# Patient Record
Sex: Female | Born: 1950 | Race: Black or African American | Hispanic: No | Smoking: Former smoker
Health system: Southern US, Community
[De-identification: ages and names within clinical notes are randomized; demographics above are authoritative.]

## PROBLEM LIST (undated history)

## (undated) DIAGNOSIS — E119 Type 2 diabetes mellitus without complications: Secondary | ICD-10-CM

## (undated) DIAGNOSIS — Z5189 Encounter for other specified aftercare: Secondary | ICD-10-CM

## (undated) DIAGNOSIS — Z8041 Family history of malignant neoplasm of ovary: Secondary | ICD-10-CM

## (undated) DIAGNOSIS — Z789 Other specified health status: Secondary | ICD-10-CM

## (undated) DIAGNOSIS — C50919 Malignant neoplasm of unspecified site of unspecified female breast: Secondary | ICD-10-CM

## (undated) DIAGNOSIS — Z800 Family history of malignant neoplasm of digestive organs: Secondary | ICD-10-CM

## (undated) DIAGNOSIS — I10 Essential (primary) hypertension: Secondary | ICD-10-CM

## (undated) HISTORY — PX: TUBAL LIGATION: SHX77

## (undated) HISTORY — DX: Encounter for other specified aftercare: Z51.89

## (undated) HISTORY — DX: Other specified health status: Z78.9

## (undated) HISTORY — DX: Essential (primary) hypertension: I10

## (undated) HISTORY — DX: Family history of malignant neoplasm of ovary: Z80.41

## (undated) HISTORY — DX: Family history of malignant neoplasm of digestive organs: Z80.0

## (undated) HISTORY — DX: Malignant neoplasm of unspecified site of unspecified female breast: C50.919

## (undated) HISTORY — PX: COLONOSCOPY: SHX174

## (undated) HISTORY — DX: Type 2 diabetes mellitus without complications: E11.9

---

## 1998-11-30 HISTORY — PX: FOOT SURGERY: SHX648

## 1999-01-28 ENCOUNTER — Other Ambulatory Visit: Admission: RE | Admit: 1999-01-28 | Discharge: 1999-01-28 | Payer: Self-pay | Admitting: Family Medicine

## 1999-04-14 ENCOUNTER — Ambulatory Visit (HOSPITAL_COMMUNITY): Admission: RE | Admit: 1999-04-14 | Discharge: 1999-04-14 | Payer: Self-pay | Admitting: Family Medicine

## 1999-04-14 ENCOUNTER — Encounter: Payer: Self-pay | Admitting: Family Medicine

## 2000-04-20 ENCOUNTER — Encounter: Payer: Self-pay | Admitting: Family Medicine

## 2000-04-20 ENCOUNTER — Ambulatory Visit (HOSPITAL_COMMUNITY): Admission: RE | Admit: 2000-04-20 | Discharge: 2000-04-20 | Payer: Self-pay | Admitting: Family Medicine

## 2000-07-08 ENCOUNTER — Other Ambulatory Visit: Admission: RE | Admit: 2000-07-08 | Discharge: 2000-07-08 | Payer: Self-pay | Admitting: Family Medicine

## 2001-06-30 DIAGNOSIS — C50919 Malignant neoplasm of unspecified site of unspecified female breast: Secondary | ICD-10-CM

## 2001-06-30 HISTORY — DX: Malignant neoplasm of unspecified site of unspecified female breast: C50.919

## 2001-09-16 ENCOUNTER — Encounter: Payer: Self-pay | Admitting: Family Medicine

## 2001-09-16 ENCOUNTER — Ambulatory Visit (HOSPITAL_COMMUNITY): Admission: RE | Admit: 2001-09-16 | Discharge: 2001-09-16 | Payer: Self-pay | Admitting: Family Medicine

## 2001-11-30 HISTORY — PX: BREAST SURGERY: SHX581

## 2002-08-30 ENCOUNTER — Other Ambulatory Visit: Admission: RE | Admit: 2002-08-30 | Discharge: 2002-08-30 | Payer: Self-pay | Admitting: Radiology

## 2002-08-30 ENCOUNTER — Encounter: Admission: RE | Admit: 2002-08-30 | Discharge: 2002-08-30 | Payer: Self-pay | Admitting: Internal Medicine

## 2002-08-30 ENCOUNTER — Encounter: Payer: Self-pay | Admitting: Internal Medicine

## 2002-08-30 ENCOUNTER — Encounter (INDEPENDENT_AMBULATORY_CARE_PROVIDER_SITE_OTHER): Payer: Self-pay | Admitting: Specialist

## 2002-09-08 ENCOUNTER — Ambulatory Visit (HOSPITAL_BASED_OUTPATIENT_CLINIC_OR_DEPARTMENT_OTHER): Admission: RE | Admit: 2002-09-08 | Discharge: 2002-09-08 | Payer: Self-pay | Admitting: *Deleted

## 2002-09-11 ENCOUNTER — Encounter: Payer: Self-pay | Admitting: *Deleted

## 2002-09-11 ENCOUNTER — Ambulatory Visit (HOSPITAL_COMMUNITY): Admission: RE | Admit: 2002-09-11 | Discharge: 2002-09-11 | Payer: Self-pay | Admitting: *Deleted

## 2002-09-12 ENCOUNTER — Ambulatory Visit (HOSPITAL_COMMUNITY): Admission: RE | Admit: 2002-09-12 | Discharge: 2002-09-12 | Payer: Self-pay | Admitting: *Deleted

## 2002-09-12 ENCOUNTER — Encounter: Payer: Self-pay | Admitting: *Deleted

## 2002-09-14 ENCOUNTER — Encounter: Payer: Self-pay | Admitting: *Deleted

## 2002-09-14 ENCOUNTER — Ambulatory Visit (HOSPITAL_COMMUNITY): Admission: RE | Admit: 2002-09-14 | Discharge: 2002-09-14 | Payer: Self-pay | Admitting: *Deleted

## 2002-10-19 ENCOUNTER — Encounter: Payer: Self-pay | Admitting: *Deleted

## 2002-10-19 ENCOUNTER — Encounter: Admission: RE | Admit: 2002-10-19 | Discharge: 2002-10-19 | Payer: Self-pay | Admitting: *Deleted

## 2003-01-10 ENCOUNTER — Encounter: Admission: RE | Admit: 2003-01-10 | Discharge: 2003-01-10 | Payer: Self-pay | Admitting: *Deleted

## 2003-01-10 ENCOUNTER — Ambulatory Visit (HOSPITAL_BASED_OUTPATIENT_CLINIC_OR_DEPARTMENT_OTHER): Admission: RE | Admit: 2003-01-10 | Discharge: 2003-01-11 | Payer: Self-pay | Admitting: *Deleted

## 2003-01-10 ENCOUNTER — Encounter (INDEPENDENT_AMBULATORY_CARE_PROVIDER_SITE_OTHER): Payer: Self-pay | Admitting: Specialist

## 2003-01-26 ENCOUNTER — Ambulatory Visit: Admission: RE | Admit: 2003-01-26 | Discharge: 2003-02-12 | Payer: Self-pay | Admitting: Radiation Oncology

## 2003-02-23 ENCOUNTER — Ambulatory Visit: Admission: RE | Admit: 2003-02-23 | Discharge: 2003-04-25 | Payer: Self-pay | Admitting: Family Medicine

## 2003-05-10 ENCOUNTER — Encounter: Admission: RE | Admit: 2003-05-10 | Discharge: 2003-05-10 | Payer: Self-pay | Admitting: *Deleted

## 2003-05-31 ENCOUNTER — Encounter: Admission: RE | Admit: 2003-05-31 | Discharge: 2003-08-29 | Payer: Self-pay | Admitting: Radiation Oncology

## 2003-05-31 ENCOUNTER — Ambulatory Visit: Admission: RE | Admit: 2003-05-31 | Discharge: 2003-05-31 | Payer: Self-pay | Admitting: Radiation Oncology

## 2003-09-04 ENCOUNTER — Encounter: Payer: Self-pay | Admitting: Oncology

## 2003-09-04 ENCOUNTER — Encounter: Admission: RE | Admit: 2003-09-04 | Discharge: 2003-09-04 | Payer: Self-pay | Admitting: Oncology

## 2003-12-13 ENCOUNTER — Ambulatory Visit (HOSPITAL_COMMUNITY): Admission: RE | Admit: 2003-12-13 | Discharge: 2003-12-13 | Payer: Self-pay | Admitting: Oncology

## 2003-12-14 ENCOUNTER — Ambulatory Visit (HOSPITAL_COMMUNITY): Admission: RE | Admit: 2003-12-14 | Discharge: 2003-12-14 | Payer: Self-pay | Admitting: Oncology

## 2004-01-03 ENCOUNTER — Ambulatory Visit: Admission: RE | Admit: 2004-01-03 | Discharge: 2004-01-03 | Payer: Self-pay | Admitting: Radiation Oncology

## 2004-04-08 ENCOUNTER — Encounter: Admission: RE | Admit: 2004-04-08 | Discharge: 2004-04-08 | Payer: Self-pay | Admitting: Oncology

## 2004-04-09 ENCOUNTER — Ambulatory Visit (HOSPITAL_COMMUNITY): Admission: RE | Admit: 2004-04-09 | Discharge: 2004-04-09 | Payer: Self-pay | Admitting: Oncology

## 2004-04-24 ENCOUNTER — Emergency Department (HOSPITAL_COMMUNITY): Admission: EM | Admit: 2004-04-24 | Discharge: 2004-04-24 | Payer: Self-pay | Admitting: Emergency Medicine

## 2004-08-22 ENCOUNTER — Ambulatory Visit (HOSPITAL_BASED_OUTPATIENT_CLINIC_OR_DEPARTMENT_OTHER): Admission: RE | Admit: 2004-08-22 | Discharge: 2004-08-22 | Payer: Self-pay | Admitting: *Deleted

## 2004-09-18 ENCOUNTER — Encounter: Admission: RE | Admit: 2004-09-18 | Discharge: 2004-09-18 | Payer: Self-pay | Admitting: Oncology

## 2004-10-04 ENCOUNTER — Ambulatory Visit: Payer: Self-pay | Admitting: Oncology

## 2004-12-29 ENCOUNTER — Ambulatory Visit (HOSPITAL_COMMUNITY): Admission: RE | Admit: 2004-12-29 | Discharge: 2004-12-29 | Payer: Self-pay | Admitting: Oncology

## 2004-12-29 ENCOUNTER — Ambulatory Visit: Payer: Self-pay | Admitting: Oncology

## 2005-06-10 ENCOUNTER — Ambulatory Visit: Payer: Self-pay | Admitting: Hematology and Oncology

## 2005-09-02 ENCOUNTER — Ambulatory Visit: Payer: Self-pay | Admitting: Hematology and Oncology

## 2006-02-03 ENCOUNTER — Encounter: Admission: RE | Admit: 2006-02-03 | Discharge: 2006-02-03 | Payer: Self-pay | Admitting: Internal Medicine

## 2006-03-10 ENCOUNTER — Ambulatory Visit: Payer: Self-pay | Admitting: Hematology and Oncology

## 2006-03-17 LAB — CBC WITH DIFFERENTIAL/PLATELET
BASO%: 0.5 % (ref 0.0–2.0)
Basophils Absolute: 0 10*3/uL (ref 0.0–0.1)
EOS%: 3.3 % (ref 0.0–7.0)
Eosinophils Absolute: 0.3 10*3/uL (ref 0.0–0.5)
HCT: 40.6 % (ref 34.8–46.6)
HGB: 13.7 g/dL (ref 11.6–15.9)
LYMPH%: 33.2 % (ref 14.0–48.0)
MCH: 33.3 pg (ref 26.0–34.0)
MCHC: 33.8 g/dL (ref 32.0–36.0)
MCV: 98.5 fL (ref 81.0–101.0)
MONO#: 0.5 10*3/uL (ref 0.1–0.9)
MONO%: 6.3 % (ref 0.0–13.0)
NEUT#: 5 10*3/uL (ref 1.5–6.5)
NEUT%: 56.7 % (ref 39.6–76.8)
Platelets: 396 10*3/uL (ref 145–400)
RBC: 4.12 10*6/uL (ref 3.70–5.32)
RDW: 12.7 % (ref 11.3–14.5)
WBC: 8.8 10*3/uL (ref 3.9–10.0)
lymph#: 2.9 10*3/uL (ref 0.9–3.3)

## 2006-03-19 LAB — COMPREHENSIVE METABOLIC PANEL
ALT: 64 U/L — ABNORMAL HIGH (ref 0–40)
AST: 64 U/L — ABNORMAL HIGH (ref 0–37)
Albumin: 4.2 g/dL (ref 3.5–5.2)
Alkaline Phosphatase: 80 U/L (ref 39–117)
BUN: 9 mg/dL (ref 6–23)
CO2: 26 mEq/L (ref 19–32)
Calcium: 9.4 mg/dL (ref 8.4–10.5)
Chloride: 103 mEq/L (ref 96–112)
Creatinine, Ser: 0.7 mg/dL (ref 0.4–1.2)
Glucose, Bld: 182 mg/dL — ABNORMAL HIGH (ref 70–99)
Potassium: 3.9 mEq/L (ref 3.5–5.3)
Sodium: 139 mEq/L (ref 135–145)
Total Bilirubin: 0.6 mg/dL (ref 0.3–1.2)
Total Protein: 8 g/dL (ref 6.0–8.3)

## 2006-03-19 LAB — CANCER ANTIGEN 27.29: CA 27.29: 10 U/mL (ref 0–39)

## 2006-03-19 LAB — LACTATE DEHYDROGENASE: LDH: 219 U/L (ref 94–250)

## 2006-09-13 ENCOUNTER — Ambulatory Visit: Payer: Self-pay | Admitting: Hematology and Oncology

## 2006-09-15 ENCOUNTER — Ambulatory Visit (HOSPITAL_COMMUNITY): Admission: RE | Admit: 2006-09-15 | Discharge: 2006-09-15 | Payer: Self-pay | Admitting: Hematology and Oncology

## 2006-12-09 ENCOUNTER — Encounter: Admission: RE | Admit: 2006-12-09 | Discharge: 2006-12-09 | Payer: Self-pay | Admitting: Hematology and Oncology

## 2006-12-17 ENCOUNTER — Encounter: Admission: RE | Admit: 2006-12-17 | Discharge: 2006-12-17 | Payer: Self-pay | Admitting: Hematology and Oncology

## 2007-03-07 ENCOUNTER — Ambulatory Visit: Payer: Self-pay | Admitting: Hematology and Oncology

## 2007-03-18 LAB — CBC WITH DIFFERENTIAL/PLATELET
BASO%: 0.5 % (ref 0.0–2.0)
Basophils Absolute: 0 10*3/uL (ref 0.0–0.1)
EOS%: 2.5 % (ref 0.0–7.0)
Eosinophils Absolute: 0.3 10*3/uL (ref 0.0–0.5)
HCT: 38.2 % (ref 34.8–46.6)
HGB: 13.4 g/dL (ref 11.6–15.9)
LYMPH%: 31.4 % (ref 14.0–48.0)
MCH: 33.9 pg (ref 26.0–34.0)
MCHC: 35.1 g/dL (ref 32.0–36.0)
MCV: 96.4 fL (ref 81.0–101.0)
MONO#: 0.6 10*3/uL (ref 0.1–0.9)
MONO%: 5.6 % (ref 0.0–13.0)
NEUT#: 6 10*3/uL (ref 1.5–6.5)
NEUT%: 60 % (ref 39.6–76.8)
Platelets: 407 10*3/uL — ABNORMAL HIGH (ref 145–400)
RBC: 3.96 10*6/uL (ref 3.70–5.32)
RDW: 12.7 % (ref 11.3–14.5)
WBC: 10 10*3/uL (ref 3.9–10.0)
lymph#: 3.1 10*3/uL (ref 0.9–3.3)

## 2007-03-18 LAB — COMPREHENSIVE METABOLIC PANEL
ALT: 22 U/L (ref 0–35)
AST: 19 U/L (ref 0–37)
Albumin: 4 g/dL (ref 3.5–5.2)
Alkaline Phosphatase: 68 U/L (ref 39–117)
BUN: 13 mg/dL (ref 6–23)
CO2: 27 mEq/L (ref 19–32)
Calcium: 9.1 mg/dL (ref 8.4–10.5)
Chloride: 103 mEq/L (ref 96–112)
Creatinine, Ser: 0.74 mg/dL (ref 0.40–1.20)
Glucose, Bld: 84 mg/dL (ref 70–99)
Potassium: 3.8 mEq/L (ref 3.5–5.3)
Sodium: 142 mEq/L (ref 135–145)
Total Bilirubin: 0.4 mg/dL (ref 0.3–1.2)
Total Protein: 7.3 g/dL (ref 6.0–8.3)

## 2007-03-18 LAB — LACTATE DEHYDROGENASE: LDH: 162 U/L (ref 94–250)

## 2007-03-18 LAB — CANCER ANTIGEN 27.29: CA 27.29: 12 U/mL (ref 0–39)

## 2007-09-13 ENCOUNTER — Ambulatory Visit: Payer: Self-pay | Admitting: Hematology and Oncology

## 2007-09-23 LAB — COMPREHENSIVE METABOLIC PANEL
ALT: 24 U/L (ref 0–35)
AST: 23 U/L (ref 0–37)
Albumin: 4.4 g/dL (ref 3.5–5.2)
Alkaline Phosphatase: 67 U/L (ref 39–117)
BUN: 12 mg/dL (ref 6–23)
CO2: 25 mEq/L (ref 19–32)
Calcium: 9.1 mg/dL (ref 8.4–10.5)
Chloride: 104 mEq/L (ref 96–112)
Creatinine, Ser: 0.71 mg/dL (ref 0.40–1.20)
Glucose, Bld: 115 mg/dL — ABNORMAL HIGH (ref 70–99)
Potassium: 3.6 mEq/L (ref 3.5–5.3)
Sodium: 140 mEq/L (ref 135–145)
Total Bilirubin: 0.5 mg/dL (ref 0.3–1.2)
Total Protein: 7.8 g/dL (ref 6.0–8.3)

## 2007-09-23 LAB — CBC WITH DIFFERENTIAL/PLATELET
BASO%: 0.5 % (ref 0.0–2.0)
Basophils Absolute: 0 10*3/uL (ref 0.0–0.1)
EOS%: 2 % (ref 0.0–7.0)
Eosinophils Absolute: 0.2 10*3/uL (ref 0.0–0.5)
HCT: 38.3 % (ref 34.8–46.6)
HGB: 13.1 g/dL (ref 11.6–15.9)
LYMPH%: 31.6 % (ref 14.0–48.0)
MCH: 33 pg (ref 26.0–34.0)
MCHC: 34.3 g/dL (ref 32.0–36.0)
MCV: 96.3 fL (ref 81.0–101.0)
MONO#: 0.5 10*3/uL (ref 0.1–0.9)
MONO%: 5.5 % (ref 0.0–13.0)
NEUT#: 5.7 10*3/uL (ref 1.5–6.5)
NEUT%: 60.4 % (ref 39.6–76.8)
Platelets: 435 10*3/uL — ABNORMAL HIGH (ref 145–400)
RBC: 3.98 10*6/uL (ref 3.70–5.32)
RDW: 13.3 % (ref 11.3–14.5)
WBC: 9.4 10*3/uL (ref 3.9–10.0)
lymph#: 3 10*3/uL (ref 0.9–3.3)

## 2007-09-23 LAB — LACTATE DEHYDROGENASE: LDH: 184 U/L (ref 94–250)

## 2007-09-23 LAB — CANCER ANTIGEN 27.29: CA 27.29: 10 U/mL (ref 0–39)

## 2007-12-13 ENCOUNTER — Encounter: Admission: RE | Admit: 2007-12-13 | Discharge: 2007-12-13 | Payer: Self-pay | Admitting: Internal Medicine

## 2007-12-16 ENCOUNTER — Encounter (INDEPENDENT_AMBULATORY_CARE_PROVIDER_SITE_OTHER): Payer: Self-pay | Admitting: Diagnostic Radiology

## 2007-12-16 ENCOUNTER — Encounter: Admission: RE | Admit: 2007-12-16 | Discharge: 2007-12-16 | Payer: Self-pay | Admitting: Internal Medicine

## 2008-03-14 ENCOUNTER — Ambulatory Visit: Payer: Self-pay | Admitting: Hematology and Oncology

## 2008-03-16 LAB — COMPREHENSIVE METABOLIC PANEL
ALT: 25 U/L (ref 0–35)
AST: 23 U/L (ref 0–37)
Albumin: 4.1 g/dL (ref 3.5–5.2)
Alkaline Phosphatase: 60 U/L (ref 39–117)
BUN: 14 mg/dL (ref 6–23)
CO2: 26 mEq/L (ref 19–32)
Calcium: 9.1 mg/dL (ref 8.4–10.5)
Chloride: 106 mEq/L (ref 96–112)
Creatinine, Ser: 0.75 mg/dL (ref 0.40–1.20)
Glucose, Bld: 96 mg/dL (ref 70–99)
Potassium: 4 mEq/L (ref 3.5–5.3)
Sodium: 142 mEq/L (ref 135–145)
Total Bilirubin: 0.6 mg/dL (ref 0.3–1.2)
Total Protein: 7.6 g/dL (ref 6.0–8.3)

## 2008-03-16 LAB — CBC WITH DIFFERENTIAL/PLATELET
BASO%: 0 % (ref 0.0–2.0)
Basophils Absolute: 0 10*3/uL (ref 0.0–0.1)
EOS%: 2.9 % (ref 0.0–7.0)
Eosinophils Absolute: 0.2 10*3/uL (ref 0.0–0.5)
HCT: 38.5 % (ref 34.8–46.6)
HGB: 13.3 g/dL (ref 11.6–15.9)
LYMPH%: 33.7 % (ref 14.0–48.0)
MCH: 33 pg (ref 26.0–34.0)
MCHC: 34.5 g/dL (ref 32.0–36.0)
MCV: 95.7 fL (ref 81.0–101.0)
MONO#: 0.4 10*3/uL (ref 0.1–0.9)
MONO%: 4.7 % (ref 0.0–13.0)
NEUT#: 4.7 10*3/uL (ref 1.5–6.5)
NEUT%: 58.7 % (ref 39.6–76.8)
Platelets: 394 10*3/uL (ref 145–400)
RBC: 4.03 10*6/uL (ref 3.70–5.32)
RDW: 12.8 % (ref 11.3–14.5)
WBC: 7.9 10*3/uL (ref 3.9–10.0)
lymph#: 2.7 10*3/uL (ref 0.9–3.3)

## 2008-03-16 LAB — LACTATE DEHYDROGENASE: LDH: 169 U/L (ref 94–250)

## 2008-03-16 LAB — CANCER ANTIGEN 27.29: CA 27.29: 15 U/mL (ref 0–39)

## 2008-09-14 ENCOUNTER — Ambulatory Visit: Payer: Self-pay | Admitting: Hematology and Oncology

## 2008-09-18 ENCOUNTER — Ambulatory Visit (HOSPITAL_COMMUNITY): Admission: RE | Admit: 2008-09-18 | Discharge: 2008-09-18 | Payer: Self-pay | Admitting: Hematology and Oncology

## 2008-09-28 LAB — CBC WITH DIFFERENTIAL/PLATELET
BASO%: 0.4 % (ref 0.0–2.0)
Basophils Absolute: 0 10*3/uL (ref 0.0–0.1)
EOS%: 3.6 % (ref 0.0–7.0)
Eosinophils Absolute: 0.3 10*3/uL (ref 0.0–0.5)
HCT: 37.8 % (ref 34.8–46.6)
HGB: 12.8 g/dL (ref 11.6–15.9)
LYMPH%: 34.8 % (ref 14.0–48.0)
MCH: 32.7 pg (ref 26.0–34.0)
MCHC: 33.8 g/dL (ref 32.0–36.0)
MCV: 96.7 fL (ref 81.0–101.0)
MONO#: 0.5 10*3/uL (ref 0.1–0.9)
MONO%: 6.6 % (ref 0.0–13.0)
NEUT#: 4.5 10*3/uL (ref 1.5–6.5)
NEUT%: 54.6 % (ref 39.6–76.8)
Platelets: 360 10*3/uL (ref 145–400)
RBC: 3.91 10*6/uL (ref 3.70–5.32)
RDW: 13.1 % (ref 11.3–14.5)
WBC: 8.2 10*3/uL (ref 3.9–10.0)
lymph#: 2.8 10*3/uL (ref 0.9–3.3)

## 2008-09-28 LAB — CANCER ANTIGEN 27.29: CA 27.29: 12 U/mL (ref 0–39)

## 2008-09-28 LAB — COMPREHENSIVE METABOLIC PANEL
ALT: 25 U/L (ref 0–35)
AST: 23 U/L (ref 0–37)
Albumin: 4.2 g/dL (ref 3.5–5.2)
Alkaline Phosphatase: 62 U/L (ref 39–117)
BUN: 12 mg/dL (ref 6–23)
CO2: 28 mEq/L (ref 19–32)
Calcium: 9.5 mg/dL (ref 8.4–10.5)
Chloride: 103 mEq/L (ref 96–112)
Creatinine, Ser: 0.72 mg/dL (ref 0.40–1.20)
Glucose, Bld: 70 mg/dL (ref 70–99)
Potassium: 3.8 mEq/L (ref 3.5–5.3)
Sodium: 139 mEq/L (ref 135–145)
Total Bilirubin: 0.5 mg/dL (ref 0.3–1.2)
Total Protein: 7.7 g/dL (ref 6.0–8.3)

## 2008-09-28 LAB — LACTATE DEHYDROGENASE: LDH: 166 U/L (ref 94–250)

## 2008-12-27 ENCOUNTER — Encounter: Admission: RE | Admit: 2008-12-27 | Discharge: 2008-12-27 | Payer: Self-pay | Admitting: Internal Medicine

## 2009-03-21 ENCOUNTER — Ambulatory Visit: Payer: Self-pay | Admitting: Hematology and Oncology

## 2010-01-06 ENCOUNTER — Encounter: Admission: RE | Admit: 2010-01-06 | Discharge: 2010-01-06 | Payer: Self-pay | Admitting: Internal Medicine

## 2010-01-20 ENCOUNTER — Ambulatory Visit: Payer: Self-pay | Admitting: Hematology and Oncology

## 2010-03-25 ENCOUNTER — Ambulatory Visit: Payer: Self-pay | Admitting: Hematology and Oncology

## 2010-03-26 LAB — CBC WITH DIFFERENTIAL/PLATELET
BASO%: 0.1 % (ref 0.0–2.0)
Basophils Absolute: 0 10*3/uL (ref 0.0–0.1)
EOS%: 2.2 % (ref 0.0–7.0)
Eosinophils Absolute: 0.2 10*3/uL (ref 0.0–0.5)
HCT: 37.1 % (ref 34.8–46.6)
HGB: 12.5 g/dL (ref 11.6–15.9)
LYMPH%: 27.7 % (ref 14.0–49.7)
MCH: 31.8 pg (ref 25.1–34.0)
MCHC: 33.7 g/dL (ref 31.5–36.0)
MCV: 94.4 fL (ref 79.5–101.0)
MONO#: 0.5 10*3/uL (ref 0.1–0.9)
MONO%: 5.5 % (ref 0.0–14.0)
NEUT#: 5.4 10*3/uL (ref 1.5–6.5)
NEUT%: 64.5 % (ref 38.4–76.8)
Platelets: 320 10*3/uL (ref 145–400)
RBC: 3.93 10*6/uL (ref 3.70–5.45)
RDW: 12.9 % (ref 11.2–14.5)
WBC: 8.3 10*3/uL (ref 3.9–10.3)
lymph#: 2.3 10*3/uL (ref 0.9–3.3)
nRBC: 0 % (ref 0–0)

## 2010-03-26 LAB — COMPREHENSIVE METABOLIC PANEL
ALT: 29 U/L (ref 0–35)
AST: 23 U/L (ref 0–37)
Albumin: 4.2 g/dL (ref 3.5–5.2)
Alkaline Phosphatase: 60 U/L (ref 39–117)
BUN: 20 mg/dL (ref 6–23)
CO2: 26 mEq/L (ref 19–32)
Calcium: 9.5 mg/dL (ref 8.4–10.5)
Chloride: 103 mEq/L (ref 96–112)
Creatinine, Ser: 0.75 mg/dL (ref 0.40–1.20)
Glucose, Bld: 123 mg/dL — ABNORMAL HIGH (ref 70–99)
Potassium: 4 mEq/L (ref 3.5–5.3)
Sodium: 138 mEq/L (ref 135–145)
Total Bilirubin: 0.5 mg/dL (ref 0.3–1.2)
Total Protein: 7.3 g/dL (ref 6.0–8.3)

## 2010-03-26 LAB — CANCER ANTIGEN 27.29: CA 27.29: 6 U/mL (ref 0–39)

## 2010-03-26 LAB — LACTATE DEHYDROGENASE: LDH: 150 U/L (ref 94–250)

## 2010-10-24 ENCOUNTER — Ambulatory Visit: Payer: Self-pay | Admitting: Hematology and Oncology

## 2010-10-28 ENCOUNTER — Ambulatory Visit (HOSPITAL_COMMUNITY)
Admission: RE | Admit: 2010-10-28 | Discharge: 2010-10-28 | Payer: Self-pay | Source: Home / Self Care | Admitting: Hematology and Oncology

## 2010-10-28 LAB — CBC WITH DIFFERENTIAL/PLATELET
BASO%: 0.6 % (ref 0.0–2.0)
Basophils Absolute: 0 10*3/uL (ref 0.0–0.1)
EOS%: 2.6 % (ref 0.0–7.0)
Eosinophils Absolute: 0.2 10*3/uL (ref 0.0–0.5)
HCT: 36.7 % (ref 34.8–46.6)
HGB: 12.6 g/dL (ref 11.6–15.9)
LYMPH%: 25.4 % (ref 14.0–49.7)
MCH: 33.4 pg (ref 25.1–34.0)
MCHC: 34.3 g/dL (ref 31.5–36.0)
MCV: 97.2 fL (ref 79.5–101.0)
MONO#: 0.3 10*3/uL (ref 0.1–0.9)
MONO%: 3.3 % (ref 0.0–14.0)
NEUT#: 5.7 10*3/uL (ref 1.5–6.5)
NEUT%: 68.1 % (ref 38.4–76.8)
Platelets: 380 10*3/uL (ref 145–400)
RBC: 3.77 10*6/uL (ref 3.70–5.45)
RDW: 13.2 % (ref 11.2–14.5)
WBC: 8.4 10*3/uL (ref 3.9–10.3)
lymph#: 2.1 10*3/uL (ref 0.9–3.3)

## 2010-10-28 LAB — COMPREHENSIVE METABOLIC PANEL
ALT: 31 U/L (ref 0–35)
AST: 27 U/L (ref 0–37)
Albumin: 4.2 g/dL (ref 3.5–5.2)
Alkaline Phosphatase: 63 U/L (ref 39–117)
BUN: 16 mg/dL (ref 6–23)
CO2: 27 mEq/L (ref 19–32)
Calcium: 9.5 mg/dL (ref 8.4–10.5)
Chloride: 103 mEq/L (ref 96–112)
Creatinine, Ser: 0.77 mg/dL (ref 0.40–1.20)
Glucose, Bld: 166 mg/dL — ABNORMAL HIGH (ref 70–99)
Potassium: 4.2 mEq/L (ref 3.5–5.3)
Sodium: 140 mEq/L (ref 135–145)
Total Bilirubin: 0.4 mg/dL (ref 0.3–1.2)
Total Protein: 7.3 g/dL (ref 6.0–8.3)

## 2010-10-28 LAB — LACTATE DEHYDROGENASE: LDH: 168 U/L (ref 94–250)

## 2010-10-28 LAB — CANCER ANTIGEN 27.29: CA 27.29: <4 U/mL (ref 0–39)

## 2010-12-20 ENCOUNTER — Other Ambulatory Visit: Payer: Self-pay | Admitting: Hematology and Oncology

## 2010-12-20 ENCOUNTER — Encounter: Payer: Self-pay | Admitting: Oncology

## 2010-12-20 DIAGNOSIS — Z1231 Encounter for screening mammogram for malignant neoplasm of breast: Secondary | ICD-10-CM

## 2011-01-09 ENCOUNTER — Ambulatory Visit
Admission: RE | Admit: 2011-01-09 | Discharge: 2011-01-09 | Disposition: A | Discharge status: Home / Self Care | Payer: BLUE CROSS/BLUE SHIELD | Source: Ambulatory Visit | Attending: Hematology and Oncology | Admitting: Hematology and Oncology

## 2011-01-09 DIAGNOSIS — Z1231 Encounter for screening mammogram for malignant neoplasm of breast: Secondary | ICD-10-CM

## 2011-04-17 NOTE — Op Note (Signed)
Lindsey Marquez, HOUSEMAN NO.:  [id number]   MEDICAL RECORD NO.:  [id number]          PATIENT TYPE:  AMB   LOCATION:  DSC                          FACILITY:  [facility]   PHYSICIAN:  Vikki Ports, M.D.DATE OF BIRTH:  [birth year]   DATE OF PROCEDURE:  08/22/2004  DATE OF DISCHARGE:                                 OPERATIVE REPORT   PREOPERATIVE DIAGNOSIS:  History of invasive breast cancer.   POSTOPERATIVE DIAGNOSIS:  History of invasive breast cancer.   PROCEDURE:  Removal of Port-A-Cath.   SURGEON:  Vikki Ports, M.D.   ANESTHESIA:  Local.   DESCRIPTION OF PROCEDURE:  The patient was taken to the operating room and  placed in a supine position.  The right chest was prepped and draped in the  normal sterile fashion.  Using 1% lidocaine local anesthesia, the skin  overlying the Port-A-Cath was anesthetized.  A transverse incision was made,  dissected down onto the capsule.  I opened the capsule, grasped the Port-A-  Cath, cut the small Prolene sutures, and removed the Port-A-Cath.  Adequate  hemostasis was ensured, and the skin was closed with subcuticular 4-0  Monocryl.  Steri-Strips and sterile dressings were applied.  The patient  tolerated the procedure well and went to PACU in good condition.       KRH/MEDQ  D:  08/23/2004  T:  08/23/2004  Job:  [id number]

## 2011-04-29 ENCOUNTER — Other Ambulatory Visit: Payer: Self-pay | Admitting: Hematology and Oncology

## 2011-04-29 ENCOUNTER — Encounter (HOSPITAL_BASED_OUTPATIENT_CLINIC_OR_DEPARTMENT_OTHER): Payer: Medicare Other | Admitting: Hematology and Oncology

## 2011-04-29 DIAGNOSIS — Z171 Estrogen receptor negative status [ER-]: Secondary | ICD-10-CM

## 2011-04-29 DIAGNOSIS — C50419 Malignant neoplasm of upper-outer quadrant of unspecified female breast: Secondary | ICD-10-CM

## 2011-04-29 DIAGNOSIS — Z23 Encounter for immunization: Secondary | ICD-10-CM

## 2011-04-29 LAB — CBC WITH DIFFERENTIAL/PLATELET
BASO%: 0.5 % (ref 0.0–2.0)
Basophils Absolute: 0 10*3/uL (ref 0.0–0.1)
EOS%: 2.8 % (ref 0.0–7.0)
Eosinophils Absolute: 0.2 10*3/uL (ref 0.0–0.5)
HCT: 37.5 % (ref 34.8–46.6)
HGB: 12.6 g/dL (ref 11.6–15.9)
LYMPH%: 29.9 % (ref 14.0–49.7)
MCH: 33 pg (ref 25.1–34.0)
MCHC: 33.8 g/dL (ref 31.5–36.0)
MCV: 97.8 fL (ref 79.5–101.0)
MONO#: 0.4 10*3/uL (ref 0.1–0.9)
MONO%: 5.5 % (ref 0.0–14.0)
NEUT#: 4.9 10*3/uL (ref 1.5–6.5)
NEUT%: 61.3 % (ref 38.4–76.8)
Platelets: 359 10*3/uL (ref 145–400)
RBC: 3.83 10*6/uL (ref 3.70–5.45)
RDW: 13.1 % (ref 11.2–14.5)
WBC: 8.1 10*3/uL (ref 3.9–10.3)
lymph#: 2.4 10*3/uL (ref 0.9–3.3)

## 2011-04-29 LAB — COMPREHENSIVE METABOLIC PANEL
ALT: 37 U/L — ABNORMAL HIGH (ref 0–35)
AST: 32 U/L (ref 0–37)
Albumin: 4 g/dL (ref 3.5–5.2)
Alkaline Phosphatase: 56 U/L (ref 39–117)
BUN: 17 mg/dL (ref 6–23)
CO2: 24 mEq/L (ref 19–32)
Calcium: 9.7 mg/dL (ref 8.4–10.5)
Chloride: 104 mEq/L (ref 96–112)
Creatinine, Ser: 0.77 mg/dL (ref 0.40–1.20)
Glucose, Bld: 154 mg/dL — ABNORMAL HIGH (ref 70–99)
Potassium: 4.5 mEq/L (ref 3.5–5.3)
Sodium: 139 mEq/L (ref 135–145)
Total Bilirubin: 0.4 mg/dL (ref 0.3–1.2)
Total Protein: 7.1 g/dL (ref 6.0–8.3)

## 2011-04-29 LAB — CANCER ANTIGEN 27.29: CA 27.29: 9 U/mL (ref 0–39)

## 2011-04-29 LAB — LACTATE DEHYDROGENASE: LDH: 149 U/L (ref 94–250)

## 2011-05-01 ENCOUNTER — Encounter (HOSPITAL_BASED_OUTPATIENT_CLINIC_OR_DEPARTMENT_OTHER): Payer: Medicare Other | Admitting: Hematology and Oncology

## 2011-05-01 DIAGNOSIS — Z853 Personal history of malignant neoplasm of breast: Secondary | ICD-10-CM

## 2011-05-08 LAB — HM COLONOSCOPY

## 2011-10-31 ENCOUNTER — Telehealth: Payer: Self-pay | Admitting: Hematology and Oncology

## 2011-10-31 NOTE — Telephone Encounter (Signed)
S/w the pt and she is aware of the jan 2013 appts

## 2011-11-27 ENCOUNTER — Encounter: Payer: Self-pay | Admitting: *Deleted

## 2011-12-02 ENCOUNTER — Other Ambulatory Visit: Payer: Self-pay | Admitting: Internal Medicine

## 2011-12-02 DIAGNOSIS — Z1231 Encounter for screening mammogram for malignant neoplasm of breast: Secondary | ICD-10-CM

## 2011-12-03 ENCOUNTER — Other Ambulatory Visit: Payer: Self-pay | Admitting: Hematology and Oncology

## 2011-12-03 ENCOUNTER — Other Ambulatory Visit (HOSPITAL_BASED_OUTPATIENT_CLINIC_OR_DEPARTMENT_OTHER): Payer: Medicare Other

## 2011-12-03 DIAGNOSIS — Z171 Estrogen receptor negative status [ER-]: Secondary | ICD-10-CM

## 2011-12-03 DIAGNOSIS — C50419 Malignant neoplasm of upper-outer quadrant of unspecified female breast: Secondary | ICD-10-CM

## 2011-12-03 DIAGNOSIS — Z23 Encounter for immunization: Secondary | ICD-10-CM

## 2011-12-03 LAB — CBC WITH DIFFERENTIAL/PLATELET
BASO%: 0.3 % (ref 0.0–2.0)
Basophils Absolute: 0 10*3/uL (ref 0.0–0.1)
EOS%: 5.3 % (ref 0.0–7.0)
Eosinophils Absolute: 0.4 10*3/uL (ref 0.0–0.5)
HCT: 35.6 % (ref 34.8–46.6)
HGB: 12.1 g/dL (ref 11.6–15.9)
LYMPH%: 26.7 % (ref 14.0–49.7)
MCH: 33.1 pg (ref 25.1–34.0)
MCHC: 34 g/dL (ref 31.5–36.0)
MCV: 97.4 fL (ref 79.5–101.0)
MONO#: 0.4 10*3/uL (ref 0.1–0.9)
MONO%: 5.2 % (ref 0.0–14.0)
NEUT#: 5.1 10*3/uL (ref 1.5–6.5)
NEUT%: 62.5 % (ref 38.4–76.8)
Platelets: 382 10*3/uL (ref 145–400)
RBC: 3.65 10*6/uL — ABNORMAL LOW (ref 3.70–5.45)
RDW: 13.2 % (ref 11.2–14.5)
WBC: 8.2 10*3/uL (ref 3.9–10.3)
lymph#: 2.2 10*3/uL (ref 0.9–3.3)

## 2011-12-03 LAB — COMPREHENSIVE METABOLIC PANEL
ALT: 50 U/L — ABNORMAL HIGH (ref 0–35)
AST: 40 U/L — ABNORMAL HIGH (ref 0–37)
Albumin: 4.4 g/dL (ref 3.5–5.2)
Alkaline Phosphatase: 58 U/L (ref 39–117)
BUN: 14 mg/dL (ref 6–23)
CO2: 27 mEq/L (ref 19–32)
Calcium: 9.5 mg/dL (ref 8.4–10.5)
Chloride: 102 mEq/L (ref 96–112)
Creatinine, Ser: 0.76 mg/dL (ref 0.50–1.10)
Glucose, Bld: 136 mg/dL — ABNORMAL HIGH (ref 70–99)
Potassium: 4.3 mEq/L (ref 3.5–5.3)
Sodium: 139 mEq/L (ref 135–145)
Total Bilirubin: 0.4 mg/dL (ref 0.3–1.2)
Total Protein: 7.4 g/dL (ref 6.0–8.3)

## 2011-12-03 LAB — CANCER ANTIGEN 27.29: CA 27.29: 11 U/mL (ref 0–39)

## 2011-12-03 LAB — LACTATE DEHYDROGENASE: LDH: 181 U/L (ref 94–250)

## 2011-12-08 ENCOUNTER — Ambulatory Visit: Payer: Medicare Other | Admitting: Hematology and Oncology

## 2011-12-08 ENCOUNTER — Telehealth: Payer: Self-pay | Admitting: Oncology

## 2011-12-08 ENCOUNTER — Ambulatory Visit (HOSPITAL_BASED_OUTPATIENT_CLINIC_OR_DEPARTMENT_OTHER): Payer: Medicare Other | Admitting: Physician Assistant

## 2011-12-08 VITALS — BP 169/88 | HR 100 | Temp 98.7°F | Ht 68.5 in | Wt 240.3 lb

## 2011-12-08 DIAGNOSIS — C50A0 Malignant inflammatory neoplasm of unspecified breast: Secondary | ICD-10-CM

## 2011-12-08 DIAGNOSIS — R7401 Elevation of levels of liver transaminase levels: Secondary | ICD-10-CM

## 2011-12-08 DIAGNOSIS — I10 Essential (primary) hypertension: Secondary | ICD-10-CM

## 2011-12-08 DIAGNOSIS — C50919 Malignant neoplasm of unspecified site of unspecified female breast: Secondary | ICD-10-CM

## 2011-12-08 DIAGNOSIS — Z853 Personal history of malignant neoplasm of breast: Secondary | ICD-10-CM | POA: Insufficient documentation

## 2011-12-08 NOTE — Telephone Encounter (Signed)
gv pt appt schedule jan 2014.

## 2011-12-08 NOTE — Progress Notes (Signed)
This office note has been dictated.

## 2011-12-08 NOTE — Progress Notes (Signed)
CC:   Merlene Laughter. Renae Gloss, M.D. Anselmo Rod, MD, Clementeen Graham  IDENTIFYING STATEMENT:  [name] is a 61 year old black female who presents for followup.  PROBLEM LIST: 1. 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     a.     Status post neoadjuvant chemotherapy with TAC x4 cycles.         I. Status post left lumpectomy with axillary lymph node             dissection.  Residual tumor 1 cm ER PR negative and HER-             2/neu negative.  Status post radiation therapy to the left             breast.         II.Adjuvant Cytoxan with 5-FU x4 cycles (methotrexate added at             cycle 4). 2. History of bilateral carpal tunnel syndrome.  INTERIM HISTORY:  [name] reports since her last clinic visit in June of 2012 that she did have a colonoscopy later in June which revealed some hemorrhoids, but no evidence of malignancy.  Currently she reports normal energy levels.  She has had no fevers, chills or night sweats. No dyspnea or cough.  She has normal appetite.  She has had no issues with nausea, vomiting, constipation or diarrhea.  No dysuria, no frequency or hematuria.  No alteration in sensation, or balance or swelling of extremities.  The patient does state that she will occasionally drink wine at night to help her relax.  She has had no changes in medications since her last clinic visit.  PHYSICAL EXAMINATION:  Temperature today is 98.7, heart rate 100, respirations 20, blood pressure 169/88, weight 240.3 pounds.  General: This is a well-developed, well-nourished black female in no acute distress.  HEENT:  Sclerae are nonicteric.  There is no oral thrush or mucositis.  Skin:  Without rashes or lesions.  Lymph:  No cervical, supraclavicular, axillary or inguinal lymphadenopathy.  Cardiac: Regular rate and rhythm without murmurs or gallops.  Peripheral pulses are 2+.  Chest:  Lungs clear to auscultation.  Breasts:  Bilateral breast exam without masses  or nipple discharge.  She does have hyperpigmentation noted left breast consistent with radiation changes. Abdomen:  Positive bowel sounds, soft, nontender, nondistended.  No organomegaly.  Extremities:  No edema, cyanosis or calf tenderness. Neurologic:  Alert and oriented x3.  Strength, sensation and coordination all grossly intact.  LABORATORY DATA:  Laboratory data from 12/03/2011 CBC with differential reveals white blood count of 8.2, hemoglobin 12.1, hematocrit 35.6, platelets 382, ANC 5.1, MCV 97.4.  Chemistries reveal a sodium of 139, potassium 4.3, chloride 102, BUN 14, creatinine 0.76, glucose 136, bilirubin 0.4, alkaline phosphatase 58, AST 40, ALT 50, total protein 7.4, albumin 4.4, calcium 9.5, LDH 181 and CA 27.29 of 11.  IMPRESSION/PLAN: 1. Lindsey Marquez is a 61 year old black female with a history of     inflammatory breast cancer treated with neoadjuvant chemotherapy     followed by lumpectomy for ER PR negative, HER-2/neu negative     tumor.  She also received external radiation therapy and 4 cycles     of adjuvant chemotherapy with no evidence of disease recurrence     since that time.  Her last mammogram was January 07, 2011 which     revealed no evidence of malignancy.  She will be due for repeat  mammograms in February 2013. 2. Patient with elevated blood pressure today.  She is on     antihypertensives and is advised to follow up with her primary     physician for further management. 3. The patient also has slightly elevated AST and ALT.  She does state     that she has been drinking wine at night to help her relax and     sleep.  She will have repeat labs when she follows up with her     primary physician for physical. 4. The patient will be scheduled per Dr. Dalene Carrow for followup in 1     years' time.  A few days before this, will reassess CBC with diff,     CMET, LDH and CA 27.29.  The patient is advised to call in the     interim if any questions or  problems.    ______________________________ Sherilyn Banker, MSN, ANP, BC RJ/MEDQ  D:  12/08/2011  T:  12/08/2011  Job:  [id number]

## 2012-01-15 ENCOUNTER — Ambulatory Visit
Admission: RE | Admit: 2012-01-15 | Discharge: 2012-01-15 | Disposition: A | Discharge status: Home / Self Care | Payer: Medicare Other | Source: Ambulatory Visit | Attending: Internal Medicine | Admitting: Internal Medicine

## 2012-01-15 DIAGNOSIS — Z1231 Encounter for screening mammogram for malignant neoplasm of breast: Secondary | ICD-10-CM

## 2012-11-19 ENCOUNTER — Telehealth: Payer: Self-pay | Admitting: Oncology

## 2012-11-19 NOTE — Telephone Encounter (Signed)
Called pt no answer machine could not leave message.

## 2012-11-19 NOTE — Telephone Encounter (Signed)
R/S appt to Dr. Gaylyn Rong 1/09 @ 10:30.

## 2012-12-01 ENCOUNTER — Telehealth: Payer: Self-pay | Admitting: Oncology

## 2012-12-01 NOTE — Telephone Encounter (Signed)
Former pt of LO reassigned to [facility]. Per pt appts for this week r/s to 1/13 for lb and 1/15 for [facility].

## 2012-12-03 ENCOUNTER — Encounter: Payer: Self-pay | Admitting: Oncology

## 2012-12-07 ENCOUNTER — Other Ambulatory Visit: Payer: Medicare Other | Admitting: Lab

## 2012-12-08 ENCOUNTER — Ambulatory Visit: Payer: Medicare Other | Admitting: Oncology

## 2012-12-09 ENCOUNTER — Ambulatory Visit: Payer: Medicare Other | Admitting: Hematology and Oncology

## 2012-12-12 ENCOUNTER — Other Ambulatory Visit (HOSPITAL_BASED_OUTPATIENT_CLINIC_OR_DEPARTMENT_OTHER): Payer: Medicare PPO | Admitting: Lab

## 2012-12-12 DIAGNOSIS — C50919 Malignant neoplasm of unspecified site of unspecified female breast: Secondary | ICD-10-CM

## 2012-12-12 DIAGNOSIS — C50419 Malignant neoplasm of upper-outer quadrant of unspecified female breast: Secondary | ICD-10-CM

## 2012-12-12 LAB — CBC WITH DIFFERENTIAL/PLATELET
BASO%: 0.6 % (ref 0.0–2.0)
Basophils Absolute: 0 10*3/uL (ref 0.0–0.1)
EOS%: 2.1 % (ref 0.0–7.0)
Eosinophils Absolute: 0.2 10*3/uL (ref 0.0–0.5)
HCT: 37.9 % (ref 34.8–46.6)
HGB: 12.9 g/dL (ref 11.6–15.9)
LYMPH%: 30.2 % (ref 14.0–49.7)
MCH: 32.8 pg (ref 25.1–34.0)
MCHC: 33.9 g/dL (ref 31.5–36.0)
MCV: 96.6 fL (ref 79.5–101.0)
MONO#: 0.5 10*3/uL (ref 0.1–0.9)
MONO%: 6.3 % (ref 0.0–14.0)
NEUT#: 4.5 10*3/uL (ref 1.5–6.5)
NEUT%: 60.8 % (ref 38.4–76.8)
Platelets: 368 10*3/uL (ref 145–400)
RBC: 3.92 10*6/uL (ref 3.70–5.45)
RDW: 13.3 % (ref 11.2–14.5)
WBC: 7.5 10*3/uL (ref 3.9–10.3)
lymph#: 2.3 10*3/uL (ref 0.9–3.3)

## 2012-12-12 LAB — COMPREHENSIVE METABOLIC PANEL (CC13)
ALT: 66 U/L — ABNORMAL HIGH (ref 0–55)
AST: 53 U/L — ABNORMAL HIGH (ref 5–34)
Albumin: 3.8 g/dL (ref 3.5–5.0)
Alkaline Phosphatase: 65 U/L (ref 40–150)
BUN: 14 mg/dL (ref 7.0–26.0)
CO2: 26 mEq/L (ref 22–29)
Calcium: 9.6 mg/dL (ref 8.4–10.4)
Chloride: 104 mEq/L (ref 98–107)
Creatinine: 0.8 mg/dL (ref 0.6–1.1)
Glucose: 130 mg/dl — ABNORMAL HIGH (ref 70–99)
Potassium: 4.1 mEq/L (ref 3.5–5.1)
Sodium: 138 mEq/L (ref 136–145)
Total Bilirubin: 0.74 mg/dL (ref 0.20–1.20)
Total Protein: 8.2 g/dL (ref 6.4–8.3)

## 2012-12-12 LAB — CANCER ANTIGEN 27.29: CA 27.29: 12 U/mL (ref 0–39)

## 2012-12-12 LAB — LACTATE DEHYDROGENASE (CC13): LDH: 214 U/L (ref 125–245)

## 2012-12-14 ENCOUNTER — Telehealth: Payer: Self-pay | Admitting: Oncology

## 2012-12-14 ENCOUNTER — Ambulatory Visit (HOSPITAL_BASED_OUTPATIENT_CLINIC_OR_DEPARTMENT_OTHER): Payer: Medicare Other | Admitting: Oncology

## 2012-12-14 VITALS — BP 156/78 | HR 99 | Temp 98.2°F | Resp 20 | Ht 68.5 in | Wt 246.9 lb

## 2012-12-14 DIAGNOSIS — Z853 Personal history of malignant neoplasm of breast: Secondary | ICD-10-CM

## 2012-12-14 DIAGNOSIS — C50919 Malignant neoplasm of unspecified site of unspecified female breast: Secondary | ICD-10-CM

## 2012-12-14 DIAGNOSIS — R7401 Elevation of levels of liver transaminase levels: Secondary | ICD-10-CM

## 2012-12-14 NOTE — Patient Instructions (Addendum)
1.  History of breast cancer. 2.  Status:  In remission. 3.  Follow up:  Yearly mammogram; and yearly visit.

## 2012-12-14 NOTE — Telephone Encounter (Signed)
gv and printed appt schedule for pt for Feb and Jan 2015.Marland Kitchen..scheduled mamm with alyia for Feb 17 @ 8:20am

## 2012-12-14 NOTE — Progress Notes (Signed)
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   PAST THERAPY:  1.  Status post neoadjuvant chemotherapy with TAC x4 cycles.  2.  Status post left lumpectomy with axillary lymph node dissection. Residual tumor 1 cm ER PR negative and HER- 2/neu negative. 3.  Status post adjuvant radiation therapy to the left breast.  4.  Adjuvant Cytoxan with 5-FU x4 cycles (methotrexate added at cycle 4).  CURRENT THERAPY:  Watchful observation.   INTERVAL HISTORY: Lindsey Marquez 62 y.o. female returns for regular follow up by herself. She reports routine breast exam without any recent finding. She denies any major problem.  She has mild fatigue; however, she takes care of her grand kids at home.  Patient denies fever, anorexia, weight loss, headache, visual changes, confusion, drenching night sweats, palpable lymph node swelling, mucositis, odynophagia, dysphagia, nausea vomiting, jaundice, chest pain, palpitation, shortness of breath, dyspnea on exertion, productive cough, gum bleeding, epistaxis, hematemesis, hemoptysis, abdominal pain, abdominal swelling, early satiety, melena, hematochezia, hematuria, skin rash, spontaneous bleeding, joint swelling, joint pain, heat or cold intolerance, bowel bladder incontinence, back pain, focal motor weakness, paresthesia, depression, suicidal or homicidal ideation, feeling hopelessness.   Past Medical History  Diagnosis Date  . Breast cancer August 2002    Invasive ductal carcinoma Left breast.    Past Surgical History  Procedure Date  . Foot surgery 2000  . Tubal ligation 22 yrs since  2004.    Bilateral.    Current Outpatient Prescriptions  Medication Sig Dispense Refill  . aspirin 81 MG tablet Take 160 mg by mouth daily.        Marland Kitchen glyBURIDE-metformin (GLUCOVANCE) 2.5-500 MG per tablet Take 1  tablet by mouth 2 (two) times daily with a meal.        . ibuprofen (ADVIL,MOTRIN) 200 MG tablet Take 200 mg by mouth every 8 (eight) hours as needed.        Marland Kitchen lisinopril-hydrochlorothiazide (PRINZIDE,ZESTORETIC) 20-25 MG per tablet Take 1 tablet by mouth daily.          ALLERGIES:  is allergic to codeine.  REVIEW OF SYSTEMS:  The rest of the 14-point review of system was negative.   Filed Vitals:   12/14/12 1002  BP: 156/78  Pulse: 99  Temp: 98.2 F (36.8 C)  Resp: 20   Wt Readings from Last 3 Encounters:  12/14/12 246 lb 14.4 oz (111.993 kg)  12/08/11 240 lb 4.8 oz (108.999 kg)  05/01/11 240 lb 9.6 oz (109.135 kg)   ECOG Performance status: 0  PHYSICAL EXAMINATION:   General:  well-nourished woman, in no acute distress.  Eyes:  no scleral icterus.  ENT:  There were no oropharyngeal lesions.  Neck was without thyromegaly.  Lymphatics:  Negative cervical, supraclavicular or axillary adenopathy.  Respiratory: lungs were clear bilaterally without wheezing or crackles.  Cardiovascular:  Regular rate and rhythm, S1/S2, without murmur, rub or gallop.  There was no pedal edema.  GI:  abdomen was soft, flat, nontender, nondistended, without organomegaly.  Muscoloskeletal:  no spinal tenderness of palpation of vertebral spine.  Skin exam was without echymosis, petichae.  Neuro exam was nonfocal.  Patient was able to get on and off exam table without assistance.  Gait was normal.  Patient was alerted and oriented.  Attention was good.   Language was appropriate.  Mood was normal without  depression.  Speech was not pressured.  Thought content was not tangential.  Bilateral breast exam in the presence of managing staff Ms. Cameo Hale Bogus showed lumpectomy scar that was well-healed intact in the left breast. There was no bilateral breast palpable nodule, erythema, skin thickening, nipple discharge, nipple inversion.   LABORATORY/RADIOLOGY DATA:  Lab Results  Component Value Date   WBC 7.5  12/12/2012   HGB 12.9 12/12/2012   HCT 37.9 12/12/2012   PLT 368 12/12/2012   GLUCOSE 130* 12/12/2012   ALKPHOS 65 12/12/2012   ALT 66* 12/12/2012   AST 53* 12/12/2012   NA 138 12/12/2012   K 4.1 12/12/2012   CL 104 12/12/2012   CREATININE 0.8 12/12/2012   BUN 14.0 12/12/2012   CO2 26 12/12/2012      ASSESSMENT AND PLAN:   1.  history of inflammatory breast cancer: I discussed with Lindsey Marquez that there is no evidence of recurrent or metastatic disease on today's clinical history, physical exam, lab test. There was no residual side effects from chemotherapy treatment in the past. She is due for a routine screening bilateral mammogram due in February 2014 which I ordered today.   2.  slight transaminitis: Most likely due to slight weight gain over holiday. She does not have any symptoms including jaundice, abdominal pain.  I recommended losing weight with exercise routinely. In the future, if her LFT continues to increase, I may consider abdominal ultrasound.  3.  Follow up:  In about 1 year.      The length of time of the face-to-face encounter was 15 minutes. More than 50% of time was spent counseling and coordination of care.

## 2013-01-16 ENCOUNTER — Ambulatory Visit
Admission: RE | Admit: 2013-01-16 | Discharge: 2013-01-16 | Disposition: A | Discharge status: Home / Self Care | Payer: Medicare PPO | Source: Ambulatory Visit | Attending: Oncology | Admitting: Oncology

## 2013-01-16 DIAGNOSIS — C50919 Malignant neoplasm of unspecified site of unspecified female breast: Secondary | ICD-10-CM

## 2013-12-13 ENCOUNTER — Other Ambulatory Visit: Payer: Self-pay | Admitting: Hematology and Oncology

## 2013-12-13 DIAGNOSIS — C50919 Malignant neoplasm of unspecified site of unspecified female breast: Secondary | ICD-10-CM

## 2013-12-14 ENCOUNTER — Other Ambulatory Visit (HOSPITAL_BASED_OUTPATIENT_CLINIC_OR_DEPARTMENT_OTHER): Payer: Medicare PPO

## 2013-12-14 ENCOUNTER — Ambulatory Visit (HOSPITAL_BASED_OUTPATIENT_CLINIC_OR_DEPARTMENT_OTHER): Payer: BC Managed Care – PPO | Admitting: Hematology and Oncology

## 2013-12-14 ENCOUNTER — Encounter: Payer: Self-pay | Admitting: Hematology and Oncology

## 2013-12-14 ENCOUNTER — Telehealth: Payer: Self-pay | Admitting: Hematology and Oncology

## 2013-12-14 ENCOUNTER — Encounter (INDEPENDENT_AMBULATORY_CARE_PROVIDER_SITE_OTHER): Payer: Self-pay

## 2013-12-14 VITALS — BP 157/69 | HR 89 | Temp 99.0°F | Resp 18 | Ht 68.0 in | Wt 242.0 lb

## 2013-12-14 DIAGNOSIS — Z8041 Family history of malignant neoplasm of ovary: Secondary | ICD-10-CM

## 2013-12-14 DIAGNOSIS — E119 Type 2 diabetes mellitus without complications: Secondary | ICD-10-CM

## 2013-12-14 DIAGNOSIS — C50919 Malignant neoplasm of unspecified site of unspecified female breast: Secondary | ICD-10-CM

## 2013-12-14 DIAGNOSIS — Z853 Personal history of malignant neoplasm of breast: Secondary | ICD-10-CM

## 2013-12-14 DIAGNOSIS — K7689 Other specified diseases of liver: Secondary | ICD-10-CM

## 2013-12-14 LAB — COMPREHENSIVE METABOLIC PANEL (CC13)
ALT: 79 U/L — ABNORMAL HIGH (ref 0–55)
AST: 62 U/L — ABNORMAL HIGH (ref 5–34)
Albumin: 3.8 g/dL (ref 3.5–5.0)
Alkaline Phosphatase: 59 U/L (ref 40–150)
Anion Gap: 10 mEq/L (ref 3–11)
BUN: 14.1 mg/dL (ref 7.0–26.0)
CO2: 26 mEq/L (ref 22–29)
Calcium: 9.4 mg/dL (ref 8.4–10.4)
Chloride: 103 mEq/L (ref 98–109)
Creatinine: 0.8 mg/dL (ref 0.6–1.1)
Glucose: 153 mg/dl — ABNORMAL HIGH (ref 70–140)
Potassium: 3.8 mEq/L (ref 3.5–5.1)
Sodium: 139 mEq/L (ref 136–145)
Total Bilirubin: 0.7 mg/dL (ref 0.20–1.20)
Total Protein: 7.7 g/dL (ref 6.4–8.3)

## 2013-12-14 LAB — CBC WITH DIFFERENTIAL/PLATELET
BASO%: 0.7 % (ref 0.0–2.0)
Basophils Absolute: 0.1 10*3/uL (ref 0.0–0.1)
EOS%: 3.4 % (ref 0.0–7.0)
Eosinophils Absolute: 0.3 10*3/uL (ref 0.0–0.5)
HCT: 37.7 % (ref 34.8–46.6)
HGB: 12.7 g/dL (ref 11.6–15.9)
LYMPH%: 31.5 % (ref 14.0–49.7)
MCH: 33 pg (ref 25.1–34.0)
MCHC: 33.6 g/dL (ref 31.5–36.0)
MCV: 98.3 fL (ref 79.5–101.0)
MONO#: 0.6 10*3/uL (ref 0.1–0.9)
MONO%: 7.3 % (ref 0.0–14.0)
NEUT#: 4.4 10*3/uL (ref 1.5–6.5)
NEUT%: 57.1 % (ref 38.4–76.8)
Platelets: 362 10*3/uL (ref 145–400)
RBC: 3.84 10*6/uL (ref 3.70–5.45)
RDW: 13.1 % (ref 11.2–14.5)
WBC: 7.8 10*3/uL (ref 3.9–10.3)
lymph#: 2.4 10*3/uL (ref 0.9–3.3)

## 2013-12-14 NOTE — Progress Notes (Signed)
[place] OFFICE PROGRESS NOTE  Patient Care Team: Royetta Crochet. 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 The patient noted abnormalities on the left breast at the time of diagnosis. She had biopsy done at the area of abnormalities and was found to have inflammatory breast cancer, ER/PR HER-2/neu negative. The patient received 4 cycles of neoadjuvant chemotherapy with TAC followed by lumpectomy and axillary lymph node dissection. After surgery she have postoperative adjuvant radiation therapy followed by further chemotherapy with Cytoxan, methotrexate and 5-FU  SUMMARY OF ONCOLOGIC HISTORY: Oncology History   Inflammatory breast cancer, triple negative   Primary site: Breast (Left)   Staging method: AJCC 7th Edition   Clinical: Stage IIIB (T4d, N1, cM0) signed by [name], MD on 12/14/2013  9:43 AM   Pathologic: Stage IIIB (T4d, N1a, cM0) signed by [name], MD on 12/14/2013  9:43 AM   Summary: Stage IIIB (T4d, N1a, cM0)       Inflammatory breast cancer   09/12/2002 Imaging CT scan show no evidence of disease apart from the left axilla   01/10/2003 Surgery The patient had left lumpectomy and axillary lymph node dissection which show residual breast cancer and a sentinel lymph node was positive    INTERVAL HISTORY: [name] 63 y.o. female returns for further followup. She's doing well. She denies any recent abnormal breast examination, palpable mass, abnormal breast appearance or nipple changes  I have reviewed the past medical history, past surgical history, social history and family history with the patient and they are unchanged from previous note.  ALLERGIES:  is allergic to codeine.  MEDICATIONS:  Current Outpatient Prescriptions  Medication Sig Dispense Refill  .  aspirin 81 MG tablet Take 160 mg by mouth daily.        Marland Kitchen glyBURIDE-metformin (GLUCOVANCE) 2.5-500 MG per tablet Take 1 tablet by mouth 2 (two) times daily with a meal.        . ibuprofen (ADVIL,MOTRIN) 200 MG tablet Take 200 mg by mouth every 8 (eight) hours as needed.        Marland Kitchen lisinopril-hydrochlorothiazide (PRINZIDE,ZESTORETIC) 20-25 MG per tablet Take 1 tablet by mouth daily.         No current facility-administered medications for this visit.    REVIEW OF SYSTEMS:   Constitutional: Denies fevers, chills or abnormal weight loss Eyes: Denies blurriness of vision Ears, nose, mouth, throat, and face: Denies mucositis or sore throat Respiratory: Denies cough, dyspnea or wheezes Cardiovascular: Denies palpitation, chest discomfort or lower extremity swelling Gastrointestinal:  Denies nausea, heartburn or change in bowel habits Skin: Denies abnormal skin rashes Lymphatics: Denies new lymphadenopathy or easy bruising Neurological:Denies numbness, tingling or new weaknesses Behavioral/Psych: Mood is stable, no new changes  All other systems were reviewed with the patient and are negative.  PHYSICAL EXAMINATION: ECOG PERFORMANCE STATUS: 0 - Asymptomatic  Filed Vitals:   12/14/13 0900  BP: 157/69  Pulse: 89  Temp: 99 F (37.2 C)  Resp: 18   Filed Weights   12/14/13 0900  Weight: 242 lb (109.77 kg)    GENERAL:alert, no distress and comfortable SKIN: skin color, texture, turgor are normal, no rashes or significant lesions EYES: normal, Conjunctiva are pink and non-injected, sclera clear OROPHARYNX:no exudate, no erythema and lips, buccal mucosa, and tongue normal  NECK: supple, thyroid normal size, non-tender, without  nodularity LYMPH:  no palpable lymphadenopathy in the cervical, axillary or inguinal LUNGS: clear to auscultation and percussion with normal breathing effort HEART: regular rate & rhythm and no murmurs and no lower extremity edema ABDOMEN:abdomen soft, non-tender  and normal bowel sounds Musculoskeletal:no cyanosis of digits and no clubbing  NEURO: alert & oriented x 3 with fluent speech, no focal motor/sensory deficits Bilateral breast examination was performed. That is well-healed surgical scar on the left breast with no palpable abnormalities LABORATORY DATA:  I have reviewed the data as listed    Component Value Date/Time   NA 139 12/14/2013 0848   NA 139 12/03/2011 0929   K 3.8 12/14/2013 0848   K 4.3 12/03/2011 0929   CL 104 12/12/2012 1031   CL 102 12/03/2011 0929   CO2 26 12/14/2013 0848   CO2 27 12/03/2011 0929   GLUCOSE 153* 12/14/2013 0848   GLUCOSE 130* 12/12/2012 1031   GLUCOSE 136* 12/03/2011 0929   BUN 14.1 12/14/2013 0848   BUN 14 12/03/2011 0929   CREATININE 0.8 12/14/2013 0848   CREATININE 0.76 12/03/2011 0929   CALCIUM 9.4 12/14/2013 0848   CALCIUM 9.5 12/03/2011 0929   PROT 7.7 12/14/2013 0848   PROT 7.4 12/03/2011 0929   ALBUMIN 3.8 12/14/2013 0848   ALBUMIN 4.4 12/03/2011 0929   AST 62* 12/14/2013 0848   AST 40* 12/03/2011 0929   ALT 79* 12/14/2013 0848   ALT 50* 12/03/2011 0929   ALKPHOS 59 12/14/2013 0848   ALKPHOS 58 12/03/2011 0929   BILITOT 0.70 12/14/2013 0848   BILITOT 0.4 12/03/2011 0929    No results found for this basename: SPEP, UPEP,  kappa and lambda light chains    Lab Results  Component Value Date   WBC 7.8 12/14/2013   NEUTROABS 4.4 12/14/2013   HGB 12.7 12/14/2013   HCT 37.7 12/14/2013   MCV 98.3 12/14/2013   PLT 362 12/14/2013      Chemistry      Component Value Date/Time   NA 139 12/14/2013 0848   NA 139 12/03/2011 0929   K 3.8 12/14/2013 0848   K 4.3 12/03/2011 0929   CL 104 12/12/2012 1031   CL 102 12/03/2011 0929   CO2 26 12/14/2013 0848   CO2 27 12/03/2011 0929   BUN 14.1 12/14/2013 0848   BUN 14 12/03/2011 0929   CREATININE 0.8 12/14/2013 0848   CREATININE 0.76 12/03/2011 0929      Component Value Date/Time   CALCIUM 9.4 12/14/2013 0848   CALCIUM 9.5 12/03/2011 0929   ALKPHOS 59 12/14/2013 0848   ALKPHOS 58 12/03/2011 0929   AST  62* 12/14/2013 0848   AST 40* 12/03/2011 0929   ALT 79* 12/14/2013 0848   ALT 50* 12/03/2011 0929   BILITOT 0.70 12/14/2013 0848   BILITOT 0.4 12/03/2011 0929     ASSESSMENT & PLAN:  #1 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 She also has strong family history of ovarian cancer. The patient fulfill the criteria for genetic screening. I discussed with her the importance of genetic counseling and she agreed to proceed. Today, there is no evidence of disease recurrence. Her most recent mammogram was normal. I will see her on a yearly basis with history, physical examination and yearly mammogram #3 elevated liver enzymes I revealed multiple imaging studies which showed evidence of fatty liver disease. The patient is a  diabetic. I recommend weight loss and dietary modification.  Orders Placed This Encounter  Procedures  . Ambulatory referral to Genetics    Referral Priority:  Routine    Referral Type:  Consultation    Referral Reason:  Specialty Services Required    Requested Specialty:  Genetics    Number of Visits Requested:  1   All questions were answered. The patient knows to call the clinic with any problems, questions or concerns. No barriers to learning was detected. I spent 25 minutes counseling the patient face to face. The total time spent in the appointment was 40 minutes and more than 50% was on counseling and review of test results     [facility], [place], MD 12/14/2013 9:48 AM

## 2013-12-14 NOTE — Telephone Encounter (Signed)
Gave pt appt for Md and Genetic for January 2016 and MArch 2015

## 2014-01-03 ENCOUNTER — Other Ambulatory Visit: Payer: Self-pay

## 2014-01-03 DIAGNOSIS — Z1231 Encounter for screening mammogram for malignant neoplasm of breast: Secondary | ICD-10-CM

## 2014-01-14 ENCOUNTER — Ambulatory Visit (INDEPENDENT_AMBULATORY_CARE_PROVIDER_SITE_OTHER): Payer: Medicare PPO | Admitting: Emergency Medicine

## 2014-01-14 VITALS — BP 138/62 | HR 86 | Temp 98.6°F | Resp 18 | Ht 68.0 in | Wt 240.8 lb

## 2014-01-14 DIAGNOSIS — H1030 Unspecified acute conjunctivitis, unspecified eye: Secondary | ICD-10-CM

## 2014-01-14 MED ORDER — TOBRAMYCIN 0.3 % OP SOLN: 1.0000 [drp] | OPHTHALMIC | Status: DC

## 2014-01-14 NOTE — Progress Notes (Signed)
[facility] [address], [place] [address] [phone]  Date:  01/14/2014   Name:  Lindsey Marquez   DOB:  [birth year]   MRN:  [id number]  PCP:  Salena Saner., MD    Chief Complaint: Eye Problem   History of Present Illness:  Lindsey Marquez is a 63 y.o. very pleasant female patient who presents with the following:  Seen by FMD and put on bacitracin in both eyes for "styes".  Did not improve.  Went back and was treated with patanol. Now has increased injection, reddness and watery discharge.  Some gluing in both eyes in AM.  Says both eyes irritated but not painful.  No fever or chills, cough coryza, nasal congestion or drainage. No improvement with over the counter medications or other home remedies. Denies other complaint or health concern today.   Patient Active Problem List   Diagnosis Date Noted  . Inflammatory breast cancer 12/08/2011    Past Medical History  Diagnosis Date  . Breast cancer August 2002    Invasive ductal carcinoma Left breast.    Past Surgical History  Procedure Laterality Date  . Foot surgery  2000  . Tubal ligation  22 yrs since  2004.    Bilateral.    History  Substance Use Topics  . Smoking status: Never Smoker   . Smokeless tobacco: Never Used  . Alcohol Use: No    Family History  Problem Relation Age of Onset  . Prostate cancer Father   . Ovarian cancer Maternal Aunt     2 maternal aunts had ovarian cancer.  . Bone cancer Maternal Uncle   . Prostate cancer Paternal Uncle   . Ovarian cancer Mother 63    ovarian ca    Allergies  Allergen Reactions  . Codeine     Medication list has been reviewed and updated.  Current Outpatient Prescriptions on File Prior to Visit  Medication Sig Dispense Refill  . aspirin 81 MG tablet Take 160 mg by mouth daily.        Marland Kitchen glyBURIDE-metformin (GLUCOVANCE) 2.5-500 MG per tablet Take 1 tablet by mouth 2 (two) times daily with a meal.        . ibuprofen  (ADVIL,MOTRIN) 200 MG tablet Take 200 mg by mouth every 8 (eight) hours as needed.        Marland Kitchen lisinopril-hydrochlorothiazide (PRINZIDE,ZESTORETIC) 20-25 MG per tablet Take 1 tablet by mouth daily.         No current facility-administered medications on file prior to visit.    Review of Systems:  As per HPI, otherwise negative.    Physical Examination: Filed Vitals:   01/14/14 1138  BP: 138/62  Pulse: 86  Temp: 98.6 F (37 C)  Resp: 18   Filed Vitals:   01/14/14 1138  Height: 5\' 8"  (1.727 m)  Weight: 240 lb 12.8 oz (109.226 kg)   Body mass index is 36.62 kg/(m^2). Ideal Body Weight: Weight in (lb) to have BMI = 25: 164.1   GEN: WDWN, NAD, Non-toxic, Alert & Oriented x 3 HEENT: Atraumatic, Normocephalic.   Bilaterally conjunctival injection and scleral edema.  No chemosis.  No FB.   Ears and Nose: No external deformity. EXTR: No clubbing/cyanosis/edema NEURO: Normal gait.  PSYCH: Normally interactive. Conversant. Not depressed or anxious appearing.  Calm demeanor.    Assessment and Plan: Bilateral conjunctivitis tobrex Eye doctor Monday   Signed,  Ellison Carwin, MD

## 2014-01-14 NOTE — Patient Instructions (Signed)

## 2014-01-19 ENCOUNTER — Other Ambulatory Visit: Payer: Self-pay | Admitting: Emergency Medicine

## 2014-01-23 ENCOUNTER — Ambulatory Visit: Payer: Medicare PPO

## 2014-01-23 ENCOUNTER — Other Ambulatory Visit: Payer: Self-pay | Admitting: Emergency Medicine

## 2014-02-05 ENCOUNTER — Ambulatory Visit
Admission: RE | Admit: 2014-02-05 | Discharge: 2014-02-05 | Disposition: A | Discharge status: Home / Self Care | Payer: Medicare PPO | Source: Ambulatory Visit

## 2014-02-05 DIAGNOSIS — Z1231 Encounter for screening mammogram for malignant neoplasm of breast: Secondary | ICD-10-CM

## 2014-02-12 ENCOUNTER — Encounter: Payer: Medicare Other | Admitting: Genetic Counselor

## 2014-02-13 ENCOUNTER — Telehealth: Payer: Self-pay | Admitting: Hematology and Oncology

## 2014-02-13 NOTE — Telephone Encounter (Signed)
Pt called and r/s appt to may 2015

## 2014-02-15 ENCOUNTER — Encounter: Payer: Medicare Other | Admitting: Genetic Counselor

## 2014-02-15 ENCOUNTER — Other Ambulatory Visit: Payer: Medicare Other

## 2014-03-27 ENCOUNTER — Telehealth: Payer: Self-pay | Admitting: *Deleted

## 2014-03-27 NOTE — Telephone Encounter (Signed)
Called pt to inform her about Santiago Glad and reschedule her genetic appt.  She is driving and cannot due at the moment.  She requested for me to call her back tomorrow after 10am to reschedule.

## 2014-03-30 ENCOUNTER — Telehealth: Payer: Self-pay | Admitting: *Deleted

## 2014-03-30 NOTE — Telephone Encounter (Signed)
Pt returned my call and I informed her that Santiago Glad is no longer with [place] and I needed to reschedule her genetic appt.  She was fine with that and I confirmed 05/09/14 genetic appt w/ pt.

## 2014-03-30 NOTE — Telephone Encounter (Signed)
Left message for pt to return my call so I can reschedule her genetic appt. 

## 2014-04-19 ENCOUNTER — Encounter: Payer: Medicare PPO | Admitting: Genetic Counselor

## 2014-04-19 ENCOUNTER — Other Ambulatory Visit: Payer: Medicare PPO

## 2014-05-09 ENCOUNTER — Other Ambulatory Visit: Payer: BC Managed Care – PPO

## 2014-05-09 ENCOUNTER — Ambulatory Visit (HOSPITAL_BASED_OUTPATIENT_CLINIC_OR_DEPARTMENT_OTHER): Payer: Medicare PPO | Admitting: Genetic Counselor

## 2014-05-09 ENCOUNTER — Encounter: Payer: Self-pay | Admitting: Genetic Counselor

## 2014-05-09 DIAGNOSIS — Z809 Family history of malignant neoplasm, unspecified: Secondary | ICD-10-CM

## 2014-05-09 DIAGNOSIS — Z8041 Family history of malignant neoplasm of ovary: Secondary | ICD-10-CM

## 2014-05-09 DIAGNOSIS — Z800 Family history of malignant neoplasm of digestive organs: Secondary | ICD-10-CM

## 2014-05-09 DIAGNOSIS — Z808 Family history of malignant neoplasm of other organs or systems: Secondary | ICD-10-CM

## 2014-05-09 DIAGNOSIS — C50919 Malignant neoplasm of unspecified site of unspecified female breast: Secondary | ICD-10-CM

## 2014-05-09 DIAGNOSIS — Z853 Personal history of malignant neoplasm of breast: Secondary | ICD-10-CM

## 2014-05-09 NOTE — Progress Notes (Signed)
Patient Name: JENE HUQ Patient Age: 63 y.o. Encounter Date: 05/09/2014  Referring Physician: [name], MD  Primary Care Provider: Salena Saner., MD   Ms. ALEXCIS BICKING, a 63 y.o. female, is being seen at the [facility] due to a personal history of breast cancer and family history of various cancers.  She presents to clinic today with one of her sons to discuss the possibility of a hereditary predisposition to cancer and discuss whether genetic testing is warranted.  HISTORY OF PRESENT ILLNESS: Ms. Vilchis was diagnosed with left breast cancer at the age of 74. She had neoadjuvant chemotherapy, lumpectomy, radiation and additional chemotherapy. The tumor was ER negative, PR negative and HER2 negative.  She states that she currently undergoes a yearly mammogram, clinical breast exam and gynecologic exam. She states that she had a colonoscopy in ~2008 and ~2013, both of which were negative for polyps.   Oncology History   Inflammatory breast cancer, triple negative   Primary site: Breast (Left)   Staging method: AJCC 7th Edition   Clinical: Stage IIIB (T4d, N1, cM0) signed by [name], MD on 12/14/2013  9:43 AM   Pathologic: Stage IIIB (T4d, N1a, cM0) signed by [name], MD on 12/14/2013  9:43 AM   Summary: Stage IIIB (T4d, N1a, cM0)       Inflammatory breast cancer   09/12/2002 Imaging CT scan show no evidence of disease apart from the left axilla   01/10/2003 Surgery The patient had left lumpectomy and axillary lymph node dissection which show residual breast cancer and a sentinel lymph node was positive    Past Medical History  Diagnosis Date  . Breast cancer August 2002    Invasive ductal carcinoma Left breast.  . Family history of malignant neoplasm of ovary   . Family history of pancreatic cancer     History   Social History  . Marital Status: Divorced    Spouse Name: N/A    Number of Children: N/A  . Years of Education: N/A   Social  History Main Topics  . Smoking status: Never Smoker   . Smokeless tobacco: Never Used  . Alcohol Use: No  . Drug Use: No  . Sexual Activity: Not on file   Other Topics Concern  . Not on file   Social History Narrative  . No narrative on file     FAMILY HISTORY:   During the visit, a 4-generation pedigree was obtained. Significant diagnoses include the following:  Family History  Problem Relation Age of Onset  . Cancer Father     unk. primary; deceased 58; father was an only child  . Ovarian cancer Maternal Aunt 61    deceased  . Cancer Maternal Uncle     "bone ca"; unk. primary; deceased 12s  . Pancreatic cancer Mother 52    deceased  . Cancer Maternal Aunt     2 other mat aunts; unk. primary in 50s; deceased  . Prostate cancer Maternal Uncle     deceased 61    Additionally, Ms. Dicke has two living sons (age 60 and 56). One son died at age 73 due to heart issues causes by obesity. The family history provided today differs significantly from the history previously provided to her physician. She now states that her mother had pancreatic cancer; not ovarian. Also, only one maternal aunt had ovarian cancer, rather than two.  Ms. Urick ancestry is African American. There is no known Jewish ancestry and no consanguinity.  ASSESSMENT AND PLAN: Ms. Lemberger is a 63 y.o. female with a personal history of triple negative breast cancer at age 42 and family history of various cancers. This history is somewhat suggestive of a hereditary predisposition to cancer, specifically BRCA1 or BRCA2 or a mutation in a less common gene such as PALB2. Her family history, however, is difficult to confirm and she knows very little about specific diagnoses of cancer. We reviewed the characteristics, features and inheritance patterns of hereditary cancer syndromes. We also discussed genetic testing, including the process of testing, insurance coverage and implications of results. A negative result will be  generally reassuring for Ms. Dileo, but she is aware that her sisters would still have an elevated risk of breast cancer due to the family history.  Ms. Slider wished to pursue genetic testing and a blood sample will be sent to [facility] for analysis of the 24 genes on the OvaNext panel (ATM, BARD1, BRCA1, BRCA2, BRIP1, CDH1, CHEK2, EPCAM, MLH1, MRE11A, MSH2, MSH6, MUTYH, NBN, NF1, PALB2, PMS2, PTEN, RAD50, RAD51C, RAD51D, SMARCA4, STK11, and TP53) We discussed the implications of a positive, negative and/ or Variant of Uncertain Significance (VUS) result. Results should be available in approximately 5-6 weeks, at which point we will contact her and address implications for her as well as address genetic testing for at-risk family members, if needed.    We encouraged Ms. Cancel to remain in contact with Cancer Genetics annually so that we can update the family history and inform her of any changes in cancer genetics and testing that may be of benefit for this family. Ms.  Manzo questions were answered to her satisfaction today.   Thank you for the referral and allowing [place] to share in the care of your patient.   The patient was seen for a total of 35 minutes, greater than 50% of which was spent face-to-face counseling. This patient was discussed with the overseeing provider who agrees with the above.

## 2014-06-11 ENCOUNTER — Encounter: Payer: Self-pay | Admitting: Genetic Counselor

## 2014-06-11 NOTE — Progress Notes (Signed)
Referring Physician: Ni Gorsuch, MD    Lindsey Marquez was called today to discuss genetic test results. Please see the Genetics note from her visit on 05/09/14 for a detailed discussion of her personal and family history.  GENETIC TESTING: At the time of Lindsey Marquez visit, we recommended she pursue genetic testing of multiple genes on the OvaNext gene panel. This test, which included sequencing and deletion/duplication analysis of 24 genes, was performed at [facility]. Testing was normal and did not reveal any clearly harmful mutation in these genes. The genes tested were (ATM, BARD1, BRCA1, BRCA2, BRIP1, CDH1, CHEK2, EPCAM, MLH1, MRE11A, MSH2, MSH6, MUTYH, NBN, NF1, PALB2, PMS2, PTEN, RAD50, RAD51C, RAD51D, SMARCA4, STK11, and TP53).  We discussed with Lindsey Marquez that since the current test is not perfect, it is possible there may be a gene mutation that current testing cannot detect, but that chance is small. We also discussed that it is possible that a different genetic factor, which was not part of this testing or has not yet been discovered, is responsible for the cancer diagnoses in the family.   Genetic testing did detect a Variant of Unknown Significance in the PMS2 gene called p.Y191C (c.572A>G). At this time, it is unknown if this variant is associated with increased cancer risk or if this is a normal finding, but most variants such as this get reclassified to being inconsequential. It should not be used to make medical management decisions. With time, we suspect the lab will determine the significance of this variant, if any. If we do learn more about it, we will try to contact Lindsey Marquez to discuss it further. However, it is important to stay in touch with us periodically and keep the address and phone number up to date.  CANCER SCREENING: This result suggests that Lindsey Marquez cancer was most likely not due to an inherited predisposition. Most cancers happen by chance and this negative test, along  with details of her family history, suggests that her Cancer may fall into this category. We, therefore, recommended she continue to follow the cancer screening guidelines provided by her physician.   FAMILY MEMBERS: Women in the family, such as Lindsey Marquez sisters, are at some increased risk of developing breast cancer, over the general population risk, simply due to the family history. We recommended they have a yearly mammogram, a yearly clinical breast exam, and perform monthly breast self-exams. A gynecologic exam is recommended yearly. Colon cancer screening is recommended to begin by age 50. Her unaffected relatives should not have genetic testing for the PMS2 VUS as this would have no clinical relevance for them. If any relatives develop cancer, Lindsey Marquez should let us know so that we can determine what testing, if any, is recommended.  Lastly, we discussed with Lindsey Marquez that cancer genetics is a rapidly advancing field and it is possible that new genetic tests will be appropriate for her in the future. We encouraged her to remain in contact with us on an annual basis so we can update her personal and family histories, and let her know of advances in cancer genetics that may benefit the family. Our contact number was provided. Lindsey Marquez's questions were answered to her satisfaction today, and she knows she is welcome to call anytime with additional questions.    Ofri Leitner, MS, CGC Certified Genetic Counseor phone: [phone] [email or web address] 

## 2014-12-14 ENCOUNTER — Ambulatory Visit: Payer: Medicare Other | Admitting: Hematology and Oncology

## 2014-12-14 ENCOUNTER — Other Ambulatory Visit: Payer: Medicare Other

## 2015-01-07 ENCOUNTER — Other Ambulatory Visit: Payer: Self-pay

## 2015-01-07 DIAGNOSIS — Z1231 Encounter for screening mammogram for malignant neoplasm of breast: Secondary | ICD-10-CM

## 2015-02-19 ENCOUNTER — Other Ambulatory Visit: Payer: Self-pay

## 2015-02-19 ENCOUNTER — Ambulatory Visit
Admission: RE | Admit: 2015-02-19 | Discharge: 2015-02-19 | Disposition: A | Discharge status: Home / Self Care | Payer: Medicare PPO | Source: Ambulatory Visit

## 2015-02-19 DIAGNOSIS — Z1231 Encounter for screening mammogram for malignant neoplasm of breast: Secondary | ICD-10-CM

## 2015-10-08 ENCOUNTER — Other Ambulatory Visit: Payer: Self-pay

## 2015-10-08 DIAGNOSIS — Z1231 Encounter for screening mammogram for malignant neoplasm of breast: Secondary | ICD-10-CM

## 2016-01-27 LAB — LIPID PANEL
LDL Cholesterol: 100 mg/dL
Triglycerides: 70 mg/dL (ref 40–160)

## 2016-01-27 LAB — HEMOGLOBIN A1C: Hemoglobin A1C: 9.1

## 2016-01-27 LAB — HM PAP SMEAR: HM Pap smear: NEGATIVE

## 2016-02-12 ENCOUNTER — Ambulatory Visit (INDEPENDENT_AMBULATORY_CARE_PROVIDER_SITE_OTHER): Payer: Medicare PPO | Admitting: Family Medicine

## 2016-02-12 VITALS — BP 146/75 | HR 105 | Temp 98.6°F | Resp 17 | Ht 68.5 in | Wt 238.0 lb

## 2016-02-12 DIAGNOSIS — J209 Acute bronchitis, unspecified: Secondary | ICD-10-CM | POA: Diagnosis not present

## 2016-02-12 MED ORDER — ALBUTEROL SULFATE 108 (90 BASE) MCG/ACT IN AEPB: 2.0000 | INHALATION_SPRAY | Freq: Four times a day (QID) | RESPIRATORY_TRACT | Status: DC | PRN

## 2016-02-12 MED ORDER — ALBUTEROL SULFATE (2.5 MG/3ML) 0.083% IN NEBU
2.5000 mg | INHALATION_SOLUTION | Freq: Once | RESPIRATORY_TRACT | Status: AC
  Administered 2016-02-12: 2.5 mg via RESPIRATORY_TRACT

## 2016-02-12 MED ORDER — BENZONATATE 200 MG PO CAPS: 200.0000 mg | ORAL_CAPSULE | Freq: Two times a day (BID) | ORAL | Status: DC | PRN

## 2016-02-12 MED ORDER — IPRATROPIUM BROMIDE 0.02 % IN SOLN
0.5000 mg | Freq: Once | RESPIRATORY_TRACT | Status: AC
  Administered 2016-02-12: 0.5 mg via RESPIRATORY_TRACT

## 2016-02-12 MED ORDER — AZITHROMYCIN 250 MG PO TABS: ORAL_TABLET | ORAL | Status: DC

## 2016-02-12 NOTE — Progress Notes (Signed)
Patient ID: Lindsey Marquez, female   DOB: Jan 27, 1951, 65 y.o.   MRN: [id number]  By signing my name below, I, Essence Howell, attest that this documentation has been prepared under the direction and in the presence of Robyn Haber, MD Electronically Signed: Ladene Artist, ED Scribe 02/12/2016 at 10:34 AM.  Patient ID: Lindsey Marquez MRN: [id number], DOB: 03-28-1951, 65 y.o. Date of Encounter: 02/12/2016, 10:49 AM  Primary Physician: No primary care provider on file.  Chief Complaint:  Chief Complaint  Patient presents with  . Shortness of Breath  . Cough  . Back Pain   HPI: 65 y.o. year old female with history below presents with gradually worsening cough onset 1 week ago. Pt states that cough keeps her up at night. She reports associated mild fever last night that has resolved, wheezing, mild shortness of breath. She has tried Copy[employer], [employer] Plus without significant relief. Pt reports h/o childhood asthma but no h/o asthma use.  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 She has a total of 6 children from ages 79-15 y.o.  Past Medical History  Diagnosis Date  . Breast cancer [facility]) August 2002    Invasive ductal carcinoma Left breast.  . Family history of malignant neoplasm of ovary   . Family history of pancreatic cancer     Home Meds: Prior to Admission medications   Medication Sig Start Date End Date Taking? Authorizing Provider  aspirin 81 MG tablet Take 160 mg by mouth daily.     Yes Historical Provider, MD  glyBURIDE-metformin (GLUCOVANCE) 2.5-500 MG per tablet Take 1 tablet by mouth 2 (two) times daily with a meal.     Yes Historical Provider, MD  ibuprofen (ADVIL,MOTRIN) 200 MG tablet Take 200 mg by mouth every 8 (eight) hours as needed.     Yes Historical Provider, MD  lisinopril-hydrochlorothiazide (PRINZIDE,ZESTORETIC) 20-25 MG per tablet Take 1 tablet by mouth daily.     Yes Historical Provider, MD  tobramycin (TOBREX) 0.3 % ophthalmic solution Place 1 drop into both eyes  every 4 (four) hours. Patient not taking: Reported on 02/12/2016 01/14/14   Roselee Culver, MD   Allergies:  Allergies  Allergen Reactions  . Codeine    Social History   Social History  . Marital Status: Divorced    Spouse Name: N/A  . Number of Children: N/A  . Years of Education: N/A   Occupational History  . Not on file.   Social History Main Topics  . Smoking status: Never Smoker   . Smokeless tobacco: Never Used  . Alcohol Use: No  . Drug Use: No  . Sexual Activity: Not on file   Other Topics Concern  . Not on file   Social History Narrative    Review of Systems: Constitutional: negative for chills, night sweats, weight changes, or fatigue, +fever (mild) HEENT: negative for vision changes, hearing loss, congestion, rhinorrhea, ST, epistaxis, or sinus pressure Cardiovascular: negative for chest pain or palpitations Respiratory: negative for hemoptysis, +wheezing, +cough, +shortness of breath Abdominal: negative for abdominal pain, nausea, vomiting, diarrhea, or constipation Dermatological: negative for rash Neurologic: negative for headache, dizziness, or syncope All other systems reviewed and are otherwise negative with the exception to those above and in the HPI.  Physical Exam: Blood pressure 162/68, pulse 105, temperature 98.6 F (37 C), temperature source Oral, resp. rate 17, height 5' 8.5" (1.74 m), weight 238 lb (107.956 kg), SpO2 94 %., Body mass index is 35.66 kg/(m^2). General: Well developed, well  nourished, in no acute distress. Head: Normocephalic, atraumatic, eyes without discharge, sclera non-icteric, nares are without discharge. Bilateral auditory canals clear, TM's are without perforation, pearly grey and translucent with reflective cone of light bilaterally. Oral cavity moist, posterior pharynx without exudate, erythema, peritonsillar abscess, or post nasal drip. Missing multiple teeth.  Neck: Supple. No thyromegaly. Full ROM. No  lymphadenopathy. Lungs: very congested cough. Inspiratory and expiratory wheezes bilaterally. No respiratory distress at rest. Heart: RRR with S1 S2. No murmurs, rubs, or gallops appreciated. Abdomen: Soft, non-tender, non-distended with normoactive bowel sounds. No hepatomegaly. No rebound/guarding. No obvious abdominal masses. Msk:  Strength and tone normal for age. Extremities/Skin: Warm and dry. No clubbing or cyanosis. No edema. No rashes or suspicious lesions. Neuro: Alert and oriented X 3. Moves all extremities spontaneously. Gait is normal. CNII-XII grossly in tact. Psych:  Responds to questions appropriately with a normal affect.   ASSESSMENT AND PLAN:  65 y.o. year old female with  1. Acute bronchitis, unspecified organism    This chart was scribed in my presence and reviewed by me personally.    ICD-9-CM ICD-10-CM   1. Acute bronchitis, unspecified organism 466.0 J20.9 albuterol (PROVENTIL) (2.5 MG/3ML) 0.083% nebulizer solution 2.5 mg     ipratropium (ATROVENT) nebulizer solution 0.5 mg     Albuterol Sulfate (PROAIR RESPICLICK) 123XX123 (90 Base) MCG/ACT AEPB     benzonatate (TESSALON) 200 MG capsule     azithromycin (ZITHROMAX) 250 MG tablet    Signed, Robyn Haber, MD 02/12/2016 10:49 AM

## 2016-02-12 NOTE — Patient Instructions (Addendum)
Use Lacri-lube for dry eyes.  Please return if the cough persists more than 2-3 days more.

## 2016-02-21 ENCOUNTER — Ambulatory Visit: Payer: Medicare PPO

## 2016-02-27 ENCOUNTER — Ambulatory Visit (INDEPENDENT_AMBULATORY_CARE_PROVIDER_SITE_OTHER): Payer: Medicare PPO | Admitting: Family Medicine

## 2016-02-27 VITALS — BP 142/70 | HR 90 | Temp 98.5°F | Resp 16 | Ht 68.5 in | Wt 233.0 lb

## 2016-02-27 DIAGNOSIS — J209 Acute bronchitis, unspecified: Secondary | ICD-10-CM

## 2016-02-27 DIAGNOSIS — R05 Cough: Secondary | ICD-10-CM | POA: Diagnosis not present

## 2016-02-27 DIAGNOSIS — R059 Cough, unspecified: Secondary | ICD-10-CM

## 2016-02-27 MED ORDER — LEVOFLOXACIN 500 MG PO TABS: 500.0000 mg | ORAL_TABLET | Freq: Every day | ORAL | Status: DC

## 2016-02-27 MED ORDER — PREDNISONE 20 MG PO TABS: ORAL_TABLET | ORAL | Status: DC

## 2016-02-27 MED ORDER — HYDROCOD POLST-CPM POLST ER 10-8 MG/5ML PO SUER: 5.0000 mL | Freq: Two times a day (BID) | ORAL | Status: DC | PRN

## 2016-02-27 NOTE — Patient Instructions (Addendum)
Please call on Saturday to report your symptoms. You can expect some itching from the cough medicine. Also your blood sugar may go up a little bit with the prednisone.

## 2016-02-27 NOTE — Progress Notes (Signed)
   Subjective:    Patient ID: Lindsey Marquez, female    DOB: 1951-06-05, 65 y.o.   MRN: [id number] By signing my name below, I, Zola Button, attest that this documentation has been prepared under the direction and in the presence of Robyn Haber, MD.  Electronically Signed: Zola Button, Medical Scribe. 02/27/2016. 1:08 PM.  HPI HPI Comments: Lindsey Marquez is a 65 y.o. female with a history of DM who presents to the [facility] for a follow-up for acute bronchitis. Patient was last seen by me 15 days ago with cough, wheezing, and SOB. She was treated with inhalers, Tessalon, and azithromycin. Her symptoms have still not improved. She reports having a violent cough which had been productive of yellow sputum which left a sour taste in her mouth for 1 day. She reports having abdominal pain radiating around to her back due to the cough. Patient states her DM has been well-controlled.  Review of Systems  Respiratory: Positive for cough.   Gastrointestinal: Positive for abdominal pain.  Musculoskeletal: Positive for back pain.       Objective:   Physical Exam CONSTITUTIONAL: Well developed/well nourished HEAD: Normocephalic/atraumatic EYES: EOM/PERRL ENMT: Mucous membranes moist NECK: supple no meningeal signs SPINE: entire spine nontender CV: S1/S2 noted, no murmurs/rubs/gallops noted LUNGS: Rales in the right base ABDOMEN: soft, nontender, no rebound or guarding GU: no cva tenderness NEURO: Pt is awake/alert, moves all extremitiesx4 EXTREMITIES: pulses normal, full ROM SKIN: warm, color normal PSYCH: no abnormalities of mood noted        Assessment & Plan:    This chart was scribed in my presence and reviewed by me personally.    ICD-9-CM ICD-10-CM   1. Acute bronchitis, unspecified organism 466.0 J20.9 levofloxacin (LEVAQUIN) 500 MG tablet     chlorpheniramine-HYDROcodone (TUSSIONEX PENNKINETIC ER) 10-8 MG/5ML SUER     predniSONE (DELTASONE) 20 MG  tablet  2. Cough 786.2 R05 levofloxacin (LEVAQUIN) 500 MG tablet     chlorpheniramine-HYDROcodone (TUSSIONEX PENNKINETIC ER) 10-8 MG/5ML SUER     predniSONE (DELTASONE) 20 MG tablet     Signed, Robyn Haber, MD

## 2016-03-11 ENCOUNTER — Ambulatory Visit: Payer: Medicare PPO | Admitting: Internal Medicine

## 2016-03-24 ENCOUNTER — Ambulatory Visit (INDEPENDENT_AMBULATORY_CARE_PROVIDER_SITE_OTHER): Payer: Medicare PPO | Admitting: Internal Medicine

## 2016-03-24 ENCOUNTER — Encounter: Payer: Self-pay | Admitting: Internal Medicine

## 2016-03-24 VITALS — BP 144/84 | HR 89 | Temp 98.3°F | Ht 68.5 in | Wt 236.0 lb

## 2016-03-24 DIAGNOSIS — Z794 Long term (current) use of insulin: Secondary | ICD-10-CM

## 2016-03-24 DIAGNOSIS — E119 Type 2 diabetes mellitus without complications: Secondary | ICD-10-CM

## 2016-03-24 DIAGNOSIS — C50912 Malignant neoplasm of unspecified site of left female breast: Secondary | ICD-10-CM | POA: Diagnosis not present

## 2016-03-24 DIAGNOSIS — I10 Essential (primary) hypertension: Secondary | ICD-10-CM

## 2016-03-24 DIAGNOSIS — E1165 Type 2 diabetes mellitus with hyperglycemia: Secondary | ICD-10-CM | POA: Insufficient documentation

## 2016-03-24 NOTE — Progress Notes (Signed)
Pre visit review using our clinic review tool, if applicable. No additional management support is needed unless otherwise documented below in the visit note. 

## 2016-03-24 NOTE — Progress Notes (Signed)
HPI  Pt presents to the clinic today to establish care and for management of the conditions listed below. She is transferring care from Dr. Baird [facility] at [facility].  DM 2: Fasting sugars are always less then 100. She is taking Glyburide-Metformin as directed. She denies hypoglycemia. Her last eye exam was last year, scheduled for 03/2016. Flu 08/2015. She is not sure if she ever had her pneumonia vaccine.  HTN: Her BP is 144/84, but she reports she has not taken her Lisinopril-HCT today. She denies chest pain, chest tightness or shortness of breath.  Breast Cancer, in remission: Last Mammogram was 12/2014, she has one scheduled for next month.  Flu: 10/2015 Tetanus: unsure Prevnar: 01/2016 Pap Smear: 01/2016, Dr. Baird [facility]. She does not know the results Mammogram: 01/2015, scheduled 03/2016 [facility] Colon Screening: 2012 Vision Screening: yearly Dentist: yearly  Past Medical History  Diagnosis Date  . Breast cancer [facility]) August 2002    Invasive ductal carcinoma Left breast.  . Family history of malignant neoplasm of ovary   . Family history of pancreatic cancer   . Diabetes mellitus without complication [facility])     Current Outpatient Prescriptions  Medication Sig Dispense Refill  . Albuterol Sulfate (PROAIR RESPICLICK) 123XX123 (90 Base) MCG/ACT AEPB Inhale 2 puffs into the lungs every 6 (six) hours as needed. 1 each 3  . aspirin 81 MG tablet Take 81 mg by mouth daily.     Marland Kitchen glyBURIDE-metformin (GLUCOVANCE) 2.5-500 MG per tablet Take 1 tablet by mouth 2 (two) times daily with a meal.      . ibuprofen (ADVIL,MOTRIN) 200 MG tablet Take 200 mg by mouth every 8 (eight) hours as needed.      Marland Kitchen lisinopril-hydrochlorothiazide (PRINZIDE,ZESTORETIC) 20-25 MG per tablet Take 1 tablet by mouth daily.      . predniSONE (DELTASONE) 20 MG tablet Two daily with food 10 tablet 0  . tobramycin (TOBREX) 0.3 % ophthalmic solution Place 1 drop into both eyes every 4 (four) hours. 5 mL 0    No current facility-administered medications for this visit.    Allergies  Allergen Reactions  . Codeine     Family History  Problem Relation Age of Onset  . Prostate cancer Father   . Ovarian cancer Maternal Aunt 77    deceased  . Cancer Maternal Uncle     "bone ca"; unk. primary; deceased 52s  . Pancreatic cancer Mother 57    deceased  . Cancer Maternal Aunt     2 other mat aunts; unk. primary in 32s; deceased  . Prostate cancer Maternal Uncle     deceased 71    Social History   Social History  . Marital Status: Divorced    Spouse Name: N/A  . Number of Children: N/A  . Years of Education: N/A   Occupational History  . Not on file.   Social History Main Topics  . Smoking status: Never Smoker   . Smokeless tobacco: Never Used  . Alcohol Use: No  . Drug Use: No  . Sexual Activity: Not on file   Other Topics Concern  . Not on file   Social History Narrative    ROS:  Constitutional: Denies fever, malaise, fatigue, headache or abrupt weight changes.  HEENT: Denies eye pain, eye redness, ear pain, ringing in the ears, wax buildup, runny nose, nasal congestion, bloody nose, or sore throat. Respiratory: Denies difficulty breathing, shortness of breath, cough or sputum production.   Cardiovascular: Denies chest  pain, chest tightness, palpitations or swelling in the hands or feet.  Gastrointestinal: Denies abdominal pain, bloating, constipation, diarrhea or blood in the stool.  Skin: Denies redness, rashes, lesions or ulcercations.  Neurological: Denies dizziness, difficulty with memory, difficulty with speech or problems with balance and coordination.  Psych: Denies anxiety, depression, SI/HI.  No other specific complaints in a complete review of systems (except as listed in HPI above).  PE:  BP 144/84 mmHg  Pulse 89  Temp(Src) 98.3 F (36.8 C) (Oral)  Ht 5' 8.5" (1.74 m)  Wt 236 lb (107.049 kg)  BMI 35.36 kg/m2  SpO2 98% Wt Readings from Last 3  Encounters:  03/24/16 236 lb (107.049 kg)  02/27/16 233 lb (105.688 kg)  02/12/16 238 lb (107.956 kg)    General: Appears her stated age, obese in NAD. Skin: Dry and intact. Cardiovascular: Normal rate and rhythm. S1,S2 noted.  No murmur, rubs or gallops noted. No carotid bruits noted. Pulmonary/Chest: Normal effort and positive vesicular breath sounds. No respiratory distress. No wheezes, rales or ronchi noted.  Neurological: Alert and oriented. Sensation intact to BLE. Psychiatric: Mood and affect normal. Behavior is normal. Judgment and thought content normal.     BMET    Component Value Date/Time   NA 139 12/14/2013 0848   NA 139 12/03/2011 0929   K 3.8 12/14/2013 0848   K 4.3 12/03/2011 0929   CL 104 12/12/2012 1031   CL 102 12/03/2011 0929   CO2 26 12/14/2013 0848   CO2 27 12/03/2011 0929   GLUCOSE 153* 12/14/2013 0848   GLUCOSE 130* 12/12/2012 1031   GLUCOSE 136* 12/03/2011 0929   BUN 14.1 12/14/2013 0848   BUN 14 12/03/2011 0929   CREATININE 0.8 12/14/2013 0848   CREATININE 0.76 12/03/2011 0929   CALCIUM 9.4 12/14/2013 0848   CALCIUM 9.5 12/03/2011 0929    Lipid Panel  No results found for: CHOL, TRIG, HDL, CHOLHDL, VLDL, LDLCALC  CBC    Component Value Date/Time   WBC 7.8 12/14/2013 0849   RBC 3.84 12/14/2013 0849   HGB 12.7 12/14/2013 0849   HCT 37.7 12/14/2013 0849   PLT 362 12/14/2013 0849   MCV 98.3 12/14/2013 0849   MCH 33.0 12/14/2013 0849   MCHC 33.6 12/14/2013 0849   RDW 13.1 12/14/2013 0849   LYMPHSABS 2.4 12/14/2013 0849   MONOABS 0.6 12/14/2013 0849   EOSABS 0.3 12/14/2013 0849   BASOSABS 0.1 12/14/2013 0849    Hgb A1C No results found for: HGBA1C   Assessment and Plan:

## 2016-03-24 NOTE — Assessment & Plan Note (Signed)
In remission Following yearly mammograms 

## 2016-03-24 NOTE — Assessment & Plan Note (Signed)
Will get recent labs from prior PCP Continue Glyburide and Metformin Eye exam scheduled Foot exam today Flu shot UTD Will request immunizations to see if she has received pneumonia vaccine

## 2016-03-24 NOTE — Assessment & Plan Note (Signed)
Controlled on Lisinopril-HCT Will request recent labs from previous PCP

## 2016-03-24 NOTE — Patient Instructions (Signed)

## 2016-04-01 ENCOUNTER — Ambulatory Visit
Admission: RE | Admit: 2016-04-01 | Discharge: 2016-04-01 | Disposition: A | Discharge status: Home / Self Care | Payer: Medicare PPO | Source: Ambulatory Visit

## 2016-04-01 DIAGNOSIS — Z1231 Encounter for screening mammogram for malignant neoplasm of breast: Secondary | ICD-10-CM

## 2016-04-03 ENCOUNTER — Other Ambulatory Visit: Payer: Self-pay | Admitting: Internal Medicine

## 2016-04-03 DIAGNOSIS — R928 Other abnormal and inconclusive findings on diagnostic imaging of breast: Secondary | ICD-10-CM

## 2016-04-08 LAB — HM DIABETES EYE EXAM

## 2016-04-13 ENCOUNTER — Ambulatory Visit
Admission: RE | Admit: 2016-04-13 | Discharge: 2016-04-13 | Disposition: A | Discharge status: Home / Self Care | Payer: Medicare PPO | Source: Ambulatory Visit | Attending: Internal Medicine | Admitting: Internal Medicine

## 2016-04-13 ENCOUNTER — Other Ambulatory Visit: Payer: Self-pay | Admitting: Internal Medicine

## 2016-04-13 DIAGNOSIS — R921 Mammographic calcification found on diagnostic imaging of breast: Secondary | ICD-10-CM

## 2016-04-13 DIAGNOSIS — R928 Other abnormal and inconclusive findings on diagnostic imaging of breast: Secondary | ICD-10-CM

## 2016-04-17 ENCOUNTER — Ambulatory Visit
Admission: RE | Admit: 2016-04-17 | Discharge: 2016-04-17 | Disposition: A | Discharge status: Home / Self Care | Payer: Medicare PPO | Source: Ambulatory Visit | Attending: Internal Medicine | Admitting: Internal Medicine

## 2016-04-17 ENCOUNTER — Other Ambulatory Visit: Payer: Self-pay | Admitting: Internal Medicine

## 2016-04-17 DIAGNOSIS — R921 Mammographic calcification found on diagnostic imaging of breast: Secondary | ICD-10-CM

## 2016-04-20 ENCOUNTER — Other Ambulatory Visit: Payer: Self-pay | Admitting: Surgery

## 2016-04-20 DIAGNOSIS — C50912 Malignant neoplasm of unspecified site of left female breast: Secondary | ICD-10-CM

## 2016-04-21 ENCOUNTER — Encounter: Payer: Self-pay | Admitting: Internal Medicine

## 2016-04-22 ENCOUNTER — Ambulatory Visit
Admission: RE | Admit: 2016-04-22 | Discharge: 2016-04-22 | Disposition: A | Discharge status: Home / Self Care | Payer: Medicare PPO | Source: Ambulatory Visit | Attending: Surgery | Admitting: Surgery

## 2016-04-22 ENCOUNTER — Other Ambulatory Visit: Payer: Self-pay | Admitting: Internal Medicine

## 2016-04-22 ENCOUNTER — Telehealth: Payer: Self-pay | Admitting: Internal Medicine

## 2016-04-22 ENCOUNTER — Other Ambulatory Visit: Payer: Self-pay | Admitting: Surgery

## 2016-04-22 DIAGNOSIS — C50912 Malignant neoplasm of unspecified site of left female breast: Secondary | ICD-10-CM

## 2016-04-22 DIAGNOSIS — Z1211 Encounter for screening for malignant neoplasm of colon: Secondary | ICD-10-CM

## 2016-04-22 NOTE — Telephone Encounter (Signed)
I did get the records from the breast center. I have not received the records from Dr. Tye Savoy but will place referral to [facility] for colonoscopy

## 2016-04-22 NOTE — Telephone Encounter (Signed)
There was an error on the number to Dr Collene Mares [facility]---refaxed records release today for Dr Collene Mares

## 2016-04-22 NOTE — Telephone Encounter (Signed)
Patient called to find out if [place] received old records from [employer].  Patient wanted to know if you received the records for the Breast Center.  Patient is due for a Colonoscopy.  Her last Colonoscopy was done in [place] can't remember the physician's name.  She'd like to get an appointment with [facility].

## 2016-04-24 ENCOUNTER — Other Ambulatory Visit: Payer: Self-pay | Admitting: Surgery

## 2016-04-24 DIAGNOSIS — C50912 Malignant neoplasm of unspecified site of left female breast: Secondary | ICD-10-CM

## 2016-04-28 ENCOUNTER — Encounter: Payer: Self-pay | Admitting: Internal Medicine

## 2016-04-28 ENCOUNTER — Telehealth: Payer: Self-pay | Admitting: Gastroenterology

## 2016-04-28 NOTE — Telephone Encounter (Signed)
5/30 records received from PCP - routed to Dr. Silverio Decamp for review for consideration for colonoscopy.

## 2016-04-30 ENCOUNTER — Telehealth: Payer: Self-pay | Admitting: Hematology

## 2016-04-30 NOTE — Telephone Encounter (Signed)
Per staff message from navigator schedule patient with GM/VG/YF at the request of NG - patient being discussed at tumor board. Spoke with patient re new patient appointment with Dr. Burr Medico 05/08/2016 @ 11 am to arrive at 10:30 am. Patient demographics and insurance information confirmed - not a [facility] patient. Date/time to patient date/time restrictions - navigator aware.

## 2016-05-01 ENCOUNTER — Telehealth: Payer: Self-pay | Admitting: *Deleted

## 2016-05-01 NOTE — Telephone Encounter (Signed)
Received appt date/time from Audie Clear.  Mailed new pt packet to pt.

## 2016-05-04 ENCOUNTER — Telehealth: Payer: Self-pay | Admitting: Internal Medicine

## 2016-05-04 NOTE — Telephone Encounter (Signed)
Patient called.  Patient said she's due for a colonoscopy.  Her last colonoscopy was done by Dr.Mann.  Patient would like to have Colonoscopy done at [facility].  Patient can go anytime after 05/11/16.

## 2016-05-04 NOTE — Telephone Encounter (Signed)
This is something we discuss at annual exams. Have her make an appt for physical.

## 2016-05-05 ENCOUNTER — Ambulatory Visit
Admission: RE | Admit: 2016-05-05 | Discharge: 2016-05-05 | Disposition: A | Discharge status: Home / Self Care | Payer: Medicare PPO | Source: Ambulatory Visit | Attending: Surgery | Admitting: Surgery

## 2016-05-05 DIAGNOSIS — C50912 Malignant neoplasm of unspecified site of left female breast: Secondary | ICD-10-CM

## 2016-05-05 MED ORDER — GADOBENATE DIMEGLUMINE 529 MG/ML IV SOLN
20.0000 mL | Freq: Once | INTRAVENOUS | Status: AC | PRN
  Administered 2016-05-05: 20 mL via INTRAVENOUS

## 2016-05-06 ENCOUNTER — Other Ambulatory Visit: Payer: Self-pay | Admitting: Surgery

## 2016-05-06 DIAGNOSIS — C50912 Malignant neoplasm of unspecified site of left female breast: Secondary | ICD-10-CM

## 2016-05-06 DIAGNOSIS — R590 Localized enlarged lymph nodes: Secondary | ICD-10-CM

## 2016-05-06 NOTE — Telephone Encounter (Signed)
appt scheduled for [facility] 05/25/16

## 2016-05-07 ENCOUNTER — Encounter: Payer: Self-pay | Admitting: Gastroenterology

## 2016-05-07 DIAGNOSIS — C50112 Malignant neoplasm of central portion of left female breast: Secondary | ICD-10-CM | POA: Insufficient documentation

## 2016-05-07 NOTE — Telephone Encounter (Signed)
Dr. Silverio Decamp reviewed records and has accepted patient. Ok to schedule Direct Colon. Patient states that she will have to callback to schedule.

## 2016-05-07 NOTE — Progress Notes (Signed)
[place]  [phone] [phone]  Clinic Follow Up Note   Patient Care Team: [name], NP as PCP - General (Internal Medicine) 05/08/2016  Referring physician: Dr. Georgette Dover   CHIEF COMPLAINTS/PURPOSE OF CONSULTATION:  Recurrent left breast cancer   Oncology History   Inflammatory breast cancer, triple negative   Primary site: Breast (Left)   Staging method: AJCC 7th Edition   Clinical: Stage IIIB (T4d, N1, cM0) signed by [name], MD on 12/14/2013  9:43 AM   Pathologic: Stage IIIB (T4d, N1a, cM0) signed by [name], MD on 12/14/2013  9:43 AM   Summary: Stage IIIB (T4d, N1a, cM0)       History of left breast cancer   09/12/2002 Imaging CT scan show no evidence of disease apart from the left axilla   09/2002 -  Neo-Adjuvant Chemotherapy TAC X4 cycles    01/10/2003 Surgery The patient had left lumpectomy and axillary lymph node dissection which show residual breast cancer and a sentinel lymph node was positive   2004 -  Adjuvant Chemotherapy Cytoxan with 5-FU x4 cycles (methotrexate added at cycle 4).   2004 -  Radiation Therapy Adjuvant left breast radiation   12/2002 Receptors her2 ER, PR and HER-2 were all negative.    Cancer of central portion of left breast ([place])   04/13/2016 Mammogram Scheduler coarse heterogeneous calcifications spanning an area of 3.7 cm in the lumpectomy site, indeterminate. Ultrasound of the left axilla was negative.   04/17/2016 Initial Diagnosis Cancer of central portion of left breast ([place])   04/17/2016 Initial Biopsy Left breast core needle biopsy showed invasive and in situ ductal carcinoma with calcification, grade 2.   04/17/2016 Receptors her2 Your 100% positive, PR 15% positive, strong staining, Ki-67 10%, HER-2 positive by IHC (3)   05/05/2016 Imaging Bilateral breast MRI showed a 1.3 x 0.8 x 0.7 cm biopsy-proven ductal carcinoma in the region of the previous lumpectomy. Enlarged lobulated right inferior  axillary lymph node with diffuse cortical thickening, biopsy is recommended.   Status post neoadjuvant chemotherapy with TAC x4 cycles.  2. Status post left lumpectomy with axillary lymph node dissection. Residual tumor 1 cm ER PR negative and HER- 2/neu negative. 3. Status post adjuvant radiation therapy to the left breast.  4. Adjuvant Cytoxan with 5-FU x4 cycles (methotrexate added at cycle 4).    HISTORY OF PRESENTING ILLNESS:  Lindsey Marquez 65 y.o. female is here because of her recurrent left breast cancer.  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 She was seen by multiple medical oncologist in our practice in the past, last was seen by Dr. Alvy Bimler in 11/2013.  She has been doing well, and is compliant with annual screening mammogram. The screening mammogram on 04/17/2069 showed calcification in the left breast, and core needle biopsy showed invasive and in situ ductal carcinoma, grade 2, ER/PR positive, HER-2 amplified. She was referred to seeing breast surgeon Dr. Georgette Dover and referred back to [place].   She feels well, she did not feel any lump in her breast or axillas latley. She denies any pain, dyspnea, or GI symptoms. She had right nipple rash and right axillary swelling in the past one week, after she drinks some diet tea with citric. Breast MRI showed an enlarged lobulated right inferior axillary lymph node with diffuse cortical thickening, in addition to the known left breast mass which measured  1.3 cm on MRI.  GYN HISTORY  Menarchal: 12 LMP: 2003 Contraceptive: 20 HRT: n/a  G2P2: 65 yo on and 91 yo son who died    MEDICAL HISTORY:  Past Medical History  Diagnosis Date  . Breast cancer [facility]) August 2002    Invasive ductal carcinoma Left breast.  . Family history of malignant neoplasm of ovary   . Family history of pancreatic cancer   .  Diabetes mellitus without complication ([place])     SURGICAL HISTORY: Past Surgical History  Procedure Laterality Date  . Foot surgery  2000  . Tubal ligation  22 yrs since  2004.    Bilateral.  . Breast surgery Left 2003    SOCIAL HISTORY: Social History   Social History  . Marital Status: Divorced    Spouse Name: N/A  . Number of Children: N/A  . Years of Education: N/A   Occupational History  . Not on file.   Social History Main Topics  . Smoking status: Never Smoker   . Smokeless tobacco: Never Used  . Alcohol Use: No  . Drug Use: No  . Sexual Activity: Yes   Other Topics Concern  . Not on file   Social History Narrative   She is retired from school   FAMILY HISTORY: Family History  Problem Relation Age of Onset  . Prostate cancer Father   . Ovarian cancer Maternal Aunt 35    deceased  . Cancer Maternal Uncle     "bone ca"; unk. primary; deceased 56s  . Pancreatic cancer Mother 13    deceased  . Cancer Maternal Aunt     2 other mat aunts; unk. primary in 87s; deceased  . Prostate cancer Maternal Uncle     deceased 40    ALLERGIES:  is allergic to codeine.  MEDICATIONS:  Current Outpatient Prescriptions  Medication Sig Dispense Refill  . aspirin 81 MG tablet Take 81 mg by mouth daily.     Marland Kitchen glyBURIDE-metformin (GLUCOVANCE) 2.5-500 MG per tablet Take 1 tablet by mouth 2 (two) times daily with a meal.      . ibuprofen (ADVIL,MOTRIN) 200 MG tablet Take 200 mg by mouth every 8 (eight) hours as needed.      Marland Kitchen lisinopril-hydrochlorothiazide (PRINZIDE,ZESTORETIC) 20-25 MG per tablet Take 1 tablet by mouth daily.      . Omega-3 Fatty Acids (FISH OIL) 1200 MG CAPS Take 1 capsule by mouth daily.    . Potassium 99 MG TABS Take 1 tablet by mouth daily.     No current facility-administered medications for this visit.    REVIEW OF SYSTEMS:   Constitutional: Denies fevers, chills or abnormal night sweats Eyes: Denies blurriness of vision, double vision or  watery eyes Ears, nose, mouth, throat, and face: Denies mucositis or sore throat Respiratory: Denies cough, dyspnea or wheezes Cardiovascular: Denies palpitation, chest discomfort or lower extremity swelling Gastrointestinal:  Denies nausea, heartburn or change in bowel habits Skin: Denies abnormal skin rashes Lymphatics: Denies new lymphadenopathy or easy bruising Neurological:Denies numbness, tingling or new weaknesses Behavioral/Psych: Mood is stable, no new changes  All other systems were reviewed with the patient and are negative.  PHYSICAL EXAMINATION: ECOG PERFORMANCE STATUS: 0 - Asymptomatic  Filed Vitals:   05/08/16 1048  BP: 161/66  Pulse: 99  Temp: 98 F (36.7 C)  Resp: 20   Filed Weights   05/08/16 1048  Weight: 232 lb 6.4 oz (105.416 kg)    GENERAL:alert, no distress and comfortable SKIN: skin color,  texture, turgor are normal, no rashes or significant lesions EYES: normal, conjunctiva are pink and non-injected, sclera clear OROPHARYNX:no exudate, no erythema and lips, buccal mucosa, and tongue normal  NECK: supple, thyroid normal size, non-tender, without nodularity LYMPH:  no palpable lymphadenopathy in the cervical, axillary or inguinal LUNGS: clear to auscultation and percussion with normal breathing effort HEART: regular rate & rhythm and no murmurs and no lower extremity edema ABDOMEN:abdomen soft, non-tender and normal bowel sounds Musculoskeletal:no cyanosis of digits and no clubbing  PSYCH: alert & oriented x 3 with fluent speech NEURO: no focal motor/sensory deficits Breasts: Breast inspection showed them to be symmetrical with no nipple discharge. There is old surgical scar in the left upper outer quadrant close to areola, there are a few nodules in the scar tissue under the  scar, the largest one measuring about 2 x 1 cm. Palpation of the right breast and both axilla revealed no obvious mass that I could appreciate.   LABORATORY DATA:  I have  reviewed the data as listed CBC Latest Ref Rng 05/08/2016 12/14/2013 12/12/2012  WBC 3.9 - 10.3 10e3/uL 8.3 7.8 7.5  Hemoglobin 11.6 - 15.9 g/dL 12.8 12.7 12.9  Hematocrit 34.8 - 46.6 % 38.5 37.7 37.9  Platelets 145 - 400 10e3/uL 362 362 368   CMP Latest Ref Rng 05/08/2016 12/14/2013 12/12/2012  Glucose 70 - 140 mg/dl 92 153(H) 130(H)  BUN 7.0 - 26.0 mg/dL 10.6 14.1 14.0  Creatinine 0.6 - 1.1 mg/dL 0.8 0.8 0.8  Sodium 136 - 145 mEq/L 139 139 138  Potassium 3.5 - 5.1 mEq/L 3.9 3.8 4.1  Chloride 98 - 107 mEq/L - - 104  CO2 22 - 29 mEq/L _0 Calcium 8.4 - 10.4 mg/dL 9.7 9.4 9.6  Total Protein 6.4 - 8.3 g/dL 8.3 7.7 8.2  Total Bilirubin 0.20 - 1.20 mg/dL 0.51 0.70 0.74  Alkaline Phos 40 - 150 U/L 63 59 65  AST 5 - 34 U/L 61(H) 62(H) 53(H)  ALT 0 - 55 U/L 68(H) 79(H) 66(H)    PATHOLOGY REPORT  Diagnosis 04/17/2016 Breast, left, needle core biopsy, inner mid breast - INVASIVE DUCTAL CARCINOMA WITH CALCIFICATIONS, SEE COMMENT. - DUCTAL CARCINOMA IN SITU WITH COMEDONECROSIS. Microscopic Comment While grading is best performed on the resection specimen the invasive carcinoma appears grade 2. Prognostic markers will be ordered and reported in an addendum. Dr. Lyndon Code has reviewed the case. The case was called to [facility] on 04/20/2016.  Results: HER2 - *EQUIVOCAL* (Her2 by IHC will be ordered.) RATIO OF HER2/CEP17 SIGNALS 1.73 AVERAGE HER2 COPY NUMBER PER CELL 4.15 By immunohistochemistry, the tumor cells are positive for Her2 (3).  Results: IMMUNOHISTOCHEMICAL AND MORPHOMETRIC ANALYSIS PERFORMED MANUALLY Estrogen Receptor: 100%, POSITIVE, STRONG STAINING INTENSITY Progesterone Receptor: 15%, POSITIVE, STRONG STAINING INTENSITY Proliferation Marker Ki67: 10%    RADIOGRAPHIC STUDIES: I have personally reviewed the radiological images as listed and agreed with the findings in the report. 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 LABS:  Creatinine was obtained on site at [facility] at [address] Results: Creatinine 0.7 mg/dL. EXAM: BILATERAL BREAST MRI WITH AND WITHOUT CONTRAST TECHNIQUE: Multiplanar, multisequence MR images of both breasts were obtained prior to and following the intravenous administration of 20  ml of MultiHance. THREE-DIMENSIONAL MR IMAGE RENDERING ON INDEPENDENT WORKSTATION: Three-dimensional MR images were rendered by post-processing of the original MR data on an independent workstation. The three-dimensional MR images were interpreted, and findings are reported in the following complete MRI report for this study. Three dimensional images were evaluated at the independent DynaCad workstation COMPARISON:  Recent mammogram and biopsy examinations. Previous breast MR dated 12/17/2006. FINDINGS: Breast composition: b. Scattered fibroglandular tissue. Background parenchymal enhancement: Minimal. Right breast: No mass or abnormal enhancement. Left breast: 1.3 x 0.8 x 0.7 cm enhancing mass in the central portion of the breast. This is not mildly irregular and contains a biopsy marker clip artifact posteriorly and superiorly. No additional masses or areas of abnormal enhancement. Stable post lumpectomy changes in the central portion of the breast. Lymph nodes: Enlarged, lobulated right axillary lymph node with diffuse cortical thickening, measuring 3.2 x 2.0 x 1.8 cm in maximum dimensions. There was a much smaller normal appearing lymph node at that location in 2008. No adenopathy elsewhere. Ancillary findings:  None. IMPRESSION: 1. 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 2.  Enlarged, lobulated right inferior axillary lymph node with diffuse cortical thickening. This is a new finding since the previous MR dated 12/17/2006. This could represent a reactive lymph node or a lymph node involved with malignancy such as metastatic disease or lymphoproliferative disease. RECOMMENDATION: Right axillary ultrasound and possible ultrasound-guided core needle biopsy of the interval enlarged, lobulated right inferior axillary lymph node with diffuse cortical thickening. BI-RADS CATEGORY  0: Incomplete. Need additional imaging evaluation and/or prior mammograms for comparison. Electronically Signed   By: Claudie Revering M.D.   On: 05/05/2016 13:36   [place] Extrem Up Left Ltd  04/22/2016  CLINICAL DATA:  History of left lumpectomy for grade 3 invasive ductal carcinoma after neoadjuvant treatment in 2004. Patient had 1/10 axillary lymph nodes positive for metastases. 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 Evaluate left axilla for adenopathy. EXAM: ULTRASOUND LEFT UPPER EXTREMITY LIMITED TECHNIQUE: Ultrasound examination of the upper extremity soft tissues was performed in the area of clinical concern. COMPARISON:  04/17/2016 and earlier FINDINGS: I palpate no abnormality in the left axilla. There is a well-healed axillary incision. Ultrasound of the left axilla is negative, demonstrating no enlarged lymph nodes or mass. IMPRESSION: No axillary adenopathy by ultrasound. RECOMMENDATION: Treatment plan for known left breast cancer. BI-RADS: 1: Negative. Electronically Signed   By: Nolon Nations M.D.   On: 04/22/2016 09:25   Mm Digital Diagnostic Unilat L  04/17/2016  CLINICAL DATA:  Patient is post stereotactic core needle biopsy of indeterminate microcalcifications adjacent the lumpectomy site in the inner midportion of the left breast. EXAM: DIAGNOSTIC LEFT MAMMOGRAM POST STEREOTACTIC BIOPSY  COMPARISON:  Previous exam(s). FINDINGS: Mammographic images were obtained following stereotactic guided biopsy of the targeted microcalcifications adjacent the lumpectomy site in the inner midportion of the left breast. Images demonstrates satisfactory placement of a coil shaped metallic clip over the biopsied microcalcifications along the inferior lateral aspect of the lumpectomy site. IMPRESSION: Satisfactory clip placement post left breast stereotactic core biopsy. Final Assessment: Post Procedure Mammograms for Marker Placement Electronically Signed   By: Marin Olp M.D.   On: 04/17/2016 09:25   Mm Digital Diagnostic Unilat L  04/13/2016  CLINICAL DATA:  Patient was called back  from screening mammogram for left breast calcifications. The patient has a history of left breast cancer status post lumpectomy and radiation therapy in 2004. EXAM: DIGITAL DIAGNOSTIC LEFT MAMMOGRAM COMPARISON:  Previous exam(s). ACR Breast Density Category b: There are scattered areas of fibroglandular density. FINDINGS: Magnification views of the 12 o'clock region of the left breast was performed. There are developing scattered coarse heterogeneous calcifications spanning an area of 3.7 cm in the lumpectomy site. They are indeterminate. There is no suspicious mass. IMPRESSION: Indeterminate calcifications in the left breast. RECOMMENDATION: Stereotactic biopsy of the left breast calcifications is recommended. The procedure has been scheduled 04/17/2016. I have discussed the findings and recommendations with the patient. Results were also provided in writing at the conclusion of the visit. If applicable, a reminder letter will be sent to the patient regarding the next appointment. BI-RADS CATEGORY  4: Suspicious. Electronically Signed   By: Lillia Mountain M.D.   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 This was found to be concordant by Dr. Marin Olp. Pathology results were discussed with the patient by telephone. The patient reported doing well after the biopsy. Post biopsy instructions and care were reviewed and questions were answered. The patient was encouraged to call [facility] for any additional concerns. Surgical consultation has been arranged with Dr. [name] at [facility] on Apr 24, 2016. The patient needs a left axillary ultrasound, and I have contacted Dr. Georgette Dover for an order. This will be scheduled. Pathology results reported by Susa Raring RN, BSN on 04/20/2016. Electronically Signed   By: Marin Olp M.D.   On: 04/20/2016 11:45  04/20/2016  CLINICAL DATA:  Patient presents for stereotactic core needle biopsy of a group of indeterminate microcalcifications adjacent the lumpectomy site of the inner mid portion of the left breast. Previous malignant left lumpectomy 2004 with radiation. Benign stereotactic left breast biopsy 2009 at the lumpectomy site. EXAM: LEFT BREAST STEREOTACTIC CORE NEEDLE BIOPSY COMPARISON:  Previous exams. FINDINGS: The patient and I discussed the procedure of stereotactic-guided biopsy including benefits and alternatives. We discussed the high likelihood of a successful procedure. We discussed the risks of the procedure including infection, bleeding, tissue injury, clip migration, and inadequate sampling. Informed written consent was given. The usual time out protocol was performed immediately prior to the procedure. Using sterile technique and 1% Lidocaine as local anesthetic, under stereotactic guidance, a 9 gauge vacuum assisted biopsy device was used to perform core needle biopsy of the targeted microcalcifications along the inferior lateral aspect of the left lumpectomy site using a superior to inferior approach.  Five adequate tissue specimens were obtained. Specimen radiograph was performed showing several of the targeted microcalcifications. Specimens with calcifications are identified for pathology. At the conclusion of the procedure, a coil shaped tissue marker clip was deployed into the biopsy cavity. Follow-up 2-view mammogram was performed and dictated separately. IMPRESSION: Stereotactic-guided biopsy of indeterminate left breast microcalcifications. No apparent complications. Electronically Signed: By: Marin Olp M.D. On: 04/17/2016 09:22    ASSESSMENT & PLAN:  65 year old African-American female, postmenopausal, history of left breast triple negative cancer in 2003, presented with mammogram discovered left breast cancer, triple positive.  1. Cancer of central portion of left breast, cT1cN4m, G2, triple positive -I  reviewed her mammograms, ultrasound, and MRI scan findings and left breast biopsy pathology findings with her in details -She is scheduled to have right axilla ultrasound and needle biopsy was enlarged lymph node which was found on MRI. The MRI did not see any mass in the right breast. The enlarged lymph nodes could be related to her recent right breast rash, however metastatic disease needs to be ruled out.  -The molecular features of her breast cancer is different from previous left breast cancer, this is not a local recurrence. -Depend on the right axillary nodal biopsy, we'll decide if we need staging scan. -If her lymph node biopsy is negative, she will likely procedure with left mastectomy, due to her prior history of left lumpectomy and radiation.  -We discussed her that HER-2 positive breast cancer more aggressive, and I recommend adjuvant chemotherapy with HER-2 antibody after her left breast surgery. The chemotherapy regimen will be determined [place] on her final surgical stage. -I recommend her to have a port placement during the left breast surgery. -Given her strong ER/PR  positivity, I would also recommend adjuvant aromatase inhibitor for 5-10 years.  2. DM and HTN  -She'll continue follow-up with her primary care physician  Plan -She is scheduled for right axillary ultrasound and lymph node biopsy next week -If nodal biopsy negative, she will proceed with left mastectomy with port placement -I'll see her in 2-3 weeks after her surgery, to finalize her adjuvant chemotherapy -If nodal biopsy positive, I'll see her back, and obtain staging scans.  All questions were answered. The patient knows to call the clinic with any problems, questions or concerns. I spent 55 minutes counseling the patient face to face. The total time spent in the appointment was 60 minutes and more than 50% was on counseling.     [name], MD 05/08/2016 11:13 AM

## 2016-05-08 ENCOUNTER — Ambulatory Visit (HOSPITAL_BASED_OUTPATIENT_CLINIC_OR_DEPARTMENT_OTHER): Payer: Medicare PPO | Admitting: Hematology

## 2016-05-08 ENCOUNTER — Encounter: Payer: Self-pay | Admitting: *Deleted

## 2016-05-08 ENCOUNTER — Encounter: Payer: Self-pay | Admitting: Hematology

## 2016-05-08 ENCOUNTER — Other Ambulatory Visit (HOSPITAL_BASED_OUTPATIENT_CLINIC_OR_DEPARTMENT_OTHER): Payer: Medicare PPO

## 2016-05-08 VITALS — BP 161/66 | HR 99 | Temp 98.0°F | Resp 20 | Ht 68.5 in | Wt 232.4 lb

## 2016-05-08 DIAGNOSIS — Z170 Estrogen receptor positive status [ER+]: Secondary | ICD-10-CM

## 2016-05-08 DIAGNOSIS — Z853 Personal history of malignant neoplasm of breast: Secondary | ICD-10-CM

## 2016-05-08 DIAGNOSIS — C50112 Malignant neoplasm of central portion of left female breast: Secondary | ICD-10-CM

## 2016-05-08 DIAGNOSIS — E119 Type 2 diabetes mellitus without complications: Secondary | ICD-10-CM

## 2016-05-08 DIAGNOSIS — I10 Essential (primary) hypertension: Secondary | ICD-10-CM

## 2016-05-08 LAB — COMPREHENSIVE METABOLIC PANEL
ALT: 68 U/L — ABNORMAL HIGH (ref 0–55)
AST: 61 U/L — ABNORMAL HIGH (ref 5–34)
Albumin: 3.9 g/dL (ref 3.5–5.0)
Alkaline Phosphatase: 63 U/L (ref 40–150)
Anion Gap: 8 mEq/L (ref 3–11)
BUN: 10.6 mg/dL (ref 7.0–26.0)
CO2: 27 mEq/L (ref 22–29)
Calcium: 9.7 mg/dL (ref 8.4–10.4)
Chloride: 104 mEq/L (ref 98–109)
Creatinine: 0.8 mg/dL (ref 0.6–1.1)
EGFR: >90 mL/min/{1.73_m2} (ref >90)
Glucose: 92 mg/dl (ref 70–140)
Potassium: 3.9 mEq/L (ref 3.5–5.1)
Sodium: 139 mEq/L (ref 136–145)
Total Bilirubin: 0.51 mg/dL (ref 0.20–1.20)
Total Protein: 8.3 g/dL (ref 6.4–8.3)

## 2016-05-08 LAB — CBC WITH DIFFERENTIAL/PLATELET
BASO%: 0.8 % (ref 0.0–2.0)
Basophils Absolute: 0.1 10*3/uL (ref 0.0–0.1)
EOS%: 2.6 % (ref 0.0–7.0)
Eosinophils Absolute: 0.2 10*3/uL (ref 0.0–0.5)
HCT: 38.5 % (ref 34.8–46.6)
HGB: 12.8 g/dL (ref 11.6–15.9)
LYMPH%: 29.3 % (ref 14.0–49.7)
MCH: 32.4 pg (ref 25.1–34.0)
MCHC: 33.2 g/dL (ref 31.5–36.0)
MCV: 97.6 fL (ref 79.5–101.0)
MONO#: 0.6 10*3/uL (ref 0.1–0.9)
MONO%: 7.2 % (ref 0.0–14.0)
NEUT#: 5 10*3/uL (ref 1.5–6.5)
NEUT%: 60.1 % (ref 38.4–76.8)
Platelets: 362 10*3/uL (ref 145–400)
RBC: 3.95 10*6/uL (ref 3.70–5.45)
RDW: 13 % (ref 11.2–14.5)
WBC: 8.3 10*3/uL (ref 3.9–10.3)
lymph#: 2.4 10*3/uL (ref 0.9–3.3)

## 2016-05-09 ENCOUNTER — Encounter: Payer: Self-pay | Admitting: Hematology

## 2016-05-15 ENCOUNTER — Ambulatory Visit
Admission: RE | Admit: 2016-05-15 | Discharge: 2016-05-15 | Disposition: A | Discharge status: Home / Self Care | Payer: BC Managed Care – PPO | Source: Ambulatory Visit | Attending: Surgery | Admitting: Surgery

## 2016-05-15 ENCOUNTER — Ambulatory Visit
Admission: RE | Admit: 2016-05-15 | Discharge: 2016-05-15 | Disposition: A | Discharge status: Home / Self Care | Payer: Medicare PPO | Source: Ambulatory Visit | Attending: Surgery | Admitting: Surgery

## 2016-05-15 ENCOUNTER — Other Ambulatory Visit: Payer: Self-pay | Admitting: Surgery

## 2016-05-15 DIAGNOSIS — C50112 Malignant neoplasm of central portion of left female breast: Secondary | ICD-10-CM | POA: Insufficient documentation

## 2016-05-15 DIAGNOSIS — R590 Localized enlarged lymph nodes: Secondary | ICD-10-CM

## 2016-05-15 DIAGNOSIS — Z923 Personal history of irradiation: Secondary | ICD-10-CM | POA: Insufficient documentation

## 2016-05-18 DIAGNOSIS — E66811 Obesity, class 1: Secondary | ICD-10-CM | POA: Insufficient documentation

## 2016-05-18 DIAGNOSIS — E669 Obesity, unspecified: Secondary | ICD-10-CM | POA: Insufficient documentation

## 2016-05-19 ENCOUNTER — Ambulatory Visit: Payer: Self-pay | Admitting: Surgery

## 2016-05-19 NOTE — H&P (Signed)
The patient is a 65 year old female who presents with breast cancer. Referred by Dr. Daniel Boyle for recurrent left breast cancer PCP - Baity, NP  This is a 65-year-old female who is status post diagnosis of left inflammatory breast cancer. This measured 4 cm and was diagnosed in October 2003. She underwent neoadjuvant chemotherapy with TAC 4 cycles. Subsequently, she underwent left lumpectomy with axillary lymph node dissection by Dr. Kristen Earle. There was 1 cm of residual tumor triple negative. 1/10 axillary LN positive. She subsequently underwent radiation therapy to the left breast followed by 4 cycles of Cytoxan, 5-FU, and methotrexate.  She has undergone genetic testing which was negative.  Recently she underwent routine mammogram on 04/01/16. This showed suspicious microcalcifications in the left breast. Diagnostic mammogram showed a 3.7 cm area of scattered calcifications at 12:00 in the previous lumpectomy site. This was performed on 04/13/16. 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 Left axillary ultrasound was unremarkable.  She underwent MRI to determine the extent of disease and to examine the right side.  This raised the question of an enlarged right axillary lymph node.  Biopsy was negative.  She has met with oncology who recommends a left mastectomy with port placement for adjuvant chemotherapy.  She met with plastic surgery but has decided against immediate reconstruction.  CLINICAL DATA: Screening.  EXAM: 2D DIGITAL SCREENING BILATERAL MAMMOGRAM WITH CAD AND ADJUNCT TOMO  COMPARISON: Previous exam(s).  ACR Breast Density Category b: There are scattered areas of fibroglandular density.  FINDINGS: In the left breast, calcifications warrant further evaluation. In the right breast, no findings suspicious for malignancy. Images were processed with CAD.  IMPRESSION: Further  evaluation is suggested for calcifications in the left breast.  RECOMMENDATION: Diagnostic mammogram of the left breast. (Code:[id number])  The patient will be contacted regarding the findings, and additional imaging will be scheduled.  BI-RADS CATEGORY 0: Incomplete. Need additional imaging evaluation and/or prior mammograms for comparison.   Electronically Signed By: Michelle Collins M.D. On: 04/02/2016 09:20  CLINICAL DATA: Patient was called back from screening mammogram for left breast calcifications. The patient has a history of left breast cancer status post lumpectomy and radiation therapy in 2004.  EXAM: DIGITAL DIAGNOSTIC LEFT MAMMOGRAM  COMPARISON: Previous exam(s).  ACR Breast Density Category b: There are scattered areas of fibroglandular density.  FINDINGS: Magnification views of the 12 o'clock region of the left breast was performed. There are developing scattered coarse heterogeneous calcifications spanning an area of 3.7 cm in the lumpectomy site. They are indeterminate. There is no suspicious mass.  IMPRESSION: Indeterminate calcifications in the left breast.  RECOMMENDATION: Stereotactic biopsy of the left breast calcifications is recommended. The procedure has been scheduled 04/17/2016.  I have discussed the findings and recommendations with the patient. Results were also provided in writing at the conclusion of the visit. If applicable, a reminder letter will be sent to the patient regarding the next appointment.  BI-RADS CATEGORY 4: Suspicious.   Electronically Signed By: Dina Arceo M.D. On: 04/13/2016 09:10  CLINICAL DATA: Patient presents for stereotactic core needle biopsy of a group of indeterminate microcalcifications adjacent the lumpectomy site of the inner mid portion of the left breast. Previous malignant left lumpectomy 2004 with radiation. Benign stereotactic left breast biopsy 2009 at the lumpectomy  site.  EXAM: LEFT BREAST STEREOTACTIC CORE NEEDLE BIOPSY  COMPARISON: Previous exams.  FINDINGS: The patient and I discussed the procedure of   stereotactic-guided biopsy including benefits and alternatives. We discussed the high likelihood of a successful procedure. We discussed the risks of the procedure including infection, bleeding, tissue injury, clip migration, and inadequate sampling. Informed written consent was given. The usual time out protocol was performed immediately prior to the procedure.  Using sterile technique and 1% Lidocaine as local anesthetic, under stereotactic guidance, a 9 gauge vacuum assisted biopsy device was used to perform core needle biopsy of the targeted microcalcifications along the inferior lateral aspect of the left lumpectomy site using a superior to inferior approach. Five adequate tissue specimens were obtained. Specimen radiograph was performed showing several of the targeted microcalcifications. Specimens with calcifications are identified for pathology.  At the conclusion of the procedure, a coil shaped tissue marker clip was deployed into the biopsy cavity. Follow-up 2-view mammogram was performed and dictated separately.  IMPRESSION: Stereotactic-guided biopsy of indeterminate left breast microcalcifications. No apparent complications.  Electronically Signed: By: Daniel Boyle M.D. On: 04/17/2016 09:22  CLINICAL DATA: History of left lumpectomy for grade 3 invasive ductal carcinoma after neoadjuvant treatment in 2004. Patient had 1/10 axillary lymph nodes positive for metastases. 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 Evaluate left axilla for adenopathy.  EXAM: ULTRASOUND LEFT UPPER EXTREMITY LIMITED  TECHNIQUE: Ultrasound examination of the upper extremity soft tissues was performed in the area of clinical  concern.  COMPARISON: 04/17/2016 and earlier  FINDINGS: I palpate no abnormality in the left axilla. There is a well-healed axillary incision. Ultrasound of the left axilla is negative, demonstrating no enlarged lymph nodes or mass.  IMPRESSION: No axillary adenopathy by ultrasound.  RECOMMENDATION: Treatment plan for known left breast cancer.  BI-RADS: 1: Negative.   Electronically Signed By: Elizabeth Brown M.D. 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  LABS: Creatinine was obtained on site at [facility] at [address]  Results: Creatinine 0.7 mg/dL.  EXAM: BILATERAL BREAST MRI WITH AND WITHOUT CONTRAST  TECHNIQUE: Multiplanar, multisequence MR images of both breasts were obtained prior to and following the intravenous administration of 20 ml of MultiHance.  THREE-DIMENSIONAL MR IMAGE RENDERING ON INDEPENDENT WORKSTATION:  Three-dimensional MR images were rendered by post-processing of the original MR data on an independent workstation. The three-dimensional MR images were interpreted, and findings are reported in the following complete MRI report for this study. Three dimensional images were evaluated at the independent DynaCad workstation  COMPARISON: Recent mammogram and biopsy examinations. Previous breast MR dated 12/17/2006.  FINDINGS: Breast composition: b. Scattered fibroglandular tissue.  Background parenchymal enhancement: Minimal.  Right breast: No mass or abnormal enhancement.  Left breast: 1.3 x 0.8 x 0.7 cm enhancing mass in the central portion of the breast. This is not mildly irregular and contains a biopsy marker clip artifact posteriorly and superiorly. No additional masses or areas of  abnormal enhancement. Stable post lumpectomy changes in the central portion of the breast.  Lymph nodes: Enlarged, lobulated right axillary lymph node with diffuse cortical thickening, measuring 3.2 x 2.0 x 1.8 cm in maximum dimensions. There was a much smaller normal appearing lymph node at that location in 2008. No adenopathy elsewhere.  Ancillary findings: None.  IMPRESSION: 1. 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 2. Enlarged, lobulated right inferior axillary lymph node with diffuse cortical thickening. This is a new finding since the previous MR dated 12/17/2006. This could represent a reactive lymph node or a lymph node involved with malignancy such as metastatic disease or lymphoproliferative disease.  RECOMMENDATION: Right axillary ultrasound and possible ultrasound-guided core needle biopsy of the interval enlarged, lobulated right inferior axillary lymph node with diffuse cortical thickening.  BI-RADS CATEGORY 0: Incomplete. Need additional imaging evaluation and/or prior mammograms for comparison.   Electronically Signed  By: Steven Reid M.D.  On: 05/05/2016 13:36   Diagnosis Breast, left, needle core biopsy, inner mid breast - INVASIVE DUCTAL CARCINOMA WITH CALCIFICATIONS, SEE COMMENT. - DUCTAL CARCINOMA IN SITU WITH COMEDONECROSIS. Microscopic Comment While grading is best performed on the resection specimen the invasive carcinoma appears grade 2. Prognostic markers will be ordered and reported in an addendum. Dr. Kish has reviewed the case. The case was called to [facility]  on 04/20/2016. JULIA MANNY MD Pathologist, Electronic Signature (Case signed 04/20/2016)  PROGNOSTIC INDICATORS Results: IMMUNOHISTOCHEMICAL AND MORPHOMETRIC ANALYSIS PERFORMED MANUALLY Estrogen Receptor: 100%, POSITIVE,  STRONG STAINING INTENSITY Progesterone Receptor: 15%, POSITIVE, STRONG STAINING INTENSITY Proliferation Marker Ki67: 10% REFERENCE RANGE ESTROGEN RECEPTOR NEGATIVE 0% POSITIVE =>1% REFERENCE RANGE PROGESTERONE RECEPTOR NEGATIVE 0% POSITIVE =>1%  Results: HER2 - *EQUIVOCAL* (Her2 by IHC will be ordered.) RATIO OF HER2/CEP17 SIGNALS 1.73 AVERAGE HER2 COPY NUMBER PER CELL 4.15 Reference Range: NEGATIVE HER2/CEP17 Ratio <2.0 and average HER2 copy number <4.0 EQUIVOCAL HER2/CEP17 Ratio <2.0 and average HER2 copy number >=4.0 and <6.0 POSITIVE HER2/CEP17 Ratio >=2.0 or <2.0 and average HER2 copy number >=6.0   Other Problems (Kimberly F Turpin, LPN; 04/24/2016 11:13 AM) Breast Cancer High blood pressure  Past Surgical History (Kimberly F Turpin, LPN; 04/24/2016 11:13 AM) Foot Surgery Bilateral.  Diagnostic Studies History (Kimberly F Turpin, LPN; 04/24/2016 11:13 AM) Colonoscopy 1-5 years ago Mammogram within last year  Allergies (Kimberly F Turpin, LPN; 04/24/2016 11:14 AM) Codeine/Codeine Derivatives  Medication History (Kimberly F Turpin, LPN; 04/24/2016 11:16 AM) GlyBURIDE-MetFORMIN (2.5-500MG Tablet, Oral) Active. MetFORMIN HCl (500MG Tablet, Oral) Active. Lisinopril-Hydrochlorothiazide (20-25MG Tablet, Oral) Active. Aspirin (81MG Tablet DR, Oral) Active. Ibuprofen (200MG Capsule, Oral) Active. Fish Oil (300MG Capsule, Oral) Active. Vitamin D3 (1000UNIT Capsule, Oral) Active. Medications Reconciled  Social History (Kimberly F Turpin, LPN; 04/24/2016 11:13 AM) No alcohol use Tobacco use Never smoker.  Family History (Kimberly F Turpin, LPN; 04/24/2016 11:13 AM) Hypertension Father. Malignant Neoplasm Of Pancreas Mother.  Pregnancy / Birth History (Kimberly F Turpin, LPN; 04/24/2016 11:13 AM) Age of menopause [age] Irregular periods Maternal age 21-25 Para 2    Review of Systems (Kimberly F. Turpin LPN; 04/24/2016 11:13 AM) General Present-  Weight Loss. Not Present- Appetite Loss, Chills, Fatigue, Fever, Night Sweats and Weight Gain. Skin Not Present- Change in Wart/Mole, Dryness, Hives, Jaundice, New Lesions, Non-Healing Wounds, Rash and Ulcer. Respiratory Not Present- Bloody sputum, Chronic Cough, Difficulty Breathing, Snoring and Wheezing. Breast Present- Breast Mass. Not Present- Breast Pain, Nipple Discharge and Skin Changes. Cardiovascular Present- Leg Cramps. Not Present- Chest Pain, Difficulty Breathing Lying Down, Palpitations, Rapid Heart Rate, Shortness of Breath and Swelling of Extremities. Gastrointestinal Not Present- Abdominal Pain, Bloating, Bloody Stool, Change in Bowel Habits, Chronic diarrhea, Constipation, Difficulty Swallowing, Excessive gas, Gets full quickly at meals, Hemorrhoids, Indigestion, Nausea, Rectal Pain and Vomiting. Musculoskeletal Present- Joint Pain. Not Present- Back Pain, Joint Stiffness,   Muscle Pain, Muscle Weakness and Swelling of Extremities. Neurological Not Present- Decreased Memory, Fainting, Headaches, Numbness, Seizures, Tingling, Tremor, Trouble walking and Weakness. Psychiatric Not Present- Anxiety, Bipolar, Change in Sleep Pattern, Depression, Fearful and Frequent crying. Hematology Not Present- Easy Bruising, Excessive bleeding, Gland problems, HIV and Persistent Infections.   Physical Exam (Geordie Nooney K. Delos Klich MD; 04/24/2016 12:19 PM) The physical exam findings are as follows: Note:WDWN in NAD HEENT: EOMI, sclera anicteric Neck: No masses, no thyromegaly Breasts: Right breast shows no palpable masses, no nipple retraction or discharge, no axillary lymphadenopathy.  Left breast shows a healed axillary dissection scar with no palpable lymphadenopathy. The upper central breast shows scarring from previous lumpectomy and radiation. There is slight thickening around the scar but no palpable dominant masses. No nipple discharge. Lungs: CTA bilaterally; normal respiratory effort CV: Regular  rate and rhythm; no murmurs Abd: +bowel sounds, soft, non-tender, no masses Ext: Well-perfused; no edema Skin: Warm, dry; no sign of jaundice    Assessment & Plan (Aleyah Balik K. Ieasha Boerema MD; 04/24/2016 12:22 PM) RECURRENT BREAST CANCER, LEFT (C50.912) Current Plans   Left mastectomy and right port placement.  She has already had a lymph node dissection.  The surgical procedure has been discussed with the patient.  Potential risks, benefits, alternative treatments, and expected outcomes have been explained.  All of the patient's questions at this time have been answered.  The likelihood of reaching the patient's treatment goal is good.  The patient understand the proposed surgical procedure and wishes to proceed.  Corderius Saraceni K. Jaskarn Schweer, MD, FACS [facility]  General/ Trauma Surgery  05/19/2016 11:43 AM     

## 2016-05-22 ENCOUNTER — Encounter: Payer: Self-pay | Admitting: *Deleted

## 2016-05-22 ENCOUNTER — Telehealth: Payer: Self-pay | Admitting: Hematology

## 2016-05-22 DIAGNOSIS — C50912 Malignant neoplasm of unspecified site of left female breast: Secondary | ICD-10-CM

## 2016-05-22 NOTE — Telephone Encounter (Signed)
sch appt per 6/23 pof. Spoke with patient to confirm appt date/times

## 2016-05-25 ENCOUNTER — Ambulatory Visit: Payer: Medicare PPO | Admitting: Internal Medicine

## 2016-05-26 ENCOUNTER — Ambulatory Visit: Payer: Medicare PPO | Admitting: Hematology

## 2016-05-26 ENCOUNTER — Encounter (HOSPITAL_COMMUNITY)
Admission: RE | Admit: 2016-05-26 | Discharge: 2016-05-26 | Disposition: A | Discharge status: Home / Self Care | Payer: Medicare PPO | Source: Ambulatory Visit | Attending: Surgery | Admitting: Surgery

## 2016-05-26 ENCOUNTER — Encounter (HOSPITAL_COMMUNITY): Payer: Self-pay

## 2016-05-26 ENCOUNTER — Other Ambulatory Visit (HOSPITAL_COMMUNITY): Payer: BC Managed Care – PPO

## 2016-05-26 DIAGNOSIS — R9431 Abnormal electrocardiogram [ECG] [EKG]: Secondary | ICD-10-CM | POA: Diagnosis not present

## 2016-05-26 DIAGNOSIS — E119 Type 2 diabetes mellitus without complications: Secondary | ICD-10-CM | POA: Diagnosis not present

## 2016-05-26 DIAGNOSIS — E669 Obesity, unspecified: Secondary | ICD-10-CM | POA: Diagnosis not present

## 2016-05-26 DIAGNOSIS — I059 Rheumatic mitral valve disease, unspecified: Secondary | ICD-10-CM | POA: Diagnosis not present

## 2016-05-26 DIAGNOSIS — C50912 Malignant neoplasm of unspecified site of left female breast: Secondary | ICD-10-CM | POA: Diagnosis not present

## 2016-05-26 DIAGNOSIS — I119 Hypertensive heart disease without heart failure: Secondary | ICD-10-CM | POA: Diagnosis not present

## 2016-05-26 DIAGNOSIS — Z6834 Body mass index (BMI) 34.0-34.9, adult: Secondary | ICD-10-CM | POA: Diagnosis not present

## 2016-05-26 DIAGNOSIS — Z01810 Encounter for preprocedural cardiovascular examination: Secondary | ICD-10-CM | POA: Diagnosis not present

## 2016-05-26 LAB — BASIC METABOLIC PANEL
Anion gap: 3 — ABNORMAL LOW (ref 5–15)
BUN: 9 mg/dL (ref 6–20)
CO2: 24 mmol/L (ref 22–32)
Calcium: 8.9 mg/dL (ref 8.9–10.3)
Chloride: 110 mmol/L (ref 101–111)
Creatinine, Ser: 0.77 mg/dL (ref 0.44–1.00)
GFR calc Af Amer: >60 mL/min (ref >60)
GFR calc non Af Amer: >60 mL/min (ref >60)
Glucose, Bld: 194 mg/dL — ABNORMAL HIGH (ref 65–99)
Potassium: 3.9 mmol/L (ref 3.5–5.1)
Sodium: 137 mmol/L (ref 135–145)

## 2016-05-26 LAB — CBC
HCT: 35.9 % — ABNORMAL LOW (ref 36.0–46.0)
Hemoglobin: 11.9 g/dL — ABNORMAL LOW (ref 12.0–15.0)
MCH: 31.7 pg (ref 26.0–34.0)
MCHC: 33.1 g/dL (ref 30.0–36.0)
MCV: 95.7 fL (ref 78.0–100.0)
Platelets: 328 10*3/uL (ref 150–400)
RBC: 3.75 MIL/uL — ABNORMAL LOW (ref 3.87–5.11)
RDW: 13 % (ref 11.5–15.5)
WBC: 8 10*3/uL (ref 4.0–10.5)

## 2016-05-26 LAB — GLUCOSE, CAPILLARY: Glucose-Capillary: 177 mg/dL — ABNORMAL HIGH (ref 65–99)

## 2016-05-26 MED ORDER — CHLORHEXIDINE GLUCONATE CLOTH 2 % EX PADS: 6.0000 | MEDICATED_PAD | Freq: Once | CUTANEOUS | Status: DC

## 2016-05-26 NOTE — Pre-Procedure Instructions (Signed)
    Lindsey Marquez  05/26/2016      [id number] - [place], [place] [address] Phone: [phone] Fax: [phone]  [id number] - [place], [place] [address] [place] [address] Phone: [phone] Fax: [phone]  [id number] - [place], [place] [place] [address] Phone: [phone] Fax: [phone]    Your procedure is scheduled on 05/28/16.  Report to [facility] Admitting at 10 A.M.  Call this number if you have problems the morning of surgery:  [phone]   Remember:  Do not eat food or drink liquids after midnight.  Take these medicines the morning of surgery with A SIP OF WATER --none Do not take any aspirin,anti-inflammatories,vitamins,or herbal supplements 5-7 days prior to surgery.  Do not wear jewelry, make-up or nail polish.  Do not wear lotions, powders, or perfumes.  You may wear deoderant.  Do not shave 48 hours prior to surgery.  Men may shave face and neck.  Do not bring valuables to the hospital.  [facility] is not responsible for any belongings or valuables.  Contacts, dentures or bridgework may not be worn into surgery.  Leave your suitcase in the car.  After surgery it may be brought to your room.  For patients admitted to the hospital, discharge time will be determined by your treatment team.  Patients discharged the day of surgery will not be allowed to drive home.   Name and phone number of your driver:   Special instructions:    Please read over the following fact sheets that you were given.

## 2016-05-27 ENCOUNTER — Encounter (HOSPITAL_COMMUNITY): Payer: Self-pay

## 2016-05-27 ENCOUNTER — Ambulatory Visit (HOSPITAL_COMMUNITY)
Admission: RE | Admit: 2016-05-27 | Discharge: 2016-05-27 | Disposition: A | Discharge status: Home / Self Care | Payer: Medicare PPO | Source: Ambulatory Visit | Attending: Hematology | Admitting: Hematology

## 2016-05-27 DIAGNOSIS — E669 Obesity, unspecified: Secondary | ICD-10-CM | POA: Insufficient documentation

## 2016-05-27 DIAGNOSIS — C50912 Malignant neoplasm of unspecified site of left female breast: Secondary | ICD-10-CM | POA: Insufficient documentation

## 2016-05-27 DIAGNOSIS — Z01810 Encounter for preprocedural cardiovascular examination: Secondary | ICD-10-CM | POA: Diagnosis not present

## 2016-05-27 DIAGNOSIS — Z6834 Body mass index (BMI) 34.0-34.9, adult: Secondary | ICD-10-CM | POA: Insufficient documentation

## 2016-05-27 DIAGNOSIS — I059 Rheumatic mitral valve disease, unspecified: Secondary | ICD-10-CM | POA: Insufficient documentation

## 2016-05-27 DIAGNOSIS — R9431 Abnormal electrocardiogram [ECG] [EKG]: Secondary | ICD-10-CM | POA: Insufficient documentation

## 2016-05-27 DIAGNOSIS — E119 Type 2 diabetes mellitus without complications: Secondary | ICD-10-CM | POA: Insufficient documentation

## 2016-05-27 DIAGNOSIS — I119 Hypertensive heart disease without heart failure: Secondary | ICD-10-CM | POA: Insufficient documentation

## 2016-05-27 LAB — ECHOCARDIOGRAM COMPLETE
E decel time: 137 msec
E/e' ratio: 9.02
FS: 25 % — AB (ref 28–44)
IVS/LV PW RATIO, ED: 1.22
LA ID, A-P, ES: 43 mm
LA diam end sys: 43 mm
LA diam index: 1.95 cm/m2
LA vol A4C: 54.5 ml
LA vol index: 24.4 mL/m2
LA vol: 54 mL
LV E/e' medial: 9.02
LV E/e'average: 9.02
LV PW d: 11.1 mm — AB (ref 0.6–1.1)
LV dias vol index: 34 mL/m2
LV dias vol: 76 mL (ref 46–106)
LV e' LATERAL: 9.36 cm/s
LV sys vol index: 16 mL/m2
LV sys vol: 35 mL (ref 14–42)
LVOT area: 3.46 cm2
LVOT diameter: 21 mm
MV Dec: 137
MV Peak grad: 3 mmHg
MV pk A vel: 92.2 m/s
MV pk E vel: 84.4 m/s
Simpson's disk: 54
Stroke v: 41 ml
TDI e' lateral: 9.36
TDI e' medial: 7.94

## 2016-05-27 LAB — HEMOGLOBIN A1C
Hgb A1c MFr Bld: 7.8 % — ABNORMAL HIGH (ref 4.8–5.6)
Mean Plasma Glucose: 177 mg/dL

## 2016-05-27 MED ORDER — PERFLUTREN LIPID MICROSPHERE
INTRAVENOUS | Status: AC
  Filled 2016-05-27: qty 10

## 2016-05-27 MED ORDER — PERFLUTREN LIPID MICROSPHERE
1.0000 mL | INTRAVENOUS | Status: AC | PRN
  Administered 2016-05-27: 1 mL via INTRAVENOUS
  Administered 2016-05-27: 3 mL via INTRAVENOUS
  Filled 2016-05-27: qty 10

## 2016-05-27 MED ORDER — CEFAZOLIN SODIUM-DEXTROSE 2-4 GM/100ML-% IV SOLN
2.0000 g | INTRAVENOUS | Status: AC
  Administered 2016-05-28: 2 g via INTRAVENOUS
  Filled 2016-05-27: qty 100

## 2016-05-27 NOTE — Progress Notes (Signed)
  Echocardiogram 2D Echocardiogram with Definity has been performed.  Tresa Res 05/27/2016, 12:23 PM

## 2016-05-28 ENCOUNTER — Ambulatory Visit (HOSPITAL_COMMUNITY): Payer: Medicare PPO

## 2016-05-28 ENCOUNTER — Ambulatory Visit (HOSPITAL_COMMUNITY): Payer: Medicare PPO | Admitting: Certified Registered Nurse Anesthetist

## 2016-05-28 ENCOUNTER — Encounter (HOSPITAL_COMMUNITY)
Admission: RE | Disposition: A | Discharge status: Home / Self Care | Payer: Self-pay | Source: Ambulatory Visit | Attending: Surgery

## 2016-05-28 ENCOUNTER — Ambulatory Visit (HOSPITAL_COMMUNITY)
Admission: RE | Admit: 2016-05-28 | Discharge: 2016-05-30 | Disposition: A | Discharge status: Home / Self Care | Payer: Medicare PPO | Source: Ambulatory Visit | Attending: Surgery | Admitting: Surgery

## 2016-05-28 ENCOUNTER — Encounter (HOSPITAL_COMMUNITY): Payer: Self-pay | Admitting: *Deleted

## 2016-05-28 DIAGNOSIS — Z923 Personal history of irradiation: Secondary | ICD-10-CM | POA: Insufficient documentation

## 2016-05-28 DIAGNOSIS — Z87891 Personal history of nicotine dependence: Secondary | ICD-10-CM | POA: Insufficient documentation

## 2016-05-28 DIAGNOSIS — Z452 Encounter for adjustment and management of vascular access device: Secondary | ICD-10-CM | POA: Insufficient documentation

## 2016-05-28 DIAGNOSIS — I10 Essential (primary) hypertension: Secondary | ICD-10-CM | POA: Insufficient documentation

## 2016-05-28 DIAGNOSIS — E119 Type 2 diabetes mellitus without complications: Secondary | ICD-10-CM | POA: Insufficient documentation

## 2016-05-28 DIAGNOSIS — C50912 Malignant neoplasm of unspecified site of left female breast: Secondary | ICD-10-CM | POA: Diagnosis present

## 2016-05-28 DIAGNOSIS — Z853 Personal history of malignant neoplasm of breast: Secondary | ICD-10-CM | POA: Diagnosis not present

## 2016-05-28 DIAGNOSIS — Z95828 Presence of other vascular implants and grafts: Secondary | ICD-10-CM

## 2016-05-28 DIAGNOSIS — Z419 Encounter for procedure for purposes other than remedying health state, unspecified: Secondary | ICD-10-CM

## 2016-05-28 HISTORY — PX: MASTECTOMY: SHX3

## 2016-05-28 HISTORY — PX: OTHER SURGICAL HISTORY: SHX169

## 2016-05-28 HISTORY — PX: PORTACATH PLACEMENT: SHX2246

## 2016-05-28 HISTORY — PX: TOTAL MASTECTOMY: SHX6129

## 2016-05-28 LAB — CBC
HCT: 36.9 % (ref 36.0–46.0)
Hemoglobin: 12.1 g/dL (ref 12.0–15.0)
MCH: 32.2 pg (ref 26.0–34.0)
MCHC: 32.8 g/dL (ref 30.0–36.0)
MCV: 98.1 fL (ref 78.0–100.0)
Platelets: 326 10*3/uL (ref 150–400)
RBC: 3.76 MIL/uL — ABNORMAL LOW (ref 3.87–5.11)
RDW: 12.9 % (ref 11.5–15.5)
WBC: 8.8 10*3/uL (ref 4.0–10.5)

## 2016-05-28 LAB — CREATININE, SERUM
Creatinine, Ser: 0.67 mg/dL (ref 0.44–1.00)
GFR calc Af Amer: >60 mL/min (ref >60)
GFR calc non Af Amer: >60 mL/min (ref >60)

## 2016-05-28 LAB — GLUCOSE, CAPILLARY
Glucose-Capillary: 154 mg/dL — ABNORMAL HIGH (ref 65–99)
Glucose-Capillary: 183 mg/dL — ABNORMAL HIGH (ref 65–99)

## 2016-05-28 SURGERY — MASTECTOMY, SIMPLE
Anesthesia: Regional | Site: Chest | Laterality: Right

## 2016-05-28 MED ORDER — BUPIVACAINE-EPINEPHRINE (PF) 0.25% -1:200000 IJ SOLN
INTRAMUSCULAR | Status: AC
  Filled 2016-05-28: qty 30

## 2016-05-28 MED ORDER — HYDROMORPHONE HCL 1 MG/ML IJ SOLN
0.2500 mg | INTRAMUSCULAR | Status: DC | PRN
  Administered 2016-05-28: 0.5 mg via INTRAVENOUS

## 2016-05-28 MED ORDER — MEPERIDINE HCL 25 MG/ML IJ SOLN: 6.2500 mg | INTRAMUSCULAR | Status: DC | PRN

## 2016-05-28 MED ORDER — POTASSIUM CHLORIDE IN NACL 20-0.9 MEQ/L-% IV SOLN
INTRAVENOUS | Status: DC
  Administered 2016-05-28 – 2016-05-29 (×2): via INTRAVENOUS
  Filled 2016-05-28 (×2): qty 1000

## 2016-05-28 MED ORDER — LISINOPRIL-HYDROCHLOROTHIAZIDE 20-25 MG PO TABS: 1.0000 | ORAL_TABLET | Freq: Every day | ORAL | Status: DC

## 2016-05-28 MED ORDER — DIPHENHYDRAMINE HCL 12.5 MG/5ML PO ELIX: 12.5000 mg | ORAL_SOLUTION | Freq: Four times a day (QID) | ORAL | Status: DC | PRN

## 2016-05-28 MED ORDER — HYDROCHLOROTHIAZIDE 25 MG PO TABS
25.0000 mg | ORAL_TABLET | Freq: Every day | ORAL | Status: DC
  Administered 2016-05-29 – 2016-05-30 (×2): 25 mg via ORAL
  Filled 2016-05-28 (×2): qty 1

## 2016-05-28 MED ORDER — HEPARIN SOD (PORK) LOCK FLUSH 100 UNIT/ML IV SOLN
INTRAVENOUS | Status: AC
  Filled 2016-05-28: qty 5

## 2016-05-28 MED ORDER — DEXAMETHASONE SODIUM PHOSPHATE 10 MG/ML IJ SOLN
INTRAMUSCULAR | Status: DC | PRN
  Administered 2016-05-28: 5 mg via INTRAVENOUS

## 2016-05-28 MED ORDER — ACETAMINOPHEN 500 MG PO TABS
1000.0000 mg | ORAL_TABLET | Freq: Four times a day (QID) | ORAL | Status: DC
  Administered 2016-05-28 – 2016-05-30 (×8): 1000 mg via ORAL
  Filled 2016-05-28 (×9): qty 2

## 2016-05-28 MED ORDER — ZOLPIDEM TARTRATE 5 MG PO TABS: 5.0000 mg | ORAL_TABLET | Freq: Every evening | ORAL | Status: DC | PRN

## 2016-05-28 MED ORDER — HYDRALAZINE HCL 20 MG/ML IJ SOLN: 10.0000 mg | INTRAMUSCULAR | Status: DC | PRN

## 2016-05-28 MED ORDER — METHOCARBAMOL 500 MG PO TABS
500.0000 mg | ORAL_TABLET | Freq: Four times a day (QID) | ORAL | Status: DC | PRN
  Administered 2016-05-28 – 2016-05-29 (×2): 500 mg via ORAL
  Filled 2016-05-28 (×2): qty 1

## 2016-05-28 MED ORDER — FENTANYL CITRATE (PF) 100 MCG/2ML IJ SOLN
INTRAMUSCULAR | Status: AC
  Filled 2016-05-28: qty 2

## 2016-05-28 MED ORDER — PROPOFOL 10 MG/ML IV BOLUS
INTRAVENOUS | Status: AC
  Filled 2016-05-28: qty 20

## 2016-05-28 MED ORDER — DIPHENHYDRAMINE HCL 50 MG/ML IJ SOLN: 12.5000 mg | Freq: Four times a day (QID) | INTRAMUSCULAR | Status: DC | PRN

## 2016-05-28 MED ORDER — HEPARIN SOD (PORK) LOCK FLUSH 100 UNIT/ML IV SOLN
INTRAVENOUS | Status: DC | PRN
  Administered 2016-05-28: 500 [IU] via INTRAVENOUS

## 2016-05-28 MED ORDER — LIDOCAINE HCL (CARDIAC) 20 MG/ML IV SOLN
INTRAVENOUS | Status: DC | PRN
  Administered 2016-05-28: 100 mg via INTRAVENOUS

## 2016-05-28 MED ORDER — LACTATED RINGERS IV SOLN
INTRAVENOUS | Status: DC
  Administered 2016-05-28 (×2): via INTRAVENOUS

## 2016-05-28 MED ORDER — BUPIVACAINE-EPINEPHRINE 0.25% -1:200000 IJ SOLN
INTRAMUSCULAR | Status: DC | PRN
  Administered 2016-05-28: 20 mL

## 2016-05-28 MED ORDER — LISINOPRIL 20 MG PO TABS
20.0000 mg | ORAL_TABLET | Freq: Every day | ORAL | Status: DC
  Administered 2016-05-29 – 2016-05-30 (×2): 20 mg via ORAL
  Filled 2016-05-28 (×2): qty 1

## 2016-05-28 MED ORDER — METFORMIN HCL 500 MG PO TABS
500.0000 mg | ORAL_TABLET | Freq: Two times a day (BID) | ORAL | Status: DC
  Administered 2016-05-29 – 2016-05-30 (×3): 500 mg via ORAL
  Filled 2016-05-28 (×3): qty 1

## 2016-05-28 MED ORDER — MIDAZOLAM HCL 2 MG/2ML IJ SOLN
INTRAMUSCULAR | Status: AC
  Filled 2016-05-28: qty 2

## 2016-05-28 MED ORDER — IBUPROFEN 600 MG PO TABS
600.0000 mg | ORAL_TABLET | Freq: Four times a day (QID) | ORAL | Status: DC | PRN
  Administered 2016-05-29: 600 mg via ORAL
  Filled 2016-05-28: qty 1

## 2016-05-28 MED ORDER — ENOXAPARIN SODIUM 40 MG/0.4ML ~~LOC~~ SOLN
40.0000 mg | SUBCUTANEOUS | Status: DC
  Administered 2016-05-29 – 2016-05-30 (×2): 40 mg via SUBCUTANEOUS
  Filled 2016-05-28 (×2): qty 0.4

## 2016-05-28 MED ORDER — HYDROMORPHONE HCL 1 MG/ML IJ SOLN
INTRAMUSCULAR | Status: AC
  Filled 2016-05-28: qty 1

## 2016-05-28 MED ORDER — DOCUSATE SODIUM 100 MG PO CAPS
100.0000 mg | ORAL_CAPSULE | Freq: Two times a day (BID) | ORAL | Status: DC
  Administered 2016-05-28 – 2016-05-30 (×4): 100 mg via ORAL
  Filled 2016-05-28 (×4): qty 1

## 2016-05-28 MED ORDER — PROPOFOL 10 MG/ML IV BOLUS
INTRAVENOUS | Status: DC | PRN
  Administered 2016-05-28: 20 mg via INTRAVENOUS
  Administered 2016-05-28: 200 mg via INTRAVENOUS
  Administered 2016-05-28: 20 mg via INTRAVENOUS

## 2016-05-28 MED ORDER — GLYBURIDE 2.5 MG PO TABS
2.5000 mg | ORAL_TABLET | Freq: Two times a day (BID) | ORAL | Status: DC
  Administered 2016-05-29 – 2016-05-30 (×3): 2.5 mg via ORAL
  Filled 2016-05-28 (×4): qty 1

## 2016-05-28 MED ORDER — BUPIVACAINE-EPINEPHRINE (PF) 0.5% -1:200000 IJ SOLN
INTRAMUSCULAR | Status: DC | PRN
  Administered 2016-05-28: 30 mL via PERINEURAL

## 2016-05-28 MED ORDER — PROMETHAZINE HCL 25 MG/ML IJ SOLN: 6.2500 mg | INTRAMUSCULAR | Status: DC | PRN

## 2016-05-28 MED ORDER — MIDAZOLAM HCL 2 MG/2ML IJ SOLN
INTRAMUSCULAR | Status: DC | PRN
  Administered 2016-05-28 (×2): 1 mg via INTRAVENOUS

## 2016-05-28 MED ORDER — FENTANYL CITRATE (PF) 250 MCG/5ML IJ SOLN
INTRAMUSCULAR | Status: AC
  Filled 2016-05-28: qty 5

## 2016-05-28 MED ORDER — CEFAZOLIN SODIUM-DEXTROSE 2-4 GM/100ML-% IV SOLN
2.0000 g | Freq: Three times a day (TID) | INTRAVENOUS | Status: AC
  Administered 2016-05-28: 2 g via INTRAVENOUS
  Filled 2016-05-28: qty 100

## 2016-05-28 MED ORDER — HEPARIN SODIUM (PORCINE) 5000 UNIT/ML IJ SOLN
INTRAMUSCULAR | Status: DC | PRN
  Administered 2016-05-28: 14:00:00

## 2016-05-28 MED ORDER — ONDANSETRON HCL 4 MG/2ML IJ SOLN: 4.0000 mg | Freq: Four times a day (QID) | INTRAMUSCULAR | Status: DC | PRN

## 2016-05-28 MED ORDER — TRAMADOL HCL 50 MG PO TABS
100.0000 mg | ORAL_TABLET | Freq: Four times a day (QID) | ORAL | Status: DC | PRN
  Administered 2016-05-28 – 2016-05-30 (×7): 100 mg via ORAL
  Filled 2016-05-28 (×7): qty 2

## 2016-05-28 MED ORDER — FENTANYL CITRATE (PF) 250 MCG/5ML IJ SOLN
INTRAMUSCULAR | Status: DC | PRN
  Administered 2016-05-28 (×8): 50 ug via INTRAVENOUS

## 2016-05-28 MED ORDER — HYDROMORPHONE HCL 1 MG/ML IJ SOLN
1.0000 mg | INTRAMUSCULAR | Status: DC | PRN
  Administered 2016-05-28: 1 mg via INTRAVENOUS
  Filled 2016-05-28: qty 1

## 2016-05-28 MED ORDER — ONDANSETRON HCL 4 MG/2ML IJ SOLN
INTRAMUSCULAR | Status: DC | PRN
  Administered 2016-05-28: 4 mg via INTRAVENOUS

## 2016-05-28 MED ORDER — ONDANSETRON 4 MG PO TBDP: 4.0000 mg | ORAL_TABLET | Freq: Four times a day (QID) | ORAL | Status: DC | PRN

## 2016-05-28 MED ORDER — 0.9 % SODIUM CHLORIDE (POUR BTL) OPTIME
TOPICAL | Status: DC | PRN
  Administered 2016-05-28: 1000 mL

## 2016-05-28 MED ORDER — GLYBURIDE-METFORMIN 2.5-500 MG PO TABS: 1.0000 | ORAL_TABLET | Freq: Two times a day (BID) | ORAL | Status: DC

## 2016-05-28 SURGICAL SUPPLY — 69 items
APPLIER CLIP 9.375 MED OPEN (MISCELLANEOUS)
BAG DECANTER FOR FLEXI CONT (MISCELLANEOUS) ×3 IMPLANT
BENZOIN TINCTURE PRP APPL 2/3 (GAUZE/BANDAGES/DRESSINGS) ×3 IMPLANT
BINDER BREAST LRG (GAUZE/BANDAGES/DRESSINGS) IMPLANT
BINDER BREAST XLRG (GAUZE/BANDAGES/DRESSINGS) IMPLANT
BIOPATCH RED 1 DISK 7.0 (GAUZE/BANDAGES/DRESSINGS) ×3 IMPLANT
BLADE SURG 11 STRL SS (BLADE) ×3 IMPLANT
BLADE SURG 15 STRL LF DISP TIS (BLADE) ×2 IMPLANT
BLADE SURG 15 STRL SS (BLADE) ×1
CANISTER SUCTION 2500CC (MISCELLANEOUS) ×3 IMPLANT
CHLORAPREP W/TINT 10.5 ML (MISCELLANEOUS) ×3 IMPLANT
CHLORAPREP W/TINT 26ML (MISCELLANEOUS) ×3 IMPLANT
CLIP APPLIE 9.375 MED OPEN (MISCELLANEOUS) IMPLANT
COVER SURGICAL LIGHT HANDLE (MISCELLANEOUS) ×3 IMPLANT
COVER TRANSDUCER ULTRASND GEL (DRAPE) IMPLANT
CRADLE DONUT ADULT HEAD (MISCELLANEOUS) ×3 IMPLANT
DRAIN CHANNEL 19F RND (DRAIN) ×3 IMPLANT
DRAPE C-ARM 42X72 X-RAY (DRAPES) ×3 IMPLANT
DRAPE LAPAROSCOPIC ABDOMINAL (DRAPES) ×3 IMPLANT
DRAPE UTILITY XL STRL (DRAPES) ×6 IMPLANT
DRSG PAD ABDOMINAL 8X10 ST (GAUZE/BANDAGES/DRESSINGS) ×6 IMPLANT
DRSG TEGADERM 4X4.75 (GAUZE/BANDAGES/DRESSINGS) ×3 IMPLANT
ELECT BLADE 4.0 EZ CLEAN MEGAD (MISCELLANEOUS) ×3
ELECT CAUTERY BLADE 6.4 (BLADE) ×3 IMPLANT
ELECT REM PT RETURN 9FT ADLT (ELECTROSURGICAL) ×3
ELECTRODE BLDE 4.0 EZ CLN MEGD (MISCELLANEOUS) ×2 IMPLANT
ELECTRODE REM PT RTRN 9FT ADLT (ELECTROSURGICAL) ×2 IMPLANT
EVACUATOR SILICONE 100CC (DRAIN) ×3 IMPLANT
GAUZE SPONGE 2X2 8PLY STRL LF (GAUZE/BANDAGES/DRESSINGS) ×2 IMPLANT
GAUZE SPONGE 4X4 16PLY XRAY LF (GAUZE/BANDAGES/DRESSINGS) ×3 IMPLANT
GAUZE XEROFORM 1X8 LF (GAUZE/BANDAGES/DRESSINGS) ×3 IMPLANT
GEL ULTRASOUND 20GR AQUASONIC (MISCELLANEOUS) IMPLANT
GLOVE BIO SURGEON STRL SZ7 (GLOVE) ×3 IMPLANT
GLOVE BIO SURGEON STRL SZ7.5 (GLOVE) ×3 IMPLANT
GLOVE BIOGEL PI IND STRL 7.5 (GLOVE) ×4 IMPLANT
GLOVE BIOGEL PI INDICATOR 7.5 (GLOVE) ×2
GLOVE SURG SS PI 7.0 STRL IVOR (GLOVE) ×6 IMPLANT
GOWN STRL REUS W/ TWL LRG LVL3 (GOWN DISPOSABLE) ×4 IMPLANT
GOWN STRL REUS W/TWL LRG LVL3 (GOWN DISPOSABLE) ×2
INTRODUCER COOK 11FR (CATHETERS) IMPLANT
KIT BASIN OR (CUSTOM PROCEDURE TRAY) ×3 IMPLANT
KIT POWER CATH 8FR (Port) ×3 IMPLANT
KIT ROOM TURNOVER OR (KITS) ×3 IMPLANT
NEEDLE HYPO 25GX1X1/2 BEV (NEEDLE) ×3 IMPLANT
NS IRRIG 1000ML POUR BTL (IV SOLUTION) ×3 IMPLANT
PACK GENERAL/GYN (CUSTOM PROCEDURE TRAY) ×3 IMPLANT
PACK SURGICAL SETUP 50X90 (CUSTOM PROCEDURE TRAY) ×3 IMPLANT
PAD ARMBOARD 7.5X6 YLW CONV (MISCELLANEOUS) ×3 IMPLANT
PENCIL BUTTON HOLSTER BLD 10FT (ELECTRODE) ×3 IMPLANT
SET INTRODUCER 12FR PACEMAKER (SHEATH) IMPLANT
SET SHEATH INTRODUCER 10FR (MISCELLANEOUS) IMPLANT
SHEATH COOK PEEL AWAY SET 9F (SHEATH) IMPLANT
SPECIMEN JAR X LARGE (MISCELLANEOUS) ×3 IMPLANT
SPONGE GAUZE 2X2 STER 10/PKG (GAUZE/BANDAGES/DRESSINGS) ×1
SPONGE GAUZE 4X4 12PLY STER LF (GAUZE/BANDAGES/DRESSINGS) ×6 IMPLANT
STAPLER VISISTAT 35W (STAPLE) ×3 IMPLANT
STRIP CLOSURE SKIN 1/2X4 (GAUZE/BANDAGES/DRESSINGS) ×3 IMPLANT
SUT ETHILON 3 0 FSL (SUTURE) ×3 IMPLANT
SUT MNCRL AB 4-0 PS2 18 (SUTURE) ×3 IMPLANT
SUT PROLENE 2 0 SH DA (SUTURE) ×3 IMPLANT
SUT VIC AB 3-0 SH 18 (SUTURE) ×3 IMPLANT
SUT VIC AB 3-0 SH 27 (SUTURE) ×1
SUT VIC AB 3-0 SH 27X BRD (SUTURE) ×2 IMPLANT
SUT VIC AB 3-0 SH 8-18 (SUTURE) ×3 IMPLANT
SYR 20ML ECCENTRIC (SYRINGE) ×6 IMPLANT
SYR 5ML LUER SLIP (SYRINGE) ×6 IMPLANT
SYR CONTROL 10ML LL (SYRINGE) ×3 IMPLANT
TOWEL OR 17X24 6PK STRL BLUE (TOWEL DISPOSABLE) ×3 IMPLANT
TOWEL OR 17X26 10 PK STRL BLUE (TOWEL DISPOSABLE) ×3 IMPLANT

## 2016-05-28 NOTE — Anesthesia Procedure Notes (Addendum)
Anesthesia Regional Block:  Pectoralis block  Pre-Anesthetic Checklist: ,, timeout performed, Correct Patient, Correct Site, Correct Laterality, Correct Procedure, Correct Position, site marked, Risks and benefits discussed, Surgical consent,  Pre-op evaluation,  Post-op pain management  Laterality: Left  Prep: chloraprep       Needles:   Needle Type: Stimiplex     Needle Length: 9cm 9 cm     Additional Needles:  Procedures: ultrasound guided (picture in chart) Pectoralis block Narrative:  Injection made incrementally with aspirations every 5 mL.  Performed by: Personally  Anesthesiologist: Nolon Nations  Additional Notes: Patient tolerated well. Good fascial spread noted.   Procedure Name: LMA Insertion Date/Time: 05/28/2016 12:14 PM Performed by: Merdis Delay Patient Re-evaluated:Patient Re-evaluated prior to inductionOxygen Delivery Method: Circle system utilized Preoxygenation: Pre-oxygenation with 100% oxygen Intubation Type: IV induction LMA: LMA inserted LMA Size: 4.0 Number of attempts: 1 Placement Confirmation: positive ETCO2,  CO2 detector and breath sounds checked- equal and bilateral Tube secured with: Tape Dental Injury: Teeth and Oropharynx as per pre-operative assessment

## 2016-05-28 NOTE — Anesthesia Preprocedure Evaluation (Addendum)
Anesthesia Evaluation  Patient identified by MRN, date of birth, ID band Patient awake    Reviewed: Allergy & Precautions, NPO status , Patient's Chart, lab work & pertinent test results  Airway Mallampati: II  TM Distance: >3 FB Neck ROM: Full    Dental no notable dental hx. (+) Teeth Intact, Dental Advisory Given   Pulmonary former smoker,    Pulmonary exam normal breath sounds clear to auscultation       Cardiovascular hypertension, Pt. on medications Normal cardiovascular exam Rhythm:Regular Rate:Normal     Neuro/Psych PSYCHIATRIC DISORDERS Anxiety negative neurological ROS     GI/Hepatic negative GI ROS, Neg liver ROS,   Endo/Other  diabetes, Type 2  Renal/GU negative Renal ROS     Musculoskeletal negative musculoskeletal ROS (+)   Abdominal   Peds  Hematology negative hematology ROS (+)   Anesthesia Other Findings   Reproductive/Obstetrics                            Anesthesia Physical Anesthesia Plan  ASA: II  Anesthesia Plan: General and Regional   Post-op Pain Management: GA combined w/ Regional for post-op pain   Induction: Intravenous  Airway Management Planned: LMA  Additional Equipment:   Intra-op Plan:   Post-operative Plan: Extubation in OR  Informed Consent: I have reviewed the patients History and Physical, chart, labs and discussed the procedure including the risks, benefits and alternatives for the proposed anesthesia with the patient or authorized representative who has indicated his/her understanding and acceptance.   Dental advisory given  Plan Discussed with: CRNA  Anesthesia Plan Comments:         Anesthesia Quick Evaluation

## 2016-05-28 NOTE — Progress Notes (Signed)
Pt admitted to 6N32 via stretcher from PACU.  Pt AAO X4.  Pt on RA.  Pt has 20G to rt hand with fluids infusing.  PAC inserted to rt chest, not accessed with gauze and transparent dressing.  Pt has incision to lt breast with staples, ABD gauze, and breast binder.  JP to left chest with serosanguineous drainage.  SCDs in place.  Report rcvd from [facility], [place].  Family in waiting area.  Will continue to monitor.

## 2016-05-28 NOTE — Op Note (Signed)
Preop diagnosis: Recurrent left breast cancer Postop diagnosis: Same Procedure performed: #1 left mastectomy #2 right subclavian vein port placement Surgeon:Lashawn Bromwell K. Anesthesia: Gen. Via LMA with pec block Indications: This is a 65 year old female who is 14 years status post left lumpectomy  With axillary lymph node dissection for inflammatory breast cancer. She received chemotherapy and radiation. Genetic testing was negative. Recently she was found to have suspicious microcalcifications at the previous lumpectomy site. Biopsy showed invasive ductal carcinoma and DCIS. She underwent biopsy of a mildly enlarged right axillary lymph node which was negative. She presents now for mastectomy and port placement.  Description of procedure: The patient brought to the operating room and placed in a supine position on the operating table with a roll behind her shoulders. Her right arm was tucked at her side.After an adequate level of general anesthesia was obtained her entire chest was prepped with ChloraPrep and draped sterile fashion. A timeout was taken to ensure the proper patient and proper procedure.  The patient was placed in Trendelenburg.  The right subclavian vein was cannulated with an 18-gauge needle good blood return. The guidewire was passed through the needle under fluoroscopic guidance and went down the right side of mediastinum in the spirit vena cava. The needle was removed. We created a subcutaneous pocket inferior and medial to the insertion site. We created a subcutaneous tunnel between insertion site and the subcutaneous pocket. An 8 [place] power port was assembled and flushed. It was tunneled from the pocket to the insertion site. The port was placed in the pocket and secured with 2-0 Prolene suture. Fluoroscopy was used to estimate the length of the catheter. We cut the catheter at 19 cm.The dilator and breakaway sheath were then Passed over the guidewire through the clavipectoral  fascia to this. Vena cava. The wire and dilator were removed. The catheter was then advanced through the breakaway sheath as it was removed. We were able aspirate blood easily and the catheter flushed easily. Fluoroscopy showed that there were no kinks along the course of the  Catheter. We instilled concentrated heparin solution into the port. We closed our incision with 3-0 Vicryl. 4 Monocryl was used to close the skin a subcutaneous fashion. Steri-Strips and clean dressings were applied.  We then turned our attention to the left side. We outlined an elliptical incision to include her previous lumpectomy site and her entire nipple areolar complex. We made an incision with a #10 blade. We raised subcutaneous flaps up to the infraclavicular space superiorly the inframammary crease inferiorly edge of the sternum medially and anterior edge of the latissimus dorsi laterally. Hemostasis was obtained with cautery. We then dissected the breast tissue off of the pectoralis muscle  Including the anterior fascia with the specimen. We removed the breast specimen and oriented with a long suture lateral and a short suture superior. This was sent for pathologic examination. We irrigated the wound thoroughly and inspected for hemostasis. A 19 [place] Blake drain was then inserted through a stab incision and secured with 3-0 nylon. 3-0 Vicryl is used to close the subcutaneous tissue, pulling some of the skin flap down to the underlying muscle. Staples were used to close the skin. Sterile dressing was applied as well as a breast binder. The patient was then next made about the recovery room in stable condition. All sponge, instrument, and needle counts are correct.  Imogene Burn. Georgette Dover, MD, [facility]  General/ Trauma Surgery  05/28/2016 1:57 PM

## 2016-05-28 NOTE — Interval H&P Note (Signed)
History and Physical Interval Note:  05/28/2016 11:21 AM  Lindsey Marquez  has presented today for surgery, with the diagnosis of RECURRENT LEFT BREAST CANCER  The various methods of treatment have been discussed with the patient and family. After consideration of risks, benefits and other options for treatment, the patient has consented to  Procedure(s): LEFT  MASTECTOMY (Left) INSERTION PORT-A-CATH (N/A) as a surgical intervention .  The patient's history has been reviewed, patient examined, no change in status, stable for surgery.  I have reviewed the patient's chart and labs.  Questions were answered to the patient's satisfaction.     Abdulai Blaylock K.

## 2016-05-28 NOTE — H&P (View-Only) (Signed)
The patient is a 65 year old female who presents with breast cancer. Referred by Dr. Marin Olp for recurrent left breast cancer PCP - Garnette Gunner, NP  This is a 65 year old female who is status post diagnosis of left inflammatory breast cancer. This measured 4 cm and was diagnosed in October 2003. She underwent neoadjuvant chemotherapy with TAC 4 cycles. Subsequently, she underwent left lumpectomy with axillary lymph node dissection by Dr. Glean Hess. There was 1 cm of residual tumor triple negative. 1/10 axillary LN positive. She subsequently underwent radiation therapy to the left breast followed by 4 cycles of Cytoxan, 5-FU, and methotrexate.  She has undergone genetic testing which was negative.  Recently she underwent routine mammogram on 04/01/16. This showed suspicious microcalcifications in the left breast. Diagnostic mammogram showed a 3.7 cm area of scattered calcifications at 12:00 in the previous lumpectomy site. This was performed on 04/13/16. 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 Left axillary ultrasound was unremarkable.  She underwent MRI to determine the extent of disease and to examine the right side.  This raised the question of an enlarged right axillary lymph node.  Biopsy was negative.  She has met with oncology who recommends a left mastectomy with port placement for adjuvant chemotherapy.  She met with plastic surgery but has decided against immediate reconstruction.  CLINICAL DATA: Screening.  EXAM: 2D DIGITAL SCREENING BILATERAL MAMMOGRAM WITH CAD AND ADJUNCT TOMO  COMPARISON: Previous exam(s).  ACR Breast Density Category b: There are scattered areas of fibroglandular density.  FINDINGS: In the left breast, calcifications warrant further evaluation. In the right breast, no findings suspicious for malignancy. Images were processed with CAD.  IMPRESSION: Further  evaluation is suggested for calcifications in the left breast.  RECOMMENDATION: Diagnostic mammogram of the left breast. (Code:[id number])  The patient will be contacted regarding the findings, and additional imaging will be scheduled.  BI-RADS CATEGORY 0: Incomplete. Need additional imaging evaluation and/or prior mammograms for comparison.   Electronically Signed By: Ammie Ferrier M.D. On: 04/02/2016 09:20  CLINICAL DATA: Patient was called back from screening mammogram for left breast calcifications. The patient has a history of left breast cancer status post lumpectomy and radiation therapy in 2004.  EXAM: DIGITAL DIAGNOSTIC LEFT MAMMOGRAM  COMPARISON: Previous exam(s).  ACR Breast Density Category b: There are scattered areas of fibroglandular density.  FINDINGS: Magnification views of the 12 o'clock region of the left breast was performed. There are developing scattered coarse heterogeneous calcifications spanning an area of 3.7 cm in the lumpectomy site. They are indeterminate. There is no suspicious mass.  IMPRESSION: Indeterminate calcifications in the left breast.  RECOMMENDATION: Stereotactic biopsy of the left breast calcifications is recommended. The procedure has been scheduled 04/17/2016.  I have discussed the findings and recommendations with the patient. Results were also provided in writing at the conclusion of the visit. If applicable, a reminder letter will be sent to the patient regarding the next appointment.  BI-RADS CATEGORY 4: Suspicious.   Electronically Signed By: Lillia Mountain M.D. On: 04/13/2016 09:10  CLINICAL DATA: Patient presents for stereotactic core needle biopsy of a group of indeterminate microcalcifications adjacent the lumpectomy site of the inner mid portion of the left breast. Previous malignant left lumpectomy 2004 with radiation. Benign stereotactic left breast biopsy 2009 at the lumpectomy  site.  EXAM: LEFT BREAST STEREOTACTIC CORE NEEDLE BIOPSY  COMPARISON: Previous exams.  FINDINGS: The patient and I discussed the procedure of  stereotactic-guided biopsy including benefits and alternatives. We discussed the high likelihood of a successful procedure. We discussed the risks of the procedure including infection, bleeding, tissue injury, clip migration, and inadequate sampling. Informed written consent was given. The usual time out protocol was performed immediately prior to the procedure.  Using sterile technique and 1% Lidocaine as local anesthetic, under stereotactic guidance, a 9 gauge vacuum assisted biopsy device was used to perform core needle biopsy of the targeted microcalcifications along the inferior lateral aspect of the left lumpectomy site using a superior to inferior approach. Five adequate tissue specimens were obtained. Specimen radiograph was performed showing several of the targeted microcalcifications. Specimens with calcifications are identified for pathology.  At the conclusion of the procedure, a coil shaped tissue marker clip was deployed into the biopsy cavity. Follow-up 2-view mammogram was performed and dictated separately.  IMPRESSION: Stereotactic-guided biopsy of indeterminate left breast microcalcifications. No apparent complications.  Electronically Signed: By: Marin Olp M.D. On: 04/17/2016 09:22  CLINICAL DATA: History of left lumpectomy for grade 3 invasive ductal carcinoma after neoadjuvant treatment in 2004. Patient had 1/10 axillary lymph nodes positive for metastases. 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 Evaluate left axilla for adenopathy.  EXAM: ULTRASOUND LEFT UPPER EXTREMITY LIMITED  TECHNIQUE: Ultrasound examination of the upper extremity soft tissues was performed in the area of clinical  concern.  COMPARISON: 04/17/2016 and earlier  FINDINGS: I palpate no abnormality in the left axilla. There is a well-healed axillary incision. Ultrasound of the left axilla is negative, demonstrating no enlarged lymph nodes or mass.  IMPRESSION: No axillary adenopathy by ultrasound.  RECOMMENDATION: Treatment plan for known left breast cancer.  BI-RADS: 1: Negative.   Electronically Signed By: Nolon Nations M.D. 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  LABS: Creatinine was obtained on site at [facility] at [address]  Results: Creatinine 0.7 mg/dL.  EXAM: BILATERAL BREAST MRI WITH AND WITHOUT CONTRAST  TECHNIQUE: Multiplanar, multisequence MR images of both breasts were obtained prior to and following the intravenous administration of 20 ml of MultiHance.  THREE-DIMENSIONAL MR IMAGE RENDERING ON INDEPENDENT WORKSTATION:  Three-dimensional MR images were rendered by post-processing of the original MR data on an independent workstation. The three-dimensional MR images were interpreted, and findings are reported in the following complete MRI report for this study. Three dimensional images were evaluated at the independent DynaCad workstation  COMPARISON: Recent mammogram and biopsy examinations. Previous breast MR dated 12/17/2006.  FINDINGS: Breast composition: b. Scattered fibroglandular tissue.  Background parenchymal enhancement: Minimal.  Right breast: No mass or abnormal enhancement.  Left breast: 1.3 x 0.8 x 0.7 cm enhancing mass in the central portion of the breast. This is not mildly irregular and contains a biopsy marker clip artifact posteriorly and superiorly. No additional masses or areas of  abnormal enhancement. Stable post lumpectomy changes in the central portion of the breast.  Lymph nodes: Enlarged, lobulated right axillary lymph node with diffuse cortical thickening, measuring 3.2 x 2.0 x 1.8 cm in maximum dimensions. There was a much smaller normal appearing lymph node at that location in 2008. No adenopathy elsewhere.  Ancillary findings: None.  IMPRESSION: 1. 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 2. Enlarged, lobulated right inferior axillary lymph node with diffuse cortical thickening. This is a new finding since the previous MR dated 12/17/2006. This could represent a reactive lymph node or a lymph node involved with malignancy such as metastatic disease or lymphoproliferative disease.  RECOMMENDATION: Right axillary ultrasound and possible ultrasound-guided core needle biopsy of the interval enlarged, lobulated right inferior axillary lymph node with diffuse cortical thickening.  BI-RADS CATEGORY 0: Incomplete. Need additional imaging evaluation and/or prior mammograms for comparison.   Electronically Signed  By: Claudie Revering M.D.  On: 05/05/2016 13:36   Diagnosis Breast, left, needle core biopsy, inner mid breast - INVASIVE DUCTAL CARCINOMA WITH CALCIFICATIONS, SEE COMMENT. - DUCTAL CARCINOMA IN SITU WITH COMEDONECROSIS. Microscopic Comment While grading is best performed on the resection specimen the invasive carcinoma appears grade 2. Prognostic markers will be ordered and reported in an addendum. Dr. Lyndon Code has reviewed the case. The case was called to [facility] on 04/20/2016. Vicente Males MD Pathologist, Electronic Signature (Case signed 04/20/2016)  PROGNOSTIC INDICATORS Results: IMMUNOHISTOCHEMICAL AND MORPHOMETRIC ANALYSIS PERFORMED MANUALLY Estrogen Receptor: 100%, POSITIVE,  STRONG STAINING INTENSITY Progesterone Receptor: 15%, POSITIVE, STRONG STAINING INTENSITY Proliferation Marker Ki67: 10% REFERENCE RANGE ESTROGEN RECEPTOR NEGATIVE 0% POSITIVE =>1% REFERENCE RANGE PROGESTERONE RECEPTOR NEGATIVE 0% POSITIVE =>1%  Results: HER2 - *EQUIVOCAL* (Her2 by IHC will be ordered.) RATIO OF HER2/CEP17 SIGNALS 1.73 AVERAGE HER2 COPY NUMBER PER CELL 4.15 Reference Range: NEGATIVE HER2/CEP17 Ratio <2.0 and average HER2 copy number <4.0 EQUIVOCAL HER2/CEP17 Ratio <2.0 and average HER2 copy number >=4.0 and <6.0 POSITIVE HER2/CEP17 Ratio >=2.0 or <2.0 and average HER2 copy number >=6.0   Other Problems Shyrl Numbers, LPN; 2/42/6834 19:62 AM) Breast Cancer High blood pressure  Past Surgical History Shyrl Numbers, LPN; 2/29/7989 21:19 AM) Foot Surgery Bilateral.  Diagnostic Studies History Shyrl Numbers, LPN; 03/16/4080 44:81 AM) Colonoscopy 1-5 years ago Mammogram within last year  Allergies Shyrl Numbers, LPN; 8/56/3149 70:26 AM) Codeine/Codeine Derivatives  Medication History Shyrl Numbers, LPN; 3/78/5885 02:77 AM) GlyBURIDE-MetFORMIN (2.5-500MG Tablet, Oral) Active. MetFORMIN HCl (500MG Tablet, Oral) Active. Lisinopril-Hydrochlorothiazide (20-25MG Tablet, Oral) Active. Aspirin (81MG Tablet DR, Oral) Active. Ibuprofen (200MG Capsule, Oral) Active. Fish Oil (300MG Capsule, Oral) Active. Vitamin D3 (1000UNIT Capsule, Oral) Active. Medications Reconciled  Social History Shyrl Numbers, LPN; 03/11/8785 76:72 AM) No alcohol use Tobacco use Never smoker.  Family History Shyrl Numbers, LPN; 0/94/7096 28:36 AM) Hypertension Father. Malignant Neoplasm Of Pancreas Mother.  Pregnancy / Birth History Shyrl Numbers, LPN; 05/28/4764 46:50 AM) Age of menopause [age] Irregular periods Maternal age 23-25 Para 2    Review of Systems Joelene Millin F. Turpin LPN; 3/54/6568 12:75 AM) General Present-  Weight Loss. Not Present- Appetite Loss, Chills, Fatigue, Fever, Night Sweats and Weight Gain. Skin Not Present- Change in Wart/Mole, Dryness, Hives, Jaundice, New Lesions, Non-Healing Wounds, Rash and Ulcer. Respiratory Not Present- Bloody sputum, Chronic Cough, Difficulty Breathing, Snoring and Wheezing. Breast Present- Breast Mass. Not Present- Breast Pain, Nipple Discharge and Skin Changes. Cardiovascular Present- Leg Cramps. Not Present- Chest Pain, Difficulty Breathing Lying Down, Palpitations, Rapid Heart Rate, Shortness of Breath and Swelling of Extremities. Gastrointestinal Not Present- Abdominal Pain, Bloating, Bloody Stool, Change in Bowel Habits, Chronic diarrhea, Constipation, Difficulty Swallowing, Excessive gas, Gets full quickly at meals, Hemorrhoids, Indigestion, Nausea, Rectal Pain and Vomiting. Musculoskeletal Present- Joint Pain. Not Present- Back Pain, Joint Stiffness,  Muscle Pain, Muscle Weakness and Swelling of Extremities. Neurological Not Present- Decreased Memory, Fainting, Headaches, Numbness, Seizures, Tingling, Tremor, Trouble walking and Weakness. Psychiatric Not Present- Anxiety, Bipolar, Change in Sleep Pattern, Depression, Fearful and Frequent crying. Hematology Not Present- Easy Bruising, Excessive bleeding, Gland problems, HIV and Persistent Infections.   Physical Exam Rodman Key K. Cia Garretson MD; 04/24/2016 12:19 PM) The physical exam findings are as follows: Note:WDWN in NAD HEENT: EOMI, sclera anicteric Neck: No masses, no thyromegaly Breasts: Right breast shows no palpable masses, no nipple retraction or discharge, no axillary lymphadenopathy.  Left breast shows a healed axillary dissection scar with no palpable lymphadenopathy. The upper central breast shows scarring from previous lumpectomy and radiation. There is slight thickening around the scar but no palpable dominant masses. No nipple discharge. Lungs: CTA bilaterally; normal respiratory effort CV: Regular  rate and rhythm; no murmurs Abd: +bowel sounds, soft, non-tender, no masses Ext: Well-perfused; no edema Skin: Warm, dry; no sign of jaundice    Assessment & Plan Rodman Key K. Bernise Sylvain MD; 04/24/2016 12:22 PM) RECURRENT BREAST CANCER, LEFT (C50.912) Current Plans   Left mastectomy and right port placement.  She has already had a lymph node dissection.  The surgical procedure has been discussed with the patient.  Potential risks, benefits, alternative treatments, and expected outcomes have been explained.  All of the patient's questions at this time have been answered.  The likelihood of reaching the patient's treatment goal is good.  The patient understand the proposed surgical procedure and wishes to proceed.  Imogene Burn. Georgette Dover, MD, [facility]  General/ Trauma Surgery  05/19/2016 11:43 AM

## 2016-05-28 NOTE — Transfer of Care (Signed)
Immediate Anesthesia Transfer of Care Note  Patient: Lindsey Marquez  Procedure(s) Performed: Procedure(s): LEFT  MASTECTOMY (Left) INSERTION PORT-A-CATH (Right)  Patient Location: PACU  Anesthesia Type:General  Level of Consciousness: oriented, drowsy, cooperative   Airway & Oxygen Therapy: Patient Spontanous Breathing  Post-op Assessment: Report given to RN and Post -op Vital signs reviewed and stable  Post vital signs: Reviewed and stable  Last Vitals:  Filed Vitals:   05/28/16 1145 05/28/16 1150  BP:  165/66  Pulse: 92 90  Temp:    Resp: 20 19    Last Pain:  Filed Vitals:   05/28/16 1151  PainSc: 5          Complications: No apparent anesthesia complications

## 2016-05-28 NOTE — Anesthesia Postprocedure Evaluation (Signed)
Anesthesia Post Note  Patient: Lindsey Marquez  Procedure(s) Performed: Procedure(s) (LRB): LEFT  MASTECTOMY (Left) INSERTION PORT-A-CATH (Right)  Patient location during evaluation: PACU Anesthesia Type: General and Regional Level of consciousness: sedated and patient cooperative Pain management: pain level controlled Vital Signs Assessment: post-procedure vital signs reviewed and stable Respiratory status: spontaneous breathing Cardiovascular status: stable Anesthetic complications: no    Last Vitals:  Filed Vitals:   05/28/16 1559 05/28/16 2155  BP: 165/81 139/75  Pulse: 87 92  Temp: 36.8 C 36.9 C  Resp: 18 18    Last Pain:  Filed Vitals:   05/28/16 2157  PainSc: [place]

## 2016-05-29 ENCOUNTER — Encounter (HOSPITAL_COMMUNITY): Payer: Self-pay | Admitting: Surgery

## 2016-05-29 DIAGNOSIS — C50912 Malignant neoplasm of unspecified site of left female breast: Secondary | ICD-10-CM | POA: Diagnosis not present

## 2016-05-29 LAB — BASIC METABOLIC PANEL
Anion gap: 6 (ref 5–15)
BUN: 9 mg/dL (ref 6–20)
CO2: 25 mmol/L (ref 22–32)
Calcium: 8.8 mg/dL — ABNORMAL LOW (ref 8.9–10.3)
Chloride: 104 mmol/L (ref 101–111)
Creatinine, Ser: 0.61 mg/dL (ref 0.44–1.00)
GFR calc Af Amer: >60 mL/min (ref >60)
GFR calc non Af Amer: >60 mL/min (ref >60)
Glucose, Bld: 140 mg/dL — ABNORMAL HIGH (ref 65–99)
Potassium: 4 mmol/L (ref 3.5–5.1)
Sodium: 135 mmol/L (ref 135–145)

## 2016-05-29 LAB — CBC
HCT: 35.8 % — ABNORMAL LOW (ref 36.0–46.0)
Hemoglobin: 11.7 g/dL — ABNORMAL LOW (ref 12.0–15.0)
MCH: 32 pg (ref 26.0–34.0)
MCHC: 32.7 g/dL (ref 30.0–36.0)
MCV: 97.8 fL (ref 78.0–100.0)
Platelets: 331 10*3/uL (ref 150–400)
RBC: 3.66 MIL/uL — ABNORMAL LOW (ref 3.87–5.11)
RDW: 12.8 % (ref 11.5–15.5)
WBC: 10.8 10*3/uL — ABNORMAL HIGH (ref 4.0–10.5)

## 2016-05-29 NOTE — Progress Notes (Signed)
1 Day Post-Op  Subjective: Doing well no complaints  Up in chair  Objective: Vital signs in last 24 hours: Temp:  [97.7 F (36.5 C)-98.4 F (36.9 C)] 97.8 F (36.6 C) (06/30 0645) Pulse Rate:  [77-93] 77 (06/30 0645) Resp:  [14-20] 18 (06/30 0645) BP: (129-188)/(66-81) 129/69 mmHg (06/30 0645) SpO2:  [93 %-100 %] 97 % (06/30 0645) Weight:  [107.049 kg (236 lb)] 107.049 kg (236 lb) (06/29 1036) Last BM Date: 05/27/16  Intake/Output from previous day: 06/29 0701 - 06/30 0700 In: 2232.5 [P.O.:240; I.V.:1992.5] Out: 500 [Urine:250; Drains:125; Blood:125] Intake/Output this shift:    Incision/Wound:mastectomy wound CDI staples in place flaps viable no hematoma  Port site clean    Lab Results:   Recent Labs  05/28/16 1437 05/29/16 0532  WBC 8.8 10.8*  HGB 12.1 11.7*  HCT 36.9 35.8*  PLT 326 331   BMET  Recent Labs  05/26/16 1325 05/28/16 1619 05/29/16 0532  NA 137  --  135  K 3.9  --  4.0  CL 110  --  104  CO2 24  --  25  GLUCOSE 194*  --  140*  BUN 9  --  9  CREATININE 0.77 0.67 0.61  CALCIUM 8.9  --  8.8*   PT/INR No results for input(s): LABPROT, INR in the last 72 hours. ABG No results for input(s): PHART, HCO3 in the last 72 hours.  Invalid input(s): PCO2, PO2  Studies/Results: Dg Chest Port 1 View  05/28/2016  CLINICAL DATA:  Port-A-Cath placement EXAM: PORTABLE CHEST 1 VIEW COMPARISON:  10/28/2010 FINDINGS: There is a right-sided Port-A-Cath with the tip projecting over the right atrium. There is a left basilar chest tube. There is no pneumothorax. There is right basilar atelectasis. There is no pleural effusion. The heart mediastinum are stable. There is evidence of prior left axillary dissection. The osseous structures are unremarkable. IMPRESSION: 1. Right-sided Port-A-Cath with the tip projecting over the right atrium. Electronically Signed   By: Kathreen Devoid   On: 05/28/2016 14:28   Dg Fluoro Guide Cv Line-no Report  05/28/2016  CLINICAL DATA:   FLOURO GUIDE CV LINE Fluoroscopy was utilized by the requesting physician.  No radiographic interpretation.    Anti-infectives: Anti-infectives    Start     Dose/Rate Route Frequency Ordered Stop   05/28/16 2000  ceFAZolin (ANCEF) IVPB 2g/100 mL premix     2 g 200 mL/hr over 30 Minutes Intravenous Every 8 hours 05/28/16 1608 05/28/16 2106   05/28/16 1100  ceFAZolin (ANCEF) IVPB 2g/100 mL premix     2 g 200 mL/hr over 30 Minutes Intravenous To [facility] 05/27/16 0924 05/28/16 1219      Assessment/Plan: s/p Procedure(s): LEFT  MASTECTOMY (Left) INSERTION PORT-A-CATH (Right) Plan for discharge tomorrow     Cohen Boettner A. 05/29/2016

## 2016-05-30 DIAGNOSIS — C50912 Malignant neoplasm of unspecified site of left female breast: Secondary | ICD-10-CM | POA: Diagnosis not present

## 2016-05-30 MED ORDER — TRAMADOL HCL 50 MG PO TABS: 100.0000 mg | ORAL_TABLET | Freq: Four times a day (QID) | ORAL | Status: DC | PRN

## 2016-05-30 NOTE — Progress Notes (Addendum)
Discharge instructions gone over with patient and family. Home medications gone over with patient.  Prescription given. Follow up appointment is made. Diet, activity, and incisional care gone over. Signs and symptoms of infection or worsening condition gone over. Patient demonstrated emptying and measuring drain fluid. Patient verbalized understanding of instructions. Port a cath booklet and information was given to patient also.

## 2016-05-30 NOTE — Progress Notes (Signed)
Patient ID: Lindsey Marquez, female   DOB: Sep 17, 1951, 65 y.o.   MRN: [id number]  [facility]  Subjective: POD#2 - patient up in chair, family at side, ready to go home.  Pain controlled, tolerating diet.  Instructed in drain care.  Objective: Vital signs in last 24 hours: Temp:  [97.9 F (36.6 C)-98.5 F (36.9 C)] 97.9 F (36.6 C) (07/01 0616) Pulse Rate:  [66-87] 66 (07/01 0616) Resp:  [16-18] 16 (07/01 0616) BP: (99-124)/(54-63) 99/57 mmHg (07/01 0616) SpO2:  [96 %-98 %] 97 % (07/01 0616) Last BM Date: 05/27/16  Intake/Output from previous day: 06/30 0701 - 07/01 0700 In: 993.8 [P.O.:600; I.V.:393.8] Out: 1530 [Urine:1450; Drains:80] Intake/Output this shift: Total I/O In: 240 [P.O.:240] Out: -   Physical Exam: HEENT - sclerae clear, mucous membranes moist Neck - soft Chest - breast binder in place, dry; port site dressing intact Ext - no edema, non-tender Neuro - alert & oriented, no focal deficits  Lab Results:   Recent Labs  05/28/16 1437 05/29/16 0532  WBC 8.8 10.8*  HGB 12.1 11.7*  HCT 36.9 35.8*  PLT 326 331   BMET  Recent Labs  05/28/16 1619 05/29/16 0532  NA  --  135  K  --  4.0  CL  --  104  CO2  --  25  GLUCOSE  --  140*  BUN  --  9  CREATININE 0.67 0.61  CALCIUM  --  8.8*   PT/INR No results for input(s): LABPROT, INR in the last 72 hours. Comprehensive Metabolic Panel:    Component Value Date/Time   NA 135 05/29/2016 0532   NA 137 05/26/2016 1325   NA 139 05/08/2016 1235   NA 139 12/14/2013 0848   K 4.0 05/29/2016 0532   K 3.9 05/26/2016 1325   K 3.9 05/08/2016 1235   K 3.8 12/14/2013 0848   CL 104 05/29/2016 0532   CL 110 05/26/2016 1325   CL 104 12/12/2012 1031   CO2 25 05/29/2016 0532   CO2 24 05/26/2016 1325   CO2 27 05/08/2016 1235   CO2 26 12/14/2013 0848   BUN 9 05/29/2016 0532   BUN 9 05/26/2016 1325   BUN 10.6 05/08/2016 1235   BUN 14.1 12/14/2013 0848   CREATININE 0.61  05/29/2016 0532   CREATININE 0.67 05/28/2016 1619   CREATININE 0.8 05/08/2016 1235   CREATININE 0.8 12/14/2013 0848   GLUCOSE 140* 05/29/2016 0532   GLUCOSE 194* 05/26/2016 1325   GLUCOSE 92 05/08/2016 1235   GLUCOSE 153* 12/14/2013 0848   GLUCOSE 130* 12/12/2012 1031   CALCIUM 8.8* 05/29/2016 0532   CALCIUM 8.9 05/26/2016 1325   CALCIUM 9.7 05/08/2016 1235   CALCIUM 9.4 12/14/2013 0848   AST 61* 05/08/2016 1235   AST 62* 12/14/2013 0848   AST 40* 12/03/2011 0929   AST 32 04/29/2011 0901   ALT 68* 05/08/2016 1235   ALT 79* 12/14/2013 0848   ALT 50* 12/03/2011 0929   ALT 37* 04/29/2011 0901   ALKPHOS 63 05/08/2016 1235   ALKPHOS 59 12/14/2013 0848   ALKPHOS 58 12/03/2011 0929   ALKPHOS 56 04/29/2011 0901   BILITOT 0.51 05/08/2016 1235   BILITOT 0.70 12/14/2013 0848   BILITOT 0.4 12/03/2011 0929   BILITOT 0.4 04/29/2011 0901   PROT 8.3 05/08/2016 1235   PROT 7.7 12/14/2013 0848   PROT 7.4 12/03/2011 0929   PROT 7.1 04/29/2011 0901   ALBUMIN 3.9 05/08/2016 1235   ALBUMIN  3.8 12/14/2013 0848   ALBUMIN 4.4 12/03/2011 0929   ALBUMIN 4.0 04/29/2011 0901    Studies/Results: Dg Chest Port 1 View  05/28/2016  CLINICAL DATA:  Port-A-Cath placement EXAM: PORTABLE CHEST 1 VIEW COMPARISON:  10/28/2010 FINDINGS: There is a right-sided Port-A-Cath with the tip projecting over the right atrium. There is a left basilar chest tube. There is no pneumothorax. There is right basilar atelectasis. There is no pleural effusion. The heart mediastinum are stable. There is evidence of prior left axillary dissection. The osseous structures are unremarkable. IMPRESSION: 1. Right-sided Port-A-Cath with the tip projecting over the right atrium. Electronically Signed   By: Kathreen Devoid   On: 05/28/2016 14:28   Dg Fluoro Guide Cv Line-no Report  05/28/2016  CLINICAL DATA:  FLOURO GUIDE CV LINE Fluoroscopy was utilized by the requesting physician.  No radiographic interpretation.    Assessment &  Plans: Status post left mastectomy and port placement  Discharge home today  Drain care  Follow up 06/04/2016 at [place] office for drain removal  Earnstine Regal, MD, [facility] Office: [place] 05/30/2016

## 2016-06-05 ENCOUNTER — Telehealth (HOSPITAL_COMMUNITY): Payer: Self-pay | Admitting: Vascular Surgery

## 2016-06-05 NOTE — Telephone Encounter (Signed)
Pt does not have VM will ry back later

## 2016-06-08 ENCOUNTER — Telehealth (HOSPITAL_COMMUNITY): Payer: Self-pay | Admitting: Vascular Surgery

## 2016-06-08 NOTE — Telephone Encounter (Signed)
Will sent pt Letter

## 2016-06-08 NOTE — Telephone Encounter (Signed)
Left opt message to make NP breast appt

## 2016-06-11 ENCOUNTER — Encounter: Payer: Self-pay | Admitting: Hematology

## 2016-06-11 ENCOUNTER — Telehealth: Payer: Self-pay | Admitting: Hematology

## 2016-06-11 ENCOUNTER — Ambulatory Visit (HOSPITAL_BASED_OUTPATIENT_CLINIC_OR_DEPARTMENT_OTHER): Payer: Medicare PPO | Admitting: Hematology

## 2016-06-11 VITALS — BP 134/106 | HR 107 | Temp 100.0°F | Resp 20 | Ht 69.0 in | Wt 235.0 lb

## 2016-06-11 DIAGNOSIS — E119 Type 2 diabetes mellitus without complications: Secondary | ICD-10-CM

## 2016-06-11 DIAGNOSIS — Z170 Estrogen receptor positive status [ER+]: Secondary | ICD-10-CM

## 2016-06-11 DIAGNOSIS — C50112 Malignant neoplasm of central portion of left female breast: Secondary | ICD-10-CM | POA: Diagnosis not present

## 2016-06-11 DIAGNOSIS — Z853 Personal history of malignant neoplasm of breast: Secondary | ICD-10-CM

## 2016-06-11 DIAGNOSIS — I10 Essential (primary) hypertension: Secondary | ICD-10-CM | POA: Diagnosis not present

## 2016-06-11 NOTE — Telephone Encounter (Signed)
Gave  Patient avs report and appointments for July. No orders for imaging studies per 7/13 pof. Message to YF to enter orders.

## 2016-06-11 NOTE — Progress Notes (Signed)
[facility] [place] [facility]  [phone] [phone]  Clinic Follow Up Note   Patient Care Team: Lindsey Fenton, NP as PCP - General (Internal Medicine) Lindsey Mesa, MD as Consulting Physician (General Surgery) 06/11/2016   CHIEF COMPLAINTS:  Follow up recurrent left breast cancer   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 Lindsey Marquez)   Staging form: Breast, AJCC 7th Edition     Clinical stage from 04/17/2016: Stage IA (T1c, N0, M0) - Signed by Lindsey Merle, MD on 05/07/2016     Pathologic stage from 05/28/2016: Stage IIA (T2, N0, cM0) - Signed by Lindsey Merle, MD on 06/11/2016 History of left breast cancer   Staging form: Breast, AJCC 7th Edition     Clinical: Stage IIIB (T4d, N1, cM0) - Signed by Lindsey Lark, MD on 12/14/2013     Pathologic: Stage IIIB (T4d, N1a, cM0) - Signed by Lindsey Lark, MD on 12/14/2013       History of left breast cancer   09/12/2002 Imaging CT scan show no evidence of disease apart from the left axilla   09/2002 -  Neo-Adjuvant Chemotherapy TAC X4 cycles    01/10/2003 Surgery The patient had left lumpectomy and axillary lymph node dissection which show residual breast cancer and a sentinel lymph node was positive   2004 -  Adjuvant Chemotherapy Cytoxan with 5-FU x4 cycles (methotrexate added at cycle 4).   2004 -  Radiation Therapy Adjuvant left breast radiation   12/2002 Receptors her2 ER, PR and HER-2 were all negative.    Cancer of central portion of left breast (Lindsey Marquez)   04/13/2016 Mammogram Scheduler coarse heterogeneous calcifications spanning an area of 3.7 cm in the lumpectomy site, indeterminate. Ultrasound of the left axilla was negative.   04/17/2016 Initial Diagnosis Cancer of central portion of left breast (Lindsey Marquez)   04/17/2016 Initial Biopsy Left breast core needle biopsy showed invasive and in situ ductal carcinoma with calcification, grade 2.   04/17/2016 Receptors her2 Your 100% positive, PR 15% positive, strong staining, Ki-67  10%, HER-2 positive by IHC (3)   05/05/2016 Imaging Bilateral breast MRI showed a 1.3 x 0.8 x 0.7 cm biopsy-proven ductal carcinoma in the region of the previous lumpectomy. Enlarged lobulated right inferior axillary lymph node with diffuse cortical thickening, biopsy is recommended.   05/15/2016 Pathology Results right axilary node biopsy was negative for malignant cell    05/28/2016 Surgery Simple left mastectomy   05/28/2016 Pathology Results Left mastectomy showed invasive ductal carcinoma, grade 3, 3.6 cm, high grade DCIS, (+) LVI, surgical margins were negative. Tumor 3.6 cm, no lymph nodes identified.      HISTORY OF PRESENTING ILLNESS:  Lindsey Marquez 65 y.o. female is here because of her recurrent left breast cancer.  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 She was seen by multiple medical oncologist in our practice in the past, last was seen by Dr. Alvy Marquez in 11/2013.  She has been doing well, and is compliant with annual screening mammogram. The screening mammogram on 04/17/2069 showed calcification in the left breast, and core needle biopsy showed invasive and in situ ductal carcinoma, grade 2, ER/PR positive, HER-2 amplified. She was referred to seeing breast surgeon Dr. Georgette Marquez and referred back to [place].   She feels well, she did not feel any lump in her breast or axillas latley. She denies any pain, dyspnea, or GI  symptoms. She had right nipple rash and right axillary swelling in the past one week, after she drinks some diet tea with citric. Breast MRI showed an enlarged lobulated right inferior axillary lymph node with diffuse cortical thickening, in addition to the known left breast mass which measured 1.3 cm on MRI.  GYN HISTORY  Menarchal: 12 LMP: 2003 Contraceptive: 20 HRT: n/a  G2P2: 34 yo Son and 17 yo son who died   CURRENT THERAPY: pending  adjuvant chemotherapy  INTERIM HISTORY: Lindsey Marquez returns for follow-up. She underwent left mastectomy on June 29. She tolerated surgery well, still has the drainage tube in place, pain is improved. She otherwise feels well, has good appetite and energy level. No left arm swelling. She is accompanied by her daughter to the clinic today, and is here to discuss adjuvant therapy.  MEDICAL HISTORY:  Past Medical History  Diagnosis Date  . Breast cancer [facility]) August 2002    Invasive ductal carcinoma Left breast.  . Family history of malignant neoplasm of ovary   . Family history of pancreatic cancer   . Diabetes mellitus without complication ([place])   . Hypertension   . Anxiety     SURGICAL HISTORY: Past Surgical History  Procedure Laterality Date  . Foot surgery  2000  . Tubal ligation  22 yrs since  2004.    Bilateral.  . Breast surgery Left 2003  . Mastectomy Left 05/28/2016  . Port a cath insertion  05/28/2016  . Total mastectomy Left 05/28/2016    Procedure: LEFT  MASTECTOMY;  Surgeon: Lindsey Mesa, MD;  Location: [place];  Service: General;  Laterality: Left;  . Portacath placement Right 05/28/2016    Procedure: INSERTION PORT-A-CATH;  Surgeon: Lindsey Mesa, MD;  Location: [place];  Service: General;  Laterality: Right;    SOCIAL HISTORY: Social History   Social History  . Marital Status: Divorced    Spouse Name: N/A  . Number of Children: N/A  . Years of Education: N/A   Occupational History  . Not on file.   Social History Main Topics  . Smoking status: Former Smoker -- 0.10 packs/day for 7 years    Quit date: 12/01/1975  . Smokeless tobacco: Never Used  . Alcohol Use: No  . Drug Use: No  . Sexual Activity: Yes   Other Topics Concern  . Not on file   Social History Narrative   She is retired from school   FAMILY HISTORY: Family History  Problem Relation Age of Onset  . Prostate cancer Father   . Ovarian cancer Maternal Aunt 43    deceased  . Cancer Maternal  Uncle     "bone ca"; unk. primary; deceased 48s  . Pancreatic cancer Mother 27    deceased  . Cancer Maternal Aunt     2 other mat aunts; unk. primary in 46s; deceased  . Prostate cancer Maternal Uncle     deceased 76    ALLERGIES:  is allergic to codeine.  MEDICATIONS:  Current Outpatient Prescriptions  Medication Sig Dispense Refill  . aspirin 81 MG tablet Take 81 mg by mouth daily.     Marland Kitchen glyBURIDE-metformin (GLUCOVANCE) 2.5-500 MG per tablet Take 1 tablet by mouth 2 (two) times daily with a meal.      . ibuprofen (ADVIL,MOTRIN) 200 MG tablet Take 200 mg by mouth every 8 (eight) hours as needed.      Marland Kitchen lisinopril-hydrochlorothiazide (PRINZIDE,ZESTORETIC) 20-25 MG per tablet Take 1 tablet by mouth daily.      Marland Kitchen  Omega-3 Fatty Acids (FISH OIL) 1200 MG CAPS Take 1 capsule by mouth daily.    . Potassium 99 MG TABS Take 1 tablet by mouth daily.    . traMADol (ULTRAM) 50 MG tablet Take 2 tablets (100 mg total) by mouth every 6 (six) hours as needed for moderate pain. 20 tablet 0   No current facility-administered medications for this visit.    REVIEW OF SYSTEMS:   Constitutional: Denies fevers, chills or abnormal night sweats Eyes: Denies blurriness of vision, double vision or watery eyes Ears, nose, mouth, throat, and face: Denies mucositis or sore throat Respiratory: Denies cough, dyspnea or wheezes Cardiovascular: Denies palpitation, chest discomfort or lower extremity swelling Gastrointestinal:  Denies nausea, heartburn or change in bowel habits Skin: Denies abnormal skin rashes Lymphatics: Denies new lymphadenopathy or easy bruising Neurological:Denies numbness, tingling or new weaknesses Behavioral/Psych: Mood is stable, no new changes  All other systems were reviewed with the patient and are negative.  PHYSICAL EXAMINATION: ECOG PERFORMANCE STATUS: 1  Filed Vitals:   06/11/16 0902  BP: 134/106  Pulse: 107  Temp: 100 F (37.8 C)  Resp: 20   Filed Weights   06/11/16  0902  Weight: 235 lb (106.595 kg)    GENERAL:alert, no distress and comfortable SKIN: skin color, texture, turgor are normal, no rashes or significant lesions EYES: normal, conjunctiva are pink and non-injected, sclera clear OROPHARYNX:no exudate, no erythema and lips, buccal mucosa, and tongue normal  NECK: supple, thyroid normal size, non-tender, without nodularity LYMPH:  no palpable lymphadenopathy in the cervical, axillary or inguinal LUNGS: clear to auscultation and percussion with normal breathing effort HEART: regular rate & rhythm and no murmurs and no lower extremity edema ABDOMEN:abdomen soft, non-tender and normal bowel sounds Musculoskeletal:no cyanosis of digits and no clubbing  PSYCH: alert & oriented x 3 with fluent speech NEURO: no focal motor/sensory deficits Breasts: Breast inspection showed left breast is surgically absent, sutures in place, since a drainage tube in place. Incision site appears clean and dry, no discharge. Exam of the right breast and bilateral axillas reviewed no palpable mass or adenopathy.   LABORATORY DATA:  I have reviewed the data as listed CBC Latest Ref Rng 05/29/2016 05/28/2016 05/26/2016  WBC 4.0 - 10.5 K/uL 10.8(H) 8.8 8.0  Hemoglobin 12.0 - 15.0 g/dL 11.7(L) 12.1 11.9(L)  Hematocrit 36.0 - 46.0 % 35.8(L) 36.9 35.9(L)  Platelets 150 - 400 K/uL 331 326 328   CMP Latest Ref Rng 05/29/2016 05/28/2016 05/26/2016  Glucose 65 - 99 mg/dL 140(H) - 194(H)  BUN 6 - 20 mg/dL 9 - 9  Creatinine 0.44 - 1.00 mg/dL 0.61 0.67 0.77  Sodium 135 - 145 mmol/L 135 - 137  Potassium 3.5 - 5.1 mmol/L 4.0 - 3.9  Chloride 101 - 111 mmol/L 104 - 110  CO2 22 - 32 mmol/L 25 - 24  Calcium 8.9 - 10.3 mg/dL 8.8(L) - 8.9  Total Protein 6.4 - 8.3 g/dL - - -  Total Bilirubin 0.20 - 1.20 mg/dL - - -  Alkaline Phos 40 - 150 U/L - - -  AST 5 - 34 U/L - - -  ALT 0 - 55 U/L - - -    PATHOLOGY REPORT  Diagnosis 04/17/2016 Breast, left, needle core biopsy, inner mid  breast - INVASIVE DUCTAL CARCINOMA WITH CALCIFICATIONS, SEE COMMENT. - DUCTAL CARCINOMA IN SITU WITH COMEDONECROSIS. Microscopic Comment While grading is best performed on the resection specimen the invasive carcinoma appears grade 2. Prognostic markers will be ordered and  reported in an addendum. Dr. Lyndon Code has reviewed the case. The case was called to [facility] on 04/20/2016.  Results: HER2 - *EQUIVOCAL* (Her2 by IHC will be ordered.) RATIO OF HER2/CEP17 SIGNALS 1.73 AVERAGE HER2 COPY NUMBER PER CELL 4.15 By immunohistochemistry, the tumor cells are positive for Her2 (3).  Results: IMMUNOHISTOCHEMICAL AND MORPHOMETRIC ANALYSIS PERFORMED MANUALLY Estrogen Receptor: 100%, POSITIVE, STRONG STAINING INTENSITY Progesterone Receptor: 15%, POSITIVE, STRONG STAINING INTENSITY Proliferation Marker Ki67: 10%  Diagnosis 05/15/2016 Lymph node, needle/core biopsy, right axilla - BENIGN REACTIVE LYMPH NODE. - NO GRANULOMAS OR MALIGNANCY IDENTIFIED. - SEE COMMENT. Microscopic Comment Internal departmental review obtained (Dr. Lyndon Code) with agreement. Results are phoned to [place] (05/18/16). (MEG:gt, 05/18/16)   Breast, simple mastectomy, Left 05/28/2016 - INVASIVE DUCTAL CARCINOMA, GRADE III/III, SPANNING 3.6 CM. - DUCTAL CARCINOMA IN SITU, HIGH GRADE. - LYMPHOVASCULAR INVASION IS IDENTIFIED. - THE SURGICAL RESECTION MARGINS ARE NEGATIVE FOR CARCINOMA. - SEE ONCOLOGY TABLE BELOW. Microscopic Comment BREAST, INVASIVE TUMOR, WITHOUT LYMPH NODES PRESENT Specimen, including laterality : Left breast Procedure: Simple mastectomy Histologic type: Ductal with micropapillary features Grade: III Tubule formation: 3 Nuclear pleomorphism: 2 Mitotic: 3 Tumor size (gross measurement): 3.6 cm Margins: Greater than 0.2 cm to all margins Lymphovascular invasion: Present Ductal carcinoma in situ: Present Grade: High grade Extensive intraductal component:  Yes Lobular neoplasia: Not identified Tumor focality: Unifocal Treatment effect: N/A Extent of tumor: Confined to breast parenchyma. Breast prognostic profile: [phone] Estrogen receptor: 100%, strong staining intensity Progesterone receptor: 15%, strong staining intensity Her 2 neu: Amplification was detected by immunohistochemistry. Ki-67: 10% 1 of 2 FINAL for ANADELIA, KINTZ ([id number]) Microscopic Comment(continued) Non-neoplastic breast: No significant findings. TNM: pT2, pNX (clinically recurrent). Comments: In addition to the main tumor, there is ductal carcinoma in situ present in random tissue submitted from the inferior lateral quadrant. (JBK:kh 06-01-16)   RADIOGRAPHIC STUDIES: I have personally reviewed the radiological images as listed and agreed with the findings in the report. [place] Extrem Up Right Ltd  05/15/2016  CLINICAL DATA:  Recently diagnosed left breast invasive ductal carcinoma and DCIS at previous lumpectomy site for [facility] in 2004 which was also treated with radiation therapy. Patient is being scheduled for left mastectomy. Breast MRI showing enlarged lymph node in the RIGHT axilla for which second look targeted ultrasound was recommended with possible ultrasound-guided biopsy. EXAM: ULTRASOUND RIGHT UPPER EXTREMITY LIMITED TECHNIQUE: Ultrasound examination of the upper extremity soft tissues was performed in the area of clinical concern. COMPARISON:  Breast MRI dated 05/05/2016. FINDINGS: There is a morphologically abnormal lymph node identified within the lower right axilla, with cortical thickness measuring up to 5 mm, corresponding to the enlarged lymph node seen on breast MRI. IMPRESSION: Morphologically abnormal lymph node within the lower right axilla, with cortical thickness measuring up to 5 mm, corresponding to the MRI finding. Ultrasound-guided biopsy is recommended to exclude malignancy. Electronically Signed   By: Franki Cabot M.D.   On: 05/15/2016  10:04   Dg Chest Port 1 View  05/28/2016  CLINICAL DATA:  Port-A-Cath placement EXAM: PORTABLE CHEST 1 VIEW COMPARISON:  10/28/2010 FINDINGS: There is a right-sided Port-A-Cath with the tip projecting over the right atrium. There is a left basilar chest tube. There is no pneumothorax. There is right basilar atelectasis. There is no pleural effusion. The heart mediastinum are stable. There is evidence of prior left axillary dissection. The osseous structures are unremarkable. IMPRESSION: 1. Right-sided Port-A-Cath with the tip projecting over the right  atrium. Electronically Signed   By: Kathreen Devoid   On: 05/28/2016 14:28   Dg Fluoro Guide Cv Line-no Report  05/28/2016  CLINICAL DATA:  FLOURO GUIDE CV LINE Fluoroscopy was utilized by the requesting physician.  No radiographic interpretation.   [place] Rt Breast Bx W Loc Dev 1st Lesion Img Bx Spec US Guide  05/18/2016  ADDENDUM REPORT: 05/18/2016 12:33 ADDENDUM: Pathology revealed a benign reactive right axillary lymph node. This was found to be concordant by Dr. Franki Cabot. Pathology results were discussed with the patient by telephone. The patient reported doing well after the biopsy. Post biopsy instructions and care were reviewed and questions were answered. The patient was encouraged to call [facility] for any additional concerns. She has recently been diagnosed with left breast cancer and is instructed to follow her outlined treatment plan. Pathology results reported by Susa Raring RN, BSN on 05/18/2016. Electronically Signed   By: Franki Cabot M.D.   On: 05/18/2016 12:33  05/18/2016  CLINICAL DATA:  Recently diagnosed invasive ductal carcinoma and DCIS of the central left breast in the region of a previous left lumpectomy for [facility] in 2004 which was also treated with radiation therapy. Patient is being scheduled for mastectomy. Breast MRI showed an enlarged lymph node within the right axilla for which second look targeted  ultrasound was recommended. Second look targeted ultrasound showed a corresponding morphologically abnormal lymph node in the lower right axilla for which ultrasound-guided biopsy was recommended. EXAM: ULTRASOUND GUIDED CORE NEEDLE BIOPSY OF A RIGHT AXILLARY NODE COMPARISON:  Previous exam(s). FINDINGS: I met with the patient and we discussed the procedure of ultrasound-guided biopsy, including benefits and alternatives. We discussed the high likelihood of a successful procedure. We discussed the risks of the procedure, including infection, bleeding, tissue injury, clip migration, and inadequate sampling. Informed written consent was given. The usual time-out protocol was performed immediately prior to the procedure. Using sterile technique and 1% Lidocaine as local anesthetic, under direct ultrasound visualization, a 14 gauge spring-loaded device was used to perform biopsy of the morphologically abnormal lymph node in the right axilla using a lateral approach. At the conclusion of the procedure a spiral shaped tissue marker clip was deployed into the biopsy cavity. Follow up 2 view mammogram was performed and dictated separately. IMPRESSION: Ultrasound guided biopsy of a morphologically abnormal right axillary lymph node. No apparent complications. Electronically Signed: By: Franki Cabot M.D. On: 05/15/2016 10:11    ASSESSMENT & PLAN:  65 year old African-American female, postmenopausal, history of left breast triple negative cancer in 2003, presented with mammogram discovered left breast cancer, triple positive.  1. Cancer of central portion of left breast, pT2NxMx, stage IIA, G3, triple positive -I reviewed her surgical pathology findings in great details with patient and her family member. It showed a 3.6 cm mass, grade 3, surgical margins were negative. She had no sentinel lymph node biopsy due to her prior history of axillary lymph node dissection. -We discussed the risk of cancer recurrence of this  complete surgical resection. We reviewed that HER-2 positive with cancers are more aggressive, with increased risk of metastasis.  -Given the stage II disease and HER-2 positivity, I recommend her to have a CT chest, abdomen and pelvis with contrast and a bone scan to ruled out distant metastasis -Given the moderate to high risk of cancer recurrence, I recommend adjuvant chemotherapy to reduce her risk of cancer recurrence. I recommend chemotherapy and due and HER-2 agents TCHP (docetaxel, carbotaxol,  Herceptin, pejeta), every 3 weeks for total of 6 cycles, followed by maintenance Herceptin every 3 weeks to complete 1 year treatment -Chemotherapy consent: Side effects including but does not not limited to, fatigue, nausea, vomiting, diarrhea, hair loss, neuropathy, fluid retention, renal and kidney dysfunction, neutropenic fever, needed for blood transfusion, bleeding, cardiomyopathy, were discussed with patient in great detail. She agrees to proceed. -Her baseline echo was normal. I'll refer her to see a cardiologist to monitor her heart during her anti-her2 therapy. --Given her strong ER/PR positivity, I would also recommend adjuvant aromatase inhibitor for 5-10 years, starting after her chemo.   2. DM and HTN  -She'll continue follow-up with her primary care physician  Plan -CT chest, abdomen and pelvis with contrast and a bone scan in the next few weeks -Chemotherapy class -Cardiology referral -I will see her back on 7/24 to reviewed the scan results and schedule her chemo TCHP   All questions were answered. The patient knows to call the clinic with any problems, questions or concerns. I spent 30 minutes counseling the patient face to face. The total time spent in the appointment was 35 minutes and more than 50% was on counseling.     Lindsey Merle, MD 06/11/2016 7:12 PM

## 2016-06-17 ENCOUNTER — Telehealth: Payer: Self-pay | Admitting: *Deleted

## 2016-06-17 ENCOUNTER — Telehealth: Payer: Self-pay

## 2016-06-17 NOTE — Telephone Encounter (Signed)
Call pt:  She does not need to go to UC. She can be scheduled here tomorrow.

## 2016-06-17 NOTE — Telephone Encounter (Signed)
Pt reports she has noticed an improvement in swelling but advised pt to schedule appt to be seen for evaluation---pt has appt tomorrow with Rollene Fare at 10:30am

## 2016-06-17 NOTE — Telephone Encounter (Signed)
  Oncology Nurse Navigator Documentation  Navigator Location: [facility] (06/17/16 1500) Navigator Encounter Type: Telephone (06/17/16 1500) Telephone: Lahoma Crocker Call;Appt Confirmation/Clarification (06/17/16 1500)     Surgery Date: 05/28/16 (06/17/16 1500)                                  Time Spent with Patient: 15 (06/17/16 1500)

## 2016-06-17 NOTE — Telephone Encounter (Signed)
PLEASE NOTE: All timestamps contained within this report are represented as [place] Standard Time. CONFIDENTIALTY NOTICE: This fax transmission is intended only for the addressee. It contains information that is legally privileged, confidential or otherwise protected from use or disclosure. If you are not the intended recipient, you are strictly prohibited from reviewing, disclosing, copying using or disseminating any of this information or taking any action in reliance on or regarding this information. If you have received this fax in error, please notify us immediately by telephone so that we can arrange for its return to [place]. Phone: [phone], Toll-Free: [phone], Fax: [phone] Page: 1 of 2 Call Id: [id number] [place] Patient Name: Lindsey Marquez Gender: Female DOB: March 23, 1951 Age: 65 Y 20 D Return Phone Number: [phone] (Primary) Address: City/State/Zip: [place] Client [place] Night - Client Client Site [place] Physician Webb Silversmith - NP Contact Type Call Who Is Calling Patient / Member / Family / Caregiver Call Type Triage / Clinical Caller Name Yavonne Relationship To Patient Self Return Phone Number [phone] (Primary) Chief Complaint Feet swelling Reason for Call Symptomatic / Request for [place] states had breast surgery about two weeks ago and now feet are swollen PreDisposition Home Care Translation No Nurse Assessment Nurse: Venetia Maxon, RN, Manuela Schwartz Date/Time (Eastern Time): 06/16/2016 4:56:31 PM Confirm and document reason for call. If symptomatic, describe symptoms. You must click the next button to save text entered. ---Caller states had breast surgery about two weeks ago and now feet are swollen she had both breasts removed for cancer . No calf pain. Feet swollen since  yesterday She was out more yesterday . and noticed . Has the patient traveled out of the country within the last 30 days? ---No Does the patient have any new or worsening symptoms? ---Yes Will a triage be completed? ---Yes Related visit to physician within the last 2 weeks? ---No Does the PT have any chronic conditions? (i.e. diabetes, asthma, etc.) ---Yes List chronic conditions. ---NIDDM CBS 89-100 HTN Is this a behavioral health or substance abuse call? ---No Guidelines Guideline Title Affirmed Question Affirmed Notes Nurse Date/Time Eilene Ghazi Time) Leg Swelling and Edema [1] Thigh, calf, or ankle swelling AND [2] bilateral AND [3] 1 side is more swollen Venetia Maxon, RN, Manuela Schwartz 06/16/2016 4:59:17 PM Disp. Time Eilene Ghazi Time) Disposition Final User PLEASE NOTE: All timestamps contained within this report are represented as [place] Standard Time. CONFIDENTIALTY NOTICE: This fax transmission is intended only for the addressee. It contains information that is legally privileged, confidential or otherwise protected from use or disclosure. If you are not the intended recipient, you are strictly prohibited from reviewing, disclosing, copying using or disseminating any of this information or taking any action in reliance on or regarding this information. If you have received this fax in error, please notify us immediately by telephone so that we can arrange for its return to [place]. Phone: [phone], Toll-Free: [phone], Fax: [phone] Page: 2 of 2 Call Id: [id number] 06/16/2016 5:02:05 PM See Physician within 4 Hours (or PCP triage) Yes Venetia Maxon, RN, Edwena Bunde Understands: Yes Disagree/Comply: Comply Care Advice Given Per Guideline SEE PHYSICIAN WITHIN 4 HOURS (or PCP triage): CALL EMS IF: Chest pain or shortness of breath occurs. CARE ADVICE given per Leg Swelling and Edema (Adult) guideline. Referrals [facility] [place]

## 2016-06-18 ENCOUNTER — Encounter: Payer: Self-pay | Admitting: Internal Medicine

## 2016-06-18 ENCOUNTER — Ambulatory Visit (INDEPENDENT_AMBULATORY_CARE_PROVIDER_SITE_OTHER): Payer: Medicare PPO | Admitting: Internal Medicine

## 2016-06-18 ENCOUNTER — Telehealth: Payer: Self-pay | Admitting: *Deleted

## 2016-06-18 ENCOUNTER — Telehealth (HOSPITAL_COMMUNITY): Payer: Self-pay | Admitting: Vascular Surgery

## 2016-06-18 VITALS — BP 130/76 | HR 78 | Temp 98.8°F | Wt 235.0 lb

## 2016-06-18 DIAGNOSIS — R600 Localized edema: Secondary | ICD-10-CM

## 2016-06-18 DIAGNOSIS — R609 Edema, unspecified: Secondary | ICD-10-CM

## 2016-06-18 DIAGNOSIS — I519 Heart disease, unspecified: Secondary | ICD-10-CM | POA: Diagnosis not present

## 2016-06-18 DIAGNOSIS — I5189 Other ill-defined heart diseases: Secondary | ICD-10-CM

## 2016-06-18 MED ORDER — FUROSEMIDE 20 MG PO TABS: 10.0000 mg | ORAL_TABLET | Freq: Every day | ORAL | Status: DC | PRN

## 2016-06-18 NOTE — Patient Instructions (Signed)
Edema °Edema is an abnormal buildup of fluids in your body tissues. Edema is somewhat dependent on gravity to pull the fluid to the lowest place in your body. That makes the condition more common in the legs and thighs (lower extremities). Painless swelling of the feet and ankles is common and becomes more likely as you get older. It is also common in looser tissues, like around your eyes.  °When the affected area is squeezed, the fluid may move out of that spot and leave a dent for a few moments. This dent is called pitting.  °CAUSES  °There are many possible causes of edema. Eating too much salt and being on your feet or sitting for a long time can cause edema in your legs and ankles. Hot weather may make edema worse. Common medical causes of edema include: °· Heart failure. °· Liver disease. °· Kidney disease. °· Weak blood vessels in your legs. °· Cancer. °· An injury. °· Pregnancy. °· Some medications. °· Obesity.  °SYMPTOMS  °Edema is usually painless. Your skin may look swollen or shiny.  °DIAGNOSIS  °Your health care provider may be able to diagnose edema by asking about your medical history and doing a physical exam. You may need to have tests such as X-rays, an electrocardiogram, or blood tests to check for medical conditions that may cause edema.  °TREATMENT  °Edema treatment depends on the cause. If you have heart, liver, or kidney disease, you need the treatment appropriate for these conditions. General treatment may include: °· Elevation of the affected body part above the level of your heart. °· Compression of the affected body part. Pressure from elastic bandages or support stockings squeezes the tissues and forces fluid back into the blood vessels. This keeps fluid from entering the tissues. °· Restriction of fluid and salt intake. °· Use of a water pill (diuretic). These medications are appropriate only for some types of edema. They pull fluid out of your body and make you urinate more often. This  gets rid of fluid and reduces swelling, but diuretics can have side effects. Only use diuretics as directed by your health care provider. °HOME CARE INSTRUCTIONS  °· Keep the affected body part above the level of your heart when you are lying down.   °· Do not sit still or stand for prolonged periods.   °· Do not put anything directly under your knees when lying down. °· Do not wear constricting clothing or garters on your upper legs.   °· Exercise your legs to work the fluid back into your blood vessels. This may help the swelling go down.   °· Wear elastic bandages or support stockings to reduce ankle swelling as directed by your health care provider.   °· Eat a low-salt diet to reduce fluid if your health care provider recommends it.   °· Only take medicines as directed by your health care provider.  °SEEK MEDICAL CARE IF:  °· Your edema is not responding to treatment. °· You have heart, liver, or kidney disease and notice symptoms of edema. °· You have edema in your legs that does not improve after elevating them.   °· You have sudden and unexplained weight gain. °SEEK IMMEDIATE MEDICAL CARE IF:  °· You develop shortness of breath or chest pain.   °· You cannot breathe when you lie down. °· You develop pain, redness, or warmth in the swollen areas.   °· You have heart, liver, or kidney disease and suddenly get edema. °· You have a fever and your symptoms suddenly get worse. °MAKE SURE YOU:  °·   Understand these instructions. °· Will watch your condition. °· Will get help right away if you are not doing well or get worse. °  °This information is not intended to replace advice given to you by your health care provider. Make sure you discuss any questions you have with your health care provider. °  °Document Released: 11/16/2005 Document Revised: 12/07/2014 Document Reviewed: 09/08/2013 °Elsevier Interactive Patient Education ©2016 [employer] ° °

## 2016-06-18 NOTE — Telephone Encounter (Signed)
Received call back from patient. Confirmed new appointment for 06/26/16 at 11am for labs and 1130am for Dr. Burr Medico.  She will keep her appointment on 7/24 for chemo education.

## 2016-06-18 NOTE — Telephone Encounter (Signed)
Left message to reschedule patient's appt.

## 2016-06-18 NOTE — Progress Notes (Addendum)
Subjective:    Patient ID: Lindsey Marquez, female    DOB: 27-Jul-1951, 65 y.o.   MRN: [id number]  HPI  Pt presents to the clinic today with a complaint of foot swelling x 4 days.  She had a bilateral mastectomy 2 weeks ago, and reports soreness across the top of her foot the day after surgery that resolved.  She wore compression stockings while hospitalized following the surgery.  She was seen by her surgeon 7 days ago for follow-up and the drains from surgery were removed at that time.  She reports the swelling began 4 days ago after a trip to the grocery store.  She has been keeping her legs elevated, and the swelling has improved slightly but perists.  She denies pain in the lower extremities, numbness or tingling, decreased range of motion, or shortness of breath.     Review of Systems   Past Medical History  Diagnosis Date  . Breast cancer [facility]) August 2002    Invasive ductal carcinoma Left breast.  . Family history of malignant neoplasm of ovary   . Family history of pancreatic cancer   . Diabetes mellitus without complication ([place])   . Hypertension   . Anxiety     Current Outpatient Prescriptions  Medication Sig Dispense Refill  . aspirin 81 MG tablet Take 81 mg by mouth daily.     Marland Kitchen glyBURIDE-metformin (GLUCOVANCE) 2.5-500 MG per tablet Take 1 tablet by mouth 2 (two) times daily with a meal.      . ibuprofen (ADVIL,MOTRIN) 200 MG tablet Take 200 mg by mouth every 8 (eight) hours as needed.      Marland Kitchen lisinopril-hydrochlorothiazide (PRINZIDE,ZESTORETIC) 20-25 MG per tablet Take 1 tablet by mouth daily.      . Omega-3 Fatty Acids (FISH OIL) 1200 MG CAPS Take 1 capsule by mouth daily.    . Potassium 99 MG TABS Take 1 tablet by mouth daily.    . traMADol (ULTRAM) 50 MG tablet Take 2 tablets (100 mg total) by mouth every 6 (six) hours as needed for moderate pain. 20 tablet 0  . furosemide (LASIX) 20 MG tablet Take 0.5 tablets (10 mg total) by mouth daily as needed. 30 tablet 1    No current facility-administered medications for this visit.    Allergies  Allergen Reactions  . Codeine     Family History  Problem Relation Age of Onset  . Prostate cancer Father   . Ovarian cancer Maternal Aunt 75    deceased  . Cancer Maternal Uncle     "bone ca"; unk. primary; deceased 85s  . Pancreatic cancer Mother 3    deceased  . Cancer Maternal Aunt     2 other mat aunts; unk. primary in 66s; deceased  . Prostate cancer Maternal Uncle     deceased 64    Social History   Social History  . Marital Status: Divorced    Spouse Name: N/A  . Number of Children: N/A  . Years of Education: N/A   Occupational History  . Not on file.   Social History Main Topics  . Smoking status: Former Smoker -- 0.10 packs/day for 7 years    Quit date: 12/01/1975  . Smokeless tobacco: Never Used  . Alcohol Use: No  . Drug Use: No  . Sexual Activity: Yes   Other Topics Concern  . Not on file   Social History Narrative     Pulm: Denies shortness of breath. MSK: Admits to swelling in  both feet.  Denies pain or decreased range of motion. Neuro: Denies numbness or tingling.  No other specific complaints in a complete review of systems (except as listed in HPI above).      Objective:   Physical Exam BP 130/76 mmHg  Pulse 78  Temp(Src) 98.8 F (37.1 C) (Oral)  Wt 235 lb (106.595 kg)  SpO2 98%  Gen: Appears stated age, in no acute distress. Pulm: Clear to auscultation bilaterally.  No wheezes, rales, or rhonchi. CV: Regular rate and rhythm.  No murmurs, rubs, or gallops. Ext: Bilateral peripheral edema of anterior tibia and feet with left worse than right.  Nontender to palpation.  Peripheral pulses 2+.  Full AROM and 5/5 strength in ankles bilaterally. Neuro: Sensation to light touch intact.       Assessment & Plan:   Bilateral peripheral edema:  Echo was reviewed, grade 1 diastolic dysfunction with EF > 55% Lasix 10 mg daily prn for swelling Continue  Lisinopril HCT Encouraged low salt diet and elevation  Call if symptoms persist or worsen  BAITY, REGINA, NP

## 2016-06-18 NOTE — Telephone Encounter (Signed)
Left message to make pt np app/ w. echo

## 2016-06-18 NOTE — Progress Notes (Signed)
Pre visit review using our clinic review tool, if applicable. No additional management support is needed unless otherwise documented below in the visit note. 

## 2016-06-22 ENCOUNTER — Ambulatory Visit: Payer: Medicare PPO | Admitting: Hematology

## 2016-06-22 ENCOUNTER — Other Ambulatory Visit: Payer: Medicare PPO

## 2016-06-25 ENCOUNTER — Ambulatory Visit (HOSPITAL_COMMUNITY)
Admission: RE | Admit: 2016-06-25 | Discharge: 2016-06-25 | Disposition: A | Discharge status: Home / Self Care | Payer: Medicare PPO | Source: Ambulatory Visit | Attending: Hematology | Admitting: Hematology

## 2016-06-25 ENCOUNTER — Encounter (HOSPITAL_COMMUNITY)
Admission: RE | Admit: 2016-06-25 | Discharge: 2016-06-25 | Disposition: A | Discharge status: Home / Self Care | Payer: Medicare PPO | Source: Ambulatory Visit | Attending: Hematology | Admitting: Hematology

## 2016-06-25 ENCOUNTER — Encounter (HOSPITAL_COMMUNITY): Payer: Self-pay

## 2016-06-25 DIAGNOSIS — R160 Hepatomegaly, not elsewhere classified: Secondary | ICD-10-CM | POA: Diagnosis not present

## 2016-06-25 DIAGNOSIS — R935 Abnormal findings on diagnostic imaging of other abdominal regions, including retroperitoneum: Secondary | ICD-10-CM | POA: Diagnosis not present

## 2016-06-25 DIAGNOSIS — K760 Fatty (change of) liver, not elsewhere classified: Secondary | ICD-10-CM | POA: Diagnosis not present

## 2016-06-25 DIAGNOSIS — C50112 Malignant neoplasm of central portion of left female breast: Secondary | ICD-10-CM

## 2016-06-25 DIAGNOSIS — K4020 Bilateral inguinal hernia, without obstruction or gangrene, not specified as recurrent: Secondary | ICD-10-CM | POA: Diagnosis not present

## 2016-06-25 DIAGNOSIS — I700 Atherosclerosis of aorta: Secondary | ICD-10-CM | POA: Diagnosis not present

## 2016-06-25 DIAGNOSIS — M47896 Other spondylosis, lumbar region: Secondary | ICD-10-CM | POA: Insufficient documentation

## 2016-06-25 DIAGNOSIS — M1990 Unspecified osteoarthritis, unspecified site: Secondary | ICD-10-CM | POA: Diagnosis not present

## 2016-06-25 DIAGNOSIS — Z9012 Acquired absence of left breast and nipple: Secondary | ICD-10-CM | POA: Diagnosis not present

## 2016-06-25 MED ORDER — IOPAMIDOL (ISOVUE-300) INJECTION 61%
100.0000 mL | Freq: Once | INTRAVENOUS | Status: AC | PRN
  Administered 2016-06-25: 100 mL via INTRAVENOUS

## 2016-06-25 MED ORDER — TECHNETIUM TC 99M MEDRONATE IV KIT
25.0000 | PACK | Freq: Once | INTRAVENOUS | Status: AC | PRN
  Administered 2016-06-25: 24.9 via INTRAVENOUS

## 2016-06-26 ENCOUNTER — Other Ambulatory Visit: Payer: Self-pay | Admitting: *Deleted

## 2016-06-26 ENCOUNTER — Other Ambulatory Visit (HOSPITAL_BASED_OUTPATIENT_CLINIC_OR_DEPARTMENT_OTHER): Payer: Medicare PPO

## 2016-06-26 ENCOUNTER — Ambulatory Visit (HOSPITAL_BASED_OUTPATIENT_CLINIC_OR_DEPARTMENT_OTHER): Payer: Medicare PPO | Admitting: Hematology

## 2016-06-26 ENCOUNTER — Telehealth: Payer: Self-pay | Admitting: Hematology

## 2016-06-26 ENCOUNTER — Telehealth: Payer: Self-pay | Admitting: *Deleted

## 2016-06-26 VITALS — BP 158/61 | HR 108 | Temp 98.5°F | Resp 17 | Ht 69.0 in | Wt 230.8 lb

## 2016-06-26 DIAGNOSIS — Z853 Personal history of malignant neoplasm of breast: Secondary | ICD-10-CM | POA: Diagnosis not present

## 2016-06-26 DIAGNOSIS — I10 Essential (primary) hypertension: Secondary | ICD-10-CM

## 2016-06-26 DIAGNOSIS — E119 Type 2 diabetes mellitus without complications: Secondary | ICD-10-CM | POA: Diagnosis not present

## 2016-06-26 DIAGNOSIS — C50112 Malignant neoplasm of central portion of left female breast: Secondary | ICD-10-CM | POA: Diagnosis not present

## 2016-06-26 DIAGNOSIS — Z170 Estrogen receptor positive status [ER+]: Secondary | ICD-10-CM

## 2016-06-26 LAB — CBC WITH DIFFERENTIAL/PLATELET
BASO%: 0.3 % (ref 0.0–2.0)
Basophils Absolute: 0 10*3/uL (ref 0.0–0.1)
EOS%: 3.1 % (ref 0.0–7.0)
Eosinophils Absolute: 0.2 10*3/uL (ref 0.0–0.5)
HCT: 37.6 % (ref 34.8–46.6)
HGB: 12.9 g/dL (ref 11.6–15.9)
LYMPH%: 33.8 % (ref 14.0–49.7)
MCH: 32.8 pg (ref 25.1–34.0)
MCHC: 34.3 g/dL (ref 31.5–36.0)
MCV: 95.7 fL (ref 79.5–101.0)
MONO#: 0.5 10*3/uL (ref 0.1–0.9)
MONO%: 5.8 % (ref 0.0–14.0)
NEUT#: 4.5 10*3/uL (ref 1.5–6.5)
NEUT%: 57 % (ref 38.4–76.8)
Platelets: 307 10*3/uL (ref 145–400)
RBC: 3.93 10*6/uL (ref 3.70–5.45)
RDW: 12.6 % (ref 11.2–14.5)
WBC: 7.8 10*3/uL (ref 3.9–10.3)
lymph#: 2.6 10*3/uL (ref 0.9–3.3)

## 2016-06-26 LAB — COMPREHENSIVE METABOLIC PANEL
ALT: 54 U/L (ref 0–55)
AST: 50 U/L — ABNORMAL HIGH (ref 5–34)
Albumin: 3.8 g/dL (ref 3.5–5.0)
Alkaline Phosphatase: 67 U/L (ref 40–150)
Anion Gap: 9 mEq/L (ref 3–11)
BUN: 13 mg/dL (ref 7.0–26.0)
CO2: 28 mEq/L (ref 22–29)
Calcium: 9.8 mg/dL (ref 8.4–10.4)
Chloride: 102 mEq/L (ref 98–109)
Creatinine: 0.9 mg/dL (ref 0.6–1.1)
EGFR: 82 mL/min/{1.73_m2} — ABNORMAL LOW (ref >90)
Glucose: 175 mg/dl — ABNORMAL HIGH (ref 70–140)
Potassium: 4.5 mEq/L (ref 3.5–5.1)
Sodium: 139 mEq/L (ref 136–145)
Total Bilirubin: 0.73 mg/dL (ref 0.20–1.20)
Total Protein: 8.3 g/dL (ref 6.4–8.3)

## 2016-06-26 MED ORDER — LIDOCAINE-PRILOCAINE 2.5-2.5 % EX CREA: TOPICAL_CREAM | CUTANEOUS | 3 refills | Status: DC

## 2016-06-26 MED ORDER — PROCHLORPERAZINE MALEATE 10 MG PO TABS: 10.0000 mg | ORAL_TABLET | Freq: Four times a day (QID) | ORAL | 1 refills | Status: DC | PRN

## 2016-06-26 MED ORDER — ONDANSETRON HCL 8 MG PO TABS: 8.0000 mg | ORAL_TABLET | Freq: Two times a day (BID) | ORAL | 1 refills | Status: DC | PRN

## 2016-06-26 MED ORDER — DEXAMETHASONE 4 MG PO TABS: 4.0000 mg | ORAL_TABLET | Freq: Two times a day (BID) | ORAL | 1 refills | Status: DC

## 2016-06-26 NOTE — Progress Notes (Signed)
[place]  Telephone:([phone]) [phone] Fax:([phone]) [phone]  Clinic Follow Up Note   Patient Care Team: [name], NP as PCP - General (Internal Medicine) [name], MD as Consulting Physician (General Surgery) 06/26/2016   CHIEF COMPLAINTS:  Follow up recurrent left breast cancer   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 [facility])   Staging form: Breast, AJCC 7th Edition     Clinical stage from 04/17/2016: Stage IA (T1c, N0, M0) - Signed by [name], MD on 05/07/2016     Pathologic stage from 05/28/2016: Stage IIA (T2, N0, cM0) - Signed by [name], MD on 06/11/2016 History of left breast cancer   Staging form: Breast, AJCC 7th Edition     Clinical: Stage IIIB (T4d, N1, cM0) - Signed by [name], MD on 12/14/2013     Pathologic: Stage IIIB (T4d, N1a, cM0) - Signed by [name], MD on 12/14/2013       History of left breast cancer   09/12/2002 Imaging    CT scan show no evidence of disease apart from the left axilla     09/2002 -  Neo-Adjuvant Chemotherapy    TAC X4 cycles      01/10/2003 Surgery    The patient had left lumpectomy and axillary lymph node dissection which show residual breast cancer and a sentinel lymph node was positive     2004 -  Adjuvant Chemotherapy    Cytoxan with 5-FU x4 cycles (methotrexate added at cycle 4).     2004 -  Radiation Therapy    Adjuvant left breast radiation     12/2002 Receptors her2    ER, PR and HER-2 were all negative.      Cancer of central portion of left breast ([place])   04/13/2016 Mammogram    Scheduler coarse heterogeneous calcifications spanning an area of 3.7 cm in the lumpectomy site, indeterminate. Ultrasound of the left axilla was negative.     04/17/2016 Initial Diagnosis    Cancer of central portion of left breast ([place])     04/17/2016 Initial Biopsy    Left breast core needle biopsy showed invasive and in situ ductal carcinoma with calcification, grade 2.     04/17/2016 Receptors  her2    Your 100% positive, PR 15% positive, strong staining, Ki-67 10%, HER-2 positive by IHC (3)     05/05/2016 Imaging    Bilateral breast MRI showed a 1.3 x 0.8 x 0.7 cm biopsy-proven ductal carcinoma in the region of the previous lumpectomy. Enlarged lobulated right inferior axillary lymph node with diffuse cortical thickening, biopsy is recommended.     05/15/2016 Pathology Results    right axilary node biopsy was negative for malignant cell      05/28/2016 Surgery    Simple left mastectomy     05/28/2016 Pathology Results    Left mastectomy showed invasive ductal carcinoma, grade 3, 3.6 cm, high grade DCIS, (+) LVI, surgical margins were negative. Tumor 3.6 cm, no lymph nodes identified.        HISTORY OF PRESENTING ILLNESS:  Lindsey Marquez 65 y.o. female is here because of her recurrent left breast cancer.  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 She was seen by multiple medical oncologist in our practice in the past, last was seen by Dr. Alvy Bimler in 11/2013.  She has been doing well, and is compliant  with annual screening mammogram. The screening mammogram on 04/17/2016 showed calcification in the left breast, and core needle biopsy showed invasive and in situ ductal carcinoma, grade 2, ER/PR positive, HER-2 amplified. She was referred to seeing breast surgeon Dr. Georgette Dover and referred back to [place].   She feels well, she did not feel any lump in her breast or axillas latley. She denies any pain, dyspnea, or GI symptoms. She had right nipple rash and right axillary swelling in the past one week, after she drinks some diet tea with citric. Breast MRI showed an enlarged lobulated right inferior axillary lymph node with diffuse cortical thickening, in addition to the known left breast mass which measured 1.3 cm on MRI.  GYN HISTORY  Menarchal: 12 LMP:  2003 Contraceptive: 20 HRT: n/a  G2P2: 36 yo Son and 3 yo son who died   CURRENT THERAPY: pending adjuvant chemotherapy  INTERIM HISTORY: Lindsey Marquez returns for follow-up, discussed her staging scan findings and finalize her adjuvant chemo. She is recovered well from her surgery. No significant pain at his incision site, she has good appetite and energy level, no other new complaints.  MEDICAL HISTORY:  Past Medical History:  Diagnosis Date  . Anxiety   . Breast cancer [facility]) August 2002   Invasive ductal carcinoma Left breast.  . Diabetes mellitus without complication ([place])   . Family history of malignant neoplasm of ovary   . Family history of pancreatic cancer   . Hypertension     SURGICAL HISTORY: Past Surgical History:  Procedure Laterality Date  . BREAST SURGERY Left 2003  . FOOT SURGERY  2000  . MASTECTOMY Left 05/28/2016  . port a cath insertion  05/28/2016  . PORTACATH PLACEMENT Right 05/28/2016   Procedure: INSERTION PORT-A-CATH;  Surgeon: [name], MD;  Location: [place];  Service: General;  Laterality: Right;  . TOTAL MASTECTOMY Left 05/28/2016   Procedure: LEFT  MASTECTOMY;  Surgeon: [name], MD;  Location: [place];  Service: General;  Laterality: Left;  . TUBAL LIGATION  22 yrs since  2004.   Bilateral.    SOCIAL HISTORY: Social History   Social History  . Marital status: Divorced    Spouse name: N/A  . Number of children: N/A  . Years of education: N/A   Occupational History  . Not on file.   Social History Main Topics  . Smoking status: Former Smoker    Packs/day: 0.10    Years: 7.00    Quit date: 12/01/1975  . Smokeless tobacco: Never Used  . Alcohol use No  . Drug use: No  . Sexual activity: Yes   Other Topics Concern  . Not on file   Social History Narrative  . No narrative on file   She is retired from school   FAMILY HISTORY: Family History  Problem Relation Age of Onset  . Prostate cancer Father   . Ovarian cancer Maternal  Aunt 12    deceased  . Cancer Maternal Uncle     "bone ca"; unk. primary; deceased 34s  . Pancreatic cancer Mother 57    deceased  . Cancer Maternal Aunt     2 other mat aunts; unk. primary in 80s; deceased  . Prostate cancer Maternal Uncle     deceased 7    ALLERGIES:  is allergic to codeine.  MEDICATIONS:  Current Outpatient Prescriptions  Medication Sig Dispense Refill  . aspirin 81 MG tablet Take 81 mg by mouth daily.     . furosemide (  LASIX) 20 MG tablet Take 0.5 tablets (10 mg total) by mouth daily as needed. 30 tablet 1  . glyBURIDE-metformin (GLUCOVANCE) 2.5-500 MG per tablet Take 1 tablet by mouth 2 (two) times daily with a meal.      . ibuprofen (ADVIL,MOTRIN) 200 MG tablet Take 200 mg by mouth every 8 (eight) hours as needed.      Marland Kitchen lisinopril-hydrochlorothiazide (PRINZIDE,ZESTORETIC) 20-25 MG per tablet Take 1 tablet by mouth daily.      . Omega-3 Fatty Acids (FISH OIL) 1200 MG CAPS Take 1 capsule by mouth daily.    . Potassium 99 MG TABS Take 1 tablet by mouth daily.    . traMADol (ULTRAM) 50 MG tablet Take 2 tablets (100 mg total) by mouth every 6 (six) hours as needed for moderate pain. 20 tablet 0   No current facility-administered medications for this visit.     REVIEW OF SYSTEMS:   Constitutional: Denies fevers, chills or abnormal night sweats Eyes: Denies blurriness of vision, double vision or watery eyes Ears, nose, mouth, throat, and face: Denies mucositis or sore throat Respiratory: Denies cough, dyspnea or wheezes Cardiovascular: Denies palpitation, chest discomfort or lower extremity swelling Gastrointestinal:  Denies nausea, heartburn or change in bowel habits Skin: Denies abnormal skin rashes Lymphatics: Denies new lymphadenopathy or easy bruising Neurological:Denies numbness, tingling or new weaknesses Behavioral/Psych: Mood is stable, no new changes  All other systems were reviewed with the patient and are negative.  PHYSICAL EXAMINATION: ECOG  PERFORMANCE STATUS: 0  Vitals:   06/26/16 1203  BP: (!) 158/61  Pulse: (!) 108  Resp: 17  Temp: 98.5 F (36.9 C)   Filed Weights   06/26/16 1203  Weight: 230 lb 12.8 oz (104.7 kg)    GENERAL:alert, no distress and comfortable SKIN: skin color, texture, turgor are normal, no rashes or significant lesions EYES: normal, conjunctiva are pink and non-injected, sclera clear OROPHARYNX:no exudate, no erythema and lips, buccal mucosa, and tongue normal  NECK: supple, thyroid normal size, non-tender, without nodularity LYMPH:  no palpable lymphadenopathy in the cervical, axillary or inguinal LUNGS: clear to auscultation and percussion with normal breathing effort HEART: regular rate & rhythm and no murmurs and no lower extremity edema ABDOMEN:abdomen soft, non-tender and normal bowel sounds Musculoskeletal:no cyanosis of digits and no clubbing  PSYCH: alert & oriented x 3 with fluent speech NEURO: no focal motor/sensory deficits Breasts: Breast inspection showed left breast is surgically absent, surgical incision has healed well. Exam of the right breast and bilateral axillas reviewed no palpable mass or adenopathy.   LABORATORY DATA:  I have reviewed the data as listed CBC Latest Ref Rng & Units  06/26/2016 05/29/2016  WBC 3.9 - 10.3 10e3/uL  7.8 10.8(H)  Hemoglobin 11.6 - 15.9 g/dL  12.9 11.7(L)  Hematocrit 34.8 - 46.6 %  37.6 35.8(L)  Platelets 145 - 400 10e3/uL  307 331   CMP Latest Ref Rng & Units  06/26/2016 05/29/2016  Glucose 70 - 140 mg/dl  175(H) 140(H)  BUN 7.0 - 26.0 mg/dL  13.0 9  Creatinine 0.6 - 1.1 mg/dL  0.9 0.61  Sodium 136 - 145 mEq/L  139 135  Potassium 3.5 - 5.1 mEq/L  4.5 4.0  Chloride 101 - 111 mmol/L  - 104  CO2 22 - 29 mEq/L  28 25  Calcium 8.4 - 10.4 mg/dL  9.8 8.8(L)  Total Protein 6.4 - 8.3 g/dL  8.3 -  Total Bilirubin 0.20 - 1.20 mg/dL  0.73 -  Alkaline Phos  40 - 150 U/L  67 -  AST 5 - 34 U/L  50(H) -  ALT 0 - 55 U/L  54 -    PATHOLOGY REPORT    Diagnosis 04/17/2016 Breast, left, needle core biopsy, inner mid breast - INVASIVE DUCTAL CARCINOMA WITH CALCIFICATIONS, SEE COMMENT. - DUCTAL CARCINOMA IN SITU WITH COMEDONECROSIS. Microscopic Comment While grading is best performed on the resection specimen the invasive carcinoma appears grade 2. Prognostic markers will be ordered and reported in an addendum. Dr. Lyndon Code has reviewed the case. The case was called to [facility] on 04/20/2016.  Results: HER2 - *EQUIVOCAL* (Her2 by IHC will be ordered.) RATIO OF HER2/CEP17 SIGNALS 1.73 AVERAGE HER2 COPY NUMBER PER CELL 4.15 By immunohistochemistry, the tumor cells are positive for Her2 (3).  Results: IMMUNOHISTOCHEMICAL AND MORPHOMETRIC ANALYSIS PERFORMED MANUALLY Estrogen Receptor: 100%, POSITIVE, STRONG STAINING INTENSITY Progesterone Receptor: 15%, POSITIVE, STRONG STAINING INTENSITY Proliferation Marker Ki67: 10%  Diagnosis 05/15/2016 Lymph node, needle/core biopsy, right axilla - BENIGN REACTIVE LYMPH NODE. - NO GRANULOMAS OR MALIGNANCY IDENTIFIED. - SEE COMMENT. Microscopic Comment Internal departmental review obtained (Dr. Lyndon Code) with agreement. Results are phoned to [place] (05/18/16). (MEG:gt, 05/18/16)   Breast, simple mastectomy, Left 05/28/2016 - INVASIVE DUCTAL CARCINOMA, GRADE III/III, SPANNING 3.6 CM. - DUCTAL CARCINOMA IN SITU, HIGH GRADE. - LYMPHOVASCULAR INVASION IS IDENTIFIED. - THE SURGICAL RESECTION MARGINS ARE NEGATIVE FOR CARCINOMA. - SEE ONCOLOGY TABLE BELOW. Microscopic Comment BREAST, INVASIVE TUMOR, WITHOUT LYMPH NODES PRESENT Specimen, including laterality : Left breast Procedure: Simple mastectomy Histologic type: Ductal with micropapillary features Grade: III Tubule formation: 3 Nuclear pleomorphism: 2 Mitotic: 3 Tumor size (gross measurement): 3.6 cm Margins: Greater than 0.2 cm to all margins Lymphovascular invasion: Present Ductal carcinoma in  situ: Present Grade: High grade Extensive intraductal component: Yes Lobular neoplasia: Not identified Tumor focality: Unifocal Treatment effect: N/A Extent of tumor: Confined to breast parenchyma. Breast prognostic profile: [phone] Estrogen receptor: 100%, strong staining intensity Progesterone receptor: 15%, strong staining intensity Her 2 neu: Amplification was detected by immunohistochemistry. Ki-67: 10% 1 of 2 FINAL for ALEXSUS, PAPADOPOULOS ([id number]) Microscopic Comment(continued) Non-neoplastic breast: No significant findings. TNM: pT2, pNX (clinically recurrent). Comments: In addition to the main tumor, there is ductal carcinoma in situ present in random tissue submitted from the inferior lateral quadrant. (JBK:kh 06-01-16)   RADIOGRAPHIC STUDIES: I have personally reviewed the radiological images as listed and agreed with the findings in the report. Ct Chest W Contrast  Result Date: 06/25/2016 CLINICAL DATA:  Restaging of left breast cancer diagnosed in 2003 in 2017. Mastectomy earlier this year. Chemotherapy to start. EXAM: CT CHEST, ABDOMEN, AND PELVIS WITH CONTRAST TECHNIQUE: Multidetector CT imaging of the chest, abdomen and pelvis was performed following the standard protocol during bolus administration of intravenous contrast. CONTRAST:  19m ISOVUE-300 IOPAMIDOL (ISOVUE-300) INJECTION 61% COMPARISON:  Today's bone scan, dictated separately. Breast MR 05/05/2016. Chest radiograph 05/28/2016. Report of chest abdomen and pelvic CTs of 09/12/2002. FINDINGS: CT CHEST FINDINGS Mediastinum/Lymph Nodes: No supraclavicular adenopathy. A right Port-A-Cath terminates at the high right atrium. Right axillary nodes measure up to 1.0 short axis on image 20/series 7. Left axillary node dissection. No left axillary adenopathy. No subpectoral adenopathy. Left mastectomy a with presumed postoperative fluid collection in the chest wall. 14.7 x 3.5 cm. Aortic atherosclerosis. Normal heart  size, without pericardial effusion. No central pulmonary embolism, on this non-dedicated study. No mediastinal or hilar adenopathy. No internal mammary adenopathy. Lungs/Pleura: No pleural fluid.  Clear lungs. Musculoskeletal: No acute osseous abnormality. CT ABDOMEN PELVIS FINDINGS Hepatobiliary: Moderate hepatic steatosis with hepatomegaly at 21.0 cm craniocaudal. No focal liver lesion. Normal gallbladder, without biliary ductal dilatation. Pancreas: Normal, without mass or ductal dilatation. Spleen: Normal in size, without focal abnormality. Adrenals/Urinary Tract: Normal adrenal glands. Normal kidneys, without hydronephrosis. Normal urinary bladder. Stomach/Bowel: Normal stomach, without wall thickening. Normal colon, appendix, and terminal ileum. Normal small bowel. Vascular/Lymphatic: Aortic and branch vessel atherosclerosis. No abdominopelvic adenopathy. Reproductive: Central uterine hypoattenuation at 11 mm on image 103 sagittal. No adnexal mass. Other: No significant free fluid. Fat containing bilateral inguinal hernias are tiny. There is also a fat containing ventral abdominal wall hernia on image 85/series 2. Musculoskeletal: No suspicious osseous lesion. Degenerative disc disease is advanced,/at L4-5 and L5-S1. Convex right lumbar spine curvature. IMPRESSION: 1. Left mastectomy with postoperative fluid collection in the left chest wall. This most likely a postoperative seroma or hematoma. 2. Prominent right axillary nodes, as detailed on prior MRI and sampled on 05/15/2016. 3. Otherwise, no evidence of metastatic disease in the chest, abdomen, or pelvis. 4. Hepatic steatosis and hepatomegaly. 5.  Aortic atherosclerosis. 6. Fat containing ventral and bilateral inguinal hernias. 7. Central uterine hypoattenuation which is abnormal for patient age. Correlate with postmenopausal bleeding. Especially if there is any such symptoms, consider pelvic ultrasound. Electronically Signed   By: Abigail Miyamoto M.D.   On:  06/25/2016 16:16  Nm Bone Scan Whole Body  Result Date: 06/25/2016 CLINICAL DATA:  History of breast carcinoma and left mastectomy, evaluate for metastatic disease EXAM: NUCLEAR MEDICINE WHOLE BODY BONE SCAN TECHNIQUE: Whole body anterior and posterior images were obtained approximately 3 hours after intravenous injection of radiopharmaceutical. RADIOPHARMACEUTICALS:  24.9 mCi Technetium-21mMDP IV COMPARISON:  Total body bone scan of 12/14/2003 FINDINGS: Compared to the study from 2005, very little interval change is seen. Again there is probable degenerative change in the lower lumbar spine, knees, and mid feet. However no evidence of bone metastasis is is seen. IMPRESSION: No evidence of bone metastasis. Degenerative changes in the lower lumbar spine, knees, and mid feet as noted previously. Electronically Signed   By: PIvar DrapeM.D.   On: 06/25/2016 14:20  Ct Abdomen Pelvis W Contrast  Result Date: 06/25/2016 CLINICAL DATA:  Restaging of left breast cancer diagnosed in 2003 in 2017. Mastectomy earlier this year. Chemotherapy to start. EXAM: CT CHEST, ABDOMEN, AND PELVIS WITH CONTRAST TECHNIQUE: Multidetector CT imaging of the chest, abdomen and pelvis was performed following the standard protocol during bolus administration of intravenous contrast. CONTRAST:  1076mISOVUE-300 IOPAMIDOL (ISOVUE-300) INJECTION 61% COMPARISON:  Today's bone scan, dictated separately. Breast MR 05/05/2016. Chest radiograph 05/28/2016. Report of chest abdomen and pelvic CTs of 09/12/2002. FINDINGS: CT CHEST FINDINGS Mediastinum/Lymph Nodes: No supraclavicular adenopathy. A right Port-A-Cath terminates at the high right atrium. Right axillary nodes measure up to 1.0 short axis on image 20/series 7. Left axillary node dissection. No left axillary adenopathy. No subpectoral adenopathy. Left mastectomy a with presumed postoperative fluid collection in the chest wall. 14.7 x 3.5 cm. Aortic atherosclerosis. Normal heart size,  without pericardial effusion. No central pulmonary embolism, on this non-dedicated study. No mediastinal or hilar adenopathy. No internal mammary adenopathy. Lungs/Pleura: No pleural fluid.  Clear lungs. Musculoskeletal: No acute osseous abnormality. CT ABDOMEN PELVIS FINDINGS Hepatobiliary: Moderate hepatic steatosis with hepatomegaly at 21.0 cm craniocaudal. No focal liver lesion. Normal gallbladder, without biliary ductal dilatation. Pancreas: Normal, without mass or ductal dilatation. Spleen: Normal in size, without focal  abnormality. Adrenals/Urinary Tract: Normal adrenal glands. Normal kidneys, without hydronephrosis. Normal urinary bladder. Stomach/Bowel: Normal stomach, without wall thickening. Normal colon, appendix, and terminal ileum. Normal small bowel. Vascular/Lymphatic: Aortic and branch vessel atherosclerosis. No abdominopelvic adenopathy. Reproductive: Central uterine hypoattenuation at 11 mm on image 103 sagittal. No adnexal mass. Other: No significant free fluid. Fat containing bilateral inguinal hernias are tiny. There is also a fat containing ventral abdominal wall hernia on image 85/series 2. Musculoskeletal: No suspicious osseous lesion. Degenerative disc disease is advanced,/at L4-5 and L5-S1. Convex right lumbar spine curvature. IMPRESSION: 1. Left mastectomy with postoperative fluid collection in the left chest wall. This most likely a postoperative seroma or hematoma. 2. Prominent right axillary nodes, as detailed on prior MRI and sampled on 05/15/2016. 3. Otherwise, no evidence of metastatic disease in the chest, abdomen, or pelvis. 4. Hepatic steatosis and hepatomegaly. 5.  Aortic atherosclerosis. 6. Fat containing ventral and bilateral inguinal hernias. 7. Central uterine hypoattenuation which is abnormal for patient age. Correlate with postmenopausal bleeding. Especially if there is any such symptoms, consider pelvic ultrasound. Electronically Signed   By: Abigail Miyamoto M.D.   On:  06/25/2016 16:16  Dg Chest Port 1 View  Result Date: 05/28/2016 CLINICAL DATA:  Port-A-Cath placement EXAM: PORTABLE CHEST 1 VIEW COMPARISON:  10/28/2010 FINDINGS: There is a right-sided Port-A-Cath with the tip projecting over the right atrium. There is a left basilar chest tube. There is no pneumothorax. There is right basilar atelectasis. There is no pleural effusion. The heart mediastinum are stable. There is evidence of prior left axillary dissection. The osseous structures are unremarkable. IMPRESSION: 1. Right-sided Port-A-Cath with the tip projecting over the right atrium. Electronically Signed   By: Kathreen Devoid   On: 05/28/2016 14:28   Dg Fluoro Guide Cv Line-no Report  Result Date: 05/28/2016 CLINICAL DATA:  FLOURO GUIDE CV LINE Fluoroscopy was utilized by the requesting physician.  No radiographic interpretation.    ASSESSMENT & PLAN:  65 year old African-American female, postmenopausal, history of left breast triple negative cancer in 2003, presented with mammogram discovered left breast cancer, triple positive.  1. Cancer of central portion of left breast, pT2NxM0, stage IIA, G3, triple positive -I reviewed her surgical pathology findings in great details with patient and her family member. It showed a 3.6 cm mass, grade 3, surgical margins were negative. She had no sentinel lymph node biopsy due to her prior history of axillary lymph node dissection. -Her restaging CT scan of bone scan showed no evidence of distant metastasis, I reviewed with patient. -We discussed the risk of cancer recurrence of this complete surgical resection. We reviewed that HER-2 positive with cancers are more aggressive, with increased risk of metastasis.  -Given the moderate to high risk of cancer recurrence, I recommend adjuvant chemotherapy to reduce her risk of cancer recurrence. I recommend chemotherapy and duo anti-HER-2 agents TCHP (docetaxel, carbotaxol, Herceptin, pejeta), every 3 weeks for total of  6 cycles, followed by maintenance Herceptin every 3 weeks to complete 1 year treatment. -She has recovered well from surgery, lab reviewed, adequate for treatment. I'll schedule her to start chemotherapy in a few weeks  -Her baseline echo was normal. I'll refer her to see a cardiologist to monitor her heart during her anti-her2 therapy. --Given her strong ER/PR positivity, I would also recommend adjuvant aromatase inhibitor for 5-10 years, starting after her chemo.   2. DM and HTN  -She'll continue follow-up with her primary care physician  Plan -start adjuvant chemo TCHP first cycle  on 8/7 -I will see her back in 1 week after first cycle chemotherapy for toxicity check up  All questions were answered. The patient knows to call the clinic with any problems, questions or concerns.  I spent 20 minutes counseling the patient face to face. The total time spent in the appointment was 25 minutes and more than 50% was on counseling.     [name], MD 06/26/2016

## 2016-06-26 NOTE — Telephone Encounter (Signed)
Unable to leave message to inform pt of 8/7 appt date/times. Letter sent by mail

## 2016-06-26 NOTE — Telephone Encounter (Signed)
cld pt neice and left message for ECHO appt 8/4@9 @[facility]  and 8/7appt@9 :15

## 2016-06-26 NOTE — Telephone Encounter (Signed)
Per staff phone call and POF I have schedueld appts. Scheduler advised of appts.  JMW  

## 2016-06-29 ENCOUNTER — Other Ambulatory Visit: Payer: Self-pay | Admitting: *Deleted

## 2016-07-01 ENCOUNTER — Encounter: Payer: Medicare PPO | Admitting: Gastroenterology

## 2016-07-03 ENCOUNTER — Encounter (HOSPITAL_COMMUNITY): Payer: Self-pay

## 2016-07-03 ENCOUNTER — Ambulatory Visit (HOSPITAL_COMMUNITY): Admission: RE | Admit: 2016-07-03 | Payer: Medicare PPO | Source: Ambulatory Visit

## 2016-07-06 ENCOUNTER — Other Ambulatory Visit (HOSPITAL_BASED_OUTPATIENT_CLINIC_OR_DEPARTMENT_OTHER): Payer: Medicare PPO

## 2016-07-06 ENCOUNTER — Other Ambulatory Visit: Payer: Self-pay | Admitting: *Deleted

## 2016-07-06 ENCOUNTER — Encounter: Payer: Self-pay | Admitting: *Deleted

## 2016-07-06 ENCOUNTER — Ambulatory Visit (HOSPITAL_BASED_OUTPATIENT_CLINIC_OR_DEPARTMENT_OTHER): Payer: Medicare PPO

## 2016-07-06 ENCOUNTER — Ambulatory Visit: Payer: Medicare PPO

## 2016-07-06 ENCOUNTER — Other Ambulatory Visit: Payer: Self-pay | Admitting: Hematology

## 2016-07-06 VITALS — BP 140/67 | HR 81 | Temp 98.6°F | Resp 18

## 2016-07-06 DIAGNOSIS — Z5111 Encounter for antineoplastic chemotherapy: Secondary | ICD-10-CM | POA: Diagnosis not present

## 2016-07-06 DIAGNOSIS — C50112 Malignant neoplasm of central portion of left female breast: Secondary | ICD-10-CM | POA: Diagnosis not present

## 2016-07-06 DIAGNOSIS — Z5189 Encounter for other specified aftercare: Secondary | ICD-10-CM

## 2016-07-06 DIAGNOSIS — Z5112 Encounter for antineoplastic immunotherapy: Secondary | ICD-10-CM | POA: Diagnosis not present

## 2016-07-06 LAB — CBC WITH DIFFERENTIAL/PLATELET
BASO%: 0.1 % (ref 0.0–2.0)
Basophils Absolute: 0 10*3/uL (ref 0.0–0.1)
EOS%: 0.1 % (ref 0.0–7.0)
Eosinophils Absolute: 0 10*3/uL (ref 0.0–0.5)
HCT: 37.2 % (ref 34.8–46.6)
HGB: 12.8 g/dL (ref 11.6–15.9)
LYMPH%: 14.4 % (ref 14.0–49.7)
MCH: 32.7 pg (ref 25.1–34.0)
MCHC: 34.4 g/dL (ref 31.5–36.0)
MCV: 94.9 fL (ref 79.5–101.0)
MONO#: 0.2 10*3/uL (ref 0.1–0.9)
MONO%: 2.6 % (ref 0.0–14.0)
NEUT#: 7.7 10*3/uL — ABNORMAL HIGH (ref 1.5–6.5)
NEUT%: 82.8 % — ABNORMAL HIGH (ref 38.4–76.8)
Platelets: 311 10*3/uL (ref 145–400)
RBC: 3.92 10*6/uL (ref 3.70–5.45)
RDW: 12.6 % (ref 11.2–14.5)
WBC: 9.3 10*3/uL (ref 3.9–10.3)
lymph#: 1.3 10*3/uL (ref 0.9–3.3)

## 2016-07-06 LAB — COMPREHENSIVE METABOLIC PANEL
ALT: 39 U/L (ref 0–55)
AST: 29 U/L (ref 5–34)
Albumin: 3.7 g/dL (ref 3.5–5.0)
Alkaline Phosphatase: 72 U/L (ref 40–150)
Anion Gap: 12 mEq/L — ABNORMAL HIGH (ref 3–11)
BUN: 15.6 mg/dL (ref 7.0–26.0)
CO2: 22 mEq/L (ref 22–29)
Calcium: 9.9 mg/dL (ref 8.4–10.4)
Chloride: 105 mEq/L (ref 98–109)
Creatinine: 0.8 mg/dL (ref 0.6–1.1)
EGFR: 87 mL/min/{1.73_m2} — ABNORMAL LOW (ref >90)
Glucose: 275 mg/dl — ABNORMAL HIGH (ref 70–140)
Potassium: 4.1 mEq/L (ref 3.5–5.1)
Sodium: 138 mEq/L (ref 136–145)
Total Bilirubin: 0.41 mg/dL (ref 0.20–1.20)
Total Protein: 8.3 g/dL (ref 6.4–8.3)

## 2016-07-06 MED ORDER — SODIUM CHLORIDE 0.9 % IV SOLN
Freq: Once | INTRAVENOUS | Status: AC
  Administered 2016-07-06: 10:00:00 via INTRAVENOUS

## 2016-07-06 MED ORDER — PALONOSETRON HCL INJECTION 0.25 MG/5ML
0.2500 mg | Freq: Once | INTRAVENOUS | Status: AC
  Administered 2016-07-06: 0.25 mg via INTRAVENOUS

## 2016-07-06 MED ORDER — DIPHENHYDRAMINE HCL 25 MG PO CAPS
ORAL_CAPSULE | ORAL | Status: AC
  Filled 2016-07-06: qty 2

## 2016-07-06 MED ORDER — SODIUM CHLORIDE 0.9 % IV SOLN
706.2000 mg | Freq: Once | INTRAVENOUS | Status: AC
  Administered 2016-07-06: 710 mg via INTRAVENOUS
  Filled 2016-07-06: qty 71

## 2016-07-06 MED ORDER — PEGFILGRASTIM 6 MG/0.6ML ~~LOC~~ PSKT
6.0000 mg | PREFILLED_SYRINGE | Freq: Once | SUBCUTANEOUS | Status: AC
  Administered 2016-07-06: 6 mg via SUBCUTANEOUS
  Filled 2016-07-06: qty 0.6

## 2016-07-06 MED ORDER — HEPARIN SOD (PORK) LOCK FLUSH 100 UNIT/ML IV SOLN
500.0000 [IU] | Freq: Once | INTRAVENOUS | Status: AC | PRN
  Administered 2016-07-06: 500 [IU]
  Filled 2016-07-06: qty 5

## 2016-07-06 MED ORDER — ACETAMINOPHEN 325 MG PO TABS
650.0000 mg | ORAL_TABLET | Freq: Once | ORAL | Status: AC
  Administered 2016-07-06: 650 mg via ORAL

## 2016-07-06 MED ORDER — SODIUM CHLORIDE 0.9 % IV SOLN
840.0000 mg | Freq: Once | INTRAVENOUS | Status: AC
  Administered 2016-07-06: 840 mg via INTRAVENOUS
  Filled 2016-07-06: qty 28

## 2016-07-06 MED ORDER — HEPARIN SOD (PORK) LOCK FLUSH 100 UNIT/ML IV SOLN
500.0000 [IU] | Freq: Once | INTRAVENOUS | Status: DC | PRN
  Filled 2016-07-06: qty 5

## 2016-07-06 MED ORDER — DEXAMETHASONE SODIUM PHOSPHATE 100 MG/10ML IJ SOLN
10.0000 mg | Freq: Once | INTRAMUSCULAR | Status: AC
  Administered 2016-07-06: 10 mg via INTRAVENOUS
  Filled 2016-07-06: qty 1

## 2016-07-06 MED ORDER — SODIUM CHLORIDE 0.9 % IV SOLN
75.0000 mg/m2 | Freq: Once | INTRAVENOUS | Status: AC
  Administered 2016-07-06: 170 mg via INTRAVENOUS
  Filled 2016-07-06: qty 17

## 2016-07-06 MED ORDER — DIPHENHYDRAMINE HCL 25 MG PO CAPS
50.0000 mg | ORAL_CAPSULE | Freq: Once | ORAL | Status: AC
  Administered 2016-07-06: 50 mg via ORAL

## 2016-07-06 MED ORDER — TRASTUZUMAB CHEMO 150 MG IV SOLR
8.0000 mg/kg | Freq: Once | INTRAVENOUS | Status: AC
  Administered 2016-07-06: 840 mg via INTRAVENOUS
  Filled 2016-07-06: qty 40

## 2016-07-06 MED ORDER — SODIUM CHLORIDE 0.9% FLUSH
10.0000 mL | INTRAVENOUS | Status: DC | PRN
  Administered 2016-07-06: 10 mL
  Filled 2016-07-06: qty 10

## 2016-07-06 MED ORDER — SODIUM CHLORIDE 0.9 % IV SOLN: Freq: Once | INTRAVENOUS | Status: DC

## 2016-07-06 MED ORDER — SODIUM CHLORIDE 0.9% FLUSH
10.0000 mL | INTRAVENOUS | Status: DC | PRN
  Filled 2016-07-06: qty 10

## 2016-07-06 MED ORDER — ACETAMINOPHEN 325 MG PO TABS
ORAL_TABLET | ORAL | Status: AC
  Filled 2016-07-06: qty 2

## 2016-07-06 MED ORDER — PALONOSETRON HCL INJECTION 0.25 MG/5ML
INTRAVENOUS | Status: AC
  Filled 2016-07-06: qty 5

## 2016-07-06 NOTE — Patient Instructions (Addendum)
[facility] Discharge Instructions for Patients Receiving Chemotherapy  Today you received the following chemotherapy agents Herceptin, Perjeta, Taxotere, Carboplatin, Neulasta  To help prevent nausea and vomiting after your treatment, we encourage you to take your nausea medication as directed.  For 3 days after treatment take Compazine for nausea and thereafter take either Compazine or Zofran.  Take Claritin for 5 - 7 days.   If you develop nausea and vomiting that is not controlled by your nausea medication, call the clinic.   BELOW ARE SYMPTOMS THAT SHOULD BE REPORTED IMMEDIATELY:  *FEVER GREATER THAN 100.5 F  *CHILLS WITH OR WITHOUT FEVER  NAUSEA AND VOMITING THAT IS NOT CONTROLLED WITH YOUR NAUSEA MEDICATION  *UNUSUAL SHORTNESS OF BREATH  *UNUSUAL BRUISING OR BLEEDING  TENDERNESS IN MOUTH AND THROAT WITH OR WITHOUT PRESENCE OF ULCERS  *URINARY PROBLEMS  *BOWEL PROBLEMS  UNUSUAL RASH Items with * indicate a potential emergency and should be followed up as soon as possible.  Feel free to call the clinic you have any questions or concerns. The clinic phone number is [phone].  Please show the [facility] at check-in to the Emergency Department and triage nurse.  Trastuzumab injection for infusion What is this medicine? TRASTUZUMAB (tras TOO zoo mab) is a monoclonal antibody. It is used to treat breast cancer and stomach cancer. This medicine may be used for other purposes; ask your health care provider or pharmacist if you have questions. What should I tell my health care provider before I take this medicine? They need to know if you have any of these conditions: -heart disease -heart failure -infection (especially a virus infection such as chickenpox, cold sores, or herpes) -lung or breathing disease, like asthma -recent or ongoing radiation therapy -an unusual or allergic reaction to trastuzumab, benzyl alcohol, or other medications, foods,  dyes, or preservatives -pregnant or trying to get pregnant -breast-feeding How should I use this medicine? This drug is given as an infusion into a vein. It is administered in a hospital or clinic by a specially trained health care professional. Talk to your pediatrician regarding the use of this medicine in children. This medicine is not approved for use in children. Overdosage: If you think you have taken too much of this medicine contact a poison control center or emergency room at once. NOTE: This medicine is only for you. Do not share this medicine with others. What if I miss a dose? It is important not to miss a dose. Call your doctor or health care professional if you are unable to keep an appointment. What may interact with this medicine? -doxorubicin -warfarin This list may not describe all possible interactions. Give your health care provider a list of all the medicines, herbs, non-prescription drugs, or dietary supplements you use. Also tell them if you smoke, drink alcohol, or use illegal drugs. Some items may interact with your medicine. What should I watch for while using this medicine? Visit your doctor for checks on your progress. Report any side effects. Continue your course of treatment even though you feel ill unless your doctor tells you to stop. Call your doctor or health care professional for advice if you get a fever, chills or sore throat, or other symptoms of a cold or flu. Do not treat yourself. Try to avoid being around people who are sick. You may experience fever, chills and shaking during your first infusion. These effects are usually mild and can be treated with other medicines. Report any side  effects during the infusion to your health care professional. Fever and chills usually do not happen with later infusions. Do not become pregnant while taking this medicine or for 7 months after stopping it. Women should inform their doctor if they wish to become pregnant or  think they might be pregnant. Women of child-bearing potential will need to have a negative pregnancy test before starting this medicine. There is a potential for serious side effects to an unborn child. Talk to your health care professional or pharmacist for more information. Do not breast-feed an infant while taking this medicine or for 7 months after stopping it. Women must use effective birth control with this medicine. What side effects may I notice from receiving this medicine? Side effects that you should report to your doctor or other health care professional as soon as possible: -breathing difficulties -chest pain or palpitations -cough -dizziness or fainting -fever or chills, sore throat -skin rash, itching or hives -swelling of the legs or ankles -unusually weak or tired Side effects that usually do not require medical attention (report to your doctor or other health care professional if they continue or are bothersome): -loss of appetite -headache -muscle aches -nausea This list may not describe all possible side effects. Call your doctor for medical advice about side effects. You may report side effects to FDA at 1-800-FDA-1088. Where should I keep my medicine? This drug is given in a hospital or clinic and will not be stored at home. NOTE: This sheet is a summary. It may not cover all possible information. If you have questions about this medicine, talk to your doctor, pharmacist, or health care provider.    2016, [employer] Standard. (2015-02-22 11:49:32)  Pertuzumab injection What is this medicine? PERTUZUMAB (per TOOZ ue mab) is a monoclonal antibody. It is used to treat breast cancer. This medicine may be used for other purposes; ask your health care provider or pharmacist if you have questions. What should I tell my health care provider before I take this medicine? They need to know if you have any of these conditions: -heart disease -heart failure -high blood  pressure -history of irregular heart beat -recent or ongoing radiation therapy -an unusual or allergic reaction to pertuzumab, other medicines, foods, dyes, or preservatives -pregnant or trying to get pregnant -breast-feeding How should I use this medicine? This medicine is for infusion into a vein. It is given by a health care professional in a hospital or clinic setting. Talk to your pediatrician regarding the use of this medicine in children. Special care may be needed. Overdosage: If you think you have taken too much of this medicine contact a poison control center or emergency room at once. NOTE: This medicine is only for you. Do not share this medicine with others. What if I miss a dose? It is important not to miss your dose. Call your doctor or health care professional if you are unable to keep an appointment. What may interact with this medicine? Interactions are not expected. Give your health care provider a list of all the medicines, herbs, non-prescription drugs, or dietary supplements you use. Also tell them if you smoke, drink alcohol, or use illegal drugs. Some items may interact with your medicine. This list may not describe all possible interactions. Give your health care provider a list of all the medicines, herbs, non-prescription drugs, or dietary supplements you use. Also tell them if you smoke, drink alcohol, or use illegal drugs. Some items may interact with your medicine. What  should I watch for while using this medicine? Your condition will be monitored carefully while you are receiving this medicine. Report any side effects. Continue your course of treatment even though you feel ill unless your doctor tells you to stop. Do not become pregnant while taking this medicine or for 7 months after stopping it. Women should inform their doctor if they wish to become pregnant or think they might be pregnant. Women of child-bearing potential will need to have a negative pregnancy  test before starting this medicine. There is a potential for serious side effects to an unborn child. Talk to your health care professional or pharmacist for more information. Do not breast-feed an infant while taking this medicine or for 7 months after stopping it. Women must use effective birth control with this medicine. Call your doctor or health care professional for advice if you get a fever, chills or sore throat, or other symptoms of a cold or flu. Do not treat yourself. Try to avoid being around people who are sick. You may experience fever, chills, and headache during the infusion. Report any side effects during the infusion to your health care professional. What side effects may I notice from receiving this medicine? Side effects that you should report to your doctor or health care professional as soon as possible: -breathing problems -chest pain or palpitations -dizziness -feeling faint or lightheaded -fever or chills -skin rash, itching or hives -sore throat -swelling of the face, lips, or tongue -swelling of the legs or ankles -unusually weak or tired Side effects that usually do not require medical attention (Report these to your doctor or health care professional if they continue or are bothersome.): -diarrhea -hair loss -nausea, vomiting -tiredness This list may not describe all possible side effects. Call your doctor for medical advice about side effects. You may report side effects to FDA at 1-800-FDA-1088. Where should I keep my medicine? This drug is given in a hospital or clinic and will not be stored at home. NOTE: This sheet is a summary. It may not cover all possible information. If you have questions about this medicine, talk to your doctor, pharmacist, or health care provider.    2016, [employer] Standard. (2015-02-22 16:07:57)  Docetaxel injection What is this medicine? DOCETAXEL (doe se TAX el) is a chemotherapy drug. It targets fast dividing cells, like  cancer cells, and causes these cells to die. This medicine is used to treat many types of cancers like breast cancer, certain stomach cancers, head and neck cancer, lung cancer, and prostate cancer. This medicine may be used for other purposes; ask your health care provider or pharmacist if you have questions. What should I tell my health care provider before I take this medicine? They need to know if you have any of these conditions: -infection (especially a virus infection such as chickenpox, cold sores, or herpes) -liver disease -low blood counts, like low white cell, platelet, or red cell counts -an unusual or allergic reaction to docetaxel, polysorbate 80, other chemotherapy agents, other medicines, foods, dyes, or preservatives -pregnant or trying to get pregnant -breast-feeding How should I use this medicine? This drug is given as an infusion into a vein. It is administered in a hospital or clinic by a specially trained health care professional. Talk to your pediatrician regarding the use of this medicine in children. Special care may be needed. Overdosage: If you think you have taken too much of this medicine contact a poison control center or emergency room at once.  NOTE: This medicine is only for you. Do not share this medicine with others. What if I miss a dose? It is important not to miss your dose. Call your doctor or health care professional if you are unable to keep an appointment. What may interact with this medicine? -cyclosporine -erythromycin -ketoconazole -medicines to increase blood counts like filgrastim, pegfilgrastim, sargramostim -vaccines Talk to your doctor or health care professional before taking any of these medicines: -acetaminophen -aspirin -ibuprofen -ketoprofen -naproxen This list may not describe all possible interactions. Give your health care provider a list of all the medicines, herbs, non-prescription drugs, or dietary supplements you use. Also tell  them if you smoke, drink alcohol, or use illegal drugs. Some items may interact with your medicine. What should I watch for while using this medicine? Your condition will be monitored carefully while you are receiving this medicine. You will need important blood work done while you are taking this medicine. This drug may make you feel generally unwell. This is not uncommon, as chemotherapy can affect healthy cells as well as cancer cells. Report any side effects. Continue your course of treatment even though you feel ill unless your doctor tells you to stop. In some cases, you may be given additional medicines to help with side effects. Follow all directions for their use. Call your doctor or health care professional for advice if you get a fever, chills or sore throat, or other symptoms of a cold or flu. Do not treat yourself. This drug decreases your body's ability to fight infections. Try to avoid being around people who are sick. This medicine may increase your risk to bruise or bleed. Call your doctor or health care professional if you notice any unusual bleeding. This medicine may contain alcohol in the product. You may get drowsy or dizzy. Do not drive, use machinery, or do anything that needs mental alertness until you know how this medicine affects you. Do not stand or sit up quickly, especially if you are an older patient. This reduces the risk of dizzy or fainting spells. Avoid alcoholic drinks. Do not become pregnant while taking this medicine. Women should inform their doctor if they wish to become pregnant or think they might be pregnant. There is a potential for serious side effects to an unborn child. Talk to your health care professional or pharmacist for more information. Do not breast-feed an infant while taking this medicine. What side effects may I notice from receiving this medicine? Side effects that you should report to your doctor or health care professional as soon as  possible: -allergic reactions like skin rash, itching or hives, swelling of the face, lips, or tongue -low blood counts - This drug may decrease the number of white blood cells, red blood cells and platelets. You may be at increased risk for infections and bleeding. -signs of infection - fever or chills, cough, sore throat, pain or difficulty passing urine -signs of decreased platelets or bleeding - bruising, pinpoint red spots on the skin, black, tarry stools, nosebleeds -signs of decreased red blood cells - unusually weak or tired, fainting spells, lightheadedness -breathing problems -fast or irregular heartbeat -low blood pressure -mouth sores -nausea and vomiting -pain, swelling, redness or irritation at the injection site -pain, tingling, numbness in the hands or feet -swelling of the ankle, feet, hands -weight gain Side effects that usually do not require medical attention (report to your prescriber or health care professional if they continue or are bothersome): -bone pain -complete hair loss  including hair on your head, underarms, pubic hair, eyebrows, and eyelashes -diarrhea -excessive tearing -changes in the color of fingernails -loosening of the fingernails -nausea -muscle pain -red flush to skin -sweating -weak or tired This list may not describe all possible side effects. Call your doctor for medical advice about side effects. You may report side effects to FDA at 1-800-FDA-1088. Where should I keep my medicine? This drug is given in a hospital or clinic and will not be stored at home. NOTE: This sheet is a summary. It may not cover all possible information. If you have questions about this medicine, talk to your doctor, pharmacist, or health care provider.    2016, [employer] Standard. (2014-12-03 16:04:57)  Carboplatin injection What is this medicine? CARBOPLATIN (KAR boe pla tin) is a chemotherapy drug. It targets fast dividing cells, like cancer cells, and  causes these cells to die. This medicine is used to treat ovarian cancer and many other cancers. This medicine may be used for other purposes; ask your health care provider or pharmacist if you have questions. What should I tell my health care provider before I take this medicine? They need to know if you have any of these conditions: -blood disorders -hearing problems -kidney disease -recent or ongoing radiation therapy -an unusual or allergic reaction to carboplatin, cisplatin, other chemotherapy, other medicines, foods, dyes, or preservatives -pregnant or trying to get pregnant -breast-feeding How should I use this medicine? This drug is usually given as an infusion into a vein. It is administered in a hospital or clinic by a specially trained health care professional. Talk to your pediatrician regarding the use of this medicine in children. Special care may be needed. Overdosage: If you think you have taken too much of this medicine contact a poison control center or emergency room at once. NOTE: This medicine is only for you. Do not share this medicine with others. What if I miss a dose? It is important not to miss a dose. Call your doctor or health care professional if you are unable to keep an appointment. What may interact with this medicine? -medicines for seizures -medicines to increase blood counts like filgrastim, pegfilgrastim, sargramostim -some antibiotics like amikacin, gentamicin, neomycin, streptomycin, tobramycin -vaccines Talk to your doctor or health care professional before taking any of these medicines: -acetaminophen -aspirin -ibuprofen -ketoprofen -naproxen This list may not describe all possible interactions. Give your health care provider a list of all the medicines, herbs, non-prescription drugs, or dietary supplements you use. Also tell them if you smoke, drink alcohol, or use illegal drugs. Some items may interact with your medicine. What should I watch for  while using this medicine? Your condition will be monitored carefully while you are receiving this medicine. You will need important blood work done while you are taking this medicine. This drug may make you feel generally unwell. This is not uncommon, as chemotherapy can affect healthy cells as well as cancer cells. Report any side effects. Continue your course of treatment even though you feel ill unless your doctor tells you to stop. In some cases, you may be given additional medicines to help with side effects. Follow all directions for their use. Call your doctor or health care professional for advice if you get a fever, chills or sore throat, or other symptoms of a cold or flu. Do not treat yourself. This drug decreases your body's ability to fight infections. Try to avoid being around people who are sick. This medicine may increase your risk  to bruise or bleed. Call your doctor or health care professional if you notice any unusual bleeding. Be careful brushing and flossing your teeth or using a toothpick because you may get an infection or bleed more easily. If you have any dental work done, tell your dentist you are receiving this medicine. Avoid taking products that contain aspirin, acetaminophen, ibuprofen, naproxen, or ketoprofen unless instructed by your doctor. These medicines may hide a fever. Do not become pregnant while taking this medicine. Women should inform their doctor if they wish to become pregnant or think they might be pregnant. There is a potential for serious side effects to an unborn child. Talk to your health care professional or pharmacist for more information. Do not breast-feed an infant while taking this medicine. What side effects may I notice from receiving this medicine? Side effects that you should report to your doctor or health care professional as soon as possible: -allergic reactions like skin rash, itching or hives, swelling of the face, lips, or tongue -signs of  infection - fever or chills, cough, sore throat, pain or difficulty passing urine -signs of decreased platelets or bleeding - bruising, pinpoint red spots on the skin, black, tarry stools, nosebleeds -signs of decreased red blood cells - unusually weak or tired, fainting spells, lightheadedness -breathing problems -changes in hearing -changes in vision -chest pain -high blood pressure -low blood counts - This drug may decrease the number of white blood cells, red blood cells and platelets. You may be at increased risk for infections and bleeding. -nausea and vomiting -pain, swelling, redness or irritation at the injection site -pain, tingling, numbness in the hands or feet -problems with balance, talking, walking -trouble passing urine or change in the amount of urine Side effects that usually do not require medical attention (report to your doctor or health care professional if they continue or are bothersome): -hair loss -loss of appetite -metallic taste in the mouth or changes in taste This list may not describe all possible side effects. Call your doctor for medical advice about side effects. You may report side effects to FDA at 1-800-FDA-1088. Where should I keep my medicine? This drug is given in a hospital or clinic and will not be stored at home. NOTE: This sheet is a summary. It may not cover all possible information. If you have questions about this medicine, talk to your doctor, pharmacist, or health care provider.    2016, [employer] Standard. (2008-02-21 14:38:05)  Pegfilgrastim injection What is this medicine? PEGFILGRASTIM (PEG fil gra stim) is a long-acting granulocyte colony-stimulating factor that stimulates the growth of neutrophils, a type of white blood cell important in the body's fight against infection. It is used to reduce the incidence of fever and infection in patients with certain types of cancer who are receiving chemotherapy that affects the bone marrow,  and to increase survival after being exposed to high doses of radiation. This medicine may be used for other purposes; ask your health care provider or pharmacist if you have questions. What should I tell my health care provider before I take this medicine? They need to know if you have any of these conditions: -kidney disease -latex allergy -ongoing radiation therapy -sickle cell disease -skin reactions to acrylic adhesives (On-Body Injector only) -an unusual or allergic reaction to pegfilgrastim, filgrastim, other medicines, foods, dyes, or preservatives -pregnant or trying to get pregnant -breast-feeding How should I use this medicine? This medicine is for injection under the skin. If you get this medicine  at home, you will be taught how to prepare and give the pre-filled syringe or how to use the On-body Injector. Refer to the patient Instructions for Use for detailed instructions. Use exactly as directed. Take your medicine at regular intervals. Do not take your medicine more often than directed. It is important that you put your used needles and syringes in a special sharps container. Do not put them in a trash can. If you do not have a sharps container, call your pharmacist or healthcare provider to get one. Talk to your pediatrician regarding the use of this medicine in children. While this drug may be prescribed for selected conditions, precautions do apply. Overdosage: If you think you have taken too much of this medicine contact a poison control center or emergency room at once. NOTE: This medicine is only for you. Do not share this medicine with others. What if I miss a dose? It is important not to miss your dose. Call your doctor or health care professional if you miss your dose. If you miss a dose due to an On-body Injector failure or leakage, a new dose should be administered as soon as possible using a single prefilled syringe for manual use. What may interact with this  medicine? Interactions have not been studied. Give your health care provider a list of all the medicines, herbs, non-prescription drugs, or dietary supplements you use. Also tell them if you smoke, drink alcohol, or use illegal drugs. Some items may interact with your medicine. This list may not describe all possible interactions. Give your health care provider a list of all the medicines, herbs, non-prescription drugs, or dietary supplements you use. Also tell them if you smoke, drink alcohol, or use illegal drugs. Some items may interact with your medicine. What should I watch for while using this medicine? You may need blood work done while you are taking this medicine. If you are going to need a MRI, CT scan, or other procedure, tell your doctor that you are using this medicine (On-Body Injector only). What side effects may I notice from receiving this medicine? Side effects that you should report to your doctor or health care professional as soon as possible: -allergic reactions like skin rash, itching or hives, swelling of the face, lips, or tongue -dizziness -fever -pain, redness, or irritation at site where injected -pinpoint red spots on the skin -red or dark-brown urine -shortness of breath or breathing problems -stomach or side pain, or pain at the shoulder -swelling -tiredness -trouble passing urine or change in the amount of urine Side effects that usually do not require medical attention (report to your doctor or health care professional if they continue or are bothersome): -bone pain -muscle pain This list may not describe all possible side effects. Call your doctor for medical advice about side effects. You may report side effects to FDA at 1-800-FDA-1088. Where should I keep my medicine? Keep out of the reach of children. Store pre-filled syringes in a refrigerator between 2 and 8 degrees C (36 and 46 degrees F). Do not freeze. Keep in carton to protect from light. Throw  away this medicine if it is left out of the refrigerator for more than 48 hours. Throw away any unused medicine after the expiration date. NOTE: This sheet is a summary. It may not cover all possible information. If you have questions about this medicine, talk to your doctor, pharmacist, or health care provider.    2016, [employer]. (2014-12-06 14:30:14)

## 2016-07-06 NOTE — Patient Instructions (Signed)

## 2016-07-07 ENCOUNTER — Telehealth: Payer: Self-pay | Admitting: *Deleted

## 2016-07-07 NOTE — Telephone Encounter (Signed)
Left message for chemo follow up. To call us if having any problems or questions.

## 2016-07-07 NOTE — Telephone Encounter (Signed)
-----   Message from Egbert Garibaldi, RN sent at 07/06/2016  4:50 PM EDT ----- Regarding: Dr. Sunday Corn f/u call  Pt of dr. Burr Medico. First time Herceptin, Perjeta, taxotere and Carboplatin. Pt tolerated treatment well.

## 2016-07-07 NOTE — Telephone Encounter (Signed)
No new Note.

## 2016-07-09 ENCOUNTER — Ambulatory Visit: Payer: Medicare PPO | Admitting: Hematology

## 2016-07-09 ENCOUNTER — Encounter: Payer: Self-pay | Admitting: Hematology

## 2016-07-09 ENCOUNTER — Other Ambulatory Visit: Payer: Medicare PPO

## 2016-07-14 ENCOUNTER — Ambulatory Visit (HOSPITAL_BASED_OUTPATIENT_CLINIC_OR_DEPARTMENT_OTHER): Payer: Medicare PPO | Admitting: Hematology

## 2016-07-14 ENCOUNTER — Other Ambulatory Visit (HOSPITAL_BASED_OUTPATIENT_CLINIC_OR_DEPARTMENT_OTHER): Payer: Medicare PPO

## 2016-07-14 ENCOUNTER — Telehealth: Payer: Self-pay | Admitting: Hematology

## 2016-07-14 ENCOUNTER — Encounter: Payer: Self-pay | Admitting: Hematology

## 2016-07-14 VITALS — BP 142/57 | HR 112 | Temp 98.6°F | Resp 18 | Ht 69.0 in | Wt 224.9 lb

## 2016-07-14 DIAGNOSIS — Z853 Personal history of malignant neoplasm of breast: Secondary | ICD-10-CM

## 2016-07-14 DIAGNOSIS — Z170 Estrogen receptor positive status [ER+]: Secondary | ICD-10-CM | POA: Diagnosis not present

## 2016-07-14 DIAGNOSIS — C50112 Malignant neoplasm of central portion of left female breast: Secondary | ICD-10-CM | POA: Diagnosis not present

## 2016-07-14 DIAGNOSIS — I10 Essential (primary) hypertension: Secondary | ICD-10-CM | POA: Diagnosis not present

## 2016-07-14 DIAGNOSIS — E119 Type 2 diabetes mellitus without complications: Secondary | ICD-10-CM

## 2016-07-14 LAB — CBC WITH DIFFERENTIAL/PLATELET
BASO%: 0.2 % (ref 0.0–2.0)
Basophils Absolute: 0 10*3/uL (ref 0.0–0.1)
EOS%: 0.2 % (ref 0.0–7.0)
Eosinophils Absolute: 0 10*3/uL (ref 0.0–0.5)
HCT: 38.5 % (ref 34.8–46.6)
HGB: 12.6 g/dL (ref 11.6–15.9)
LYMPH%: 15.3 % (ref 14.0–49.7)
MCH: 31.7 pg (ref 25.1–34.0)
MCHC: 32.6 g/dL (ref 31.5–36.0)
MCV: 97.1 fL (ref 79.5–101.0)
MONO#: 1.4 10*3/uL — ABNORMAL HIGH (ref 0.1–0.9)
MONO%: 6.5 % (ref 0.0–14.0)
NEUT#: 17.3 10*3/uL — ABNORMAL HIGH (ref 1.5–6.5)
NEUT%: 77.8 % — ABNORMAL HIGH (ref 38.4–76.8)
Platelets: 267 10*3/uL (ref 145–400)
RBC: 3.97 10*6/uL (ref 3.70–5.45)
RDW: 13.1 % (ref 11.2–14.5)
WBC: 22.2 10*3/uL — ABNORMAL HIGH (ref 3.9–10.3)
lymph#: 3.4 10*3/uL — ABNORMAL HIGH (ref 0.9–3.3)

## 2016-07-14 LAB — COMPREHENSIVE METABOLIC PANEL
ALT: 78 U/L — ABNORMAL HIGH (ref 0–55)
AST: 56 U/L — ABNORMAL HIGH (ref 5–34)
Albumin: 3.5 g/dL (ref 3.5–5.0)
Alkaline Phosphatase: 80 U/L (ref 40–150)
Anion Gap: 11 mEq/L (ref 3–11)
BUN: 19 mg/dL (ref 7.0–26.0)
CO2: 21 mEq/L — ABNORMAL LOW (ref 22–29)
Calcium: 9.4 mg/dL (ref 8.4–10.4)
Chloride: 104 mEq/L (ref 98–109)
Creatinine: 1 mg/dL (ref 0.6–1.1)
EGFR: 72 mL/min/{1.73_m2} — ABNORMAL LOW (ref >90)
Glucose: 213 mg/dl — ABNORMAL HIGH (ref 70–140)
Potassium: 4.1 mEq/L (ref 3.5–5.1)
Sodium: 135 mEq/L — ABNORMAL LOW (ref 136–145)
Total Bilirubin: <0.3 mg/dL (ref 0.20–1.20)
Total Protein: 7.9 g/dL (ref 6.4–8.3)

## 2016-07-14 NOTE — Telephone Encounter (Signed)
GAVE PATIENT AVS REPORT AND APPOINTMENTS FOR AUGUST THRU October.  °

## 2016-07-14 NOTE — Progress Notes (Signed)
[place]  [phone] [phone]  Clinic Follow Up Note   Patient Care Team: Lindsey Fenton, NP as PCP - General (Internal Medicine) Lindsey Mesa, MD as Consulting Physician (General Surgery) 07/14/2016   CHIEF COMPLAINTS:  Follow up recurrent left breast cancer   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 [facility])   Staging form: Breast, AJCC 7th Edition     Clinical stage from 04/17/2016: Stage IA (T1c, N0, M0) - Signed by Lindsey Merle, MD on 05/07/2016     Pathologic stage from 05/28/2016: Stage IIA (T2, N0, cM0) - Signed by Lindsey Merle, MD on 06/11/2016 History of left breast cancer   Staging form: Breast, AJCC 7th Edition     Clinical: Stage IIIB (T4d, N1, cM0) - Signed by Lindsey Lark, MD on 12/14/2013     Pathologic: Stage IIIB (T4d, N1a, cM0) - Signed by Lindsey Lark, MD on 12/14/2013       History of left breast cancer   09/12/2002 Imaging    CT scan show no evidence of disease apart from the left axilla     09/2002 -  Neo-Adjuvant Chemotherapy    TAC X4 cycles      01/10/2003 Surgery    The patient had left lumpectomy and axillary lymph node dissection which show residual breast cancer and a sentinel lymph node was positive     2004 -  Adjuvant Chemotherapy    Cytoxan with 5-FU x4 cycles (methotrexate added at cycle 4).     2004 -  Radiation Therapy    Adjuvant left breast radiation     12/2002 Receptors her2    ER, PR and HER-2 were all negative.      Cancer of central portion of left breast ([place])   04/13/2016 Mammogram    Scheduler coarse heterogeneous calcifications spanning an area of 3.7 cm in the lumpectomy site, indeterminate. Ultrasound of the left axilla was negative.     04/17/2016 Initial Diagnosis    Cancer of central portion of left breast ([place])     04/17/2016 Initial Biopsy    Left breast core needle biopsy showed invasive and in situ ductal carcinoma with calcification, grade 2.     04/17/2016 Receptors  her2    Your 100% positive, PR 15% positive, strong staining, Ki-67 10%, HER-2 positive by IHC (3)     05/05/2016 Imaging    Bilateral breast MRI showed a 1.3 x 0.8 x 0.7 cm biopsy-proven ductal carcinoma in the region of the previous lumpectomy. Enlarged lobulated right inferior axillary lymph node with diffuse cortical thickening, biopsy is recommended.     05/15/2016 Pathology Results    right axilary node biopsy was negative for malignant cell      05/28/2016 Surgery    Simple left mastectomy     05/28/2016 Pathology Results    Left mastectomy showed invasive ductal carcinoma, grade 3, 3.6 cm, high grade DCIS, (+) LVI, surgical margins were negative. Tumor 3.6 cm, no lymph nodes identified.     06/25/2016 Imaging    Staging CT scan of the chest, abdomen and pelvis with contrast and a bone scan showed no evidence of metastasis. Postoperative fluid collection in the left breast, likely a seroma or hematoma. Right axillary adenopathy, which was biopsied previously.      06/26/2016 -  Chemotherapy    Adjuvant chemotherapy with docetaxel, carboplatin, Herceptin and pejeta every 3 weeks, for 6 cycles, followed by Herceptin maintenance therapy for total of  one year treatment        HISTORY OF PRESENTING ILLNESS:  Lindsey Marquez 65 y.o. female is here because of her recurrent left breast cancer.  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 She was seen by multiple medical oncologist in our practice in the past, last was seen by Dr. Alvy Marquez in 11/2013.  She has been doing well, and is compliant with annual screening mammogram. The screening mammogram on 04/17/2016 showed calcification in the left breast, and core needle biopsy showed invasive and in situ ductal carcinoma, grade 2, ER/PR positive, HER-2 amplified. She was referred to seeing breast surgeon Dr. Georgette Marquez  and referred back to [place].   She feels well, she did not feel any lump in her breast or axillas latley. She denies any pain, dyspnea, or GI symptoms. She had right nipple rash and right axillary swelling in the past one week, after she drinks some diet tea with citric. Breast MRI showed an enlarged lobulated right inferior axillary lymph node with diffuse cortical thickening, in addition to the known left breast mass which measured 1.3 cm on MRI.  GYN HISTORY  Menarchal: 12 LMP: 2003 Contraceptive: 20 HRT: n/a  G2P2: 93 yo Son and 67 yo son who died   CURRENT THERAPY: adjuvant chemotherapy TCHP every 3 weeks, started on 07/07/2016  INTERIM HISTORY: Lindsey Marquez returns for a toxicity check up after her first cycle chemotherapy. She tolerated the treatment well overall. She did have mild-to-moderate fatigue afterwards, and mild diarrhea with loose bowel movement twice a day, she did not take any Imodium. Appetite has dropped some, she is eating well overall. She did lost 6 pounds in the past 2 weeks, and she was losing weight even before the chemotherapy. She states she has been eating well. No fever or chills, mildbleeding, no other signs of bleeding. She otherwise feels well overall. She drinks fluids adequately.  MEDICAL HISTORY:  Past Medical History:  Diagnosis Date  . Anxiety   . Breast cancer [facility]) August 2002   Invasive ductal carcinoma Left breast.  . Diabetes mellitus without complication ([place])   . Family history of malignant neoplasm of ovary   . Family history of pancreatic cancer   . Hypertension     SURGICAL HISTORY: Past Surgical History:  Procedure Laterality Date  . BREAST SURGERY Left 2003  . FOOT SURGERY  2000  . MASTECTOMY Left 05/28/2016  . port a cath insertion  05/28/2016  . PORTACATH PLACEMENT Right 05/28/2016   Procedure: INSERTION PORT-A-CATH;  Surgeon: Lindsey Mesa, MD;  Location: [place];  Service: General;  Laterality: Right;  . TOTAL MASTECTOMY Left 05/28/2016    Procedure: LEFT  MASTECTOMY;  Surgeon: Lindsey Mesa, MD;  Location: [place];  Service: General;  Laterality: Left;  . TUBAL LIGATION  22 yrs since  2004.   Bilateral.    SOCIAL HISTORY: Social History   Social History  . Marital status: Divorced    Spouse name: N/A  . Number of children: N/A  . Years of education: N/A   Occupational History  . Not on file.   Social History Main Topics  . Smoking status: Former Smoker    Packs/day: 0.10    Years: 7.00    Quit date: 12/01/1975  . Smokeless tobacco: Never Used  . Alcohol use No  . Drug use: No  . Sexual activity: Yes   Other Topics  Concern  . Not on file   Social History Narrative  . No narrative on file   She is retired from school   FAMILY HISTORY: Family History  Problem Relation Age of Onset  . Prostate cancer Father   . Ovarian cancer Maternal Aunt 57    deceased  . Cancer Maternal Uncle     "bone ca"; unk. primary; deceased 70s  . Pancreatic cancer Mother 74    deceased  . Cancer Maternal Aunt     2 other mat aunts; unk. primary in 43s; deceased  . Prostate cancer Maternal Uncle     deceased 48    ALLERGIES:  is allergic to codeine.  MEDICATIONS:  Current Outpatient Prescriptions  Medication Sig Dispense Refill  . aspirin 81 MG tablet Take 81 mg by mouth daily.     . furosemide (LASIX) 20 MG tablet Take 0.5 tablets (10 mg total) by mouth daily as needed. 30 tablet 1  . glyBURIDE-metformin (GLUCOVANCE) 2.5-500 MG per tablet Take 1 tablet by mouth 2 (two) times daily with a meal.      . ibuprofen (ADVIL,MOTRIN) 200 MG tablet Take 200 mg by mouth every 8 (eight) hours as needed.      . lidocaine-prilocaine (EMLA) cream Apply to affected area once 30 g 3  . lisinopril-hydrochlorothiazide (PRINZIDE,ZESTORETIC) 20-25 MG per tablet Take 1 tablet by mouth daily.      . Omega-3 Fatty Acids (FISH OIL) 1200 MG CAPS Take 1 capsule by mouth daily.    . ondansetron (ZOFRAN) 8 MG tablet Take 1 tablet (8 mg total) by  mouth 2 (two) times daily as needed for refractory nausea / vomiting. Start on day 3 after chemo. 30 tablet 1  . prochlorperazine (COMPAZINE) 10 MG tablet Take 1 tablet (10 mg total) by mouth every 6 (six) hours as needed (Nausea or vomiting). 30 tablet 1  . traMADol (ULTRAM) 50 MG tablet Take 2 tablets (100 mg total) by mouth every 6 (six) hours as needed for moderate pain. 20 tablet 0  . dexamethasone (DECADRON) 4 MG tablet Take 1 tablet (4 mg total) by mouth 2 (two) times daily. Start the day before Taxotere. Then again the day after chemo for 3 days. (Patient not taking: Reported on 07/14/2016) 30 tablet 1  . Potassium 99 MG TABS Take 1 tablet by mouth as needed.      No current facility-administered medications for this visit.     REVIEW OF SYSTEMS:   Constitutional: Denies fevers, chills or abnormal night sweats Eyes: Denies blurriness of vision, double vision or watery eyes Ears, nose, mouth, throat, and face: Denies mucositis or sore throat Respiratory: Denies cough, dyspnea or wheezes Cardiovascular: Denies palpitation, chest discomfort or lower extremity swelling Gastrointestinal:  Denies nausea, heartburn or change in bowel habits Skin: Denies abnormal skin rashes Lymphatics: Denies new lymphadenopathy or easy bruising Neurological:Denies numbness, tingling or new weaknesses Behavioral/Psych: Mood is stable, no new changes  All other systems were reviewed with the patient and are negative.  PHYSICAL EXAMINATION: ECOG PERFORMANCE STATUS: 0  Vitals:   07/14/16 0848  BP: (!) 142/57  Pulse: (!) 112  Resp: 18  Temp: 98.6 F (37 C)   Filed Weights   07/14/16 0848  Weight: 224 lb 14.4 oz (102 kg)    GENERAL:alert, no distress and comfortable SKIN: skin color, texture, turgor are normal, no rashes or significant lesions EYES: normal, conjunctiva are pink and non-injected, sclera clear OROPHARYNX:no exudate, no erythema and lips, buccal mucosa, and  tongue normal  NECK:  supple, thyroid normal size, non-tender, without nodularity LYMPH:  no palpable lymphadenopathy in the cervical, axillary or inguinal LUNGS: clear to auscultation and percussion with normal breathing effort HEART: regular rate & rhythm and no murmurs and no lower extremity edema ABDOMEN:abdomen soft, non-tender and normal bowel sounds Musculoskeletal:no cyanosis of digits and no clubbing  PSYCH: alert & oriented x 3 with fluent speech NEURO: no focal motor/sensory deficits Breasts: Breast inspection showed left breast is surgically absent, surgical incision has healed well. Exam of the right breast and bilateral axillas reviewed no palpable mass or adenopathy.   LABORATORY DATA:  I have reviewed the data as listed CBC Latest Ref Rng & Units 07/14/2016 07/06/2016 06/26/2016  WBC 3.9 - 10.3 10e3/uL 22.2(H) 9.3 7.8  Hemoglobin 11.6 - 15.9 g/dL 12.6 12.8 12.9  Hematocrit 34.8 - 46.6 % 38.5 37.2 37.6  Platelets 145 - 400 10e3/uL 267 311 307   CMP Latest Ref Rng & Units 07/14/2016 07/06/2016 06/26/2016  Glucose 70 - 140 mg/dl 213(H) 275(H) 175(H)  BUN 7.0 - 26.0 mg/dL 19.0 15.6 13.0  Creatinine 0.6 - 1.1 mg/dL 1.0 0.8 0.9  Sodium 136 - 145 mEq/L 135(L) 138 139  Potassium 3.5 - 5.1 mEq/L 4.1 4.1 4.5  Chloride 101 - 111 mmol/L - - -  CO2 22 - 29 mEq/L 21(L) 22 28  Calcium 8.4 - 10.4 mg/dL 9.4 9.9 9.8  Total Protein 6.4 - 8.3 g/dL 7.9 8.3 8.3  Total Bilirubin 0.20 - 1.20 mg/dL <0.30 0.41 0.73  Alkaline Phos 40 - 150 U/L 80 72 67  AST 5 - 34 U/L 56(H) 29 50(H)  ALT 0 - 55 U/L 78(H) 39 54     PATHOLOGY REPORT  Diagnosis 04/17/2016 Breast, left, needle core biopsy, inner mid breast - INVASIVE DUCTAL CARCINOMA WITH CALCIFICATIONS, SEE COMMENT. - DUCTAL CARCINOMA IN SITU WITH COMEDONECROSIS. Microscopic Comment While grading is best performed on the resection specimen the invasive carcinoma appears grade 2. Prognostic markers will be ordered and reported in an addendum. Dr. Lyndon Code has reviewed the  case. The case was called to [facility] on 04/20/2016.  Results: HER2 - *EQUIVOCAL* (Her2 by IHC will be ordered.) RATIO OF HER2/CEP17 SIGNALS 1.73 AVERAGE HER2 COPY NUMBER PER CELL 4.15 By immunohistochemistry, the tumor cells are positive for Her2 (3).  Results: IMMUNOHISTOCHEMICAL AND MORPHOMETRIC ANALYSIS PERFORMED MANUALLY Estrogen Receptor: 100%, POSITIVE, STRONG STAINING INTENSITY Progesterone Receptor: 15%, POSITIVE, STRONG STAINING INTENSITY Proliferation Marker Ki67: 10%  Diagnosis 05/15/2016 Lymph node, needle/core biopsy, right axilla - BENIGN REACTIVE LYMPH NODE. - NO GRANULOMAS OR MALIGNANCY IDENTIFIED. - SEE COMMENT. Microscopic Comment Internal departmental review obtained (Dr. Lyndon Code) with agreement. Results are phoned to [facility] (05/18/16). (MEG:gt, 05/18/16)   Breast, simple mastectomy, Left 05/28/2016 - INVASIVE DUCTAL CARCINOMA, GRADE III/III, SPANNING 3.6 CM. - DUCTAL CARCINOMA IN SITU, HIGH GRADE. - LYMPHOVASCULAR INVASION IS IDENTIFIED. - THE SURGICAL RESECTION MARGINS ARE NEGATIVE FOR CARCINOMA. - SEE ONCOLOGY TABLE BELOW. Microscopic Comment BREAST, INVASIVE TUMOR, WITHOUT LYMPH NODES PRESENT Specimen, including laterality : Left breast Procedure: Simple mastectomy Histologic type: Ductal with micropapillary features Grade: III Tubule formation: 3 Nuclear pleomorphism: 2 Mitotic: 3 Tumor size (gross measurement): 3.6 cm Margins: Greater than 0.2 cm to all margins Lymphovascular invasion: Present Ductal carcinoma in situ: Present Grade: High grade Extensive intraductal component: Yes Lobular neoplasia: Not identified Tumor focality: Unifocal Treatment effect: N/A Extent of tumor: Confined to breast parenchyma. Breast prognostic profile: [id number]  Estrogen receptor: 100%, strong staining intensity Progesterone receptor: 15%, strong staining intensity Her 2 neu: Amplification was detected by  immunohistochemistry. Ki-67: 10% 1 of 2 FINAL for Lindsey Marquez, Lindsey Marquez ([id number]) Microscopic Comment(continued) Non-neoplastic breast: No significant findings. TNM: pT2, pNX (clinically recurrent). Comments: In addition to the main tumor, there is ductal carcinoma in situ present in random tissue submitted from the inferior lateral quadrant. (JBK:kh 06-01-16)   RADIOGRAPHIC STUDIES: I have personally reviewed the radiological images as listed and agreed with the findings in the report. Ct Chest W Contrast  Result Date: 06/25/2016 CLINICAL DATA:  Restaging of left breast cancer diagnosed in 2003 in 2017. Mastectomy earlier this year. Chemotherapy to start. EXAM: CT CHEST, ABDOMEN, AND PELVIS WITH CONTRAST TECHNIQUE: Multidetector CT imaging of the chest, abdomen and pelvis was performed following the standard protocol during bolus administration of intravenous contrast. CONTRAST:  157m ISOVUE-300 IOPAMIDOL (ISOVUE-300) INJECTION 61% COMPARISON:  Today's bone scan, dictated separately. Breast MR 05/05/2016. Chest radiograph 05/28/2016. Report of chest abdomen and pelvic CTs of 09/12/2002. FINDINGS: CT CHEST FINDINGS Mediastinum/Lymph Nodes: No supraclavicular adenopathy. A right Port-A-Cath terminates at the high right atrium. Right axillary nodes measure up to 1.0 short axis on image 20/series 7. Left axillary node dissection. No left axillary adenopathy. No subpectoral adenopathy. Left mastectomy a with presumed postoperative fluid collection in the chest wall. 14.7 x 3.5 cm. Aortic atherosclerosis. Normal heart size, without pericardial effusion. No central pulmonary embolism, on this non-dedicated study. No mediastinal or hilar adenopathy. No internal mammary adenopathy. Lungs/Pleura: No pleural fluid.  Clear lungs. Musculoskeletal: No acute osseous abnormality. CT ABDOMEN PELVIS FINDINGS Hepatobiliary: Moderate hepatic steatosis with hepatomegaly at 21.0 cm craniocaudal. No focal liver lesion.  Normal gallbladder, without biliary ductal dilatation. Pancreas: Normal, without mass or ductal dilatation. Spleen: Normal in size, without focal abnormality. Adrenals/Urinary Tract: Normal adrenal glands. Normal kidneys, without hydronephrosis. Normal urinary bladder. Stomach/Bowel: Normal stomach, without wall thickening. Normal colon, appendix, and terminal ileum. Normal small bowel. Vascular/Lymphatic: Aortic and branch vessel atherosclerosis. No abdominopelvic adenopathy. Reproductive: Central uterine hypoattenuation at 11 mm on image 103 sagittal. No adnexal mass. Other: No significant free fluid. Fat containing bilateral inguinal hernias are tiny. There is also a fat containing ventral abdominal wall hernia on image 85/series 2. Musculoskeletal: No suspicious osseous lesion. Degenerative disc disease is advanced,/at L4-5 and L5-S1. Convex right lumbar spine curvature. IMPRESSION: 1. Left mastectomy with postoperative fluid collection in the left chest wall. This most likely a postoperative seroma or hematoma. 2. Prominent right axillary nodes, as detailed on prior MRI and sampled on 05/15/2016. 3. Otherwise, no evidence of metastatic disease in the chest, abdomen, or pelvis. 4. Hepatic steatosis and hepatomegaly. 5.  Aortic atherosclerosis. 6. Fat containing ventral and bilateral inguinal hernias. 7. Central uterine hypoattenuation which is abnormal for patient age. Correlate with postmenopausal bleeding. Especially if there is any such symptoms, consider pelvic ultrasound. Electronically Signed   By: KAbigail MiyamotoM.D.   On: 06/25/2016 16:16  Nm Bone Scan Whole Body  Result Date: 06/25/2016 CLINICAL DATA:  History of breast carcinoma and left mastectomy, evaluate for metastatic disease EXAM: NUCLEAR MEDICINE WHOLE BODY BONE SCAN TECHNIQUE: Whole body anterior and posterior images were obtained approximately 3 hours after intravenous injection of radiopharmaceutical. RADIOPHARMACEUTICALS:  24.9 mCi  Technetium-998mDP IV COMPARISON:  Total body bone scan of 12/14/2003 FINDINGS: Compared to the study from 2005, very little interval change is seen. Again there is probable degenerative change in the lower lumbar spine, knees, and mid  feet. However no evidence of bone metastasis is is seen. IMPRESSION: No evidence of bone metastasis. Degenerative changes in the lower lumbar spine, knees, and mid feet as noted previously. Electronically Signed   By: Ivar Drape M.D.   On: 06/25/2016 14:20  Ct Abdomen Pelvis W Contrast  Result Date: 06/25/2016 CLINICAL DATA:  Restaging of left breast cancer diagnosed in 2003 in 2017. Mastectomy earlier this year. Chemotherapy to start. EXAM: CT CHEST, ABDOMEN, AND PELVIS WITH CONTRAST TECHNIQUE: Multidetector CT imaging of the chest, abdomen and pelvis was performed following the standard protocol during bolus administration of intravenous contrast. CONTRAST:  146m ISOVUE-300 IOPAMIDOL (ISOVUE-300) INJECTION 61% COMPARISON:  Today's bone scan, dictated separately. Breast MR 05/05/2016. Chest radiograph 05/28/2016. Report of chest abdomen and pelvic CTs of 09/12/2002. FINDINGS: CT CHEST FINDINGS Mediastinum/Lymph Nodes: No supraclavicular adenopathy. A right Port-A-Cath terminates at the high right atrium. Right axillary nodes measure up to 1.0 short axis on image 20/series 7. Left axillary node dissection. No left axillary adenopathy. No subpectoral adenopathy. Left mastectomy a with presumed postoperative fluid collection in the chest wall. 14.7 x 3.5 cm. Aortic atherosclerosis. Normal heart size, without pericardial effusion. No central pulmonary embolism, on this non-dedicated study. No mediastinal or hilar adenopathy. No internal mammary adenopathy. Lungs/Pleura: No pleural fluid.  Clear lungs. Musculoskeletal: No acute osseous abnormality. CT ABDOMEN PELVIS FINDINGS Hepatobiliary: Moderate hepatic steatosis with hepatomegaly at 21.0 cm craniocaudal. No focal liver lesion.  Normal gallbladder, without biliary ductal dilatation. Pancreas: Normal, without mass or ductal dilatation. Spleen: Normal in size, without focal abnormality. Adrenals/Urinary Tract: Normal adrenal glands. Normal kidneys, without hydronephrosis. Normal urinary bladder. Stomach/Bowel: Normal stomach, without wall thickening. Normal colon, appendix, and terminal ileum. Normal small bowel. Vascular/Lymphatic: Aortic and branch vessel atherosclerosis. No abdominopelvic adenopathy. Reproductive: Central uterine hypoattenuation at 11 mm on image 103 sagittal. No adnexal mass. Other: No significant free fluid. Fat containing bilateral inguinal hernias are tiny. There is also a fat containing ventral abdominal wall hernia on image 85/series 2. Musculoskeletal: No suspicious osseous lesion. Degenerative disc disease is advanced,/at L4-5 and L5-S1. Convex right lumbar spine curvature. IMPRESSION: 1. Left mastectomy with postoperative fluid collection in the left chest wall. This most likely a postoperative seroma or hematoma. 2. Prominent right axillary nodes, as detailed on prior MRI and sampled on 05/15/2016. 3. Otherwise, no evidence of metastatic disease in the chest, abdomen, or pelvis. 4. Hepatic steatosis and hepatomegaly. 5.  Aortic atherosclerosis. 6. Fat containing ventral and bilateral inguinal hernias. 7. Central uterine hypoattenuation which is abnormal for patient age. Correlate with postmenopausal bleeding. Especially if there is any such symptoms, consider pelvic ultrasound. Electronically Signed   By: KAbigail MiyamotoM.D.   On: 06/25/2016 16:16   ASSESSMENT & PLAN:  65year old African-American female, postmenopausal, history of left breast triple negative cancer in 2003, presented with mammogram discovered left breast cancer, triple positive.  1. Cancer of central portion of left breast, pT2NxM0, stage IIA, G3, triple positive -I reviewed her surgical pathology findings in great details with patient and  her family member. It showed a 3.6 cm mass, grade 3, surgical margins were negative. She had no sentinel lymph node biopsy due to her prior history of axillary lymph node dissection. -Her restaging CT scan of bone scan showed no evidence of distant metastasis, I reviewed with patient. -We discussed the risk of cancer recurrence of this complete surgical resection. We reviewed that HER-2 positive with cancers are more aggressive, with increased risk of metastasis.  -Given the  moderate to high risk of cancer recurrence, I recommend adjuvant chemotherapy to reduce her risk of cancer recurrence. I recommend chemotherapy and duo anti-HER-2 agents TCHP (docetaxel, carbotaxol, Herceptin, pejeta), every 3 weeks for total of 6 cycles, followed by maintenance Herceptin every 3 weeks to complete 1 year treatment. -She tolerated the first cycle chemotherapy well overall. -I encouraged her to use Imodium as needed for diarrhea and drink fluids adequately. -She is slightly tachycardic today, doesn't appear to be dehydrated. I encouraged her to drink more water at home. -Her blood counts are good today, leukocytosis is secondary to Neulasta. I reviewed neutropenia precautions and again with her.  2. DM and HTN  -She'll continue follow-up with her primary care physician -We discussed her blood pressure and glucose level may be impacted by chemotherapy, and dexamethasone she takes before and after chemotherapy. -I strongly encouraged her to monitor her blood pressure and glucose at home, and consider increasing the dose of metformin and glyburide if needed  Plan -Return to clinic in 2 weeks for second cycle chemotherapy and I will see her then   All questions were answered. The patient knows to call the clinic with any problems, questions or concerns.  I spent 20 minutes counseling the patient face to face. The total time spent in the appointment was 25 minutes and more than 50% was on counseling.     Lindsey Merle, MD 07/14/2016

## 2016-07-23 NOTE — Telephone Encounter (Signed)
Called patient to see if she wanted to reschedule colon.  Pt stated she is still having chemo treatments and will CB once the treatments are over.  Records placed in filing cabinet with the "records reviewed folder"

## 2016-07-27 ENCOUNTER — Ambulatory Visit: Payer: Medicare PPO

## 2016-07-27 ENCOUNTER — Ambulatory Visit: Payer: Medicare PPO | Admitting: Nutrition

## 2016-07-27 ENCOUNTER — Other Ambulatory Visit (HOSPITAL_BASED_OUTPATIENT_CLINIC_OR_DEPARTMENT_OTHER): Payer: Medicare PPO

## 2016-07-27 ENCOUNTER — Ambulatory Visit (HOSPITAL_BASED_OUTPATIENT_CLINIC_OR_DEPARTMENT_OTHER): Payer: Medicare PPO

## 2016-07-27 ENCOUNTER — Encounter: Payer: Self-pay | Admitting: Hematology

## 2016-07-27 ENCOUNTER — Encounter: Payer: Self-pay | Admitting: *Deleted

## 2016-07-27 ENCOUNTER — Ambulatory Visit (HOSPITAL_BASED_OUTPATIENT_CLINIC_OR_DEPARTMENT_OTHER): Payer: Medicare PPO | Admitting: Hematology

## 2016-07-27 VITALS — BP 148/56 | HR 90 | Temp 98.4°F | Resp 18 | Ht 69.0 in | Wt 229.1 lb

## 2016-07-27 DIAGNOSIS — Z170 Estrogen receptor positive status [ER+]: Secondary | ICD-10-CM | POA: Diagnosis not present

## 2016-07-27 DIAGNOSIS — C50912 Malignant neoplasm of unspecified site of left female breast: Secondary | ICD-10-CM

## 2016-07-27 DIAGNOSIS — I10 Essential (primary) hypertension: Secondary | ICD-10-CM

## 2016-07-27 DIAGNOSIS — Z5111 Encounter for antineoplastic chemotherapy: Secondary | ICD-10-CM | POA: Diagnosis not present

## 2016-07-27 DIAGNOSIS — Z5112 Encounter for antineoplastic immunotherapy: Secondary | ICD-10-CM

## 2016-07-27 DIAGNOSIS — C50112 Malignant neoplasm of central portion of left female breast: Secondary | ICD-10-CM | POA: Diagnosis not present

## 2016-07-27 DIAGNOSIS — E119 Type 2 diabetes mellitus without complications: Secondary | ICD-10-CM

## 2016-07-27 DIAGNOSIS — Z853 Personal history of malignant neoplasm of breast: Secondary | ICD-10-CM

## 2016-07-27 LAB — COMPREHENSIVE METABOLIC PANEL
ALT: 46 U/L (ref 0–55)
AST: 22 U/L (ref 5–34)
Albumin: 3.3 g/dL — ABNORMAL LOW (ref 3.5–5.0)
Alkaline Phosphatase: 59 U/L (ref 40–150)
Anion Gap: 13 mEq/L — ABNORMAL HIGH (ref 3–11)
BUN: 10.4 mg/dL (ref 7.0–26.0)
CO2: 28 mEq/L (ref 22–29)
Calcium: 9.4 mg/dL (ref 8.4–10.4)
Chloride: 100 mEq/L (ref 98–109)
Creatinine: 0.8 mg/dL (ref 0.6–1.1)
EGFR: 90 mL/min/{1.73_m2} — ABNORMAL LOW (ref >90)
Glucose: 166 mg/dl — ABNORMAL HIGH (ref 70–140)
Potassium: 3.1 mEq/L — ABNORMAL LOW (ref 3.5–5.1)
Sodium: 141 mEq/L (ref 136–145)
Total Bilirubin: 0.38 mg/dL (ref 0.20–1.20)
Total Protein: 7.3 g/dL (ref 6.4–8.3)

## 2016-07-27 LAB — CBC WITH DIFFERENTIAL/PLATELET
BASO%: 0.1 % (ref 0.0–2.0)
Basophils Absolute: 0 10*3/uL (ref 0.0–0.1)
EOS%: 0.1 % (ref 0.0–7.0)
Eosinophils Absolute: 0 10*3/uL (ref 0.0–0.5)
HCT: 31.7 % — ABNORMAL LOW (ref 34.8–46.6)
HGB: 10.9 g/dL — ABNORMAL LOW (ref 11.6–15.9)
LYMPH%: 12.5 % — ABNORMAL LOW (ref 14.0–49.7)
MCH: 32.1 pg (ref 25.1–34.0)
MCHC: 34.4 g/dL (ref 31.5–36.0)
MCV: 93.2 fL (ref 79.5–101.0)
MONO#: 0.9 10*3/uL (ref 0.1–0.9)
MONO%: 7.3 % (ref 0.0–14.0)
NEUT#: 10 10*3/uL — ABNORMAL HIGH (ref 1.5–6.5)
NEUT%: 80 % — ABNORMAL HIGH (ref 38.4–76.8)
Platelets: 272 10*3/uL (ref 145–400)
RBC: 3.4 10*6/uL — ABNORMAL LOW (ref 3.70–5.45)
RDW: 13.3 % (ref 11.2–14.5)
WBC: 12.5 10*3/uL — ABNORMAL HIGH (ref 3.9–10.3)
lymph#: 1.6 10*3/uL (ref 0.9–3.3)

## 2016-07-27 MED ORDER — SODIUM CHLORIDE 0.9% FLUSH
10.0000 mL | Freq: Once | INTRAVENOUS | Status: AC
  Administered 2016-07-27: 10 mL via INTRAVENOUS
  Filled 2016-07-27: qty 10

## 2016-07-27 MED ORDER — PALONOSETRON HCL INJECTION 0.25 MG/5ML
INTRAVENOUS | Status: AC
Start: 2016-07-27 — End: 2016-07-27
  Filled 2016-07-27: qty 5

## 2016-07-27 MED ORDER — TRASTUZUMAB CHEMO 150 MG IV SOLR
6.0000 mg/kg | Freq: Once | INTRAVENOUS | Status: AC
  Administered 2016-07-27: 630 mg via INTRAVENOUS
  Filled 2016-07-27: qty 30

## 2016-07-27 MED ORDER — PERTUZUMAB CHEMO INJECTION 420 MG/14ML
420.0000 mg | Freq: Once | INTRAVENOUS | Status: AC
  Administered 2016-07-27: 420 mg via INTRAVENOUS
  Filled 2016-07-27: qty 14

## 2016-07-27 MED ORDER — ACETAMINOPHEN 325 MG PO TABS
ORAL_TABLET | ORAL | Status: AC
  Filled 2016-07-27: qty 2

## 2016-07-27 MED ORDER — ACETAMINOPHEN 325 MG PO TABS
650.0000 mg | ORAL_TABLET | Freq: Once | ORAL | Status: AC
  Administered 2016-07-27: 650 mg via ORAL

## 2016-07-27 MED ORDER — SODIUM CHLORIDE 0.9 % IV SOLN
Freq: Once | INTRAVENOUS | Status: AC
  Administered 2016-07-27: 11:00:00 via INTRAVENOUS

## 2016-07-27 MED ORDER — SODIUM CHLORIDE 0.9% FLUSH
10.0000 mL | INTRAVENOUS | Status: DC | PRN
  Administered 2016-07-27: 10 mL
  Filled 2016-07-27: qty 10

## 2016-07-27 MED ORDER — SODIUM CHLORIDE 0.9 % IV SOLN
75.0000 mg/m2 | Freq: Once | INTRAVENOUS | Status: AC
  Administered 2016-07-27: 170 mg via INTRAVENOUS
  Filled 2016-07-27: qty 17

## 2016-07-27 MED ORDER — HEPARIN SOD (PORK) LOCK FLUSH 100 UNIT/ML IV SOLN
500.0000 [IU] | Freq: Once | INTRAVENOUS | Status: AC | PRN
  Administered 2016-07-27: 500 [IU]
  Filled 2016-07-27: qty 5

## 2016-07-27 MED ORDER — DIPHENHYDRAMINE HCL 25 MG PO CAPS
ORAL_CAPSULE | ORAL | Status: AC
  Filled 2016-07-27: qty 2

## 2016-07-27 MED ORDER — SODIUM CHLORIDE 0.9 % IV SOLN
10.0000 mg | Freq: Once | INTRAVENOUS | Status: AC
  Administered 2016-07-27: 10 mg via INTRAVENOUS
  Filled 2016-07-27: qty 1

## 2016-07-27 MED ORDER — SODIUM CHLORIDE 0.9 % IV SOLN
706.2000 mg | Freq: Once | INTRAVENOUS | Status: AC
  Administered 2016-07-27: 710 mg via INTRAVENOUS
  Filled 2016-07-27: qty 71

## 2016-07-27 MED ORDER — PALONOSETRON HCL INJECTION 0.25 MG/5ML
0.2500 mg | Freq: Once | INTRAVENOUS | Status: AC
  Administered 2016-07-27: 0.25 mg via INTRAVENOUS

## 2016-07-27 MED ORDER — DIPHENHYDRAMINE HCL 25 MG PO CAPS
50.0000 mg | ORAL_CAPSULE | Freq: Once | ORAL | Status: AC
  Administered 2016-07-27: 50 mg via ORAL

## 2016-07-27 NOTE — Progress Notes (Signed)
[place]  [phone] [phone]  Clinic Follow Up Note   Patient Care Team: [name], NP as PCP - General (Internal Medicine) [name], MD as Consulting Physician (General Surgery) 07/27/2016   CHIEF COMPLAINTS:  Follow up recurrent left breast cancer   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 [facility])   Staging form: Breast, AJCC 7th Edition     Clinical stage from 04/17/2016: Stage IA (T1c, N0, M0) - Signed by [name], MD on 05/07/2016     Pathologic stage from 05/28/2016: Stage IIA (T2, N0, cM0) - Signed by [name], MD on 06/11/2016 History of left breast cancer   Staging form: Breast, AJCC 7th Edition     Clinical: Stage IIIB (T4d, N1, cM0) - Signed by [name], MD on 12/14/2013     Pathologic: Stage IIIB (T4d, N1a, cM0) - Signed by [name], MD on 12/14/2013       History of left breast cancer   09/12/2002 Imaging    CT scan show no evidence of disease apart from the left axilla      09/2002 -  Neo-Adjuvant Chemotherapy    TAC X4 cycles       01/10/2003 Surgery    The patient had left lumpectomy and axillary lymph node dissection which show residual breast cancer and a sentinel lymph node was positive      2004 -  Adjuvant Chemotherapy    Cytoxan with 5-FU x4 cycles (methotrexate added at cycle 4).      2004 -  Radiation Therapy    Adjuvant left breast radiation      12/2002 Receptors her2    ER, PR and HER-2 were all negative.       Cancer of central portion of left breast ([place])   04/13/2016 Mammogram    Scheduler coarse heterogeneous calcifications spanning an area of 3.7 cm in the lumpectomy site, indeterminate. Ultrasound of the left axilla was negative.      04/17/2016 Initial Diagnosis    Cancer of central portion of left breast ([place])      04/17/2016 Initial Biopsy    Left breast core needle biopsy showed invasive and in situ ductal carcinoma with calcification, grade 2.      04/17/2016 Receptors her2    Your 100% positive, PR 15% positive, strong staining, Ki-67 10%, HER-2 positive by IHC (3)      05/05/2016 Imaging    Bilateral breast MRI showed a 1.3 x 0.8 x 0.7 cm biopsy-proven ductal carcinoma in the region of the previous lumpectomy. Enlarged lobulated right inferior axillary lymph node with diffuse cortical thickening, biopsy is recommended.      05/15/2016 Pathology Results    right axilary node biopsy was negative for malignant cell       05/28/2016 Surgery    Simple left mastectomy      05/28/2016 Pathology Results    Left mastectomy showed invasive ductal carcinoma, grade 3, 3.6 cm, high grade DCIS, (+) LVI, surgical margins were negative. Tumor 3.6 cm, no lymph nodes identified.      06/25/2016 Imaging    Staging CT scan of the chest, abdomen and pelvis with contrast and a bone scan showed no evidence of metastasis. Postoperative fluid collection in the left breast, likely a seroma or hematoma. Right axillary adenopathy, which was biopsied previously.       06/26/2016 -  Chemotherapy    Adjuvant chemotherapy with docetaxel, carboplatin, Herceptin and pejeta  every 3 weeks, for 6 cycles, followed by Herceptin maintenance therapy for total of one year treatment         HISTORY OF PRESENTING ILLNESS:  Lindsey Marquez 65 y.o. female is here because of her recurrent left breast cancer.  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 She was seen by multiple medical oncologist in our practice in the past, last was seen by Dr. Alvy Bimler in 11/2013.  She has been doing well, and is compliant with annual screening mammogram. The screening mammogram on 04/17/2016 showed calcification in the left breast, and core needle biopsy showed invasive and in situ ductal carcinoma, grade 2, ER/PR positive, HER-2 amplified. She was referred to  seeing breast surgeon Dr. Georgette Dover and referred back to [place].   She feels well, she did not feel any lump in her breast or axillas latley. She denies any pain, dyspnea, or GI symptoms. She had right nipple rash and right axillary swelling in the past one week, after she drinks some diet tea with citric. Breast MRI showed an enlarged lobulated right inferior axillary lymph node with diffuse cortical thickening, in addition to the known left breast mass which measured 1.3 cm on MRI.  GYN HISTORY  Menarchal: 12 LMP: 2003 Contraceptive: 20 HRT: n/a  G2P2: 63 yo Son and 21 yo son who died   CURRENT THERAPY: adjuvant chemotherapy TCHP every 3 weeks, started on 07/07/2016  INTERIM HISTORY: [name] returns for follow up and second cycle chemo. She tolerated the first cycle chemotherapy well, mild fatigue, nausea and moderate diarrhea after chemotherapy, she has been taking imodium, and diarrhea has resolved last week. She has been eating and drinking well. No other new complaints.  MEDICAL HISTORY:  Past Medical History:  Diagnosis Date  . Anxiety   . Breast cancer [facility]) August 2002   Invasive ductal carcinoma Left breast.  . Diabetes mellitus without complication ([place])   . Family history of malignant neoplasm of ovary   . Family history of pancreatic cancer   . Hypertension     SURGICAL HISTORY: Past Surgical History:  Procedure Laterality Date  . BREAST SURGERY Left 2003  . FOOT SURGERY  2000  . MASTECTOMY Left 05/28/2016  . port a cath insertion  05/28/2016  . PORTACATH PLACEMENT Right 05/28/2016   Procedure: INSERTION PORT-A-CATH;  Surgeon: [name], MD;  Location: [facility];  Service: General;  Laterality: Right;  . TOTAL MASTECTOMY Left 05/28/2016   Procedure: LEFT  MASTECTOMY;  Surgeon: [name], MD;  Location: [place];  Service: General;  Laterality: Left;  . TUBAL LIGATION  22 yrs since  2004.   Bilateral.    SOCIAL HISTORY: Social History   Social History  . Marital  status: Divorced    Spouse name: N/A  . Number of children: N/A  . Years of education: N/A   Occupational History  . Not on file.   Social History Main Topics  . Smoking status: Former Smoker    Packs/day: 0.10    Years: 7.00    Quit date: 12/01/1975  . Smokeless tobacco: Never Used  . Alcohol use No  . Drug use: No  . Sexual activity: Yes   Other Topics Concern  . Not on file   Social History Narrative  . No narrative on file   She is retired from school   FAMILY HISTORY: Family History  Problem Relation  Age of Onset  . Prostate cancer Father   . Ovarian cancer Maternal Aunt 46    deceased  . Cancer Maternal Uncle     "bone ca"; unk. primary; deceased 66s  . Pancreatic cancer Mother 57    deceased  . Cancer Maternal Aunt     2 other mat aunts; unk. primary in 78s; deceased  . Prostate cancer Maternal Uncle     deceased 9    ALLERGIES:  is allergic to codeine.  MEDICATIONS:  Current Outpatient Prescriptions  Medication Sig Dispense Refill  . aspirin 81 MG tablet Take 81 mg by mouth daily.     Marland Kitchen dexamethasone (DECADRON) 4 MG tablet Take 1 tablet (4 mg total) by mouth 2 (two) times daily. Start the day before Taxotere. Then again the day after chemo for 3 days. 30 tablet 1  . furosemide (LASIX) 20 MG tablet Take 0.5 tablets (10 mg total) by mouth daily as needed. 30 tablet 1  . glyBURIDE-metformin (GLUCOVANCE) 2.5-500 MG per tablet Take 1 tablet by mouth 2 (two) times daily with a meal.      . ibuprofen (ADVIL,MOTRIN) 200 MG tablet Take 200 mg by mouth every 8 (eight) hours as needed.      . lidocaine-prilocaine (EMLA) cream Apply to affected area once 30 g 3  . lisinopril-hydrochlorothiazide (PRINZIDE,ZESTORETIC) 20-25 MG per tablet Take 1 tablet by mouth daily.      . Omega-3 Fatty Acids (FISH OIL) 1200 MG CAPS Take 1 capsule by mouth daily.    . ondansetron (ZOFRAN) 8 MG tablet Take 1 tablet (8 mg total) by mouth 2 (two) times daily as needed for refractory  nausea / vomiting. Start on day 3 after chemo. 30 tablet 1  . Potassium 99 MG TABS Take 1 tablet by mouth as needed.     . prochlorperazine (COMPAZINE) 10 MG tablet Take 1 tablet (10 mg total) by mouth every 6 (six) hours as needed (Nausea or vomiting). 30 tablet 1  . traMADol (ULTRAM) 50 MG tablet Take 2 tablets (100 mg total) by mouth every 6 (six) hours as needed for moderate pain. 20 tablet 0   No current facility-administered medications for this visit.     REVIEW OF SYSTEMS:   Constitutional: Denies fevers, chills or abnormal night sweats Eyes: Denies blurriness of vision, double vision or watery eyes Ears, nose, mouth, throat, and face: Denies mucositis or sore throat Respiratory: Denies cough, dyspnea or wheezes Cardiovascular: Denies palpitation, chest discomfort or lower extremity swelling Gastrointestinal:  Denies nausea, heartburn or change in bowel habits Skin: Denies abnormal skin rashes Lymphatics: Denies new lymphadenopathy or easy bruising Neurological:Denies numbness, tingling or new weaknesses Behavioral/Psych: Mood is stable, no new changes  All other systems were reviewed with the patient and are negative.  PHYSICAL EXAMINATION: ECOG PERFORMANCE STATUS: 0  Vitals:   07/27/16 0934  BP: (!) 148/56  Pulse: 90  Resp: 18  Temp: 98.4 F (36.9 C)   Filed Weights   07/27/16 0934  Weight: 229 lb 1.6 oz (103.9 kg)    GENERAL:alert, no distress and comfortable SKIN: skin color, texture, turgor are normal, no rashes or significant lesions EYES: normal, conjunctiva are pink and non-injected, sclera clear OROPHARYNX:no exudate, no erythema and lips, buccal mucosa, and tongue normal  NECK: supple, thyroid normal size, non-tender, without nodularity LYMPH:  no palpable lymphadenopathy in the cervical, axillary or inguinal LUNGS: clear to auscultation and percussion with normal breathing effort HEART: regular rate & rhythm and no murmurs  and no lower extremity  edema ABDOMEN:abdomen soft, non-tender and normal bowel sounds Musculoskeletal:no cyanosis of digits and no clubbing  PSYCH: alert & oriented x 3 with fluent speech NEURO: no focal motor/sensory deficits Breasts: Breast inspection showed left breast is surgically absent, surgical incision has healed well. Exam of the right breast and bilateral axillas reviewed no palpable mass or adenopathy.   LABORATORY DATA:  I have reviewed the data as listed CBC Latest Ref Rng & Units 07/27/2016 07/14/2016 07/06/2016  WBC 3.9 - 10.3 10e3/uL 12.5(H) 22.2(H) 9.3  Hemoglobin 11.6 - 15.9 g/dL 10.9(L) 12.6 12.8  Hematocrit 34.8 - 46.6 % 31.7(L) 38.5 37.2  Platelets 145 - 400 10e3/uL 272 267 311   CMP Latest Ref Rng & Units 07/27/2016 07/14/2016 07/06/2016  Glucose 70 - 140 mg/dl 166(H) 213(H) 275(H)  BUN 7.0 - 26.0 mg/dL 10.4 19.0 15.6  Creatinine 0.6 - 1.1 mg/dL 0.8 1.0 0.8  Sodium 136 - 145 mEq/L 141 135(L) 138  Potassium 3.5 - 5.1 mEq/L 3.1(L) 4.1 4.1  Chloride 101 - 111 mmol/L - - -  CO2 22 - 29 mEq/L 28 21(L) 22  Calcium 8.4 - 10.4 mg/dL 9.4 9.4 9.9  Total Protein 6.4 - 8.3 g/dL 7.3 7.9 8.3  Total Bilirubin 0.20 - 1.20 mg/dL 0.38 <0.30 0.41  Alkaline Phos 40 - 150 U/L 59 80 72  AST 5 - 34 U/L 22 56(H) 29  ALT 0 - 55 U/L 46 78(H) 39     PATHOLOGY REPORT  Diagnosis 04/17/2016 Breast, left, needle core biopsy, inner mid breast - INVASIVE DUCTAL CARCINOMA WITH CALCIFICATIONS, SEE COMMENT. - DUCTAL CARCINOMA IN SITU WITH COMEDONECROSIS. Microscopic Comment While grading is best performed on the resection specimen the invasive carcinoma appears grade 2. Prognostic markers will be ordered and reported in an addendum. Dr. Lyndon Code has reviewed the case. The case was called to [facility] on 04/20/2016.  Results: HER2 - *EQUIVOCAL* (Her2 by IHC will be ordered.) RATIO OF HER2/CEP17 SIGNALS 1.73 AVERAGE HER2 COPY NUMBER PER CELL 4.15 By immunohistochemistry, the tumor cells are positive  for Her2 (3).  Results: IMMUNOHISTOCHEMICAL AND MORPHOMETRIC ANALYSIS PERFORMED MANUALLY Estrogen Receptor: 100%, POSITIVE, STRONG STAINING INTENSITY Progesterone Receptor: 15%, POSITIVE, STRONG STAINING INTENSITY Proliferation Marker Ki67: 10%  Diagnosis 05/15/2016 Lymph node, needle/core biopsy, right axilla - BENIGN REACTIVE LYMPH NODE. - NO GRANULOMAS OR MALIGNANCY IDENTIFIED. - SEE COMMENT. Microscopic Comment Internal departmental review obtained (Dr. Lyndon Code) with agreement. Results are phoned to [place] (05/18/16). (MEG:gt, 05/18/16)   Breast, simple mastectomy, Left 05/28/2016 - INVASIVE DUCTAL CARCINOMA, GRADE III/III, SPANNING 3.6 CM. - DUCTAL CARCINOMA IN SITU, HIGH GRADE. - LYMPHOVASCULAR INVASION IS IDENTIFIED. - THE SURGICAL RESECTION MARGINS ARE NEGATIVE FOR CARCINOMA. - SEE ONCOLOGY TABLE BELOW. Microscopic Comment BREAST, INVASIVE TUMOR, WITHOUT LYMPH NODES PRESENT Specimen, including laterality : Left breast Procedure: Simple mastectomy Histologic type: Ductal with micropapillary features Grade: III Tubule formation: 3 Nuclear pleomorphism: 2 Mitotic: 3 Tumor size (gross measurement): 3.6 cm Margins: Greater than 0.2 cm to all margins Lymphovascular invasion: Present Ductal carcinoma in situ: Present Grade: High grade Extensive intraductal component: Yes Lobular neoplasia: Not identified Tumor focality: Unifocal Treatment effect: N/A Extent of tumor: Confined to breast parenchyma. Breast prognostic profile: [phone] Estrogen receptor: 100%, strong staining intensity Progesterone receptor: 15%, strong staining intensity Her 2 neu: Amplification was detected by immunohistochemistry. Ki-67: 10% 1 of 2 FINAL for Lindsey Marquez, Lindsey Marquez ([id number]) Microscopic Comment(continued) Non-neoplastic breast: No significant findings. TNM: pT2,  pNX (clinically recurrent). Comments: In addition to the main tumor, there is ductal  carcinoma in situ present in random tissue submitted from the inferior lateral quadrant. (JBK:kh 06-01-16)   RADIOGRAPHIC STUDIES: I have personally reviewed the radiological images as listed and agreed with the findings in the report. No results found.  ASSESSMENT & PLAN:  65 year old African-American female, postmenopausal, history of left breast triple negative cancer in 2003, presented with mammogram discovered left breast cancer, triple positive.  1. Cancer of central portion of left breast, pT2NxM0, stage IIA, G3, triple positive -I reviewed her surgical pathology findings in great details with patient and her family member. It showed a 3.6 cm mass, grade 3, surgical margins were negative. She had no sentinel lymph node biopsy due to her prior history of axillary lymph node dissection. -Her restaging CT scan of bone scan showed no evidence of distant metastasis, I reviewed with patient. -We discussed the risk of cancer recurrence of this complete surgical resection. We reviewed that HER-2 positive with cancers are more aggressive, with increased risk of metastasis.  -Given the moderate to high risk of cancer recurrence, I recommend adjuvant chemotherapy to reduce her risk of cancer recurrence. I recommend chemotherapy and duo anti-HER-2 agents TCHP (docetaxel, carbotaxol, Herceptin, pejeta), every 3 weeks for total of 6 cycles, followed by maintenance Herceptin every 3 weeks to complete 1 year treatment. -She tolerated the first cycle chemotherapy well overall. Diarrhea has resolved. -Lab results reviewed with her, mild anemia, leukocytosis secondary to Neulasta, we'll proceed with second cycle chemotherapy today, no Neulasta.  2. DM and HTN  -She'll continue follow-up with her primary care physician -We discussed her blood pressure and glucose level may be impacted by chemotherapy, and dexamethasone she takes before and after chemotherapy. -I strongly encouraged her to monitor her blood  pressure and glucose at home, and consider increasing the dose of metformin and glyburide if needed  Plan -Second cycle chemotherapy TCHP today, no neulasta -RTC in 3 weeks for cycle 3    All questions were answered. The patient knows to call the clinic with any problems, questions or concerns.  I spent 15 minutes counseling the patient face to face. The total time spent in the appointment was 20 minutes and more than 50% was on counseling.     [name], MD 07/27/2016

## 2016-07-27 NOTE — Patient Instructions (Signed)
[facility] Discharge Instructions for Patients Receiving Chemotherapy  Today you received the following chemotherapy agents :  Herceptin, Perjeta, Taxotere, Carboplatin.  To help prevent nausea and vomiting after your treatment, we encourage you to take your nausea medication as prescribed.   If you develop nausea and vomiting that is not controlled by your nausea medication, call the clinic.   BELOW ARE SYMPTOMS THAT SHOULD BE REPORTED IMMEDIATELY:  *FEVER GREATER THAN 100.5 F  *CHILLS WITH OR WITHOUT FEVER  NAUSEA AND VOMITING THAT IS NOT CONTROLLED WITH YOUR NAUSEA MEDICATION  *UNUSUAL SHORTNESS OF BREATH  *UNUSUAL BRUISING OR BLEEDING  TENDERNESS IN MOUTH AND THROAT WITH OR WITHOUT PRESENCE OF ULCERS  *URINARY PROBLEMS  *BOWEL PROBLEMS  UNUSUAL RASH Items with * indicate a potential emergency and should be followed up as soon as possible.  Feel free to call the clinic you have any questions or concerns. The clinic phone number is [phone].  Please show the CHEMO ALERT CARD at check-in to the Emergency Department and triage nurse.   

## 2016-07-27 NOTE — Progress Notes (Signed)
65 year old female diagnosed with recurrent breast cancer. She is a patient of Dr. Burr Medico.  PMH includes DM, HTN, and Anxiety.  Medications include decadron, lasix, omega 3 fatty acids, Zofran, and compazine.  Labs include Glucose 166, Albumin 3.3, and K 3.1 on 07/27/16.  Height: 69 inches Weight: 229.1 pounds UBW: 235 pounds. BMI: 33.83  Patient reports 7 pound weight loss after last treatment. She reports some nausea which was controlled with medication. She had diarrhea. She tries to eat small, frequent meals.  Nutrition Diagnosis: Inadequate oral intake related to N and D as evidenced by 6 pound weight loss from UBW.  Intervention:  Educated patient to continue small frequent meals and snacks with high protein, high calorie foods to promote weight maintenance. Educated patient on strategies for eating with nausea and diarrhea. Provided fact sheets. Questions answered and teachback method used.  Monitoring, Evaluation, Goals: Patient will tolerate adequate calories and protein to promote maintenance of lean body mass.  Next Visit: Monday, September 18, during infusion.

## 2016-08-05 ENCOUNTER — Telehealth: Payer: Self-pay | Admitting: *Deleted

## 2016-08-05 NOTE — Telephone Encounter (Signed)
FYI  "This is Forensic scientist office.  Need fax number to send medical clearance for this mutual patient to have dental work done.  Not a cleaning."  Provided fax number [phone].

## 2016-08-06 ENCOUNTER — Ambulatory Visit (HOSPITAL_COMMUNITY)
Admission: RE | Admit: 2016-08-06 | Discharge: 2016-08-06 | Disposition: A | Discharge status: Home / Self Care | Payer: Medicare PPO | Source: Ambulatory Visit | Attending: Internal Medicine | Admitting: Internal Medicine

## 2016-08-06 ENCOUNTER — Other Ambulatory Visit (HOSPITAL_COMMUNITY): Payer: Medicare PPO

## 2016-08-06 VITALS — BP 120/58 | HR 122 | Wt 224.0 lb

## 2016-08-06 DIAGNOSIS — Z923 Personal history of irradiation: Secondary | ICD-10-CM | POA: Diagnosis not present

## 2016-08-06 DIAGNOSIS — C50112 Malignant neoplasm of central portion of left female breast: Secondary | ICD-10-CM | POA: Insufficient documentation

## 2016-08-06 DIAGNOSIS — F419 Anxiety disorder, unspecified: Secondary | ICD-10-CM | POA: Insufficient documentation

## 2016-08-06 DIAGNOSIS — R921 Mammographic calcification found on diagnostic imaging of breast: Secondary | ICD-10-CM | POA: Diagnosis not present

## 2016-08-06 DIAGNOSIS — I10 Essential (primary) hypertension: Secondary | ICD-10-CM | POA: Diagnosis not present

## 2016-08-06 DIAGNOSIS — Z9012 Acquired absence of left breast and nipple: Secondary | ICD-10-CM | POA: Insufficient documentation

## 2016-08-06 DIAGNOSIS — Z9221 Personal history of antineoplastic chemotherapy: Secondary | ICD-10-CM | POA: Insufficient documentation

## 2016-08-06 DIAGNOSIS — C50912 Malignant neoplasm of unspecified site of left female breast: Secondary | ICD-10-CM

## 2016-08-06 DIAGNOSIS — Z9851 Tubal ligation status: Secondary | ICD-10-CM | POA: Diagnosis not present

## 2016-08-06 DIAGNOSIS — Z8042 Family history of malignant neoplasm of prostate: Secondary | ICD-10-CM | POA: Insufficient documentation

## 2016-08-06 DIAGNOSIS — Z808 Family history of malignant neoplasm of other organs or systems: Secondary | ICD-10-CM | POA: Diagnosis not present

## 2016-08-06 DIAGNOSIS — Z170 Estrogen receptor positive status [ER+]: Secondary | ICD-10-CM | POA: Diagnosis not present

## 2016-08-06 DIAGNOSIS — Z87891 Personal history of nicotine dependence: Secondary | ICD-10-CM | POA: Diagnosis not present

## 2016-08-06 DIAGNOSIS — Z800 Family history of malignant neoplasm of digestive organs: Secondary | ICD-10-CM | POA: Insufficient documentation

## 2016-08-06 DIAGNOSIS — Z888 Allergy status to other drugs, medicaments and biological substances status: Secondary | ICD-10-CM | POA: Diagnosis not present

## 2016-08-06 DIAGNOSIS — Z7982 Long term (current) use of aspirin: Secondary | ICD-10-CM | POA: Diagnosis not present

## 2016-08-06 DIAGNOSIS — N959 Unspecified menopausal and perimenopausal disorder: Secondary | ICD-10-CM | POA: Insufficient documentation

## 2016-08-06 DIAGNOSIS — E119 Type 2 diabetes mellitus without complications: Secondary | ICD-10-CM | POA: Insufficient documentation

## 2016-08-06 DIAGNOSIS — Z8041 Family history of malignant neoplasm of ovary: Secondary | ICD-10-CM | POA: Insufficient documentation

## 2016-08-06 NOTE — Progress Notes (Signed)
CARDIO-ONCOLOGY CONSULT NOTE  Patient Care Team: [name], NP as PCP - General (Internal Medicine) [name], MD as Consulting Physician (General Surgery) 08/06/2016 Referring MD: Burr Medico  HISTORY OF PRESENTING ILLNESS:   Lindsey Marquez is a 65 y/o woman with h/o HTN, DM2 and breast CA referred by Dr. Burr Medico for enrollment into the [facility].   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 Screening mammogram on 04/17/2016 showed calcification in the left breast, and core needle biopsy showed invasive and in situ ductal carcinoma, grade 2, ER/PR positive, HER-2 amplified.   She feels well. Denies any history of known heart disease. Active with no CP or SOB. No palpitations. No edema.   She is s/p left mastectomy in 6/17. Has begun adjuvant chemotherapy on 07/07/16 with docetaxel, carboplatin, Herceptin and pejeta every 3 weeks, for 6 cycles, followed by Herceptin maintenance therapy for total of one year   Echo 05/27/16: EF 55-60% GLS -17.2%  Lateral s' 12.1 cm/sec (reviewed personally)  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 [facility])   Staging form: Breast, AJCC 7th Edition     Clinical stage from 04/17/2016: Stage IA (T1c, N0, M0) - Signed by [name], MD on 05/07/2016     Pathologic stage from 05/28/2016: Stage IIA (T2, N0, cM0) - Signed by [name], MD on 06/11/2016 History of left breast cancer   Staging form: Breast, AJCC 7th Edition     Clinical: Stage IIIB (T4d, N1, cM0) - Signed by [name], MD on 12/14/2013     Pathologic: Stage IIIB (T4d, N1a, cM0) - Signed by [name], MD on 12/14/2013       History of left breast cancer   09/12/2002 Imaging    CT scan show no evidence of disease apart from the left axilla      09/2002 -  Neo-Adjuvant Chemotherapy    TAC X4 cycles       01/10/2003 Surgery    The  patient had left lumpectomy and axillary lymph node dissection which show residual breast cancer and a sentinel lymph node was positive      2004 -  Adjuvant Chemotherapy    Cytoxan with 5-FU x4 cycles (methotrexate added at cycle 4).      2004 -  Radiation Therapy    Adjuvant left breast radiation      12/2002 Receptors her2    ER, PR and HER-2 were all negative.       Cancer of central portion of left breast ([place])   04/13/2016 Mammogram    Scheduler coarse heterogeneous calcifications spanning an area of 3.7 cm in the lumpectomy site, indeterminate. Ultrasound of the left axilla was negative.      04/17/2016 Initial Diagnosis    Cancer of central portion of left breast ([place])      04/17/2016 Initial Biopsy    Left breast core needle biopsy showed invasive and in situ ductal carcinoma with calcification, grade 2.      04/17/2016 Receptors her2    Your 100% positive, PR 15% positive, strong staining, Ki-67 10%, HER-2 positive by IHC (3)      05/05/2016 Imaging    Bilateral breast MRI showed a 1.3 x 0.8 x 0.7 cm biopsy-proven ductal carcinoma in the region of the previous lumpectomy. Enlarged lobulated right inferior axillary lymph node with diffuse cortical thickening, biopsy is recommended.  05/15/2016 Pathology Results    right axilary node biopsy was negative for malignant cell       05/28/2016 Surgery    Simple left mastectomy      05/28/2016 Pathology Results    Left mastectomy showed invasive ductal carcinoma, grade 3, 3.6 cm, high grade DCIS, (+) LVI, surgical margins were negative. Tumor 3.6 cm, no lymph nodes identified.      06/25/2016 Imaging    Staging CT scan of the chest, abdomen and pelvis with contrast and a bone scan showed no evidence of metastasis. Postoperative fluid collection in the left breast, likely a seroma or hematoma. Right axillary adenopathy, which was biopsied previously.       07/07/2016 -  Chemotherapy    Adjuvant chemotherapy with  docetaxel, carboplatin, Herceptin and pejeta every 3 weeks, for 6 cycles, followed by Herceptin maintenance therapy for total of one year treatment         MEDICAL HISTORY:  Past Medical History:  Diagnosis Date  . Anxiety   . Breast cancer [facility]) August 2002   Invasive ductal carcinoma Left breast.  . Diabetes mellitus without complication ([place])   . Family history of malignant neoplasm of ovary   . Family history of pancreatic cancer   . Hypertension     SURGICAL HISTORY: Past Surgical History:  Procedure Laterality Date  . BREAST SURGERY Left 2003  . FOOT SURGERY  2000  . MASTECTOMY Left 05/28/2016  . port a cath insertion  05/28/2016  . PORTACATH PLACEMENT Right 05/28/2016   Procedure: INSERTION PORT-A-CATH;  Surgeon: [name], MD;  Location: [place];  Service: General;  Laterality: Right;  . TOTAL MASTECTOMY Left 05/28/2016   Procedure: LEFT  MASTECTOMY;  Surgeon: [name], MD;  Location: [place];  Service: General;  Laterality: Left;  . TUBAL LIGATION  22 yrs since  2004.   Bilateral.    SOCIAL HISTORY: Social History   Social History  . Marital status: Divorced    Spouse name: N/A  . Number of children: N/A  . Years of education: N/A   Occupational History  . Not on file.   Social History Main Topics  . Smoking status: Former Smoker    Packs/day: 0.10    Years: 7.00    Quit date: 12/01/1975  . Smokeless tobacco: Never Used  . Alcohol use No  . Drug use: No  . Sexual activity: Yes   Other Topics Concern  . Not on file   Social History Narrative  . No narrative on file    FAMILY HISTORY: Family History  Problem Relation Age of Onset  . Prostate cancer Father   . Ovarian cancer Maternal Aunt 9    deceased  . Cancer Maternal Uncle     "bone ca"; unk. primary; deceased 71s  . Pancreatic cancer Mother 72    deceased  . Cancer Maternal Aunt     2 other mat aunts; unk. primary in 3s; deceased  . Prostate cancer Maternal Uncle     deceased 69      ALLERGIES:  is allergic to codeine.  MEDICATIONS:  Current Outpatient Prescriptions  Medication Sig Dispense Refill  . aspirin 81 MG tablet Take 81 mg by mouth daily.     Marland Kitchen dexamethasone (DECADRON) 4 MG tablet Take 1 tablet (4 mg total) by mouth 2 (two) times daily. Start the day before Taxotere. Then again the day after chemo for 3 days. 30 tablet 1  . furosemide (LASIX) 20 MG tablet  Take 0.5 tablets (10 mg total) by mouth daily as needed. 30 tablet 1  . glyBURIDE-metformin (GLUCOVANCE) 2.5-500 MG per tablet Take 1 tablet by mouth 2 (two) times daily with a meal.      . ibuprofen (ADVIL,MOTRIN) 200 MG tablet Take 200 mg by mouth every 8 (eight) hours as needed.      . lidocaine-prilocaine (EMLA) cream Apply to affected area once 30 g 3  . lisinopril-hydrochlorothiazide (PRINZIDE,ZESTORETIC) 20-25 MG per tablet Take 1 tablet by mouth daily.      . Omega-3 Fatty Acids (FISH OIL) 1200 MG CAPS Take 1 capsule by mouth daily.    . ondansetron (ZOFRAN) 8 MG tablet Take 1 tablet (8 mg total) by mouth 2 (two) times daily as needed for refractory nausea / vomiting. Start on day 3 after chemo. 30 tablet 1  . Potassium 99 MG TABS Take 1 tablet by mouth as needed.     . prochlorperazine (COMPAZINE) 10 MG tablet Take 1 tablet (10 mg total) by mouth every 6 (six) hours as needed (Nausea or vomiting). 30 tablet 1  . traMADol (ULTRAM) 50 MG tablet Take 2 tablets (100 mg total) by mouth every 6 (six) hours as needed for moderate pain. 20 tablet 0   No current facility-administered medications for this encounter.     REVIEW OF SYSTEMS:   Constitutional: Denies fevers, chills or abnormal night sweats Eyes: Denies blurriness of vision, double vision or watery eyes Ears, nose, mouth, throat, and face: Denies mucositis or sore throat Respiratory: Denies cough, dyspnea or wheezes Cardiovascular: Denies palpitation, chest discomfort or lower extremity swelling Gastrointestinal:  Denies nausea, heartburn or  change in bowel habits Skin: Denies abnormal skin rashes Lymphatics: Denies new lymphadenopathy or easy bruising Neurological:Denies numbness, tingling or new weaknesses Behavioral/Psych: Mood is stable, no new changes  All other systems were reviewed with the patient and are negative.  PHYSICAL EXAMINATION: Vitals:   08/06/16 1216  BP: (!) 120/58  Pulse: (!) 122  SpO2: 100%  Weight: 224 lb (101.6 kg)   General:  Well appearing. No resp difficulty HEENT: normal Neck: supple. no JVD. Carotids 2+ bilat; no bruits. No lymphadenopathy or thryomegaly appreciated. Cor: PMI nondisplaced. Regular rate & rhythm. No rubs, gallops or murmurs. Lungs: clear Abdomen: soft, nontender, nondistended. No hepatosplenomegaly. No bruits or masses. Good bowel sounds. Extremities: no cyanosis, clubbing, rash, edema Neuro: alert & orientedx3, cranial nerves grossly intact. moves all 4 extremities w/o difficulty. Affect pleasant    LABORATORY DATA:  I have reviewed the data as listed CBC Latest Ref Rng & Units 07/27/2016 07/14/2016 07/06/2016  WBC 3.9 - 10.3 10e3/uL 12.5(H) 22.2(H) 9.3  Hemoglobin 11.6 - 15.9 g/dL 10.9(L) 12.6 12.8  Hematocrit 34.8 - 46.6 % 31.7(L) 38.5 37.2  Platelets 145 - 400 10e3/uL 272 267 311   CMP Latest Ref Rng & Units 07/27/2016 07/14/2016 07/06/2016  Glucose 70 - 140 mg/dl 166(H) 213(H) 275(H)  BUN 7.0 - 26.0 mg/dL 10.4 19.0 15.6  Creatinine 0.6 - 1.1 mg/dL 0.8 1.0 0.8  Sodium 136 - 145 mEq/L 141 135(L) 138  Potassium 3.5 - 5.1 mEq/L 3.1(L) 4.1 4.1  Chloride 101 - 111 mmol/L - - -  CO2 22 - 29 mEq/L 28 21(L) 22  Calcium 8.4 - 10.4 mg/dL 9.4 9.4 9.9  Total Protein 6.4 - 8.3 g/dL 7.3 7.9 8.3  Total Bilirubin 0.20 - 1.20 mg/dL 0.38 <0.30 0.41  Alkaline Phos 40 - 150 U/L 59 80 72  AST 5 - 34 U/L  22 56(H) 29  ALT 0 - 55 U/L 46 78(H) 39    ASSESSMENT & PLAN:  65 year old African-American female, postmenopausal, history of left breast triple negative cancer in 2003, presented  with mammogram discovered left breast cancer, triple positive.  1. Cancer of central portion of left breast, pT2NxM0, stage IIA, G3, triple positive -- s/p previous treatment for breast CA (with TAC) in 2003 -- She has started therapy with adjuvant chemotherapy to include: anti-HER-2 agents TCHP (docetaxel, carbotaxol, Herceptin, pejeta), every 3 weeks for total of 6 cycles, followed by maintenance Herceptin every 3 weeks to complete 1 year treatment. --Explained incidence of Herceptin cardiotoxicity (in setting of previous TAC is 30%) and role of Cardio-oncology clinic at length. Echo images reviewed personally. All parameters stable. Reviewed signs and symptoms of HF to look for. Continue Herceptin. Follow-up with echo in 3 months.   Lindsey Bickers, MD 08/06/2016

## 2016-08-06 NOTE — Patient Instructions (Signed)
Return in 2 months for follow up with Dr. Haroldine Laws with ECHO

## 2016-08-10 ENCOUNTER — Other Ambulatory Visit: Payer: Self-pay

## 2016-08-10 MED ORDER — LISINOPRIL-HYDROCHLOROTHIAZIDE 20-25 MG PO TABS: 1.0000 | ORAL_TABLET | Freq: Every day | ORAL | 1 refills | Status: DC

## 2016-08-10 NOTE — Telephone Encounter (Signed)
Pt has appt on 09/24/16 for f/u; pt established care on 03/24/16; refill done per protocol until pt seen.[facility].

## 2016-08-17 ENCOUNTER — Ambulatory Visit: Payer: Medicare PPO | Admitting: Nutrition

## 2016-08-17 ENCOUNTER — Ambulatory Visit (HOSPITAL_BASED_OUTPATIENT_CLINIC_OR_DEPARTMENT_OTHER): Payer: Medicare PPO

## 2016-08-17 ENCOUNTER — Ambulatory Visit (HOSPITAL_BASED_OUTPATIENT_CLINIC_OR_DEPARTMENT_OTHER): Payer: Medicare PPO | Admitting: Hematology

## 2016-08-17 ENCOUNTER — Other Ambulatory Visit (HOSPITAL_BASED_OUTPATIENT_CLINIC_OR_DEPARTMENT_OTHER): Payer: Medicare PPO

## 2016-08-17 ENCOUNTER — Encounter: Payer: Self-pay | Admitting: Hematology

## 2016-08-17 ENCOUNTER — Ambulatory Visit: Payer: Medicare PPO

## 2016-08-17 VITALS — BP 144/68 | HR 85 | Temp 98.5°F | Resp 18 | Ht 69.0 in | Wt 231.4 lb

## 2016-08-17 DIAGNOSIS — I10 Essential (primary) hypertension: Secondary | ICD-10-CM

## 2016-08-17 DIAGNOSIS — Z5112 Encounter for antineoplastic immunotherapy: Secondary | ICD-10-CM | POA: Diagnosis not present

## 2016-08-17 DIAGNOSIS — Z853 Personal history of malignant neoplasm of breast: Secondary | ICD-10-CM

## 2016-08-17 DIAGNOSIS — Z5111 Encounter for antineoplastic chemotherapy: Secondary | ICD-10-CM

## 2016-08-17 DIAGNOSIS — Z170 Estrogen receptor positive status [ER+]: Secondary | ICD-10-CM

## 2016-08-17 DIAGNOSIS — C50112 Malignant neoplasm of central portion of left female breast: Secondary | ICD-10-CM

## 2016-08-17 DIAGNOSIS — E119 Type 2 diabetes mellitus without complications: Secondary | ICD-10-CM | POA: Diagnosis not present

## 2016-08-17 DIAGNOSIS — Z95828 Presence of other vascular implants and grafts: Secondary | ICD-10-CM

## 2016-08-17 LAB — CBC WITH DIFFERENTIAL/PLATELET
BASO%: 0.1 % (ref 0.0–2.0)
Basophils Absolute: 0 10*3/uL (ref 0.0–0.1)
EOS%: 0 % (ref 0.0–7.0)
Eosinophils Absolute: 0 10*3/uL (ref 0.0–0.5)
HCT: 31.2 % — ABNORMAL LOW (ref 34.8–46.6)
HGB: 10.5 g/dL — ABNORMAL LOW (ref 11.6–15.9)
LYMPH%: 13.8 % — ABNORMAL LOW (ref 14.0–49.7)
MCH: 33.2 pg (ref 25.1–34.0)
MCHC: 33.8 g/dL (ref 31.5–36.0)
MCV: 98.4 fL (ref 79.5–101.0)
MONO#: 0.4 10*3/uL (ref 0.1–0.9)
MONO%: 4.7 % (ref 0.0–14.0)
NEUT#: 6.4 10*3/uL (ref 1.5–6.5)
NEUT%: 81.4 % — ABNORMAL HIGH (ref 38.4–76.8)
Platelets: 206 10*3/uL (ref 145–400)
RBC: 3.17 10*6/uL — ABNORMAL LOW (ref 3.70–5.45)
RDW: 14.1 % (ref 11.2–14.5)
WBC: 7.8 10*3/uL (ref 3.9–10.3)
lymph#: 1.1 10*3/uL (ref 0.9–3.3)

## 2016-08-17 LAB — COMPREHENSIVE METABOLIC PANEL
ALT: 52 U/L (ref 0–55)
AST: 38 U/L — ABNORMAL HIGH (ref 5–34)
Albumin: 3.3 g/dL — ABNORMAL LOW (ref 3.5–5.0)
Alkaline Phosphatase: 56 U/L (ref 40–150)
Anion Gap: 12 mEq/L — ABNORMAL HIGH (ref 3–11)
BUN: 15.9 mg/dL (ref 7.0–26.0)
CO2: 26 mEq/L (ref 22–29)
Calcium: 9.5 mg/dL (ref 8.4–10.4)
Chloride: 99 mEq/L (ref 98–109)
Creatinine: 0.8 mg/dL (ref 0.6–1.1)
EGFR: >90 mL/min/{1.73_m2} (ref >90)
Glucose: 165 mg/dl — ABNORMAL HIGH (ref 70–140)
Potassium: 4.1 mEq/L (ref 3.5–5.1)
Sodium: 137 mEq/L (ref 136–145)
Total Bilirubin: 0.37 mg/dL (ref 0.20–1.20)
Total Protein: 7.5 g/dL (ref 6.4–8.3)

## 2016-08-17 MED ORDER — SODIUM CHLORIDE 0.9 % IV SOLN
706.2000 mg | Freq: Once | INTRAVENOUS | Status: AC
  Administered 2016-08-17: 710 mg via INTRAVENOUS
  Filled 2016-08-17: qty 71

## 2016-08-17 MED ORDER — PEGFILGRASTIM 6 MG/0.6ML ~~LOC~~ PSKT
6.0000 mg | PREFILLED_SYRINGE | Freq: Once | SUBCUTANEOUS | Status: DC
  Filled 2016-08-17: qty 0.6

## 2016-08-17 MED ORDER — DIPHENHYDRAMINE HCL 25 MG PO CAPS
50.0000 mg | ORAL_CAPSULE | Freq: Once | ORAL | Status: AC
  Administered 2016-08-17: 50 mg via ORAL

## 2016-08-17 MED ORDER — SODIUM CHLORIDE 0.9 % IV SOLN
6.0000 mg/kg | Freq: Once | INTRAVENOUS | Status: AC
  Administered 2016-08-17: 630 mg via INTRAVENOUS
  Filled 2016-08-17: qty 30

## 2016-08-17 MED ORDER — SODIUM CHLORIDE 0.9 % IV SOLN
75.0000 mg/m2 | Freq: Once | INTRAVENOUS | Status: AC
  Administered 2016-08-17: 170 mg via INTRAVENOUS
  Filled 2016-08-17: qty 16

## 2016-08-17 MED ORDER — PALONOSETRON HCL INJECTION 0.25 MG/5ML
INTRAVENOUS | Status: AC
  Filled 2016-08-17: qty 5

## 2016-08-17 MED ORDER — ACETAMINOPHEN 325 MG PO TABS
ORAL_TABLET | ORAL | Status: AC
  Filled 2016-08-17: qty 2

## 2016-08-17 MED ORDER — SODIUM CHLORIDE 0.9% FLUSH
10.0000 mL | INTRAVENOUS | Status: DC | PRN
  Administered 2016-08-17: 10 mL
  Filled 2016-08-17: qty 10

## 2016-08-17 MED ORDER — ACETAMINOPHEN 325 MG PO TABS
650.0000 mg | ORAL_TABLET | Freq: Once | ORAL | Status: AC
  Administered 2016-08-17: 650 mg via ORAL

## 2016-08-17 MED ORDER — SODIUM CHLORIDE 0.9 % IV SOLN
Freq: Once | INTRAVENOUS | Status: AC
  Administered 2016-08-17: 11:00:00 via INTRAVENOUS

## 2016-08-17 MED ORDER — PALONOSETRON HCL INJECTION 0.25 MG/5ML
0.2500 mg | Freq: Once | INTRAVENOUS | Status: AC
  Administered 2016-08-17: 0.25 mg via INTRAVENOUS

## 2016-08-17 MED ORDER — SODIUM CHLORIDE 0.9 % IV SOLN
10.0000 mg | Freq: Once | INTRAVENOUS | Status: AC
  Administered 2016-08-17: 10 mg via INTRAVENOUS
  Filled 2016-08-17: qty 1

## 2016-08-17 MED ORDER — SODIUM CHLORIDE 0.9 % IJ SOLN
10.0000 mL | INTRAMUSCULAR | Status: DC | PRN
  Administered 2016-08-17: 10 mL via INTRAVENOUS
  Filled 2016-08-17: qty 10

## 2016-08-17 MED ORDER — HEPARIN SOD (PORK) LOCK FLUSH 100 UNIT/ML IV SOLN
500.0000 [IU] | Freq: Once | INTRAVENOUS | Status: AC | PRN
  Administered 2016-08-17: 500 [IU]
  Filled 2016-08-17: qty 5

## 2016-08-17 MED ORDER — PERTUZUMAB CHEMO INJECTION 420 MG/14ML
420.0000 mg | Freq: Once | INTRAVENOUS | Status: AC
  Administered 2016-08-17: 420 mg via INTRAVENOUS
  Filled 2016-08-17: qty 14

## 2016-08-17 MED ORDER — DIPHENHYDRAMINE HCL 25 MG PO CAPS
ORAL_CAPSULE | ORAL | Status: AC
  Filled 2016-08-17: qty 2

## 2016-08-17 NOTE — Patient Instructions (Signed)

## 2016-08-17 NOTE — Patient Instructions (Signed)
[facility] Discharge Instructions for Patients Receiving Chemotherapy  Today you received the following chemotherapy agents :  Herceptin, Perjeta, Taxotere, Carboplatin.  To help prevent nausea and vomiting after your treatment, we encourage you to take your nausea medication as prescribed.   If you develop nausea and vomiting that is not controlled by your nausea medication, call the clinic.   BELOW ARE SYMPTOMS THAT SHOULD BE REPORTED IMMEDIATELY:  *FEVER GREATER THAN 100.5 F  *CHILLS WITH OR WITHOUT FEVER  NAUSEA AND VOMITING THAT IS NOT CONTROLLED WITH YOUR NAUSEA MEDICATION  *UNUSUAL SHORTNESS OF BREATH  *UNUSUAL BRUISING OR BLEEDING  TENDERNESS IN MOUTH AND THROAT WITH OR WITHOUT PRESENCE OF ULCERS  *URINARY PROBLEMS  *BOWEL PROBLEMS  UNUSUAL RASH Items with * indicate a potential emergency and should be followed up as soon as possible.  Feel free to call the clinic you have any questions or concerns. The clinic phone number is [phone].  Please show the CHEMO ALERT CARD at check-in to the Emergency Department and triage nurse.   

## 2016-08-17 NOTE — Progress Notes (Signed)
[place]  [phone] Fax:([phone]) [phone]  Clinic Follow Up Note   Patient Care Team: [name], NP as PCP - General (Internal Medicine) [name], MD as Consulting Physician (General Surgery) 08/17/2016   CHIEF COMPLAINTS:  Follow up recurrent left breast cancer   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 [facility])   Staging form: Breast, AJCC 7th Edition     Clinical stage from 04/17/2016: Stage IA (T1c, N0, M0) - Signed by [name], MD on 05/07/2016     Pathologic stage from 05/28/2016: Stage IIA (T2, N0, cM0) - Signed by [name], MD on 06/11/2016 History of left breast cancer   Staging form: Breast, AJCC 7th Edition     Clinical: Stage IIIB (T4d, N1, cM0) - Signed by [name], MD on 12/14/2013     Pathologic: Stage IIIB (T4d, N1a, cM0) - Signed by [name], MD on 12/14/2013       History of left breast cancer   09/12/2002 Imaging    CT scan show no evidence of disease apart from the left axilla      09/2002 -  Neo-Adjuvant Chemotherapy    TAC X4 cycles       01/10/2003 Surgery    The patient had left lumpectomy and axillary lymph node dissection which show residual breast cancer and a sentinel lymph node was positive      2004 -  Adjuvant Chemotherapy    Cytoxan with 5-FU x4 cycles (methotrexate added at cycle 4).      2004 -  Radiation Therapy    Adjuvant left breast radiation      12/2002 Receptors her2    ER, PR and HER-2 were all negative.       Cancer of central portion of left breast ([place])   04/13/2016 Mammogram    Scheduler coarse heterogeneous calcifications spanning an area of 3.7 cm in the lumpectomy site, indeterminate. Ultrasound of the left axilla was negative.      04/17/2016 Initial Diagnosis    Cancer of central portion of left breast ([facility])      04/17/2016 Initial Biopsy    Left breast core needle biopsy showed invasive and in situ ductal carcinoma with calcification, grade 2.      04/17/2016 Receptors her2    Your 100% positive, PR 15% positive, strong staining, Ki-67 10%, HER-2 positive by IHC (3)      05/05/2016 Imaging    Bilateral breast MRI showed a 1.3 x 0.8 x 0.7 cm biopsy-proven ductal carcinoma in the region of the previous lumpectomy. Enlarged lobulated right inferior axillary lymph node with diffuse cortical thickening, biopsy is recommended.      05/15/2016 Pathology Results    right axilary node biopsy was negative for malignant cell       05/28/2016 Surgery    Simple left mastectomy      05/28/2016 Pathology Results    Left mastectomy showed invasive ductal carcinoma, grade 3, 3.6 cm, high grade DCIS, (+) LVI, surgical margins were negative. Tumor 3.6 cm, no lymph nodes identified.      06/25/2016 Imaging    Staging CT scan of the chest, abdomen and pelvis with contrast and a bone scan showed no evidence of metastasis. Postoperative fluid collection in the left breast, likely a seroma or hematoma. Right axillary adenopathy, which was biopsied previously.       07/07/2016 -  Chemotherapy    Adjuvant chemotherapy with docetaxel, carboplatin, Herceptin and pejeta  every 3 weeks, for 6 cycles, followed by Herceptin maintenance therapy for total of one year treatment         HISTORY OF PRESENTING ILLNESS:  Lindsey Marquez 65 y.o. female is here because of her recurrent left breast cancer.  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 She was seen by multiple medical oncologist in our practice in the past, last was seen by Dr. Alvy Bimler in 11/2013.  She has been doing well, and is compliant with annual screening mammogram. The screening mammogram on 04/17/2016 showed calcification in the left breast, and core needle biopsy showed invasive and in situ ductal carcinoma, grade 2, ER/PR positive, HER-2 amplified. She was referred to  seeing breast surgeon Dr. Georgette Dover and referred back to [place].   She feels well, she did not feel any lump in her breast or axillas latley. She denies any pain, dyspnea, or GI symptoms. She had right nipple rash and right axillary swelling in the past one week, after she drinks some diet tea with citric. Breast MRI showed an enlarged lobulated right inferior axillary lymph node with diffuse cortical thickening, in addition to the known left breast mass which measured 1.3 cm on MRI.  GYN HISTORY  Menarchal: 12 LMP: 2003 Contraceptive: 20 HRT: n/a  G2P2: 104 yo Son and 58 yo son who died   CURRENT THERAPY: adjuvant chemotherapy TCHP every 3 weeks, started on 07/07/2016  INTERIM HISTORY: Ms Mckell returns for follow up and third cycle chemo.  She is doing well overall, tolerating chemotherapy very well. She has mild fatigue, takes a nap some time, mild low appetite and loose bowel movement after chemotherapy, recovers well. No significant diarrhea, no fever or chills, her weight has been stable. She has mild left lower extremity edema, improved with leg elevation, no other complaints.  MEDICAL HISTORY:  Past Medical History:  Diagnosis Date  . Anxiety   . Breast cancer [facility]) August 2002   Invasive ductal carcinoma Left breast.  . Diabetes mellitus without complication ([place])   . Family history of malignant neoplasm of ovary   . Family history of pancreatic cancer   . Hypertension     SURGICAL HISTORY: Past Surgical History:  Procedure Laterality Date  . BREAST SURGERY Left 2003  . FOOT SURGERY  2000  . MASTECTOMY Left 05/28/2016  . port a cath insertion  05/28/2016  . PORTACATH PLACEMENT Right 05/28/2016   Procedure: INSERTION PORT-A-CATH;  Surgeon: [name], MD;  Location: [place];  Service: General;  Laterality: Right;  . TOTAL MASTECTOMY Left 05/28/2016   Procedure: LEFT  MASTECTOMY;  Surgeon: [name], MD;  Location: [place];  Service: General;  Laterality: Left;  . TUBAL LIGATION  22  yrs since  2004.   Bilateral.    SOCIAL HISTORY: Social History   Social History  . Marital status: Divorced    Spouse name: N/A  . Number of children: N/A  . Years of education: N/A   Occupational History  . Not on file.   Social History Main Topics  . Smoking status: Former Smoker    Packs/day: 0.10    Years: 7.00    Quit date: 12/01/1975  . Smokeless tobacco: Never Used  . Alcohol use No  . Drug use: No  . Sexual activity: Yes   Other Topics Concern  . Not on file   Social History Narrative  . No  narrative on file   She is retired from school   FAMILY HISTORY: Family History  Problem Relation Age of Onset  . Prostate cancer Father   . Ovarian cancer Maternal Aunt 33    deceased  . Cancer Maternal Uncle     "bone ca"; unk. primary; deceased 69s  . Pancreatic cancer Mother 75    deceased  . Cancer Maternal Aunt     2 other mat aunts; unk. primary in 62s; deceased  . Prostate cancer Maternal Uncle     deceased 6    ALLERGIES:  is allergic to codeine.  MEDICATIONS:  Current Outpatient Prescriptions  Medication Sig Dispense Refill  . aspirin 81 MG tablet Take 81 mg by mouth daily.     Marland Kitchen dexamethasone (DECADRON) 4 MG tablet Take 1 tablet (4 mg total) by mouth 2 (two) times daily. Start the day before Taxotere. Then again the day after chemo for 3 days. 30 tablet 1  . furosemide (LASIX) 20 MG tablet Take 0.5 tablets (10 mg total) by mouth daily as needed. 30 tablet 1  . glyBURIDE-metformin (GLUCOVANCE) 2.5-500 MG per tablet Take 1 tablet by mouth 2 (two) times daily with a meal.      . ibuprofen (ADVIL,MOTRIN) 200 MG tablet Take 200 mg by mouth every 8 (eight) hours as needed.      . lidocaine-prilocaine (EMLA) cream Apply to affected area once 30 g 3  . lisinopril-hydrochlorothiazide (PRINZIDE,ZESTORETIC) 20-25 MG tablet Take 1 tablet by mouth daily. 30 tablet 1  . Omega-3 Fatty Acids (FISH OIL) 1200 MG CAPS Take 1 capsule by mouth daily.    . ondansetron  (ZOFRAN) 8 MG tablet Take 1 tablet (8 mg total) by mouth 2 (two) times daily as needed for refractory nausea / vomiting. Start on day 3 after chemo. 30 tablet 1  . Potassium 99 MG TABS Take 1 tablet by mouth as needed.     . prochlorperazine (COMPAZINE) 10 MG tablet Take 1 tablet (10 mg total) by mouth every 6 (six) hours as needed (Nausea or vomiting). 30 tablet 1  . traMADol (ULTRAM) 50 MG tablet Take 2 tablets (100 mg total) by mouth every 6 (six) hours as needed for moderate pain. 20 tablet 0   No current facility-administered medications for this visit.     REVIEW OF SYSTEMS:   Constitutional: Denies fevers, chills or abnormal night sweats Eyes: Denies blurriness of vision, double vision or watery eyes Ears, nose, mouth, throat, and face: Denies mucositis or sore throat Respiratory: Denies cough, dyspnea or wheezes Cardiovascular: Denies palpitation, chest discomfort or lower extremity swelling Gastrointestinal:  Denies nausea, heartburn or change in bowel habits Skin: Denies abnormal skin rashes Lymphatics: Denies new lymphadenopathy or easy bruising Neurological:Denies numbness, tingling or new weaknesses Behavioral/Psych: Mood is stable, no new changes  All other systems were reviewed with the patient and are negative.  PHYSICAL EXAMINATION: ECOG PERFORMANCE STATUS: 0  Vitals:   08/17/16 0937  BP: (!) 144/68  Pulse: 85  Resp: 18  Temp: 98.5 F (36.9 C)   Filed Weights   08/17/16 0937  Weight: 231 lb 6.4 oz (105 kg)    GENERAL:alert, no distress and comfortable SKIN: skin color, texture, turgor are normal, no rashes or significant lesions EYES: normal, conjunctiva are pink and non-injected, sclera clear OROPHARYNX:no exudate, no erythema and lips, buccal mucosa, and tongue normal  NECK: supple, thyroid normal size, non-tender, without nodularity LYMPH:  no palpable lymphadenopathy in the cervical, axillary or inguinal LUNGS:  clear to auscultation and percussion with  normal breathing effort HEART: regular rate & rhythm and no murmurs and no lower extremity edema ABDOMEN:abdomen soft, non-tender and normal bowel sounds Musculoskeletal:no cyanosis of digits and no clubbing  PSYCH: alert & oriented x 3 with fluent speech NEURO: no focal motor/sensory deficits Breasts: Breast inspection showed left breast is surgically absent, surgical incision has healed well. Exam of the right breast and bilateral axillas reviewed no palpable mass or adenopathy. EXT:    LABORATORY DATA:  I have reviewed the data as listed CBC Latest Ref Rng & Units 08/17/2016 07/27/2016 07/14/2016  WBC 3.9 - 10.3 10e3/uL 7.8 12.5(H) 22.2(H)  Hemoglobin 11.6 - 15.9 g/dL 10.5(L) 10.9(L) 12.6  Hematocrit 34.8 - 46.6 % 31.2(L) 31.7(L) 38.5  Platelets 145 - 400 10e3/uL 206 272 267   CMP Latest Ref Rng & Units 08/17/2016 07/27/2016 07/14/2016  Glucose 70 - 140 mg/dl 165(H) 166(H) 213(H)  BUN 7.0 - 26.0 mg/dL 15.9 10.4 19.0  Creatinine 0.6 - 1.1 mg/dL 0.8 0.8 1.0  Sodium 136 - 145 mEq/L 137 141 135(L)  Potassium 3.5 - 5.1 mEq/L 4.1 3.1(L) 4.1  Chloride 101 - 111 mmol/L - - -  CO2 22 - 29 mEq/L 26 28 21(L)  Calcium 8.4 - 10.4 mg/dL 9.5 9.4 9.4  Total Protein 6.4 - 8.3 g/dL 7.5 7.3 7.9  Total Bilirubin 0.20 - 1.20 mg/dL 0.37 0.38 <0.30  Alkaline Phos 40 - 150 U/L 56 59 80  AST 5 - 34 U/L 38(H) 22 56(H)  ALT 0 - 55 U/L 52 46 78(H)     PATHOLOGY REPORT  Diagnosis 04/17/2016 Breast, left, needle core biopsy, inner mid breast - INVASIVE DUCTAL CARCINOMA WITH CALCIFICATIONS, SEE COMMENT. - DUCTAL CARCINOMA IN SITU WITH COMEDONECROSIS. Microscopic Comment While grading is best performed on the resection specimen the invasive carcinoma appears grade 2. Prognostic markers will be ordered and reported in an addendum. Dr. Lyndon Code has reviewed the case. The case was called to [facility] on 04/20/2016.  Results: HER2 - *EQUIVOCAL* (Her2 by IHC will be ordered.) RATIO OF  HER2/CEP17 SIGNALS 1.73 AVERAGE HER2 COPY NUMBER PER CELL 4.15 By immunohistochemistry, the tumor cells are positive for Her2 (3).  Results: IMMUNOHISTOCHEMICAL AND MORPHOMETRIC ANALYSIS PERFORMED MANUALLY Estrogen Receptor: 100%, POSITIVE, STRONG STAINING INTENSITY Progesterone Receptor: 15%, POSITIVE, STRONG STAINING INTENSITY Proliferation Marker Ki67: 10%  Diagnosis 05/15/2016 Lymph node, needle/core biopsy, right axilla - BENIGN REACTIVE LYMPH NODE. - NO GRANULOMAS OR MALIGNANCY IDENTIFIED. - SEE COMMENT. Microscopic Comment Internal departmental review obtained (Dr. Lyndon Code) with agreement. Results are phoned to [facility] (05/18/16). (MEG:gt, 05/18/16)   Breast, simple mastectomy, Left 05/28/2016 - INVASIVE DUCTAL CARCINOMA, GRADE III/III, SPANNING 3.6 CM. - DUCTAL CARCINOMA IN SITU, HIGH GRADE. - LYMPHOVASCULAR INVASION IS IDENTIFIED. - THE SURGICAL RESECTION MARGINS ARE NEGATIVE FOR CARCINOMA. - SEE ONCOLOGY TABLE BELOW. Microscopic Comment BREAST, INVASIVE TUMOR, WITHOUT LYMPH NODES PRESENT Specimen, including laterality : Left breast Procedure: Simple mastectomy Histologic type: Ductal with micropapillary features Grade: III Tubule formation: 3 Nuclear pleomorphism: 2 Mitotic: 3 Tumor size (gross measurement): 3.6 cm Margins: Greater than 0.2 cm to all margins Lymphovascular invasion: Present Ductal carcinoma in situ: Present Grade: High grade Extensive intraductal component: Yes Lobular neoplasia: Not identified Tumor focality: Unifocal Treatment effect: N/A Extent of tumor: Confined to breast parenchyma. Breast prognostic profile: [phone] Estrogen receptor: 100%, strong staining intensity Progesterone receptor: 15%, strong staining intensity Her 2 neu: Amplification was detected by immunohistochemistry. Ki-67:  10% 1 of 2 FINAL for ERMINIA, MCNEW ([id number]) Microscopic Comment(continued) Non-neoplastic breast: No  significant findings. TNM: pT2, pNX (clinically recurrent). Comments: In addition to the main tumor, there is ductal carcinoma in situ present in random tissue submitted from the inferior lateral quadrant. (JBK:kh 06-01-16)   RADIOGRAPHIC STUDIES: I have personally reviewed the radiological images as listed and agreed with the findings in the report. No results found.  ASSESSMENT & PLAN:  65 year old African-American female, postmenopausal, history of left breast triple negative cancer in 2003, presented with mammogram discovered left breast cancer, triple positive.  1. Cancer of central portion of left breast, pT2NxM0, stage IIA, G3, triple positive -I reviewed her surgical pathology findings in great details with patient and her family member. It showed a 3.6 cm mass, grade 3, surgical margins were negative. She had no sentinel lymph node biopsy due to her prior history of axillary lymph node dissection. -Her restaging CT scan of bone scan showed no evidence of distant metastasis, I reviewed with patient. -We discussed the risk of cancer recurrence of this complete surgical resection. We reviewed that HER-2 positive with cancers are more aggressive, with increased risk of metastasis.  -Given the moderate to high risk of cancer recurrence, I recommend adjuvant chemotherapy to reduce her risk of cancer recurrence. I recommend chemotherapy and duo anti-HER-2 agents TCHP (docetaxel, carbotaxol, Herceptin, pejeta), every 3 weeks for total of 6 cycles, followed by maintenance Herceptin every 3 weeks to complete 1 year treatment. -She has been tolerating chemo well overall so far  -Lab results reviewed with her, mild anemia is stable, WBC and platelet counts are normal, adequate for treatment, we'll proceed with third cycle chemotherapy today, no Neulasta.  2. DM and HTN  -She'll continue follow-up with her primary care physician -We discussed her blood pressure and glucose level may be impacted by  chemotherapy, and dexamethasone she takes before and after chemotherapy. -I strongly encouraged her to monitor her blood pressure and glucose at home, and consider increasing the dose of metformin and glyburide if needed -her BP and blood glucose has been normal/well controlled lately   3. Obesity  -I encouraged her to eat healthy and exercise regularly, she is willing to lose some weight after she completes chemotherapy.  Plan -Third cycle chemotherapy TCHP today, no neulasta -RTC in 3 weeks for cycle 4   All questions were answered. The patient knows to call the clinic with any problems, questions or concerns.  I spent 20 minutes counseling the patient face to face. The total time spent in the appointment was 25 minutes and more than 50% was on counseling.     [name], MD 08/17/2016

## 2016-08-17 NOTE — Progress Notes (Signed)
Nutrition follow-up completed with patient during infusion for recurrent breast cancer. Weight improved documented as 231 pounds up from 229.1 pounds. Patient denies nausea, vomiting, and mouth sores. Reports difficulty eating secondary to food not having any taste. Patient reports she can taste [place] salad dressing and pickles;  Nutrition diagnosis: Inadequate oral intake has improved.  Intervention:  Educated patient on strategies for improving taste. Provided fact sheet. Reviewed strategies for eating with diarrhea. Questions were answered.  Teach back method used.  Monitoring, evaluation, goals:  Patient will tolerate adequate calories and protein to promote maintenance of lean body mass.  Next visit: To be scheduled as needed.  **Disclaimer: This note was dictated with voice recognition software. Similar sounding words can inadvertently be transcribed and this note may contain transcription errors which may not have been corrected upon publication of note.**

## 2016-09-07 ENCOUNTER — Telehealth: Payer: Self-pay | Admitting: Hematology

## 2016-09-07 ENCOUNTER — Encounter: Payer: Self-pay | Admitting: Hematology

## 2016-09-07 ENCOUNTER — Ambulatory Visit: Payer: Medicare PPO

## 2016-09-07 ENCOUNTER — Other Ambulatory Visit (HOSPITAL_BASED_OUTPATIENT_CLINIC_OR_DEPARTMENT_OTHER): Payer: Medicare PPO

## 2016-09-07 ENCOUNTER — Ambulatory Visit (HOSPITAL_BASED_OUTPATIENT_CLINIC_OR_DEPARTMENT_OTHER): Payer: Medicare PPO

## 2016-09-07 ENCOUNTER — Ambulatory Visit (HOSPITAL_BASED_OUTPATIENT_CLINIC_OR_DEPARTMENT_OTHER): Payer: Medicare PPO | Admitting: Hematology

## 2016-09-07 VITALS — BP 151/68 | HR 87 | Temp 98.4°F | Resp 18 | Ht 69.0 in | Wt 231.5 lb

## 2016-09-07 DIAGNOSIS — C50112 Malignant neoplasm of central portion of left female breast: Secondary | ICD-10-CM | POA: Diagnosis not present

## 2016-09-07 DIAGNOSIS — Z170 Estrogen receptor positive status [ER+]: Secondary | ICD-10-CM | POA: Diagnosis not present

## 2016-09-07 DIAGNOSIS — Z23 Encounter for immunization: Secondary | ICD-10-CM

## 2016-09-07 DIAGNOSIS — E119 Type 2 diabetes mellitus without complications: Secondary | ICD-10-CM

## 2016-09-07 DIAGNOSIS — Z5111 Encounter for antineoplastic chemotherapy: Secondary | ICD-10-CM

## 2016-09-07 DIAGNOSIS — Z5112 Encounter for antineoplastic immunotherapy: Secondary | ICD-10-CM

## 2016-09-07 DIAGNOSIS — I10 Essential (primary) hypertension: Secondary | ICD-10-CM | POA: Diagnosis not present

## 2016-09-07 DIAGNOSIS — Z95828 Presence of other vascular implants and grafts: Secondary | ICD-10-CM

## 2016-09-07 DIAGNOSIS — Z853 Personal history of malignant neoplasm of breast: Secondary | ICD-10-CM

## 2016-09-07 DIAGNOSIS — E669 Obesity, unspecified: Secondary | ICD-10-CM

## 2016-09-07 LAB — CBC WITH DIFFERENTIAL/PLATELET
BASO%: 0.2 % (ref 0.0–2.0)
Basophils Absolute: 0 10*3/uL (ref 0.0–0.1)
EOS%: 0.2 % (ref 0.0–7.0)
Eosinophils Absolute: 0 10*3/uL (ref 0.0–0.5)
HCT: 27.4 % — ABNORMAL LOW (ref 34.8–46.6)
HGB: 9.3 g/dL — ABNORMAL LOW (ref 11.6–15.9)
LYMPH%: 36.9 % (ref 14.0–49.7)
MCH: 33.5 pg (ref 25.1–34.0)
MCHC: 34 g/dL (ref 31.5–36.0)
MCV: 98.7 fL (ref 79.5–101.0)
MONO#: 0.6 10*3/uL (ref 0.1–0.9)
MONO%: 7.1 % (ref 0.0–14.0)
NEUT#: 5 10*3/uL (ref 1.5–6.5)
NEUT%: 55.6 % (ref 38.4–76.8)
Platelets: 155 10*3/uL (ref 145–400)
RBC: 2.77 10*6/uL — ABNORMAL LOW (ref 3.70–5.45)
RDW: 15.8 % — ABNORMAL HIGH (ref 11.2–14.5)
WBC: 9 10*3/uL (ref 3.9–10.3)
lymph#: 3.3 10*3/uL (ref 0.9–3.3)

## 2016-09-07 LAB — COMPREHENSIVE METABOLIC PANEL
ALT: 40 U/L (ref 0–55)
AST: 24 U/L (ref 5–34)
Albumin: 3.4 g/dL — ABNORMAL LOW (ref 3.5–5.0)
Alkaline Phosphatase: 43 U/L (ref 40–150)
Anion Gap: 12 mEq/L — ABNORMAL HIGH (ref 3–11)
BUN: 15.7 mg/dL (ref 7.0–26.0)
CO2: 30 mEq/L — ABNORMAL HIGH (ref 22–29)
Calcium: 9.4 mg/dL (ref 8.4–10.4)
Chloride: 98 mEq/L (ref 98–109)
Creatinine: 0.7 mg/dL (ref 0.6–1.1)
EGFR: >90 mL/min/{1.73_m2} (ref >90)
Glucose: 94 mg/dl (ref 70–140)
Potassium: 3.3 mEq/L — ABNORMAL LOW (ref 3.5–5.1)
Sodium: 140 mEq/L (ref 136–145)
Total Bilirubin: 0.42 mg/dL (ref 0.20–1.20)
Total Protein: 7.2 g/dL (ref 6.4–8.3)

## 2016-09-07 MED ORDER — DIPHENHYDRAMINE HCL 25 MG PO CAPS
50.0000 mg | ORAL_CAPSULE | Freq: Once | ORAL | Status: AC
  Administered 2016-09-07: 50 mg via ORAL

## 2016-09-07 MED ORDER — SODIUM CHLORIDE 0.9 % IJ SOLN
10.0000 mL | INTRAMUSCULAR | Status: DC | PRN
  Administered 2016-09-07: 10 mL via INTRAVENOUS
  Filled 2016-09-07: qty 10

## 2016-09-07 MED ORDER — ACETAMINOPHEN 325 MG PO TABS
ORAL_TABLET | ORAL | Status: AC
  Filled 2016-09-07: qty 2

## 2016-09-07 MED ORDER — HEPARIN SOD (PORK) LOCK FLUSH 100 UNIT/ML IV SOLN
500.0000 [IU] | Freq: Once | INTRAVENOUS | Status: AC | PRN
  Administered 2016-09-07: 500 [IU]
  Filled 2016-09-07: qty 5

## 2016-09-07 MED ORDER — SODIUM CHLORIDE 0.9 % IV SOLN
75.0000 mg/m2 | Freq: Once | INTRAVENOUS | Status: AC
  Administered 2016-09-07: 170 mg via INTRAVENOUS
  Filled 2016-09-07: qty 17

## 2016-09-07 MED ORDER — PALONOSETRON HCL INJECTION 0.25 MG/5ML
INTRAVENOUS | Status: AC
  Filled 2016-09-07: qty 5

## 2016-09-07 MED ORDER — TRASTUZUMAB CHEMO 150 MG IV SOLR
6.0000 mg/kg | Freq: Once | INTRAVENOUS | Status: AC
  Administered 2016-09-07: 630 mg via INTRAVENOUS
  Filled 2016-09-07: qty 30

## 2016-09-07 MED ORDER — INFLUENZA VAC SPLIT QUAD 0.5 ML IM SUSY
0.5000 mL | PREFILLED_SYRINGE | Freq: Once | INTRAMUSCULAR | Status: AC
  Administered 2016-09-07: 0.5 mL via INTRAMUSCULAR
  Filled 2016-09-07: qty 0.5

## 2016-09-07 MED ORDER — SODIUM CHLORIDE 0.9% FLUSH
10.0000 mL | INTRAVENOUS | Status: DC | PRN
  Administered 2016-09-07: 10 mL
  Filled 2016-09-07: qty 10

## 2016-09-07 MED ORDER — SODIUM CHLORIDE 0.9 % IV SOLN
Freq: Once | INTRAVENOUS | Status: AC
  Administered 2016-09-07: 10:00:00 via INTRAVENOUS

## 2016-09-07 MED ORDER — ACETAMINOPHEN 325 MG PO TABS
650.0000 mg | ORAL_TABLET | Freq: Once | ORAL | Status: AC
  Administered 2016-09-07: 650 mg via ORAL

## 2016-09-07 MED ORDER — SODIUM CHLORIDE 0.9 % IV SOLN
10.0000 mg | Freq: Once | INTRAVENOUS | Status: AC
  Administered 2016-09-07: 10 mg via INTRAVENOUS
  Filled 2016-09-07: qty 1

## 2016-09-07 MED ORDER — SODIUM CHLORIDE 0.9 % IV SOLN
420.0000 mg | Freq: Once | INTRAVENOUS | Status: AC
  Administered 2016-09-07: 420 mg via INTRAVENOUS
  Filled 2016-09-07: qty 14

## 2016-09-07 MED ORDER — PALONOSETRON HCL INJECTION 0.25 MG/5ML
0.2500 mg | Freq: Once | INTRAVENOUS | Status: AC
  Administered 2016-09-07: 0.25 mg via INTRAVENOUS

## 2016-09-07 MED ORDER — SODIUM CHLORIDE 0.9 % IV SOLN
706.2000 mg | Freq: Once | INTRAVENOUS | Status: AC
  Administered 2016-09-07: 710 mg via INTRAVENOUS
  Filled 2016-09-07: qty 71

## 2016-09-07 MED ORDER — PEGFILGRASTIM 6 MG/0.6ML ~~LOC~~ PSKT: 6.0000 mg | PREFILLED_SYRINGE | Freq: Once | SUBCUTANEOUS | Status: DC

## 2016-09-07 MED ORDER — POTASSIUM CHLORIDE ER 20 MEQ PO TBCR: 20.0000 meq | EXTENDED_RELEASE_TABLET | Freq: Every day | ORAL | 1 refills | Status: DC

## 2016-09-07 MED ORDER — DIPHENHYDRAMINE HCL 25 MG PO CAPS
ORAL_CAPSULE | ORAL | Status: AC
  Filled 2016-09-07: qty 2

## 2016-09-07 NOTE — Progress Notes (Signed)
[name]  [phone] [phone]  Clinic Follow Up Note   Patient Care Team: [name], NP as PCP - General (Internal Medicine) [name], MD as Consulting Physician (General Surgery) 09/07/2016   CHIEF COMPLAINTS:  Follow up recurrent left breast cancer   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 [facility])   Staging form: Breast, AJCC 7th Edition     Clinical stage from 04/17/2016: Stage IA (T1c, N0, M0) - Signed by [name], MD on 05/07/2016     Pathologic stage from 05/28/2016: Stage IIA (T2, N0, cM0) - Signed by [name], MD on 06/11/2016 History of left breast cancer   Staging form: Breast, AJCC 7th Edition     Clinical: Stage IIIB (T4d, N1, cM0) - Signed by [name], MD on 12/14/2013     Pathologic: Stage IIIB (T4d, N1a, cM0) - Signed by [name], MD on 12/14/2013       History of left breast cancer   09/12/2002 Imaging    CT scan show no evidence of disease apart from the left axilla      09/2002 -  Neo-Adjuvant Chemotherapy    TAC X4 cycles       01/10/2003 Surgery    The patient had left lumpectomy and axillary lymph node dissection which show residual breast cancer and a sentinel lymph node was positive      2004 -  Adjuvant Chemotherapy    Cytoxan with 5-FU x4 cycles (methotrexate added at cycle 4).      2004 -  Radiation Therapy    Adjuvant left breast radiation      12/2002 Receptors her2    ER, PR and HER-2 were all negative.       Cancer of central portion of left breast ([place])   04/13/2016 Mammogram    Scheduler coarse heterogeneous calcifications spanning an area of 3.7 cm in the lumpectomy site, indeterminate. Ultrasound of the left axilla was negative.      04/17/2016 Initial Diagnosis    Cancer of central portion of left breast ([place])      04/17/2016 Initial Biopsy    Left breast core needle biopsy showed invasive and in situ ductal carcinoma with calcification, grade 2.      04/17/2016 Receptors her2    Your 100% positive, PR 15% positive, strong staining, Ki-67 10%, HER-2 positive by IHC (3)      05/05/2016 Imaging    Bilateral breast MRI showed a 1.3 x 0.8 x 0.7 cm biopsy-proven ductal carcinoma in the region of the previous lumpectomy. Enlarged lobulated right inferior axillary lymph node with diffuse cortical thickening, biopsy is recommended.      05/15/2016 Pathology Results    right axilary node biopsy was negative for malignant cell       05/28/2016 Surgery    Simple left mastectomy      05/28/2016 Pathology Results    Left mastectomy showed invasive ductal carcinoma, grade 3, 3.6 cm, high grade DCIS, (+) LVI, surgical margins were negative. Tumor 3.6 cm, no lymph nodes identified.      06/25/2016 Imaging    Staging CT scan of the chest, abdomen and pelvis with contrast and a bone scan showed no evidence of metastasis. Postoperative fluid collection in the left breast, likely a seroma or hematoma. Right axillary adenopathy, which was biopsied previously.       07/07/2016 -  Chemotherapy    Adjuvant chemotherapy with docetaxel, carboplatin, Herceptin and pejeta  every 3 weeks, for 6 cycles, followed by Herceptin maintenance therapy for total of one year treatment         HISTORY OF PRESENTING ILLNESS:  Lindsey Marquez 65 y.o. female is here because of her recurrent left breast cancer.  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 She was seen by multiple medical oncologist in our practice in the past, last was seen by Dr. Alvy Bimler in 11/2013.  She has been doing well, and is compliant with annual screening mammogram. The screening mammogram on 04/17/2016 showed calcification in the left breast, and core needle biopsy showed invasive and in situ ductal carcinoma, grade 2, ER/PR positive, HER-2 amplified. She was referred to  seeing breast surgeon Dr. Georgette Dover and referred back to [place].   She feels well, she did not feel any lump in her breast or axillas latley. She denies any pain, dyspnea, or GI symptoms. She had right nipple rash and right axillary swelling in the past one week, after she drinks some diet tea with citric. Breast MRI showed an enlarged lobulated right inferior axillary lymph node with diffuse cortical thickening, in addition to the known left breast mass which measured 1.3 cm on MRI.  GYN HISTORY  Menarchal: 12 LMP: 2003 Contraceptive: 20 HRT: n/a  G2P2: 53 yo Son and 26 yo son who died   CURRENT THERAPY: adjuvant chemotherapy TCHP every 3 weeks, started on 07/07/2016  INTERIM HISTORY: [name] returns for follow up and 4th cycle chemo.  She is doing well overall, tolerating chemotherapy very well. She has noticed some numbness on her tip of fingers daily, and sometime difficult becomes more things. Her hand functions otherwise normal. No significant tingling or pain. No numbness on her toes. She otherwise denies any significant nausea, diarrhea, fatigue, or other side effects from chemotherapy. She does notice her leg swelling has been slightly worse after chemotherapy, she does take Lasix on a daily basis.  MEDICAL HISTORY:  Past Medical History:  Diagnosis Date  . Anxiety   . Breast cancer [facility]) August 2002   Invasive ductal carcinoma Left breast.  . Diabetes mellitus without complication ([facility])   . Family history of malignant neoplasm of ovary   . Family history of pancreatic cancer   . Hypertension     SURGICAL HISTORY: Past Surgical History:  Procedure Laterality Date  . BREAST SURGERY Left 2003  . FOOT SURGERY  2000  . MASTECTOMY Left 05/28/2016  . port a cath insertion  05/28/2016  . PORTACATH PLACEMENT Right 05/28/2016   Procedure: INSERTION PORT-A-CATH;  Surgeon: [name], MD;  Location: [facility];  Service: General;  Laterality: Right;  . TOTAL MASTECTOMY Left 05/28/2016    Procedure: LEFT  MASTECTOMY;  Surgeon: [name], MD;  Location: [place] [facility];  Service: General;  Laterality: Left;  . TUBAL LIGATION  22 yrs since  2004.   Bilateral.    SOCIAL HISTORY: Social History   Social History  . Marital status: Divorced    Spouse name: N/A  . Number of children: N/A  . Years of education: N/A   Occupational History  . Not on file.   Social History Main Topics  . Smoking status: Former Smoker    Packs/day: 0.10    Years: 7.00    Quit date: 12/01/1975  . Smokeless tobacco: Never Used  . Alcohol use No  . Drug use: No  . Sexual  activity: Yes   Other Topics Concern  . Not on file   Social History Narrative  . No narrative on file   She is retired from school   FAMILY HISTORY: Family History  Problem Relation Age of Onset  . Prostate cancer Father   . Ovarian cancer Maternal Aunt 35    deceased  . Cancer Maternal Uncle     "bone ca"; unk. primary; deceased 18s  . Pancreatic cancer Mother 13    deceased  . Cancer Maternal Aunt     2 other mat aunts; unk. primary in 40s; deceased  . Prostate cancer Maternal Uncle     deceased 62    ALLERGIES:  is allergic to codeine.  MEDICATIONS:  Current Outpatient Prescriptions  Medication Sig Dispense Refill  . aspirin 81 MG tablet Take 81 mg by mouth daily.     Marland Kitchen dexamethasone (DECADRON) 4 MG tablet Take 1 tablet (4 mg total) by mouth 2 (two) times daily. Start the day before Taxotere. Then again the day after chemo for 3 days. 30 tablet 1  . furosemide (LASIX) 20 MG tablet Take 0.5 tablets (10 mg total) by mouth daily as needed. 30 tablet 1  . glyBURIDE-metformin (GLUCOVANCE) 2.5-500 MG per tablet Take 1 tablet by mouth 2 (two) times daily with a meal.      . ibuprofen (ADVIL,MOTRIN) 200 MG tablet Take 200 mg by mouth every 8 (eight) hours as needed.      . lidocaine-prilocaine (EMLA) cream Apply to affected area once 30 g 3  . lisinopril-hydrochlorothiazide (PRINZIDE,ZESTORETIC) 20-25 MG tablet Take  1 tablet by mouth daily. 30 tablet 1  . Omega-3 Fatty Acids (FISH OIL) 1200 MG CAPS Take 1 capsule by mouth daily.    . ondansetron (ZOFRAN) 8 MG tablet Take 1 tablet (8 mg total) by mouth 2 (two) times daily as needed for refractory nausea / vomiting. Start on day 3 after chemo. 30 tablet 1  . Potassium 99 MG TABS Take 1 tablet by mouth as needed.     . prochlorperazine (COMPAZINE) 10 MG tablet Take 1 tablet (10 mg total) by mouth every 6 (six) hours as needed (Nausea or vomiting). 30 tablet 1  . traMADol (ULTRAM) 50 MG tablet Take 2 tablets (100 mg total) by mouth every 6 (six) hours as needed for moderate pain. 20 tablet 0  . Potassium Chloride ER 20 MEQ TBCR Take 20 mEq by mouth daily. 30 tablet 1   No current facility-administered medications for this visit.    Facility-Administered Medications Ordered in Other Visits  Medication Dose Route Frequency Provider Last Rate Last Dose  . CARBOplatin (PARAPLATIN) 710 mg in sodium chloride 0.9 % 250 mL chemo infusion  710 mg Intravenous Once [name], MD      . DOCEtaxel (TAXOTERE) 170 mg in sodium chloride 0.9 % 250 mL chemo infusion  75 mg/m2 (Treatment Plan Recorded) Intravenous Once [name], MD      . heparin lock flush 100 unit/mL  500 Units Intracatheter Once PRN [name], MD      . Influenza vac split quadrivalent PF (FLUARIX) injection 0.5 mL  0.5 mL Intramuscular Once [name], MD      . pertuzumab (PERJETA) 420 mg in sodium chloride 0.9 % 250 mL chemo infusion  420 mg Intravenous Once [name], MD      . sodium chloride flush (NS) 0.9 % injection 10 mL  10 mL Intracatheter PRN [name], MD      .  trastuzumab (HERCEPTIN) 630 mg in sodium chloride 0.9 % 250 mL chemo infusion  6 mg/kg (Treatment Plan Recorded) Intravenous Once [name], MD 560 mL/hr at 09/07/16 1106 630 mg at 09/07/16 1106    REVIEW OF SYSTEMS:   Constitutional: Denies fevers, chills or abnormal night sweats Eyes: Denies blurriness of vision, double vision or watery  eyes Ears, nose, mouth, throat, and face: Denies mucositis or sore throat Respiratory: Denies cough, dyspnea or wheezes Cardiovascular: Denies palpitation, chest discomfort or lower extremity swelling Gastrointestinal:  Denies nausea, heartburn or change in bowel habits Skin: Denies abnormal skin rashes Lymphatics: Denies new lymphadenopathy or easy bruising Neurological:Denies numbness, tingling or new weaknesses Behavioral/Psych: Mood is stable, no new changes  All other systems were reviewed with the patient and are negative.  PHYSICAL EXAMINATION: ECOG PERFORMANCE STATUS: 1  Vitals:   09/07/16 0921  BP: (!) 151/68  Pulse: 87  Resp: 18  Temp: 98.4 F (36.9 C)   Filed Weights   09/07/16 0921  Weight: 231 lb 8 oz (105 kg)    GENERAL:alert, no distress and comfortable SKIN: skin color, texture, turgor are normal, no rashes or significant lesions EYES: normal, conjunctiva are pink and non-injected, sclera clear OROPHARYNX:no exudate, no erythema and lips, buccal mucosa, and tongue normal  NECK: supple, thyroid normal size, non-tender, without nodularity LYMPH:  no palpable lymphadenopathy in the cervical, axillary or inguinal LUNGS: clear to auscultation and percussion with normal breathing effort HEART: regular rate & rhythm and no murmurs and no lower extremity edema ABDOMEN:abdomen soft, non-tender and normal bowel sounds Musculoskeletal:no cyanosis of digits and no clubbing  PSYCH: alert & oriented x 3 with fluent speech NEURO: no focal motor/sensory deficits, (+) decreased vibration sensation on both hands, no weakness. Breasts: Breast inspection showed left breast is surgically absent, surgical incision has healed well. Exam of the right breast and bilateral axillas reviewed no palpable mass or adenopathy. EXT:    LABORATORY DATA:  I have reviewed the data as listed CBC Latest Ref Rng & Units 09/07/2016 08/17/2016 07/27/2016  WBC 3.9 - 10.3 10e3/uL 9.0 7.8 12.5(H)   Hemoglobin 11.6 - 15.9 g/dL 9.3(L) 10.5(L) 10.9(L)  Hematocrit 34.8 - 46.6 % 27.4(L) 31.2(L) 31.7(L)  Platelets 145 - 400 10e3/uL 155 206 272   CMP Latest Ref Rng & Units 09/07/2016 08/17/2016 07/27/2016  Glucose 70 - 140 mg/dl 94 165(H) 166(H)  BUN 7.0 - 26.0 mg/dL 15.7 15.9 10.4  Creatinine 0.6 - 1.1 mg/dL 0.7 0.8 0.8  Sodium 136 - 145 mEq/L 140 137 141  Potassium 3.5 - 5.1 mEq/L 3.3(L) 4.1 3.1(L)  Chloride 101 - 111 mmol/L - - -  CO2 22 - 29 mEq/L 30(H) 26 28  Calcium 8.4 - 10.4 mg/dL 9.4 9.5 9.4  Total Protein 6.4 - 8.3 g/dL 7.2 7.5 7.3  Total Bilirubin 0.20 - 1.20 mg/dL 0.42 0.37 0.38  Alkaline Phos 40 - 150 U/L 43 56 59  AST 5 - 34 U/L 24 38(H) 22  ALT 0 - 55 U/L 40 52 46     PATHOLOGY REPORT  Diagnosis 04/17/2016 Breast, left, needle core biopsy, inner mid breast - INVASIVE DUCTAL CARCINOMA WITH CALCIFICATIONS, SEE COMMENT. - DUCTAL CARCINOMA IN SITU WITH COMEDONECROSIS. Microscopic Comment While grading is best performed on the resection specimen the invasive carcinoma appears grade 2. Prognostic markers will be ordered and reported in an addendum. Dr. Lyndon Code has reviewed the case. The case was called to [facility] on 04/20/2016.  Results: HER2 - *  EQUIVOCAL* (Her2 by IHC will be ordered.) RATIO OF HER2/CEP17 SIGNALS 1.73 AVERAGE HER2 COPY NUMBER PER CELL 4.15 By immunohistochemistry, the tumor cells are positive for Her2 (3).  Results: IMMUNOHISTOCHEMICAL AND MORPHOMETRIC ANALYSIS PERFORMED MANUALLY Estrogen Receptor: 100%, POSITIVE, STRONG STAINING INTENSITY Progesterone Receptor: 15%, POSITIVE, STRONG STAINING INTENSITY Proliferation Marker Ki67: 10%  Diagnosis 05/15/2016 Lymph node, needle/core biopsy, right axilla - BENIGN REACTIVE LYMPH NODE. - NO GRANULOMAS OR MALIGNANCY IDENTIFIED. - SEE COMMENT. Microscopic Comment Internal departmental review obtained (Dr. Lyndon Code) with agreement. Results are phoned to [place] (05/18/16). (MEG:gt, 05/18/16)   Breast, simple mastectomy, Left 05/28/2016 - INVASIVE DUCTAL CARCINOMA, GRADE III/III, SPANNING 3.6 CM. - DUCTAL CARCINOMA IN SITU, HIGH GRADE. - LYMPHOVASCULAR INVASION IS IDENTIFIED. - THE SURGICAL RESECTION MARGINS ARE NEGATIVE FOR CARCINOMA. - SEE ONCOLOGY TABLE BELOW. Microscopic Comment BREAST, INVASIVE TUMOR, WITHOUT LYMPH NODES PRESENT Specimen, including laterality : Left breast Procedure: Simple mastectomy Histologic type: Ductal with micropapillary features Grade: III Tubule formation: 3 Nuclear pleomorphism: 2 Mitotic: 3 Tumor size (gross measurement): 3.6 cm Margins: Greater than 0.2 cm to all margins Lymphovascular invasion: Present Ductal carcinoma in situ: Present Grade: High grade Extensive intraductal component: Yes Lobular neoplasia: Not identified Tumor focality: Unifocal Treatment effect: N/A Extent of tumor: Confined to breast parenchyma. Breast prognostic profile: [phone] Estrogen receptor: 100%, strong staining intensity Progesterone receptor: 15%, strong staining intensity Her 2 neu: Amplification was detected by immunohistochemistry. Ki-67: 10% 1 of 2 FINAL for TERASA, ORSINI ([id number]) Microscopic Comment(continued) Non-neoplastic breast: No significant findings. TNM: pT2, pNX (clinically recurrent). Comments: In addition to the main tumor, there is ductal carcinoma in situ present in random tissue submitted from the inferior lateral quadrant. (JBK:kh 06-01-16)   RADIOGRAPHIC STUDIES: I have personally reviewed the radiological images as listed and agreed with the findings in the report. No results found.  ASSESSMENT & PLAN:  65 year old African-American female, postmenopausal, history of left breast triple negative cancer in 2003, presented with mammogram discovered left breast cancer, triple positive.  1. Cancer of central portion of left breast, pT2NxM0, stage IIA, G3, triple positive -I  reviewed her surgical pathology findings in great details with patient and her family member. It showed a 3.6 cm mass, grade 3, surgical margins were negative. She had no sentinel lymph node biopsy due to her prior history of axillary lymph node dissection. -Her restaging CT scan of bone scan showed no evidence of distant metastasis, I reviewed with patient. -We discussed the risk of cancer recurrence of this complete surgical resection. We reviewed that HER-2 positive with cancers are more aggressive, with increased risk of metastasis.  -Given the moderate to high risk of cancer recurrence, I recommend adjuvant chemotherapy to reduce her risk of cancer recurrence. I recommend chemotherapy and duo anti-HER-2 agents TCHP (docetaxel, carbotaxol, Herceptin, pejeta), every 3 weeks for total of 6 cycles, followed by maintenance Herceptin every 3 weeks to complete 1 year treatment. -She has been tolerating chemo well overall so far  -She has developed peripheral neuropathy, secondary to chemotherapy, we'll monitor closely. -Lab results reviewed with her, mild anemia is stable, WBC and platelet counts are normal, adequate for treatment, we'll proceed with 4th cycle chemotherapy today, no Neulasta.  2. DM and HTN  -She'll continue follow-up with her primary care physician -We discussed her blood pressure and glucose level may be impacted by chemotherapy, and dexamethasone she takes before and after chemotherapy. -I strongly encouraged her to monitor her blood pressure and glucose at home,  and consider increasing the dose of metformin and glyburide if needed -her BP and blood glucose has been normal/well controlled lately   3. Obesity  -I encouraged her to eat healthy and exercise regularly, she is willing to lose some weight after she completes chemotherapy.  4. 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 She does not feel she  needs medication for now. -We'll continue monitoring closely, if it gets worse, we'll consider dose reduction of docetaxel  Plan -4th cycle chemotherapy TCHP today, no neulasta -RTC in 3 weeks for cycle 5  All questions were answered. The patient knows to call the clinic with any problems, questions or concerns.  I spent 20 minutes counseling the patient face to face. The total time spent in the appointment was 25 minutes and more than 50% was on counseling.     [name], MD 09/07/2016

## 2016-09-07 NOTE — Patient Instructions (Addendum)
[facility] Discharge Instructions for Patients Receiving Chemotherapy  Today you received the following chemotherapy agents Herceptin/Perjeta/Taxotere/Carboplatin   To help prevent nausea and vomiting after your treatment, we encourage you to take your nausea medication as prescribed.   If you develop nausea and vomiting that is not controlled by your nausea medication, call the clinic.   BELOW ARE SYMPTOMS THAT SHOULD BE REPORTED IMMEDIATELY:  *FEVER GREATER THAN 100.5 F  *CHILLS WITH OR WITHOUT FEVER  NAUSEA AND VOMITING THAT IS NOT CONTROLLED WITH YOUR NAUSEA MEDICATION  *UNUSUAL SHORTNESS OF BREATH  *UNUSUAL BRUISING OR BLEEDING  TENDERNESS IN MOUTH AND THROAT WITH OR WITHOUT PRESENCE OF ULCERS  *URINARY PROBLEMS  *BOWEL PROBLEMS  UNUSUAL RASH Items with * indicate a potential emergency and should be followed up as soon as possible.  Feel free to call the clinic you have any questions or concerns. The clinic phone number is [phone].  Please show the [facility] at check-in to the Emergency Department and triage nurse.   Influenza Virus Vaccine injection What is this medicine? INFLUENZA VIRUS VACCINE (in floo EN zuh VAHY ruhs vak SEEN) helps to reduce the risk of getting influenza also known as the flu. The vaccine only helps protect you against some strains of the flu. This medicine may be used for other purposes; ask your health care provider or pharmacist if you have questions. What should I tell my health care provider before I take this medicine? They need to know if you have any of these conditions: -bleeding disorder like hemophilia -fever or infection -Guillain-Barre syndrome or other neurological problems -immune system problems -infection with the human immunodeficiency virus (HIV) or AIDS -low blood platelet counts -multiple sclerosis -an unusual or allergic reaction to influenza virus vaccine, latex, other medicines, foods,  dyes, or preservatives. Different brands of vaccines contain different allergens. Some may contain latex or eggs. Talk to your doctor about your allergies to make sure that you get the right vaccine. -pregnant or trying to get pregnant -breast-feeding How should I use this medicine? This vaccine is for injection into a muscle or under the skin. It is given by a health care professional. A copy of Vaccine Information Statements will be given before each vaccination. Read this sheet carefully each time. The sheet may change frequently. Talk to your healthcare provider to see which vaccines are right for you. Some vaccines should not be used in all age groups. Overdosage: If you think you have taken too much of this medicine contact a poison control center or emergency room at once. NOTE: This medicine is only for you. Do not share this medicine with others. What if I miss a dose? This does not apply. What may interact with this medicine? -chemotherapy or radiation therapy -medicines that lower your immune system like etanercept, anakinra, infliximab, and adalimumab -medicines that treat or prevent blood clots like warfarin -phenytoin -steroid medicines like prednisone or cortisone -theophylline -vaccines This list may not describe all possible interactions. Give your health care provider a list of all the medicines, herbs, non-prescription drugs, or dietary supplements you use. Also tell them if you smoke, drink alcohol, or use illegal drugs. Some items may interact with your medicine. What should I watch for while using this medicine? Report any side effects that do not go away within 3 days to your doctor or health care professional. Call your health care provider if any unusual symptoms occur within 6 weeks of receiving this vaccine. You may  still catch the flu, but the illness is not usually as bad. You cannot get the flu from the vaccine. The vaccine will not protect against colds or other  illnesses that may cause fever. The vaccine is needed every year. What side effects may I notice from receiving this medicine? Side effects that you should report to your doctor or health care professional as soon as possible: -allergic reactions like skin rash, itching or hives, swelling of the face, lips, or tongue Side effects that usually do not require medical attention (report to your doctor or health care professional if they continue or are bothersome): -fever -headache -muscle aches and pains -pain, tenderness, redness, or swelling at the injection site -tiredness This list may not describe all possible side effects. Call your doctor for medical advice about side effects. You may report side effects to FDA at 1-800-FDA-1088. Where should I keep my medicine? The vaccine will be given by a health care professional in a clinic, pharmacy, doctor's office, or other health care setting. You will not be given vaccine doses to store at home. NOTE: This sheet is a summary. It may not cover all possible information. If you have questions about this medicine, talk to your doctor, pharmacist, or health care provider.    2016, Elsevier/Gold Standard. (2015-06-07 10:07:28)

## 2016-09-07 NOTE — Telephone Encounter (Signed)
No los/orders/referrals

## 2016-09-16 ENCOUNTER — Telehealth (HOSPITAL_COMMUNITY): Payer: Self-pay | Admitting: Vascular Surgery

## 2016-09-16 NOTE — Telephone Encounter (Signed)
Left pt message to make f/u appt w/ echo 

## 2016-09-24 ENCOUNTER — Other Ambulatory Visit: Payer: Self-pay | Admitting: Hematology

## 2016-09-24 ENCOUNTER — Ambulatory Visit (INDEPENDENT_AMBULATORY_CARE_PROVIDER_SITE_OTHER): Payer: Medicare PPO | Admitting: Internal Medicine

## 2016-09-24 ENCOUNTER — Encounter: Payer: Self-pay | Admitting: Internal Medicine

## 2016-09-24 VITALS — BP 138/66 | HR 78 | Temp 98.4°F | Ht 69.0 in | Wt 233.5 lb

## 2016-09-24 DIAGNOSIS — C50912 Malignant neoplasm of unspecified site of left female breast: Secondary | ICD-10-CM

## 2016-09-24 DIAGNOSIS — C50112 Malignant neoplasm of central portion of left female breast: Secondary | ICD-10-CM

## 2016-09-24 DIAGNOSIS — E119 Type 2 diabetes mellitus without complications: Secondary | ICD-10-CM | POA: Diagnosis not present

## 2016-09-24 DIAGNOSIS — I10 Essential (primary) hypertension: Secondary | ICD-10-CM

## 2016-09-24 DIAGNOSIS — Z0000 Encounter for general adult medical examination without abnormal findings: Secondary | ICD-10-CM

## 2016-09-24 LAB — LIPID PANEL
Cholesterol: 194 mg/dL (ref 0–200)
HDL: 79.1 mg/dL (ref >39.00)
LDL Cholesterol: 91 mg/dL (ref 0–99)
NonHDL: 114.77
Total CHOL/HDL Ratio: 2
Triglycerides: 120 mg/dL (ref 0.0–149.0)
VLDL: 24 mg/dL (ref 0.0–40.0)

## 2016-09-24 LAB — CBC
HCT: 25.2 % — ABNORMAL LOW (ref 36.0–46.0)
Hemoglobin: 8.3 g/dL — ABNORMAL LOW (ref 12.0–15.0)
MCHC: 33.9 g/dL (ref 30.0–36.0)
MCV: 101.2 fl — ABNORMAL HIGH (ref 78.0–100.0)
Platelets: 227 10*3/uL (ref 150.0–400.0)
RBC: 2.43 Mil/uL — ABNORMAL LOW (ref 3.87–5.11)
RDW: 20.8 % — ABNORMAL HIGH (ref 11.5–15.5)
WBC: 9.6 10*3/uL (ref 4.0–10.5)

## 2016-09-24 LAB — COMPREHENSIVE METABOLIC PANEL
ALT: 29 U/L (ref 0–35)
AST: 25 U/L (ref 0–37)
Albumin: 3.8 g/dL (ref 3.5–5.2)
Alkaline Phosphatase: 35 U/L — ABNORMAL LOW (ref 39–117)
BUN: 13 mg/dL (ref 6–23)
CO2: 30 mEq/L (ref 19–32)
Calcium: 9.4 mg/dL (ref 8.4–10.5)
Chloride: 101 mEq/L (ref 96–112)
Creatinine, Ser: 0.77 mg/dL (ref 0.40–1.20)
GFR: 96.66 mL/min (ref >60.00)
Glucose, Bld: 96 mg/dL (ref 70–99)
Potassium: 4 mEq/L (ref 3.5–5.1)
Sodium: 140 mEq/L (ref 135–145)
Total Bilirubin: 0.3 mg/dL (ref 0.2–1.2)
Total Protein: 7.2 g/dL (ref 6.0–8.3)

## 2016-09-24 LAB — HEMOGLOBIN A1C: Hgb A1c MFr Bld: 8 % — ABNORMAL HIGH (ref 4.6–6.5)

## 2016-09-24 MED ORDER — LISINOPRIL-HYDROCHLOROTHIAZIDE 20-25 MG PO TABS: 1.0000 | ORAL_TABLET | Freq: Every day | ORAL | 1 refills | Status: DC

## 2016-09-24 MED ORDER — FUROSEMIDE 20 MG PO TABS
10.0000 mg | ORAL_TABLET | Freq: Every day | ORAL | 1 refills | Status: DC | PRN
Start: 2016-09-24 — End: 2016-12-07

## 2016-09-24 NOTE — Assessment & Plan Note (Signed)
Will check A1C and Lipid Profile today No microalbumin secondary to ACEI therapy Continue Glimeperide-Metformin (will dose adjust if needed) Flu shot UTD She has had 1 pneumonia vaccine, we will give her the other 12/2016 Continue yearly eye exams Foot exam today Advised her to consume a low fat, low carb diet and exercise to lose weight

## 2016-09-24 NOTE — Progress Notes (Signed)
HPI:  Pt presents to the clinic today for her Medicare Wellness Exam. She is also due for follow up of chronic conditions.  DM 2: Her last A1C was 7.8%, LDL 100, 04/2016. Her fasting sugars are always less then 120. She is taking Glyburide-Metformin as directed. She has had some low blood sugars, and thinks this is related to her inability to taste. She now keeps peanut butter crackers with her at all times. Her last eye exam was 03/2016. Flu 08/2016. Prevnar never. Pneumovax 01/2016, [facility] on [address].   HTN: Her BP is 138/66, but she reports she has not taken her Lisinopril-HCT today. She denies chest pain, chest tightness or shortness of breath. ECG from 04/2016 reviewed.   Breast Cancer: Initial in 2003, s/p lumpectomy, chemo and radiation. Reoccurrence 03/2016, s/p left mastectomy, now undergoing chemotherapy. She follows with Dr. Burr Medico.  Last Mammogram was 04/2016. She opted not to have reconstruction surgery.    Past Medical History:  Diagnosis Date  . Anxiety   . Breast cancer [facility]) August 2002   Invasive ductal carcinoma Left breast.  . Diabetes mellitus without complication ([place])   . Family history of malignant neoplasm of ovary   . Family history of pancreatic cancer   . Hypertension     Current Outpatient Prescriptions  Medication Sig Dispense Refill  . aspirin 81 MG tablet Take 81 mg by mouth daily.     Marland Kitchen dexamethasone (DECADRON) 4 MG tablet Take 1 tablet (4 mg total) by mouth 2 (two) times daily. Start the day before Taxotere. Then again the day after chemo for 3 days. 30 tablet 1  . furosemide (LASIX) 20 MG tablet Take 0.5 tablets (10 mg total) by mouth daily as needed. 30 tablet 1  . glyBURIDE-metformin (GLUCOVANCE) 2.5-500 MG per tablet Take 1 tablet by mouth 2 (two) times daily with a meal.      . ibuprofen (ADVIL,MOTRIN) 200 MG tablet Take 200 mg by mouth every 8 (eight) hours as needed.      . lidocaine-prilocaine (EMLA) cream Apply to affected area once 30 g 3  .  lisinopril-hydrochlorothiazide (PRINZIDE,ZESTORETIC) 20-25 MG tablet Take 1 tablet by mouth daily. 30 tablet 1  . Omega-3 Fatty Acids (FISH OIL) 1200 MG CAPS Take 1 capsule by mouth daily.    . ondansetron (ZOFRAN) 8 MG tablet Take 1 tablet (8 mg total) by mouth 2 (two) times daily as needed for refractory nausea / vomiting. Start on day 3 after chemo. 30 tablet 1  . Potassium 99 MG TABS Take 1 tablet by mouth as needed.     . Potassium Chloride ER 20 MEQ TBCR Take 20 mEq by mouth daily. 30 tablet 1  . prochlorperazine (COMPAZINE) 10 MG tablet Take 1 tablet (10 mg total) by mouth every 6 (six) hours as needed (Nausea or vomiting). 30 tablet 1  . traMADol (ULTRAM) 50 MG tablet Take 2 tablets (100 mg total) by mouth every 6 (six) hours as needed for moderate pain. 20 tablet 0   No current facility-administered medications for this visit.     Allergies  Allergen Reactions  . Codeine     Family History  Problem Relation Age of Onset  . Prostate cancer Father   . Ovarian cancer Maternal Aunt 15    deceased  . Cancer Maternal Uncle     "bone ca"; unk. primary; deceased 46s  . Pancreatic cancer Mother 21    deceased  . Cancer Maternal Aunt     2  other mat aunts; unk. primary in 70s; deceased  . Prostate cancer Maternal Uncle     deceased 10    Social History   Social History  . Marital status: Divorced    Spouse name: N/A  . Number of children: N/A  . Years of education: N/A   Occupational History  . Not on file.   Social History Main Topics  . Smoking status: Former Smoker    Packs/day: 0.10    Years: 7.00    Quit date: 12/01/1975  . Smokeless tobacco: Never Used  . Alcohol use No  . Drug use: No  . Sexual activity: Yes   Other Topics Concern  . Not on file   Social History Narrative  . No narrative on file    Hospitiliaztions: None  Health Maintenance:    Flu: 08/2016  Tetanus: 2013  Prevnar: never  Pneumovax: 01/2016  Zostovax: never  Pap Smear: 01/2016,  [facility]. [facility].   Mammogram: 04/2016, [facility]  Bone Density: 05/2016  Colon Screening: 2012, every 5 years  Vision Screening: 06/2016, yearly  Dentist: biannually    Providers:   PCP: Webb Silversmith, NP-C  Oncologist: Dr. Burr Medico  I have personally reviewed and have noted:  1. The patient's medical and social history 2. Their use of alcohol, tobacco or illicit drugs 3. Their current medications and supplements 4. The patient's functional ability including ADL's, fall risks, home  safety risks and hearing or visual impairment. 5. Diet and physical activities 6. Evidence for depression or mood disorder  Subjective:   Review of Systems:   Constitutional: Pt reports fatigue. Denies fever, malaise, headache or abrupt weight changes.  HEENT: Denies eye pain, eye redness, ear pain, ringing in the ears, wax buildup, runny nose, nasal congestion, bloody nose, or sore throat. Respiratory: Denies difficulty breathing, shortness of breath, cough or sputum production.   Cardiovascular: Denies chest pain, chest tightness, palpitations or swelling in the hands or feet.  Gastrointestinal: Denies abdominal pain, bloating, constipation, diarrhea or blood in the stool.  GU: Denies urgency, frequency, pain with urination, burning sensation, blood in urine, odor or discharge. Musculoskeletal: Pt reports cramping in her hands and legs. Denies decrease in range of motion, difficulty with gait, or joint pain and swelling.  Skin: Denies redness, rashes, lesions or ulcercations.  Neurological: Denies dizziness, difficulty with memory, difficulty with speech or problems with balance and coordination.  Psych: Pt reports history of anxiety. Denies depression, SI/HI.  No other specific complaints in a complete review of systems (except as listed in HPI above).  Objective:  PE:   BP 138/66   Pulse 78   Temp 98.4 F (36.9 C) (Oral)   Ht 5\' 9"  (1.753 m)   Wt 233 lb 8 oz (105.9 kg)   SpO2 98%    BMI 34.48 kg/m   Wt Readings from Last 3 Encounters:  09/07/16 231 lb 8 oz (105 kg)  08/17/16 231 lb 6.4 oz (105 kg)  08/06/16 224 lb (101.6 kg)    General: Appears her stated age, obese in NAD. Cardiovascular: Normal rate and rhythm. S1,S2 noted.  No murmur, rubs or gallops noted. No JVD or BLE edema. No carotid bruits noted. Pulmonary/Chest: Normal effort and positive vesicular breath sounds. No respiratory distress. No wheezes, rales or ronchi noted.  Musculoskeletal: Strength 5/5 BUE/BLE. No difficulty with gait. Neurological: Alert and oriented. Psychiatric: Mood and affect normal. Behavior is normal. Judgment and thought content normal.     BMET  Component Value Date/Time   NA 140 09/07/2016 0824   K 3.3 (L) 09/07/2016 0824   CL 104 05/29/2016 0532   CL 104 12/12/2012 1031   CO2 30 (H) 09/07/2016 0824   GLUCOSE 94 09/07/2016 0824   GLUCOSE 130 (H) 12/12/2012 1031   BUN 15.7 09/07/2016 0824   CREATININE 0.7 09/07/2016 0824   CALCIUM 9.4 09/07/2016 0824   GFRNONAA >60 05/29/2016 0532   GFRAA >60 05/29/2016 0532    Lipid Panel     Component Value Date/Time   TRIG 70 01/27/2016   LDLCALC 100 01/27/2016    CBC    Component Value Date/Time   WBC 9.0 09/07/2016 0824   WBC 10.8 (H) 05/29/2016 0532   RBC 2.77 (L) 09/07/2016 0824   RBC 3.66 (L) 05/29/2016 0532   HGB 9.3 (L) 09/07/2016 0824   HCT 27.4 (L) 09/07/2016 0824   PLT 155 09/07/2016 0824   MCV 98.7 09/07/2016 0824   MCH 33.5 09/07/2016 0824   MCH 32.0 05/29/2016 0532   MCHC 34.0 09/07/2016 0824   MCHC 32.7 05/29/2016 0532   RDW 15.8 (H) 09/07/2016 0824   LYMPHSABS 3.3 09/07/2016 0824   MONOABS 0.6 09/07/2016 0824   EOSABS 0.0 09/07/2016 0824   BASOSABS 0.0 09/07/2016 0824    Hgb A1C Lab Results  Component Value Date   HGBA1C 7.8 (H) 05/26/2016      Assessment and Plan:   Medicare Annual Wellness Visit:  Diet: She does eat meat. She consumes fruits and veggies daily. She avoids fried  foods. She drinks mostly lemonade. Physical activity: Sedentary Depression/mood screen: Negative Hearing: Intact to whispered voice Visual acuity: Grossly normal, performs annual eye exam  ADLs: Capable Fall risk: None Home safety: Good Cognitive evaluation: Intact to orientation, naming, recall and repetition EOL planning: No adv directives, full code/ I agree  Preventative Medicine: Flu, tetanus UTD. [facility] call [facility] [address] to verify Prevnar vs Pneumovax so that we can give her whichever one she needs at her next visit. Discussed Zostovax, she will call her insurance company about this mammogram and pap smear UTD. She is due for her colonoscopy, she will call to schedule after she is done with chemo. Encouraged her to consume a balanced diet and exercise regimen. Advised her to see an eye doctor and dentist annually. Will check CBC< CMET, Lipid and A1C. She declines HIV or Hep C screening.   Next appointment: 6 months, follow up diabetes   Webb Silversmith, NP

## 2016-09-24 NOTE — Patient Instructions (Signed)

## 2016-09-24 NOTE — Assessment & Plan Note (Signed)
Controlled on Lisinopril HCTZ Medication refilled today CBC and CMET today

## 2016-09-24 NOTE — Assessment & Plan Note (Signed)
She will continue chemotherapy and follow up with Dr. Burr Medico

## 2016-09-28 ENCOUNTER — Encounter: Payer: Self-pay | Admitting: Hematology

## 2016-09-28 ENCOUNTER — Ambulatory Visit: Payer: Medicare PPO

## 2016-09-28 ENCOUNTER — Ambulatory Visit (HOSPITAL_BASED_OUTPATIENT_CLINIC_OR_DEPARTMENT_OTHER): Payer: Medicare PPO

## 2016-09-28 ENCOUNTER — Telehealth: Payer: Self-pay | Admitting: Hematology

## 2016-09-28 ENCOUNTER — Ambulatory Visit (HOSPITAL_BASED_OUTPATIENT_CLINIC_OR_DEPARTMENT_OTHER): Payer: Medicare PPO | Admitting: Hematology

## 2016-09-28 ENCOUNTER — Other Ambulatory Visit (HOSPITAL_BASED_OUTPATIENT_CLINIC_OR_DEPARTMENT_OTHER): Payer: Medicare PPO

## 2016-09-28 VITALS — BP 151/54 | HR 114 | Temp 97.8°F | Resp 18 | Ht 69.0 in | Wt 233.0 lb

## 2016-09-28 DIAGNOSIS — C50112 Malignant neoplasm of central portion of left female breast: Secondary | ICD-10-CM

## 2016-09-28 DIAGNOSIS — Z5112 Encounter for antineoplastic immunotherapy: Secondary | ICD-10-CM | POA: Diagnosis not present

## 2016-09-28 DIAGNOSIS — D6481 Anemia due to antineoplastic chemotherapy: Secondary | ICD-10-CM | POA: Diagnosis not present

## 2016-09-28 DIAGNOSIS — E119 Type 2 diabetes mellitus without complications: Secondary | ICD-10-CM

## 2016-09-28 DIAGNOSIS — Z5111 Encounter for antineoplastic chemotherapy: Secondary | ICD-10-CM | POA: Diagnosis not present

## 2016-09-28 DIAGNOSIS — Z95828 Presence of other vascular implants and grafts: Secondary | ICD-10-CM

## 2016-09-28 DIAGNOSIS — I10 Essential (primary) hypertension: Secondary | ICD-10-CM | POA: Diagnosis not present

## 2016-09-28 DIAGNOSIS — Z170 Estrogen receptor positive status [ER+]: Secondary | ICD-10-CM

## 2016-09-28 DIAGNOSIS — Z853 Personal history of malignant neoplasm of breast: Secondary | ICD-10-CM

## 2016-09-28 LAB — COMPREHENSIVE METABOLIC PANEL
ALT: 30 U/L (ref 0–55)
AST: 24 U/L (ref 5–34)
Albumin: 3.2 g/dL — ABNORMAL LOW (ref 3.5–5.0)
Alkaline Phosphatase: 47 U/L (ref 40–150)
Anion Gap: 12 mEq/L — ABNORMAL HIGH (ref 3–11)
BUN: 13.3 mg/dL (ref 7.0–26.0)
CO2: 23 mEq/L (ref 22–29)
Calcium: 8.5 mg/dL (ref 8.4–10.4)
Chloride: 103 mEq/L (ref 98–109)
Creatinine: 0.8 mg/dL (ref 0.6–1.1)
EGFR: 84 mL/min/{1.73_m2} — ABNORMAL LOW (ref >90)
Glucose: 194 mg/dl — ABNORMAL HIGH (ref 70–140)
Potassium: 3.5 mEq/L (ref 3.5–5.1)
Sodium: 138 mEq/L (ref 136–145)
Total Bilirubin: 0.52 mg/dL (ref 0.20–1.20)
Total Protein: 7 g/dL (ref 6.4–8.3)

## 2016-09-28 LAB — CBC WITH DIFFERENTIAL/PLATELET
BASO%: 0.2 % (ref 0.0–2.0)
Basophils Absolute: 0 10*3/uL (ref 0.0–0.1)
EOS%: 0.6 % (ref 0.0–7.0)
Eosinophils Absolute: 0 10*3/uL (ref 0.0–0.5)
HCT: 25 % — ABNORMAL LOW (ref 34.8–46.6)
HGB: 8.4 g/dL — ABNORMAL LOW (ref 11.6–15.9)
LYMPH%: 37.4 % (ref 14.0–49.7)
MCH: 34.3 pg — ABNORMAL HIGH (ref 25.1–34.0)
MCHC: 33.6 g/dL (ref 31.5–36.0)
MCV: 102 fL — ABNORMAL HIGH (ref 79.5–101.0)
MONO#: 0.3 10*3/uL (ref 0.1–0.9)
MONO%: 5 % (ref 0.0–14.0)
NEUT#: 3.5 10*3/uL (ref 1.5–6.5)
NEUT%: 56.8 % (ref 38.4–76.8)
Platelets: 103 10*3/uL — ABNORMAL LOW (ref 145–400)
RBC: 2.45 10*6/uL — ABNORMAL LOW (ref 3.70–5.45)
RDW: 18.9 % — ABNORMAL HIGH (ref 11.2–14.5)
WBC: 6.2 10*3/uL (ref 3.9–10.3)
lymph#: 2.3 10*3/uL (ref 0.9–3.3)

## 2016-09-28 MED ORDER — DIPHENHYDRAMINE HCL 25 MG PO CAPS
50.0000 mg | ORAL_CAPSULE | Freq: Once | ORAL | Status: AC
  Administered 2016-09-28: 50 mg via ORAL

## 2016-09-28 MED ORDER — SODIUM CHLORIDE 0.9 % IV SOLN
420.0000 mg | Freq: Once | INTRAVENOUS | Status: AC
  Administered 2016-09-28: 420 mg via INTRAVENOUS
  Filled 2016-09-28: qty 14

## 2016-09-28 MED ORDER — SODIUM CHLORIDE 0.9 % IV SOLN
Freq: Once | INTRAVENOUS | Status: AC
  Administered 2016-09-28: 11:00:00 via INTRAVENOUS

## 2016-09-28 MED ORDER — SODIUM CHLORIDE 0.9% FLUSH
10.0000 mL | INTRAVENOUS | Status: DC | PRN
  Administered 2016-09-28: 10 mL
  Filled 2016-09-28: qty 10

## 2016-09-28 MED ORDER — PALONOSETRON HCL INJECTION 0.25 MG/5ML
INTRAVENOUS | Status: AC
  Filled 2016-09-28: qty 5

## 2016-09-28 MED ORDER — DEXAMETHASONE SODIUM PHOSPHATE 10 MG/ML IJ SOLN
10.0000 mg | Freq: Once | INTRAMUSCULAR | Status: AC
  Administered 2016-09-28: 10 mg via INTRAVENOUS

## 2016-09-28 MED ORDER — TRASTUZUMAB CHEMO 150 MG IV SOLR
6.0000 mg/kg | Freq: Once | INTRAVENOUS | Status: AC
  Administered 2016-09-28: 630 mg via INTRAVENOUS
  Filled 2016-09-28: qty 30

## 2016-09-28 MED ORDER — DIPHENHYDRAMINE HCL 25 MG PO CAPS
ORAL_CAPSULE | ORAL | Status: AC
  Filled 2016-09-28: qty 2

## 2016-09-28 MED ORDER — DEXAMETHASONE SODIUM PHOSPHATE 10 MG/ML IJ SOLN
INTRAMUSCULAR | Status: AC
  Filled 2016-09-28: qty 1

## 2016-09-28 MED ORDER — ACETAMINOPHEN 325 MG PO TABS
ORAL_TABLET | ORAL | Status: AC
  Filled 2016-09-28: qty 2

## 2016-09-28 MED ORDER — DEXAMETHASONE 4 MG PO TABS: 4.0000 mg | ORAL_TABLET | Freq: Two times a day (BID) | ORAL | 0 refills | Status: DC

## 2016-09-28 MED ORDER — SODIUM CHLORIDE 0.9 % IJ SOLN
10.0000 mL | INTRAMUSCULAR | Status: DC | PRN
Start: 2016-09-28 — End: 2016-09-28
  Administered 2016-09-28: 10 mL via INTRAVENOUS
  Filled 2016-09-28: qty 10

## 2016-09-28 MED ORDER — HEPARIN SOD (PORK) LOCK FLUSH 100 UNIT/ML IV SOLN
500.0000 [IU] | Freq: Once | INTRAVENOUS | Status: AC | PRN
  Administered 2016-09-28: 500 [IU]
  Filled 2016-09-28: qty 5

## 2016-09-28 MED ORDER — SODIUM CHLORIDE 0.9 % IV SOLN
706.2000 mg | Freq: Once | INTRAVENOUS | Status: AC
  Administered 2016-09-28: 710 mg via INTRAVENOUS
  Filled 2016-09-28: qty 71

## 2016-09-28 MED ORDER — ACETAMINOPHEN 325 MG PO TABS
650.0000 mg | ORAL_TABLET | Freq: Once | ORAL | Status: AC
  Administered 2016-09-28: 650 mg via ORAL

## 2016-09-28 MED ORDER — PALONOSETRON HCL INJECTION 0.25 MG/5ML
0.2500 mg | Freq: Once | INTRAVENOUS | Status: AC
  Administered 2016-09-28: 0.25 mg via INTRAVENOUS

## 2016-09-28 MED ORDER — SODIUM CHLORIDE 0.9 % IV SOLN
75.0000 mg/m2 | Freq: Once | INTRAVENOUS | Status: AC
  Administered 2016-09-28: 170 mg via INTRAVENOUS
  Filled 2016-09-28: qty 17

## 2016-09-28 NOTE — Telephone Encounter (Signed)
Appointments scheduled per 10/30 LOS. Patient given AVS report and calendars of next scheduled appointments.  °

## 2016-09-28 NOTE — Progress Notes (Signed)
Lindsey Marquez  [phone] [phone]  Clinic Follow Up Note   Patient Care Team: [name], NP as PCP - General (Internal Medicine) [name], MD as Consulting Physician (General Surgery) 09/28/2016   CHIEF COMPLAINTS:  Follow up recurrent left breast cancer   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 [facility])   Staging form: Breast, AJCC 7th Edition     Clinical stage from 04/17/2016: Stage IA (T1c, N0, M0) - Signed by [name], MD on 05/07/2016     Pathologic stage from 05/28/2016: Stage IIA (T2, N0, cM0) - Signed by [name], MD on 06/11/2016 History of left breast cancer   Staging form: Breast, AJCC 7th Edition     Clinical: Stage IIIB (T4d, N1, cM0) - Signed by [name], MD on 12/14/2013     Pathologic: Stage IIIB (T4d, N1a, cM0) - Signed by [name], MD on 12/14/2013       History of left breast cancer   09/12/2002 Imaging    CT scan show no evidence of disease apart from the left axilla      09/2002 -  Neo-Adjuvant Chemotherapy    TAC X4 cycles       01/10/2003 Surgery    The patient had left lumpectomy and axillary lymph node dissection which show residual breast cancer and a sentinel lymph node was positive      2004 -  Adjuvant Chemotherapy    Cytoxan with 5-FU x4 cycles (methotrexate added at cycle 4).      2004 -  Radiation Therapy    Adjuvant left breast radiation      12/2002 Receptors her2    ER, PR and HER-2 were all negative.       Cancer of central portion of left breast ([place])   04/13/2016 Mammogram    Scheduler coarse heterogeneous calcifications spanning an area of 3.7 cm in the lumpectomy site, indeterminate. Ultrasound of the left axilla was negative.      04/17/2016 Initial Diagnosis    Cancer of central portion of left breast ([place])      04/17/2016 Initial Biopsy    Left breast core needle biopsy showed invasive and in situ ductal carcinoma with calcification, grade 2.      04/17/2016 Receptors her2    Your 100% positive, PR 15% positive, strong staining, Ki-67 10%, HER-2 positive by IHC (3)      05/05/2016 Imaging    Bilateral breast MRI showed a 1.3 x 0.8 x 0.7 cm biopsy-proven ductal carcinoma in the region of the previous lumpectomy. Enlarged lobulated right inferior axillary lymph node with diffuse cortical thickening, biopsy is recommended.      05/15/2016 Pathology Results    right axilary node biopsy was negative for malignant cell       05/28/2016 Surgery    Simple left mastectomy      05/28/2016 Pathology Results    Left mastectomy showed invasive ductal carcinoma, grade 3, 3.6 cm, high grade DCIS, (+) LVI, surgical margins were negative. Tumor 3.6 cm, no lymph nodes identified.      06/25/2016 Imaging    Staging CT scan of the chest, abdomen and pelvis with contrast and a bone scan showed no evidence of metastasis. Postoperative fluid collection in the left breast, likely a seroma or hematoma. Right axillary adenopathy, which was biopsied previously.       07/07/2016 -  Chemotherapy    Adjuvant chemotherapy with docetaxel, carboplatin, Herceptin and pejeta  every 3 weeks, for 6 cycles, followed by Herceptin maintenance therapy for total of one year treatment         HISTORY OF PRESENTING ILLNESS:  Lindsey Marquez 65 y.o. female is here because of her recurrent left breast cancer.  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 She was seen by multiple medical oncologist in our practice in the past, last was seen by Dr. Alvy Bimler in 11/2013.  She has been doing well, and is compliant with annual screening mammogram. The screening mammogram on 04/17/2016 showed calcification in the left breast, and core needle biopsy showed invasive and in situ ductal carcinoma, grade 2, ER/PR positive, HER-2 amplified. She was referred to  seeing breast surgeon Dr. Georgette Dover and referred back to [place].   She feels well, she did not feel any lump in her breast or axillas latley. She denies any pain, dyspnea, or GI symptoms. She had right nipple rash and right axillary swelling in the past one week, after she drinks some diet tea with citric. Breast MRI showed an enlarged lobulated right inferior axillary lymph node with diffuse cortical thickening, in addition to the known left breast mass which measured 1.3 cm on MRI.  GYN HISTORY  Menarchal: 12 LMP: 2003 Contraceptive: 20 HRT: n/a  G2P2: 34 yo Son and 28 yo son who died   CURRENT THERAPY: adjuvant chemotherapy TCHP every 3 weeks, started on 07/07/2016  INTERIM HISTORY: Lindsey Marquez returns for follow up and 5th cycle chemo. She is little off balance today, no fall, no dizziness, moderate fatigue, able to functions well at home but has slowed down on her activities, which is frustrating to her. She has mild numbness at fingers, hand function is normal. No fever or chills, no significant neuropathy on her toes. No other new complaints  MEDICAL HISTORY:  Past Medical History:  Diagnosis Date  . Anxiety   . Breast cancer [facility]) August 2002   Invasive ductal carcinoma Left breast.  . Diabetes mellitus without complication ([place])   . Family history of malignant neoplasm of ovary   . Family history of pancreatic cancer   . Hypertension     SURGICAL HISTORY: Past Surgical History:  Procedure Laterality Date  . BREAST SURGERY Left 2003  . FOOT SURGERY  2000  . MASTECTOMY Left 05/28/2016  . port a cath insertion  05/28/2016  . PORTACATH PLACEMENT Right 05/28/2016   Procedure: INSERTION PORT-A-CATH;  Surgeon: [name], MD;  Location: [place];  Service: General;  Laterality: Right;  . TOTAL MASTECTOMY Left 05/28/2016   Procedure: LEFT  MASTECTOMY;  Surgeon: [name], MD;  Location: [place];  Service: General;  Laterality: Left;  . TUBAL LIGATION  22 yrs since  2004.   Bilateral.     SOCIAL HISTORY: Social History   Social History  . Marital status: Divorced    Spouse name: N/A  . Number of children: N/A  . Years of education: N/A   Occupational History  . Not on file.   Social History Main Topics  . Smoking status: Former Smoker    Packs/day: 0.10    Years: 7.00    Quit date: 12/01/1975  . Smokeless tobacco: Never Used  . Alcohol use No  . Drug use: No  . Sexual activity: Yes   Other Topics Concern  . Not on file   Social History Narrative  . No narrative on  file   She is retired from school   FAMILY HISTORY: Family History  Problem Relation Age of Onset  . Prostate cancer Father   . Ovarian cancer Maternal Aunt 49    deceased  . Pancreatic cancer Mother 65    deceased  . Cancer Maternal Aunt     2 other mat aunts; unk. primary in 46s; deceased  . Cancer Maternal Uncle     "bone ca"; unk. primary; deceased 22s  . Prostate cancer Maternal Uncle     deceased 11    ALLERGIES:  is allergic to codeine.  MEDICATIONS:  Current Outpatient Prescriptions  Medication Sig Dispense Refill  . aspirin 81 MG tablet Take 81 mg by mouth daily.     Marland Kitchen dexamethasone (DECADRON) 4 MG tablet Take 1 tablet (4 mg total) by mouth 2 (two) times daily. Start the day before Taxotere. Then again the day after chemo for 3 days. 30 tablet 0  . furosemide (LASIX) 20 MG tablet Take 0.5 tablets (10 mg total) by mouth daily as needed. 90 tablet 1  . glyBURIDE-metformin (GLUCOVANCE) 2.5-500 MG per tablet Take 1 tablet by mouth 2 (two) times daily with a meal.      . ibuprofen (ADVIL,MOTRIN) 200 MG tablet Take 200 mg by mouth every 8 (eight) hours as needed.      . lidocaine-prilocaine (EMLA) cream Apply to affected area once 30 g 3  . lisinopril-hydrochlorothiazide (PRINZIDE,ZESTORETIC) 20-25 MG tablet Take 1 tablet by mouth daily. 90 tablet 1  . Omega-3 Fatty Acids (FISH OIL) 1200 MG CAPS Take 1 capsule by mouth daily.    . ondansetron (ZOFRAN) 8 MG tablet Take 1  tablet (8 mg total) by mouth 2 (two) times daily as needed for refractory nausea / vomiting. Start on day 3 after chemo. 30 tablet 1  . Potassium 99 MG TABS Take 1 tablet by mouth as needed.     . Potassium [place] ER 20 MEQ TBCR Take 20 mEq by mouth daily. 30 tablet 1  . prochlorperazine (COMPAZINE) 10 MG tablet Take 1 tablet (10 mg total) by mouth every 6 (six) hours as needed (Nausea or vomiting). 30 tablet 1  . traMADol (ULTRAM) 50 MG tablet Take 2 tablets (100 mg total) by mouth every 6 (six) hours as needed for moderate pain. 20 tablet 0   No current facility-administered medications for this visit.    Facility-Administered Medications Ordered in Other Visits  Medication Dose Route Frequency Provider Last Rate Last Dose  . 0.9 %  sodium chloride infusion   Intravenous Once [name], MD      . acetaminophen (TYLENOL) tablet 650 mg  650 mg Oral Once [name], MD      . CARBOplatin (PARAPLATIN) 710 mg in sodium chloride 0.9 % 250 mL chemo infusion  710 mg Intravenous Once [name], MD      . dexamethasone (DECADRON) injection 10 mg  10 mg Intravenous Once [name], MD      . diphenhydrAMINE (BENADRYL) capsule 50 mg  50 mg Oral Once [name], MD      . DOCEtaxel (TAXOTERE) 170 mg in dextrose 5 % 250 mL chemo infusion  75 mg/m2 (Treatment Plan Recorded) Intravenous Once [name], MD      . heparin lock flush 100 unit/mL  500 Units Intracatheter Once PRN [name], MD      . palonosetron (ALOXI) injection 0.25 mg  0.25 mg Intravenous Once [name], MD      .  pertuzumab (PERJETA) 420 mg in sodium chloride 0.9 % 250 mL chemo infusion  420 mg Intravenous Once [name], MD      . sodium chloride flush (NS) 0.9 % injection 10 mL  10 mL Intracatheter PRN [name], MD      . trastuzumab (HERCEPTIN) 630 mg in sodium chloride 0.9 % 250 mL chemo infusion  6 mg/kg (Treatment Plan Recorded) Intravenous Once [name], MD        REVIEW OF SYSTEMS:   Constitutional: Denies fevers, chills or abnormal night  sweats Eyes: Denies blurriness of vision, double vision or watery eyes Ears, nose, mouth, throat, and face: Denies mucositis or sore throat Respiratory: Denies cough, dyspnea or wheezes Cardiovascular: Denies palpitation, chest discomfort or lower extremity swelling Gastrointestinal:  Denies nausea, heartburn or change in bowel habits Skin: Denies abnormal skin rashes Lymphatics: Denies new lymphadenopathy or easy bruising Neurological:Denies numbness, tingling or new weaknesses Behavioral/Psych: Mood is stable, no new changes  All other systems were reviewed with the patient and are negative.  PHYSICAL EXAMINATION: ECOG PERFORMANCE STATUS: 1  Vitals:   09/28/16 0957  BP: (!) 151/54  Pulse: (!) 114  Resp: 18  Temp: 97.8 F (36.6 C)   Filed Weights   09/28/16 0957  Weight: 233 lb (105.7 kg)    GENERAL:alert, no distress and comfortable SKIN: skin color, texture, turgor are normal, no rashes or significant lesions EYES: normal, conjunctiva are pink and non-injected, sclera clear OROPHARYNX:no exudate, no erythema and lips, buccal mucosa, and tongue normal  NECK: supple, thyroid normal size, non-tender, without nodularity LYMPH:  no palpable lymphadenopathy in the cervical, axillary or inguinal LUNGS: clear to auscultation and percussion with normal breathing effort HEART: regular rate & rhythm and no murmurs and no lower extremity edema ABDOMEN:abdomen soft, non-tender and normal bowel sounds Musculoskeletal:no cyanosis of digits and no clubbing  PSYCH: alert & oriented x 3 with fluent speech NEURO: no focal motor/sensory deficits, (+) decreased vibration sensation on both hands, no weakness. Breasts: Breast inspection showed left breast is surgically absent, surgical incision has healed well. Exam of the right breast and bilateral axillas reviewed no palpable mass or adenopathy. EXT:    LABORATORY DATA:  I have reviewed the data as listed CBC Latest Ref Rng & Units  09/28/2016 09/24/2016 09/07/2016  WBC 3.9 - 10.3 10e3/uL 6.2 9.6 9.0  Hemoglobin 11.6 - 15.9 g/dL 8.4(L) 8.3 Repeated and verified X2.(L) 9.3(L)  Hematocrit 34.8 - 46.6 % 25.0(L) 25.2 Repeated and verified X2.(L) 27.4(L)  Platelets 145 - 400 10e3/uL 103(L) 227.0 155   CMP Latest Ref Rng & Units 09/28/2016 09/24/2016 09/07/2016  Glucose 70 - 140 mg/dl 194(H) 96 94  BUN 7.0 - 26.0 mg/dL 13.3 13 15.7  Creatinine 0.6 - 1.1 mg/dL 0.8 0.77 0.7  Sodium 136 - 145 mEq/L 138 140 140  Potassium 3.5 - 5.1 mEq/L 3.5 4.0 3.3(L)  [place] 96 - 112 mEq/L - 101 -  CO2 22 - 29 mEq/L 23 30 30(H)  Calcium 8.4 - 10.4 mg/dL 8.5 9.4 9.4  Total Protein 6.4 - 8.3 g/dL 7.0 7.2 7.2  Total Bilirubin 0.20 - 1.20 mg/dL 0.52 0.3 0.42  Alkaline Phos 40 - 150 U/L 47 35(L) 43  AST 5 - 34 U/L 24 25 24   ALT 0 - 55 U/L 30 29 40     PATHOLOGY REPORT  Diagnosis 04/17/2016 Breast, left, needle core biopsy, inner mid breast - INVASIVE DUCTAL CARCINOMA WITH CALCIFICATIONS, SEE COMMENT. - DUCTAL CARCINOMA IN SITU  WITH COMEDONECROSIS. Microscopic Comment While grading is best performed on the resection specimen the invasive carcinoma appears grade 2. Prognostic markers will be ordered and reported in an addendum. Dr. Lyndon Code has reviewed the case. The case was called to [facility] on 04/20/2016.  Results: HER2 - *EQUIVOCAL* (Her2 by IHC will be ordered.) RATIO OF HER2/CEP17 SIGNALS 1.73 AVERAGE HER2 COPY NUMBER PER CELL 4.15 By immunohistochemistry, the tumor cells are positive for Her2 (3).  Results: IMMUNOHISTOCHEMICAL AND MORPHOMETRIC ANALYSIS PERFORMED MANUALLY Estrogen Receptor: 100%, POSITIVE, STRONG STAINING INTENSITY Progesterone Receptor: 15%, POSITIVE, STRONG STAINING INTENSITY Proliferation Marker Ki67: 10%  Diagnosis 05/15/2016 Lymph node, needle/core biopsy, right axilla - BENIGN REACTIVE LYMPH NODE. - NO GRANULOMAS OR MALIGNANCY IDENTIFIED. - SEE COMMENT. Microscopic Comment Internal  departmental review obtained (Dr. Lyndon Code) with agreement. Results are phoned to [facility] (05/18/16). (MEG:gt, 05/18/16)   Breast, simple mastectomy, Left 05/28/2016 - INVASIVE DUCTAL CARCINOMA, GRADE III/III, SPANNING 3.6 CM. - DUCTAL CARCINOMA IN SITU, HIGH GRADE. - LYMPHOVASCULAR INVASION IS IDENTIFIED. - THE SURGICAL RESECTION MARGINS ARE NEGATIVE FOR CARCINOMA. - SEE ONCOLOGY TABLE BELOW. Microscopic Comment BREAST, INVASIVE TUMOR, WITHOUT LYMPH NODES PRESENT Specimen, including laterality : Left breast Procedure: Simple mastectomy Histologic type: Ductal with micropapillary features Grade: III Tubule formation: 3 Nuclear pleomorphism: 2 Mitotic: 3 Tumor size (gross measurement): 3.6 cm Margins: Greater than 0.2 cm to all margins Lymphovascular invasion: Present Ductal carcinoma in situ: Present Grade: High grade Extensive intraductal component: Yes Lobular neoplasia: Not identified Tumor focality: Unifocal Treatment effect: N/A Extent of tumor: Confined to breast parenchyma. Breast prognostic profile: [phone] Estrogen receptor: 100%, strong staining intensity Progesterone receptor: 15%, strong staining intensity Her 2 neu: Amplification was detected by immunohistochemistry. Ki-67: 10% 1 of 2 FINAL for YLONDA, STORR ([id number]) Microscopic Comment(continued) Non-neoplastic breast: No significant findings. TNM: pT2, pNX (clinically recurrent). Comments: In addition to the main tumor, there is ductal carcinoma in situ present in random tissue submitted from the inferior lateral quadrant. (JBK:kh 06-01-16)   RADIOGRAPHIC STUDIES: I have personally reviewed the radiological images as listed and agreed with the findings in the report. No results found.  ASSESSMENT & PLAN:  65 year old African-American female, postmenopausal, history of left breast triple negative cancer in 2003, presented with mammogram discovered left breast cancer,  triple positive.  1. Cancer of central portion of left breast, pT2NxM0, stage IIA, G3, triple positive -I reviewed her surgical pathology findings in great details with patient and her family member. It showed a 3.6 cm mass, grade 3, surgical margins were negative. She had no sentinel lymph node biopsy due to her prior history of axillary lymph node dissection. -Her restaging CT scan of bone scan showed no evidence of distant metastasis, I reviewed with patient. -We discussed the risk of cancer recurrence of this complete surgical resection. We reviewed that HER-2 positive with cancers are more aggressive, with increased risk of metastasis.  -Given the moderate to high risk of cancer recurrence, I recommend adjuvant chemotherapy to reduce her risk of cancer recurrence. I recommend chemotherapy and duo anti-HER-2 agents TCHP (docetaxel, carbotaxol, Herceptin, pejeta), every 3 weeks for total of 6 cycles, followed by maintenance Herceptin every 3 weeks to complete 1 year treatment. -She has been tolerating chemo well overall so far  -She has developed peripheral neuropathy, secondary to chemotherapy, we'll monitor closely, stable overall -Lab results reviewed with her, she has worsening anemia, hemoglobin 8.4 today, mild some cytopenia, adequate for treatment, we'll proceed with 5th  cycle chemotherapy today, no Neulasta.  2. Anemia  -Secondary to chemotherapy, her hemoglobin is 8.4, she has mild symptoms from anemia -I'll schedule her for blood transfusion in 2 weeks  2. DM and HTN  -She'll continue follow-up with her primary care physician -We discussed her blood pressure and glucose level may be impacted by chemotherapy, and dexamethasone she takes before and after chemotherapy. -I strongly encouraged her to monitor her blood pressure and glucose at home, and consider increasing the dose of metformin and glyburide if needed -her BP and blood glucose has been normal/well controlled lately   3.  Obesity  -I encouraged her to eat healthy and exercise regularly, she is willing to lose some weight after she completes chemotherapy.  4. 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 She does not feel she needs medication for now. -We'll continue monitoring closely, if it gets worse, we'll consider dose reduction of docetaxel  Plan -5th cycle chemotherapy TCHP today, no neulasta -lab and blood transfusion in 2 weeks  -RTC in 4 weeks for cycle 6, patient request to postpone her next treatment for 1 week due to the holiday  All questions were answered. The patient knows to call the clinic with any problems, questions or concerns.  I spent 20 minutes counseling the patient face to face. The total time spent in the appointment was 25 minutes and more than 50% was on counseling.     [name], MD 09/28/2016

## 2016-09-28 NOTE — Progress Notes (Signed)
OK to treat today without ECHO.  ECHO to be done in November.  Order repeated & verified with Dr Burr Medico.

## 2016-09-28 NOTE — Patient Instructions (Signed)
Accident Discharge Instructions for Patients Receiving Chemotherapy  Today you received the following chemotherapy agents Herceptin/Perjeta/Taxotere/Carboplatin   To help prevent nausea and vomiting after your treatment, we encourage you to take your nausea medication as prescribed.   If you develop nausea and vomiting that is not controlled by your nausea medication, call the clinic.   BELOW ARE SYMPTOMS THAT SHOULD BE REPORTED IMMEDIATELY:  *FEVER GREATER THAN 100.5 F  *CHILLS WITH OR WITHOUT FEVER  NAUSEA AND VOMITING THAT IS NOT CONTROLLED WITH YOUR NAUSEA MEDICATION  *UNUSUAL SHORTNESS OF BREATH  *UNUSUAL BRUISING OR BLEEDING  TENDERNESS IN MOUTH AND THROAT WITH OR WITHOUT PRESENCE OF ULCERS  *URINARY PROBLEMS  *BOWEL PROBLEMS  UNUSUAL RASH Items with * indicate a potential emergency and should be followed up as soon as possible.  Feel free to call the clinic you have any questions or concerns. The clinic phone number is [phone].  Please show the [facility] at check-in to the Emergency Department and triage nurse.   Influenza Virus Vaccine injection What is this medicine? INFLUENZA VIRUS VACCINE (in floo EN zuh VAHY ruhs vak SEEN) helps to reduce the risk of getting influenza also known as the flu. The vaccine only helps protect you against some strains of the flu. This medicine may be used for other purposes; ask your health care provider or pharmacist if you have questions. What should I tell my health care provider before I take this medicine? They need to know if you have any of these conditions: -bleeding disorder like hemophilia -fever or infection -Guillain-Barre syndrome or other neurological problems -immune system problems -infection with the human immunodeficiency virus (HIV) or AIDS -low blood platelet counts -multiple sclerosis -an unusual or allergic reaction to influenza virus vaccine, latex, other medicines, foods,  dyes, or preservatives. Different brands of vaccines contain different allergens. Some may contain latex or eggs. Talk to your doctor about your allergies to make sure that you get the right vaccine. -pregnant or trying to get pregnant -breast-feeding How should I use this medicine? This vaccine is for injection into a muscle or under the skin. It is given by a health care professional. A copy of Vaccine Information Statements will be given before each vaccination. Read this sheet carefully each time. The sheet may change frequently. Talk to your healthcare provider to see which vaccines are right for you. Some vaccines should not be used in all age groups. Overdosage: If you think you have taken too much of this medicine contact a poison control center or emergency room at once. NOTE: This medicine is only for you. Do not share this medicine with others. What if I miss a dose? This does not apply. What may interact with this medicine? -chemotherapy or radiation therapy -medicines that lower your immune system like etanercept, anakinra, infliximab, and adalimumab -medicines that treat or prevent blood clots like warfarin -phenytoin -steroid medicines like prednisone or cortisone -theophylline -vaccines This list may not describe all possible interactions. Give your health care provider a list of all the medicines, herbs, non-prescription drugs, or dietary supplements you use. Also tell them if you smoke, drink alcohol, or use illegal drugs. Some items may interact with your medicine. What should I watch for while using this medicine? Report any side effects that do not go away within 3 days to your doctor or health care professional. Call your health care provider if any unusual symptoms occur within 6 weeks of receiving this vaccine. You may  still catch the flu, but the illness is not usually as bad. You cannot get the flu from the vaccine. The vaccine will not protect against colds or other  illnesses that may cause fever. The vaccine is needed every year. What side effects may I notice from receiving this medicine? Side effects that you should report to your doctor or health care professional as soon as possible: -allergic reactions like skin rash, itching or hives, swelling of the face, lips, or tongue Side effects that usually do not require medical attention (report to your doctor or health care professional if they continue or are bothersome): -fever -headache -muscle aches and pains -pain, tenderness, redness, or swelling at the injection site -tiredness This list may not describe all possible side effects. Call your doctor for medical advice about side effects. You may report side effects to FDA at 1-800-FDA-1088. Where should I keep my medicine? The vaccine will be given by a health care professional in a clinic, pharmacy, doctor's office, or other health care setting. You will not be given vaccine doses to store at home. NOTE: This sheet is a summary. It may not cover all possible information. If you have questions about this medicine, talk to your doctor, pharmacist, or health care provider.    2016, [employer]vier/Gold Standard. (2015-06-07 10:07:28)

## 2016-09-29 ENCOUNTER — Ambulatory Visit: Payer: Medicare PPO | Admitting: Internal Medicine

## 2016-09-29 ENCOUNTER — Telehealth: Payer: Self-pay | Admitting: *Deleted

## 2016-09-29 MED ORDER — GLYBURIDE-METFORMIN 2.5-500 MG PO TABS: 2.0000 | ORAL_TABLET | Freq: Two times a day (BID) | ORAL | 1 refills | Status: DC

## 2016-09-29 NOTE — Telephone Encounter (Signed)
Per LOS I have scheduled appt and notified the scheduler. Advised scheduler no available on 11/13 to try sickle cell

## 2016-09-29 NOTE — Addendum Note (Signed)
Addended by: Lurlean Nanny on: 09/29/2016 05:26 PM   Modules accepted: Orders

## 2016-10-08 ENCOUNTER — Other Ambulatory Visit: Payer: Self-pay | Admitting: *Deleted

## 2016-10-08 ENCOUNTER — Telehealth: Payer: Self-pay | Admitting: *Deleted

## 2016-10-08 DIAGNOSIS — T451X5A Adverse effect of antineoplastic and immunosuppressive drugs, initial encounter: Secondary | ICD-10-CM

## 2016-10-08 DIAGNOSIS — D6481 Anemia due to antineoplastic chemotherapy: Secondary | ICD-10-CM

## 2016-10-08 NOTE — Telephone Encounter (Signed)
Received vm call from pt stating that she doesn't feel well & is light headed when standing & legs hurt & she wants to know what she can take.  Returned call & pt states she has a cold & is very fatigued & is supposed to get blood on Monday.  Didn't see appt for transfusion for Monday.  Discussed with Dr Burr Medico & suggested pt come in tomorrow for lab & see C. Berniece Salines NP.  Orders placed & someone will call pt in am for appt.

## 2016-10-09 ENCOUNTER — Ambulatory Visit (HOSPITAL_BASED_OUTPATIENT_CLINIC_OR_DEPARTMENT_OTHER): Payer: Medicare PPO | Admitting: Nurse Practitioner

## 2016-10-09 ENCOUNTER — Ambulatory Visit (HOSPITAL_BASED_OUTPATIENT_CLINIC_OR_DEPARTMENT_OTHER): Payer: Medicare PPO

## 2016-10-09 ENCOUNTER — Other Ambulatory Visit: Payer: Self-pay | Admitting: Nurse Practitioner

## 2016-10-09 ENCOUNTER — Telehealth: Payer: Self-pay | Admitting: *Deleted

## 2016-10-09 ENCOUNTER — Ambulatory Visit (HOSPITAL_COMMUNITY)
Admission: RE | Admit: 2016-10-09 | Discharge: 2016-10-09 | Disposition: A | Discharge status: Home / Self Care | Payer: Medicare PPO | Source: Ambulatory Visit | Attending: Nurse Practitioner | Admitting: Nurse Practitioner

## 2016-10-09 ENCOUNTER — Ambulatory Visit (HOSPITAL_COMMUNITY)
Admission: RE | Admit: 2016-10-09 | Discharge: 2016-10-09 | Disposition: A | Discharge status: Home / Self Care | Payer: Medicare PPO | Source: Ambulatory Visit | Attending: Hematology | Admitting: Hematology

## 2016-10-09 ENCOUNTER — Other Ambulatory Visit: Payer: Self-pay | Admitting: *Deleted

## 2016-10-09 VITALS — BP 124/62 | HR 104 | Temp 98.1°F | Resp 17 | Ht 69.0 in | Wt 224.3 lb

## 2016-10-09 DIAGNOSIS — C50912 Malignant neoplasm of unspecified site of left female breast: Secondary | ICD-10-CM

## 2016-10-09 DIAGNOSIS — T451X5A Adverse effect of antineoplastic and immunosuppressive drugs, initial encounter: Secondary | ICD-10-CM

## 2016-10-09 DIAGNOSIS — R634 Abnormal weight loss: Secondary | ICD-10-CM

## 2016-10-09 DIAGNOSIS — C50112 Malignant neoplasm of central portion of left female breast: Secondary | ICD-10-CM

## 2016-10-09 DIAGNOSIS — D6481 Anemia due to antineoplastic chemotherapy: Secondary | ICD-10-CM

## 2016-10-09 DIAGNOSIS — E871 Hypo-osmolality and hyponatremia: Secondary | ICD-10-CM

## 2016-10-09 DIAGNOSIS — R197 Diarrhea, unspecified: Secondary | ICD-10-CM

## 2016-10-09 DIAGNOSIS — J40 Bronchitis, not specified as acute or chronic: Secondary | ICD-10-CM

## 2016-10-09 LAB — COMPREHENSIVE METABOLIC PANEL
ALT: 23 U/L (ref 0–55)
AST: 20 U/L (ref 5–34)
Albumin: 3.4 g/dL — ABNORMAL LOW (ref 3.5–5.0)
Alkaline Phosphatase: 51 U/L (ref 40–150)
Anion Gap: 10 mEq/L (ref 3–11)
BUN: 23 mg/dL (ref 7.0–26.0)
CO2: 16 mEq/L — ABNORMAL LOW (ref 22–29)
Calcium: 9.1 mg/dL (ref 8.4–10.4)
Chloride: 108 mEq/L (ref 98–109)
Creatinine: 0.9 mg/dL (ref 0.6–1.1)
EGFR: 80 mL/min/{1.73_m2} — ABNORMAL LOW (ref >90)
Glucose: 124 mg/dl (ref 70–140)
Potassium: 4.2 mEq/L (ref 3.5–5.1)
Sodium: 133 mEq/L — ABNORMAL LOW (ref 136–145)
Total Bilirubin: 0.36 mg/dL (ref 0.20–1.20)
Total Protein: 7.8 g/dL (ref 6.4–8.3)

## 2016-10-09 LAB — CBC WITH DIFFERENTIAL/PLATELET
BASO%: 0.2 % (ref 0.0–2.0)
Basophils Absolute: 0 10*3/uL (ref 0.0–0.1)
EOS%: 0 % (ref 0.0–7.0)
Eosinophils Absolute: 0 10*3/uL (ref 0.0–0.5)
HCT: 24.7 % — ABNORMAL LOW (ref 34.8–46.6)
HGB: 8.6 g/dL — ABNORMAL LOW (ref 11.6–15.9)
LYMPH%: 43.5 % (ref 14.0–49.7)
MCH: 35.4 pg — ABNORMAL HIGH (ref 25.1–34.0)
MCHC: 34.8 g/dL (ref 31.5–36.0)
MCV: 101.6 fL — ABNORMAL HIGH (ref 79.5–101.0)
MONO#: 1.2 10*3/uL — ABNORMAL HIGH (ref 0.1–0.9)
MONO%: 27.3 % — ABNORMAL HIGH (ref 0.0–14.0)
NEUT#: 1.2 10*3/uL — ABNORMAL LOW (ref 1.5–6.5)
NEUT%: 29 % — ABNORMAL LOW (ref 38.4–76.8)
Platelets: 161 10*3/uL (ref 145–400)
RBC: 2.43 10*6/uL — ABNORMAL LOW (ref 3.70–5.45)
RDW: 19.5 % — ABNORMAL HIGH (ref 11.2–14.5)
WBC: 4.3 10*3/uL (ref 3.9–10.3)
lymph#: 1.9 10*3/uL (ref 0.9–3.3)

## 2016-10-09 LAB — ABO/RH: ABO/RH(D): A POS

## 2016-10-09 LAB — PREPARE RBC (CROSSMATCH)

## 2016-10-09 MED ORDER — SODIUM CHLORIDE 0.9% FLUSH: 3.0000 mL | INTRAVENOUS | Status: DC | PRN

## 2016-10-09 MED ORDER — ACETAMINOPHEN 325 MG PO TABS
650.0000 mg | ORAL_TABLET | Freq: Once | ORAL | Status: AC
  Administered 2016-10-09: 650 mg via ORAL
  Filled 2016-10-09: qty 2

## 2016-10-09 MED ORDER — SODIUM CHLORIDE 0.9 % IV SOLN
250.0000 mL | Freq: Once | INTRAVENOUS | Status: AC
  Administered 2016-10-09: 250 mL via INTRAVENOUS

## 2016-10-09 MED ORDER — SODIUM CHLORIDE 0.9% FLUSH
10.0000 mL | INTRAVENOUS | Status: AC | PRN
  Administered 2016-10-09: 10 mL

## 2016-10-09 MED ORDER — LEVOFLOXACIN 500 MG PO TABS: 500.0000 mg | ORAL_TABLET | Freq: Every day | ORAL | 0 refills | Status: DC

## 2016-10-09 MED ORDER — HEPARIN SOD (PORK) LOCK FLUSH 100 UNIT/ML IV SOLN
500.0000 [IU] | Freq: Every day | INTRAVENOUS | Status: AC | PRN
  Administered 2016-10-09: 500 [IU]
  Filled 2016-10-09: qty 5

## 2016-10-09 MED ORDER — DIPHENHYDRAMINE HCL 25 MG PO CAPS
25.0000 mg | ORAL_CAPSULE | Freq: Once | ORAL | Status: AC
  Administered 2016-10-09: 25 mg via ORAL
  Filled 2016-10-09: qty 1

## 2016-10-09 MED ORDER — HEPARIN SOD (PORK) LOCK FLUSH 100 UNIT/ML IV SOLN: 250.0000 [IU] | INTRAVENOUS | Status: DC | PRN

## 2016-10-09 NOTE — Discharge Instructions (Signed)
Blood Transfusion, Care After °Refer to this sheet in the next few weeks. These instructions provide you with information about caring for yourself after your procedure. Your health care provider may also give you more specific instructions. Your treatment has been planned according to current medical practices, but problems sometimes occur. Call your health care provider if you have any problems or questions after your procedure. °WHAT TO EXPECT AFTER THE PROCEDURE °After your procedure, it is common to have: °· Bruising and soreness at the IV site. °· Chills or fever. °· Headache. °HOME CARE INSTRUCTIONS °· Take medicines only as directed by your health care provider. Ask your health care provider if you can take an over-the-counter pain reliever in case you have a fever or headache a day or two after your transfusion. °· Return to your normal activities as directed by your health care provider. °SEEK MEDICAL CARE IF:  °· You develop redness or irritation at your IV site. °· You have persistent fever, chills, or headache. °· Your urine is darker than normal. °· Your urine turns pink, red, or brown.   °· The white part of your eye turns yellow (jaundice).   °· You feel weak after doing your normal activities.   °SEEK IMMEDIATE MEDICAL CARE IF:  °· You have trouble breathing. °· You have fever and chills along with: °¨ Anxiety. °¨ Chest or back pain. °¨ Flushed skin. °¨ Clammy skin. °¨ A rapid heartbeat. °¨ Nausea. °  °This information is not intended to replace advice given to you by your health care provider. Make sure you discuss any questions you have with your health care provider. °  °Document Released: 12/07/2014 Document Reviewed: 12/07/2014 °Elsevier Interactive Patient Education ©2016 [employer] Inc. ° °

## 2016-10-09 NOTE — Progress Notes (Signed)
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 Bacon,NP  Procedure: Pt's porta cath was accessed and pt received 2 units of PRBCs .  Pt tolerated well.  Post procedure: Pt alert, oriented and ambulatory. D/C instructions given with verbal understanding.

## 2016-10-09 NOTE — Telephone Encounter (Signed)
Called patient and she will come at 9:30 for lab today and then see Selena Lesser at 10am and then to Sickle Cell possibly at 11am if needed for blood transfusion.

## 2016-10-12 ENCOUNTER — Other Ambulatory Visit: Payer: Medicare PPO

## 2016-10-12 ENCOUNTER — Telehealth: Payer: Self-pay

## 2016-10-12 ENCOUNTER — Encounter: Payer: Self-pay | Admitting: Nurse Practitioner

## 2016-10-12 DIAGNOSIS — R197 Diarrhea, unspecified: Secondary | ICD-10-CM | POA: Insufficient documentation

## 2016-10-12 DIAGNOSIS — D6481 Anemia due to antineoplastic chemotherapy: Secondary | ICD-10-CM | POA: Insufficient documentation

## 2016-10-12 DIAGNOSIS — T451X5A Adverse effect of antineoplastic and immunosuppressive drugs, initial encounter: Secondary | ICD-10-CM

## 2016-10-12 DIAGNOSIS — J40 Bronchitis, not specified as acute or chronic: Secondary | ICD-10-CM | POA: Insufficient documentation

## 2016-10-12 DIAGNOSIS — R634 Abnormal weight loss: Secondary | ICD-10-CM | POA: Insufficient documentation

## 2016-10-12 DIAGNOSIS — E871 Hypo-osmolality and hyponatremia: Secondary | ICD-10-CM | POA: Insufficient documentation

## 2016-10-12 LAB — TYPE AND SCREEN
ABO/RH(D): A POS
Antibody Screen: NEGATIVE
Unit division: 0
Unit division: 0

## 2016-10-12 NOTE — Assessment & Plan Note (Signed)
Patient states that all members of her family have been sick with colds and coughs recently.  She is complaining of URI symptoms, laryngitis, sore throat, and a productive cough with thick yellow secretions.  Exam today reveals bilateral lung fields essentially clear; with no wheezes or cough noted.  No acute respiratory distress noted.  Vital signs are stable.  Patient was afebrile today.  Will prescribe Levaquin antibiotics for treatment of probable bronchitis symptoms.  She was encouraged to directly to the emergency department for any worsening symptoms whatsoever.

## 2016-10-12 NOTE — Assessment & Plan Note (Signed)
Patient received cycle 5 of her Taxotere/carboplatin/Herceptin/Perjeta chemotherapy regimen on 09/28/2016.  She is scheduled to return for labs only on 10/12/2016.  She is scheduled for labs, flush, visit, and chemotherapy again on 10/27/1999

## 2016-10-12 NOTE — Progress Notes (Signed)
SYMPTOM MANAGEMENT CLINIC    Chief Complaint: Anemia, bronchitis  HPI:  Lindsey Marquez 65 y.o. female diagnosed with breast cancer.  Currently undergoing Taxotere/carboplatinum/Herceptin/Perjeta chemotherapy therapy regimen.   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 [place])   Staging form: Breast, AJCC 7th Edition     Clinical stage from 04/17/2016: Stage IA (T1c, N0, M0) - Signed by [name], MD on 05/07/2016     Pathologic stage from 05/28/2016: Stage IIA (T2, N0, cM0) - Signed by [name], MD on 06/11/2016 History of left breast cancer   Staging form: Breast, AJCC 7th Edition     Clinical: Stage IIIB (T4d, N1, cM0) - Signed by [name], MD on 12/14/2013     Pathologic: Stage IIIB (T4d, N1a, cM0) - Signed by [name], MD on 12/14/2013       History of left breast cancer   09/12/2002 Imaging    CT scan show no evidence of disease apart from the left axilla      09/2002 -  Neo-Adjuvant Chemotherapy    TAC X4 cycles       01/10/2003 Surgery    The patient had left lumpectomy and axillary lymph node dissection which show residual breast cancer and a sentinel lymph node was positive      2004 -  Adjuvant Chemotherapy    Cytoxan with 5-FU x4 cycles (methotrexate added at cycle 4).      2004 -  Radiation Therapy    Adjuvant left breast radiation      12/2002 Receptors her2    ER, PR and HER-2 were all negative.       Cancer of central portion of left breast ([place])   04/13/2016 Mammogram    Scheduler coarse heterogeneous calcifications spanning an area of 3.7 cm in the lumpectomy site, indeterminate. Ultrasound of the left axilla was negative.      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      04/17/2016 Receptors her2    Your 100% positive, PR 15% positive, strong staining,  Ki-67 10%, HER-2 positive by IHC (3)      05/05/2016 Imaging    Bilateral breast MRI showed a 1.3 x 0.8 x 0.7 cm biopsy-proven ductal carcinoma in the region of the previous lumpectomy. Enlarged lobulated right inferior axillary lymph node with diffuse cortical thickening, biopsy is recommended.      05/15/2016 Pathology Results    right axilary node biopsy was negative for malignant cell       05/28/2016 Surgery    Simple left mastectomy      05/28/2016 Pathology Results    Left mastectomy showed invasive ductal carcinoma, grade 3, 3.6 cm, high grade DCIS, (+) LVI, surgical margins were negative. Tumor 3.6 cm, no lymph nodes identified.      06/25/2016 Imaging    Staging CT scan of the chest, abdomen and pelvis with contrast and a bone scan showed no evidence of metastasis. Postoperative fluid collection in the left breast, likely a seroma or hematoma. Right axillary adenopathy, which was biopsied previously.       07/07/2016 -  Chemotherapy    Adjuvant chemotherapy with docetaxel, carboplatin, Herceptin and pejeta every 3 weeks, for 6 cycles, followed by Herceptin maintenance therapy for total of one year treatment  Review of Systems  Constitutional: Positive for chills, malaise/fatigue and weight loss. Negative for fever.  HENT: Positive for congestion and sore throat.   Respiratory: Positive for cough, sputum production and shortness of breath.   Gastrointestinal: Positive for diarrhea.  All other systems reviewed and are negative.   Past Medical History:  Diagnosis Date  . Anxiety   . Breast cancer [facility]) August 2002   Invasive ductal carcinoma Left breast.  . Diabetes mellitus without complication ([place])   . Family history of malignant neoplasm of ovary   . Family history of pancreatic cancer   . Hypertension     Past Surgical History:  Procedure Laterality Date  . BREAST SURGERY Left 2003  . FOOT SURGERY  2000  . MASTECTOMY Left 05/28/2016  . port a cath  insertion  05/28/2016  . PORTACATH PLACEMENT Right 05/28/2016   Procedure: INSERTION PORT-A-CATH;  Surgeon: [name], MD;  Location: [place];  Service: General;  Laterality: Right;  . TOTAL MASTECTOMY Left 05/28/2016   Procedure: LEFT  MASTECTOMY;  Surgeon: [name], MD;  Location: [place];  Service: General;  Laterality: Left;  . TUBAL LIGATION  22 yrs since  2004.   Bilateral.    has History of left breast cancer; HTN (hypertension); DM type 2 (diabetes mellitus, type 2) ([place]); Recurrent cancer of left breast ([place]); Cancer of central portion of left breast ([place]); Port catheter in place; Antineoplastic chemotherapy induced anemia; Bronchitis; Hyponatremia; Diarrhea; and Unintentional weight loss on her problem list.    is allergic to codeine.    Medication List       Accurate as of 10/09/16 11:59 PM. Always use your most recent med list.          aspirin 81 MG tablet Take 81 mg by mouth daily.   dexamethasone 4 MG tablet Commonly known as:  DECADRON Take 1 tablet (4 mg total) by mouth 2 (two) times daily. Start the day before Taxotere. Then again the day after chemo for 3 days.   Fish Oil 1200 MG Caps Take 1 capsule by mouth daily.   furosemide 20 MG tablet Commonly known as:  LASIX Take 0.5 tablets (10 mg total) by mouth daily as needed.   glyBURIDE-metformin 2.5-500 MG tablet Commonly known as:  GLUCOVANCE Take 2 tablets by mouth 2 (two) times daily with a meal.   ibuprofen 200 MG tablet Commonly known as:  ADVIL,MOTRIN Take 200 mg by mouth every 8 (eight) hours as needed.   levofloxacin 500 MG tablet Commonly known as:  LEVAQUIN Take 1 tablet (500 mg total) by mouth daily.   lidocaine-prilocaine cream Commonly known as:  EMLA Apply to affected area once   lisinopril-hydrochlorothiazide 20-25 MG tablet Commonly known as:  PRINZIDE,ZESTORETIC Take 1 tablet by mouth daily.   ondansetron 8 MG tablet Commonly known as:  ZOFRAN Take 1 tablet (8 mg total) by  mouth 2 (two) times daily as needed for refractory nausea / vomiting. Start on day 3 after chemo.   Potassium Chloride ER 20 MEQ Tbcr Take 20 mEq by mouth daily.   prochlorperazine 10 MG tablet Commonly known as:  COMPAZINE Take 1 tablet (10 mg total) by mouth every 6 (six) hours as needed (Nausea or vomiting).   traMADol 50 MG tablet Commonly known as:  ULTRAM Take 2 tablets (100 mg total) by mouth every 6 (six) hours as needed for moderate pain.        PHYSICAL EXAMINATION  Oncology Vitals 10/09/2016 10/09/2016  Height - -  Weight - -  Weight (lbs) - -  BMI (kg/m2) - -  Temp 98.8 98.8  Pulse 103 100  Resp 20 20  Resp (Historical as of 06/30/12) - -  SpO2 100 100  BSA (m2) - -   BP Readings from Last 2 Encounters:  10/09/16 122/62  10/09/16 124/62    Physical Exam  Constitutional: She is oriented to person, place, and time and well-developed, well-nourished, and in no distress.  HENT:  Head: Normocephalic and atraumatic.  Patient has nasal congestion but no facial tenderness with palpation.  Posterior oropharynx with mild erythema but no exudate.  Patient does have some laryngitis today.  Eyes: Conjunctivae and EOM are normal. Pupils are equal, round, and reactive to light. Right eye exhibits no discharge. Left eye exhibits no discharge. No scleral icterus.  Neck: Normal range of motion. Neck supple. No JVD present. No tracheal deviation present. No thyromegaly present.  Cardiovascular: Normal rate, regular rhythm, normal heart sounds and intact distal pulses.   Pulmonary/Chest: Effort normal and breath sounds normal. No respiratory distress. She has no wheezes. She has no rales. She exhibits no tenderness.  Abdominal: Soft. Bowel sounds are normal. She exhibits no distension and no mass. There is no tenderness. There is no rebound and no guarding.  Musculoskeletal: Normal range of motion. She exhibits no edema or tenderness.  Lymphadenopathy:    She has no cervical  adenopathy.  Neurological: She is alert and oriented to person, place, and time. Gait normal.  Skin: Skin is warm and dry. No rash noted. No erythema. No pallor.  Psychiatric: Affect normal.  Nursing note and vitals reviewed.   LABORATORY DATA:. Appointment on 10/09/2016  Component Date Value Ref Range Status  . WBC 10/09/2016 4.3  3.9 - 10.3 10e3/uL Final  . NEUT# 10/09/2016 1.2* 1.5 - 6.5 10e3/uL Final  . HGB 10/09/2016 8.6* 11.6 - 15.9 g/dL Final  . HCT 10/09/2016 24.7* 34.8 - 46.6 % Final  . Platelets 10/09/2016 161  145 - 400 10e3/uL Final  . MCV 10/09/2016 101.6* 79.5 - 101.0 fL Final  . MCH 10/09/2016 35.4* 25.1 - 34.0 pg Final  . MCHC 10/09/2016 34.8  31.5 - 36.0 g/dL Final  . RBC 10/09/2016 2.43* 3.70 - 5.45 10e6/uL Final  . RDW 10/09/2016 19.5* 11.2 - 14.5 % Final  . lymph# 10/09/2016 1.9  0.9 - 3.3 10e3/uL Final  . MONO# 10/09/2016 1.2* 0.1 - 0.9 10e3/uL Final  . Eosinophils Absolute 10/09/2016 0.0  0.0 - 0.5 10e3/uL Final  . Basophils Absolute 10/09/2016 0.0  0.0 - 0.1 10e3/uL Final  . NEUT% 10/09/2016 29.0* 38.4 - 76.8 % Final  . LYMPH% 10/09/2016 43.5  14.0 - 49.7 % Final  . MONO% 10/09/2016 27.3* 0.0 - 14.0 % Final  . EOS% 10/09/2016 0.0  0.0 - 7.0 % Final  . BASO% 10/09/2016 0.2  0.0 - 2.0 % Final  . Sodium 10/09/2016 133* 136 - 145 mEq/L Final  . Potassium 10/09/2016 4.2  3.5 - 5.1 mEq/L Final  . Chloride 10/09/2016 108  98 - 109 mEq/L Final  . CO2 10/09/2016 16* 22 - 29 mEq/L Final  . Glucose 10/09/2016 124  70 - 140 mg/dl Final  . BUN 10/09/2016 23.0  7.0 - 26.0 mg/dL Final  . Creatinine 10/09/2016 0.9  0.6 - 1.1 mg/dL Final  . Total Bilirubin 10/09/2016 0.36  0.20 - 1.20 mg/dL Final  . Alkaline Phosphatase 10/09/2016 51  40 - 150 U/L Final  . AST 10/09/2016 20  5 - 34 U/L Final  . ALT 10/09/2016 23  0 - 55 U/L Final  . Total Protein 10/09/2016 7.8  6.4 - 8.3 g/dL Final  . Albumin 10/09/2016 3.4* 3.5 - 5.0 g/dL Final  . Calcium 10/09/2016 9.1  8.4 - 10.4  mg/dL Final  . Anion Gap 10/09/2016 10  3 - 11 mEq/L Final  . EGFR 10/09/2016 80* >90 ml/min/1.73 m2 Final  Hospital Outpatient Visit on 10/09/2016  Component Date Value Ref Range Status  . Order Confirmation 10/09/2016 ORDER PROCESSED BY BLOOD BANK   Final  . ABO/RH(D) 10/12/2016 A POS   Final  . Antibody Screen 10/12/2016 NEG   Final  . Sample Expiration 10/12/2016 10/12/2016   Final  . Unit Number 10/12/2016 [id number]   Final  . Blood Component Type 10/12/2016 RED CELLS,LR   Final  . Unit division 10/12/2016 00   Final  . Status of Unit 10/12/2016 ISSUED,FINAL   Final  . Transfusion Status 10/12/2016 OK TO TRANSFUSE   Final  . Crossmatch Result 10/12/2016 Compatible   Final  . Unit Number 10/12/2016 [id number]   Final  . Blood Component Type 10/12/2016 RED CELLS,LR   Final  . Unit division 10/12/2016 00   Final  . Status of Unit 10/12/2016 ISSUED,FINAL   Final  . Transfusion Status 10/12/2016 OK TO TRANSFUSE   Final  . Crossmatch Result 10/12/2016 Compatible   Final  . ABO/RH(D) 10/09/2016 A POS   Final    RADIOGRAPHIC STUDIES: No results found.  ASSESSMENT/PLAN:    Unintentional weight loss Patient has lost approximately 9 pounds since her last weight check.  Patient states that she has minimal appetite.  She feels mildly dehydrated today as well; but will receive 2 units of packed red blood cells today.  She was encouraged to eat multiple small meals throughout the day; and to push protein.  She should also, push fluids as well.  Hyponatremia Patient's sodium was 133 today.  Patient has history of chronic hyponatremia.  Will continue to monitor closely.  Diarrhea Patient states she had 5 episodes of diarrhea last night; but has been taking Imodium and all diarrhea symptoms has resolved at this point.  Bronchitis Patient states that all members of her family have been sick with colds and coughs recently.  She is complaining of URI symptoms, laryngitis, sore throat,  and a productive cough with thick yellow secretions.  Exam today reveals bilateral lung fields essentially clear; with no wheezes or cough noted.  No acute respiratory distress noted.  Vital signs are stable.  Patient was afebrile today.  Will prescribe Levaquin antibiotics for treatment of probable bronchitis symptoms.  She was encouraged to directly to the emergency department for any worsening symptoms whatsoever.  Antineoplastic chemotherapy induced anemia Patient last received chemotherapy on 09/28/2016.  Hemoglobin today has decreased down to 8.6; the patient is complaining of increased generalized fatigue and shortness of breath with any exertion whatsoever.  She will receive 2 units packed red blood cells while in the sickle cell clinic today.  Recurrent cancer of left breast [facility]) Patient received cycle 5 of her Taxotere/carboplatin/Herceptin/Perjeta chemotherapy regimen on 09/28/2016.  She is scheduled to return for labs only on 10/12/2016.  She is scheduled for labs, flush, visit, and chemotherapy again on 10/27/1999   Patient stated understanding of all instructions; and was in agreement with this plan of care. The patient knows to call the clinic with any problems, questions or concerns.   Total time spent with patient  was 25 minutes;  with greater than 75 percent of that time spent in face to face counseling regarding patient's symptoms,  and coordination of care and follow up.  Disclaimer:This dictation was prepared with Dragon/digital dictation along with [employer]. Any transcriptional errors that result from this process are unintentional.  Drue Second, NP 10/12/2016

## 2016-10-12 NOTE — Assessment & Plan Note (Signed)
Patient last received chemotherapy on 09/28/2016.  Hemoglobin today has decreased down to 8.6; the patient is complaining of increased generalized fatigue and shortness of breath with any exertion whatsoever.  She will receive 2 units packed red blood cells while in the sickle cell clinic today.

## 2016-10-12 NOTE — Assessment & Plan Note (Signed)
Patient's sodium was 133 today.  Patient has history of chronic hyponatremia.  Will continue to monitor closely.

## 2016-10-12 NOTE — Telephone Encounter (Signed)
Pt left v/m requesting a new diabetic meter; pts present meter not working; left v/m requesting cb; left v/m requesting pt to cb; pt last seen 09/24/16. Need to know what type meter pt is using, how often testing and what pharmacy pt using.

## 2016-10-12 NOTE — Assessment & Plan Note (Signed)
Patient states she had 5 episodes of diarrhea last night; but has been taking Imodium and all diarrhea symptoms has resolved at this point.

## 2016-10-12 NOTE — Assessment & Plan Note (Signed)
Patient has lost approximately 9 pounds since her last weight check.  Patient states that she has minimal appetite.  She feels mildly dehydrated today as well; but will receive 2 units of packed red blood cells today.  She was encouraged to eat multiple small meals throughout the day; and to push protein.  She should also, push fluids as well.

## 2016-10-13 MED ORDER — GLUCOSE BLOOD VI STRP: 1.0000 | ORAL_STRIP | Freq: Three times a day (TID) | 3 refills | Status: DC | PRN

## 2016-10-13 NOTE — Telephone Encounter (Signed)
Rx sent through [employer]  

## 2016-10-13 NOTE — Telephone Encounter (Signed)
PT called back and stated she test during meal times, uses [facility] in [place], and uses [employer] meter. Please call [phone] with any further questions.

## 2016-10-14 ENCOUNTER — Telehealth: Payer: Self-pay | Admitting: *Deleted

## 2016-10-14 NOTE — Telephone Encounter (Signed)
TCT patient to follow up on [facility] visit 10/09/16. Spoke with patient ans she states she is feeling much better. No specific issues ot concerns at this time.  She is aware of her up coming appts for cardiac echo and Dr. Haroldine Laws tomorrow 10/15/16 and her appts here at the cancer center on 10/26/16

## 2016-10-15 ENCOUNTER — Encounter (HOSPITAL_COMMUNITY): Payer: Self-pay | Admitting: Internal Medicine

## 2016-10-15 ENCOUNTER — Ambulatory Visit (HOSPITAL_COMMUNITY)
Admission: RE | Admit: 2016-10-15 | Discharge: 2016-10-15 | Disposition: A | Discharge status: Home / Self Care | Payer: Medicare PPO | Source: Ambulatory Visit | Attending: Internal Medicine | Admitting: Internal Medicine

## 2016-10-15 ENCOUNTER — Other Ambulatory Visit (HOSPITAL_COMMUNITY): Payer: Self-pay | Admitting: *Deleted

## 2016-10-15 ENCOUNTER — Ambulatory Visit (HOSPITAL_BASED_OUTPATIENT_CLINIC_OR_DEPARTMENT_OTHER)
Admission: RE | Admit: 2016-10-15 | Discharge: 2016-10-15 | Disposition: A | Discharge status: Home / Self Care | Payer: Medicare PPO | Source: Ambulatory Visit | Attending: Internal Medicine | Admitting: Internal Medicine

## 2016-10-15 VITALS — BP 138/76 | HR 105 | Wt 230.4 lb

## 2016-10-15 DIAGNOSIS — C50112 Malignant neoplasm of central portion of left female breast: Secondary | ICD-10-CM | POA: Diagnosis present

## 2016-10-15 DIAGNOSIS — Z9012 Acquired absence of left breast and nipple: Secondary | ICD-10-CM | POA: Diagnosis not present

## 2016-10-15 DIAGNOSIS — Z800 Family history of malignant neoplasm of digestive organs: Secondary | ICD-10-CM | POA: Insufficient documentation

## 2016-10-15 DIAGNOSIS — Z7984 Long term (current) use of oral hypoglycemic drugs: Secondary | ICD-10-CM | POA: Diagnosis not present

## 2016-10-15 DIAGNOSIS — Z8041 Family history of malignant neoplasm of ovary: Secondary | ICD-10-CM | POA: Insufficient documentation

## 2016-10-15 DIAGNOSIS — Z7982 Long term (current) use of aspirin: Secondary | ICD-10-CM | POA: Insufficient documentation

## 2016-10-15 DIAGNOSIS — Z87891 Personal history of nicotine dependence: Secondary | ICD-10-CM | POA: Insufficient documentation

## 2016-10-15 DIAGNOSIS — Z885 Allergy status to narcotic agent status: Secondary | ICD-10-CM | POA: Insufficient documentation

## 2016-10-15 DIAGNOSIS — I10 Essential (primary) hypertension: Secondary | ICD-10-CM | POA: Diagnosis not present

## 2016-10-15 DIAGNOSIS — Z923 Personal history of irradiation: Secondary | ICD-10-CM | POA: Insufficient documentation

## 2016-10-15 DIAGNOSIS — Z8042 Family history of malignant neoplasm of prostate: Secondary | ICD-10-CM | POA: Insufficient documentation

## 2016-10-15 DIAGNOSIS — Z853 Personal history of malignant neoplasm of breast: Secondary | ICD-10-CM | POA: Diagnosis not present

## 2016-10-15 DIAGNOSIS — Z9221 Personal history of antineoplastic chemotherapy: Secondary | ICD-10-CM | POA: Insufficient documentation

## 2016-10-15 DIAGNOSIS — E119 Type 2 diabetes mellitus without complications: Secondary | ICD-10-CM | POA: Insufficient documentation

## 2016-10-15 DIAGNOSIS — C50011 Malignant neoplasm of nipple and areola, right female breast: Secondary | ICD-10-CM

## 2016-10-15 DIAGNOSIS — Z79899 Other long term (current) drug therapy: Secondary | ICD-10-CM | POA: Diagnosis not present

## 2016-10-15 DIAGNOSIS — Z170 Estrogen receptor positive status [ER+]: Secondary | ICD-10-CM | POA: Diagnosis not present

## 2016-10-15 NOTE — Progress Notes (Signed)
*  PRELIMINARY RESULTS* Echocardiogram 2D Echocardiogram has been performed.  Lindsey Marquez 10/15/2016, 9:57 AM

## 2016-10-15 NOTE — Telephone Encounter (Signed)
Patient calls wanting to verify that order for new meter has been sent in.  I returned call and notified that the order has been taken care of.

## 2016-10-15 NOTE — Progress Notes (Signed)
CARDIO-ONCOLOGY CLINIC NOTE  Patient Care Team: [name], NP as PCP - General (Internal Medicine) [name], MD as Consulting Physician (General Surgery) 10/15/2016 Referring MD: Burr Medico  HISTORY OF PRESENTING ILLNESS:   Ms Brownley is a 65 y/o woman with h/o HTN, DM2 and breast CA referred by Dr. Burr Medico for enrollment into the [facility].   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 Screening mammogram on 04/17/2016 showed calcification in the left breast, and core needle biopsy showed invasive and in situ ductal carcinoma, grade 2, ER/PR positive, HER-2 amplified.   She feels well. Denies any history of known heart disease. Active with no CP or SOB. No palpitations. No edema.   She is s/p left mastectomy in 6/17. Began adjuvant chemotherapy on 07/07/16 with docetaxel, carboplatin, Herceptin and pejeta every 3 weeks, for 6 cycles, followed by Herceptin maintenance therapy for total of one year Doing reasonable well with chemo. Finishes up front therapy next week then switches to every 3 week Herceptin. Got 2u RBCs recently. No SOB or edema  Echo 05/27/16: EF 55-60% GLS -17.2%  Lateral s' 12.1 cm/sec  Echo 10/15/16: EF 60-65% GLS -17.7%  Lateral s' 13.1 cm/sec (reviewed personally)  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 [facility])   Staging form: Breast, AJCC 7th Edition     Clinical stage from 04/17/2016: Stage IA (T1c, N0, M0) - Signed by [name], MD on 05/07/2016     Pathologic stage from 05/28/2016: Stage IIA (T2, N0, cM0) - Signed by [name], MD on 06/11/2016 History of left breast cancer   Staging form: Breast, AJCC 7th Edition     Clinical: Stage IIIB (T4d, N1, cM0) - Signed by [name], MD on 12/14/2013     Pathologic: Stage IIIB (T4d, N1a, cM0) - Signed by [name], MD on 12/14/2013       History of  left breast cancer   09/12/2002 Imaging    CT scan show no evidence of disease apart from the left axilla      09/2002 -  Neo-Adjuvant Chemotherapy    TAC X4 cycles       01/10/2003 Surgery    The patient had left lumpectomy and axillary lymph node dissection which show residual breast cancer and a sentinel lymph node was positive      2004 -  Adjuvant Chemotherapy    Cytoxan with 5-FU x4 cycles (methotrexate added at cycle 4).      2004 -  Radiation Therapy    Adjuvant left breast radiation      12/2002 Receptors her2    ER, PR and HER-2 were all negative.       Cancer of central portion of left breast ([place])   04/13/2016 Mammogram    Scheduler coarse heterogeneous calcifications spanning an area of 3.7 cm in the lumpectomy site, indeterminate. Ultrasound of the left axilla was negative.      04/17/2016 Initial Diagnosis    Cancer of central portion of left breast ([place])      04/17/2016 Initial Biopsy    Left breast core needle biopsy showed invasive and in situ ductal carcinoma with calcification, grade 2.      04/17/2016 Receptors her2    Your 100% positive, PR 15% positive, strong staining, Ki-67 10%, HER-2 positive by IHC (3)      05/05/2016 Imaging  Bilateral breast MRI showed a 1.3 x 0.8 x 0.7 cm biopsy-proven ductal carcinoma in the region of the previous lumpectomy. Enlarged lobulated right inferior axillary lymph node with diffuse cortical thickening, biopsy is recommended.      05/15/2016 Pathology Results    right axilary node biopsy was negative for malignant cell       05/28/2016 Surgery    Simple left mastectomy      05/28/2016 Pathology Results    Left mastectomy showed invasive ductal carcinoma, grade 3, 3.6 cm, high grade DCIS, (+) LVI, surgical margins were negative. Tumor 3.6 cm, no lymph nodes identified.      06/25/2016 Imaging    Staging CT scan of the chest, abdomen and pelvis with contrast and a bone scan showed no evidence of metastasis.  Postoperative fluid collection in the left breast, likely a seroma or hematoma. Right axillary adenopathy, which was biopsied previously.       07/07/2016 -  Chemotherapy    Adjuvant chemotherapy with docetaxel, carboplatin, Herceptin and pejeta every 3 weeks, for 6 cycles, followed by Herceptin maintenance therapy for total of one year treatment         MEDICAL HISTORY:  Past Medical History:  Diagnosis Date  . Anxiety   . Breast cancer [facility]) August 2002   Invasive ductal carcinoma Left breast.  . Diabetes mellitus without complication ([place])   . Family history of malignant neoplasm of ovary   . Family history of pancreatic cancer   . Hypertension     SURGICAL HISTORY: Past Surgical History:  Procedure Laterality Date  . BREAST SURGERY Left 2003  . FOOT SURGERY  2000  . MASTECTOMY Left 05/28/2016  . port a cath insertion  05/28/2016  . PORTACATH PLACEMENT Right 05/28/2016   Procedure: INSERTION PORT-A-CATH;  Surgeon: [name], MD;  Location: [place];  Service: General;  Laterality: Right;  . TOTAL MASTECTOMY Left 05/28/2016   Procedure: LEFT  MASTECTOMY;  Surgeon: [name], MD;  Location: [place];  Service: General;  Laterality: Left;  . TUBAL LIGATION  22 yrs since  2004.   Bilateral.    SOCIAL HISTORY: Social History   Social History  . Marital status: Divorced    Spouse name: N/A  . Number of children: N/A  . Years of education: N/A   Occupational History  . Not on file.   Social History Main Topics  . Smoking status: Former Smoker    Packs/day: 0.10    Years: 7.00    Quit date: 12/01/1975  . Smokeless tobacco: Never Used  . Alcohol use No  . Drug use: No  . Sexual activity: Yes   Other Topics Concern  . Not on file   Social History Narrative  . No narrative on file    FAMILY HISTORY: Family History  Problem Relation Age of Onset  . Prostate cancer Father   . Ovarian cancer Maternal Aunt 46    deceased  . Pancreatic cancer Mother 90     deceased  . Cancer Maternal Aunt     2 other mat aunts; unk. primary in 21s; deceased  . Cancer Maternal Uncle     "bone ca"; unk. primary; deceased 45s  . Prostate cancer Maternal Uncle     deceased 39    ALLERGIES:  is allergic to codeine.  MEDICATIONS:  Current Outpatient Prescriptions  Medication Sig Dispense Refill  . aspirin 81 MG tablet Take 81 mg by mouth daily.     Marland Kitchen dexamethasone (DECADRON)  4 MG tablet Take 1 tablet (4 mg total) by mouth 2 (two) times daily. Start the day before Taxotere. Then again the day after chemo for 3 days. 30 tablet 0  . furosemide (LASIX) 20 MG tablet Take 0.5 tablets (10 mg total) by mouth daily as needed. 90 tablet 1  . glucose blood (ADVOCATE TEST) test strip 1 each by Other route 3 (three) times daily as needed for other. Use as instructed 300 each 3  . glyBURIDE-metformin (GLUCOVANCE) 2.5-500 MG tablet Take 2 tablets by mouth 2 (two) times daily with a meal. 360 tablet 1  . ibuprofen (ADVIL,MOTRIN) 200 MG tablet Take 200 mg by mouth every 8 (eight) hours as needed.      Marland Kitchen levofloxacin (LEVAQUIN) 500 MG tablet Take 1 tablet (500 mg total) by mouth daily. 10 tablet 0  . lidocaine-prilocaine (EMLA) cream Apply to affected area once 30 g 3  . lisinopril-hydrochlorothiazide (PRINZIDE,ZESTORETIC) 20-25 MG tablet Take 1 tablet by mouth daily. 90 tablet 1  . Omega-3 Fatty Acids (FISH OIL) 1200 MG CAPS Take 1 capsule by mouth daily.    . ondansetron (ZOFRAN) 8 MG tablet Take 1 tablet (8 mg total) by mouth 2 (two) times daily as needed for refractory nausea / vomiting. Start on day 3 after chemo. 30 tablet 1  . Potassium Chloride ER 20 MEQ TBCR Take 20 mEq by mouth daily. 30 tablet 1  . prochlorperazine (COMPAZINE) 10 MG tablet Take 1 tablet (10 mg total) by mouth every 6 (six) hours as needed (Nausea or vomiting). 30 tablet 1  . traMADol (ULTRAM) 50 MG tablet Take 2 tablets (100 mg total) by mouth every 6 (six) hours as needed for moderate pain. 20 tablet 0     No current facility-administered medications for this encounter.      PHYSICAL EXAMINATION: Vitals:   10/15/16 0952  BP: 138/76  Pulse: (!) 105  SpO2: 97%  Weight: 230 lb 6.4 oz (104.5 kg)   General:  Well appearing. No resp difficulty HEENT: normal Neck: supple. no JVD. Carotids 2+ bilat; no bruits. No lymphadenopathy or thryomegaly appreciated. Cor: PMI nondisplaced. Regular rate & rhythm. No rubs, gallops or murmurs. Lungs: clear Abdomen: soft, nontender, nondistended. No hepatosplenomegaly. No bruits or masses. Good bowel sounds. Extremities: no cyanosis, clubbing, rash, edema Neuro: alert & orientedx3, cranial nerves grossly intact. moves all 4 extremities w/o difficulty. Affect pleasant    LABORATORY DATA:  I have reviewed the data as listed CBC Latest Ref Rng & Units 10/09/2016 09/28/2016 09/24/2016  WBC 3.9 - 10.3 10e3/uL 4.3 6.2 9.6  Hemoglobin 11.6 - 15.9 g/dL 8.6(L) 8.4(L) 8.3 Repeated and verified X2.(L)  Hematocrit 34.8 - 46.6 % 24.7(L) 25.0(L) 25.2 Repeated and verified X2.(L)  Platelets 145 - 400 10e3/uL 161 103(L) 227.0   CMP Latest Ref Rng & Units 10/09/2016 09/28/2016 09/24/2016  Glucose 70 - 140 mg/dl 124 194(H) 96  BUN 7.0 - 26.0 mg/dL 23.0 13.3 13  Creatinine 0.6 - 1.1 mg/dL 0.9 0.8 0.77  Sodium 136 - 145 mEq/L 133(L) 138 140  Potassium 3.5 - 5.1 mEq/L 4.2 3.5 4.0  Chloride 96 - 112 mEq/L - - 101  CO2 22 - 29 mEq/L 16(L) 23 30  Calcium 8.4 - 10.4 mg/dL 9.1 8.5 9.4  Total Protein 6.4 - 8.3 g/dL 7.8 7.0 7.2  Total Bilirubin 0.20 - 1.20 mg/dL 0.36 0.52 0.3  Alkaline Phos 40 - 150 U/L 51 47 35(L)  AST 5 - 34 U/L 20 24 25  ALT 0 - 55 U/L _0 ASSESSMENT & PLAN:  65 year old African-American female, postmenopausal, history of left breast triple negative cancer in 2003, presented with mammogram discovered left breast cancer, triple positive.  1. Cancer of central portion of left breast, pT2NxM0, stage IIA, G3, triple positive -- s/p previous  treatment for breast CA (with TAC) in 2003 -- She has started therapy with adjuvant chemotherapy to include: anti-HER-2 agents TCHP (docetaxel, carbotaxol, Herceptin, pejeta), every 3 weeks for total of 6 cycles, followed by maintenance Herceptin every 3 weeks to complete 1 year treatment. --I reviewed echos personally. EF and Doppler parameters stable. No HF on exam. Continue Herceptin.    Glori Bickers, MD 10/15/2016

## 2016-10-15 NOTE — Patient Instructions (Signed)
Follow up and Echo with Dr.Bensimhon in 3 months  

## 2016-10-15 NOTE — Addendum Note (Signed)
Encounter addended by: Harvie Junior, CMA on: 10/15/2016 10:35 AM<BR>    Actions taken: Order list changed, Diagnosis association updated, Sign clinical note

## 2016-10-19 ENCOUNTER — Other Ambulatory Visit: Payer: Self-pay

## 2016-10-19 MED ORDER — ADVOCATE BLOOD GLUCOSE SYSTEM W/DEVICE KIT: PACK | 0 refills | Status: DC

## 2016-10-19 MED ORDER — GLUCOSE BLOOD VI STRP: 1.0000 | ORAL_STRIP | Freq: Three times a day (TID) | 3 refills | Status: DC | PRN

## 2016-10-19 NOTE — Telephone Encounter (Signed)
Error see previous note 10/12/16.

## 2016-10-19 NOTE — Telephone Encounter (Addendum)
Pt called and wants the advocate meter and test strips sent to [facility] mail order pharmacy; advised pt done as requested.

## 2016-10-19 NOTE — Telephone Encounter (Signed)
Pt left v/m requesting cb about advocate meter being sent to [facility]. I spoke with Randall Hiss at [facility] [employer] and they do not carry the advocate meter or test strips. Pt will need to ck with ins co to see which meter and test strips are covered,. Left v/m requesting pt to cb.

## 2016-10-19 NOTE — Addendum Note (Signed)
Addended by: Helene Shoe on: 10/19/2016 04:14 PM   Modules accepted: Orders

## 2016-10-21 NOTE — Progress Notes (Signed)
[place]  [phone] [phone]  Clinic Follow Up Note   Patient Care Team: [name], NP as PCP - General (Internal Medicine) [name], MD as Consulting Physician (General Surgery) 10/27/2016   CHIEF COMPLAINTS:  Follow up recurrent left breast cancer   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 [facility])   Staging form: Breast, AJCC 7th Edition     Clinical stage from 04/17/2016: Stage IA (T1c, N0, M0) - Signed by [name], MD on 05/07/2016     Pathologic stage from 05/28/2016: Stage IIA (T2, N0, cM0) - Signed by [name], MD on 06/11/2016 History of left breast cancer   Staging form: Breast, AJCC 7th Edition     Clinical: Stage IIIB (T4d, N1, cM0) - Signed by [name], MD on 12/14/2013     Pathologic: Stage IIIB (T4d, N1a, cM0) - Signed by [name], MD on 12/14/2013       History of left breast cancer   09/12/2002 Imaging    CT scan show no evidence of disease apart from the left axilla      09/2002 -  Neo-Adjuvant Chemotherapy    TAC X4 cycles       01/10/2003 Surgery    The patient had left lumpectomy and axillary lymph node dissection which show residual breast cancer and a sentinel lymph node was positive      2004 -  Adjuvant Chemotherapy    Cytoxan with 5-FU x4 cycles (methotrexate added at cycle 4).      2004 -  Radiation Therapy    Adjuvant left breast radiation      12/2002 Receptors her2    ER, PR and HER-2 were all negative.       Cancer of central portion of left breast ([place])   04/13/2016 Mammogram    Scheduler coarse heterogeneous calcifications spanning an area of 3.7 cm in the lumpectomy site, indeterminate. Ultrasound of the left axilla was negative.      04/17/2016 Initial Diagnosis    Cancer of central portion of left breast ([place])      04/17/2016 Initial Biopsy    Left breast core needle biopsy showed invasive and in situ ductal carcinoma with calcification, grade 2.      04/17/2016 Receptors her2    Your 100% positive, PR 15% positive, strong staining, Ki-67 10%, HER-2 positive by IHC (3)      05/05/2016 Imaging    Bilateral breast MRI showed a 1.3 x 0.8 x 0.7 cm biopsy-proven ductal carcinoma in the region of the previous lumpectomy. Enlarged lobulated right inferior axillary lymph node with diffuse cortical thickening, biopsy is recommended.      05/15/2016 Pathology Results    right axilary node biopsy was negative for malignant cell       05/28/2016 Surgery    Simple left mastectomy      05/28/2016 Pathology Results    Left mastectomy showed invasive ductal carcinoma, grade 3, 3.6 cm, high grade DCIS, (+) LVI, surgical margins were negative. Tumor 3.6 cm, no lymph nodes identified.      06/25/2016 Imaging    Staging CT scan of the chest, abdomen and pelvis with contrast and a bone scan showed no evidence of metastasis. Postoperative fluid collection in the left breast, likely a seroma or hematoma. Right axillary adenopathy, which was biopsied previously.       07/07/2016 -  Chemotherapy    Adjuvant chemotherapy with docetaxel, carboplatin, Herceptin and  every 3 weeks, for 6 cycles, followed by Herceptin maintenance therapy for total of one year treatment °  °  ° °   °HISTORY OF PRESENTING ILLNESS:  °Lindsey Marquez 65 y.o. female is here because of her recurrent left breast cancer. ° °She had a triple negative left breast cancer in 2003, underwent neoadjuvant chemotherapy, left lumpectomy and axillary lymph node dissection, adjuvant chemotherapy and radiation. She was seen by multiple medical oncologist in our practice in the past, last was seen by Dr. Gorsuch in 11/2013. ° °She has been doing well, and is compliant with annual screening mammogram. The screening mammogram on 04/17/2016 showed calcification in the left breast, and core needle biopsy showed invasive and in situ ductal carcinoma, grade 2, ER/PR positive, HER-2 amplified. She was referred to  seeing breast surgeon Dr. Tsuei and referred back to us.  ° °She feels well, she did not feel any lump in her breast or axillas latley. She denies any pain, dyspnea, or GI symptoms. She had right nipple rash and right axillary swelling in the past one week, after she drinks some diet tea with citric. Breast MRI showed an enlarged lobulated right inferior axillary lymph node with diffuse cortical thickening, in addition to the known left breast mass which measured 1.3 cm on MRI. ° °GYN HISTORY  °Menarchal: 12 °LMP: 2003 °Contraceptive: 20 °HRT: n/a  °G2P2: 40 yo Son and 36 yo son who died  ° °CURRENT THERAPY: adjuvant chemotherapy TCHP every 3 weeks, started on 07/07/2016 ° °INTERIM HISTORY: °Lindsey Marquez returns for follow up and 6th (last) cycle chemo. She is feeling weak and tired today. She last received a blood transfusion on 10/09/2016. She has had a cold over the last two weeks. She complains of hoarse voice and cough without sputum production. She denies fever or chills. She denies nausea or vomiting. Previously prescribed antibiotic did not help. She has not tried Mucinex. She has taken coricidin but it did not help.  ° °She is eating and drinking well. She denies weight loss. She reports diarrhea though this is not new. She denies blood in stool. She has had intermittent nose bleeding but attributes it to the dry heat at night. She has shortness of breath with exertion though this is unchanged. She denies shortness of breath while siting. She has some swelling of her feet though this is not new. She denies feeling light headed or dizzy.  ° °MEDICAL HISTORY:  °Past Medical History:  °Diagnosis Date  °• Anxiety   °• Breast cancer (HCC) August 2002  ° Invasive ductal carcinoma Left breast.  °• Diabetes mellitus without complication (HCC)   °• Family history of malignant neoplasm of ovary   °• Family history of pancreatic cancer   °• Hypertension   ° ° °SURGICAL HISTORY: °Past Surgical History:  °Procedure Laterality  Date  °• BREAST SURGERY Left 2003  °• FOOT SURGERY  2000  °• MASTECTOMY Left 05/28/2016  °• port a cath insertion  05/28/2016  °• PORTACATH PLACEMENT Right 05/28/2016  ° Procedure: INSERTION PORT-A-CATH;  Surgeon: Matthew Tsuei, MD;  Location: [facility] OR;  Service: General;  Laterality: Right;  °• TOTAL MASTECTOMY Left 05/28/2016  ° Procedure: LEFT  MASTECTOMY;  Surgeon: Matthew Tsuei, MD;  Location: [facility] OR;  Service: General;  Laterality: Left;  °• TUBAL LIGATION  22 yrs since  2004.  ° Bilateral.  ° ° °SOCIAL HISTORY: °Social History  ° °Social History  °• Marital status: Divorced  °  Spouse name: N/A  °•   Number of children: N/A  °• Years of education: N/A  ° °Occupational History  °• Not on file.  ° °Social History Main Topics  °• Smoking status: Former Smoker  °  Packs/day: 0.10  °  Years: 7.00  °  Quit date: 12/01/1975  °• Smokeless tobacco: Never Used  °• Alcohol use No  °• Drug use: No  °• Sexual activity: Yes  ° °Other Topics Concern  °• Not on file  ° °Social History Narrative  °• No narrative on file  ° °She is retired from school  ° °FAMILY HISTORY: °Family History  °Problem Relation Age of Onset  °• Prostate cancer Father   °• Ovarian cancer Maternal Aunt 59  °  deceased  °• Pancreatic cancer Mother 77  °  deceased  °• Cancer Maternal Aunt   °  2 other mat aunts; unk. primary in 70s; deceased  °• Cancer Maternal Uncle   °  "bone ca"; unk. primary; deceased 70s  °• Prostate cancer Maternal Uncle   °  deceased 79  ° ° °ALLERGIES:  is allergic to codeine. ° °MEDICATIONS:  °Current Outpatient Prescriptions  °Medication Sig Dispense Refill  °• aspirin 81 MG tablet Take 81 mg by mouth daily.     °• Blood Glucose Monitoring Suppl (ADVOCATE BLOOD GLUCOSE SYSTEM) w/Device KIT Check blood sugar three times a day and as instructed Dx E11.9 1 kit 0  °• dexamethasone (DECADRON) 4 MG tablet Take 1 tablet (4 mg total) by mouth 2 (two) times daily. Start the day before Taxotere. Then again the day after chemo for 3 days. 30  tablet 0  °• furosemide (LASIX) 20 MG tablet Take 0.5 tablets (10 mg total) by mouth daily as needed. 90 tablet 1  °• glucose blood (ADVOCATE TEST) test strip 1 each by Other route 3 (three) times daily as needed for other. Use as instructed 300 each 3  °• glyBURIDE-metformin (GLUCOVANCE) 2.5-500 MG tablet Take 2 tablets by mouth 2 (two) times daily with a meal. 360 tablet 1  °• ibuprofen (ADVIL,MOTRIN) 200 MG tablet Take 200 mg by mouth every 8 (eight) hours as needed.      °• levofloxacin (LEVAQUIN) 500 MG tablet Take 1 tablet (500 mg total) by mouth daily. 10 tablet 0  °• lidocaine-prilocaine (EMLA) cream Apply to affected area once 30 g 3  °• lisinopril-hydrochlorothiazide (PRINZIDE,ZESTORETIC) 20-25 MG tablet Take 1 tablet by mouth daily. 90 tablet 1  °• Omega-3 Fatty Acids (FISH OIL) 1200 MG CAPS Take 1 capsule by mouth daily.    °• ondansetron (ZOFRAN) 8 MG tablet Take 1 tablet (8 mg total) by mouth 2 (two) times daily as needed for refractory nausea / vomiting. Start on day 3 after chemo. 30 tablet 1  °• Potassium Chloride ER 20 MEQ TBCR Take 20 mEq by mouth daily. 30 tablet 1  °• prochlorperazine (COMPAZINE) 10 MG tablet Take 1 tablet (10 mg total) by mouth every 6 (six) hours as needed (Nausea or vomiting). 30 tablet 1  °• traMADol (ULTRAM) 50 MG tablet Take 2 tablets (100 mg total) by mouth every 6 (six) hours as needed for moderate pain. 20 tablet 0  ° °No current facility-administered medications for this visit.   ° ° °REVIEW OF SYSTEMS:   °Constitutional: Denies fevers, chills or abnormal night sweats. (+) malaise/fatigue, weakness °Eyes: Denies blurriness of vision, double vision or watery eyes °Ears, nose, mouth, throat, and face: Denies mucositis, sputum production, or sore throat. (+) intermittent nosebleeding °Respiratory: Denies   dyspnea or wheezes. (+) cough °Cardiovascular: Denies palpitation, chest discomfort or lower extremity swelling °Gastrointestinal:  Denies nausea, heartburn or change in  bowel habits °Skin: Denies abnormal skin rashes °Lymphatics: Denies new lymphadenopathy or easy bruising °Neurological:Denies numbness, tingling or new weaknesses °Behavioral/Psych: Mood is stable, no new changes  °All other systems were reviewed with the patient and are negative. ° °PHYSICAL EXAMINATION: °ECOG PERFORMANCE STATUS: 1 ° °Vitals:  ° 10/26/16 0942  °BP: (!) 135/54  °Pulse: (!) 125  °Resp: 18  °Temp: 98.4 °F (36.9 °C)  ° °Filed Weights  ° 10/26/16 0942  °Weight: 234 lb 1.6 oz (106.2 kg)  ° ° °GENERAL:alert, no distress and comfortable °SKIN: skin color, texture, turgor are normal, no rashes or significant lesions °EYES: normal, conjunctiva are pink and non-injected, sclera clear °OROPHARYNX:no exudate, no erythema and lips, buccal mucosa, and tongue normal  °NECK: supple, thyroid normal size, non-tender, without nodularity °LYMPH:  no palpable lymphadenopathy in the cervical, axillary or inguinal °LUNGS: clear to auscultation and percussion with normal breathing effort °HEART: regular rate & rhythm and no murmurs and no lower extremity edema °ABDOMEN:abdomen soft, non-tender and normal bowel sounds °Musculoskeletal:no cyanosis of digits and no clubbing  °PSYCH: alert & oriented x 3 with fluent speech °NEURO: no focal motor/sensory deficits ° ° °LABORATORY DATA:  °I have reviewed the data as listed °CBC Latest Ref Rng & Units 10/26/2016 10/09/2016 09/28/2016  °WBC 3.9 - 10.3 10e3/uL 6.8 4.3 6.2  °Hemoglobin 11.6 - 15.9 g/dL 9.7(L) 8.6(L) 8.4(L)  °Hematocrit 34.8 - 46.6 % 29.1(L) 24.7(L) 25.0(L)  °Platelets 145 - 400 10e3/uL 85(L) 161 103(L)  ° °CMP Latest Ref Rng & Units 10/26/2016 10/09/2016 09/28/2016  °Glucose 70 - 140 mg/dl 154(H) 124 194(H)  °BUN 7.0 - 26.0 mg/dL 14.4 23.0 13.3  °Creatinine 0.6 - 1.1 mg/dL 0.9 0.9 0.8  °Sodium 136 - 145 mEq/L 135(L) 133(L) 138  °Potassium 3.5 - 5.1 mEq/L 3.6 4.2 3.5  °Chloride 96 - 112 mEq/L - - -  °CO2 22 - 29 mEq/L 21(L) 16(L) 23  °Calcium 8.4 - 10.4 mg/dL 8.7 9.1  8.5  °Total Protein 6.4 - 8.3 g/dL 6.9 7.8 7.0  °Total Bilirubin 0.20 - 1.20 mg/dL 0.43 0.36 0.52  °Alkaline Phos 40 - 150 U/L 42 51 47  °AST 5 - 34 U/L 20 20 24  °ALT 0 - 55 U/L 21 23 30  ° ° ° °PATHOLOGY REPORT  °Diagnosis 04/17/2016 °Breast, left, needle core biopsy, inner mid breast °- INVASIVE DUCTAL CARCINOMA WITH CALCIFICATIONS, SEE COMMENT. °- DUCTAL CARCINOMA IN SITU WITH COMEDONECROSIS. °Microscopic Comment °While grading is best performed on the resection specimen the invasive carcinoma appears grade 2. Prognostic markers °will be ordered and reported in an addendum. Dr. Kish has reviewed the case. The case was called to [facility] on 04/20/2016. ° °Results: °HER2 - *EQUIVOCAL* (Her2 by IHC will be ordered.) °RATIO OF HER2/CEP17 SIGNALS 1.73 °AVERAGE HER2 COPY NUMBER PER CELL 4.15 °By immunohistochemistry, the tumor cells are positive for Her2 (3). ° °Results: °IMMUNOHISTOCHEMICAL AND MORPHOMETRIC ANALYSIS PERFORMED MANUALLY °Estrogen Receptor: 100%, POSITIVE, STRONG STAINING INTENSITY °Progesterone Receptor: 15%, POSITIVE, STRONG STAINING INTENSITY °Proliferation Marker Ki67: 10% ° °Diagnosis 05/15/2016 °Lymph node, needle/core biopsy, right axilla °- BENIGN REACTIVE LYMPH NODE. °- NO GRANULOMAS OR MALIGNANCY IDENTIFIED. °- SEE COMMENT. °Microscopic Comment °Internal departmental review obtained (Dr. Kish) with agreement. Results are ph[facility] to [facility] (05/18/16). (MEG:gt, 05/18/16) ° ° °Breast, simple mastectomy, Left 05/28/2016 °- INVASIVE   DUCTAL CARCINOMA, GRADE III/III, SPANNING 3.6 CM. °- DUCTAL CARCINOMA IN SITU, HIGH GRADE. °- LYMPHOVASCULAR INVASION IS IDENTIFIED. °- THE SURGICAL RESECTION MARGINS ARE NEGATIVE FOR CARCINOMA. °- SEE ONCOLOGY TABLE BELOW. °Microscopic Comment °BREAST, INVASIVE TUMOR, WITHOUT LYMPH NODES PRESENT °Specimen, including laterality : Left breast °Procedure: Simple mastectomy °Histologic type: Ductal with micropapillary  features °Grade: III °Tubule formation: 3 °Nuclear pleomorphism: 2 °Mitotic: 3 °Tumor size (gross measurement): 3.6 cm °Margins: Greater than 0.2 cm to all margins °Lymphovascular invasion: Present °Ductal carcinoma in situ: Present °Grade: High grade °Extensive intraductal component: Yes °Lobular neoplasia: Not identified °Tumor focality: Unifocal °Treatment effect: N/A °Extent of tumor: Confined to breast parenchyma. °Breast prognostic profile: [id number] °Estrogen receptor: 100%, strong staining intensity °Progesterone receptor: 15%, strong staining intensity °Her 2 neu: Amplification was detected by immunohistochemistry. °Ki-67: 10% °1 of 2 °FINAL for Diegel, Ginger D ([id number]) °Microscopic Comment(continued) °Non-neoplastic breast: No significant findings. °TNM: pT2, pNX (clinically recurrent). °Comments: In addition to the main tumor, there is ductal carcinoma in situ present in random tissue submitted from °the inferior lateral quadrant. °(JBK:kh 06-01-16) ° ° °RADIOGRAPHIC STUDIES: °I have personally reviewed the radiological images as listed and agreed with the findings in the report. °No results found. ° °ASSESSMENT & PLAN:  °65-year-old African-American female, postmenopausal, history of left breast triple negative cancer in 2003, presented with mammogram discovered left breast cancer, triple positive. ° °1. Cancer of central portion of left breast, pT2NxM0, stage IIA, G3, triple positive °-I previously reviewed her surgical pathology findings in great details with patient and her family member. It showed a 3.6 cm mass, grade 3, surgical margins were negative. She had no sentinel lymph node biopsy due to her prior history of axillary lymph node dissection. °-Her restaging CT scan of bone scan showed no evidence of distant metastasis, I reviewed with patient. °-We previously discussed the risk of cancer recurrence of this complete surgical resection. We reviewed that HER-2 positive with cancers are  more aggressive, with increased risk of metastasis.  °-Given the moderate to high risk of cancer recurrence, I recommend adjuvant chemotherapy to reduce her risk of cancer recurrence. I recommend chemotherapy and duo anti-HER-2 agents TCHP (docetaxel, carbotaxol, Herceptin, pejeta), every 3 weeks for total of 6 cycles, followed by maintenance Herceptin every 3 weeks to complete 1 year treatment. °-She has been tolerating chemo well overall so far  °-She has developed peripheral neuropathy, secondary to chemotherapy, we'll monitor closely, stable overall °-Lab results reviewed with her, hemoglobin 9.7 and platelets 85 today, we will hold 6th (last) cycle chemotherapy today to allow her cold symptoms and thrombocytopenia   °-will start her on Herceptin maintenance therapy after she completes chemotherapy next week °- she had mastectomy for stage II breast cacer, and previous radiation to left breast, no need post mastectomy adjuvant irradiation. °- will start her on aromatase inhibitor as adjuvant endocrine therapy when she recovers from chemotherapy.  ° °2. Anemia  °-Secondary to chemotherapy, her hemoglobin is 9.7, she has mild symptoms from anemia °-She last received a blood transfusion on 10/09/2016.  ° °2. DM and HTN  °-She'll continue follow-up with her primary care physician °-We discussed her blood pressure and glucose level may be impacted by chemotherapy, and dexamethasone she takes before and after chemotherapy. °-I strongly encouraged her to monitor her blood pressure and glucose at home, and consider increasing the dose of metformin and glyburide if needed °-her BP and blood glucose have been normal/well controlled lately  ° °3. Obesity  °-I encouraged   her to eat healthy and exercise regularly, she is willing to lose some weight after she completes chemotherapy. ° °4. Peripheral neuropathy, G1 °-Secondary to chemotherapy, especially docetaxel °-I encouraged her to take vitamin B complex. She does not feel  she needs medication for now. °-We'll continue monitoring closely, stable overall  ° °5. Cold symptoms °-Cold symptoms appear to be viral in nature as previous prescribed antibiotics did not resolve it °-Encouraged patient to increase fluid intake and rest to recover ° °Plan °-6th (last) cycle chemotherapy TCHP held today due to cold symptoms and thrombocytopenia, we'll rescheduled to next week °-I'll see her back in 4 weeks to start her on Herceptin maintenance, and aromatase inhibitor. ° °All questions were answered. The patient knows to call the clinic with any problems, questions or concerns. ° °I spent 20 minutes counseling the patient face to face. The total time spent in the appointment was 25 minutes and more than 50% was on counseling. ° °This document serves as a record of services personally performed by Yan Feng, MD. It was created on her behalf by Elizabeth Ashley, a trained medical scribe. The creation of this record is based on the scribe's personal observations and the provider's statements to them. This document has been checked and approved by the attending provider. ° °  ° Feng, Yan, MD °10/27/2016  ° ° °

## 2016-10-26 ENCOUNTER — Ambulatory Visit (HOSPITAL_BASED_OUTPATIENT_CLINIC_OR_DEPARTMENT_OTHER): Payer: Medicare PPO | Admitting: Hematology

## 2016-10-26 ENCOUNTER — Other Ambulatory Visit (HOSPITAL_BASED_OUTPATIENT_CLINIC_OR_DEPARTMENT_OTHER): Payer: Medicare PPO

## 2016-10-26 ENCOUNTER — Ambulatory Visit: Payer: Medicare PPO

## 2016-10-26 ENCOUNTER — Encounter: Payer: Self-pay | Admitting: Hematology

## 2016-10-26 ENCOUNTER — Telehealth: Payer: Self-pay | Admitting: *Deleted

## 2016-10-26 ENCOUNTER — Telehealth: Payer: Self-pay | Admitting: Hematology

## 2016-10-26 VITALS — BP 135/54 | HR 125 | Temp 98.4°F | Resp 18 | Ht 69.0 in | Wt 234.1 lb

## 2016-10-26 DIAGNOSIS — D6481 Anemia due to antineoplastic chemotherapy: Secondary | ICD-10-CM | POA: Diagnosis not present

## 2016-10-26 DIAGNOSIS — Z170 Estrogen receptor positive status [ER+]: Secondary | ICD-10-CM

## 2016-10-26 DIAGNOSIS — Z853 Personal history of malignant neoplasm of breast: Secondary | ICD-10-CM

## 2016-10-26 DIAGNOSIS — Z95828 Presence of other vascular implants and grafts: Secondary | ICD-10-CM

## 2016-10-26 DIAGNOSIS — G620 Drug-induced polyneuropathy: Secondary | ICD-10-CM

## 2016-10-26 DIAGNOSIS — C50112 Malignant neoplasm of central portion of left female breast: Secondary | ICD-10-CM | POA: Diagnosis not present

## 2016-10-26 DIAGNOSIS — Z452 Encounter for adjustment and management of vascular access device: Secondary | ICD-10-CM

## 2016-10-26 DIAGNOSIS — E119 Type 2 diabetes mellitus without complications: Secondary | ICD-10-CM | POA: Diagnosis not present

## 2016-10-26 DIAGNOSIS — I10 Essential (primary) hypertension: Secondary | ICD-10-CM

## 2016-10-26 LAB — COMPREHENSIVE METABOLIC PANEL
ALT: 21 U/L (ref 0–55)
AST: 20 U/L (ref 5–34)
Albumin: 3.2 g/dL — ABNORMAL LOW (ref 3.5–5.0)
Alkaline Phosphatase: 42 U/L (ref 40–150)
Anion Gap: 13 mEq/L — ABNORMAL HIGH (ref 3–11)
BUN: 14.4 mg/dL (ref 7.0–26.0)
CO2: 21 mEq/L — ABNORMAL LOW (ref 22–29)
Calcium: 8.7 mg/dL (ref 8.4–10.4)
Chloride: 101 mEq/L (ref 98–109)
Creatinine: 0.9 mg/dL (ref 0.6–1.1)
EGFR: 83 mL/min/{1.73_m2} — ABNORMAL LOW (ref >90)
Glucose: 154 mg/dl — ABNORMAL HIGH (ref 70–140)
Potassium: 3.6 mEq/L (ref 3.5–5.1)
Sodium: 135 mEq/L — ABNORMAL LOW (ref 136–145)
Total Bilirubin: 0.43 mg/dL (ref 0.20–1.20)
Total Protein: 6.9 g/dL (ref 6.4–8.3)

## 2016-10-26 LAB — CBC WITH DIFFERENTIAL/PLATELET
BASO%: 0.1 % (ref 0.0–2.0)
Basophils Absolute: 0 10*3/uL (ref 0.0–0.1)
EOS%: 1.2 % (ref 0.0–7.0)
Eosinophils Absolute: 0.1 10*3/uL (ref 0.0–0.5)
HCT: 29.1 % — ABNORMAL LOW (ref 34.8–46.6)
HGB: 9.7 g/dL — ABNORMAL LOW (ref 11.6–15.9)
LYMPH%: 45.3 % (ref 14.0–49.7)
MCH: 33.9 pg (ref 25.1–34.0)
MCHC: 33.3 g/dL (ref 31.5–36.0)
MCV: 101.7 fL — ABNORMAL HIGH (ref 79.5–101.0)
MONO#: 0.4 10*3/uL (ref 0.1–0.9)
MONO%: 5.6 % (ref 0.0–14.0)
NEUT#: 3.2 10*3/uL (ref 1.5–6.5)
NEUT%: 47.8 % (ref 38.4–76.8)
Platelets: 85 10*3/uL — ABNORMAL LOW (ref 145–400)
RBC: 2.86 10*6/uL — ABNORMAL LOW (ref 3.70–5.45)
RDW: 20.4 % — ABNORMAL HIGH (ref 11.2–14.5)
WBC: 6.8 10*3/uL (ref 3.9–10.3)
lymph#: 3.1 10*3/uL (ref 0.9–3.3)

## 2016-10-26 MED ORDER — SODIUM CHLORIDE 0.9 % IJ SOLN
10.0000 mL | INTRAMUSCULAR | Status: DC | PRN
  Administered 2016-10-26: 10 mL via INTRAVENOUS
  Filled 2016-10-26: qty 10

## 2016-10-26 MED ORDER — HEPARIN SOD (PORK) LOCK FLUSH 100 UNIT/ML IV SOLN
500.0000 [IU] | Freq: Once | INTRAVENOUS | Status: DC | PRN
  Filled 2016-10-26: qty 5

## 2016-10-26 MED ORDER — HEPARIN SOD (PORK) LOCK FLUSH 100 UNIT/ML IV SOLN
500.0000 [IU] | Freq: Once | INTRAVENOUS | Status: AC | PRN
  Administered 2016-10-26: 500 [IU] via INTRAVENOUS
  Filled 2016-10-26: qty 5

## 2016-10-26 NOTE — Telephone Encounter (Signed)
Appointments scheduled per 10/26/16 los. A copy of the  AVS report and appointment schedule was given to patient,per 10/26/16 los.  Message sent to chemo scheduler to be rescheduled, as per patient unable to start on 11/03/16 . Will adjust Follow up appointment, flush and labs after. Patient advised.

## 2016-10-26 NOTE — Patient Instructions (Signed)

## 2016-10-26 NOTE — Telephone Encounter (Signed)
Per LOS I have scheduled appts. Scheduler aware

## 2016-10-28 ENCOUNTER — Ambulatory Visit (INDEPENDENT_AMBULATORY_CARE_PROVIDER_SITE_OTHER): Payer: Medicare PPO | Admitting: Internal Medicine

## 2016-10-28 ENCOUNTER — Ambulatory Visit (INDEPENDENT_AMBULATORY_CARE_PROVIDER_SITE_OTHER)
Admission: RE | Admit: 2016-10-28 | Discharge: 2016-10-28 | Disposition: A | Discharge status: Home / Self Care | Payer: Medicare PPO | Source: Ambulatory Visit | Attending: Internal Medicine | Admitting: Internal Medicine

## 2016-10-28 ENCOUNTER — Encounter: Payer: Self-pay | Admitting: Internal Medicine

## 2016-10-28 VITALS — BP 130/62 | HR 114 | Temp 98.9°F | Wt 235.0 lb

## 2016-10-28 DIAGNOSIS — R05 Cough: Secondary | ICD-10-CM

## 2016-10-28 DIAGNOSIS — R0602 Shortness of breath: Secondary | ICD-10-CM

## 2016-10-28 DIAGNOSIS — R059 Cough, unspecified: Secondary | ICD-10-CM

## 2016-10-28 NOTE — Patient Instructions (Signed)
Cough, Adult Introduction A cough helps to clear your throat and lungs. A cough may last only 2-3 weeks (acute), or it may last longer than 8 weeks (chronic). Many different things can cause a cough. A cough may be a sign of an illness or another medical condition. Follow these instructions at home:  Pay attention to any changes in your cough.  Take medicines only as told by your doctor.  If you were prescribed an antibiotic medicine, take it as told by your doctor. Do not stop taking it even if you start to feel better.  Talk with your doctor before you try using a cough medicine.  Drink enough fluid to keep your pee (urine) clear or pale yellow.  If the air is dry, use a cold steam vaporizer or humidifier in your home.  Stay away from things that make you cough at work or at home.  If your cough is worse at night, try using extra pillows to raise your head up higher while you sleep.  Do not smoke, and try not to be around smoke. If you need help quitting, ask your doctor.  Do not have caffeine.  Do not drink alcohol.  Rest as needed. Contact a doctor if:  You have new problems (symptoms).  You cough up yellow fluid (pus).  Your cough does not get better after 2-3 weeks, or your cough gets worse.  Medicine does not help your cough and you are not sleeping well.  You have pain that gets worse or pain that is not helped with medicine.  You have a fever.  You are losing weight and you do not know why.  You have night sweats. Get help right away if:  You cough up blood.  You have trouble breathing.  Your heartbeat is very fast. This information is not intended to replace advice given to you by your health care provider. Make sure you discuss any questions you have with your health care provider. Document Released: 07/30/2011 Document Revised: 04/23/2016 Document Reviewed: 01/23/2015  2017 [employer]vier  

## 2016-10-28 NOTE — Progress Notes (Signed)
HPI  Pt presents to the clinic today with c/o runny nose, cough and shortness of breath. This started 2 weeks ago. She is blowing clear mucous out of her nose. The cough is nonproductive. The shortness of breath is intermittent but she denies chest pain. She denies fever, chills or body aches. She was given Levaquin x 7 days ago but the cancer center, but reports minimal improvement in her symptoms. She has tried Nyquil OTC without any relief. She has had sick contacts. She is also currently taking chemo for breast cancer. She has a history of DM 2, last A1C 8%, 08/2016. She is UTD on her flu and pneumonia vaccine.  Review of Systems        Past Medical History:  Diagnosis Date  . Anxiety   . Breast cancer [facility]) August 2002   Invasive ductal carcinoma Left breast.  . Diabetes mellitus without complication ([place])   . Family history of malignant neoplasm of ovary   . Family history of pancreatic cancer   . Hypertension     Family History  Problem Relation Age of Onset  . Prostate cancer Father   . Ovarian cancer Maternal Aunt 48    deceased  . Pancreatic cancer Mother 41    deceased  . Cancer Maternal Aunt     2 other mat aunts; unk. primary in 71s; deceased  . Cancer Maternal Uncle     "bone ca"; unk. primary; deceased 98s  . Prostate cancer Maternal Uncle     deceased 24    Social History   Social History  . Marital status: Divorced    Spouse name: N/A  . Number of children: N/A  . Years of education: N/A   Occupational History  . Not on file.   Social History Main Topics  . Smoking status: Former Smoker    Packs/day: 0.10    Years: 7.00    Quit date: 12/01/1975  . Smokeless tobacco: Never Used  . Alcohol use No  . Drug use: No  . Sexual activity: Yes   Other Topics Concern  . Not on file   Social History Narrative  . No narrative on file    Allergies  Allergen Reactions  . Codeine      Constitutional: Denies headache, fatigue, fever or abrupt weight  changes.  HEENT:  Pt reports runny nose. Denies eye redness, eye pain, pressure behind the eyes, facial pain, nasal congestion, ear pain, ringing in the ears, wax buildup, or bloody nose. Respiratory: Positive cough and shortness of breath. Denies difficulty breathing.  Cardiovascular: Denies chest pain, chest tightness, palpitations or swelling in the hands or feet.   No other specific complaints in a complete review of systems (except as listed in HPI above).  Objective:   BP 130/62   Pulse (!) 114   Temp 98.9 F (37.2 C) (Oral)   Wt 235 lb (106.6 kg)   SpO2 98%   BMI 34.70 kg/m  Wt Readings from Last 3 Encounters:  10/28/16 235 lb (106.6 kg)  10/26/16 234 lb 1.6 oz (106.2 kg)  10/15/16 230 lb 6.4 oz (104.5 kg)     General: Appears her stated age, obese in NAD. HEENT: Head: normal shape and size; Eyes: sclera white, no icterus, conjunctiva pink; Ears: Tm's gray and intact, normal light reflex; Throat/Mouth: Teeth present, mucosa pink and moist, no exudate noted, no lesions or ulcerations noted.  Neck: No cervical lymphadenopathy.  Cardiovascular: Tachycardic with normal rhythm.  Pulmonary/Chest: Normal effort with  bibasilar crackles noted. No respiratory distress. No wheezes or ronchi noted.       Assessment & Plan:   Cough and SOB:  She does not appear acutely ill but given persistent symptoms despite abx, will obtain chest xray today Get some rest and drink plenty of water Delsym as needed for cough Will follow up after chest xray and let you know if additional abx are needed  RTC as needed or if symptoms persist.   Webb Silversmith, NP

## 2016-10-29 ENCOUNTER — Telehealth: Payer: Self-pay | Admitting: Internal Medicine

## 2016-10-29 NOTE — Telephone Encounter (Signed)
Patient called back to get results.  I let patient know Regina's comments and patient voiced understanding.

## 2016-10-29 NOTE — Telephone Encounter (Signed)
Pt called back regarding xray- please call [phone]

## 2016-10-30 NOTE — Telephone Encounter (Signed)
Pt called to ck on meter from [employer]; advised was sent on 10/19/16 to pharmacy. Pt will ck with [place].

## 2016-11-02 ENCOUNTER — Other Ambulatory Visit: Payer: Medicare PPO

## 2016-11-02 ENCOUNTER — Ambulatory Visit: Payer: Medicare PPO

## 2016-11-04 ENCOUNTER — Ambulatory Visit (HOSPITAL_BASED_OUTPATIENT_CLINIC_OR_DEPARTMENT_OTHER): Payer: Medicare PPO

## 2016-11-04 ENCOUNTER — Other Ambulatory Visit (HOSPITAL_BASED_OUTPATIENT_CLINIC_OR_DEPARTMENT_OTHER): Payer: Medicare PPO

## 2016-11-04 VITALS — BP 132/59 | HR 100 | Temp 98.8°F | Resp 18

## 2016-11-04 DIAGNOSIS — C50112 Malignant neoplasm of central portion of left female breast: Secondary | ICD-10-CM

## 2016-11-04 DIAGNOSIS — Z5112 Encounter for antineoplastic immunotherapy: Secondary | ICD-10-CM | POA: Diagnosis not present

## 2016-11-04 DIAGNOSIS — Z5111 Encounter for antineoplastic chemotherapy: Secondary | ICD-10-CM | POA: Diagnosis not present

## 2016-11-04 LAB — COMPREHENSIVE METABOLIC PANEL
ALT: 22 U/L (ref 0–55)
AST: 19 U/L (ref 5–34)
Albumin: 3.3 g/dL — ABNORMAL LOW (ref 3.5–5.0)
Alkaline Phosphatase: 42 U/L (ref 40–150)
Anion Gap: 11 mEq/L (ref 3–11)
BUN: 14.4 mg/dL (ref 7.0–26.0)
CO2: 26 mEq/L (ref 22–29)
Calcium: 8.9 mg/dL (ref 8.4–10.4)
Chloride: 102 mEq/L (ref 98–109)
Creatinine: 0.8 mg/dL (ref 0.6–1.1)
EGFR: 84 mL/min/{1.73_m2} — ABNORMAL LOW (ref >90)
Glucose: 140 mg/dl (ref 70–140)
Potassium: 4.1 mEq/L (ref 3.5–5.1)
Sodium: 139 mEq/L (ref 136–145)
Total Bilirubin: 0.47 mg/dL (ref 0.20–1.20)
Total Protein: 7.3 g/dL (ref 6.4–8.3)

## 2016-11-04 LAB — CBC WITH DIFFERENTIAL/PLATELET
BASO%: 0.3 % (ref 0.0–2.0)
Basophils Absolute: 0 10*3/uL (ref 0.0–0.1)
EOS%: 0.4 % (ref 0.0–7.0)
Eosinophils Absolute: 0 10*3/uL (ref 0.0–0.5)
HCT: 28.4 % — ABNORMAL LOW (ref 34.8–46.6)
HGB: 9.6 g/dL — ABNORMAL LOW (ref 11.6–15.9)
LYMPH%: 36.2 % (ref 14.0–49.7)
MCH: 35.7 pg — ABNORMAL HIGH (ref 25.1–34.0)
MCHC: 33.8 g/dL (ref 31.5–36.0)
MCV: 105.6 fL — ABNORMAL HIGH (ref 79.5–101.0)
MONO#: 0.5 10*3/uL (ref 0.1–0.9)
MONO%: 7.1 % (ref 0.0–14.0)
NEUT#: 3.6 10*3/uL (ref 1.5–6.5)
NEUT%: 56 % (ref 38.4–76.8)
Platelets: 194 10*3/uL (ref 145–400)
RBC: 2.69 10*6/uL — ABNORMAL LOW (ref 3.70–5.45)
RDW: 23.8 % — ABNORMAL HIGH (ref 11.2–14.5)
WBC: 6.5 10*3/uL (ref 3.9–10.3)
lymph#: 2.3 10*3/uL (ref 0.9–3.3)

## 2016-11-04 MED ORDER — ACETAMINOPHEN 325 MG PO TABS
650.0000 mg | ORAL_TABLET | Freq: Once | ORAL | Status: AC
  Administered 2016-11-04: 650 mg via ORAL

## 2016-11-04 MED ORDER — DEXAMETHASONE SODIUM PHOSPHATE 10 MG/ML IJ SOLN
INTRAMUSCULAR | Status: AC
  Filled 2016-11-04: qty 1

## 2016-11-04 MED ORDER — PALONOSETRON HCL INJECTION 0.25 MG/5ML
0.2500 mg | Freq: Once | INTRAVENOUS | Status: AC
  Administered 2016-11-04: 0.25 mg via INTRAVENOUS

## 2016-11-04 MED ORDER — PALONOSETRON HCL INJECTION 0.25 MG/5ML
INTRAVENOUS | Status: AC
  Filled 2016-11-04: qty 5

## 2016-11-04 MED ORDER — SODIUM CHLORIDE 0.9% FLUSH
10.0000 mL | INTRAVENOUS | Status: DC | PRN
  Administered 2016-11-04: 10 mL
  Filled 2016-11-04: qty 10

## 2016-11-04 MED ORDER — HEPARIN SOD (PORK) LOCK FLUSH 100 UNIT/ML IV SOLN
500.0000 [IU] | Freq: Once | INTRAVENOUS | Status: AC | PRN
  Administered 2016-11-04: 500 [IU]
  Filled 2016-11-04: qty 5

## 2016-11-04 MED ORDER — TRASTUZUMAB CHEMO 150 MG IV SOLR
6.0000 mg/kg | Freq: Once | INTRAVENOUS | Status: AC
  Administered 2016-11-04: 630 mg via INTRAVENOUS
  Filled 2016-11-04: qty 30

## 2016-11-04 MED ORDER — SODIUM CHLORIDE 0.9 % IV SOLN
420.0000 mg | Freq: Once | INTRAVENOUS | Status: AC
  Administered 2016-11-04: 420 mg via INTRAVENOUS
  Filled 2016-11-04: qty 14

## 2016-11-04 MED ORDER — SODIUM CHLORIDE 0.9 % IV SOLN
706.2000 mg | Freq: Once | INTRAVENOUS | Status: AC
  Administered 2016-11-04: 710 mg via INTRAVENOUS
  Filled 2016-11-04: qty 71

## 2016-11-04 MED ORDER — DOCETAXEL CHEMO INJECTION 160 MG/16ML: 75.0000 mg/m2 | Freq: Once | INTRAVENOUS | Status: DC

## 2016-11-04 MED ORDER — ACETAMINOPHEN 325 MG PO TABS
ORAL_TABLET | ORAL | Status: AC
  Filled 2016-11-04: qty 2

## 2016-11-04 MED ORDER — DEXAMETHASONE SODIUM PHOSPHATE 10 MG/ML IJ SOLN
10.0000 mg | Freq: Once | INTRAMUSCULAR | Status: AC
  Administered 2016-11-04: 10 mg via INTRAVENOUS

## 2016-11-04 MED ORDER — SODIUM CHLORIDE 0.9 % IV SOLN: Freq: Once | INTRAVENOUS | Status: DC

## 2016-11-04 MED ORDER — DIPHENHYDRAMINE HCL 25 MG PO CAPS
50.0000 mg | ORAL_CAPSULE | Freq: Once | ORAL | Status: AC
  Administered 2016-11-04: 50 mg via ORAL

## 2016-11-04 MED ORDER — DOCETAXEL CHEMO INJECTION 160 MG/16ML
75.0000 mg/m2 | Freq: Once | INTRAVENOUS | Status: AC
  Administered 2016-11-04: 170 mg via INTRAVENOUS
  Filled 2016-11-04: qty 17

## 2016-11-04 MED ORDER — DIPHENHYDRAMINE HCL 25 MG PO CAPS
ORAL_CAPSULE | ORAL | Status: AC
  Filled 2016-11-04: qty 2

## 2016-11-04 MED ORDER — SODIUM CHLORIDE 0.9 % IV SOLN
Freq: Once | INTRAVENOUS | Status: AC
  Administered 2016-11-04: 10:00:00 via INTRAVENOUS

## 2016-11-04 NOTE — Patient Instructions (Signed)
[facility] Discharge Instructions for Patients Receiving Chemotherapy  Today you received the following chemotherapy agents: Taxotere, Carboplatin, Herceptin and   To help prevent nausea and vomiting after your treatment, we encourage you to take your nausea medication as directed.    If you develop nausea and vomiting that is not controlled by your nausea medication, call the clinic.   BELOW ARE SYMPTOMS THAT SHOULD BE REPORTED IMMEDIATELY:  *FEVER GREATER THAN 100.5 F  *CHILLS WITH OR WITHOUT FEVER  NAUSEA AND VOMITING THAT IS NOT CONTROLLED WITH YOUR NAUSEA MEDICATION  *UNUSUAL SHORTNESS OF BREATH  *UNUSUAL BRUISING OR BLEEDING  TENDERNESS IN MOUTH AND THROAT WITH OR WITHOUT PRESENCE OF ULCERS  *URINARY PROBLEMS  *BOWEL PROBLEMS  UNUSUAL RASH Items with * indicate a potential emergency and should be followed up as soon as possible.  Feel free to call the clinic you have any questions or concerns. The clinic phone number is [phone].  Please show the [facility] at check-in to the Emergency Department and triage nurse.

## 2016-11-06 ENCOUNTER — Telehealth: Payer: Self-pay | Admitting: *Deleted

## 2016-11-06 NOTE — Telephone Encounter (Signed)
  Oncology Nurse Navigator Documentation  Navigator Location: [facility] (11/06/16 0900)   )Navigator Encounter Type: Telephone (11/06/16 0900) Telephone: Lahoma Crocker Call (11/06/16 0900)                     Treatment Phase: Final Chemo TX (11/06/16 0900)                            Time Spent with Patient: 15 (11/06/16 0900)

## 2016-11-10 ENCOUNTER — Telehealth: Payer: Self-pay | Admitting: *Deleted

## 2016-11-10 ENCOUNTER — Other Ambulatory Visit: Payer: Self-pay | Admitting: Hematology

## 2016-11-10 DIAGNOSIS — C50112 Malignant neoplasm of central portion of left female breast: Secondary | ICD-10-CM

## 2016-11-10 NOTE — Telephone Encounter (Signed)
Changed 12/26 appt

## 2016-11-19 ENCOUNTER — Other Ambulatory Visit: Payer: Self-pay

## 2016-11-19 MED ORDER — TRUEPLUS LANCETS 33G MISC: 1.0000 | Freq: Three times a day (TID) | 2 refills | Status: DC

## 2016-11-19 MED ORDER — TRUE METRIX AIR GLUCOSE METER W/DEVICE KIT: 1.0000 | PACK | Freq: Once | 0 refills | Status: DC

## 2016-11-19 MED ORDER — GLUCOSE BLOOD VI STRP: 1.0000 | ORAL_STRIP | Freq: Three times a day (TID) | 2 refills | Status: DC

## 2016-11-24 ENCOUNTER — Other Ambulatory Visit (HOSPITAL_BASED_OUTPATIENT_CLINIC_OR_DEPARTMENT_OTHER): Payer: Medicare PPO

## 2016-11-24 ENCOUNTER — Telehealth: Payer: Self-pay | Admitting: *Deleted

## 2016-11-24 ENCOUNTER — Telehealth: Payer: Self-pay | Admitting: Hematology

## 2016-11-24 ENCOUNTER — Other Ambulatory Visit: Payer: Medicare PPO

## 2016-11-24 ENCOUNTER — Ambulatory Visit: Payer: Medicare PPO

## 2016-11-24 ENCOUNTER — Ambulatory Visit (HOSPITAL_BASED_OUTPATIENT_CLINIC_OR_DEPARTMENT_OTHER): Payer: Medicare PPO

## 2016-11-24 ENCOUNTER — Other Ambulatory Visit: Payer: Self-pay | Admitting: *Deleted

## 2016-11-24 ENCOUNTER — Ambulatory Visit (HOSPITAL_BASED_OUTPATIENT_CLINIC_OR_DEPARTMENT_OTHER): Payer: Medicare PPO | Admitting: Hematology

## 2016-11-24 ENCOUNTER — Ambulatory Visit (HOSPITAL_COMMUNITY)
Admission: RE | Admit: 2016-11-24 | Discharge: 2016-11-24 | Disposition: A | Discharge status: Home / Self Care | Payer: Medicare PPO | Source: Ambulatory Visit | Attending: Nurse Practitioner | Admitting: Nurse Practitioner

## 2016-11-24 VITALS — BP 123/65 | HR 97 | Temp 98.6°F | Resp 18

## 2016-11-24 DIAGNOSIS — G620 Drug-induced polyneuropathy: Secondary | ICD-10-CM

## 2016-11-24 DIAGNOSIS — Z170 Estrogen receptor positive status [ER+]: Secondary | ICD-10-CM

## 2016-11-24 DIAGNOSIS — C50112 Malignant neoplasm of central portion of left female breast: Secondary | ICD-10-CM

## 2016-11-24 DIAGNOSIS — I10 Essential (primary) hypertension: Secondary | ICD-10-CM

## 2016-11-24 DIAGNOSIS — C50912 Malignant neoplasm of unspecified site of left female breast: Secondary | ICD-10-CM | POA: Diagnosis not present

## 2016-11-24 DIAGNOSIS — Z853 Personal history of malignant neoplasm of breast: Secondary | ICD-10-CM

## 2016-11-24 DIAGNOSIS — R600 Localized edema: Secondary | ICD-10-CM

## 2016-11-24 DIAGNOSIS — Z5112 Encounter for antineoplastic immunotherapy: Secondary | ICD-10-CM | POA: Diagnosis not present

## 2016-11-24 DIAGNOSIS — Z95828 Presence of other vascular implants and grafts: Secondary | ICD-10-CM

## 2016-11-24 DIAGNOSIS — E119 Type 2 diabetes mellitus without complications: Secondary | ICD-10-CM

## 2016-11-24 DIAGNOSIS — D6481 Anemia due to antineoplastic chemotherapy: Secondary | ICD-10-CM | POA: Diagnosis not present

## 2016-11-24 DIAGNOSIS — E669 Obesity, unspecified: Secondary | ICD-10-CM

## 2016-11-24 LAB — COMPREHENSIVE METABOLIC PANEL
ALT: 17 U/L (ref 0–55)
AST: 19 U/L (ref 5–34)
Albumin: 3 g/dL — ABNORMAL LOW (ref 3.5–5.0)
Alkaline Phosphatase: 46 U/L (ref 40–150)
Anion Gap: 11 mEq/L (ref 3–11)
BUN: 15.8 mg/dL (ref 7.0–26.0)
CO2: 21 mEq/L — ABNORMAL LOW (ref 22–29)
Calcium: 8 mg/dL — ABNORMAL LOW (ref 8.4–10.4)
Chloride: 110 mEq/L — ABNORMAL HIGH (ref 98–109)
Creatinine: 1 mg/dL (ref 0.6–1.1)
EGFR: 65 mL/min/{1.73_m2} — ABNORMAL LOW (ref >90)
Glucose: 121 mg/dl (ref 70–140)
Potassium: 3.2 mEq/L — ABNORMAL LOW (ref 3.5–5.1)
Sodium: 142 mEq/L (ref 136–145)
Total Bilirubin: 0.36 mg/dL (ref 0.20–1.20)
Total Protein: 7.3 g/dL (ref 6.4–8.3)

## 2016-11-24 LAB — CBC WITH DIFFERENTIAL/PLATELET
BASO%: 0.4 % (ref 0.0–2.0)
Basophils Absolute: 0 10*3/uL (ref 0.0–0.1)
EOS%: 1 % (ref 0.0–7.0)
Eosinophils Absolute: 0.1 10*3/uL (ref 0.0–0.5)
HCT: 19.6 % — ABNORMAL LOW (ref 34.8–46.6)
HGB: 6.8 g/dL — CL (ref 11.6–15.9)
LYMPH%: 36.9 % (ref 14.0–49.7)
MCH: 35.1 pg — ABNORMAL HIGH (ref 25.1–34.0)
MCHC: 34.7 g/dL (ref 31.5–36.0)
MCV: 101 fL (ref 79.5–101.0)
MONO#: 0.3 10*3/uL (ref 0.1–0.9)
MONO%: 6.5 % (ref 0.0–14.0)
NEUT#: 2.7 10*3/uL (ref 1.5–6.5)
NEUT%: 55.2 % (ref 38.4–76.8)
Platelets: 77 10*3/uL — ABNORMAL LOW (ref 145–400)
RBC: 1.94 10*6/uL — ABNORMAL LOW (ref 3.70–5.45)
RDW: 20.9 % — ABNORMAL HIGH (ref 11.2–14.5)
WBC: 5 10*3/uL (ref 3.9–10.3)
lymph#: 1.8 10*3/uL (ref 0.9–3.3)
nRBC: 0 % (ref 0–0)

## 2016-11-24 LAB — PREPARE RBC (CROSSMATCH)

## 2016-11-24 MED ORDER — SODIUM CHLORIDE 0.9 % IV SOLN
Freq: Once | INTRAVENOUS | Status: AC
  Administered 2016-11-24: 10:00:00 via INTRAVENOUS

## 2016-11-24 MED ORDER — HEPARIN SOD (PORK) LOCK FLUSH 100 UNIT/ML IV SOLN
500.0000 [IU] | Freq: Once | INTRAVENOUS | Status: AC | PRN
  Administered 2016-11-24: 500 [IU]
  Filled 2016-11-24: qty 5

## 2016-11-24 MED ORDER — SODIUM CHLORIDE 0.9 % IJ SOLN
10.0000 mL | INTRAMUSCULAR | Status: DC | PRN
  Administered 2016-11-24: 10 mL via INTRAVENOUS
  Filled 2016-11-24: qty 10

## 2016-11-24 MED ORDER — DIPHENHYDRAMINE HCL 25 MG PO CAPS
50.0000 mg | ORAL_CAPSULE | Freq: Once | ORAL | Status: AC
  Administered 2016-11-24: 50 mg via ORAL

## 2016-11-24 MED ORDER — ACETAMINOPHEN 325 MG PO TABS
ORAL_TABLET | ORAL | Status: AC
  Filled 2016-11-24: qty 2

## 2016-11-24 MED ORDER — DIPHENHYDRAMINE HCL 25 MG PO CAPS
ORAL_CAPSULE | ORAL | Status: AC
  Filled 2016-11-24: qty 2

## 2016-11-24 MED ORDER — SODIUM CHLORIDE 0.9 % IV SOLN
250.0000 mL | Freq: Once | INTRAVENOUS | Status: AC
  Administered 2016-11-24: 250 mL via INTRAVENOUS

## 2016-11-24 MED ORDER — ACETAMINOPHEN 325 MG PO TABS
650.0000 mg | ORAL_TABLET | Freq: Once | ORAL | Status: AC
  Administered 2016-11-24: 650 mg via ORAL

## 2016-11-24 MED ORDER — TRASTUZUMAB CHEMO 150 MG IV SOLR
6.0000 mg/kg | Freq: Once | INTRAVENOUS | Status: AC
  Administered 2016-11-24: 651 mg via INTRAVENOUS
  Filled 2016-11-24: qty 31

## 2016-11-24 MED ORDER — SODIUM CHLORIDE 0.9% FLUSH
10.0000 mL | INTRAVENOUS | Status: DC | PRN
  Administered 2016-11-24: 10 mL
  Filled 2016-11-24: qty 10

## 2016-11-24 NOTE — Progress Notes (Signed)
Per Meredeth Ide, RN, per Dr. Burr Medico okay for patient to receive Herceptin today with Hgb 6.8. Lab to redraw CBC at bedside.

## 2016-11-24 NOTE — Telephone Encounter (Signed)
[facility] is capped and unable to accommodate patient for 1 unit of Red blood Cell transfusion on 11/25/16 or 11/26/16 , per Sharyn Lull and that I will need to schedule with [facility] [place] [facility] is closed today. Will call tomorrow, when reopens. Patient is aware. Labs will also be scheduled, once appointment made for Transfusion. Lab, flush, follow up and Chemo was scheduled, per 11/24/16 los. Patient was given a copy of the AVS report and appointment schedule, per 11/24/16 los.

## 2016-11-24 NOTE — Progress Notes (Signed)
[place]  [phone] [phone]  Clinic Follow Up Note   Patient Care Team: [name], NP as PCP - General (Internal Medicine) [name], MD as Consulting Physician (General Surgery) 11/24/2016   CHIEF COMPLAINTS:  Follow up recurrent left breast cancer   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 [facility])   Staging form: Breast, AJCC 7th Edition     Clinical stage from 04/17/2016: Stage IA (T1c, N0, M0) - Signed by [name], MD on 05/07/2016     Pathologic stage from 05/28/2016: Stage IIA (T2, N0, cM0) - Signed by [name], MD on 06/11/2016 History of left breast cancer   Staging form: Breast, AJCC 7th Edition     Clinical: Stage IIIB (T4d, N1, cM0) - Signed by [name], MD on 12/14/2013     Pathologic: Stage IIIB (T4d, N1a, cM0) - Signed by [name], MD on 12/14/2013       History of left breast cancer   09/12/2002 Imaging    CT scan show no evidence of disease apart from the left axilla      09/2002 -  Neo-Adjuvant Chemotherapy    TAC X4 cycles       01/10/2003 Surgery    The patient had left lumpectomy and axillary lymph node dissection which show residual breast cancer and a sentinel lymph node was positive      2004 -  Adjuvant Chemotherapy    Cytoxan with 5-FU x4 cycles (methotrexate added at cycle 4).      2004 -  Radiation Therapy    Adjuvant left breast radiation      12/2002 Receptors her2    ER, PR and HER-2 were all negative.       Cancer of central portion of left breast ([place])   04/13/2016 Mammogram    Scheduler coarse heterogeneous calcifications spanning an area of 3.7 cm in the lumpectomy site, indeterminate. Ultrasound of the left axilla was negative.      04/17/2016 Initial Diagnosis    Cancer of central portion of left breast ([place])      04/17/2016 Initial Biopsy    Left breast core needle biopsy showed invasive and in situ ductal carcinoma with calcification, grade 2.      04/17/2016 Receptors her2    Your 100% positive, PR 15% positive, strong staining, Ki-67 10%, HER-2 positive by IHC (3)      05/05/2016 Imaging    Bilateral breast MRI showed a 1.3 x 0.8 x 0.7 cm biopsy-proven ductal carcinoma in the region of the previous lumpectomy. Enlarged lobulated right inferior axillary lymph node with diffuse cortical thickening, biopsy is recommended.      05/15/2016 Pathology Results    right axilary node biopsy was negative for malignant cell       05/28/2016 Surgery    Simple left mastectomy      05/28/2016 Pathology Results    Left mastectomy showed invasive ductal carcinoma, grade 3, 3.6 cm, high grade DCIS, (+) LVI, surgical margins were negative. Tumor 3.6 cm, no lymph nodes identified.      06/25/2016 Imaging    Staging CT scan of the chest, abdomen and pelvis with contrast and a bone scan showed no evidence of metastasis. Postoperative fluid collection in the left breast, likely a seroma or hematoma. Right axillary adenopathy, which was biopsied previously.       07/07/2016 -  Chemotherapy    Adjuvant chemotherapy with docetaxel, carboplatin, Herceptin and pejeta  every 3 weeks, for 6 cycles, followed by Herceptin maintenance therapy for total of one year treatment         HISTORY OF PRESENTING ILLNESS:  Lindsey Marquez 65 y.o. female is here because of her recurrent left breast cancer.  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 She was seen by multiple medical oncologist in our practice in the past, last was seen by Dr. Alvy Bimler in 11/2013.  She has been doing well, and is compliant with annual screening mammogram. The screening mammogram on 04/17/2016 showed calcification in the left breast, and core needle biopsy showed invasive and in situ ductal carcinoma, grade 2, ER/PR positive, HER-2 amplified. She was referred to  seeing breast surgeon Dr. Georgette Dover and referred back to [place].   She feels well, she did not feel any lump in her breast or axillas latley. She denies any pain, dyspnea, or GI symptoms. She had right nipple rash and right axillary swelling in the past one week, after she drinks some diet tea with citric. Breast MRI showed an enlarged lobulated right inferior axillary lymph node with diffuse cortical thickening, in addition to the known left breast mass which measured 1.3 cm on MRI.  GYN HISTORY  Menarchal: 12 LMP: 2003 Contraceptive: 20 HRT: n/a  G2P2: 80 yo Son and 23 yo son who died   CURRENT THERAPY: adjuvant chemotherapy TCHP every 3 weeks, started on 07/07/2016, for 6 cycles, followed by maintenance Herceptin every 3 weeks to complete 1 year therapy  INTERIM HISTORY: Ms Gilbert returns for follow up and first dose Herceptin maintenance therapy. She tolerated last cycle chemotherapy well, but had worsening fatigue, and it took longer time to recover. She had mild diarrhea and nausea, recovered well. No other new complaints.  MEDICAL HISTORY:  Past Medical History:  Diagnosis Date  . Anxiety   . Breast cancer [facility]) August 2002   Invasive ductal carcinoma Left breast.  . Diabetes mellitus without complication ([place])   . Family history of malignant neoplasm of ovary   . Family history of pancreatic cancer   . Hypertension     SURGICAL HISTORY: Past Surgical History:  Procedure Laterality Date  . BREAST SURGERY Left 2003  . FOOT SURGERY  2000  . MASTECTOMY Left 05/28/2016  . port a cath insertion  05/28/2016  . PORTACATH PLACEMENT Right 05/28/2016   Procedure: INSERTION PORT-A-CATH;  Surgeon: [name], MD;  Location: [place];  Service: General;  Laterality: Right;  . TOTAL MASTECTOMY Left 05/28/2016   Procedure: LEFT  MASTECTOMY;  Surgeon: [name], MD;  Location: [place];  Service: General;  Laterality: Left;  . TUBAL LIGATION  22 yrs since  2004.   Bilateral.    SOCIAL  HISTORY: Social History   Social History  . Marital status: Divorced    Spouse name: N/A  . Number of children: N/A  . Years of education: N/A   Occupational History  . Not on file.   Social History Main Topics  . Smoking status: Former Smoker    Packs/day: 0.10    Years: 7.00    Quit date: 12/01/1975  . Smokeless tobacco: Never Used  . Alcohol use No  . Drug use: No  . Sexual activity: Yes   Other Topics Concern  . Not on file   Social History Narrative  . No narrative on file   She is retired from school  FAMILY HISTORY: Family History  Problem Relation Age of Onset  . Prostate cancer Father   . Ovarian cancer Maternal Aunt 21    deceased  . Pancreatic cancer Mother 24    deceased  . Cancer Maternal Aunt     2 other mat aunts; unk. primary in 40s; deceased  . Cancer Maternal Uncle     "bone ca"; unk. primary; deceased 46s  . Prostate cancer Maternal Uncle     deceased 26    ALLERGIES:  is allergic to codeine.  MEDICATIONS:  Current Outpatient Prescriptions  Medication Sig Dispense Refill  . aspirin 81 MG tablet Take 81 mg by mouth daily.     Marland Kitchen dexamethasone (DECADRON) 4 MG tablet Take 1 tablet (4 mg total) by mouth 2 (two) times daily. Start the day before Taxotere. Then again the day after chemo for 3 days. 30 tablet 0  . furosemide (LASIX) 20 MG tablet Take 0.5 tablets (10 mg total) by mouth daily as needed. 90 tablet 1  . glucose blood (TRUE METRIX BLOOD GLUCOSE TEST) test strip 1 each by Other route 3 (three) times daily. Use as instructed 600 each 2  . glyBURIDE-metformin (GLUCOVANCE) 2.5-500 MG tablet Take 2 tablets by mouth 2 (two) times daily with a meal. 360 tablet 1  . ibuprofen (ADVIL,MOTRIN) 200 MG tablet Take 200 mg by mouth every 8 (eight) hours as needed.      . lidocaine-prilocaine (EMLA) cream Apply to affected area once 30 g 3  . lisinopril-hydrochlorothiazide (PRINZIDE,ZESTORETIC) 20-25 MG tablet Take 1 tablet by mouth daily. 90 tablet 1   . Omega-3 Fatty Acids (FISH OIL) 1200 MG CAPS Take 1 capsule by mouth daily.    . ondansetron (ZOFRAN) 8 MG tablet Take 1 tablet (8 mg total) by mouth 2 (two) times daily as needed for refractory nausea / vomiting. Start on day 3 after chemo. 30 tablet 1  . Potassium Chloride ER 20 MEQ TBCR Take 20 mEq by mouth daily. 30 tablet 1  . prochlorperazine (COMPAZINE) 10 MG tablet Take 1 tablet (10 mg total) by mouth every 6 (six) hours as needed (Nausea or vomiting). 30 tablet 1  . traMADol (ULTRAM) 50 MG tablet Take 2 tablets (100 mg total) by mouth every 6 (six) hours as needed for moderate pain. 20 tablet 0  . TRUEPLUS LANCETS 33G MISC 1 each by Does not apply route 3 (three) times daily. 600 each 2   No current facility-administered medications for this visit.     REVIEW OF SYSTEMS:   Constitutional: Denies fevers, chills or abnormal night sweats Eyes: Denies blurriness of vision, double vision or watery eyes Ears, nose, mouth, throat, and face: Denies mucositis or sore throat Respiratory: Denies cough, dyspnea or wheezes Cardiovascular: Denies palpitation, chest discomfort or lower extremity swelling Gastrointestinal:  Denies nausea, heartburn or change in bowel habits Skin: Denies abnormal skin rashes Lymphatics: Denies new lymphadenopathy or easy bruising Neurological:Denies numbness, tingling or new weaknesses Behavioral/Psych: Mood is stable, no new changes  All other systems were reviewed with the patient and are negative.  PHYSICAL EXAMINATION: ECOG PERFORMANCE STATUS: 1 Blood pressure 139/64, pulse 96, respiratory rate 18, temperature 98.2, pulse ox 100% on room air. GENERAL:alert, no distress and comfortable SKIN: skin color, texture, turgor are normal, no rashes or significant lesions EYES: normal, conjunctiva are pink and non-injected, sclera clear OROPHARYNX:no exudate, no erythema and lips, buccal mucosa, and tongue normal  NECK: supple, thyroid normal size, non-tender,  without nodularity LYMPH:  no  palpable lymphadenopathy in the cervical, axillary or inguinal LUNGS: clear to auscultation and percussion with normal breathing effort HEART: regular rate & rhythm and no murmurs and no lower extremity edema ABDOMEN:abdomen soft, non-tender and normal bowel sounds Musculoskeletal:no cyanosis of digits and no clubbing  PSYCH: alert & oriented x 3 with fluent speech NEURO: no focal motor/sensory deficits, (+) decreased vibration sensation on both hands, no weakness. Breasts: Breast inspection showed left breast is surgically absent, surgical incision has healed well. Exam of the right breast and bilateral axillas reviewed no palpable mass or adenopathy. EXT:    LABORATORY DATA:  I have reviewed the data as listed CBC Latest Ref Rng & Units  11/24/2016 11/04/2016  WBC 3.9 - 10.3 10e3/uL  5.0 6.5  Hemoglobin 11.6 - 15.9 g/dL  6.8(LL) 9.6(L)  Hematocrit 34.8 - 46.6 %  19.6(L) 28.4(L)  Platelets 145 - 400 10e3/uL  77(L) 194   CMP Latest Ref Rng & Units  11/24/2016 11/04/2016  Glucose 70 - 140 mg/dl  121 140  BUN 7.0 - 26.0 mg/dL  15.8 14.4  Creatinine 0.6 - 1.1 mg/dL  1.0 0.8  Sodium 136 - 145 mEq/L  142 139  Potassium 3.5 - 5.1 mEq/L  3.2(L) 4.1  Chloride 96 - 112 mEq/L  - -  CO2 22 - 29 mEq/L  21(L) 26  Calcium 8.4 - 10.4 mg/dL  8.0(L) 8.9  Total Protein 6.4 - 8.3 g/dL  7.3 7.3  Total Bilirubin 0.20 - 1.20 mg/dL  0.36 0.47  Alkaline Phos 40 - 150 U/L  46 42  AST 5 - 34 U/L  19 19  ALT 0 - 55 U/L  17 22     PATHOLOGY REPORT  Diagnosis 04/17/2016 Breast, left, needle core biopsy, inner mid breast - INVASIVE DUCTAL CARCINOMA WITH CALCIFICATIONS, SEE COMMENT. - DUCTAL CARCINOMA IN SITU WITH COMEDONECROSIS. Microscopic Comment While grading is best performed on the resection specimen the invasive carcinoma appears grade 2. Prognostic markers will be ordered and reported in an addendum. Dr. Lyndon Code has reviewed the case. The case was called to [facility] on 04/20/2016.  Results: HER2 - *EQUIVOCAL* (Her2 by IHC will be ordered.) RATIO OF HER2/CEP17 SIGNALS 1.73 AVERAGE HER2 COPY NUMBER PER CELL 4.15 By immunohistochemistry, the tumor cells are positive for Her2 (3).  Results: IMMUNOHISTOCHEMICAL AND MORPHOMETRIC ANALYSIS PERFORMED MANUALLY Estrogen Receptor: 100%, POSITIVE, STRONG STAINING INTENSITY Progesterone Receptor: 15%, POSITIVE, STRONG STAINING INTENSITY Proliferation Marker Ki67: 10%  Diagnosis 05/15/2016 Lymph node, needle/core biopsy, right axilla - BENIGN REACTIVE LYMPH NODE. - NO GRANULOMAS OR MALIGNANCY IDENTIFIED. - SEE COMMENT. Microscopic Comment Internal departmental review obtained (Dr. Lyndon Code) with agreement. Results are phoned to [facility] (05/18/16). (MEG:gt, 05/18/16)   Breast, simple mastectomy, Left 05/28/2016 - INVASIVE DUCTAL CARCINOMA, GRADE III/III, SPANNING 3.6 CM. - DUCTAL CARCINOMA IN SITU, HIGH GRADE. - LYMPHOVASCULAR INVASION IS IDENTIFIED. - THE SURGICAL RESECTION MARGINS ARE NEGATIVE FOR CARCINOMA. - SEE ONCOLOGY TABLE BELOW. Microscopic Comment BREAST, INVASIVE TUMOR, WITHOUT LYMPH NODES PRESENT Specimen, including laterality : Left breast Procedure: Simple mastectomy Histologic type: Ductal with micropapillary features Grade: III Tubule formation: 3 Nuclear pleomorphism: 2 Mitotic: 3 Tumor size (gross measurement): 3.6 cm Margins: Greater than 0.2 cm to all margins Lymphovascular invasion: Present Ductal carcinoma in situ: Present Grade: High grade Extensive intraductal component: Yes Lobular neoplasia: Not identified Tumor focality: Unifocal Treatment effect: N/A Extent of tumor: Confined to breast parenchyma. Breast prognostic profile: [phone] Estrogen receptor: 100%, strong  staining intensity Progesterone receptor: 15%, strong staining intensity Her 2 neu: Amplification was detected by immunohistochemistry. Ki-67: 10% 1 of  2 FINAL for ZIGGY, REVELES ([id number]) Microscopic Comment(continued) Non-neoplastic breast: No significant findings. TNM: pT2, pNX (clinically recurrent). Comments: In addition to the main tumor, there is ductal carcinoma in situ present in random tissue submitted from the inferior lateral quadrant. (JBK:kh 06-01-16)   RADIOGRAPHIC STUDIES: I have personally reviewed the radiological images as listed and agreed with the findings in the report. Dg Chest 2 View  Result Date: 10/28/2016 CLINICAL DATA:  65 year old female with cough and shortness of breath for 2 weeks. Undergoing chemotherapy for breast cancer. Afebrile. Initial encounter. EXAM: CHEST  2 VIEW COMPARISON:  Chest radiographs 10/28/2010. FINDINGS: Right chest porta cath in place. Surgical clips about the left axilla. Sequelae of left mastectomy. Stable lung volumes since 2011. Normal cardiac size and mediastinal contours. Visualized tracheal air column is within normal limits. No pneumothorax, pulmonary edema, pleural effusion or confluent pulmonary opacity. No acute osseous abnormality identified. IMPRESSION: No acute cardiopulmonary abnormality. Electronically Signed   By: Genevie Ann M.D.   On: 10/28/2016 14:20    ASSESSMENT & PLAN:  65 year old African-American female, postmenopausal, history of left breast triple negative cancer in 2003, presented with mammogram discovered left breast cancer, triple positive.  1. Cancer of central portion of left breast, pT2NxM0, stage IIA, G3, triple positive -I reviewed her surgical pathology findings in great details with patient and her family member. It showed a 3.6 cm mass, grade 3, surgical margins were negative. She had no sentinel lymph node biopsy due to her prior history of axillary lymph node dissection. -Her restaging CT scan of bone scan showed no evidence of distant metastasis, I reviewed with patient. -We discussed the risk of cancer recurrence of this complete surgical  resection. We reviewed that HER-2 positive with cancers are more aggressive, with increased risk of metastasis.  -Given the moderate to high risk of cancer recurrence, I recommend adjuvant chemotherapy to reduce her risk of cancer recurrence. I recommend chemotherapy and duo anti-HER-2 agents TCHP (docetaxel, carbotaxol, Herceptin, pejeta), every 3 weeks for total of 6 cycles, followed by maintenance Herceptin every 3 weeks to complete 1 year treatment. -She has been tolerating chemo well overall so far  -She has developed peripheral neuropathy, secondary to chemotherapy, we'll monitor closely, stable overall -Lab results reviewed with her, she has worsening anemia, hemoglobin 6.8 today, will arrange blood transfusion in the next few days.  -will start Herceptin maintenance therapy, and continue every 3 weeks, to complete a 1 year treatment. --Given the strong ER and PR positivity, I do recommend adjuvant aromatase inhibitor to reduce her risk of cancer recurrence,  The potential benefit and side effects, which includes but not limited to, hot flash, skin and vaginal dryness, metabolic changes ( increased blood glucose, cholesterol, weight, etc.), slightly in increased risk of cardiovascular disease, cataracts, muscular and joint discomfort, osteopenia and osteoporosis, etc, were discussed with her in great details. She is interested, and we'll start in 3 weeks when she recovers better from chemo.  -Due to her prior left breast radiation and mastectomy, small size of tumor, she will not need postmastectomy and adjuvant radiation  2. Anemia  -Secondary to chemotherapy, her hemoglobin is 6.8, she has mild symptoms from anemia -I'll schedule her for blood transfusion in the next few days   2. DM and HTN  -She'll continue follow-up with her primary care physician -We discussed her blood pressure and  glucose level may be impacted by chemotherapy, and dexamethasone she takes before and after  chemotherapy. -I strongly encouraged her to monitor her blood pressure and glucose at home, and consider increasing the dose of metformin and glyburide if needed -her BP and blood glucose has been normal/well controlled lately   3. Obesity  -I encouraged her to eat healthy and exercise regularly, she is willing to lose some weight after she completes chemotherapy.  4. 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 She does not feel she needs medication for now. -We'll continue monitoring closely, if it gets worse, we'll consider dose reduction of docetaxel  Plan -Lab reviewed, we'll start Herceptin maintenance therapy today -2 units RBC blood transfusion in the next few days -I'll see her back in 3 weeks, and start her on AI   All questions were answered. The patient knows to call the clinic with any problems, questions or concerns.  I spent 20 minutes counseling the patient face to face. The total time spent in the appointment was 25 minutes and more than 50% was on counseling.     [name], MD 11/24/2016

## 2016-11-24 NOTE — Telephone Encounter (Signed)
Spoke with pt and gave pt appt date and time for Doppler study at 10 am at [facility].  Instructed pt to arrive in admitting at 0945 am for registration.  Reinforced that pt needs to keep blue blood bank armband on.  Pt will need second unit of blood on  11/25/16.   Pt understood that collaborative nurse will contact pt in the am of appt for blood transfusion - where and when.

## 2016-11-24 NOTE — Patient Instructions (Addendum)
[place] Discharge Instructions for Patients Receiving Chemotherapy  Today you received the following chemotherapy agents: Herceptin  To help prevent nausea and vomiting after your treatment, we encourage you to take your nausea medication as directed.   If you develop nausea and vomiting that is not controlled by your nausea medication, call the clinic.   BELOW ARE SYMPTOMS THAT SHOULD BE REPORTED IMMEDIATELY:  *FEVER GREATER THAN 100.5 F  *CHILLS WITH OR WITHOUT FEVER  NAUSEA AND VOMITING THAT IS NOT CONTROLLED WITH YOUR NAUSEA MEDICATION  *UNUSUAL SHORTNESS OF BREATH  *UNUSUAL BRUISING OR BLEEDING  TENDERNESS IN MOUTH AND THROAT WITH OR WITHOUT PRESENCE OF ULCERS  *URINARY PROBLEMS  *BOWEL PROBLEMS  UNUSUAL RASH Items with * indicate a potential emergency and should be followed up as soon as possible.  Feel free to call the clinic you have any questions or concerns. The clinic phone number is [phone].  Please show the [facility] at check-in to the Emergency Department and triage nurse.    Blood Transfusion , Adult A blood transfusion is a procedure in which you receive donated blood, including plasma, platelets, and red blood cells, through an IV tube. You may need a blood transfusion because of illness, surgery, or injury. The blood may come from a donor. You may also be able to donate blood for yourself (autologous blood donation) before a surgery if you know that you might require a blood transfusion. The blood given in a transfusion is made up of different types of cells. You may receive:  Red blood cells. These carry oxygen to the cells in the body.  White blood cells. These help you fight infections.  Platelets. These help your blood to clot.  Plasma. This is the liquid part of your blood and it helps with fluid imbalances. If you have hemophilia or another clotting disorder, you may also receive other types of blood  products. Tell a health care provider about:  Any allergies you have.  All medicines you are taking, including vitamins, herbs, eye drops, creams, and over-the-counter medicines.  Any problems you or family members have had with anesthetic medicines.  Any blood disorders you have.  Any surgeries you have had.  Any medical conditions you have, including any recent fever or cold symptoms.  Whether you are pregnant or may be pregnant.  Any previous reactions you have had during a blood transfusion. What are the risks? Generally, this is a safe procedure. However, problems may occur, including:  Having an allergic reaction to something in the donated blood. Hives and itching may be symptoms of this type of reaction.  Fever. This may be a reaction to the white blood cells in the transfused blood. Nausea or chest pain may accompany a fever.  Iron overload. This can happen from having many transfusions.  Transfusion-related acute lung injury (TRALI). This is a rare reaction that causes lung damage. The cause is not known.TRALI can occur within hours of a transfusion or several days later.  Sudden (acute) or delayed hemolytic reactions. This happens if your blood does not match the cells in your transfusion. Your body's defense system (immune system) may try to attack the new cells. This complication is rare. The symptoms include fever, chills, nausea, and low back pain or chest pain.  Infection or disease transmission. This is rare. What happens before the procedure?  You will have a blood test to determine your blood type. This is necessary to know what kind of blood  your body will accept and to match it to the donor blood.  If you are going to have a planned surgery, you may be able to do an autologous blood donation. This may be done in case you need to have a transfusion.  If you have had an allergic reaction to a transfusion in the past, you may be given medicine to help prevent  a reaction. This medicine may be given to you by mouth or through an IV tube.  You will have your temperature, blood pressure, and pulse monitored before the transfusion.  Follow instructions from your health care provider about eating and drinking restrictions.  Ask your health care provider about:  Changing or stopping your regular medicines. This is especially important if you are taking diabetes medicines or blood thinners.  Taking medicines such as aspirin and ibuprofen. These medicines can thin your blood. Do not take these medicines before your procedure if your health care provider instructs you not to. What happens during the procedure?  An IV tube will be inserted into one of your veins.  The bag of donated blood will be attached to your IV tube. The blood will then enter through your vein.  Your temperature, blood pressure, and pulse will be monitored regularly during the transfusion. This monitoring is done to detect early signs of a transfusion reaction.  If you have any signs or symptoms of a reaction, your transfusion will be stopped and you may be given medicine.  When the transfusion is complete, your IV tube will be removed.  Pressure may be applied to the IV site for a few minutes.  A bandage (dressing) will be applied. The procedure may vary among health care providers and hospitals. What happens after the procedure?  Your temperature, blood pressure, heart rate, breathing rate, and blood oxygen level will be monitored often.  Your blood may be tested to see how you are responding to the transfusion.  You may be warmed with fluids or blankets to maintain a normal body temperature. Summary  A blood transfusion is a procedure in which you receive donated blood, including plasma, platelets, and red blood cells, through an IV tube.  Your temperature, blood pressure, and pulse will be monitored before, during, and after the transfusion.  Your blood may be tested  after the transfusion to see how your body has responded. This information is not intended to replace advice given to you by your health care provider. Make sure you discuss any questions you have with your health care provider. Document Released: 11/13/2000 Document Revised: 08/13/2016 Document Reviewed: 08/13/2016 Elsevier Interactive Patient Education  2017 [employer].

## 2016-11-25 ENCOUNTER — Telehealth: Payer: Self-pay | Admitting: Hematology

## 2016-11-25 ENCOUNTER — Ambulatory Visit (HOSPITAL_COMMUNITY)
Admission: RE | Admit: 2016-11-25 | Discharge: 2016-11-25 | Disposition: A | Discharge status: Home / Self Care | Payer: Medicare PPO | Source: Ambulatory Visit | Attending: Internal Medicine | Admitting: Internal Medicine

## 2016-11-25 DIAGNOSIS — C50912 Malignant neoplasm of unspecified site of left female breast: Secondary | ICD-10-CM | POA: Diagnosis not present

## 2016-11-25 DIAGNOSIS — C50112 Malignant neoplasm of central portion of left female breast: Secondary | ICD-10-CM | POA: Insufficient documentation

## 2016-11-25 DIAGNOSIS — M7989 Other specified soft tissue disorders: Secondary | ICD-10-CM | POA: Diagnosis not present

## 2016-11-25 NOTE — Progress Notes (Signed)
**  Preliminary report by tech**  Bilateral lower extremity venous duplex completed. There is no obvious evidence of deep or superficial vein thrombosis involving the right and left lower extremities. All clearly visualized vessels appear patent and compressible. There is no evidence of Baker's cysts bilaterally. Results were left with Dr. Burr Medico.  11/25/16 10:29 AM Lindsey Marquez RVT

## 2016-11-25 NOTE — Telephone Encounter (Signed)
Spoke with Gwen @ [facility] @ ext [phone], patient scheduled for next available, which is 11/26/16 @ 9 a.m. Labs scheduled @ 8 a.m. Appointments confirmed with patient. 11/25/16

## 2016-11-26 ENCOUNTER — Other Ambulatory Visit: Payer: Self-pay | Admitting: *Deleted

## 2016-11-26 ENCOUNTER — Ambulatory Visit (HOSPITAL_COMMUNITY)
Admission: RE | Admit: 2016-11-26 | Discharge: 2016-11-26 | Disposition: A | Discharge status: Home / Self Care | Payer: Medicare PPO | Source: Ambulatory Visit | Attending: Hematology | Admitting: Hematology

## 2016-11-26 ENCOUNTER — Other Ambulatory Visit (HOSPITAL_BASED_OUTPATIENT_CLINIC_OR_DEPARTMENT_OTHER): Payer: Medicare PPO

## 2016-11-26 ENCOUNTER — Telehealth: Payer: Self-pay | Admitting: *Deleted

## 2016-11-26 DIAGNOSIS — C50912 Malignant neoplasm of unspecified site of left female breast: Secondary | ICD-10-CM

## 2016-11-26 DIAGNOSIS — Z170 Estrogen receptor positive status [ER+]: Principal | ICD-10-CM

## 2016-11-26 DIAGNOSIS — T451X5A Adverse effect of antineoplastic and immunosuppressive drugs, initial encounter: Secondary | ICD-10-CM

## 2016-11-26 DIAGNOSIS — C50112 Malignant neoplasm of central portion of left female breast: Secondary | ICD-10-CM

## 2016-11-26 DIAGNOSIS — D6481 Anemia due to antineoplastic chemotherapy: Secondary | ICD-10-CM

## 2016-11-26 LAB — COMPREHENSIVE METABOLIC PANEL
ALT: 14 U/L (ref 0–55)
AST: 20 U/L (ref 5–34)
Albumin: 2.9 g/dL — ABNORMAL LOW (ref 3.5–5.0)
Alkaline Phosphatase: 49 U/L (ref 40–150)
Anion Gap: 12 mEq/L — ABNORMAL HIGH (ref 3–11)
BUN: 14.7 mg/dL (ref 7.0–26.0)
CO2: 22 mEq/L (ref 22–29)
Calcium: 7.8 mg/dL — ABNORMAL LOW (ref 8.4–10.4)
Chloride: 109 mEq/L (ref 98–109)
Creatinine: 1 mg/dL (ref 0.6–1.1)
EGFR: 65 mL/min/{1.73_m2} — ABNORMAL LOW (ref >90)
Glucose: 132 mg/dl (ref 70–140)
Potassium: 3.5 mEq/L (ref 3.5–5.1)
Sodium: 142 mEq/L (ref 136–145)
Total Bilirubin: 0.32 mg/dL (ref 0.20–1.20)
Total Protein: 7.3 g/dL (ref 6.4–8.3)

## 2016-11-26 LAB — CBC WITH DIFFERENTIAL/PLATELET
BASO%: 0.3 % (ref 0.0–2.0)
Basophils Absolute: 0 10*3/uL (ref 0.0–0.1)
EOS%: 2 % (ref 0.0–7.0)
Eosinophils Absolute: 0.1 10*3/uL (ref 0.0–0.5)
HCT: 24.3 % — ABNORMAL LOW (ref 34.8–46.6)
HGB: 8.3 g/dL — ABNORMAL LOW (ref 11.6–15.9)
LYMPH%: 38.5 % (ref 14.0–49.7)
MCH: 34.8 pg — ABNORMAL HIGH (ref 25.1–34.0)
MCHC: 34.3 g/dL (ref 31.5–36.0)
MCV: 101.5 fL — ABNORMAL HIGH (ref 79.5–101.0)
MONO#: 0.3 10*3/uL (ref 0.1–0.9)
MONO%: 8 % (ref 0.0–14.0)
NEUT#: 2.2 10*3/uL (ref 1.5–6.5)
NEUT%: 51.2 % (ref 38.4–76.8)
Platelets: 81 10*3/uL — ABNORMAL LOW (ref 145–400)
RBC: 2.4 10*6/uL — ABNORMAL LOW (ref 3.70–5.45)
RDW: 25 % — ABNORMAL HIGH (ref 11.2–14.5)
WBC: 4.3 10*3/uL (ref 3.9–10.3)
lymph#: 1.7 10*3/uL (ref 0.9–3.3)

## 2016-11-26 LAB — PREPARE RBC (CROSSMATCH)

## 2016-11-26 MED ORDER — SODIUM CHLORIDE 0.9 % IV SOLN
250.0000 mL | Freq: Once | INTRAVENOUS | Status: AC
  Administered 2016-11-26: 250 mL via INTRAVENOUS

## 2016-11-26 MED ORDER — SODIUM CHLORIDE 0.9% FLUSH
10.0000 mL | INTRAVENOUS | Status: AC | PRN
  Administered 2016-11-26: 10 mL

## 2016-11-26 MED ORDER — HEPARIN SOD (PORK) LOCK FLUSH 100 UNIT/ML IV SOLN
500.0000 [IU] | Freq: Every day | INTRAVENOUS | Status: AC | PRN
  Administered 2016-11-26: 500 [IU]
  Filled 2016-11-26: qty 5

## 2016-11-26 NOTE — Telephone Encounter (Signed)
Pt called stating that her legs still swollen and requested something for swelling.  Noted that pt has Lasix 10 mg daily PRN by PCP.   Instructed pt she could increase Lasix to 20 mg, and to call her PCP for further instructions.  Pt voiced understanding.

## 2016-11-26 NOTE — Progress Notes (Signed)
Diagnosis Association: Malignant neoplasm of left breast in female, estrogen receptor positive, unspecified site of breast ([place]) (C50.912 , Z17.0)  Provider: Darreld Mclean. Feng  Procedure: Pt's porta cath was accessed and pt received 1 unit of PRBCs .  Pt tolerated well.  Post procedure: Pt alert, oriented and ambulatory. D/C instructions given with verbal understanding

## 2016-11-27 LAB — TYPE AND SCREEN
Blood Product Expiration Date: 201801022359
Blood Product Expiration Date: 201801022359
ISSUE DATE / TIME: 201712261124
ISSUE DATE / TIME: 201712280911
Unit Type and Rh: 6200
Unit Type and Rh: 6200

## 2016-12-01 ENCOUNTER — Other Ambulatory Visit: Payer: Self-pay | Admitting: *Deleted

## 2016-12-01 MED ORDER — POTASSIUM CHLORIDE ER 20 MEQ PO TBCR: 20.0000 meq | EXTENDED_RELEASE_TABLET | Freq: Every day | ORAL | 1 refills | Status: DC

## 2016-12-01 NOTE — Telephone Encounter (Signed)
Received call from pt stating that both feet & legs are swollen & making it hard for her to walk.  She states that if she elevates her legs & wears shoes she does OK.  She states that she was checked for a clot & this was negative.  She states that she has doubled up on her lasix over last couple of days-10 mg bid.  She is taking her BP meds & drinking plenty of fluids.  Discusses with Dr Burr Medico & pt encouraged to get compression socks & apply, OK to take lasix daily & make sure taking K+ with it ( called in script), & to f/u with PCP for further instructions.

## 2016-12-03 ENCOUNTER — Ambulatory Visit: Payer: Medicare PPO | Admitting: Internal Medicine

## 2016-12-03 NOTE — Progress Notes (Deleted)
Subjective:    Patient ID: Lindsey Marquez, female    DOB: [birth year], 66 y.o.   MRN: [id number]  HPI  Pt presents to the clinic today with c/o swelling in her feet. She is taking Lisinopril-HCT and Lasix as prescribed.  Review of Systems  Past Medical History:  Diagnosis Date  . Anxiety   . Breast cancer [facility]) August 2002   Invasive ductal carcinoma Left breast.  . Diabetes mellitus without complication ([place])   . Family history of malignant neoplasm of ovary   . Family history of pancreatic cancer   . Hypertension     Current Outpatient Prescriptions  Medication Sig Dispense Refill  . aspirin 81 MG tablet Take 81 mg by mouth daily.     Marland Kitchen dexamethasone (DECADRON) 4 MG tablet Take 1 tablet (4 mg total) by mouth 2 (two) times daily. Start the day before Taxotere. Then again the day after chemo for 3 days. 30 tablet 0  . furosemide (LASIX) 20 MG tablet Take 0.5 tablets (10 mg total) by mouth daily as needed. 90 tablet 1  . glucose blood (TRUE METRIX BLOOD GLUCOSE TEST) test strip 1 each by Other route 3 (three) times daily. Use as instructed 600 each 2  . glyBURIDE-metformin (GLUCOVANCE) 2.5-500 MG tablet Take 2 tablets by mouth 2 (two) times daily with a meal. 360 tablet 1  . ibuprofen (ADVIL,MOTRIN) 200 MG tablet Take 200 mg by mouth every 8 (eight) hours as needed.      . lidocaine-prilocaine (EMLA) cream Apply to affected area once 30 g 3  . lisinopril-hydrochlorothiazide (PRINZIDE,ZESTORETIC) 20-25 MG tablet Take 1 tablet by mouth daily. 90 tablet 1  . Omega-3 Fatty Acids (FISH OIL) 1200 MG CAPS Take 1 capsule by mouth daily.    . ondansetron (ZOFRAN) 8 MG tablet Take 1 tablet (8 mg total) by mouth 2 (two) times daily as needed for refractory nausea / vomiting. Start on day 3 after chemo. 30 tablet 1  . Potassium Chloride ER 20 MEQ TBCR Take 20 mEq by mouth daily. 30 tablet 1  . prochlorperazine (COMPAZINE) 10 MG tablet Take 1 tablet (10 mg total) by mouth every 6 (six) hours  as needed (Nausea or vomiting). 30 tablet 1  . traMADol (ULTRAM) 50 MG tablet Take 2 tablets (100 mg total) by mouth every 6 (six) hours as needed for moderate pain. 20 tablet 0  . TRUEPLUS LANCETS 33G MISC 1 each by Does not apply route 3 (three) times daily. 600 each 2   No current facility-administered medications for this visit.     Allergies  Allergen Reactions  . Codeine     Family History  Problem Relation Age of Onset  . Prostate cancer Father   . Ovarian cancer Maternal Aunt 5    deceased  . Pancreatic cancer Mother 69    deceased  . Cancer Maternal Aunt     2 other mat aunts; unk. primary in 35s; deceased  . Cancer Maternal Uncle     "bone ca"; unk. primary; deceased 34s  . Prostate cancer Maternal Uncle     deceased 46    Social History   Social History  . Marital status: Divorced    Spouse name: N/A  . Number of children: N/A  . Years of education: N/A   Occupational History  . Not on file.   Social History Main Topics  . Smoking status: Former Smoker    Packs/day: 0.10    Years: 7.00  Quit date: 12/01/1975  . Smokeless tobacco: Never Used  . Alcohol use No  . Drug use: No  . Sexual activity: Yes   Other Topics Concern  . Not on file   Social History Narrative  . No narrative on file     Constitutional: Denies fever, malaise, fatigue, headache or abrupt weight changes.  HEENT: Denies eye pain, eye redness, ear pain, ringing in the ears, wax buildup, runny nose, nasal congestion, bloody nose, or sore throat. Respiratory: Denies difficulty breathing, shortness of breath, cough or sputum production.   Cardiovascular: Denies chest pain, chest tightness, palpitations or swelling in the hands or feet.  Gastrointestinal: Denies abdominal pain, bloating, constipation, diarrhea or blood in the stool.  GU: Denies urgency, frequency, pain with urination, burning sensation, blood in urine, odor or discharge. Musculoskeletal: Denies decrease in range of  motion, difficulty with gait, muscle pain or joint pain and swelling.  Skin: Denies redness, rashes, lesions or ulcercations.  Neurological: Denies dizziness, difficulty with memory, difficulty with speech or problems with balance and coordination.  Psych: Denies anxiety, depression, SI/HI.  No other specific complaints in a complete review of systems (except as listed in HPI above).     Objective:   Physical Exam        Assessment & Plan:

## 2016-12-04 ENCOUNTER — Ambulatory Visit: Payer: Medicare PPO | Admitting: Internal Medicine

## 2016-12-07 ENCOUNTER — Encounter: Payer: Self-pay | Admitting: Internal Medicine

## 2016-12-07 ENCOUNTER — Ambulatory Visit (INDEPENDENT_AMBULATORY_CARE_PROVIDER_SITE_OTHER): Payer: Medicare PPO | Admitting: Internal Medicine

## 2016-12-07 VITALS — BP 130/70 | HR 99 | Temp 98.1°F | Wt 242.8 lb

## 2016-12-07 DIAGNOSIS — I10 Essential (primary) hypertension: Secondary | ICD-10-CM

## 2016-12-07 DIAGNOSIS — R609 Edema, unspecified: Secondary | ICD-10-CM

## 2016-12-07 DIAGNOSIS — R600 Localized edema: Secondary | ICD-10-CM

## 2016-12-07 LAB — BRAIN NATRIURETIC PEPTIDE: Pro B Natriuretic peptide (BNP): 22 pg/mL (ref 0.0–100.0)

## 2016-12-07 MED ORDER — TORSEMIDE 20 MG PO TABS: ORAL_TABLET | ORAL | 0 refills | Status: DC

## 2016-12-07 NOTE — Patient Instructions (Signed)
Edema  Edema is an abnormal buildup of fluids. It is more common in your legs and thighs. Painless swelling of the feet and ankles is more likely as a person ages. It also is common in looser skin, like around your eyes.  Follow these instructions at home:  ? Keep the affected body part above the level of the heart while lying down.  ? Do not sit still or stand for a long time.  ? Do not put anything right under your knees when you lie down.  ? Do not wear tight clothes on your upper legs.  ? Exercise your legs to help the puffiness (swelling) go down.  ? Wear elastic bandages or support stockings as told by your doctor.  ? A low-salt diet may help lessen the puffiness.  ? Only take medicine as told by your doctor.  Contact a doctor if:  ? Treatment is not working.  ? You have heart, liver, or kidney disease and notice that your skin looks puffy or shiny.  ? You have puffiness in your legs that does not get better when you raise your legs.  ? You have sudden weight gain for no reason.  Get help right away if:  ? You have shortness of breath or chest pain.  ? You cannot breathe when you lie down.  ? You have pain, redness, or warmth in the areas that are puffy.  ? You have heart, liver, or kidney disease and get edema all of a sudden.  ? You have a fever and your symptoms get worse all of a sudden.  This information is not intended to replace advice given to you by your health care provider. Make sure you discuss any questions you have with your health care provider.  Document Released: 05/04/2008 Document Revised: 04/23/2016 Document Reviewed: 09/08/2013  Elsevier Interactive Patient Education ? 2017 Elsevier Inc[employer]

## 2016-12-07 NOTE — Progress Notes (Signed)
Subjective:    Patient ID: Lindsey Marquez, female    DOB: June 29, 1951, 66 y.o.   MRN: [id number]  HPI  Pt presents to the clinic today with c/o swelling in her bilateral feet. This stated 2 weeks ago. It seems to get worse as the day goes on. The swelling is causing some pain due to the tightness. She has not noticed any redness, warmth, numbness or tingling of her lower extremities. She has a history of HTN, is on Lisinopril-HCT and Lasix. Echo from 09/2016 reviewed, grade 1 diastolic dysfunction, EF [id number]. She called her oncologist, who advised her to take 40 mg of Lasix daily, but she reports it has not really helped. She denies chest pain or shortness of breath.  Of note, her BP today is elevated at 130/70. Likely due to weight gain and swelling.  Review of Systems      Past Medical History:  Diagnosis Date  . Anxiety   . Breast cancer [facility]) August 2002   Invasive ductal carcinoma Left breast.  . Diabetes mellitus without complication ([place])   . Family history of malignant neoplasm of ovary   . Family history of pancreatic cancer   . Hypertension     Current Outpatient Prescriptions  Medication Sig Dispense Refill  . aspirin 81 MG tablet Take 81 mg by mouth daily.     Marland Kitchen dexamethasone (DECADRON) 4 MG tablet Take 1 tablet (4 mg total) by mouth 2 (two) times daily. Start the day before Taxotere. Then again the day after chemo for 3 days. 30 tablet 0  . furosemide (LASIX) 20 MG tablet Take 0.5 tablets (10 mg total) by mouth daily as needed. 90 tablet 1  . glucose blood (TRUE METRIX BLOOD GLUCOSE TEST) test strip 1 each by Other route 3 (three) times daily. Use as instructed 600 each 2  . glyBURIDE-metformin (GLUCOVANCE) 2.5-500 MG tablet Take 2 tablets by mouth 2 (two) times daily with a meal. 360 tablet 1  . ibuprofen (ADVIL,MOTRIN) 200 MG tablet Take 200 mg by mouth every 8 (eight) hours as needed.      . lidocaine-prilocaine (EMLA) cream Apply to affected area once 30 g 3  .  lisinopril-hydrochlorothiazide (PRINZIDE,ZESTORETIC) 20-25 MG tablet Take 1 tablet by mouth daily. 90 tablet 1  . Omega-3 Fatty Acids (FISH OIL) 1200 MG CAPS Take 1 capsule by mouth daily.    . ondansetron (ZOFRAN) 8 MG tablet Take 1 tablet (8 mg total) by mouth 2 (two) times daily as needed for refractory nausea / vomiting. Start on day 3 after chemo. 30 tablet 1  . Potassium Chloride ER 20 MEQ TBCR Take 20 mEq by mouth daily. 30 tablet 1  . prochlorperazine (COMPAZINE) 10 MG tablet Take 1 tablet (10 mg total) by mouth every 6 (six) hours as needed (Nausea or vomiting). 30 tablet 1  . traMADol (ULTRAM) 50 MG tablet Take 2 tablets (100 mg total) by mouth every 6 (six) hours as needed for moderate pain. 20 tablet 0  . TRUEPLUS LANCETS 33G MISC 1 each by Does not apply route 3 (three) times daily. 600 each 2   No current facility-administered medications for this visit.     Allergies  Allergen Reactions  . Codeine     Family History  Problem Relation Age of Onset  . Prostate cancer Father   . Ovarian cancer Maternal Aunt 51    deceased  . Pancreatic cancer Mother 19    deceased  . Cancer Maternal Aunt  2 other mat aunts; unk. primary in 53s; deceased  . Cancer Maternal Uncle     "bone ca"; unk. primary; deceased 19s  . Prostate cancer Maternal Uncle     deceased 32    Social History   Social History  . Marital status: Divorced    Spouse name: N/A  . Number of children: N/A  . Years of education: N/A   Occupational History  . Not on file.   Social History Main Topics  . Smoking status: Former Smoker    Packs/day: 0.10    Years: 7.00    Quit date: 12/01/1975  . Smokeless tobacco: Never Used  . Alcohol use No  . Drug use: No  . Sexual activity: Yes   Other Topics Concern  . Not on file   Social History Narrative  . No narrative on file     Constitutional: Pt reports weight gain. Denies fever, malaise, fatigue, headache.  Respiratory: Denies difficulty  breathing, shortness of breath, cough or sputum production.   Cardiovascular: Pt reports bilateral feet swelling. Denies chest pain, chest tightness, palpitations or swelling in the hands.    No other specific complaints in a complete review of systems (except as listed in HPI above).  Objective:   Physical Exam   BP 130/70   Pulse 99   Temp 98.1 F (36.7 C) (Oral)   Wt 242 lb 12 oz (110.1 kg)   SpO2 99%   BMI 35.85 kg/m  Wt Readings from Last 3 Encounters:  12/07/16 242 lb 12 oz (110.1 kg)  10/28/16 235 lb (106.6 kg)  10/26/16 234 lb 1.6 oz (106.2 kg)    General: Appears her stated age, obese in NAD. Cardiovascular: Normal rate and rhythm. 2+ pitting edema BLE. Pulmonary/Chest: Normal effort and positive vesicular breath sounds. No respiratory distress. No wheezes, rales or ronchi noted.   BMET    Component Value Date/Time   NA 142 11/26/2016 0836   K 3.5 11/26/2016 0836   CL 101 09/24/2016 0902   CL 104 12/12/2012 1031   CO2 22 11/26/2016 0836   GLUCOSE 132 11/26/2016 0836   GLUCOSE 130 (H) 12/12/2012 1031   BUN 14.7 11/26/2016 0836   CREATININE 1.0 11/26/2016 0836   CALCIUM 7.8 (L) 11/26/2016 0836   GFRNONAA >60 05/29/2016 0532   GFRAA >60 05/29/2016 0532    Lipid Panel     Component Value Date/Time   CHOL 194 09/24/2016 0902   TRIG 120.0 09/24/2016 0902   HDL 79.10 09/24/2016 0902   CHOLHDL 2 09/24/2016 0902   VLDL 24.0 09/24/2016 0902   LDLCALC 91 09/24/2016 0902    CBC    Component Value Date/Time   WBC 4.3 11/26/2016 0835   WBC 9.6 09/24/2016 0902   RBC 2.40 (L) 11/26/2016 0835   RBC 2.43 (L) 09/24/2016 0902   HGB 8.3 (L) 11/26/2016 0835   HCT 24.3 (L) 11/26/2016 0835   PLT 81 (L) 11/26/2016 0835   MCV 101.5 (H) 11/26/2016 0835   MCH 34.8 (H) 11/26/2016 0835   MCH 32.0 05/29/2016 0532   MCHC 34.3 11/26/2016 0835   MCHC 33.9 09/24/2016 0902   RDW 25.0 (H) 11/26/2016 0835   LYMPHSABS 1.7 11/26/2016 0835   MONOABS 0.3 11/26/2016 0835    EOSABS 0.1 11/26/2016 0835   BASOSABS 0.0 11/26/2016 0835    Hgb A1C Lab Results  Component Value Date   HGBA1C 8.0 (H) 09/24/2016           Assessment & Plan:  Peripheral edema:  Discussed the importance of low salt diet and keeping her feet elevated Will check BNP today RX for TED hose, on during the day, off at night D/C Lasix eRx for Torsedmide 20 mg daily x 3 days, if continues to have swelling, increase to BID Check kidney function in 2 weeks  HTN:  Should improve with getting the fluid off Will try Torsemide If no improvement, will increase Lisinopril HCT.  RTC as needed or if symptoms persist or worsen BAITY, REGINA, NP

## 2016-12-14 NOTE — Progress Notes (Signed)
[place]  [phone] [phone]  Clinic Follow Up Note   Patient Care Team: [name], NP as PCP - General (Internal Medicine) [name], MD as Consulting Physician (General Surgery) 12/15/2016   CHIEF COMPLAINTS:  Follow up recurrent left breast cancer   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 [facility])   Staging form: Breast, AJCC 7th Edition     Clinical stage from 04/17/2016: Stage IA (T1c, N0, M0) - Signed by [name], MD on 05/07/2016     Pathologic stage from 05/28/2016: Stage IIA (T2, N0, cM0) - Signed by [name], MD on 06/11/2016 History of left breast cancer   Staging form: Breast, AJCC 7th Edition     Clinical: Stage IIIB (T4d, N1, cM0) - Signed by [name], MD on 12/14/2013     Pathologic: Stage IIIB (T4d, N1a, cM0) - Signed by [name], MD on 12/14/2013       History of left breast cancer   09/12/2002 Imaging    CT scan show no evidence of disease apart from the left axilla      09/2002 -  Neo-Adjuvant Chemotherapy    TAC X4 cycles       01/10/2003 Surgery    The patient had left lumpectomy and axillary lymph node dissection which show residual breast cancer and a sentinel lymph node was positive      2004 -  Adjuvant Chemotherapy    Cytoxan with 5-FU x4 cycles (methotrexate added at cycle 4).      2004 -  Radiation Therapy    Adjuvant left breast radiation      12/2002 Receptors her2    ER, PR and HER-2 were all negative.       Cancer of central portion of left breast ([place])   04/13/2016 Mammogram    Scheduler coarse heterogeneous calcifications spanning an area of 3.7 cm in the lumpectomy site, indeterminate. Ultrasound of the left axilla was negative.      04/17/2016 Initial Diagnosis    Cancer of central portion of left breast ([place])      04/17/2016 Initial Biopsy    Left breast core needle biopsy showed invasive and in situ ductal carcinoma with calcification, grade 2.      04/17/2016 Receptors her2    Your 100% positive, PR 15% positive, strong staining, Ki-67 10%, HER-2 positive by IHC (3)      05/05/2016 Imaging    Bilateral breast MRI showed a 1.3 x 0.8 x 0.7 cm biopsy-proven ductal carcinoma in the region of the previous lumpectomy. Enlarged lobulated right inferior axillary lymph node with diffuse cortical thickening, biopsy is recommended.      05/15/2016 Pathology Results    right axilary node biopsy was negative for malignant cell       05/28/2016 Surgery    Simple left mastectomy      05/28/2016 Pathology Results    Left mastectomy showed invasive ductal carcinoma, grade 3, 3.6 cm, high grade DCIS, (+) LVI, surgical margins were negative. Tumor 3.6 cm, no lymph nodes identified.      06/25/2016 Imaging    Staging CT scan of the chest, abdomen and pelvis with contrast and a bone scan showed no evidence of metastasis. Postoperative fluid collection in the left breast, likely a seroma or hematoma. Right axillary adenopathy, which was biopsied previously.       07/07/2016 -  Chemotherapy    Adjuvant chemotherapy with docetaxel, carboplatin, Herceptin and pejeta  every 3 weeks, for 6 cycles, followed by Herceptin maintenance therapy for total of one year treatment         HISTORY OF PRESENTING ILLNESS:  Lindsey Marquez 66 y.o. female is here because of her recurrent left breast cancer.  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 She was seen by multiple medical oncologist in our practice in the past, last was seen by Dr. Alvy Bimler in 11/2013.  She has been doing well, and is compliant with annual screening mammogram. The screening mammogram on 04/17/2016 showed calcification in the left breast, and core needle biopsy showed invasive and in situ ductal carcinoma, grade 2, ER/PR positive, HER-2 amplified. She was referred to  seeing breast surgeon Dr. Georgette Dover and referred back to [place].   She feels well, she did not feel any lump in her breast or axillas latley. She denies any pain, dyspnea, or GI symptoms. She had right nipple rash and right axillary swelling in the past one week, after she drinks some diet tea with citric. Breast MRI showed an enlarged lobulated right inferior axillary lymph node with diffuse cortical thickening, in addition to the known left breast mass which measured 1.3 cm on MRI.  GYN HISTORY  Menarchal: 12 LMP: 2003 Contraceptive: 20 HRT: n/a  G2P2: 57 yo Son and 18 yo son who died   CURRENT THERAPY: maintenance Herceptin every 3 weeks, she will start letrozole this week   INTERIM HISTORY: Ms Gilmer returns for follow up and 2nd cycle herceptin. She is doing well. She went to her physician and received medication for her feet swelling. This is Lasix which she is taking once daily. She continues to feel tired. She felt better after her blood transfusion. She is still able to do the daily activities she needs to do. She denies shortness of breath. She reports diarrhea. She has used imodium which helps. She has taken Tamoxifen previously. She had hot flashes with tamoxifen though "they were not that bad". When her extremities become cold, they ache.   MEDICAL HISTORY:  Past Medical History:  Diagnosis Date  . Anxiety   . Breast cancer [facility]) August 2002   Invasive ductal carcinoma Left breast.  . Diabetes mellitus without complication ([place])   . Family history of malignant neoplasm of ovary   . Family history of pancreatic cancer   . Hypertension     SURGICAL HISTORY: Past Surgical History:  Procedure Laterality Date  . BREAST SURGERY Left 2003  . FOOT SURGERY  2000  . MASTECTOMY Left 05/28/2016  . port a cath insertion  05/28/2016  . PORTACATH PLACEMENT Right 05/28/2016   Procedure: INSERTION PORT-A-CATH;  Surgeon: [name], MD;  Location: [place];  Service: General;  Laterality: Right;    . TOTAL MASTECTOMY Left 05/28/2016   Procedure: LEFT  MASTECTOMY;  Surgeon: [name], MD;  Location: [place];  Service: General;  Laterality: Left;  . TUBAL LIGATION  22 yrs since  2004.   Bilateral.    SOCIAL HISTORY: Social History   Social History  . Marital status: Divorced    Spouse name: N/A  . Number of children: N/A  . Years of education: N/A   Occupational History  . Not on file.   Social History Main Topics  . Smoking status: Former Smoker    Packs/day: 0.10    Years: 7.00    Quit date: 12/01/1975  .  Smokeless tobacco: Never Used  . Alcohol use No  . Drug use: No  . Sexual activity: Yes   Other Topics Concern  . Not on file   Social History Narrative  . No narrative on file   She is retired from school   FAMILY HISTORY: Family History  Problem Relation Age of Onset  . Prostate cancer Father   . Ovarian cancer Maternal Aunt 72    deceased  . Pancreatic cancer Mother 72    deceased  . Cancer Maternal Aunt     2 other mat aunts; unk. primary in 54s; deceased  . Cancer Maternal Uncle     "bone ca"; unk. primary; deceased 52s  . Prostate cancer Maternal Uncle     deceased 69    ALLERGIES:  is allergic to codeine.  MEDICATIONS:  Current Outpatient Prescriptions  Medication Sig Dispense Refill  . dexamethasone (DECADRON) 4 MG tablet Take 1 tablet (4 mg total) by mouth 2 (two) times daily. Start the day before Taxotere. Then again the day after chemo for 3 days. 30 tablet 0  . glucose blood (TRUE METRIX BLOOD GLUCOSE TEST) test strip 1 each by Other route 3 (three) times daily. Use as instructed 600 each 2  . glyBURIDE-metformin (GLUCOVANCE) 2.5-500 MG tablet Take 2 tablets by mouth 2 (two) times daily with a meal. 360 tablet 1  . ibuprofen (ADVIL,MOTRIN) 200 MG tablet Take 200 mg by mouth every 8 (eight) hours as needed.      . lidocaine-prilocaine (EMLA) cream Apply to affected area once 30 g 3  . lisinopril-hydrochlorothiazide (PRINZIDE,ZESTORETIC)  20-25 MG tablet Take 1 tablet by mouth daily. 90 tablet 1  . Omega-3 Fatty Acids (FISH OIL) 1200 MG CAPS Take 1 capsule by mouth daily.    . ondansetron (ZOFRAN) 8 MG tablet Take 1 tablet (8 mg total) by mouth 2 (two) times daily as needed for refractory nausea / vomiting. Start on day 3 after chemo. 30 tablet 1  . Potassium Chloride ER 20 MEQ TBCR Take 20 mEq by mouth daily. 30 tablet 1  . prochlorperazine (COMPAZINE) 10 MG tablet Take 1 tablet (10 mg total) by mouth every 6 (six) hours as needed (Nausea or vomiting). 30 tablet 1  . torsemide (DEMADEX) 20 MG tablet Take 1 tablet daily 3 days, if no improvement, increase to 20 mg BID 60 tablet 0  . traMADol (ULTRAM) 50 MG tablet Take 2 tablets (100 mg total) by mouth every 6 (six) hours as needed for moderate pain. 20 tablet 0  . TRUEPLUS LANCETS 33G MISC 1 each by Does not apply route 3 (three) times daily. 600 each 2  . aspirin 81 MG tablet Take 81 mg by mouth daily.     Marland Kitchen letrozole (FEMARA) 2.5 MG tablet Take 1 tablet (2.5 mg total) by mouth daily. 30 tablet 3   No current facility-administered medications for this visit.    Facility-Administered Medications Ordered in Other Visits  Medication Dose Route Frequency Provider Last Rate Last Dose  . heparin lock flush 100 unit/mL  500 Units Intracatheter Once PRN [name], MD      . sodium chloride flush (NS) 0.9 % injection 10 mL  10 mL Intracatheter PRN [name], MD      . trastuzumab (HERCEPTIN) 651 mg in sodium chloride 0.9 % 250 mL chemo infusion  6 mg/kg (Order-Specific) Intravenous Once [name], MD 562 mL/hr at 12/15/16 1019 651 mg at 12/15/16 1019    REVIEW OF SYSTEMS:  Constitutional: Denies fevers, chills or abnormal night sweats (+) fatigue Eyes: Denies blurriness of vision, double vision or watery eyes Ears, nose, mouth, throat, and face: Denies mucositis or sore throat Respiratory: Denies cough, dyspnea or wheezes Cardiovascular: Denies palpitation, chest discomfort (+) lower  extremity swelling managed with Lasix Gastrointestinal:  Denies nausea, heartburn or change in bowel habits Skin: Denies abnormal skin rashes Lymphatics: Denies new lymphadenopathy or easy bruising Neurological:Denies numbness, tingling or new weaknesses Behavioral/Psych: Mood is stable, no new changes  All other systems were reviewed with the patient and are negative.  PHYSICAL EXAMINATION: ECOG PERFORMANCE STATUS: 1  Vitals:   12/15/16 0923  BP: (!) 124/53  Pulse: (!) 108  Resp: 17  Temp: 98.4 F (36.9 C)   Filed Weights   12/15/16 0923  Weight: 234 lb (106.1 kg)    GENERAL:alert, no distress and comfortable SKIN: skin color, texture, turgor are normal, no rashes or significant lesions EYES: normal, conjunctiva are pink and non-injected, sclera clear OROPHARYNX:no exudate, no erythema and lips, buccal mucosa, and tongue normal  NECK: supple, thyroid normal size, non-tender, without nodularity LYMPH:  no palpable lymphadenopathy in the cervical, axillary or inguinal LUNGS: clear to auscultation and percussion with normal breathing effort HEART: regular rate & rhythm and no murmurs and no lower extremity edema ABDOMEN:abdomen soft, non-tender and normal bowel sounds Musculoskeletal:no cyanosis of digits and no clubbing  PSYCH: alert & oriented x 3 with fluent speech NEURO: no focal motor/sensory deficits, (+) decreased vibration sensation on both hands, no weakness. Breasts: Breast inspection showed left breast is surgically absent, surgical incision has healed well. Exam of the right breast and bilateral axillas reviewed no palpable mass or adenopathy.   LABORATORY DATA:  I have reviewed the data as listed CBC Latest Ref Rng & Units 12/15/2016 11/26/2016 11/24/2016  WBC 3.9 - 10.3 10e3/uL 7.0 4.3 5.0  Hemoglobin 11.6 - 15.9 g/dL 8.6(L) 8.3(L) 6.8(LL)  Hematocrit 34.8 - 46.6 % 25.7(L) 24.3(L) 19.6(L)  Platelets 145 - 400 10e3/uL 287 81(L) 77(L)   CMP Latest Ref Rng &  Units 12/15/2016 11/26/2016 11/24/2016  Glucose 70 - 140 mg/dl 84 132 121  BUN 7.0 - 26.0 mg/dL 22.8 14.7 15.8  Creatinine 0.6 - 1.1 mg/dL 1.1 1.0 1.0  Sodium 136 - 145 mEq/L 142 142 142  Potassium 3.5 - 5.1 mEq/L 3.2(L) 3.5 3.2(L)  Chloride 96 - 112 mEq/L - - -  CO2 22 - 29 mEq/L 22 22 21(L)  Calcium 8.4 - 10.4 mg/dL 8.3(L) 7.8(L) 8.0(L)  Total Protein 6.4 - 8.3 g/dL 7.8 7.3 7.3  Total Bilirubin 0.20 - 1.20 mg/dL 0.43 0.32 0.36  Alkaline Phos 40 - 150 U/L 62 49 46  AST 5 - 34 U/L _0 ALT 0 - 55 U/L _1 PATHOLOGY REPORT  Diagnosis 04/17/2016 Breast, left, needle core biopsy, inner mid breast - INVASIVE DUCTAL CARCINOMA WITH CALCIFICATIONS, SEE COMMENT. - DUCTAL CARCINOMA IN SITU WITH COMEDONECROSIS. Microscopic Comment While grading is best performed on the resection specimen the invasive carcinoma appears grade 2. Prognostic markers will be ordered and reported in an addendum. Dr. Lyndon Code has reviewed the case. The case was called to [facility] on 04/20/2016.  Results: HER2 - *EQUIVOCAL* (Her2 by IHC will be ordered.) RATIO OF HER2/CEP17 SIGNALS 1.73 AVERAGE HER2 COPY NUMBER PER CELL 4.15 By immunohistochemistry, the tumor cells are positive for Her2 (3).  Results: IMMUNOHISTOCHEMICAL AND MORPHOMETRIC ANALYSIS PERFORMED MANUALLY Estrogen Receptor: 100%,  POSITIVE, STRONG STAINING INTENSITY Progesterone Receptor: 15%, POSITIVE, STRONG STAINING INTENSITY Proliferation Marker Ki67: 10%  Diagnosis 05/15/2016 Lymph node, needle/core biopsy, right axilla - BENIGN REACTIVE LYMPH NODE. - NO GRANULOMAS OR MALIGNANCY IDENTIFIED. - SEE COMMENT. Microscopic Comment Internal departmental review obtained (Dr. Lyndon Code) with agreement. Results are phoned to [facility] (05/18/16). (MEG:gt, 05/18/16)   Breast, simple mastectomy, Left 05/28/2016 - INVASIVE DUCTAL CARCINOMA, GRADE III/III, SPANNING 3.6 CM. - DUCTAL CARCINOMA IN SITU,  HIGH GRADE. - LYMPHOVASCULAR INVASION IS IDENTIFIED. - THE SURGICAL RESECTION MARGINS ARE NEGATIVE FOR CARCINOMA. - SEE ONCOLOGY TABLE BELOW. Microscopic Comment BREAST, INVASIVE TUMOR, WITHOUT LYMPH NODES PRESENT Specimen, including laterality : Left breast Procedure: Simple mastectomy Histologic type: Ductal with micropapillary features Grade: III Tubule formation: 3 Nuclear pleomorphism: 2 Mitotic: 3 Tumor size (gross measurement): 3.6 cm Margins: Greater than 0.2 cm to all margins Lymphovascular invasion: Present Ductal carcinoma in situ: Present Grade: High grade Extensive intraductal component: Yes Lobular neoplasia: Not identified Tumor focality: Unifocal Treatment effect: N/A Extent of tumor: Confined to breast parenchyma. Breast prognostic profile: [phone] Estrogen receptor: 100%, strong staining intensity Progesterone receptor: 15%, strong staining intensity Her 2 neu: Amplification was detected by immunohistochemistry. Ki-67: 10% 1 of 2 FINAL for MAILY, DEBARGE ([id number]) Microscopic Comment(continued) Non-neoplastic breast: No significant findings. TNM: pT2, pNX (clinically recurrent). Comments: In addition to the main tumor, there is ductal carcinoma in situ present in random tissue submitted from the inferior lateral quadrant. (JBK:kh 06-01-16)  RADIOGRAPHIC STUDIES: I have personally reviewed the radiological images as listed and agreed with the findings in the report. No results found.   DG Chest 2 View 10/28/2016 IMPRESSION: No acute cardiopulmonary abnormality.  ASSESSMENT & PLAN:  66 year old African-American female, postmenopausal, history of left breast triple negative cancer in 2003, presented with mammogram discovered left breast cancer, triple positive.  1. Cancer of central portion of left breast, pT2NxM0, stage IIA, G3, triple positive -I reviewed her surgical pathology findings in great details with patient and her family member.  It showed a 3.6 cm mass, grade 3, surgical margins were negative. She had no sentinel lymph node biopsy due to her prior history of axillary lymph node dissection. -Her restaging CT scan of bone scan showed no evidence of distant metastasis, I reviewed with patient. -We discussed the risk of cancer recurrence of this complete surgical resection. We reviewed that HER-2 positive with cancers are more aggressive, with increased risk of metastasis.  -Given the moderate to high risk of cancer recurrence, I recommend adjuvant chemotherapy to reduce her risk of cancer recurrence. I recommend chemotherapy and duo anti-HER-2 agents TCHP (docetaxel, carbotaxol, Herceptin, pejeta), every 3 weeks for total of 6 cycles, followed by maintenance Herceptin every 3 weeks to complete 1 year treatment. -She has been tolerating chemo well overall so far  -She has developed peripheral neuropathy, secondary to chemotherapy, we'll monitor closely, stable overall -Lab results reviewed with her, she has worsening anemia, hemoglobin 8.6 today, mild some cytopenia, adequate for treatment, we'll proceed with 2nd cycle Herceptin maintenance. -Due to her ER positive disease, I recommend her to start adjuvant endocrine therapy with aromatase inhibitor to decrease her risk of recurrence . We discussed the risks and benefits of these medications. She has previously taken tamoxifen without any major issues. I have prescribed letrozole today, she will start this week. -I have also ordered a bone scan to be performed in the next month. She is due for a repeat mammogram in May 2018.  -  She will be due for repeat echocardiogram in late February / early March 2018  2. Anemia  -Secondary to chemotherapy, her hemoglobin is 8.6, she has mild symptoms from anemia -She felt better after last blood transfusion.   3. DM and HTN  -She'll continue follow-up with her primary care physician -We discussed her blood pressure and glucose level may be  impacted by chemotherapy, and dexamethasone she takes before and after chemotherapy. -I strongly encouraged her to monitor her blood pressure and glucose at home, and consider increasing the dose of metformin and glyburide if needed -her BP and blood glucose has been normal/well controlled lately   4. Obesity  -I encouraged her to eat healthy and exercise regularly, she is willing to lose some weight after she completes chemotherapy.  5. 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 She does not feel she needs medication for now.  6. Mild diarrhea -Possibly residual from her previous chemotherapy -She knows to use Imodium as needed for her diarrhea.  Plan -2nd cycle of maintenance  Herceptin today and continue every 3 weeks -I have prescribed letrozole today, she will start this week. I have also ordered a bone scan in the next month.  -RTC in 6 weeks for follow-up -At her next visit, we will discuss scheduling repeat echocardiogram in late February / early March if it has not already been scheduled.  All questions were answered. The patient knows to call the clinic with any problems, questions or concerns.  I spent 20 minutes counseling the patient face to face. The total time spent in the appointment was 25 minutes and more than 50% was on counseling.  This document serves as a record of services personally performed by [name], MD. It was created on her behalf by Martinique Casey, a trained medical scribe. The creation of this record is based on the scribe's personal observations and the provider's statements to them. This document has been checked and approved by the attending provider.    [name], MD 12/15/2016

## 2016-12-15 ENCOUNTER — Ambulatory Visit (HOSPITAL_BASED_OUTPATIENT_CLINIC_OR_DEPARTMENT_OTHER): Payer: Medicare PPO | Admitting: Hematology

## 2016-12-15 ENCOUNTER — Ambulatory Visit (HOSPITAL_BASED_OUTPATIENT_CLINIC_OR_DEPARTMENT_OTHER): Payer: Medicare PPO

## 2016-12-15 ENCOUNTER — Other Ambulatory Visit (HOSPITAL_BASED_OUTPATIENT_CLINIC_OR_DEPARTMENT_OTHER): Payer: Medicare PPO

## 2016-12-15 ENCOUNTER — Other Ambulatory Visit: Payer: Self-pay | Admitting: *Deleted

## 2016-12-15 ENCOUNTER — Encounter: Payer: Self-pay | Admitting: Hematology

## 2016-12-15 ENCOUNTER — Telehealth: Payer: Self-pay | Admitting: *Deleted

## 2016-12-15 ENCOUNTER — Ambulatory Visit: Payer: Medicare PPO

## 2016-12-15 VITALS — BP 124/53 | HR 108 | Temp 98.4°F | Resp 17 | Ht 69.0 in | Wt 234.0 lb

## 2016-12-15 VITALS — HR 98

## 2016-12-15 DIAGNOSIS — D6481 Anemia due to antineoplastic chemotherapy: Secondary | ICD-10-CM

## 2016-12-15 DIAGNOSIS — Z5112 Encounter for antineoplastic immunotherapy: Secondary | ICD-10-CM | POA: Diagnosis not present

## 2016-12-15 DIAGNOSIS — E119 Type 2 diabetes mellitus without complications: Secondary | ICD-10-CM

## 2016-12-15 DIAGNOSIS — Z853 Personal history of malignant neoplasm of breast: Secondary | ICD-10-CM

## 2016-12-15 DIAGNOSIS — R197 Diarrhea, unspecified: Secondary | ICD-10-CM

## 2016-12-15 DIAGNOSIS — G620 Drug-induced polyneuropathy: Secondary | ICD-10-CM | POA: Diagnosis not present

## 2016-12-15 DIAGNOSIS — E669 Obesity, unspecified: Secondary | ICD-10-CM

## 2016-12-15 DIAGNOSIS — T451X5A Adverse effect of antineoplastic and immunosuppressive drugs, initial encounter: Secondary | ICD-10-CM

## 2016-12-15 DIAGNOSIS — C50112 Malignant neoplasm of central portion of left female breast: Secondary | ICD-10-CM | POA: Diagnosis not present

## 2016-12-15 DIAGNOSIS — I10 Essential (primary) hypertension: Secondary | ICD-10-CM

## 2016-12-15 LAB — COMPREHENSIVE METABOLIC PANEL
ALT: 12 U/L (ref 0–55)
AST: 20 U/L (ref 5–34)
Albumin: 3.4 g/dL — ABNORMAL LOW (ref 3.5–5.0)
Alkaline Phosphatase: 62 U/L (ref 40–150)
Anion Gap: 12 mEq/L — ABNORMAL HIGH (ref 3–11)
BUN: 22.8 mg/dL (ref 7.0–26.0)
CO2: 22 mEq/L (ref 22–29)
Calcium: 8.3 mg/dL — ABNORMAL LOW (ref 8.4–10.4)
Chloride: 109 mEq/L (ref 98–109)
Creatinine: 1.1 mg/dL (ref 0.6–1.1)
EGFR: 59 mL/min/{1.73_m2} — ABNORMAL LOW (ref >90)
Glucose: 84 mg/dl (ref 70–140)
Potassium: 3.2 mEq/L — ABNORMAL LOW (ref 3.5–5.1)
Sodium: 142 mEq/L (ref 136–145)
Total Bilirubin: 0.43 mg/dL (ref 0.20–1.20)
Total Protein: 7.8 g/dL (ref 6.4–8.3)

## 2016-12-15 LAB — CBC WITH DIFFERENTIAL/PLATELET
BASO%: 0.1 % (ref 0.0–2.0)
Basophils Absolute: 0 10*3/uL (ref 0.0–0.1)
EOS%: 0.9 % (ref 0.0–7.0)
Eosinophils Absolute: 0.1 10*3/uL (ref 0.0–0.5)
HCT: 25.7 % — ABNORMAL LOW (ref 34.8–46.6)
HGB: 8.6 g/dL — ABNORMAL LOW (ref 11.6–15.9)
LYMPH%: 28.5 % (ref 14.0–49.7)
MCH: 33.3 pg (ref 25.1–34.0)
MCHC: 33.5 g/dL (ref 31.5–36.0)
MCV: 99.6 fL (ref 79.5–101.0)
MONO#: 0.7 10*3/uL (ref 0.1–0.9)
MONO%: 10.1 % (ref 0.0–14.0)
NEUT#: 4.2 10*3/uL (ref 1.5–6.5)
NEUT%: 60.4 % (ref 38.4–76.8)
Platelets: 287 10*3/uL (ref 145–400)
RBC: 2.58 10*6/uL — ABNORMAL LOW (ref 3.70–5.45)
RDW: 22.6 % — ABNORMAL HIGH (ref 11.2–14.5)
WBC: 7 10*3/uL (ref 3.9–10.3)
lymph#: 2 10*3/uL (ref 0.9–3.3)

## 2016-12-15 MED ORDER — DIPHENHYDRAMINE HCL 25 MG PO CAPS
50.0000 mg | ORAL_CAPSULE | Freq: Once | ORAL | Status: AC
  Administered 2016-12-15: 50 mg via ORAL

## 2016-12-15 MED ORDER — LETROZOLE 2.5 MG PO TABS: 2.5000 mg | ORAL_TABLET | Freq: Every day | ORAL | 3 refills | Status: DC

## 2016-12-15 MED ORDER — TRASTUZUMAB CHEMO 150 MG IV SOLR
6.0000 mg/kg | Freq: Once | INTRAVENOUS | Status: AC
  Administered 2016-12-15: 651 mg via INTRAVENOUS
  Filled 2016-12-15: qty 31

## 2016-12-15 MED ORDER — ACETAMINOPHEN 325 MG PO TABS
650.0000 mg | ORAL_TABLET | Freq: Once | ORAL | Status: AC
  Administered 2016-12-15: 650 mg via ORAL

## 2016-12-15 MED ORDER — HEPARIN SOD (PORK) LOCK FLUSH 100 UNIT/ML IV SOLN
500.0000 [IU] | Freq: Once | INTRAVENOUS | Status: AC | PRN
  Administered 2016-12-15: 500 [IU]
  Filled 2016-12-15: qty 5

## 2016-12-15 MED ORDER — POTASSIUM CHLORIDE ER 20 MEQ PO TBCR: 40.0000 meq | EXTENDED_RELEASE_TABLET | Freq: Every day | ORAL | 1 refills | Status: DC

## 2016-12-15 MED ORDER — SODIUM CHLORIDE 0.9% FLUSH
10.0000 mL | INTRAVENOUS | Status: DC | PRN
  Administered 2016-12-15: 10 mL
  Filled 2016-12-15: qty 10

## 2016-12-15 MED ORDER — SODIUM CHLORIDE 0.9 % IV SOLN
Freq: Once | INTRAVENOUS | Status: AC
  Administered 2016-12-15: 10:00:00 via INTRAVENOUS

## 2016-12-15 MED ORDER — ACETAMINOPHEN 325 MG PO TABS
ORAL_TABLET | ORAL | Status: AC
  Filled 2016-12-15: qty 2

## 2016-12-15 MED ORDER — DIPHENHYDRAMINE HCL 25 MG PO CAPS
ORAL_CAPSULE | ORAL | Status: AC
  Filled 2016-12-15: qty 2

## 2016-12-15 NOTE — Telephone Encounter (Signed)
Left message for pt to return call regarding increasing K+ to bid due to K+ low.

## 2016-12-15 NOTE — Patient Instructions (Signed)
[facility] Discharge Instructions for Patients Receiving Chemotherapy  Today you received the following chemotherapy agents: Herceptin  To help prevent nausea and vomiting after your treatment, we encourage you to take your nausea medication as directed.   If you develop nausea and vomiting that is not controlled by your nausea medication, call the clinic.   BELOW ARE SYMPTOMS THAT SHOULD BE REPORTED IMMEDIATELY:  *FEVER GREATER THAN 100.5 F  *CHILLS WITH OR WITHOUT FEVER  NAUSEA AND VOMITING THAT IS NOT CONTROLLED WITH YOUR NAUSEA MEDICATION  *UNUSUAL SHORTNESS OF BREATH  *UNUSUAL BRUISING OR BLEEDING  TENDERNESS IN MOUTH AND THROAT WITH OR WITHOUT PRESENCE OF ULCERS  *URINARY PROBLEMS  *BOWEL PROBLEMS  UNUSUAL RASH Items with * indicate a potential emergency and should be followed up as soon as possible.  Feel free to call the clinic you have any questions or concerns. The clinic phone number is [phone].  Please show the [facility] at check-in to the Emergency Department and triage nurse.    Blood Transfusion , Adult A blood transfusion is a procedure in which you receive donated blood, including plasma, platelets, and red blood cells, through an IV tube. You may need a blood transfusion because of illness, surgery, or injury. The blood may come from a donor. You may also be able to donate blood for yourself (autologous blood donation) before a surgery if you know that you might require a blood transfusion. The blood given in a transfusion is made up of different types of cells. You may receive:  Red blood cells. These carry oxygen to the cells in the body.  White blood cells. These help you fight infections.  Platelets. These help your blood to clot.  Plasma. This is the liquid part of your blood and it helps with fluid imbalances. If you have hemophilia or another clotting disorder, you may also receive other types of blood  products. Tell a health care provider about:  Any allergies you have.  All medicines you are taking, including vitamins, herbs, eye drops, creams, and over-the-counter medicines.  Any problems you or family members have had with anesthetic medicines.  Any blood disorders you have.  Any surgeries you have had.  Any medical conditions you have, including any recent fever or cold symptoms.  Whether you are pregnant or may be pregnant.  Any previous reactions you have had during a blood transfusion. What are the risks? Generally, this is a safe procedure. However, problems may occur, including:  Having an allergic reaction to something in the donated blood. Hives and itching may be symptoms of this type of reaction.  Fever. This may be a reaction to the white blood cells in the transfused blood. Nausea or chest pain may accompany a fever.  Iron overload. This can happen from having many transfusions.  Transfusion-related acute lung injury (TRALI). This is a rare reaction that causes lung damage. The cause is not known.TRALI can occur within hours of a transfusion or several days later.  Sudden (acute) or delayed hemolytic reactions. This happens if your blood does not match the cells in your transfusion. Your body's defense system (immune system) may try to attack the new cells. This complication is rare. The symptoms include fever, chills, nausea, and low back pain or chest pain.  Infection or disease transmission. This is rare. What happens before the procedure?  You will have a blood test to determine your blood type. This is necessary to know what kind of blood  your body will accept and to match it to the donor blood.  If you are going to have a planned surgery, you may be able to do an autologous blood donation. This may be done in case you need to have a transfusion.  If you have had an allergic reaction to a transfusion in the past, you may be given medicine to help prevent  a reaction. This medicine may be given to you by mouth or through an IV tube.  You will have your temperature, blood pressure, and pulse monitored before the transfusion.  Follow instructions from your health care provider about eating and drinking restrictions.  Ask your health care provider about:  Changing or stopping your regular medicines. This is especially important if you are taking diabetes medicines or blood thinners.  Taking medicines such as aspirin and ibuprofen. These medicines can thin your blood. Do not take these medicines before your procedure if your health care provider instructs you not to. What happens during the procedure?  An IV tube will be inserted into one of your veins.  The bag of donated blood will be attached to your IV tube. The blood will then enter through your vein.  Your temperature, blood pressure, and pulse will be monitored regularly during the transfusion. This monitoring is done to detect early signs of a transfusion reaction.  If you have any signs or symptoms of a reaction, your transfusion will be stopped and you may be given medicine.  When the transfusion is complete, your IV tube will be removed.  Pressure may be applied to the IV site for a few minutes.  A bandage (dressing) will be applied. The procedure may vary among health care providers and hospitals. What happens after the procedure?  Your temperature, blood pressure, heart rate, breathing rate, and blood oxygen level will be monitored often.  Your blood may be tested to see how you are responding to the transfusion.  You may be warmed with fluids or blankets to maintain a normal body temperature. Summary  A blood transfusion is a procedure in which you receive donated blood, including plasma, platelets, and red blood cells, through an IV tube.  Your temperature, blood pressure, and pulse will be monitored before, during, and after the transfusion.  Your blood may be tested  after the transfusion to see how your body has responded. This information is not intended to replace advice given to you by your health care provider. Make sure you discuss any questions you have with your health care provider. Document Released: 11/13/2000 Document Revised: 08/13/2016 Document Reviewed: 08/13/2016 Elsevier Interactive Patient Education  2017 [employer].

## 2016-12-20 ENCOUNTER — Telehealth: Payer: Self-pay | Admitting: Hematology

## 2016-12-20 NOTE — Telephone Encounter (Signed)
Lvm advising appts 2/6 @ 11.15 and 2/27 @ 1pm

## 2016-12-28 ENCOUNTER — Other Ambulatory Visit (INDEPENDENT_AMBULATORY_CARE_PROVIDER_SITE_OTHER): Payer: Medicare PPO

## 2016-12-28 DIAGNOSIS — E119 Type 2 diabetes mellitus without complications: Secondary | ICD-10-CM | POA: Diagnosis not present

## 2016-12-28 LAB — HEMOGLOBIN A1C: Hgb A1c MFr Bld: 6.9 % — ABNORMAL HIGH (ref 4.6–6.5)

## 2017-01-04 ENCOUNTER — Other Ambulatory Visit: Payer: Self-pay | Admitting: Internal Medicine

## 2017-01-04 DIAGNOSIS — R609 Edema, unspecified: Secondary | ICD-10-CM

## 2017-01-05 ENCOUNTER — Ambulatory Visit (HOSPITAL_BASED_OUTPATIENT_CLINIC_OR_DEPARTMENT_OTHER): Payer: Medicare PPO

## 2017-01-05 ENCOUNTER — Other Ambulatory Visit: Payer: Self-pay | Admitting: Hematology

## 2017-01-05 ENCOUNTER — Other Ambulatory Visit (HOSPITAL_BASED_OUTPATIENT_CLINIC_OR_DEPARTMENT_OTHER): Payer: Medicare PPO

## 2017-01-05 ENCOUNTER — Telehealth: Payer: Self-pay | Admitting: Hematology

## 2017-01-05 VITALS — BP 121/56 | HR 100 | Temp 98.8°F | Resp 16

## 2017-01-05 DIAGNOSIS — C50112 Malignant neoplasm of central portion of left female breast: Secondary | ICD-10-CM

## 2017-01-05 DIAGNOSIS — Z5112 Encounter for antineoplastic immunotherapy: Secondary | ICD-10-CM

## 2017-01-05 LAB — COMPREHENSIVE METABOLIC PANEL
ALT: 13 U/L (ref 0–55)
AST: 18 U/L (ref 5–34)
Albumin: 3.8 g/dL (ref 3.5–5.0)
Alkaline Phosphatase: 60 U/L (ref 40–150)
Anion Gap: 13 mEq/L — ABNORMAL HIGH (ref 3–11)
BUN: 28.4 mg/dL — ABNORMAL HIGH (ref 7.0–26.0)
CO2: 25 mEq/L (ref 22–29)
Calcium: 9.5 mg/dL (ref 8.4–10.4)
Chloride: 103 mEq/L (ref 98–109)
Creatinine: 1.5 mg/dL — ABNORMAL HIGH (ref 0.6–1.1)
EGFR: 42 mL/min/{1.73_m2} — ABNORMAL LOW (ref >90)
Glucose: 163 mg/dl — ABNORMAL HIGH (ref 70–140)
Potassium: 4 mEq/L (ref 3.5–5.1)
Sodium: 142 mEq/L (ref 136–145)
Total Bilirubin: 0.33 mg/dL (ref 0.20–1.20)
Total Protein: 8.1 g/dL (ref 6.4–8.3)

## 2017-01-05 LAB — CBC WITH DIFFERENTIAL/PLATELET
BASO%: 0.3 % (ref 0.0–2.0)
Basophils Absolute: 0 10*3/uL (ref 0.0–0.1)
EOS%: 2.2 % (ref 0.0–7.0)
Eosinophils Absolute: 0.2 10*3/uL (ref 0.0–0.5)
HCT: 27.7 % — ABNORMAL LOW (ref 34.8–46.6)
HGB: 9.3 g/dL — ABNORMAL LOW (ref 11.6–15.9)
LYMPH%: 23.3 % (ref 14.0–49.7)
MCH: 35.3 pg — ABNORMAL HIGH (ref 25.1–34.0)
MCHC: 33.7 g/dL (ref 31.5–36.0)
MCV: 104.7 fL — ABNORMAL HIGH (ref 79.5–101.0)
MONO#: 0.6 10*3/uL (ref 0.1–0.9)
MONO%: 6.3 % (ref 0.0–14.0)
NEUT#: 6.8 10*3/uL — ABNORMAL HIGH (ref 1.5–6.5)
NEUT%: 67.9 % (ref 38.4–76.8)
Platelets: 331 10*3/uL (ref 145–400)
RBC: 2.64 10*6/uL — ABNORMAL LOW (ref 3.70–5.45)
RDW: 22.9 % — ABNORMAL HIGH (ref 11.2–14.5)
WBC: 10 10*3/uL (ref 3.9–10.3)
lymph#: 2.3 10*3/uL (ref 0.9–3.3)

## 2017-01-05 MED ORDER — DIPHENHYDRAMINE HCL 25 MG PO CAPS
50.0000 mg | ORAL_CAPSULE | Freq: Once | ORAL | Status: AC
  Administered 2017-01-05: 50 mg via ORAL

## 2017-01-05 MED ORDER — DIPHENHYDRAMINE HCL 25 MG PO CAPS
ORAL_CAPSULE | ORAL | Status: AC
  Filled 2017-01-05: qty 2

## 2017-01-05 MED ORDER — SODIUM CHLORIDE 0.9% FLUSH
10.0000 mL | INTRAVENOUS | Status: DC | PRN
  Administered 2017-01-05: 10 mL
  Filled 2017-01-05: qty 10

## 2017-01-05 MED ORDER — SODIUM CHLORIDE 0.9 % IV SOLN
Freq: Once | INTRAVENOUS | Status: AC
  Administered 2017-01-05: 12:00:00 via INTRAVENOUS

## 2017-01-05 MED ORDER — TRASTUZUMAB CHEMO 150 MG IV SOLR
6.0000 mg/kg | Freq: Once | INTRAVENOUS | Status: AC
  Administered 2017-01-05: 651 mg via INTRAVENOUS
  Filled 2017-01-05: qty 31

## 2017-01-05 MED ORDER — ACETAMINOPHEN 325 MG PO TABS
ORAL_TABLET | ORAL | Status: AC
  Filled 2017-01-05: qty 2

## 2017-01-05 MED ORDER — ACETAMINOPHEN 325 MG PO TABS
650.0000 mg | ORAL_TABLET | Freq: Once | ORAL | Status: AC
  Administered 2017-01-05: 650 mg via ORAL

## 2017-01-05 MED ORDER — HEPARIN SOD (PORK) LOCK FLUSH 100 UNIT/ML IV SOLN
500.0000 [IU] | Freq: Once | INTRAVENOUS | Status: AC | PRN
  Administered 2017-01-05: 500 [IU]
  Filled 2017-01-05: qty 5

## 2017-01-05 NOTE — Patient Instructions (Signed)
[facility] Discharge Instructions for Patients Receiving Chemotherapy  Today you received the following chemotherapy agents: Herceptin   To help prevent nausea and vomiting after your treatment, we encourage you to take your nausea medication as directed.    If you develop nausea and vomiting that is not controlled by your nausea medication, call the clinic.   BELOW ARE SYMPTOMS THAT SHOULD BE REPORTED IMMEDIATELY:  *FEVER GREATER THAN 100.5 F  *CHILLS WITH OR WITHOUT FEVER  NAUSEA AND VOMITING THAT IS NOT CONTROLLED WITH YOUR NAUSEA MEDICATION  *UNUSUAL SHORTNESS OF BREATH  *UNUSUAL BRUISING OR BLEEDING  TENDERNESS IN MOUTH AND THROAT WITH OR WITHOUT PRESENCE OF ULCERS  *URINARY PROBLEMS  *BOWEL PROBLEMS  UNUSUAL RASH Items with * indicate a potential emergency and should be followed up as soon as possible.  Feel free to call the clinic you have any questions or concerns. The clinic phone number is [phone].  Please show the CHEMO ALERT CARD at check-in to the Emergency Department and triage nurse.   

## 2017-01-05 NOTE — Telephone Encounter (Signed)
Patient came to scheduling to reschedule appointments to accommodate her child's school schedule. Patient given AVS report and calendars with future scheduled appointments.

## 2017-01-22 NOTE — Progress Notes (Signed)
[place]  [phone] [phone]  Clinic Follow Up Note   Patient Care Team: Lindsey Fenton, NP as PCP - General (Internal Medicine) Lindsey Mesa, MD as Consulting Physician (General Surgery) 01/26/2017   CHIEF COMPLAINTS:  Follow up recurrent left breast cancer   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 [facility])   Staging form: Breast, AJCC 7th Edition     Clinical stage from 04/17/2016: Stage IA (T1c, N0, M0) - Signed by Lindsey Merle, MD on 05/07/2016     Pathologic stage from 05/28/2016: Stage IIA (T2, N0, cM0) - Signed by Lindsey Merle, MD on 06/11/2016 History of left breast cancer   Staging form: Breast, AJCC 7th Edition     Clinical: Stage IIIB (T4d, N1, cM0) - Signed by Lindsey Lark, MD on 12/14/2013     Pathologic: Stage IIIB (T4d, N1a, cM0) - Signed by Lindsey Lark, MD on 12/14/2013       History of left breast cancer   09/12/2002 Imaging    CT scan show no evidence of disease apart from the left axilla      09/2002 -  Neo-Adjuvant Chemotherapy    TAC X4 cycles       01/10/2003 Surgery    The patient had left lumpectomy and axillary lymph node dissection which show residual breast cancer and a sentinel lymph node was positive      2004 -  Adjuvant Chemotherapy    Cytoxan with 5-FU x4 cycles (methotrexate added at cycle 4).      2004 -  Radiation Therapy    Adjuvant left breast radiation      12/2002 Receptors her2    ER, PR and HER-2 were all negative.       Cancer of central portion of left breast ([place])   04/13/2016 Mammogram    Scheduler coarse heterogeneous calcifications spanning an area of 3.7 cm in the lumpectomy site, indeterminate. Ultrasound of the left axilla was negative.      04/17/2016 Initial Diagnosis    Cancer of central portion of left breast ([place])      04/17/2016 Initial Biopsy    Left breast core needle biopsy showed invasive and in situ ductal carcinoma with calcification, grade 2.      04/17/2016 Receptors her2    Your 100% positive, PR 15% positive, strong staining, Ki-67 10%, HER-2 positive by IHC (3)      05/05/2016 Imaging    Bilateral breast MRI showed a 1.3 x 0.8 x 0.7 cm biopsy-proven ductal carcinoma in the region of the previous lumpectomy. Enlarged lobulated right inferior axillary lymph node with diffuse cortical thickening, biopsy is recommended.      05/15/2016 Pathology Results    right axilary node biopsy was negative for malignant cell       05/28/2016 Surgery    Simple left mastectomy      05/28/2016 Pathology Results    Left mastectomy showed invasive ductal carcinoma, grade 3, 3.6 cm, high grade DCIS, (+) LVI, surgical margins were negative. Tumor 3.6 cm, no lymph nodes identified.      06/25/2016 Imaging    Staging CT scan of the chest, abdomen and pelvis with contrast and a bone scan showed no evidence of metastasis. Postoperative fluid collection in the left breast, likely a seroma or hematoma. Right axillary adenopathy, which was biopsied previously.       07/07/2016 -  Chemotherapy    Adjuvant chemotherapy with docetaxel, carboplatin, Herceptin and pejeta  every 3 weeks, for 6 cycles, followed by Herceptin maintenance therapy for total of one year treatment      12/15/2016 -  Anti-estrogen oral therapy    Adjuvant letrozole 2.5 mg once daily         HISTORY OF PRESENTING ILLNESS:  Lindsey Marquez 66 y.o. female is here because of her recurrent left breast cancer.  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 She was seen by multiple medical oncologist in our practice in the past, last was seen by Dr. Alvy Marquez in 11/2013.  She has been doing well, and is compliant with annual screening mammogram. The screening mammogram on 04/17/2016 showed calcification in the left breast, and core needle biopsy showed invasive and  in situ ductal carcinoma, grade 2, ER/PR positive, HER-2 amplified. She was referred to seeing breast surgeon Dr. Georgette Marquez and referred back to [place].   She feels well, she did not feel any lump in her breast or axillas latley. She denies any pain, dyspnea, or GI symptoms. She had right nipple rash and right axillary swelling in the past one week, after she drinks some diet tea with citric. Breast MRI showed an enlarged lobulated right inferior axillary lymph node with diffuse cortical thickening, in addition to the known left breast mass which measured 1.3 cm on MRI.  GYN HISTORY  Menarchal: 12 LMP: 2003 Contraceptive: 20 HRT: n/a  G2P2: 42 yo Son and 25 yo son who died   CURRENT THERAPY: maintenance Herceptin every 3 weeks, letrozole started on 12/15/2016  INTERIM HISTORY: Lindsey Marquez returns for follow up and 4th cycle herceptin. She has been doing well, but has been very tired. She is still able to do routine activities, but is exhausted after them. She doesn't have much of an appetite, and has to make herself eat. Denies leg swelling, hot flashes, or any other concerns.   MEDICAL HISTORY:  Past Medical History:  Diagnosis Date  . Anxiety   . Breast cancer [facility]) August 2002   Invasive ductal carcinoma Left breast.  . Diabetes mellitus without complication ([place])   . Family history of malignant neoplasm of ovary   . Family history of pancreatic cancer   . Hypertension     SURGICAL HISTORY: Past Surgical History:  Procedure Laterality Date  . BREAST SURGERY Left 2003  . FOOT SURGERY  2000  . MASTECTOMY Left 05/28/2016  . port a cath insertion  05/28/2016  . PORTACATH PLACEMENT Right 05/28/2016   Procedure: INSERTION PORT-A-CATH;  Surgeon: Lindsey Mesa, MD;  Location: [place];  Service: General;  Laterality: Right;  . TOTAL MASTECTOMY Left 05/28/2016   Procedure: LEFT  MASTECTOMY;  Surgeon: Lindsey Mesa, MD;  Location: [place];  Service: General;  Laterality: Left;  . TUBAL LIGATION  22 yrs  since  2004.   Bilateral.    SOCIAL HISTORY: Social History   Social History  . Marital status: Divorced    Spouse name: N/A  . Number of children: N/A  . Years of education: N/A   Occupational History  . Not on file.   Social History Main Topics  . Smoking status: Former Smoker    Packs/day: 0.10    Years: 7.00    Quit date: 12/01/1975  . Smokeless tobacco: Never Used  . Alcohol use No  . Drug use: No  . Sexual activity: Yes   Other Topics Concern  . Not  on file   Social History Narrative  . No narrative on file   She is retired from school   FAMILY HISTORY: Family History  Problem Relation Age of Onset  . Prostate cancer Father   . Ovarian cancer Maternal Aunt 9    deceased  . Pancreatic cancer Mother 86    deceased  . Cancer Maternal Aunt     2 other mat aunts; unk. primary in 20s; deceased  . Cancer Maternal Uncle     "bone ca"; unk. primary; deceased 49s  . Prostate cancer Maternal Uncle     deceased 62    ALLERGIES:  is allergic to codeine.  MEDICATIONS:  Current Outpatient Prescriptions  Medication Sig Dispense Refill  . aspirin 81 MG tablet Take 81 mg by mouth daily.     Marland Kitchen glucose blood (TRUE METRIX BLOOD GLUCOSE TEST) test strip 1 each by Other route 3 (three) times daily. Use as instructed 600 each 2  . glyBURIDE-metformin (GLUCOVANCE) 2.5-500 MG tablet Take 2 tablets by mouth 2 (two) times daily with a meal. 360 tablet 1  . ibuprofen (ADVIL,MOTRIN) 200 MG tablet Take 200 mg by mouth every 8 (eight) hours as needed.      Marland Kitchen letrozole (FEMARA) 2.5 MG tablet Take 1 tablet (2.5 mg total) by mouth daily. 30 tablet 3  . lidocaine-prilocaine (EMLA) cream Apply to affected area once 30 g 3  . lisinopril-hydrochlorothiazide (PRINZIDE,ZESTORETIC) 20-25 MG tablet Take 1 tablet by mouth daily. 90 tablet 1  . Omega-3 Fatty Acids (FISH OIL) 1200 MG CAPS Take 1 capsule by mouth daily.    . ondansetron (ZOFRAN) 8 MG tablet Take 1 tablet (8 mg total) by mouth 2  (two) times daily as needed for refractory nausea / vomiting. Start on day 3 after chemo. 30 tablet 1  . Potassium Chloride ER 20 MEQ TBCR Take 40 mEq by mouth daily. 60 tablet 1  . prochlorperazine (COMPAZINE) 10 MG tablet Take 1 tablet (10 mg total) by mouth every 6 (six) hours as needed (Nausea or vomiting). 30 tablet 1  . torsemide (DEMADEX) 20 MG tablet TAKE 1 TABLET DAILY 3 DAYS, IF NO IMPROVEMENT, INCREASE TO 1 TABLET BY MOUTH TWICE DAILY 60 tablet 0  . traMADol (ULTRAM) 50 MG tablet Take 2 tablets (100 mg total) by mouth every 6 (six) hours as needed for moderate pain. 20 tablet 0  . TRUEPLUS LANCETS 33G MISC 1 each by Does not apply route 3 (three) times daily. 600 each 2   No current facility-administered medications for this visit.     REVIEW OF SYSTEMS:   Constitutional: Denies fevers, chills or abnormal night sweats (+) fatigue (+) loss of appetite.  Eyes: Denies blurriness of vision, double vision or watery eyes Ears, nose, mouth, throat, and face: Denies mucositis or sore throat Respiratory: Denies cough, dyspnea or wheezes Cardiovascular: Denies palpitation, chest discomfort  Gastrointestinal:  Denies nausea, heartburn or change in bowel habits Skin: Denies abnormal skin rashes Lymphatics: Denies new lymphadenopathy or easy bruising Neurological:Denies numbness, tingling or new weaknesses Behavioral/Psych: Mood is stable, no new changes  All other systems were reviewed with the patient and are negative.  PHYSICAL EXAMINATION: ECOG PERFORMANCE STATUS: 1  Vitals:   01/26/17 0850  BP: (!) 124/57  Pulse: 96  Resp: 18  Temp: 98.6 F (37 C)   Filed Weights   01/26/17 0850  Weight: 230 lb (104.3 kg)   GENERAL:alert, no distress and comfortable SKIN: skin color, texture, turgor are normal, no  rashes or significant lesions EYES: normal, conjunctiva are pink and non-injected, sclera clear OROPHARYNX:no exudate, no erythema and lips, buccal mucosa, and tongue normal    NECK: supple, thyroid normal size, non-tender, without nodularity LYMPH:  no palpable lymphadenopathy in the cervical, axillary or inguinal LUNGS: clear to auscultation and percussion with normal breathing effort HEART: regular rate & rhythm and no murmurs and no lower extremity edema ABDOMEN:abdomen soft, non-tender and normal bowel sounds Musculoskeletal:no cyanosis of digits and no clubbing  PSYCH: alert & oriented x 3 with fluent speech NEURO: no focal motor/sensory deficits, (+) decreased vibration sensation on both hands, no weakness. Breasts: Breast inspection showed left breast is surgically absent, surgical incision has healed well. Exam of the right breast and bilateral axillas reviewed no palpable mass or adenopathy.  LABORATORY DATA:  I have reviewed the data as listed CBC Latest Ref Rng & Units 01/26/2017 01/05/2017 12/15/2016  WBC 3.9 - 10.3 10e3/uL 10.2 10.0 7.0  Hemoglobin 11.6 - 15.9 g/dL 9.2(L) 9.3(L) 8.6(L)  Hematocrit 34.8 - 46.6 % 26.5(L) 27.7(L) 25.7(L)  Platelets 145 - 400 10e3/uL 291 331 287   CMP Latest Ref Rng & Units 01/26/2017 01/05/2017 12/15/2016  Glucose 70 - 140 mg/dl 69(L) 163(H) 84  BUN 7.0 - 26.0 mg/dL 32.2(H) 28.4(H) 22.8  Creatinine 0.6 - 1.1 mg/dL 1.4(H) 1.5(H) 1.1  Sodium 136 - 145 mEq/L 141 142 142  Potassium 3.5 - 5.1 mEq/L 4.0 4.0 3.2(L)  Chloride 96 - 112 mEq/L - - -  CO2 22 - 29 mEq/L 24 25 22   Calcium 8.4 - 10.4 mg/dL 9.3 9.5 8.3(L)  Total Protein 6.4 - 8.3 g/dL 8.0 8.1 7.8  Total Bilirubin 0.20 - 1.20 mg/dL 0.34 0.33 0.43  Alkaline Phos 40 - 150 U/L 65 60 62  AST 5 - 34 U/L 17 18 20   ALT 0 - 55 U/L 15 13 12     PATHOLOGY REPORT  Diagnosis 04/17/2016 Breast, left, needle core biopsy, inner mid breast - INVASIVE DUCTAL CARCINOMA WITH CALCIFICATIONS, SEE COMMENT. - DUCTAL CARCINOMA IN SITU WITH COMEDONECROSIS. Microscopic Comment While grading is best performed on the resection specimen the invasive carcinoma appears grade 2. Prognostic  markers will be ordered and reported in an addendum. Dr. Lyndon Code has reviewed the case. The case was called to [facility] on 04/20/2016.  Results: HER2 - *EQUIVOCAL* (Her2 by IHC will be ordered.) RATIO OF HER2/CEP17 SIGNALS 1.73 AVERAGE HER2 COPY NUMBER PER CELL 4.15 By immunohistochemistry, the tumor cells are positive for Her2 (3).  Results: IMMUNOHISTOCHEMICAL AND MORPHOMETRIC ANALYSIS PERFORMED MANUALLY Estrogen Receptor: 100%, POSITIVE, STRONG STAINING INTENSITY Progesterone Receptor: 15%, POSITIVE, STRONG STAINING INTENSITY Proliferation Marker Ki67: 10%  Diagnosis 05/15/2016 Lymph node, needle/core biopsy, right axilla - BENIGN REACTIVE LYMPH NODE. - NO GRANULOMAS OR MALIGNANCY IDENTIFIED. - SEE COMMENT. Microscopic Comment Internal departmental review obtained (Dr. Lyndon Code) with agreement. Results are phoned to [facility] (05/18/16). (MEG:gt, 05/18/16)  Breast, simple mastectomy, Left 05/28/2016 - INVASIVE DUCTAL CARCINOMA, GRADE III/III, SPANNING 3.6 CM. - DUCTAL CARCINOMA IN SITU, HIGH GRADE. - LYMPHOVASCULAR INVASION IS IDENTIFIED. - THE SURGICAL RESECTION MARGINS ARE NEGATIVE FOR CARCINOMA. - SEE ONCOLOGY TABLE BELOW. Microscopic Comment BREAST, INVASIVE TUMOR, WITHOUT LYMPH NODES PRESENT Specimen, including laterality : Left breast Procedure: Simple mastectomy Histologic type: Ductal with micropapillary features Grade: III Tubule formation: 3 Nuclear pleomorphism: 2 Mitotic: 3 Tumor size (gross measurement): 3.6 cm Margins: Greater than 0.2 cm to all margins Lymphovascular invasion: Present Ductal carcinoma in situ:  Present Grade: High grade Extensive intraductal component: Yes Lobular neoplasia: Not identified Tumor focality: Unifocal Treatment effect: N/A Extent of tumor: Confined to breast parenchyma. Breast prognostic profile: [phone] Estrogen receptor: 100%, strong staining intensity Progesterone  receptor: 15%, strong staining intensity Her 2 neu: Amplification was detected by immunohistochemistry. Ki-67: 10% 1 of 2 FINAL for ARIAL, GALLIGAN ([id number]) Microscopic Comment(continued) Non-neoplastic breast: No significant findings. TNM: pT2, pNX (clinically recurrent). Comments: In addition to the main tumor, there is ductal carcinoma in situ present in random tissue submitted from the inferior lateral quadrant. (JBK:kh 06-01-16)  RADIOGRAPHIC STUDIES: I have personally reviewed the radiological images as listed and agreed with the findings in the report. No results found.   DG Chest 2 View 10/28/2016 IMPRESSION: No acute cardiopulmonary abnormality.  ASSESSMENT & PLAN:  66 y.o. 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  1. Cancer of central portion of left breast, pT2NxM0, stage IIA, G3, triple positive -I previously reviewed her surgical pathology findings in great details with patient and her family member. It showed a 3.6 cm mass, grade 3, surgical margins were negative. She had no sentinel lymph node biopsy due to her prior history of axillary lymph node dissection. -Her restaging CT scan of bone scan showed no evidence of distant metastasis, I reviewed with patient. -We again discussed the risk of cancer recurrence of this complete surgical resection. We reviewed that HER-2 positive with cancers are more aggressive, with increased risk of metastasis.  -Given the moderate to high risk of cancer recurrence, I recommend adjuvant chemotherapy to reduce her risk of cancer recurrence. I recommend chemotherapy and duo anti-HER-2 agents TCHP (docetaxel, carbotaxol, Herceptin, pejeta), every 3 weeks for total of 6 cycles, followed by maintenance Herceptin every 3 weeks to complete 1 year treatment. -She has completed adjuvant chemotherapy, on maintenance  Herceptin now, doing well overall. -She has started adjuvant antiestrogen therapy letrozole, tolerating well, we'll continue for 5-7 years. -She is due for a repeat mammogram in May 2018.  -She is overdue for Echo, I sent a message to Dr. Clayborne Dana office -I have recommended eating healthy and taking a multivitamin.   2. Anemia  -Secondary to chemotherapy, her hemoglobin is 9.6, she has mild symptoms from anemia -She felt better after last blood transfusion.  -Check iron next visit. If it is low, she will start taking an iron pill. For now, just take a multivitamin.   3. DM and HTN  -She'll continue follow-up with her primary care physician -We again discussed her blood pressure and glucose level may be impacted by chemotherapy, and dexamethasone she takes before and after chemotherapy. -I strongly encouraged her to monitor her blood pressure and glucose at home, and consider increasing the dose of metformin and glyburide if needed -her BP and blood glucose has been normal/well controlled lately   4. Obesity  -I encouraged her to eat healthy and exercise regularly, she is willing to lose some weight after she completes chemotherapy.  5. 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 She does not feel she needs medication for now.  6. Fatigue  -Probably related to her previous chemotherapy and anemia -I encouraged her to gradually increase her exercise   7. Bone Health -We discussed that Letrozole can cause some bone weakening  -She has had a bone density scan in the past, but it was several years ago -  I have ordered a bone density scan  Plan -4th cycle of maintenance Herceptin today and continue every 3 weeks -Continue letrozole  -she will have repeated echo before next treatment  -Ordered bone density scan -Order mammogram for May 2018 -RTC in 9 weeks for follow-up and labs. Check iron at this visit.     All questions were answered. The patient knows to call the clinic with any problems, questions or concerns.  I spent 20 minutes counseling the patient face to face. The total time spent in the appointment was 25 minutes and more than 50% was on counseling.  This document serves as a record of services personally performed by Lindsey Merle, MD. It was created on her behalf by Martinique Casey, a trained medical scribe. The creation of this record is based on the scribe's personal observations and the provider's statements to them. This document has been checked and approved by the attending provider.   I have reviewed the above documentation for accuracy and completeness and I agree with the above.   Lindsey Merle, MD 01/26/2017

## 2017-01-26 ENCOUNTER — Other Ambulatory Visit (HOSPITAL_BASED_OUTPATIENT_CLINIC_OR_DEPARTMENT_OTHER): Payer: Medicare PPO

## 2017-01-26 ENCOUNTER — Telehealth: Payer: Self-pay | Admitting: Hematology

## 2017-01-26 ENCOUNTER — Encounter: Payer: Self-pay | Admitting: Hematology

## 2017-01-26 ENCOUNTER — Ambulatory Visit (HOSPITAL_BASED_OUTPATIENT_CLINIC_OR_DEPARTMENT_OTHER): Payer: Medicare PPO

## 2017-01-26 ENCOUNTER — Other Ambulatory Visit: Payer: Medicare PPO

## 2017-01-26 ENCOUNTER — Ambulatory Visit (HOSPITAL_BASED_OUTPATIENT_CLINIC_OR_DEPARTMENT_OTHER): Payer: Medicare PPO | Admitting: Hematology

## 2017-01-26 VITALS — BP 124/57 | HR 96 | Temp 98.6°F | Resp 18 | Ht 69.0 in | Wt 230.0 lb

## 2017-01-26 DIAGNOSIS — Z780 Asymptomatic menopausal state: Secondary | ICD-10-CM

## 2017-01-26 DIAGNOSIS — C50112 Malignant neoplasm of central portion of left female breast: Secondary | ICD-10-CM

## 2017-01-26 DIAGNOSIS — G620 Drug-induced polyneuropathy: Secondary | ICD-10-CM

## 2017-01-26 DIAGNOSIS — I10 Essential (primary) hypertension: Secondary | ICD-10-CM | POA: Diagnosis not present

## 2017-01-26 DIAGNOSIS — Z170 Estrogen receptor positive status [ER+]: Secondary | ICD-10-CM

## 2017-01-26 DIAGNOSIS — Z5112 Encounter for antineoplastic immunotherapy: Secondary | ICD-10-CM | POA: Diagnosis not present

## 2017-01-26 DIAGNOSIS — Z853 Personal history of malignant neoplasm of breast: Secondary | ICD-10-CM

## 2017-01-26 DIAGNOSIS — E119 Type 2 diabetes mellitus without complications: Secondary | ICD-10-CM | POA: Diagnosis not present

## 2017-01-26 DIAGNOSIS — D6481 Anemia due to antineoplastic chemotherapy: Secondary | ICD-10-CM

## 2017-01-26 DIAGNOSIS — D509 Iron deficiency anemia, unspecified: Secondary | ICD-10-CM

## 2017-01-26 DIAGNOSIS — E669 Obesity, unspecified: Secondary | ICD-10-CM

## 2017-01-26 LAB — CBC WITH DIFFERENTIAL/PLATELET
BASO%: 0.3 % (ref 0.0–2.0)
Basophils Absolute: 0 10*3/uL (ref 0.0–0.1)
EOS%: 2 % (ref 0.0–7.0)
Eosinophils Absolute: 0.2 10*3/uL (ref 0.0–0.5)
HCT: 26.5 % — ABNORMAL LOW (ref 34.8–46.6)
HGB: 9.2 g/dL — ABNORMAL LOW (ref 11.6–15.9)
LYMPH%: 21.1 % (ref 14.0–49.7)
MCH: 36.5 pg — ABNORMAL HIGH (ref 25.1–34.0)
MCHC: 34.8 g/dL (ref 31.5–36.0)
MCV: 104.7 fL — ABNORMAL HIGH (ref 79.5–101.0)
MONO#: 0.7 10*3/uL (ref 0.1–0.9)
MONO%: 7.2 % (ref 0.0–14.0)
NEUT#: 7.1 10*3/uL — ABNORMAL HIGH (ref 1.5–6.5)
NEUT%: 69.4 % (ref 38.4–76.8)
Platelets: 291 10*3/uL (ref 145–400)
RBC: 2.53 10*6/uL — ABNORMAL LOW (ref 3.70–5.45)
RDW: 19.4 % — ABNORMAL HIGH (ref 11.2–14.5)
WBC: 10.2 10*3/uL (ref 3.9–10.3)
lymph#: 2.1 10*3/uL (ref 0.9–3.3)

## 2017-01-26 LAB — COMPREHENSIVE METABOLIC PANEL
ALT: 15 U/L (ref 0–55)
AST: 17 U/L (ref 5–34)
Albumin: 3.8 g/dL (ref 3.5–5.0)
Alkaline Phosphatase: 65 U/L (ref 40–150)
Anion Gap: 12 mEq/L — ABNORMAL HIGH (ref 3–11)
BUN: 32.2 mg/dL — ABNORMAL HIGH (ref 7.0–26.0)
CO2: 24 mEq/L (ref 22–29)
Calcium: 9.3 mg/dL (ref 8.4–10.4)
Chloride: 105 mEq/L (ref 98–109)
Creatinine: 1.4 mg/dL — ABNORMAL HIGH (ref 0.6–1.1)
EGFR: 45 mL/min/{1.73_m2} — ABNORMAL LOW (ref >90)
Glucose: 69 mg/dl — ABNORMAL LOW (ref 70–140)
Potassium: 4 mEq/L (ref 3.5–5.1)
Sodium: 141 mEq/L (ref 136–145)
Total Bilirubin: 0.34 mg/dL (ref 0.20–1.20)
Total Protein: 8 g/dL (ref 6.4–8.3)

## 2017-01-26 MED ORDER — DIPHENHYDRAMINE HCL 25 MG PO CAPS
50.0000 mg | ORAL_CAPSULE | Freq: Once | ORAL | Status: AC
  Administered 2017-01-26: 50 mg via ORAL

## 2017-01-26 MED ORDER — SODIUM CHLORIDE 0.9% FLUSH
10.0000 mL | INTRAVENOUS | Status: DC | PRN
  Administered 2017-01-26: 10 mL
  Filled 2017-01-26: qty 10

## 2017-01-26 MED ORDER — SODIUM CHLORIDE 0.9 % IV SOLN
Freq: Once | INTRAVENOUS | Status: AC
  Administered 2017-01-26: 11:00:00 via INTRAVENOUS

## 2017-01-26 MED ORDER — DIPHENHYDRAMINE HCL 25 MG PO CAPS
ORAL_CAPSULE | ORAL | Status: AC
  Filled 2017-01-26: qty 2

## 2017-01-26 MED ORDER — ACETAMINOPHEN 325 MG PO TABS
650.0000 mg | ORAL_TABLET | Freq: Once | ORAL | Status: AC
  Administered 2017-01-26: 650 mg via ORAL

## 2017-01-26 MED ORDER — ACETAMINOPHEN 325 MG PO TABS
ORAL_TABLET | ORAL | Status: AC
  Filled 2017-01-26: qty 2

## 2017-01-26 MED ORDER — SODIUM CHLORIDE 0.9 % IV SOLN
6.0000 mg/kg | Freq: Once | INTRAVENOUS | Status: AC
  Administered 2017-01-26: 651 mg via INTRAVENOUS
  Filled 2017-01-26: qty 31

## 2017-01-26 MED ORDER — HEPARIN SOD (PORK) LOCK FLUSH 100 UNIT/ML IV SOLN
500.0000 [IU] | Freq: Once | INTRAVENOUS | Status: AC | PRN
  Administered 2017-01-26: 500 [IU]
  Filled 2017-01-26: qty 5

## 2017-01-26 NOTE — Telephone Encounter (Signed)
Appointments scheduled per 2/27 LOS. Patient given AVS report and calendars with future scheduled appointments. °

## 2017-01-26 NOTE — Patient Instructions (Signed)
[facility] Discharge Instructions for Patients Receiving Chemotherapy  Today you received the following chemotherapy agents:  Herceptin  To help prevent nausea and vomiting after your treatment, we encourage you to take your nausea medication as prescribed.   If you develop nausea and vomiting that is not controlled by your nausea medication, call the clinic.   BELOW ARE SYMPTOMS THAT SHOULD BE REPORTED IMMEDIATELY:  *FEVER GREATER THAN 100.5 F  *CHILLS WITH OR WITHOUT FEVER  NAUSEA AND VOMITING THAT IS NOT CONTROLLED WITH YOUR NAUSEA MEDICATION  *UNUSUAL SHORTNESS OF BREATH  *UNUSUAL BRUISING OR BLEEDING  TENDERNESS IN MOUTH AND THROAT WITH OR WITHOUT PRESENCE OF ULCERS  *URINARY PROBLEMS  *BOWEL PROBLEMS  UNUSUAL RASH Items with * indicate a potential emergency and should be followed up as soon as possible.  Feel free to call the clinic you have any questions or concerns. The clinic phone number is [phone].  Please show the CHEMO ALERT CARD at check-in to the Emergency Department and triage nurse.   

## 2017-02-09 ENCOUNTER — Other Ambulatory Visit: Payer: Self-pay | Admitting: Hematology

## 2017-02-09 DIAGNOSIS — Z1231 Encounter for screening mammogram for malignant neoplasm of breast: Secondary | ICD-10-CM

## 2017-02-09 DIAGNOSIS — Z9012 Acquired absence of left breast and nipple: Secondary | ICD-10-CM

## 2017-02-11 ENCOUNTER — Ambulatory Visit (HOSPITAL_COMMUNITY)
Admission: RE | Admit: 2017-02-11 | Discharge: 2017-02-11 | Disposition: A | Discharge status: Home / Self Care | Payer: Medicare PPO | Source: Ambulatory Visit | Attending: Internal Medicine | Admitting: Internal Medicine

## 2017-02-11 ENCOUNTER — Encounter (HOSPITAL_COMMUNITY): Payer: Self-pay

## 2017-02-11 ENCOUNTER — Encounter (HOSPITAL_COMMUNITY): Payer: Self-pay | Admitting: Internal Medicine

## 2017-02-11 DIAGNOSIS — I501 Left ventricular failure: Secondary | ICD-10-CM | POA: Diagnosis not present

## 2017-02-11 DIAGNOSIS — C50011 Malignant neoplasm of nipple and areola, right female breast: Secondary | ICD-10-CM

## 2017-02-11 NOTE — Progress Notes (Signed)
  Echocardiogram 2D Echocardiogram has been performed.  Lindsey Marquez 02/11/2017, 1:50 PM

## 2017-02-15 ENCOUNTER — Other Ambulatory Visit: Payer: Self-pay | Admitting: Internal Medicine

## 2017-02-15 DIAGNOSIS — R609 Edema, unspecified: Secondary | ICD-10-CM

## 2017-02-15 NOTE — Telephone Encounter (Signed)
Please advise if okay to refill. 

## 2017-02-16 ENCOUNTER — Ambulatory Visit (HOSPITAL_BASED_OUTPATIENT_CLINIC_OR_DEPARTMENT_OTHER): Payer: Medicare PPO

## 2017-02-16 ENCOUNTER — Other Ambulatory Visit (HOSPITAL_BASED_OUTPATIENT_CLINIC_OR_DEPARTMENT_OTHER): Payer: Medicare PPO

## 2017-02-16 ENCOUNTER — Ambulatory Visit: Payer: Medicare PPO

## 2017-02-16 VITALS — BP 130/65 | HR 95 | Temp 98.5°F | Resp 18

## 2017-02-16 DIAGNOSIS — C50112 Malignant neoplasm of central portion of left female breast: Secondary | ICD-10-CM

## 2017-02-16 DIAGNOSIS — Z95828 Presence of other vascular implants and grafts: Secondary | ICD-10-CM

## 2017-02-16 DIAGNOSIS — D6481 Anemia due to antineoplastic chemotherapy: Secondary | ICD-10-CM

## 2017-02-16 DIAGNOSIS — Z5112 Encounter for antineoplastic immunotherapy: Secondary | ICD-10-CM | POA: Diagnosis not present

## 2017-02-16 LAB — IRON AND TIBC
%SAT: 27 % (ref 21–57)
Iron: 88 ug/dL (ref 41–142)
TIBC: 326 ug/dL (ref 236–444)
UIBC: 238 ug/dL (ref 120–384)

## 2017-02-16 LAB — COMPREHENSIVE METABOLIC PANEL
ALT: 24 U/L (ref 0–55)
AST: 22 U/L (ref 5–34)
Albumin: 3.9 g/dL (ref 3.5–5.0)
Alkaline Phosphatase: 66 U/L (ref 40–150)
Anion Gap: 13 mEq/L — ABNORMAL HIGH (ref 3–11)
BUN: 39.4 mg/dL — ABNORMAL HIGH (ref 7.0–26.0)
CO2: 24 mEq/L (ref 22–29)
Calcium: 9.6 mg/dL (ref 8.4–10.4)
Chloride: 103 mEq/L (ref 98–109)
Creatinine: 1.2 mg/dL — ABNORMAL HIGH (ref 0.6–1.1)
EGFR: 56 mL/min/{1.73_m2} — ABNORMAL LOW (ref >90)
Glucose: 125 mg/dl (ref 70–140)
Potassium: 4.1 mEq/L (ref 3.5–5.1)
Sodium: 140 mEq/L (ref 136–145)
Total Bilirubin: 0.35 mg/dL (ref 0.20–1.20)
Total Protein: 7.9 g/dL (ref 6.4–8.3)

## 2017-02-16 LAB — CBC WITH DIFFERENTIAL/PLATELET
BASO%: 0.1 % (ref 0.0–2.0)
Basophils Absolute: 0 10*3/uL (ref 0.0–0.1)
EOS%: 2.3 % (ref 0.0–7.0)
Eosinophils Absolute: 0.2 10*3/uL (ref 0.0–0.5)
HCT: 26.6 % — ABNORMAL LOW (ref 34.8–46.6)
HGB: 8.9 g/dL — ABNORMAL LOW (ref 11.6–15.9)
LYMPH%: 34.4 % (ref 14.0–49.7)
MCH: 34.9 pg — ABNORMAL HIGH (ref 25.1–34.0)
MCHC: 33.5 g/dL (ref 31.5–36.0)
MCV: 104.3 fL — ABNORMAL HIGH (ref 79.5–101.0)
MONO#: 0.4 10*3/uL (ref 0.1–0.9)
MONO%: 5 % (ref 0.0–14.0)
NEUT#: 4.3 10*3/uL (ref 1.5–6.5)
NEUT%: 58.2 % (ref 38.4–76.8)
Platelets: 257 10*3/uL (ref 145–400)
RBC: 2.55 10*6/uL — ABNORMAL LOW (ref 3.70–5.45)
RDW: 14.7 % — ABNORMAL HIGH (ref 11.2–14.5)
WBC: 7.4 10*3/uL (ref 3.9–10.3)
lymph#: 2.5 10*3/uL (ref 0.9–3.3)

## 2017-02-16 LAB — FERRITIN: Ferritin: 419 ng/ml — ABNORMAL HIGH (ref 9–269)

## 2017-02-16 MED ORDER — DIPHENHYDRAMINE HCL 25 MG PO CAPS
ORAL_CAPSULE | ORAL | Status: AC
  Filled 2017-02-16: qty 1

## 2017-02-16 MED ORDER — ACETAMINOPHEN 325 MG PO TABS
650.0000 mg | ORAL_TABLET | Freq: Once | ORAL | Status: AC
Start: 2017-02-16 — End: 2017-02-16
  Administered 2017-02-16: 650 mg via ORAL

## 2017-02-16 MED ORDER — HEPARIN SOD (PORK) LOCK FLUSH 100 UNIT/ML IV SOLN
500.0000 [IU] | Freq: Once | INTRAVENOUS | Status: AC | PRN
  Administered 2017-02-16: 500 [IU]
  Filled 2017-02-16: qty 5

## 2017-02-16 MED ORDER — SODIUM CHLORIDE 0.9 % IJ SOLN
10.0000 mL | INTRAMUSCULAR | Status: DC | PRN
  Administered 2017-02-16 (×2): 10 mL via INTRAVENOUS
  Filled 2017-02-16: qty 10

## 2017-02-16 MED ORDER — ACETAMINOPHEN 325 MG PO TABS
ORAL_TABLET | ORAL | Status: AC
  Filled 2017-02-16: qty 2

## 2017-02-16 MED ORDER — SODIUM CHLORIDE 0.9 % IV SOLN
Freq: Once | INTRAVENOUS | Status: AC
  Administered 2017-02-16: 09:00:00 via INTRAVENOUS

## 2017-02-16 MED ORDER — TRASTUZUMAB CHEMO 150 MG IV SOLR
6.0000 mg/kg | Freq: Once | INTRAVENOUS | Status: AC
  Administered 2017-02-16: 651 mg via INTRAVENOUS
  Filled 2017-02-16: qty 31

## 2017-02-16 MED ORDER — DIPHENHYDRAMINE HCL 25 MG PO CAPS
50.0000 mg | ORAL_CAPSULE | Freq: Once | ORAL | Status: AC
  Administered 2017-02-16: 25 mg via ORAL

## 2017-02-16 MED ORDER — SODIUM CHLORIDE 0.9% FLUSH
10.0000 mL | INTRAVENOUS | Status: DC | PRN
  Filled 2017-02-16: qty 10

## 2017-02-16 NOTE — Telephone Encounter (Signed)
No, her kidney function is getting worse because of the medication

## 2017-02-16 NOTE — Patient Instructions (Signed)

## 2017-02-16 NOTE — Patient Instructions (Signed)
[facility] Discharge Instructions for Patients Receiving Chemotherapy  Today you received the following chemotherapy agents:  Herceptin  To help prevent nausea and vomiting after your treatment, we encourage you to take your nausea medication as prescribed.   If you develop nausea and vomiting that is not controlled by your nausea medication, call the clinic.   BELOW ARE SYMPTOMS THAT SHOULD BE REPORTED IMMEDIATELY:  *FEVER GREATER THAN 100.5 F  *CHILLS WITH OR WITHOUT FEVER  NAUSEA AND VOMITING THAT IS NOT CONTROLLED WITH YOUR NAUSEA MEDICATION  *UNUSUAL SHORTNESS OF BREATH  *UNUSUAL BRUISING OR BLEEDING  TENDERNESS IN MOUTH AND THROAT WITH OR WITHOUT PRESENCE OF ULCERS  *URINARY PROBLEMS  *BOWEL PROBLEMS  UNUSUAL RASH Items with * indicate a potential emergency and should be followed up as soon as possible.  Feel free to call the clinic you have any questions or concerns. The clinic phone number is [phone].  Please show the CHEMO ALERT CARD at check-in to the Emergency Department and triage nurse.   

## 2017-03-06 ENCOUNTER — Telehealth: Payer: Self-pay | Admitting: Hematology

## 2017-03-06 NOTE — Telephone Encounter (Signed)
Due to Tuesday is no YF off day spoke with patient re adjustments to appointments. Spoke with patient re appointments for 4/10 and 5/2. Patient agrees to come in on wednesdays starting 5/2. YF will not see patient with 4/10 appointments - patient aware.

## 2017-03-08 ENCOUNTER — Other Ambulatory Visit: Payer: Self-pay | Admitting: Hematology

## 2017-03-09 ENCOUNTER — Ambulatory Visit (HOSPITAL_BASED_OUTPATIENT_CLINIC_OR_DEPARTMENT_OTHER): Payer: Medicare PPO

## 2017-03-09 ENCOUNTER — Other Ambulatory Visit (HOSPITAL_BASED_OUTPATIENT_CLINIC_OR_DEPARTMENT_OTHER): Payer: Medicare PPO

## 2017-03-09 ENCOUNTER — Ambulatory Visit: Payer: Medicare PPO | Admitting: Hematology

## 2017-03-09 ENCOUNTER — Ambulatory Visit: Payer: Medicare PPO

## 2017-03-09 VITALS — BP 116/51 | HR 88 | Temp 98.7°F | Resp 18

## 2017-03-09 DIAGNOSIS — C50112 Malignant neoplasm of central portion of left female breast: Secondary | ICD-10-CM

## 2017-03-09 DIAGNOSIS — Z95828 Presence of other vascular implants and grafts: Secondary | ICD-10-CM

## 2017-03-09 DIAGNOSIS — Z5111 Encounter for antineoplastic chemotherapy: Secondary | ICD-10-CM

## 2017-03-09 LAB — CBC WITH DIFFERENTIAL/PLATELET
BASO%: 0.4 % (ref 0.0–2.0)
Basophils Absolute: 0 10*3/uL (ref 0.0–0.1)
EOS%: 2.2 % (ref 0.0–7.0)
Eosinophils Absolute: 0.2 10*3/uL (ref 0.0–0.5)
HCT: 28.8 % — ABNORMAL LOW (ref 34.8–46.6)
HGB: 9.8 g/dL — ABNORMAL LOW (ref 11.6–15.9)
LYMPH%: 27.5 % (ref 14.0–49.7)
MCH: 35.6 pg — ABNORMAL HIGH (ref 25.1–34.0)
MCHC: 34.1 g/dL (ref 31.5–36.0)
MCV: 104.4 fL — ABNORMAL HIGH (ref 79.5–101.0)
MONO#: 0.6 10*3/uL (ref 0.1–0.9)
MONO%: 7.8 % (ref 0.0–14.0)
NEUT#: 4.6 10*3/uL (ref 1.5–6.5)
NEUT%: 62.1 % (ref 38.4–76.8)
Platelets: 309 10*3/uL (ref 145–400)
RBC: 2.76 10*6/uL — ABNORMAL LOW (ref 3.70–5.45)
RDW: 13.5 % (ref 11.2–14.5)
WBC: 7.4 10*3/uL (ref 3.9–10.3)
lymph#: 2 10*3/uL (ref 0.9–3.3)

## 2017-03-09 LAB — COMPREHENSIVE METABOLIC PANEL
ALT: 31 U/L (ref 0–55)
AST: 34 U/L (ref 5–34)
Albumin: 4.2 g/dL (ref 3.5–5.0)
Alkaline Phosphatase: 66 U/L (ref 40–150)
Anion Gap: 14 mEq/L — ABNORMAL HIGH (ref 3–11)
BUN: 42 mg/dL — ABNORMAL HIGH (ref 7.0–26.0)
CO2: 23 mEq/L (ref 22–29)
Calcium: 10.7 mg/dL — ABNORMAL HIGH (ref 8.4–10.4)
Chloride: 104 mEq/L (ref 98–109)
Creatinine: 1.5 mg/dL — ABNORMAL HIGH (ref 0.6–1.1)
EGFR: 40 mL/min/{1.73_m2} — ABNORMAL LOW (ref >90)
Glucose: 123 mg/dl (ref 70–140)
Potassium: 4.4 mEq/L (ref 3.5–5.1)
Sodium: 141 mEq/L (ref 136–145)
Total Bilirubin: 0.38 mg/dL (ref 0.20–1.20)
Total Protein: 8.5 g/dL — ABNORMAL HIGH (ref 6.4–8.3)

## 2017-03-09 MED ORDER — DIPHENHYDRAMINE HCL 25 MG PO CAPS
50.0000 mg | ORAL_CAPSULE | Freq: Once | ORAL | Status: AC
  Administered 2017-03-09: 25 mg via ORAL

## 2017-03-09 MED ORDER — HEPARIN SOD (PORK) LOCK FLUSH 100 UNIT/ML IV SOLN
500.0000 [IU] | Freq: Once | INTRAVENOUS | Status: AC | PRN
  Administered 2017-03-09: 500 [IU]
  Filled 2017-03-09: qty 5

## 2017-03-09 MED ORDER — SODIUM CHLORIDE 0.9% FLUSH
10.0000 mL | INTRAVENOUS | Status: DC | PRN
  Administered 2017-03-09: 10 mL
  Filled 2017-03-09: qty 10

## 2017-03-09 MED ORDER — ACETAMINOPHEN 325 MG PO TABS
ORAL_TABLET | ORAL | Status: AC
  Filled 2017-03-09: qty 2

## 2017-03-09 MED ORDER — SODIUM CHLORIDE 0.9 % IV SOLN
Freq: Once | INTRAVENOUS | Status: AC
  Administered 2017-03-09: 10:00:00 via INTRAVENOUS

## 2017-03-09 MED ORDER — SODIUM CHLORIDE 0.9 % IJ SOLN
10.0000 mL | INTRAMUSCULAR | Status: DC | PRN
  Administered 2017-03-09: 10 mL via INTRAVENOUS
  Filled 2017-03-09: qty 10

## 2017-03-09 MED ORDER — TRASTUZUMAB CHEMO 150 MG IV SOLR
6.0000 mg/kg | Freq: Once | INTRAVENOUS | Status: AC
  Administered 2017-03-09: 651 mg via INTRAVENOUS
  Filled 2017-03-09: qty 31

## 2017-03-09 MED ORDER — DIPHENHYDRAMINE HCL 25 MG PO CAPS
ORAL_CAPSULE | ORAL | Status: AC
  Filled 2017-03-09: qty 1

## 2017-03-09 MED ORDER — ACETAMINOPHEN 325 MG PO TABS
650.0000 mg | ORAL_TABLET | Freq: Once | ORAL | Status: AC
  Administered 2017-03-09: 650 mg via ORAL

## 2017-03-09 NOTE — Patient Instructions (Signed)

## 2017-03-09 NOTE — Patient Instructions (Signed)
[facility] Discharge Instructions for Patients Receiving Chemotherapy  Today you received the following chemotherapy agents:  Herceptin  To help prevent nausea and vomiting after your treatment, we encourage you to take your nausea medication as prescribed.   If you develop nausea and vomiting that is not controlled by your nausea medication, call the clinic.   BELOW ARE SYMPTOMS THAT SHOULD BE REPORTED IMMEDIATELY:  *FEVER GREATER THAN 100.5 F  *CHILLS WITH OR WITHOUT FEVER  NAUSEA AND VOMITING THAT IS NOT CONTROLLED WITH YOUR NAUSEA MEDICATION  *UNUSUAL SHORTNESS OF BREATH  *UNUSUAL BRUISING OR BLEEDING  TENDERNESS IN MOUTH AND THROAT WITH OR WITHOUT PRESENCE OF ULCERS  *URINARY PROBLEMS  *BOWEL PROBLEMS  UNUSUAL RASH Items with * indicate a potential emergency and should be followed up as soon as possible.  Feel free to call the clinic you have any questions or concerns. The clinic phone number is [phone].  Please show the CHEMO ALERT CARD at check-in to the Emergency Department and triage nurse.   

## 2017-03-11 ENCOUNTER — Other Ambulatory Visit: Payer: Self-pay | Admitting: Hematology

## 2017-03-11 DIAGNOSIS — E2839 Other primary ovarian failure: Secondary | ICD-10-CM

## 2017-03-30 ENCOUNTER — Ambulatory Visit: Payer: Medicare PPO

## 2017-03-30 ENCOUNTER — Other Ambulatory Visit: Payer: Medicare PPO

## 2017-03-30 NOTE — Progress Notes (Signed)
[place]  [phone] [phone]  Clinic Follow Up Note   Patient Care Team: [name], NP as PCP - General (Internal Medicine) [name], MD as Consulting Physician (General Surgery) 03/31/2017   CHIEF COMPLAINTS:  Follow up recurrent left breast cancer   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 [facility])   Staging form: Breast, AJCC 7th Edition     Clinical stage from 04/17/2016: Stage IA (T1c, N0, M0) - Signed by [name], MD on 05/07/2016     Pathologic stage from 05/28/2016: Stage IIA (T2, N0, cM0) - Signed by [name], MD on 06/11/2016 History of left breast cancer   Staging form: Breast, AJCC 7th Edition     Clinical: Stage IIIB (T4d, N1, cM0) - Signed by [name], MD on 12/14/2013     Pathologic: Stage IIIB (T4d, N1a, cM0) - Signed by [name], MD on 12/14/2013       History of left breast cancer   09/12/2002 Imaging    CT scan show no evidence of disease apart from the left axilla      09/2002 -  Neo-Adjuvant Chemotherapy    TAC X4 cycles       01/10/2003 Surgery    The patient had left lumpectomy and axillary lymph node dissection which show residual breast cancer and a sentinel lymph node was positive      2004 -  Adjuvant Chemotherapy    Cytoxan with 5-FU x4 cycles (methotrexate added at cycle 4).      2004 -  Radiation Therapy    Adjuvant left breast radiation      12/2002 Receptors her2    ER, PR and HER-2 were all negative.       Cancer of central portion of left breast ([place])   04/13/2016 Mammogram    Scheduler coarse heterogeneous calcifications spanning an area of 3.7 cm in the lumpectomy site, indeterminate. Ultrasound of the left axilla was negative.      04/17/2016 Initial Diagnosis    Cancer of central portion of left breast ([place])      04/17/2016 Initial Biopsy    Left breast core needle biopsy showed invasive and in situ ductal carcinoma with calcification, grade 2.      04/17/2016 Receptors her2    Your 100% positive, PR 15% positive, strong staining, Ki-67 10%, HER-2 positive by IHC (3)      05/05/2016 Imaging    Bilateral breast MRI showed a 1.3 x 0.8 x 0.7 cm biopsy-proven ductal carcinoma in the region of the previous lumpectomy. Enlarged lobulated right inferior axillary lymph node with diffuse cortical thickening, biopsy is recommended.      05/15/2016 Pathology Results    right axilary node biopsy was negative for malignant cell       05/28/2016 Surgery    Simple left mastectomy      05/28/2016 Pathology Results    Left mastectomy showed invasive ductal carcinoma, grade 3, 3.6 cm, high grade DCIS, (+) LVI, surgical margins were negative. Tumor 3.6 cm, no lymph nodes identified.      06/25/2016 Imaging    Staging CT scan of the chest, abdomen and pelvis with contrast and a bone scan showed no evidence of metastasis. Postoperative fluid collection in the left breast, likely a seroma or hematoma. Right axillary adenopathy, which was biopsied previously.       07/07/2016 -  Chemotherapy    Adjuvant chemotherapy with docetaxel, carboplatin, Herceptin and pejeta  every 3 weeks, for 6 cycles, followed by Herceptin maintenance therapy for total of one year treatment      12/15/2016 -  Anti-estrogen oral therapy    Adjuvant letrozole 2.5 mg once daily         HISTORY OF PRESENTING ILLNESS: 05/08/16 Lindsey Marquez 66 y.o. female is here because of her recurrent left breast cancer.  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 She was seen by multiple medical oncologist in our practice in the past, last was seen by Dr. Alvy Bimler in 11/2013.  She has been doing well, and is compliant with annual screening mammogram. The screening mammogram on 04/17/2016 showed calcification in the left breast, and core needle biopsy showed  invasive and in situ ductal carcinoma, grade 2, ER/PR positive, HER-2 amplified. She was referred to seeing breast surgeon Dr. Georgette Dover and referred back to [place].   She feels well, she did not feel any lump in her breast or axillas latley. She denies any pain, dyspnea, or GI symptoms. She had right nipple rash and right axillary swelling in the past one week, after she drinks some diet tea with citric. Breast MRI showed an enlarged lobulated right inferior axillary lymph node with diffuse cortical thickening, in addition to the known left breast mass which measured 1.3 cm on MRI.  GYN HISTORY  Menarchal: 12 LMP: 2003 Contraceptive: 20 HRT: n/a  G2P2: 53 yo Son and 72 yo son who died   CURRENT THERAPY: maintenance Herceptin every 3 weeks, letrozole started on 12/15/2016  INTERIM HISTORY:  Lindsey Marquez returns for follow up and 7th cycle herceptin. Today she presents to the clinic reporting her hair is slowly growing back and nails are also growing but color is still dark. She is experiencing some tingles on hands and feet but have gotten better. Her feet are slightly swollen. She reports to be feeling better after finishing chemo. She does get tired when she gets really busy, her energy is about 70% back. She is doing fine on herceptin and letrezole with no trouble and no hot flashes. She gets some joint stiffness not everyday.      MEDICAL HISTORY:  Past Medical History:  Diagnosis Date  . Anxiety   . Breast cancer [facility]) August 2002   Invasive ductal carcinoma Left breast.  . Diabetes mellitus without complication ([place])   . Family history of malignant neoplasm of ovary   . Family history of pancreatic cancer   . Hypertension     SURGICAL HISTORY: Past Surgical History:  Procedure Laterality Date  . BREAST SURGERY Left 2003  . FOOT SURGERY  2000  . MASTECTOMY Left 05/28/2016  . port a cath insertion  05/28/2016  . PORTACATH PLACEMENT Right 05/28/2016   Procedure: INSERTION PORT-A-CATH;   Surgeon: [name], MD;  Location: [place];  Service: General;  Laterality: Right;  . TOTAL MASTECTOMY Left 05/28/2016   Procedure: LEFT  MASTECTOMY;  Surgeon: [name], MD;  Location: [place];  Service: General;  Laterality: Left;  . TUBAL LIGATION  22 yrs since  2004.   Bilateral.    SOCIAL HISTORY: Social History   Social History  . Marital status: Divorced    Spouse name: N/A  . Number of children: N/A  . Years of education: N/A   Occupational History  . Not on file.   Social History Main Topics  . Smoking status: Former  Smoker    Packs/day: 0.10    Years: 7.00    Quit date: 12/01/1975  . Smokeless tobacco: Never Used  . Alcohol use No  . Drug use: No  . Sexual activity: Yes   Other Topics Concern  . Not on file   Social History Narrative  . No narrative on file   She is retired from school   FAMILY HISTORY: Family History  Problem Relation Age of Onset  . Prostate cancer Father   . Ovarian cancer Maternal Aunt 31    deceased  . Pancreatic cancer Mother 106    deceased  . Cancer Maternal Aunt     2 other mat aunts; unk. primary in 6s; deceased  . Cancer Maternal Uncle     "bone ca"; unk. primary; deceased 12s  . Prostate cancer Maternal Uncle     deceased 47    ALLERGIES:  is allergic to codeine.  MEDICATIONS:  Current Outpatient Prescriptions  Medication Sig Dispense Refill  . aspirin 81 MG tablet Take 81 mg by mouth daily.     Marland Kitchen glucose blood (TRUE METRIX BLOOD GLUCOSE TEST) test strip 1 each by Other route 3 (three) times daily. Use as instructed 600 each 2  . glyBURIDE-metformin (GLUCOVANCE) 2.5-500 MG tablet Take 2 tablets by mouth 2 (two) times daily with a meal. 360 tablet 1  . ibuprofen (ADVIL,MOTRIN) 200 MG tablet Take 200 mg by mouth every 8 (eight) hours as needed.      Marland Kitchen letrozole (FEMARA) 2.5 MG tablet Take 1 tablet (2.5 mg total) by mouth daily. 30 tablet 3  . lidocaine-prilocaine (EMLA) cream Apply to affected area once 30 g 3  .  lisinopril-hydrochlorothiazide (PRINZIDE,ZESTORETIC) 20-25 MG tablet Take 1 tablet by mouth daily. 90 tablet 1  . Omega-3 Fatty Acids (FISH OIL) 1200 MG CAPS Take 1 capsule by mouth daily.    . ondansetron (ZOFRAN) 8 MG tablet Take 1 tablet (8 mg total) by mouth 2 (two) times daily as needed for refractory nausea / vomiting. Start on day 3 after chemo. 30 tablet 1  . Potassium Chloride ER 20 MEQ TBCR Take 40 mEq by mouth daily. 60 tablet 1  . prochlorperazine (COMPAZINE) 10 MG tablet Take 1 tablet (10 mg total) by mouth every 6 (six) hours as needed (Nausea or vomiting). 30 tablet 1  . torsemide (DEMADEX) 20 MG tablet Take 20 mg by mouth daily.    . traMADol (ULTRAM) 50 MG tablet Take 2 tablets (100 mg total) by mouth every 6 (six) hours as needed for moderate pain. 20 tablet 0  . TRUEPLUS LANCETS 33G MISC 1 each by Does not apply route 3 (three) times daily. 600 each 2   No current facility-administered medications for this visit.    Facility-Administered Medications Ordered in Other Visits  Medication Dose Route Frequency Provider Last Rate Last Dose  . sodium chloride 0.9 % injection 10 mL  10 mL Intravenous PRN [name], MD   10 mL at 02/16/17 1004    REVIEW OF SYSTEMS:   Constitutional: Denies fevers, chills or abnormal night sweats (+) fatigue (+) loss of appetite.  Eyes: Denies blurriness of vision, double vision or watery eyes Ears, nose, mouth, throat, and face: Denies mucositis or sore throat Respiratory: Denies cough, dyspnea or wheezes Cardiovascular: Denies palpitation, chest discomfort (+) bilateral feet swelling Gastrointestinal:  Denies nausea, heartburn or change in bowel habits Skin: Denies abnormal skin rashes (+) hair and nail are slowly growing back since completing chemotherapy  Lymphatics: Denies new lymphadenopathy or easy bruising Neurological:Denies numbness (+) Tingling in hands and feet have gotten better MSK: (+) slight joint pain Behavioral/Psych: Mood is  stable, no new changes  All other systems were reviewed with the patient and are negative.  PHYSICAL EXAMINATION: ECOG PERFORMANCE STATUS: 1  Vitals:   03/31/17 1027  BP: 128/61  Pulse: 93  Resp: 17  Temp: 98.9 F (37.2 C)   Filed Weights   03/31/17 1027  Weight: 233 lb (105.7 kg)    GENERAL:alert, no distress and comfortable SKIN: skin color, texture, turgor are normal, no rashes or significant lesions EYES: normal, conjunctiva are pink and non-injected, sclera clear OROPHARYNX:no exudate, no erythema and lips, buccal mucosa, and tongue normal  NECK: supple, thyroid normal size, non-tender, without nodularity LYMPH:  no palpable lymphadenopathy in the cervical, axillary or inguinal LUNGS: clear to auscultation and percussion with normal breathing effort HEART: regular rate & rhythm and no murmurs (+) bilateral feet swelling ABDOMEN:abdomen soft, non-tender and normal bowel sounds Musculoskeletal:no cyanosis of digits and no clubbing  PSYCH: alert & oriented x 3 with fluent speech NEURO: no focal motor/sensory deficits, (+) decreased vibration sensation on both hands, no weakness. Breasts: Breast inspection showed left breast is surgically absent, surgical incision has healed well. Exam of the right breast and bilateral axillas reviewed no palpable mass or adenopathy.  LABORATORY DATA:  I have reviewed the data as listed CBC Latest Ref Rng & Units 03/31/2017 03/09/2017 02/16/2017  WBC 3.9 - 10.3 10e3/uL 7.4 7.4 7.4  Hemoglobin 11.6 - 15.9 g/dL 9.9(L) 9.8(L) 8.9(L)  Hematocrit 34.8 - 46.6 % 29.3(L) 28.8(L) 26.6(L)  Platelets 145 - 400 10e3/uL 308 309 257   CMP Latest Ref Rng & Units 03/31/2017 03/09/2017 02/16/2017  Glucose 70 - 140 mg/dl 111 123 125  BUN 7.0 - 26.0 mg/dL 43.3(H) 42.0(H) 39.4(H)  Creatinine 0.6 - 1.1 mg/dL 1.2(H) 1.5(H) 1.2(H)  Sodium 136 - 145 mEq/L 141 141 140  Potassium 3.5 - 5.1 mEq/L 4.3 4.4 4.1  Chloride 96 - 112 mEq/L - - -  CO2 22 - 29 mEq/L 23 23 24     Calcium 8.4 - 10.4 mg/dL 9.6 10.7(H) 9.6  Total Protein 6.4 - 8.3 g/dL 8.0 8.5(H) 7.9  Total Bilirubin 0.20 - 1.20 mg/dL 0.39 0.38 0.35  Alkaline Phos 40 - 150 U/L 70 66 66  AST 5 - 34 U/L 30 34 22  ALT 0 - 55 U/L 31 31 24     PATHOLOGY REPORT  Diagnosis 04/17/2016 Breast, left, needle core biopsy, inner mid breast - INVASIVE DUCTAL CARCINOMA WITH CALCIFICATIONS, SEE COMMENT. - DUCTAL CARCINOMA IN SITU WITH COMEDONECROSIS. Microscopic Comment While grading is best performed on the resection specimen the invasive carcinoma appears grade 2. Prognostic markers will be ordered and reported in an addendum. Dr. Lyndon Code has reviewed the case. The case was called to [facility] on 04/20/2016.  Results: HER2 - *EQUIVOCAL* (Her2 by IHC will be ordered.) RATIO OF HER2/CEP17 SIGNALS 1.73 AVERAGE HER2 COPY NUMBER PER CELL 4.15 By immunohistochemistry, the tumor cells are positive for Her2 (3).  Results: IMMUNOHISTOCHEMICAL AND MORPHOMETRIC ANALYSIS PERFORMED MANUALLY Estrogen Receptor: 100%, POSITIVE, STRONG STAINING INTENSITY Progesterone Receptor: 15%, POSITIVE, STRONG STAINING INTENSITY Proliferation Marker Ki67: 10%  Diagnosis 05/15/2016 Lymph node, needle/core biopsy, right axilla - BENIGN REACTIVE LYMPH NODE. - NO GRANULOMAS OR MALIGNANCY IDENTIFIED. - SEE COMMENT. Microscopic Comment Internal departmental review obtained (Dr. Lyndon Code) with agreement. Results are phoned to [facility] (  05/18/16). (MEG:gt, 05/18/16)  Breast, simple mastectomy, Left 05/28/2016 - INVASIVE DUCTAL CARCINOMA, GRADE III/III, SPANNING 3.6 CM. - DUCTAL CARCINOMA IN SITU, HIGH GRADE. - LYMPHOVASCULAR INVASION IS IDENTIFIED. - THE SURGICAL RESECTION MARGINS ARE NEGATIVE FOR CARCINOMA. - SEE ONCOLOGY TABLE BELOW. Microscopic Comment BREAST, INVASIVE TUMOR, WITHOUT LYMPH NODES PRESENT Specimen, including laterality : Left breast Procedure: Simple mastectomy Histologic  type: Ductal with micropapillary features Grade: III Tubule formation: 3 Nuclear pleomorphism: 2 Mitotic: 3 Tumor size (gross measurement): 3.6 cm Margins: Greater than 0.2 cm to all margins Lymphovascular invasion: Present Ductal carcinoma in situ: Present Grade: High grade Extensive intraductal component: Yes Lobular neoplasia: Not identified Tumor focality: Unifocal Treatment effect: N/A Extent of tumor: Confined to breast parenchyma. Breast prognostic profile: [phone] Estrogen receptor: 100%, strong staining intensity Progesterone receptor: 15%, strong staining intensity Her 2 neu: Amplification was detected by immunohistochemistry. Ki-67: 10% 1 of 2 FINAL for Lindsey Marquez, Lindsey Marquez ([id number]) Microscopic Comment(continued) Non-neoplastic breast: No significant findings. TNM: pT2, pNX (clinically recurrent). Comments: In addition to the main tumor, there is ductal carcinoma in situ present in random tissue submitted from the inferior lateral quadrant. (JBK:kh 06-01-16)  RADIOGRAPHIC STUDIES: I have personally reviewed the radiological images as listed and agreed with the findings in the report. No results found.   DG Chest 2 View 10/28/2016 IMPRESSION: No acute cardiopulmonary abnormality.  ASSESSMENT & PLAN:  66 y.o. 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  1. Cancer of central portion of left breast, pT2NxM0, stage IIA, G3, triple positive -I previously reviewed her surgical pathology findings in great details with patient and her family member. It showed a 3.6 cm mass, grade 3, surgical margins were negative. She had no sentinel lymph node biopsy due to her prior history of axillary lymph node dissection. -Her restaging CT scan of bone scan showed no evidence of distant metastasis, I reviewed with patient. -We again discussed the risk of  cancer recurrence of this complete surgical resection. We reviewed that HER-2 positive with cancers are more aggressive, with increased risk of metastasis.  -she has completed adjuvant TCHP (docetaxel, carbotaxol, Herceptin, pejeta), every 3 weeks for total of 6 cycles, now on maintenance Herceptin every 3 weeks to complete 1 year treatment. -She is overdue for a repeat mammogram in May 2018.  -She is overdue for Echo, I sent a message to Dr. Clayborne Dana office to schedule it -Will need bone density scan to monitor while on Letrozole, will order scan in May  2. Anemia  -Secondary to chemotherapy, her hemoglobin is 9.6, she has mild symptoms from anemia -She felt better after last blood transfusion.  -Check iron next visit. If it is low, she will start taking an iron pill.For now, just take a multivitamin.   3. DM and HTN  -She'll continue follow-up with her primary care physician -We again discussed her blood pressure and glucose level may be impacted by chemotherapy, and dexamethasone she takes before and after chemotherapy. -previously I strongly encouraged her to monitor her blood pressure and glucose at home, and consider increasing the dose of metformin and glyburide if needed -her BP and blood glucose has been normal/well controlled lately  4. Obesity  -I previously encouraged her to eat healthy and exercise regularly, she is willing to lose some weight after she completes chemotherapy.  5. Peripheral neuropathy, G1 -Secondary to chemotherapy, especially docetaxel -I previously encouraged her to take vitamin B complex. She  does not feel she needs medication for now.  6. Fatigue  -Probably related to her previous chemotherapy and anemia -I previously encouraged her to gradually increase her exercise   7. Bone Health -We previously discussed that Letrozole can cause some bone weakening  -She previously has had a bone density scan in the past, but it was several years ago -I have  previously ordered a bone density scan  Plan -reviewed labs and they are adequate for Herceptin treatment today and will continue every 3 weeks  -Lab, flush, Herceptin in 3, 6, 9 weeks  -f/u in 9 weeks -repeat echo in June   All questions were answered. The patient knows to call the clinic with any problems, questions or concerns.  I spent 20 minutes counseling the patient face to face. The total time spent in the appointment was 25 minutes and more than 50% was on counseling.  This document serves as a record of services personally performed by [name], MD. It was created on her behalf by Joslyn Devon, a trained medical scribe. The creation of this record is based on the scribe's personal observations and the provider's statements to them. This document has been checked and approved by the attending provider.    I have reviewed the above documentation for accuracy and completeness and I agree with the above.   [name], MD 03/31/2017

## 2017-03-31 ENCOUNTER — Ambulatory Visit (HOSPITAL_BASED_OUTPATIENT_CLINIC_OR_DEPARTMENT_OTHER): Payer: Medicare PPO

## 2017-03-31 ENCOUNTER — Other Ambulatory Visit (HOSPITAL_BASED_OUTPATIENT_CLINIC_OR_DEPARTMENT_OTHER): Payer: Medicare PPO

## 2017-03-31 ENCOUNTER — Telehealth: Payer: Self-pay | Admitting: Hematology

## 2017-03-31 ENCOUNTER — Ambulatory Visit (HOSPITAL_BASED_OUTPATIENT_CLINIC_OR_DEPARTMENT_OTHER): Payer: Medicare PPO | Admitting: Hematology

## 2017-03-31 ENCOUNTER — Ambulatory Visit: Payer: Medicare PPO

## 2017-03-31 VITALS — BP 128/61 | HR 93 | Temp 98.9°F | Resp 17 | Ht 69.0 in | Wt 233.0 lb

## 2017-03-31 DIAGNOSIS — E2839 Other primary ovarian failure: Secondary | ICD-10-CM

## 2017-03-31 DIAGNOSIS — Z5112 Encounter for antineoplastic immunotherapy: Secondary | ICD-10-CM

## 2017-03-31 DIAGNOSIS — C50112 Malignant neoplasm of central portion of left female breast: Secondary | ICD-10-CM

## 2017-03-31 DIAGNOSIS — D649 Anemia, unspecified: Secondary | ICD-10-CM

## 2017-03-31 DIAGNOSIS — Z853 Personal history of malignant neoplasm of breast: Secondary | ICD-10-CM

## 2017-03-31 DIAGNOSIS — E119 Type 2 diabetes mellitus without complications: Secondary | ICD-10-CM | POA: Diagnosis not present

## 2017-03-31 DIAGNOSIS — Z95828 Presence of other vascular implants and grafts: Secondary | ICD-10-CM

## 2017-03-31 LAB — CBC WITH DIFFERENTIAL/PLATELET
BASO%: 0.4 % (ref 0.0–2.0)
Basophils Absolute: 0 10*3/uL (ref 0.0–0.1)
EOS%: 2.5 % (ref 0.0–7.0)
Eosinophils Absolute: 0.2 10*3/uL (ref 0.0–0.5)
HCT: 29.3 % — ABNORMAL LOW (ref 34.8–46.6)
HGB: 9.9 g/dL — ABNORMAL LOW (ref 11.6–15.9)
LYMPH%: 29.8 % (ref 14.0–49.7)
MCH: 35.3 pg — ABNORMAL HIGH (ref 25.1–34.0)
MCHC: 33.8 g/dL (ref 31.5–36.0)
MCV: 104.5 fL — ABNORMAL HIGH (ref 79.5–101.0)
MONO#: 0.6 10*3/uL (ref 0.1–0.9)
MONO%: 7.9 % (ref 0.0–14.0)
NEUT#: 4.4 10*3/uL (ref 1.5–6.5)
NEUT%: 59.4 % (ref 38.4–76.8)
Platelets: 308 10*3/uL (ref 145–400)
RBC: 2.8 10*6/uL — ABNORMAL LOW (ref 3.70–5.45)
RDW: 13.3 % (ref 11.2–14.5)
WBC: 7.4 10*3/uL (ref 3.9–10.3)
lymph#: 2.2 10*3/uL (ref 0.9–3.3)

## 2017-03-31 LAB — COMPREHENSIVE METABOLIC PANEL
ALT: 31 U/L (ref 0–55)
AST: 30 U/L (ref 5–34)
Albumin: 4 g/dL (ref 3.5–5.0)
Alkaline Phosphatase: 70 U/L (ref 40–150)
Anion Gap: 12 mEq/L — ABNORMAL HIGH (ref 3–11)
BUN: 43.3 mg/dL — ABNORMAL HIGH (ref 7.0–26.0)
CO2: 23 mEq/L (ref 22–29)
Calcium: 9.6 mg/dL (ref 8.4–10.4)
Chloride: 105 mEq/L (ref 98–109)
Creatinine: 1.2 mg/dL — ABNORMAL HIGH (ref 0.6–1.1)
EGFR: 55 mL/min/{1.73_m2} — ABNORMAL LOW (ref >90)
Glucose: 111 mg/dl (ref 70–140)
Potassium: 4.3 mEq/L (ref 3.5–5.1)
Sodium: 141 mEq/L (ref 136–145)
Total Bilirubin: 0.39 mg/dL (ref 0.20–1.20)
Total Protein: 8 g/dL (ref 6.4–8.3)

## 2017-03-31 MED ORDER — SODIUM CHLORIDE 0.9 % IJ SOLN
10.0000 mL | INTRAMUSCULAR | Status: DC | PRN
  Administered 2017-03-31: 10 mL via INTRAVENOUS
  Filled 2017-03-31: qty 10

## 2017-03-31 MED ORDER — SODIUM CHLORIDE 0.9 % IV SOLN
Freq: Once | INTRAVENOUS | Status: AC
  Administered 2017-03-31: 11:00:00 via INTRAVENOUS

## 2017-03-31 MED ORDER — SODIUM CHLORIDE 0.9% FLUSH
10.0000 mL | INTRAVENOUS | Status: DC | PRN
  Administered 2017-03-31: 10 mL
  Filled 2017-03-31: qty 10

## 2017-03-31 MED ORDER — DIPHENHYDRAMINE HCL 25 MG PO CAPS
ORAL_CAPSULE | ORAL | Status: AC
  Filled 2017-03-31: qty 1

## 2017-03-31 MED ORDER — TRASTUZUMAB CHEMO 150 MG IV SOLR
6.0000 mg/kg | Freq: Once | INTRAVENOUS | Status: AC
  Administered 2017-03-31: 651 mg via INTRAVENOUS
  Filled 2017-03-31: qty 31

## 2017-03-31 MED ORDER — ACETAMINOPHEN 325 MG PO TABS
ORAL_TABLET | ORAL | Status: AC
  Filled 2017-03-31: qty 2

## 2017-03-31 MED ORDER — HEPARIN SOD (PORK) LOCK FLUSH 100 UNIT/ML IV SOLN
500.0000 [IU] | Freq: Once | INTRAVENOUS | Status: AC | PRN
Start: 2017-03-31 — End: 2017-03-31
  Administered 2017-03-31: 500 [IU]
  Filled 2017-03-31: qty 5

## 2017-03-31 MED ORDER — ACETAMINOPHEN 325 MG PO TABS
650.0000 mg | ORAL_TABLET | Freq: Once | ORAL | Status: AC
  Administered 2017-03-31: 650 mg via ORAL

## 2017-03-31 MED ORDER — DIPHENHYDRAMINE HCL 25 MG PO CAPS
50.0000 mg | ORAL_CAPSULE | Freq: Once | ORAL | Status: AC
  Administered 2017-03-31: 25 mg via ORAL

## 2017-03-31 NOTE — Patient Instructions (Signed)
[facility] Discharge Instructions for Patients Receiving Chemotherapy  Today you received the following chemotherapy agents:  Herceptin  To help prevent nausea and vomiting after your treatment, we encourage you to take your nausea medication as prescribed.   If you develop nausea and vomiting that is not controlled by your nausea medication, call the clinic.   BELOW ARE SYMPTOMS THAT SHOULD BE REPORTED IMMEDIATELY:  *FEVER GREATER THAN 100.5 F  *CHILLS WITH OR WITHOUT FEVER  NAUSEA AND VOMITING THAT IS NOT CONTROLLED WITH YOUR NAUSEA MEDICATION  *UNUSUAL SHORTNESS OF BREATH  *UNUSUAL BRUISING OR BLEEDING  TENDERNESS IN MOUTH AND THROAT WITH OR WITHOUT PRESENCE OF ULCERS  *URINARY PROBLEMS  *BOWEL PROBLEMS  UNUSUAL RASH Items with * indicate a potential emergency and should be followed up as soon as possible.  Feel free to call the clinic you have any questions or concerns. The clinic phone number is [phone].  Please show the CHEMO ALERT CARD at check-in to the Emergency Department and triage nurse.   

## 2017-03-31 NOTE — Telephone Encounter (Signed)
Gave patient AVS and calender per 5/2 los. Will call for ECHO when order is placed , message sent to Dr. Burr Medico. GI - breast center to contact patient with Mammo and DEXA

## 2017-03-31 NOTE — Telephone Encounter (Signed)
Called ECHO - Sonia Side in ECHO to contact patient and will contact and schedule appt with patient

## 2017-04-02 ENCOUNTER — Encounter: Payer: Self-pay | Admitting: Hematology

## 2017-04-08 ENCOUNTER — Other Ambulatory Visit: Payer: Self-pay | Admitting: *Deleted

## 2017-04-08 MED ORDER — POTASSIUM CHLORIDE ER 20 MEQ PO TBCR: 40.0000 meq | EXTENDED_RELEASE_TABLET | Freq: Every day | ORAL | 1 refills | Status: DC

## 2017-04-16 ENCOUNTER — Ambulatory Visit (HOSPITAL_COMMUNITY)
Admission: RE | Admit: 2017-04-16 | Discharge: 2017-04-16 | Disposition: A | Discharge status: Home / Self Care | Payer: Medicare PPO | Source: Ambulatory Visit | Attending: Hematology | Admitting: Hematology

## 2017-04-16 DIAGNOSIS — I10 Essential (primary) hypertension: Secondary | ICD-10-CM | POA: Insufficient documentation

## 2017-04-16 DIAGNOSIS — E119 Type 2 diabetes mellitus without complications: Secondary | ICD-10-CM | POA: Diagnosis not present

## 2017-04-16 DIAGNOSIS — C50112 Malignant neoplasm of central portion of left female breast: Secondary | ICD-10-CM | POA: Diagnosis not present

## 2017-04-16 DIAGNOSIS — I348 Other nonrheumatic mitral valve disorders: Secondary | ICD-10-CM | POA: Insufficient documentation

## 2017-04-21 ENCOUNTER — Ambulatory Visit (HOSPITAL_BASED_OUTPATIENT_CLINIC_OR_DEPARTMENT_OTHER): Payer: Medicare PPO

## 2017-04-21 ENCOUNTER — Other Ambulatory Visit: Payer: Medicare PPO

## 2017-04-21 ENCOUNTER — Other Ambulatory Visit (HOSPITAL_BASED_OUTPATIENT_CLINIC_OR_DEPARTMENT_OTHER): Payer: Medicare PPO

## 2017-04-21 VITALS — BP 136/63 | HR 93 | Temp 98.6°F | Resp 16

## 2017-04-21 DIAGNOSIS — Z5112 Encounter for antineoplastic immunotherapy: Secondary | ICD-10-CM

## 2017-04-21 DIAGNOSIS — D6481 Anemia due to antineoplastic chemotherapy: Secondary | ICD-10-CM

## 2017-04-21 DIAGNOSIS — T451X5A Adverse effect of antineoplastic and immunosuppressive drugs, initial encounter: Secondary | ICD-10-CM

## 2017-04-21 DIAGNOSIS — C50112 Malignant neoplasm of central portion of left female breast: Secondary | ICD-10-CM

## 2017-04-21 LAB — COMPREHENSIVE METABOLIC PANEL
ALT: 31 U/L (ref 0–55)
AST: 30 U/L (ref 5–34)
Albumin: 4.1 g/dL (ref 3.5–5.0)
Alkaline Phosphatase: 68 U/L (ref 40–150)
Anion Gap: 12 mEq/L — ABNORMAL HIGH (ref 3–11)
BUN: 34.6 mg/dL — ABNORMAL HIGH (ref 7.0–26.0)
CO2: 23 mEq/L (ref 22–29)
Calcium: 9.6 mg/dL (ref 8.4–10.4)
Chloride: 104 mEq/L (ref 98–109)
Creatinine: 1.1 mg/dL (ref 0.6–1.1)
EGFR: 61 mL/min/{1.73_m2} — ABNORMAL LOW (ref >90)
Glucose: 95 mg/dl (ref 70–140)
Potassium: 4.1 mEq/L (ref 3.5–5.1)
Sodium: 139 mEq/L (ref 136–145)
Total Bilirubin: 0.38 mg/dL (ref 0.20–1.20)
Total Protein: 8 g/dL (ref 6.4–8.3)

## 2017-04-21 LAB — CBC WITH DIFFERENTIAL/PLATELET
BASO%: 0.3 % (ref 0.0–2.0)
Basophils Absolute: 0 10*3/uL (ref 0.0–0.1)
EOS%: 1.9 % (ref 0.0–7.0)
Eosinophils Absolute: 0.2 10*3/uL (ref 0.0–0.5)
HCT: 29.7 % — ABNORMAL LOW (ref 34.8–46.6)
HGB: 9.9 g/dL — ABNORMAL LOW (ref 11.6–15.9)
LYMPH%: 32.6 % (ref 14.0–49.7)
MCH: 34.3 pg — ABNORMAL HIGH (ref 25.1–34.0)
MCHC: 33.3 g/dL (ref 31.5–36.0)
MCV: 102.8 fL — ABNORMAL HIGH (ref 79.5–101.0)
MONO#: 0.5 10*3/uL (ref 0.1–0.9)
MONO%: 6 % (ref 0.0–14.0)
NEUT#: 4.6 10*3/uL (ref 1.5–6.5)
NEUT%: 59.2 % (ref 38.4–76.8)
Platelets: 256 10*3/uL (ref 145–400)
RBC: 2.89 10*6/uL — ABNORMAL LOW (ref 3.70–5.45)
RDW: 12.8 % (ref 11.2–14.5)
WBC: 7.7 10*3/uL (ref 3.9–10.3)
lymph#: 2.5 10*3/uL (ref 0.9–3.3)
nRBC: 0 % (ref 0–0)

## 2017-04-21 MED ORDER — DIPHENHYDRAMINE HCL 25 MG PO CAPS
50.0000 mg | ORAL_CAPSULE | Freq: Once | ORAL | Status: AC
  Administered 2017-04-21: 25 mg via ORAL

## 2017-04-21 MED ORDER — ACETAMINOPHEN 325 MG PO TABS
ORAL_TABLET | ORAL | Status: AC
Start: 2017-04-21 — End: 2017-04-21
  Filled 2017-04-21: qty 2

## 2017-04-21 MED ORDER — ACETAMINOPHEN 325 MG PO TABS
650.0000 mg | ORAL_TABLET | Freq: Once | ORAL | Status: AC
  Administered 2017-04-21: 650 mg via ORAL

## 2017-04-21 MED ORDER — HEPARIN SOD (PORK) LOCK FLUSH 100 UNIT/ML IV SOLN
500.0000 [IU] | Freq: Once | INTRAVENOUS | Status: AC | PRN
  Administered 2017-04-21: 500 [IU]
  Filled 2017-04-21: qty 5

## 2017-04-21 MED ORDER — SODIUM CHLORIDE 0.9 % IV SOLN
Freq: Once | INTRAVENOUS | Status: AC
  Administered 2017-04-21: 10:00:00 via INTRAVENOUS

## 2017-04-21 MED ORDER — TRASTUZUMAB CHEMO 150 MG IV SOLR
6.0000 mg/kg | Freq: Once | INTRAVENOUS | Status: AC
  Administered 2017-04-21: 651 mg via INTRAVENOUS
  Filled 2017-04-21: qty 31

## 2017-04-21 MED ORDER — DIPHENHYDRAMINE HCL 25 MG PO CAPS
ORAL_CAPSULE | ORAL | Status: AC
  Filled 2017-04-21: qty 2

## 2017-04-21 MED ORDER — SODIUM CHLORIDE 0.9% FLUSH
10.0000 mL | INTRAVENOUS | Status: DC | PRN
  Administered 2017-04-21: 10 mL
  Filled 2017-04-21: qty 10

## 2017-04-21 NOTE — Patient Instructions (Signed)
[facility] Discharge Instructions for Patients Receiving Chemotherapy  Today you received the following chemotherapy agents:  Herceptin  To help prevent nausea and vomiting after your treatment, we encourage you to take your nausea medication as prescribed.   If you develop nausea and vomiting that is not controlled by your nausea medication, call the clinic.   BELOW ARE SYMPTOMS THAT SHOULD BE REPORTED IMMEDIATELY:  *FEVER GREATER THAN 100.5 F  *CHILLS WITH OR WITHOUT FEVER  NAUSEA AND VOMITING THAT IS NOT CONTROLLED WITH YOUR NAUSEA MEDICATION  *UNUSUAL SHORTNESS OF BREATH  *UNUSUAL BRUISING OR BLEEDING  TENDERNESS IN MOUTH AND THROAT WITH OR WITHOUT PRESENCE OF ULCERS  *URINARY PROBLEMS  *BOWEL PROBLEMS  UNUSUAL RASH Items with * indicate a potential emergency and should be followed up as soon as possible.  Feel free to call the clinic you have any questions or concerns. The clinic phone number is [phone].  Please show the CHEMO ALERT CARD at check-in to the Emergency Department and triage nurse.   

## 2017-04-22 ENCOUNTER — Other Ambulatory Visit: Payer: Self-pay | Admitting: Internal Medicine

## 2017-04-27 ENCOUNTER — Other Ambulatory Visit: Payer: Self-pay | Admitting: *Deleted

## 2017-04-27 MED ORDER — LETROZOLE 2.5 MG PO TABS: 2.5000 mg | ORAL_TABLET | Freq: Every day | ORAL | 3 refills | Status: DC

## 2017-04-29 ENCOUNTER — Other Ambulatory Visit: Payer: Self-pay | Admitting: Internal Medicine

## 2017-04-29 ENCOUNTER — Ambulatory Visit
Admission: RE | Admit: 2017-04-29 | Discharge: 2017-04-29 | Disposition: A | Discharge status: Home / Self Care | Payer: Medicare PPO | Source: Ambulatory Visit | Attending: Hematology | Admitting: Hematology

## 2017-04-29 DIAGNOSIS — Z1231 Encounter for screening mammogram for malignant neoplasm of breast: Secondary | ICD-10-CM

## 2017-04-29 DIAGNOSIS — R609 Edema, unspecified: Secondary | ICD-10-CM

## 2017-04-29 DIAGNOSIS — E2839 Other primary ovarian failure: Secondary | ICD-10-CM

## 2017-04-29 DIAGNOSIS — Z9012 Acquired absence of left breast and nipple: Secondary | ICD-10-CM

## 2017-05-12 ENCOUNTER — Ambulatory Visit (HOSPITAL_BASED_OUTPATIENT_CLINIC_OR_DEPARTMENT_OTHER): Payer: Medicare PPO

## 2017-05-12 ENCOUNTER — Other Ambulatory Visit (HOSPITAL_BASED_OUTPATIENT_CLINIC_OR_DEPARTMENT_OTHER): Payer: Medicare PPO

## 2017-05-12 VITALS — BP 131/61 | HR 91 | Temp 98.0°F | Resp 17

## 2017-05-12 DIAGNOSIS — Z5112 Encounter for antineoplastic immunotherapy: Secondary | ICD-10-CM

## 2017-05-12 DIAGNOSIS — C50112 Malignant neoplasm of central portion of left female breast: Secondary | ICD-10-CM | POA: Diagnosis not present

## 2017-05-12 LAB — COMPREHENSIVE METABOLIC PANEL
ALT: 31 U/L (ref 0–55)
AST: 28 U/L (ref 5–34)
Albumin: 4 g/dL (ref 3.5–5.0)
Alkaline Phosphatase: 72 U/L (ref 40–150)
Anion Gap: 13 mEq/L — ABNORMAL HIGH (ref 3–11)
BUN: 37.6 mg/dL — ABNORMAL HIGH (ref 7.0–26.0)
CO2: 24 mEq/L (ref 22–29)
Calcium: 9.9 mg/dL (ref 8.4–10.4)
Chloride: 105 mEq/L (ref 98–109)
Creatinine: 1.3 mg/dL — ABNORMAL HIGH (ref 0.6–1.1)
EGFR: 49 mL/min/{1.73_m2} — ABNORMAL LOW (ref >90)
Glucose: 144 mg/dl — ABNORMAL HIGH (ref 70–140)
Potassium: 4.9 mEq/L (ref 3.5–5.1)
Sodium: 142 mEq/L (ref 136–145)
Total Bilirubin: 0.5 mg/dL (ref 0.20–1.20)
Total Protein: 8 g/dL (ref 6.4–8.3)

## 2017-05-12 LAB — CBC WITH DIFFERENTIAL/PLATELET
BASO%: 0.4 % (ref 0.0–2.0)
Basophils Absolute: 0 10*3/uL (ref 0.0–0.1)
EOS%: 2 % (ref 0.0–7.0)
Eosinophils Absolute: 0.2 10*3/uL (ref 0.0–0.5)
HCT: 30.2 % — ABNORMAL LOW (ref 34.8–46.6)
HGB: 10.3 g/dL — ABNORMAL LOW (ref 11.6–15.9)
LYMPH%: 25.8 % (ref 14.0–49.7)
MCH: 35.1 pg — ABNORMAL HIGH (ref 25.1–34.0)
MCHC: 34 g/dL (ref 31.5–36.0)
MCV: 103.1 fL — ABNORMAL HIGH (ref 79.5–101.0)
MONO#: 0.8 10*3/uL (ref 0.1–0.9)
MONO%: 8.7 % (ref 0.0–14.0)
NEUT#: 5.5 10*3/uL (ref 1.5–6.5)
NEUT%: 63.1 % (ref 38.4–76.8)
Platelets: 327 10*3/uL (ref 145–400)
RBC: 2.93 10*6/uL — ABNORMAL LOW (ref 3.70–5.45)
RDW: 13.3 % (ref 11.2–14.5)
WBC: 8.7 10*3/uL (ref 3.9–10.3)
lymph#: 2.2 10*3/uL (ref 0.9–3.3)

## 2017-05-12 MED ORDER — ACETAMINOPHEN 325 MG PO TABS
ORAL_TABLET | ORAL | Status: AC
  Filled 2017-05-12: qty 1

## 2017-05-12 MED ORDER — ACETAMINOPHEN 325 MG PO TABS
650.0000 mg | ORAL_TABLET | Freq: Once | ORAL | Status: AC
Start: 2017-05-12 — End: 2017-05-12
  Administered 2017-05-12: 650 mg via ORAL

## 2017-05-12 MED ORDER — DIPHENHYDRAMINE HCL 25 MG PO CAPS
ORAL_CAPSULE | ORAL | Status: AC
  Filled 2017-05-12: qty 2

## 2017-05-12 MED ORDER — ACETAMINOPHEN 325 MG PO TABS
ORAL_TABLET | ORAL | Status: AC
  Filled 2017-05-12: qty 2

## 2017-05-12 MED ORDER — TRASTUZUMAB CHEMO 150 MG IV SOLR
6.0000 mg/kg | Freq: Once | INTRAVENOUS | Status: AC
  Administered 2017-05-12: 651 mg via INTRAVENOUS
  Filled 2017-05-12: qty 31

## 2017-05-12 MED ORDER — DIPHENHYDRAMINE HCL 25 MG PO CAPS
50.0000 mg | ORAL_CAPSULE | Freq: Once | ORAL | Status: AC
  Administered 2017-05-12: 25 mg via ORAL

## 2017-05-12 MED ORDER — SODIUM CHLORIDE 0.9 % IV SOLN
Freq: Once | INTRAVENOUS | Status: AC
  Administered 2017-05-12: 09:00:00 via INTRAVENOUS

## 2017-05-12 MED ORDER — HEPARIN SOD (PORK) LOCK FLUSH 100 UNIT/ML IV SOLN
500.0000 [IU] | Freq: Once | INTRAVENOUS | Status: AC | PRN
  Administered 2017-05-12: 500 [IU]
  Filled 2017-05-12: qty 5

## 2017-05-12 MED ORDER — SODIUM CHLORIDE 0.9% FLUSH
10.0000 mL | INTRAVENOUS | Status: DC | PRN
  Administered 2017-05-12: 10 mL
  Filled 2017-05-12: qty 10

## 2017-05-12 NOTE — Patient Instructions (Signed)
[place] Discharge Instructions for Patients Receiving Chemotherapy  Today you received the following chemotherapy agents: Herceptin   To help prevent nausea and vomiting after your treatment, we encourage you to take your nausea medication as direcetd   If you develop nausea and vomiting that is not controlled by your nausea medication, call the clinic.   BELOW ARE SYMPTOMS THAT SHOULD BE REPORTED IMMEDIATELY:  *FEVER GREATER THAN 100.5 F  *CHILLS WITH OR WITHOUT FEVER  NAUSEA AND VOMITING THAT IS NOT CONTROLLED WITH YOUR NAUSEA MEDICATION  *UNUSUAL SHORTNESS OF BREATH  *UNUSUAL BRUISING OR BLEEDING  TENDERNESS IN MOUTH AND THROAT WITH OR WITHOUT PRESENCE OF ULCERS  *URINARY PROBLEMS  *BOWEL PROBLEMS  UNUSUAL RASH Items with * indicate a potential emergency and should be followed up as soon as possible.  Feel free to call the clinic you have any questions or concerns. The clinic phone number is [phone].  Please show the [facility] at check-in to the Emergency Department and triage nurse.

## 2017-05-15 IMAGING — CR DG CHEST 1V PORT
1 series · 1 of 1 positions shown · non-contrast
Comparison: 10/28/2010

CLINICAL DATA: Port-A-Cath placement

EXAM:
PORTABLE CHEST 1 VIEW

[AP]
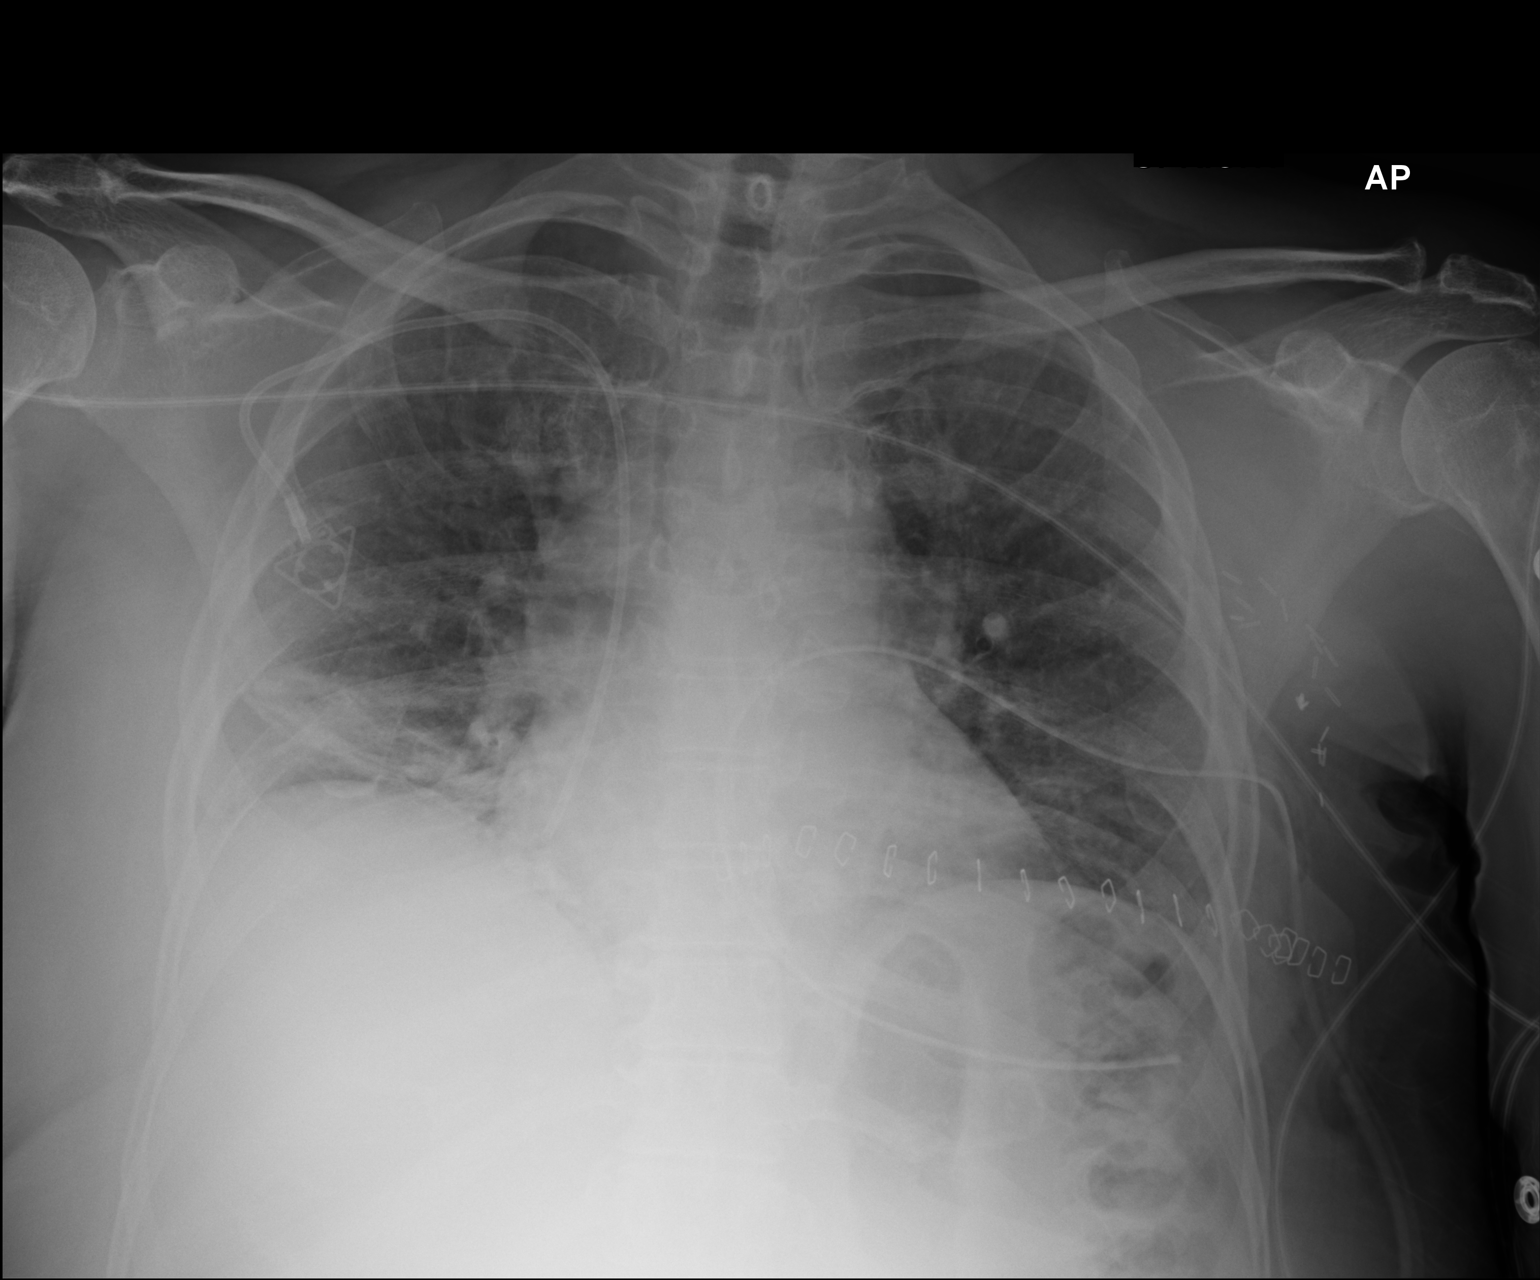

[1 of 1 positions shown; findings below may reference images not displayed]

FINDINGS: There is a right-sided Port-A-Cath with the tip projecting over the
right atrium.

There is a left basilar chest tube. There is no pneumothorax. There
is right basilar atelectasis. There is no pleural effusion. The
heart mediastinum are stable. There is evidence of prior left
axillary dissection.

The osseous structures are unremarkable.
IMPRESSION: 1. Right-sided Port-A-Cath with the tip projecting over the right
atrium.

## 2017-05-18 ENCOUNTER — Encounter: Payer: Self-pay | Admitting: Gastroenterology

## 2017-06-01 NOTE — Progress Notes (Signed)
[place]  [phone] [phone]  Clinic Follow Up Note   Patient Care Team: [name], NP as PCP - General (Internal Medicine) [name], MD as Consulting Physician (General Surgery) 06/03/2017   CHIEF COMPLAINTS:  Follow up recurrent left breast cancer   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 [facility])   Staging form: Breast, AJCC 7th Edition     Clinical stage from 04/17/2016: Stage IA (T1c, N0, M0) - Signed by [name], MD on 05/07/2016     Pathologic stage from 05/28/2016: Stage IIA (T2, N0, cM0) - Signed by [name], MD on 06/11/2016 History of left breast cancer   Staging form: Breast, AJCC 7th Edition     Clinical: Stage IIIB (T4d, N1, cM0) - Signed by [name], MD on 12/14/2013     Pathologic: Stage IIIB (T4d, N1a, cM0) - Signed by [name], MD on 12/14/2013       History of left breast cancer   09/12/2002 Imaging    CT scan show no evidence of disease apart from the left axilla      09/2002 -  Neo-Adjuvant Chemotherapy    TAC X4 cycles       01/10/2003 Surgery    The patient had left lumpectomy and axillary lymph node dissection which show residual breast cancer and a sentinel lymph node was positive      2004 -  Adjuvant Chemotherapy    Cytoxan with 5-FU x4 cycles (methotrexate added at cycle 4).      2004 -  Radiation Therapy    Adjuvant left breast radiation      12/2002 Receptors her2    ER, PR and HER-2 were all negative.       Cancer of central portion of left breast ([place])   04/13/2016 Mammogram    Scheduler coarse heterogeneous calcifications spanning an area of 3.7 cm in the lumpectomy site, indeterminate. Ultrasound of the left axilla was negative.      04/17/2016 Initial Diagnosis    Cancer of central portion of left breast ([place])      04/17/2016 Initial Biopsy    Left breast core needle biopsy showed invasive and in situ ductal carcinoma with calcification, grade 2.      04/17/2016 Receptors her2    Your 100% positive, PR 15% positive, strong staining, Ki-67 10%, HER-2 positive by IHC (3)      05/05/2016 Imaging    Bilateral breast MRI showed a 1.3 x 0.8 x 0.7 cm biopsy-proven ductal carcinoma in the region of the previous lumpectomy. Enlarged lobulated right inferior axillary lymph node with diffuse cortical thickening, biopsy is recommended.      05/15/2016 Pathology Results    right axilary node biopsy was negative for malignant cell       05/28/2016 Surgery    Simple left mastectomy      05/28/2016 Pathology Results    Left mastectomy showed invasive ductal carcinoma, grade 3, 3.6 cm, high grade DCIS, (+) LVI, surgical margins were negative. Tumor 3.6 cm, no lymph nodes identified.      06/25/2016 Imaging    Staging CT scan of the chest, abdomen and pelvis with contrast and a bone scan showed no evidence of metastasis. Postoperative fluid collection in the left breast, likely a seroma or hematoma. Right axillary adenopathy, which was biopsied previously.       07/07/2016 -  Chemotherapy    Adjuvant chemotherapy with docetaxel, carboplatin, Herceptin and pejeta  every 3 weeks, for 6 cycles, followed by Herceptin maintenance therapy for total of one year treatment      12/15/2016 -  Anti-estrogen oral therapy    Adjuvant letrozole 2.5 mg once daily      04/29/2017 Mammogram     Mammogram 04/29/2017 IMPRESSION: No mammographic evidence of malignancy. A result letter of this screening mammogram will be mailed directly to the patient.          HISTORY OF PRESENTING ILLNESS: 05/08/16 Lindsey Marquez 66 y.o. female is here because of her recurrent left breast cancer.  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 She was seen by multiple medical oncologist in our practice in the past, last was seen by Dr. Alvy Bimler in  11/2013.  She has been doing well, and is compliant with annual screening mammogram. The screening mammogram on 04/17/2016 showed calcification in the left breast, and core needle biopsy showed invasive and in situ ductal carcinoma, grade 2, ER/PR positive, HER-2 amplified. She was referred to seeing breast surgeon Dr. Georgette Dover and referred back to [place].   She feels well, she did not feel any lump in her breast or axillas latley. She denies any pain, dyspnea, or GI symptoms. She had right nipple rash and right axillary swelling in the past one week, after she drinks some diet tea with citric. Breast MRI showed an enlarged lobulated right inferior axillary lymph node with diffuse cortical thickening, in addition to the known left breast mass which measured 1.3 cm on MRI.  GYN HISTORY  Menarchal: 12 LMP: 2003 Contraceptive: 20 HRT: n/a  G2P2: 75 yo Son and 46 yo son who died   CURRENT THERAPY: maintenance Herceptin every 3 weeks, letrozole started on 12/15/2016  INTERIM HISTORY:  Lindsey Marquez returns for follow up and cycle 10 treatment. She has 2 more treatments to go. She is excited about that. She has been doing well but experiences cramps in her legs. Her swelling has gone down in her legs. She is having no trouble with the Letrozole    MEDICAL HISTORY:  Past Medical History:  Diagnosis Date  . Anxiety   . Breast cancer [facility]) August 2002   Invasive ductal carcinoma Left breast.  . Diabetes mellitus without complication ([place])   . Family history of malignant neoplasm of ovary   . Family history of pancreatic cancer   . Hypertension     SURGICAL HISTORY: Past Surgical History:  Procedure Laterality Date  . BREAST SURGERY Left 2003  . FOOT SURGERY  2000  . MASTECTOMY Left 05/28/2016  . port a cath insertion  05/28/2016  . PORTACATH PLACEMENT Right 05/28/2016   Procedure: INSERTION PORT-A-CATH;  Surgeon: [name], MD;  Location: [place];  Service: General;  Laterality: Right;  . TOTAL  MASTECTOMY Left 05/28/2016   Procedure: LEFT  MASTECTOMY;  Surgeon: [name], MD;  Location: [place];  Service: General;  Laterality: Left;  . TUBAL LIGATION  22 yrs since  2004.   Bilateral.    SOCIAL HISTORY: Social History   Social History  . Marital status: Divorced    Spouse name: N/A  . Number of children: N/A  . Years of education: N/A   Occupational History  . Not on file.   Social History Main Topics  . Smoking status: Former Smoker    Packs/day: 0.10    Years: 7.00    Quit date:  12/01/1975  . Smokeless tobacco: Never Used  . Alcohol use No  . Drug use: No  . Sexual activity: Yes   Other Topics Concern  . Not on file   Social History Narrative  . No narrative on file   She is retired from school   FAMILY HISTORY: Family History  Problem Relation Age of Onset  . Prostate cancer Father   . Ovarian cancer Maternal Aunt 43       deceased  . Pancreatic cancer Mother 62       deceased  . Cancer Maternal Aunt        2 other mat aunts; unk. primary in 32s; deceased  . Cancer Maternal Uncle        "bone ca"; unk. primary; deceased 82s  . Prostate cancer Maternal Uncle        deceased 41    ALLERGIES:  is allergic to codeine.  MEDICATIONS:  Current Outpatient Prescriptions  Medication Sig Dispense Refill  . aspirin 81 MG tablet Take 81 mg by mouth daily.     Marland Kitchen glucose blood (TRUE METRIX BLOOD GLUCOSE TEST) test strip 1 each by Other route 3 (three) times daily. Use as instructed 600 each 2  . glyBURIDE-metformin (GLUCOVANCE) 2.5-500 MG tablet Take 2 tablets by mouth 2 (two) times daily with a meal. 360 tablet 1  . ibuprofen (ADVIL,MOTRIN) 200 MG tablet Take 200 mg by mouth every 8 (eight) hours as needed.      Marland Kitchen letrozole (FEMARA) 2.5 MG tablet Take 1 tablet (2.5 mg total) by mouth daily. 30 tablet 3  . letrozole (FEMARA) 2.5 MG tablet Take 1 tablet (2.5 mg total) by mouth daily. 30 tablet 3  . lidocaine-prilocaine (EMLA) cream Apply to affected area once  30 g 3  . lisinopril-hydrochlorothiazide (PRINZIDE,ZESTORETIC) 20-25 MG tablet TAKE 1 TABLET BY MOUTH DAILY. 90 tablet 0  . Omega-3 Fatty Acids (FISH OIL) 1200 MG CAPS Take 1 capsule by mouth daily.    . ondansetron (ZOFRAN) 8 MG tablet Take 1 tablet (8 mg total) by mouth 2 (two) times daily as needed for refractory nausea / vomiting. Start on day 3 after chemo. 30 tablet 1  . Potassium Chloride ER 20 MEQ TBCR Take 40 mEq by mouth daily. 60 tablet 1  . prochlorperazine (COMPAZINE) 10 MG tablet Take 1 tablet (10 mg total) by mouth every 6 (six) hours as needed (Nausea or vomiting). 30 tablet 1  . torsemide (DEMADEX) 20 MG tablet Take 20 mg by mouth daily.    Marland Kitchen torsemide (DEMADEX) 20 MG tablet TAKE 1 TABLET DAILY 3 DAYS, IF NO IMPROVEMENT, INCREASE TO 1 TABLET BY MOUTH TWICE DAILY 60 tablet 2  . traMADol (ULTRAM) 50 MG tablet Take 2 tablets (100 mg total) by mouth every 6 (six) hours as needed for moderate pain. 20 tablet 0  . TRUEPLUS LANCETS 33G MISC 1 each by Does not apply route 3 (three) times daily. 600 each 2   No current facility-administered medications for this visit.    Facility-Administered Medications Ordered in Other Visits  Medication Dose Route Frequency Provider Last Rate Last Dose  . sodium chloride 0.9 % injection 10 mL  10 mL Intravenous PRN [name], MD   10 mL at 02/16/17 1004    REVIEW OF SYSTEMS:   Constitutional: Denies fevers, chills or abnormal night sweats  Eyes: Denies blurriness of vision, double vision or watery eyes Ears, nose, mouth, throat, and face: Denies mucositis or sore throat Respiratory: Denies  cough, dyspnea or wheezes Cardiovascular: Denies palpitation, chest discomfort (+) reduced bilateral feet swelling Gastrointestinal:  Denies nausea, heartburn or change in bowel habits Skin: Denies abnormal skin rashes (+) hair and nail are slowly growing back since completing chemotherapy Lymphatics: Denies new lymphadenopathy or easy  bruising Neurological:Denies numbness (+) Tingling in hands and feet have gotten better MSK: (+) cramping in leg Behavioral/Psych: Mood is stable, no new changes  All other systems were reviewed with the patient and are negative.  PHYSICAL EXAMINATION:  ECOG PERFORMANCE STATUS: 1  Vitals:   06/03/17 1042  BP: (!) 148/67  Pulse: 96  Resp: 20  Temp: 98.4 F (36.9 C)   Filed Weights   06/03/17 1042  Weight: 229 lb 9.6 oz (104.1 kg)    GENERAL:alert, no distress and comfortable SKIN: skin color, texture, turgor are normal, no rashes or significant lesions EYES: normal, conjunctiva are pink and non-injected, sclera clear OROPHARYNX:no exudate, no erythema and lips, buccal mucosa, and tongue normal  NECK: supple, thyroid normal size, non-tender, without nodularity LYMPH:  no palpable lymphadenopathy in the cervical, axillary or inguinal LUNGS: clear to auscultation and percussion with normal breathing effort HEART: regular rate & rhythm and no murmurs  ABDOMEN:abdomen soft, non-tender and normal bowel sounds Musculoskeletal:no cyanosis of digits and no clubbing  PSYCH: alert & oriented x 3 with fluent speech NEURO: no focal motor/sensory deficits,  Breasts: Breast inspection showed left breast is surgically absent, surgical incision has healed well. Exam of the right breast and bilateral axillas reviewed no palpable mass or adenopathy.  LABORATORY DATA:  I have reviewed the data as listed CBC Latest Ref Rng & Units 06/03/2017 05/12/2017 04/21/2017  WBC 3.9 - 10.3 10e3/uL 8.0 8.7 7.7  Hemoglobin 11.6 - 15.9 g/dL 9.7(L) 10.3(L) 9.9(L)  Hematocrit 34.8 - 46.6 % 29.0(L) 30.2(L) 29.7(L)  Platelets 145 - 400 10e3/uL 257 327 256   CMP Latest Ref Rng & Units 06/03/2017 05/12/2017 04/21/2017  Glucose 70 - 140 mg/dl 121 144(H) 95  BUN 7.0 - 26.0 mg/dL 38.2(H) 37.6(H) 34.6(H)  Creatinine 0.6 - 1.1 mg/dL 1.2(H) 1.3(H) 1.1  Sodium 136 - 145 mEq/L 139 142 139  Potassium 3.5 - 5.1 mEq/L 3.6 4.9  4.1  Chloride 96 - 112 mEq/L - - -  CO2 22 - 29 mEq/L _0 Calcium 8.4 - 10.4 mg/dL 9.3 9.9 9.6  Total Protein 6.4 - 8.3 g/dL 8.0 8.0 8.0  Total Bilirubin 0.20 - 1.20 mg/dL 0.42 0.50 0.38  Alkaline Phos 40 - 150 U/L 68 72 68  AST 5 - 34 U/L _1 ALT 0 - 55 U/L 32 31 31    PATHOLOGY REPORT  Diagnosis 04/17/2016 Breast, left, needle core biopsy, inner mid breast - INVASIVE DUCTAL CARCINOMA WITH CALCIFICATIONS, SEE COMMENT. - DUCTAL CARCINOMA IN SITU WITH COMEDONECROSIS. Microscopic Comment While grading is best performed on the resection specimen the invasive carcinoma appears grade 2. Prognostic markers will be ordered and reported in an addendum. Dr. Lyndon Code has reviewed the case. The case was called to [facility] on 04/20/2016.  Results: HER2 - *EQUIVOCAL* (Her2 by IHC will be ordered.) RATIO OF HER2/CEP17 SIGNALS 1.73 AVERAGE HER2 COPY NUMBER PER CELL 4.15 By immunohistochemistry, the tumor cells are positive for Her2 (3).  Results: IMMUNOHISTOCHEMICAL AND MORPHOMETRIC ANALYSIS PERFORMED MANUALLY Estrogen Receptor: 100%, POSITIVE, STRONG STAINING INTENSITY Progesterone Receptor: 15%, POSITIVE, STRONG STAINING INTENSITY Proliferation Marker Ki67: 10%  Diagnosis 05/15/2016 Lymph node, needle/core biopsy, right axilla - BENIGN  REACTIVE LYMPH NODE. - NO GRANULOMAS OR MALIGNANCY IDENTIFIED. - SEE COMMENT. Microscopic Comment Internal departmental review obtained (Dr. Lyndon Code) with agreement. Results are phoned to [place] (05/18/16). (MEG:gt, 05/18/16)  Breast, simple mastectomy, Left 05/28/2016 - INVASIVE DUCTAL CARCINOMA, GRADE III/III, SPANNING 3.6 CM. - DUCTAL CARCINOMA IN SITU, HIGH GRADE. - LYMPHOVASCULAR INVASION IS IDENTIFIED. - THE SURGICAL RESECTION MARGINS ARE NEGATIVE FOR CARCINOMA. - SEE ONCOLOGY TABLE BELOW. Microscopic Comment BREAST, INVASIVE TUMOR, WITHOUT LYMPH NODES PRESENT Specimen, including  laterality : Left breast Procedure: Simple mastectomy Histologic type: Ductal with micropapillary features Grade: III Tubule formation: 3 Nuclear pleomorphism: 2 Mitotic: 3 Tumor size (gross measurement): 3.6 cm Margins: Greater than 0.2 cm to all margins Lymphovascular invasion: Present Ductal carcinoma in situ: Present Grade: High grade Extensive intraductal component: Yes Lobular neoplasia: Not identified Tumor focality: Unifocal Treatment effect: N/A Extent of tumor: Confined to breast parenchyma. Breast prognostic profile: [id number] Estrogen receptor: 100%, strong staining intensity Progesterone receptor: 15%, strong staining intensity Her 2 neu: Amplification was detected by immunohistochemistry. Ki-67: 10% 1 of 2 FINAL for SHAWNDELL, VARAS ([id number]) Microscopic Comment(continued) Non-neoplastic breast: No significant findings. TNM: pT2, pNX (clinically recurrent). Comments: In addition to the main tumor, there is ductal carcinoma in situ present in random tissue submitted from the inferior lateral quadrant. (JBK:kh 06-01-16)  RADIOGRAPHIC STUDIES: I have personally reviewed the radiological images as listed and agreed with the findings in the report. No results found.   DG Chest 2 View 10/28/2016 IMPRESSION: No acute cardiopulmonary abnormality.  Bone Density 04/29/2017 DualFemur Neck Right 04/29/2017    65.9         -0.8    0.928 g/cm2  AP Spine  L1-L4      04/29/2017    65.9         1.7     1.406 g/cm2  Mammogram 04/29/2017 IMPRESSION: No mammographic evidence of malignancy. A result letter of this screening mammogram will be mailed directly to the patient.  ASSESSMENT & PLAN:  66 y.o. 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  1. Cancer of central portion of left breast, pT2NxM0, stage IIA, G3, triple positive -I previously  reviewed her surgical pathology findings in great details with patient and her family member. It showed a 3.6 cm mass, grade 3, surgical margins were negative. She had no sentinel lymph node biopsy due to her prior history of axillary lymph node dissection. -Her restaging CT scan of bone scan showed no evidence of distant metastasis, I reviewed with patient. -We again discussed the risk of cancer recurrence of this complete surgical resection. We reviewed that HER-2 positive with cancers are more aggressive, with increased risk of metastasis.  -she has completed adjuvant TCHP (docetaxel, carbotaxol, Herceptin, pejeta), every 3 weeks for total of 6 cycles, now on maintenance Herceptin every 3 weeks to complete 1 year treatment. - her last echo was 5/18, EF normal  - She is clinically doing well. Labs reviewed, slight anemia. She is adequate for treatment today. We'll continue Herceptin maintenance therapy, she has 2 more treatments to complete. - I strongly encouraged her to start calcium, vitamin d and multi vitamins. - will discuss oral her2 antibody Neratinib on next visit. She will continue Letrozole  -Continue breast cancer surveillance. She is clinically doing well, exam unremarkable, last mammogram in May 2018 was negative. No clinical concern for recurrence.   2. Anemia  -  Secondary to chemotherapy, her hemoglobin is 9.7, she has mild symptoms from anemia -She previously received a platelet transfusion. -Check iron next visit. If it is low, she will start taking an iron pill.For now, just take a multivitamin.   3. DM and HTN  -She'll continue follow-up with her primary care physician -We again discussed her blood pressure and glucose level may be impacted by chemotherapy, and dexamethasone she takes before and after chemotherapy. -previously I strongly encouraged her to monitor her blood pressure and glucose at home, and consider increasing the dose of metformin and glyburide if needed -her  BP and blood glucose has been normal/well controlled lately  4. Obesity  -I previously encouraged her to eat healthy and exercise regularly, she is willing to lose some weight after she completes chemotherapy.  5. Peripheral neuropathy, G1 -Secondary to chemotherapy, especially docetaxel -I previously encouraged her to take vitamin B complex. She does not feel she needs medication for now.  6. Fatigue  -Probably related to her previous chemotherapy and anemia -I previously encouraged her to gradually increase her exercise   7. Bone Health -We previously discussed that Letrozole can cause some bone weakening  -She previously has had a bone density scan in the past, but it was several years ago -repeated DEXA scan showed normal bone density  -I encouraged her to take calcium and vitamin D. We'll monitor her limited D levels.  8. Leg cramping - I encouraged her to drink enough water to avoid dehydration and try calcium supplements   Plan -Lab reviewed, she'll proceed Herceptin today, and continue every 3 weeks for 2 more doses - continue Letrozole  - f/u and lab  in 6 weeks  All questions were answered. The patient knows to call the clinic with any problems, questions or concerns.  I spent 20 minutes counseling the patient face to face. The total time spent in the appointment was 25 minutes and more than 50% was on counseling.  This document serves as a record of services personally performed by [name], MD. It was created on her behalf by Brandt Loosen, a trained medical scribe. The creation of this record is based on the scribe's personal observations and the provider's statements to them. This document has been checked and approved by the attending provider.    I have reviewed the above documentation for accuracy and completeness and I agree with the above.   [name], MD 06/03/2017

## 2017-06-03 ENCOUNTER — Ambulatory Visit (HOSPITAL_BASED_OUTPATIENT_CLINIC_OR_DEPARTMENT_OTHER): Payer: Medicare PPO

## 2017-06-03 ENCOUNTER — Ambulatory Visit (HOSPITAL_BASED_OUTPATIENT_CLINIC_OR_DEPARTMENT_OTHER): Payer: Medicare PPO | Admitting: Hematology

## 2017-06-03 ENCOUNTER — Ambulatory Visit: Payer: Medicare PPO

## 2017-06-03 ENCOUNTER — Telehealth: Payer: Self-pay | Admitting: Hematology

## 2017-06-03 ENCOUNTER — Other Ambulatory Visit (HOSPITAL_BASED_OUTPATIENT_CLINIC_OR_DEPARTMENT_OTHER): Payer: Medicare PPO

## 2017-06-03 VITALS — BP 148/67 | HR 96 | Temp 98.4°F | Resp 20 | Ht 69.0 in | Wt 229.6 lb

## 2017-06-03 DIAGNOSIS — D6481 Anemia due to antineoplastic chemotherapy: Secondary | ICD-10-CM

## 2017-06-03 DIAGNOSIS — Z95828 Presence of other vascular implants and grafts: Secondary | ICD-10-CM

## 2017-06-03 DIAGNOSIS — Z5112 Encounter for antineoplastic immunotherapy: Secondary | ICD-10-CM

## 2017-06-03 DIAGNOSIS — C50112 Malignant neoplasm of central portion of left female breast: Secondary | ICD-10-CM

## 2017-06-03 DIAGNOSIS — I10 Essential (primary) hypertension: Secondary | ICD-10-CM

## 2017-06-03 DIAGNOSIS — E119 Type 2 diabetes mellitus without complications: Secondary | ICD-10-CM | POA: Diagnosis not present

## 2017-06-03 DIAGNOSIS — Z853 Personal history of malignant neoplasm of breast: Secondary | ICD-10-CM

## 2017-06-03 DIAGNOSIS — Z170 Estrogen receptor positive status [ER+]: Secondary | ICD-10-CM

## 2017-06-03 DIAGNOSIS — G620 Drug-induced polyneuropathy: Secondary | ICD-10-CM

## 2017-06-03 LAB — CBC WITH DIFFERENTIAL/PLATELET
BASO%: 0.4 % (ref 0.0–2.0)
Basophils Absolute: 0 10*3/uL (ref 0.0–0.1)
EOS%: 2.4 % (ref 0.0–7.0)
Eosinophils Absolute: 0.2 10*3/uL (ref 0.0–0.5)
HCT: 29 % — ABNORMAL LOW (ref 34.8–46.6)
HGB: 9.7 g/dL — ABNORMAL LOW (ref 11.6–15.9)
LYMPH%: 33.9 % (ref 14.0–49.7)
MCH: 34 pg (ref 25.1–34.0)
MCHC: 33.4 g/dL (ref 31.5–36.0)
MCV: 101.8 fL — ABNORMAL HIGH (ref 79.5–101.0)
MONO#: 0.5 10*3/uL (ref 0.1–0.9)
MONO%: 5.7 % (ref 0.0–14.0)
NEUT#: 4.6 10*3/uL (ref 1.5–6.5)
NEUT%: 57.6 % (ref 38.4–76.8)
Platelets: 257 10*3/uL (ref 145–400)
RBC: 2.85 10*6/uL — ABNORMAL LOW (ref 3.70–5.45)
RDW: 13 % (ref 11.2–14.5)
WBC: 8 10*3/uL (ref 3.9–10.3)
lymph#: 2.7 10*3/uL (ref 0.9–3.3)

## 2017-06-03 LAB — COMPREHENSIVE METABOLIC PANEL
ALT: 32 U/L (ref 0–55)
AST: 29 U/L (ref 5–34)
Albumin: 4 g/dL (ref 3.5–5.0)
Alkaline Phosphatase: 68 U/L (ref 40–150)
Anion Gap: 12 mEq/L — ABNORMAL HIGH (ref 3–11)
BUN: 38.2 mg/dL — ABNORMAL HIGH (ref 7.0–26.0)
CO2: 25 mEq/L (ref 22–29)
Calcium: 9.3 mg/dL (ref 8.4–10.4)
Chloride: 102 mEq/L (ref 98–109)
Creatinine: 1.2 mg/dL — ABNORMAL HIGH (ref 0.6–1.1)
EGFR: 54 mL/min/{1.73_m2} — ABNORMAL LOW (ref >90)
Glucose: 121 mg/dl (ref 70–140)
Potassium: 3.6 mEq/L (ref 3.5–5.1)
Sodium: 139 mEq/L (ref 136–145)
Total Bilirubin: 0.42 mg/dL (ref 0.20–1.20)
Total Protein: 8 g/dL (ref 6.4–8.3)

## 2017-06-03 MED ORDER — HEPARIN SOD (PORK) LOCK FLUSH 100 UNIT/ML IV SOLN
500.0000 [IU] | Freq: Once | INTRAVENOUS | Status: AC | PRN
  Administered 2017-06-03: 500 [IU]
  Filled 2017-06-03: qty 5

## 2017-06-03 MED ORDER — DIPHENHYDRAMINE HCL 25 MG PO CAPS: 50.0000 mg | ORAL_CAPSULE | Freq: Once | ORAL | Status: DC

## 2017-06-03 MED ORDER — ACETAMINOPHEN 325 MG PO TABS
ORAL_TABLET | ORAL | Status: AC
  Filled 2017-06-03: qty 2

## 2017-06-03 MED ORDER — DIPHENHYDRAMINE HCL 25 MG PO CAPS
ORAL_CAPSULE | ORAL | Status: AC
  Filled 2017-06-03: qty 1

## 2017-06-03 MED ORDER — SODIUM CHLORIDE 0.9 % IJ SOLN
10.0000 mL | INTRAMUSCULAR | Status: DC | PRN
  Administered 2017-06-03: 10 mL via INTRAVENOUS
  Filled 2017-06-03: qty 10

## 2017-06-03 MED ORDER — DIPHENHYDRAMINE HCL 25 MG PO CAPS
25.0000 mg | ORAL_CAPSULE | Freq: Once | ORAL | Status: AC
  Administered 2017-06-03: 25 mg via ORAL

## 2017-06-03 MED ORDER — SODIUM CHLORIDE 0.9 % IV SOLN
Freq: Once | INTRAVENOUS | Status: AC
  Administered 2017-06-03: 12:00:00 via INTRAVENOUS

## 2017-06-03 MED ORDER — ACETAMINOPHEN 325 MG PO TABS
650.0000 mg | ORAL_TABLET | Freq: Once | ORAL | Status: AC
  Administered 2017-06-03: 650 mg via ORAL

## 2017-06-03 MED ORDER — DIPHENHYDRAMINE HCL 25 MG PO CAPS
ORAL_CAPSULE | ORAL | Status: AC
  Filled 2017-06-03: qty 2

## 2017-06-03 MED ORDER — SODIUM CHLORIDE 0.9% FLUSH
10.0000 mL | INTRAVENOUS | Status: DC | PRN
  Administered 2017-06-03: 10 mL
  Filled 2017-06-03: qty 10

## 2017-06-03 MED ORDER — TRASTUZUMAB CHEMO 150 MG IV SOLR
650.0000 mg | Freq: Once | INTRAVENOUS | Status: AC
  Administered 2017-06-03: 650 mg via INTRAVENOUS
  Filled 2017-06-03: qty 30.95

## 2017-06-03 NOTE — Patient Instructions (Signed)

## 2017-06-03 NOTE — Patient Instructions (Signed)
[place] Discharge Instructions for Patients Receiving Chemotherapy  Today you received the following chemotherapy agents: Herceptin   To help prevent nausea and vomiting after your treatment, we encourage you to take your nausea medication as direcetd   If you develop nausea and vomiting that is not controlled by your nausea medication, call the clinic.   BELOW ARE SYMPTOMS THAT SHOULD BE REPORTED IMMEDIATELY:  *FEVER GREATER THAN 100.5 F  *CHILLS WITH OR WITHOUT FEVER  NAUSEA AND VOMITING THAT IS NOT CONTROLLED WITH YOUR NAUSEA MEDICATION  *UNUSUAL SHORTNESS OF BREATH  *UNUSUAL BRUISING OR BLEEDING  TENDERNESS IN MOUTH AND THROAT WITH OR WITHOUT PRESENCE OF ULCERS  *URINARY PROBLEMS  *BOWEL PROBLEMS  UNUSUAL RASH Items with * indicate a potential emergency and should be followed up as soon as possible.  Feel free to call the clinic you have any questions or concerns. The clinic phone number is [phone].  Please show the [place] at check-in to the Emergency Department and triage nurse.

## 2017-06-03 NOTE — Telephone Encounter (Signed)
Scheduled appt per 7/5 los - Gave patient AVS and calender per los.  

## 2017-06-05 ENCOUNTER — Encounter: Payer: Self-pay | Admitting: Hematology

## 2017-06-12 IMAGING — CT CT ABD-PELV W/ CM
3 of 5 series · 15 of 36 positions shown, 18 images · IV contrast (ISOVUE 300)
Comparison: Today's bone scan, dictated separately.

CLINICAL DATA: Restaging of left breast cancer diagnosed in 5886 in
8861. Mastectomy earlier this year. Chemotherapy to start.

EXAM:
CT CHEST, ABDOMEN, AND PELVIS WITH CONTRAST
TECHNIQUE: Multidetector CT imaging of the chest, abdomen and pelvis was
performed following the standard protocol during bolus
administration of intravenous contrast.
CONTRAST:  100mL 2LWULG-PFF IOPAMIDOL (2LWULG-PFF) INJECTION 61%

[Series 2: cap w · axial · 0.82mm/px · z∈[-624,-94]mm · 10 of 130 slices shown, 13 images]
[im 12/130  mediastinal]
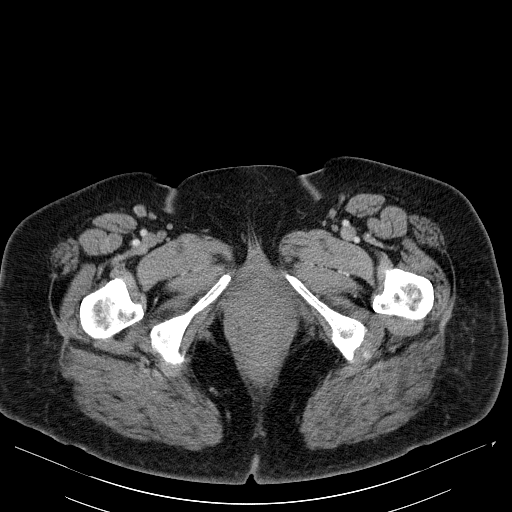
[im 12/130  lung]
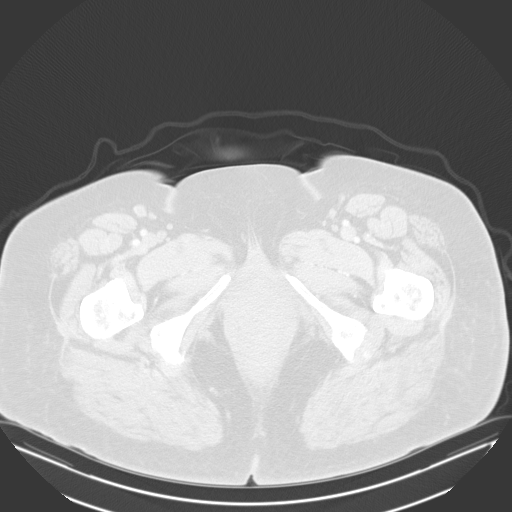
[im 24/130  lung]
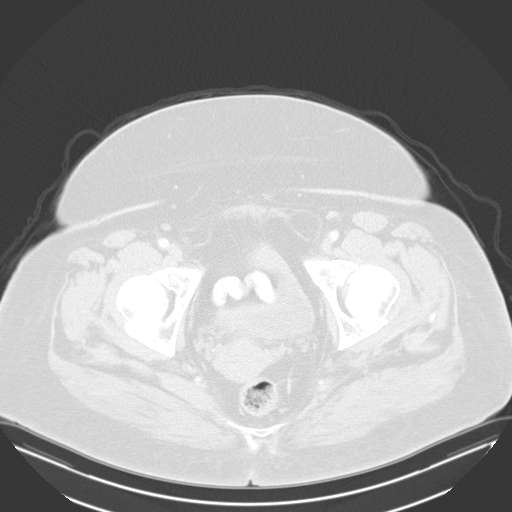
[im 36/130  lung]
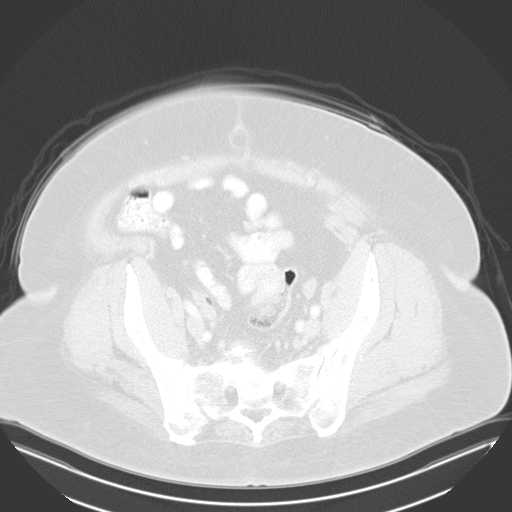
[im 47/130  lung]
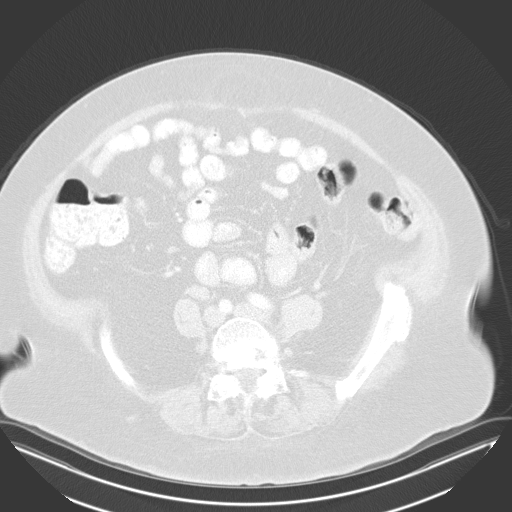
[im 59/130  mediastinal]
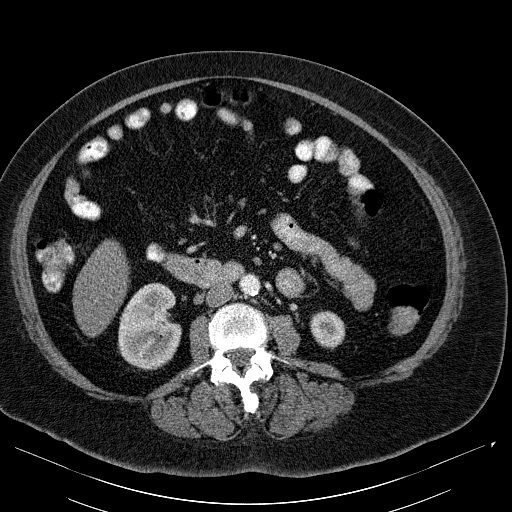
[im 59/130  lung]
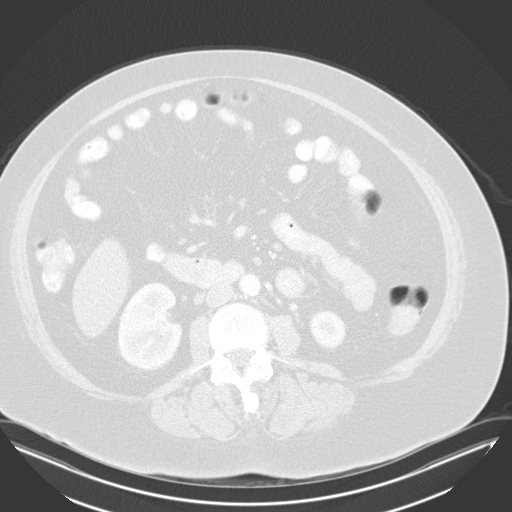
[im 71/130  lung]
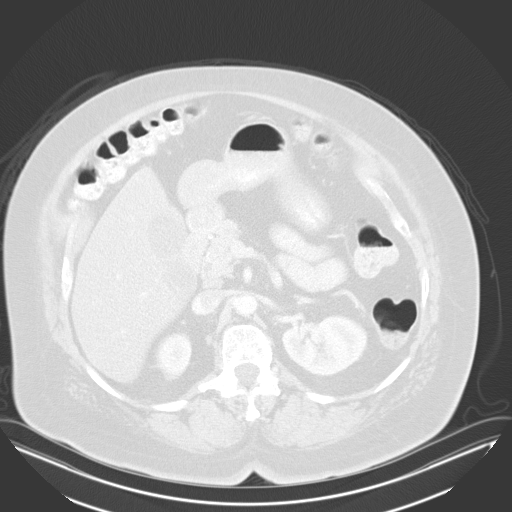
[im 83/130  lung]
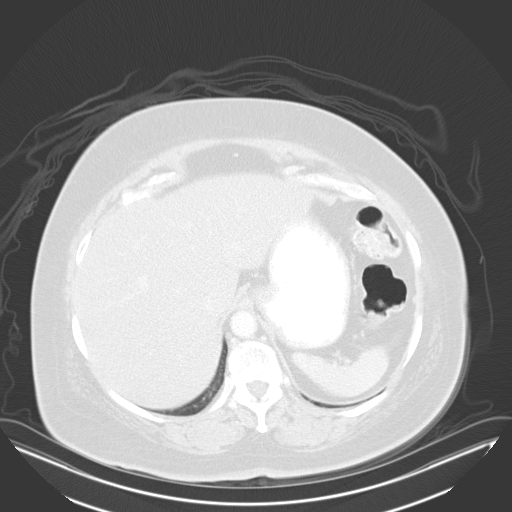
[im 94/130  lung]
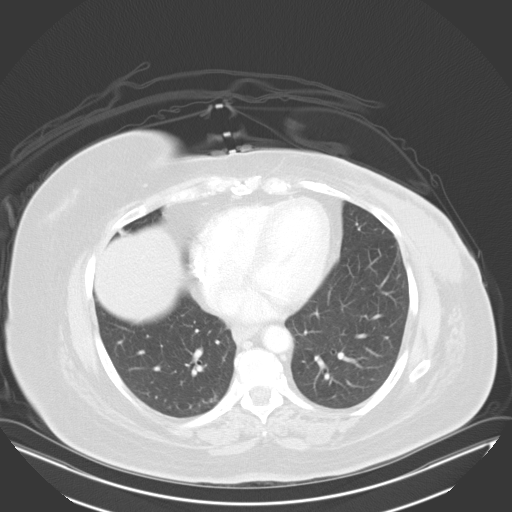
[im 106/130  mediastinal]
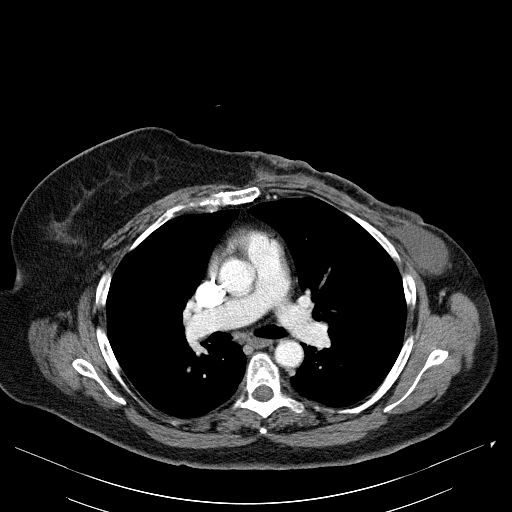
[im 106/130  lung]
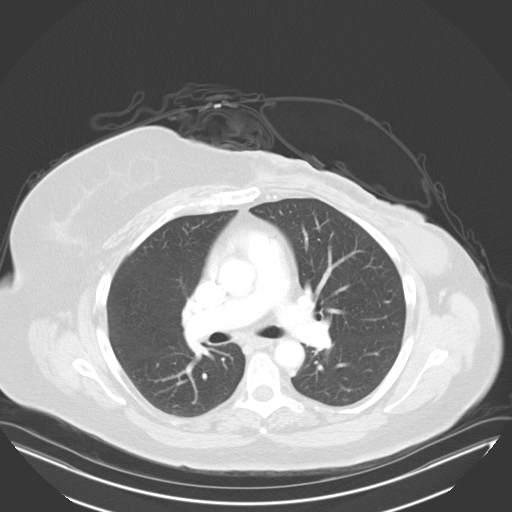
[im 118/130  lung]
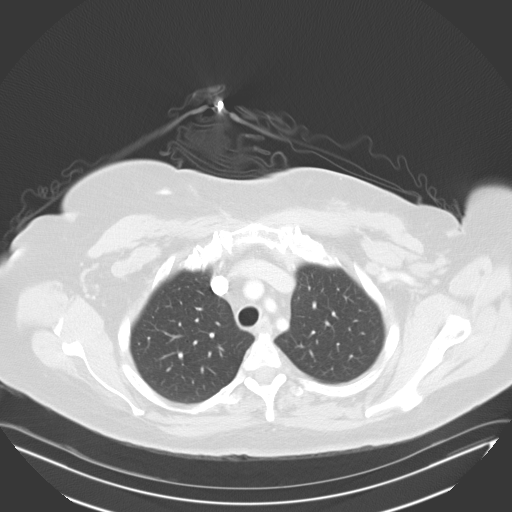

[Series 5: lung · axial · 0.82mm/px · z∈[-280,-236]mm · 2 of 136 slices shown]
[im 12/136  lung]
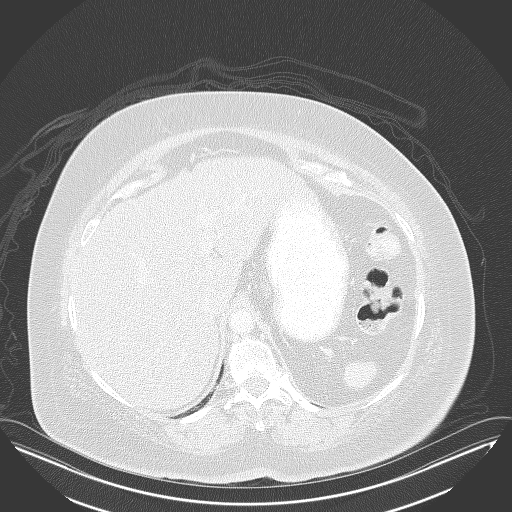
[im 34/136  lung]
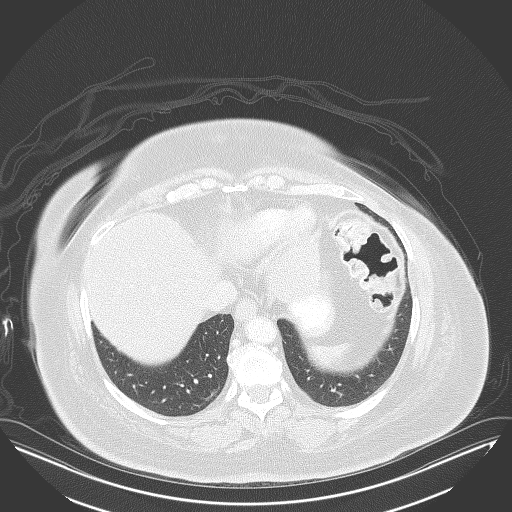

[Series 603: <mpr thick range(1)> · coronal · 1.27mm/px · 3 of 166 slices shown]
[im 34/166  lung]
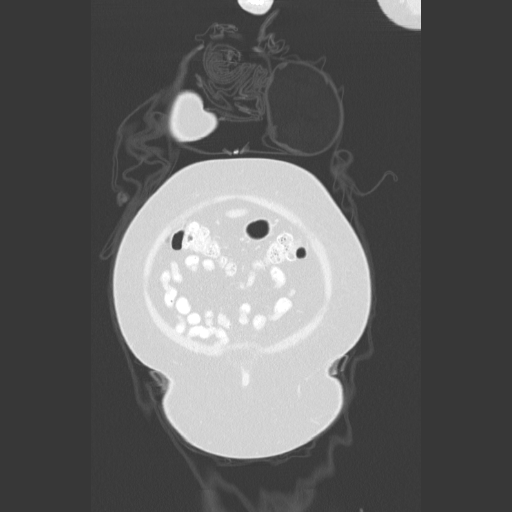
[im 67/166  lung]
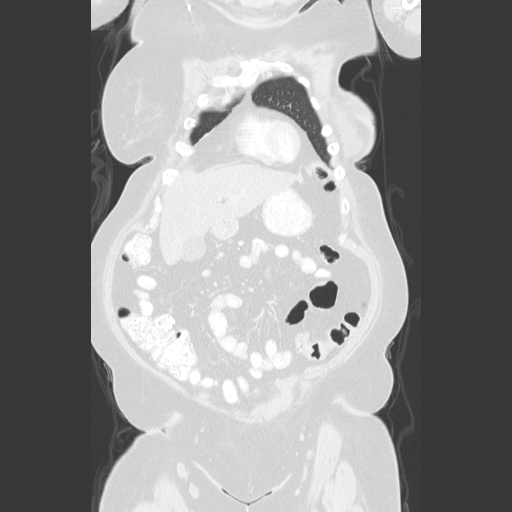
[im 100/166  lung]
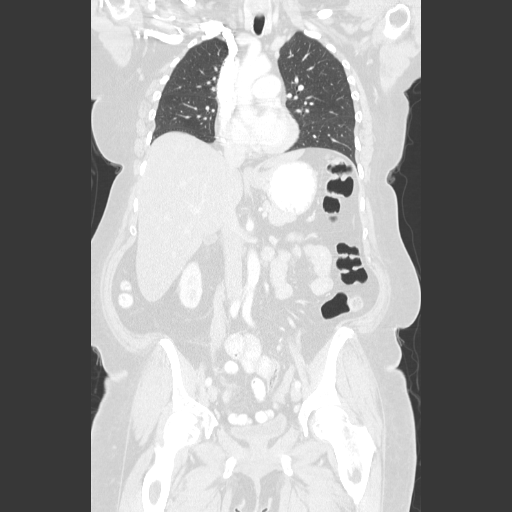

[15 of 36 positions shown; findings below may reference images not displayed]

Breast MR
05/05/2016. Chest radiograph 05/28/2016. Report of chest abdomen and
pelvic CTs of 09/12/2002.
FINDINGS: CT CHEST FINDINGS

Mediastinum/Lymph Nodes: No supraclavicular adenopathy. A right
Port-A-Cath terminates at the high right atrium. Right axillary
nodes measure up to 1.0 short axis on image 20/series 7. Left
axillary node dissection. No left axillary adenopathy. No
subpectoral adenopathy.

Left mastectomy a with presumed postoperative fluid collection in
the chest wall. 14.7 x 3.5 cm.

Aortic atherosclerosis. Normal heart size, without pericardial
effusion. No central pulmonary embolism, on this non-dedicated
study. No mediastinal or hilar adenopathy. No internal mammary
adenopathy.

Lungs/Pleura: No pleural fluid.  Clear lungs.

Musculoskeletal: No acute osseous abnormality.

CT ABDOMEN PELVIS FINDINGS

Hepatobiliary: Moderate hepatic steatosis with hepatomegaly at
cm craniocaudal. No focal liver lesion. Normal gallbladder, without
biliary ductal dilatation.

Pancreas: Normal, without mass or ductal dilatation.

Spleen: Normal in size, without focal abnormality.

Adrenals/Urinary Tract: Normal adrenal glands. Normal kidneys,
without hydronephrosis. Normal urinary bladder.

Stomach/Bowel: Normal stomach, without wall thickening. Normal
colon, appendix, and terminal ileum. Normal small bowel.

Vascular/Lymphatic: Aortic and branch vessel atherosclerosis. No
abdominopelvic adenopathy.

Reproductive: Central uterine hypoattenuation at 11 mm on image 103
sagittal. No adnexal mass.

Other: No significant free fluid. Fat containing bilateral inguinal
hernias are tiny. There is also a fat containing ventral abdominal
wall hernia on image 85/series 2.

Musculoskeletal: No suspicious osseous lesion. Degenerative disc
disease is advanced,/at L4-5 and L5-S1. Convex right lumbar spine
curvature.
IMPRESSION: 1. Left mastectomy with postoperative fluid collection in the left
chest wall. This most likely a postoperative seroma or hematoma.
2. Prominent right axillary nodes, as detailed on prior MRI and
sampled on 05/15/2016.
3. Otherwise, no evidence of metastatic disease in the chest,
abdomen, or pelvis.
4. Hepatic steatosis and hepatomegaly.
5.  Aortic atherosclerosis.
6. Fat containing ventral and bilateral inguinal hernias.
7. Central uterine hypoattenuation which is abnormal for patient
age. Correlate with postmenopausal bleeding. Especially if there is
any such symptoms, consider pelvic ultrasound.

## 2017-06-18 NOTE — Progress Notes (Signed)
[place]  [phone] [phone]  Clinic Follow Up Note   Patient Care Team: [name], NP as PCP - General (Internal Medicine) [name], MD as Consulting Physician (General Surgery) 06/24/2017   CHIEF COMPLAINTS:  Follow up recurrent left breast cancer   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 [facility])   Staging form: Breast, AJCC 7th Edition     Clinical stage from 04/17/2016: Stage IA (T1c, N0, M0) - Signed by [name], MD on 05/07/2016     Pathologic stage from 05/28/2016: Stage IIA (T2, N0, cM0) - Signed by [name], MD on 06/11/2016 History of left breast cancer   Staging form: Breast, AJCC 7th Edition     Clinical: Stage IIIB (T4d, N1, cM0) - Signed by [name], MD on 12/14/2013     Pathologic: Stage IIIB (T4d, N1a, cM0) - Signed by [name], MD on 12/14/2013       History of left breast cancer   09/12/2002 Imaging    CT scan show no evidence of disease apart from the left axilla      09/2002 -  Neo-Adjuvant Chemotherapy    TAC X4 cycles       01/10/2003 Surgery    The patient had left lumpectomy and axillary lymph node dissection which show residual breast cancer and a sentinel lymph node was positive      2004 -  Adjuvant Chemotherapy    Cytoxan with 5-FU x4 cycles (methotrexate added at cycle 4).      2004 -  Radiation Therapy    Adjuvant left breast radiation      12/2002 Receptors her2    ER, PR and HER-2 were all negative.       Cancer of central portion of left breast ([place])   04/13/2016 Mammogram    Scheduler coarse heterogeneous calcifications spanning an area of 3.7 cm in the lumpectomy site, indeterminate. Ultrasound of the left axilla was negative.      04/17/2016 Initial Diagnosis    Cancer of central portion of left breast ([place])      04/17/2016 Initial Biopsy    Left breast core needle biopsy showed invasive and in situ ductal carcinoma with calcification, grade 2.      04/17/2016 Receptors her2    Your 100% positive, PR 15% positive, strong staining, Ki-67 10%, HER-2 positive by IHC (3)      05/05/2016 Imaging    Bilateral breast MRI showed a 1.3 x 0.8 x 0.7 cm biopsy-proven ductal carcinoma in the region of the previous lumpectomy. Enlarged lobulated right inferior axillary lymph node with diffuse cortical thickening, biopsy is recommended.      05/15/2016 Pathology Results    right axilary node biopsy was negative for malignant cell       05/28/2016 Surgery    Simple left mastectomy      05/28/2016 Pathology Results    Left mastectomy showed invasive ductal carcinoma, grade 3, 3.6 cm, high grade DCIS, (+) LVI, surgical margins were negative. Tumor 3.6 cm, no lymph nodes identified.      06/25/2016 Imaging    Staging CT scan of the chest, abdomen and pelvis with contrast and a bone scan showed no evidence of metastasis. Postoperative fluid collection in the left breast, likely a seroma or hematoma. Right axillary adenopathy, which was biopsied previously.       07/07/2016 -  Chemotherapy    Adjuvant chemotherapy with docetaxel, carboplatin, Herceptin and pejeta  every 3 weeks, for 6 cycles, followed by Herceptin maintenance therapy for total of one year treatment  Ended [facility] 11/04/16        12/15/2016 -  Anti-estrogen oral therapy    Adjuvant letrozole 2.5 mg once daily      04/29/2017 Mammogram     Mammogram 04/29/2017 IMPRESSION: No mammographic evidence of malignancy. A result letter of this screening mammogram will be mailed directly to the patient.          HISTORY OF PRESENTING ILLNESS: 05/08/16 Lindsey Marquez 66 y.o. female is here because of her recurrent left breast cancer.  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 She was seen by multiple medical oncologist in our practice in the past, last was seen  by Dr. Alvy Bimler in 11/2013.  She has been doing well, and is compliant with annual screening mammogram. The screening mammogram on 04/17/2016 showed calcification in the left breast, and core needle biopsy showed invasive and in situ ductal carcinoma, grade 2, ER/PR positive, HER-2 amplified. She was referred to seeing breast surgeon Dr. Georgette Dover and referred back to [place].   She feels well, she did not feel any lump in her breast or axillas latley. She denies any pain, dyspnea, or GI symptoms. She had right nipple rash and right axillary swelling in the past one week, after she drinks some diet tea with citric. Breast MRI showed an enlarged lobulated right inferior axillary lymph node with diffuse cortical thickening, in addition to the known left breast mass which measured 1.3 cm on MRI.  GYN HISTORY  Menarchal: 12 LMP: 2003 Contraceptive: 20 HRT: n/a  G2P2: 17 yo Son and 63 yo son who died   CURRENT THERAPY: maintenance Herceptin every 3 weeks, letrozole started on 12/15/2016   INTERIM HISTORY:  Lindsey Marquez returns for follow up and cycle 11 treatment. She presents to the clinic today reporting no new issues. She overall feels fine. She has no issues with letrozole.   She has 6 kids at home she tends to.   MEDICAL HISTORY:  Past Medical History:  Diagnosis Date  . Anxiety   . Breast cancer [facility]) August 2002   Invasive ductal carcinoma Left breast.  . Diabetes mellitus without complication ([place])   . Family history of malignant neoplasm of ovary   . Family history of pancreatic cancer   . Hypertension     SURGICAL HISTORY: Past Surgical History:  Procedure Laterality Date  . BREAST SURGERY Left 2003  . FOOT SURGERY  2000  . MASTECTOMY Left 05/28/2016  . port a cath insertion  05/28/2016  . PORTACATH PLACEMENT Right 05/28/2016   Procedure: INSERTION PORT-A-CATH;  Surgeon: [name], MD;  Location: [place];  Service: General;  Laterality: Right;  . TOTAL MASTECTOMY Left 05/28/2016    Procedure: LEFT  MASTECTOMY;  Surgeon: [name], MD;  Location: [place];  Service: General;  Laterality: Left;  . TUBAL LIGATION  22 yrs since  2004.   Bilateral.    SOCIAL HISTORY: Social History   Social History  . Marital status: Divorced    Spouse name: N/A  . Number of children: N/A  . Years of education: N/A   Occupational History  . Not on file.   Social History Main Topics  . Smoking status: Former Smoker    Packs/day: 0.10    Years: 7.00    Quit date: 12/01/1975  .  Smokeless tobacco: Never Used  . Alcohol use No  . Drug use: No  . Sexual activity: Yes   Other Topics Concern  . Not on file   Social History Narrative  . No narrative on file   She is retired from school   FAMILY HISTORY: Family History  Problem Relation Age of Onset  . Prostate cancer Father   . Ovarian cancer Maternal Aunt 81       deceased  . Pancreatic cancer Mother 51       deceased  . Cancer Maternal Aunt        2 other mat aunts; unk. primary in 52s; deceased  . Cancer Maternal Uncle        "bone ca"; unk. primary; deceased 47s  . Prostate cancer Maternal Uncle        deceased 10    ALLERGIES:  is allergic to codeine.  MEDICATIONS:  Current Outpatient Prescriptions  Medication Sig Dispense Refill  . aspirin 81 MG tablet Take 81 mg by mouth daily.     Marland Kitchen glucose blood (TRUE METRIX BLOOD GLUCOSE TEST) test strip 1 each by Other route 3 (three) times daily. Use as instructed 600 each 2  . glyBURIDE-metformin (GLUCOVANCE) 2.5-500 MG tablet Take 2 tablets by mouth 2 (two) times daily with a meal. 360 tablet 1  . ibuprofen (ADVIL,MOTRIN) 200 MG tablet Take 200 mg by mouth every 8 (eight) hours as needed.      Marland Kitchen letrozole (FEMARA) 2.5 MG tablet Take 1 tablet (2.5 mg total) by mouth daily. 30 tablet 3  . lidocaine-prilocaine (EMLA) cream Apply to affected area once 30 g 3  . lisinopril-hydrochlorothiazide (PRINZIDE,ZESTORETIC) 20-25 MG tablet TAKE 1 TABLET BY MOUTH DAILY. 90 tablet 0    . Omega-3 Fatty Acids (FISH OIL) 1200 MG CAPS Take 1 capsule by mouth daily.    . Potassium Chloride ER 20 MEQ TBCR Take 40 mEq by mouth daily. 60 tablet 1  . torsemide (DEMADEX) 20 MG tablet Take 20 mg by mouth daily.    . TRUEPLUS LANCETS 33G MISC 1 each by Does not apply route 3 (three) times daily. 600 each 2  . Neratinib Maleate (NERLYNX) 40 MG tablet Take 6 tablets (240 mg total) by mouth daily. Take with food. 180 tablet 1  . ondansetron (ZOFRAN) 8 MG tablet Take 1 tablet (8 mg total) by mouth 2 (two) times daily as needed for refractory nausea / vomiting. Start on day 3 after chemo. (Patient not taking: Reported on 06/24/2017) 30 tablet 1  . prochlorperazine (COMPAZINE) 10 MG tablet Take 1 tablet (10 mg total) by mouth every 6 (six) hours as needed (Nausea or vomiting). (Patient not taking: Reported on 06/24/2017) 30 tablet 1  . traMADol (ULTRAM) 50 MG tablet Take 2 tablets (100 mg total) by mouth every 6 (six) hours as needed for moderate pain. (Patient not taking: Reported on 06/24/2017) 20 tablet 0   No current facility-administered medications for this visit.    Facility-Administered Medications Ordered in Other Visits  Medication Dose Route Frequency Provider Last Rate Last Dose  . sodium chloride 0.9 % injection 10 mL  10 mL Intravenous PRN [name], MD   10 mL at 02/16/17 1004  . sodium chloride flush (NS) 0.9 % injection 10 mL  10 mL Intracatheter PRN [name], MD   10 mL at 06/24/17 1309    REVIEW OF SYSTEMS:   Constitutional: Denies fevers, chills or abnormal night sweats  Eyes: Denies blurriness of  vision, double vision or watery eyes Ears, nose, mouth, throat, and face: Denies mucositis or sore throat Respiratory: Denies cough, dyspnea or wheezes Cardiovascular: Denies palpitation, chest discomfort (+) reduced bilateral feet swelling Gastrointestinal:  Denies nausea, heartburn or change in bowel habits Skin: Denies abnormal skin rashes (+) hair and nail are slowly growing  back since completing chemotherapy Lymphatics: Denies new lymphadenopathy or easy bruising Neurological:Denies numbness (+) Tingling in hands and feet have gotten better MSK: (+) cramping in leg Behavioral/Psych: Mood is stable, no new changes  All other systems were reviewed with the patient and are negative.  PHYSICAL EXAMINATION:  ECOG PERFORMANCE STATUS: 1  Vitals:   06/24/17 1010  BP: 136/76  Pulse: 91  Resp: 18  Temp: 98 F (36.7 C)   Filed Weights   06/24/17 1010  Weight: 235 lb 8 oz (106.8 kg)    GENERAL:alert, no distress and comfortable SKIN: skin color, texture, turgor are normal, no rashes or significant lesions EYES: normal, conjunctiva are pink and non-injected, sclera clear OROPHARYNX:no exudate, no erythema and lips, buccal mucosa, and tongue normal  NECK: supple, thyroid normal size, non-tender, without nodularity LYMPH:  no palpable lymphadenopathy in the cervical, axillary or inguinal LUNGS: clear to auscultation and percussion with normal breathing effort HEART: regular rate & rhythm and no murmurs  ABDOMEN:abdomen soft, non-tender and normal bowel sounds Musculoskeletal:no cyanosis of digits and no clubbing  PSYCH: alert & oriented x 3 with fluent speech NEURO: no focal motor/sensory deficits,  Breasts: Breast inspection showed left breast is surgically absent, surgical incision has healed well. Exam of the right breast and bilateral axillas reviewed no palpable mass or adenopathy.  LABORATORY DATA:  I have reviewed the data as listed CBC Latest Ref Rng & Units 06/24/2017 06/03/2017 05/12/2017  WBC 3.9 - 10.3 10e3/uL 5.1 8.0 8.7  Hemoglobin 11.6 - 15.9 g/dL 9.6(L) 9.7(L) 10.3(L)  Hematocrit 34.8 - 46.6 % 29.1(L) 29.0(L) 30.2(L)  Platelets 145 - 400 10e3/uL 260 257 327   CMP Latest Ref Rng & Units 06/24/2017 06/03/2017 05/12/2017  Glucose 70 - 140 mg/dl 161(H) 121 144(H)  BUN 7.0 - 26.0 mg/dL 15.6 38.2(H) 37.6(H)  Creatinine 0.6 - 1.1 mg/dL 0.9 1.2(H)  1.3(H)  Sodium 136 - 145 mEq/L 139 139 142  Potassium 3.5 - 5.1 mEq/L 4.1 3.6 4.9  Chloride 96 - 112 mEq/L - - -  CO2 22 - 29 mEq/L _0 Calcium 8.4 - 10.4 mg/dL 9.3 9.3 9.9  Total Protein 6.4 - 8.3 g/dL 7.3 8.0 8.0  Total Bilirubin 0.20 - 1.20 mg/dL 0.41 0.42 0.50  Alkaline Phos 40 - 150 U/L 68 68 72  AST 5 - 34 U/L 33 29 28  ALT 0 - 55 U/L 37 32 31    PATHOLOGY REPORT  Diagnosis 04/17/2016 Breast, left, needle core biopsy, inner mid breast - INVASIVE DUCTAL CARCINOMA WITH CALCIFICATIONS, SEE COMMENT. - DUCTAL CARCINOMA IN SITU WITH COMEDONECROSIS. Microscopic Comment While grading is best performed on the resection specimen the invasive carcinoma appears grade 2. Prognostic markers will be ordered and reported in an addendum. Dr. Lyndon Code has reviewed the case. The case was called to [facility] on 04/20/2016. Results: HER2 - *EQUIVOCAL* (Her2 by IHC will be ordered.) RATIO OF HER2/CEP17 SIGNALS 1.73 AVERAGE HER2 COPY NUMBER PER CELL 4.15 By immunohistochemistry, the tumor cells are positive for Her2 (3). Results: IMMUNOHISTOCHEMICAL AND MORPHOMETRIC ANALYSIS PERFORMED MANUALLY Estrogen Receptor: 100%, POSITIVE, STRONG STAINING INTENSITY Progesterone Receptor: 15%, POSITIVE, STRONG STAINING  INTENSITY Proliferation Marker Ki67: 10%  Diagnosis 05/15/2016 Lymph node, needle/core biopsy, right axilla - BENIGN REACTIVE LYMPH NODE. - NO GRANULOMAS OR MALIGNANCY IDENTIFIED. - SEE COMMENT. Microscopic Comment Internal departmental review obtained (Dr. Lyndon Code) with agreement. Results are phoned to [place] (05/18/16). (MEG:gt, 05/18/16)  Breast, simple mastectomy, Left 05/28/2016 - INVASIVE DUCTAL CARCINOMA, GRADE III/III, SPANNING 3.6 CM. - DUCTAL CARCINOMA IN SITU, HIGH GRADE. - LYMPHOVASCULAR INVASION IS IDENTIFIED. - THE SURGICAL RESECTION MARGINS ARE NEGATIVE FOR CARCINOMA. - SEE ONCOLOGY TABLE BELOW. Microscopic  Comment BREAST, INVASIVE TUMOR, WITHOUT LYMPH NODES PRESENT Specimen, including laterality : Left breast Procedure: Simple mastectomy Histologic type: Ductal with micropapillary features Grade: III Tubule formation: 3 Nuclear pleomorphism: 2 Mitotic: 3 Tumor size (gross measurement): 3.6 cm Margins: Greater than 0.2 cm to all margins Lymphovascular invasion: Present Ductal carcinoma in situ: Present Grade: High grade Extensive intraductal component: Yes Lobular neoplasia: Not identified Tumor focality: Unifocal Treatment effect: N/A Extent of tumor: Confined to breast parenchyma. Breast prognostic profile: [phone] Estrogen receptor: 100%, strong staining intensity Progesterone receptor: 15%, strong staining intensity Her 2 neu: Amplification was detected by immunohistochemistry. Ki-67: 10% 1 of 2 FINAL for TISHIA, MAESTRE ([id number]) Microscopic Comment(continued) Non-neoplastic breast: No significant findings. TNM: pT2, pNX (clinically recurrent). Comments: In addition to the main tumor, there is ductal carcinoma in situ present in random tissue submitted from the inferior lateral quadrant. (JBK:kh 06-01-16)  RADIOGRAPHIC STUDIES: I have personally reviewed the radiological images as listed and agreed with the findings in the report. No results found.   DG Chest 2 View 10/28/2016 IMPRESSION: No acute cardiopulmonary abnormality.  Bone Density 04/29/2017 DualFemur Neck Right 04/29/2017    65.9         -0.8    0.928 g/cm2 AP Spine  L1-L4      04/29/2017    65.9         1.7     1.406 g/cm2  Mammogram 04/29/2017 IMPRESSION: No mammographic evidence of malignancy. A result letter of this screening mammogram will be mailed directly to the patient.  ASSESSMENT & PLAN:  66 y.o. 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  1. Cancer of  central portion of left breast, pT2NxM0, stage IIA, G3, triple positive -I previously reviewed her surgical pathology findings in great details with patient and her family member. It showed a 3.6 cm mass, grade 3, surgical margins were negative. She had no sentinel lymph node biopsy due to her prior history of axillary lymph node dissection. -Her restaging CT scan of bone scan showed no evidence of distant metastasis, I reviewed with patient. -We again discussed the risk of cancer recurrence of this complete surgical resection. We reviewed that HER-2 positive with cancers are more aggressive, with increased risk of metastasis.  -she has completed adjuvant TCHP (docetaxel, carbotaxol, Herceptin, pejeta), every 3 weeks for total of 6 cycles, now on maintenance Herceptin every 3 weeks to complete 1 year treatment, which she will complete in 3 weeks  - her last echo was 5/18, EF normal  - I strongly encouraged her to start calcium, vitamin d and multi vitamins. -I discussed the role of adjuvant oral her2 antibody Neratinib which has shown and 2-3% 5 year disease-free survival benefit in HER-2 positive disease, particularly in hormonal sensitive disease. The treatment duration is 1 year. I also discussed the side effects in great detail, especially diarrhea and management,  especially in the first few months. Although manufacture recommend prophylactic Imodium, I recommend patient to use Imodium as needed, but start as soon as diarrhea starts. - I will give her paper information on it. She is interested, she will start 07/31/2017, after she completes Herceptin in mid August. -She is clinically doing well. Labs reviewed, Cr normal, slight anemia still. She is adequate for treatment today. We'll continue Herceptin maintenance therapy today, and she has one more treatment to complete.  -continue letrozole for a total of 7 years  -Continue breast cancer surveillance. She is clinically doing well, exam unremarkable,  last mammogram in May 2018 was negative. No clinical concern for recurrence. -will repeat ECHO in early 07/2017 -Will get port removed soon, will refer her back to surgeon Dr. Georgette Dover   2. Anemia  -Secondary to chemotherapy, her hemoglobin is 9.7, she has mild symptoms from anemia -Her H/H was normal before chemo  -Will continue to monitor.     3. DM and HTN  -She'll continue follow-up with her primary care physician -We again discussed her blood pressure and glucose level may be impacted by chemotherapy, and dexamethasone she takes before and after chemotherapy. -previously I strongly encouraged her to monitor her blood pressure and glucose at home, and consider increasing the dose of metformin and glyburide if needed -her BP and blood glucose has been normal/well controlled lately  4. Obesity  -I previously encouraged her to eat healthy and exercise regularly, she is willing to lose some weight after she completes chemotherapy.  5. Peripheral neuropathy, G1 -Secondary to chemotherapy, especially docetaxel -I previously encouraged her to take vitamin B complex. She does not feel she needs medication for now.  6. Fatigue  -Probably related to her previous chemotherapy and anemia -I previously encouraged her to gradually increase her exercise   7. Bone Health -We previously discussed that Letrozole can cause some bone weakening  -She previously has had a bone density scan in the past, but it was several years ago -repeated DEXA scan in 03/2017 showed normal bone density  -I encouraged her to take calcium and vitamin D. We'll monitor her Vit D levels.   Plan -Lab reviewed, she'll proceed Herceptin today, and continue every 3 weeks for one more doses to complete maintenance therapy. - continue Letrozole  -Prescribe Neratinib and will not start until 07/31/2017 -Lab, flush and f/u in Mind Sep  -Echo one week before visit  -I sent a message to Dr. Georgette Dover to remove her port    All  questions were answered. The patient knows to call the clinic with any problems, questions or concerns.  I spent 25 minutes counseling the patient face to face. The total time spent in the appointment was 30 minutes and more than 50% was on counseling.  This document serves as a record of services personally performed by [name], MD. It was created on her behalf by Joslyn Devon, a trained medical scribe. The creation of this record is based on the scribe's personal observations and the provider's statements to them. This document has been checked and approved by the attending provider.     I have reviewed the above documentation for accuracy and completeness and I agree with the above.   [name], MD 06/24/2017

## 2017-06-24 ENCOUNTER — Ambulatory Visit: Payer: Medicare PPO

## 2017-06-24 ENCOUNTER — Ambulatory Visit (HOSPITAL_BASED_OUTPATIENT_CLINIC_OR_DEPARTMENT_OTHER): Payer: Medicare PPO

## 2017-06-24 ENCOUNTER — Ambulatory Visit (HOSPITAL_BASED_OUTPATIENT_CLINIC_OR_DEPARTMENT_OTHER): Payer: Medicare PPO | Admitting: Hematology

## 2017-06-24 ENCOUNTER — Other Ambulatory Visit (HOSPITAL_BASED_OUTPATIENT_CLINIC_OR_DEPARTMENT_OTHER): Payer: Medicare PPO

## 2017-06-24 ENCOUNTER — Encounter: Payer: Self-pay | Admitting: Hematology

## 2017-06-24 VITALS — BP 136/76 | HR 91 | Temp 98.0°F | Resp 18 | Ht 69.0 in | Wt 235.5 lb

## 2017-06-24 DIAGNOSIS — E119 Type 2 diabetes mellitus without complications: Secondary | ICD-10-CM | POA: Diagnosis not present

## 2017-06-24 DIAGNOSIS — C50112 Malignant neoplasm of central portion of left female breast: Secondary | ICD-10-CM

## 2017-06-24 DIAGNOSIS — G620 Drug-induced polyneuropathy: Secondary | ICD-10-CM

## 2017-06-24 DIAGNOSIS — Z95828 Presence of other vascular implants and grafts: Secondary | ICD-10-CM

## 2017-06-24 DIAGNOSIS — Z853 Personal history of malignant neoplasm of breast: Secondary | ICD-10-CM | POA: Diagnosis not present

## 2017-06-24 DIAGNOSIS — Z5112 Encounter for antineoplastic immunotherapy: Secondary | ICD-10-CM | POA: Diagnosis not present

## 2017-06-24 DIAGNOSIS — Z170 Estrogen receptor positive status [ER+]: Secondary | ICD-10-CM | POA: Diagnosis not present

## 2017-06-24 DIAGNOSIS — D6481 Anemia due to antineoplastic chemotherapy: Secondary | ICD-10-CM

## 2017-06-24 DIAGNOSIS — I10 Essential (primary) hypertension: Secondary | ICD-10-CM | POA: Diagnosis not present

## 2017-06-24 LAB — COMPREHENSIVE METABOLIC PANEL
ALT: 37 U/L (ref 0–55)
AST: 33 U/L (ref 5–34)
Albumin: 3.6 g/dL (ref 3.5–5.0)
Alkaline Phosphatase: 68 U/L (ref 40–150)
Anion Gap: 10 mEq/L (ref 3–11)
BUN: 15.6 mg/dL (ref 7.0–26.0)
CO2: 23 mEq/L (ref 22–29)
Calcium: 9.3 mg/dL (ref 8.4–10.4)
Chloride: 106 mEq/L (ref 98–109)
Creatinine: 0.9 mg/dL (ref 0.6–1.1)
EGFR: 83 mL/min/{1.73_m2} — ABNORMAL LOW (ref >90)
Glucose: 161 mg/dl — ABNORMAL HIGH (ref 70–140)
Potassium: 4.1 mEq/L (ref 3.5–5.1)
Sodium: 139 mEq/L (ref 136–145)
Total Bilirubin: 0.41 mg/dL (ref 0.20–1.20)
Total Protein: 7.3 g/dL (ref 6.4–8.3)

## 2017-06-24 LAB — CBC WITH DIFFERENTIAL/PLATELET
BASO%: 0.2 % (ref 0.0–2.0)
Basophils Absolute: 0 10*3/uL (ref 0.0–0.1)
EOS%: 3.4 % (ref 0.0–7.0)
Eosinophils Absolute: 0.2 10*3/uL (ref 0.0–0.5)
HCT: 29.1 % — ABNORMAL LOW (ref 34.8–46.6)
HGB: 9.6 g/dL — ABNORMAL LOW (ref 11.6–15.9)
LYMPH%: 34.7 % (ref 14.0–49.7)
MCH: 33.9 pg (ref 25.1–34.0)
MCHC: 33 g/dL (ref 31.5–36.0)
MCV: 102.8 fL — ABNORMAL HIGH (ref 79.5–101.0)
MONO#: 0.3 10*3/uL (ref 0.1–0.9)
MONO%: 6.1 % (ref 0.0–14.0)
NEUT#: 2.8 10*3/uL (ref 1.5–6.5)
NEUT%: 55.6 % (ref 38.4–76.8)
Platelets: 260 10*3/uL (ref 145–400)
RBC: 2.83 10*6/uL — ABNORMAL LOW (ref 3.70–5.45)
RDW: 13.1 % (ref 11.2–14.5)
WBC: 5.1 10*3/uL (ref 3.9–10.3)
lymph#: 1.8 10*3/uL (ref 0.9–3.3)

## 2017-06-24 MED ORDER — SODIUM CHLORIDE 0.9% FLUSH
10.0000 mL | INTRAVENOUS | Status: DC | PRN
  Administered 2017-06-24: 10 mL
  Filled 2017-06-24: qty 10

## 2017-06-24 MED ORDER — ACETAMINOPHEN 325 MG PO TABS
650.0000 mg | ORAL_TABLET | Freq: Once | ORAL | Status: AC
  Administered 2017-06-24: 650 mg via ORAL

## 2017-06-24 MED ORDER — TRASTUZUMAB CHEMO 150 MG IV SOLR
650.0000 mg | Freq: Once | INTRAVENOUS | Status: AC
  Administered 2017-06-24: 650 mg via INTRAVENOUS
  Filled 2017-06-24: qty 30.95

## 2017-06-24 MED ORDER — NERATINIB MALEATE 40 MG PO TABS: 240.0000 mg | ORAL_TABLET | Freq: Every day | ORAL | 1 refills | Status: DC

## 2017-06-24 MED ORDER — DIPHENHYDRAMINE HCL 25 MG PO CAPS
25.0000 mg | ORAL_CAPSULE | Freq: Once | ORAL | Status: AC
  Administered 2017-06-24: 25 mg via ORAL

## 2017-06-24 MED ORDER — HEPARIN SOD (PORK) LOCK FLUSH 100 UNIT/ML IV SOLN
500.0000 [IU] | Freq: Once | INTRAVENOUS | Status: AC | PRN
  Administered 2017-06-24: 500 [IU]
  Filled 2017-06-24: qty 5

## 2017-06-24 MED ORDER — DIPHENHYDRAMINE HCL 25 MG PO CAPS
ORAL_CAPSULE | ORAL | Status: AC
  Filled 2017-06-24: qty 1

## 2017-06-24 MED ORDER — ACETAMINOPHEN 325 MG PO TABS
ORAL_TABLET | ORAL | Status: AC
  Filled 2017-06-24: qty 2

## 2017-06-24 MED ORDER — SODIUM CHLORIDE 0.9 % IV SOLN
Freq: Once | INTRAVENOUS | Status: AC
  Administered 2017-06-24: 11:00:00 via INTRAVENOUS

## 2017-06-24 MED ORDER — SODIUM CHLORIDE 0.9 % IJ SOLN
10.0000 mL | INTRAMUSCULAR | Status: DC | PRN
  Administered 2017-06-24: 10 mL via INTRAVENOUS
  Filled 2017-06-24: qty 10

## 2017-06-24 NOTE — Patient Instructions (Signed)
Trastuzumab injection for infusion What is this medicine? TRASTUZUMAB (tras TOO zoo mab) is a monoclonal antibody. It is used to treat breast cancer and stomach cancer. This medicine may be used for other purposes; ask your health care provider or pharmacist if you have questions. COMMON BRAND NAME(S): Herceptin What should I tell my health care provider before I take this medicine? They need to know if you have any of these conditions: -heart disease -heart failure -lung or breathing disease, like asthma -an unusual or allergic reaction to trastuzumab, benzyl alcohol, or other medications, foods, dyes, or preservatives -pregnant or trying to get pregnant -breast-feeding How should I use this medicine? This drug is given as an infusion into a vein. It is administered in a hospital or clinic by a specially trained health care professional. Talk to your pediatrician regarding the use of this medicine in children. This medicine is not approved for use in children. Overdosage: If you think you have taken too much of this medicine contact a poison control center or emergency room at once. NOTE: This medicine is only for you. Do not share this medicine with others. What if I miss a dose? It is important not to miss a dose. Call your doctor or health care professional if you are unable to keep an appointment. What may interact with this medicine? This medicine may interact with the following medications: -certain types of chemotherapy, such as daunorubicin, doxorubicin, epirubicin, and idarubicin This list may not describe all possible interactions. Give your health care provider a list of all the medicines, herbs, non-prescription drugs, or dietary supplements you use. Also tell them if you smoke, drink alcohol, or use illegal drugs. Some items may interact with your medicine. What should I watch for while using this medicine? Visit your doctor for checks on your progress. Report any side effects.  Continue your course of treatment even though you feel ill unless your doctor tells you to stop. Call your doctor or health care professional for advice if you get a fever, chills or sore throat, or other symptoms of a cold or flu. Do not treat yourself. Try to avoid being around people who are sick. You may experience fever, chills and shaking during your first infusion. These effects are usually mild and can be treated with other medicines. Report any side effects during the infusion to your health care professional. Fever and chills usually do not happen with later infusions. Do not become pregnant while taking this medicine or for 7 months after stopping it. Women should inform their doctor if they wish to become pregnant or think they might be pregnant. Women of child-bearing potential will need to have a negative pregnancy test before starting this medicine. There is a potential for serious side effects to an unborn child. Talk to your health care professional or pharmacist for more information. Do not breast-feed an infant while taking this medicine or for 7 months after stopping it. Women must use effective birth control with this medicine. What side effects may I notice from receiving this medicine? Side effects that you should report to your doctor or health care professional as soon as possible: -allergic reactions like skin rash, itching or hives, swelling of the face, lips, or tongue -chest pain or palpitations -cough -dizziness -feeling faint or lightheaded, falls -fever -general ill feeling or flu-like symptoms -signs of worsening heart failure like breathing problems; swelling in your legs and feet -unusually weak or tired Side effects that usually do not require medical   attention (report to your doctor or health care professional if they continue or are bothersome): -bone pain -changes in taste -diarrhea -joint pain -nausea/vomiting -weight loss This list may not describe all  possible side effects. Call your doctor for medical advice about side effects. You may report side effects to FDA at 1-800-FDA-1088. Where should I keep my medicine? This drug is given in a hospital or clinic and will not be stored at home. NOTE: This sheet is a summary. It may not cover all possible information. If you have questions about this medicine, talk to your doctor, pharmacist, or health care provider.  2018 Elsevier/Gold Standard (2016-11-10 14:37:52)  

## 2017-06-24 NOTE — Patient Instructions (Signed)

## 2017-06-25 ENCOUNTER — Ambulatory Visit: Payer: Self-pay | Admitting: Surgery

## 2017-06-25 ENCOUNTER — Telehealth: Payer: Self-pay | Admitting: Pharmacist

## 2017-06-25 DIAGNOSIS — C50112 Malignant neoplasm of central portion of left female breast: Secondary | ICD-10-CM

## 2017-06-25 MED ORDER — NERATINIB MALEATE 40 MG PO TABS: 240.0000 mg | ORAL_TABLET | Freq: Every day | ORAL | 1 refills | Status: DC

## 2017-06-25 NOTE — Telephone Encounter (Signed)
Oral Oncology Pharmacist Encounter  Received new prescription for Nerlynx for the maintenance adjuvant treatment of hormone-receptor positive, Her-2 positive breast cancer after completion of 1 year of adjuvant Herceptin (last dose planned for 07/15/17), planned duration 1 year.  Labs from 06/24/17 assessed, OK for treatment.  Current medication list in Epic reviewed, no significant DDIs with Nerlynx identified:  Prescription has been e-scribed to the [facility] for benefits analysis and approval.  Oral Oncology Clinic will continue to follow for insurance authorization, copayment issues, initial counseling and start date.  Johny Drilling, PharmD, BCPS, BCOP 06/25/2017 2:58 PM Oral Oncology Clinic [phone]

## 2017-06-28 ENCOUNTER — Telehealth: Payer: Self-pay | Admitting: Pharmacy Technician

## 2017-06-28 NOTE — Telephone Encounter (Signed)
Oral Oncology Patient Advocate Encounter  Prior Authorization for Nerlynx has been approved.    PA# [id number] Effective dates: 06/28/2017 through 06/28/2018  Oral Oncology Clinic will continue to follow.   Fabio Asa. Melynda Keller, [place] Oral Oncology Patient Advocate [phone] 06/28/2017 4:22 PM

## 2017-06-28 NOTE — Telephone Encounter (Signed)
Oral Oncology Patient Advocate Encounter  Received notification from [facility] that prior authorization for Nerlynx is required.  PA submitted on CoverMyMeds Key [id number] Status is pending  Oral Oncology Clinic will continue to follow.  Fabio Asa. Melynda Keller, [place] [facility] Patient Advocate [phone] 06/28/2017 12:03 PM

## 2017-07-01 ENCOUNTER — Ambulatory Visit (HOSPITAL_COMMUNITY)
Admission: RE | Admit: 2017-07-01 | Discharge: 2017-07-01 | Disposition: A | Discharge status: Home / Self Care | Payer: Medicare PPO | Source: Ambulatory Visit | Attending: Hematology | Admitting: Hematology

## 2017-07-01 DIAGNOSIS — C50112 Malignant neoplasm of central portion of left female breast: Secondary | ICD-10-CM | POA: Diagnosis present

## 2017-07-01 DIAGNOSIS — I5030 Unspecified diastolic (congestive) heart failure: Secondary | ICD-10-CM | POA: Insufficient documentation

## 2017-07-01 DIAGNOSIS — I360 Nonrheumatic tricuspid (valve) stenosis: Secondary | ICD-10-CM

## 2017-07-01 DIAGNOSIS — I420 Dilated cardiomyopathy: Secondary | ICD-10-CM | POA: Insufficient documentation

## 2017-07-01 DIAGNOSIS — I081 Rheumatic disorders of both mitral and tricuspid valves: Secondary | ICD-10-CM | POA: Diagnosis not present

## 2017-07-01 NOTE — Progress Notes (Signed)
  Echocardiogram 2D Echocardiogram has been performed.  Lindsey Marquez 07/01/2017, 10:07 AM

## 2017-07-08 MED ORDER — NERATINIB MALEATE 40 MG PO TABS: 240.0000 mg | ORAL_TABLET | Freq: Every day | ORAL | 1 refills | Status: DC

## 2017-07-08 NOTE — Addendum Note (Signed)
Addended by: Enis Gash on: 07/08/2017 09:32 AM   Modules accepted: Orders

## 2017-07-08 NOTE — Telephone Encounter (Signed)
Oral Chemotherapy Pharmacist Encounter  Received notification that the [facility] does not have access to dispense Nerlynx at this time. Prescription e-scribed to [facility] in [place], [place] (ph: [phone]). We will follow-up with patient when she is in infusion on 8/16 and update on prescription status, confirm start date at that time.  Oral Oncology Clinic will continue to follow.  Johny Drilling, PharmD, BCPS, BCOP 07/08/2017  9:10 AM Oral Oncology Clinic [phone]

## 2017-07-12 ENCOUNTER — Telehealth: Payer: Self-pay | Admitting: Pharmacist

## 2017-07-12 NOTE — Telephone Encounter (Signed)
Oral Chemotherapy Pharmacist Encounter  Received notification from [facility] that patient's Nerlynx prescription would be shipping from the pharmacy for delivery at the patient's home on 07/13/17. Copayment $0  I called and LVM for patient with phone number to Oral Oncology Clinic and to NOT start Nerlynx until after we are able to speak. Patient in infusion for last dose of adjuvant Herceptin on 07/15/17 at [facility]. Per 06/24/17 MD note, anticipated Nerlynx start date 07/31/17.  I will plan to speak with patient for Nerlynx initial counseling on 8/16 during Herceptin infusion.  Oral Oncology Clinic will continue to follow.  Thank you,  Johny Drilling, PharmD, BCPS, BCOP 07/12/2017  10:18 AM Oral Oncology Clinic [phone]

## 2017-07-15 ENCOUNTER — Other Ambulatory Visit: Payer: Medicare PPO

## 2017-07-15 ENCOUNTER — Ambulatory Visit (HOSPITAL_BASED_OUTPATIENT_CLINIC_OR_DEPARTMENT_OTHER): Payer: Medicare PPO

## 2017-07-15 ENCOUNTER — Ambulatory Visit: Payer: Medicare PPO | Admitting: Hematology

## 2017-07-15 ENCOUNTER — Other Ambulatory Visit (HOSPITAL_BASED_OUTPATIENT_CLINIC_OR_DEPARTMENT_OTHER): Payer: Medicare PPO

## 2017-07-15 ENCOUNTER — Ambulatory Visit: Payer: Medicare PPO

## 2017-07-15 VITALS — BP 132/59 | HR 82 | Temp 98.8°F | Resp 16

## 2017-07-15 DIAGNOSIS — C50112 Malignant neoplasm of central portion of left female breast: Secondary | ICD-10-CM

## 2017-07-15 DIAGNOSIS — Z5112 Encounter for antineoplastic immunotherapy: Secondary | ICD-10-CM

## 2017-07-15 LAB — CBC WITH DIFFERENTIAL/PLATELET
BASO%: 0.5 % (ref 0.0–2.0)
Basophils Absolute: 0 10*3/uL (ref 0.0–0.1)
EOS%: 2.4 % (ref 0.0–7.0)
Eosinophils Absolute: 0.2 10*3/uL (ref 0.0–0.5)
HCT: 31.9 % — ABNORMAL LOW (ref 34.8–46.6)
HGB: 10.9 g/dL — ABNORMAL LOW (ref 11.6–15.9)
LYMPH%: 34.4 % (ref 14.0–49.7)
MCH: 34.7 pg — ABNORMAL HIGH (ref 25.1–34.0)
MCHC: 34.1 g/dL (ref 31.5–36.0)
MCV: 102 fL — ABNORMAL HIGH (ref 79.5–101.0)
MONO#: 0.6 10*3/uL (ref 0.1–0.9)
MONO%: 7.2 % (ref 0.0–14.0)
NEUT#: 4.3 10*3/uL (ref 1.5–6.5)
NEUT%: 55.5 % (ref 38.4–76.8)
Platelets: 312 10*3/uL (ref 145–400)
RBC: 3.12 10*6/uL — ABNORMAL LOW (ref 3.70–5.45)
RDW: 13.4 % (ref 11.2–14.5)
WBC: 7.8 10*3/uL (ref 3.9–10.3)
lymph#: 2.7 10*3/uL (ref 0.9–3.3)

## 2017-07-15 LAB — COMPREHENSIVE METABOLIC PANEL
ALT: 37 U/L (ref 0–55)
AST: 34 U/L (ref 5–34)
Albumin: 3.8 g/dL (ref 3.5–5.0)
Alkaline Phosphatase: 72 U/L (ref 40–150)
Anion Gap: 13 mEq/L — ABNORMAL HIGH (ref 3–11)
BUN: 40.3 mg/dL — ABNORMAL HIGH (ref 7.0–26.0)
CO2: 24 mEq/L (ref 22–29)
Calcium: 9.4 mg/dL (ref 8.4–10.4)
Chloride: 102 mEq/L (ref 98–109)
Creatinine: 1.4 mg/dL — ABNORMAL HIGH (ref 0.6–1.1)
EGFR: 46 mL/min/{1.73_m2} — ABNORMAL LOW (ref >90)
Glucose: 166 mg/dl — ABNORMAL HIGH (ref 70–140)
Potassium: 4.2 mEq/L (ref 3.5–5.1)
Sodium: 139 mEq/L (ref 136–145)
Total Bilirubin: 0.48 mg/dL (ref 0.20–1.20)
Total Protein: 7.9 g/dL (ref 6.4–8.3)

## 2017-07-15 MED ORDER — DIPHENHYDRAMINE HCL 25 MG PO CAPS
ORAL_CAPSULE | ORAL | Status: AC
  Filled 2017-07-15: qty 1

## 2017-07-15 MED ORDER — ACETAMINOPHEN 325 MG PO TABS
650.0000 mg | ORAL_TABLET | Freq: Once | ORAL | Status: AC
  Administered 2017-07-15: 650 mg via ORAL

## 2017-07-15 MED ORDER — ACETAMINOPHEN 325 MG PO TABS
ORAL_TABLET | ORAL | Status: AC
  Filled 2017-07-15: qty 2

## 2017-07-15 MED ORDER — TRASTUZUMAB CHEMO 150 MG IV SOLR
650.0000 mg | Freq: Once | INTRAVENOUS | Status: AC
  Administered 2017-07-15: 650 mg via INTRAVENOUS
  Filled 2017-07-15: qty 30.95

## 2017-07-15 MED ORDER — DIPHENHYDRAMINE HCL 25 MG PO CAPS
25.0000 mg | ORAL_CAPSULE | Freq: Once | ORAL | Status: AC
  Administered 2017-07-15: 25 mg via ORAL

## 2017-07-15 MED ORDER — SODIUM CHLORIDE 0.9 % IV SOLN
Freq: Once | INTRAVENOUS | Status: AC
  Administered 2017-07-15: 09:00:00 via INTRAVENOUS

## 2017-07-15 MED ORDER — HEPARIN SOD (PORK) LOCK FLUSH 100 UNIT/ML IV SOLN
500.0000 [IU] | Freq: Once | INTRAVENOUS | Status: AC | PRN
  Administered 2017-07-15: 500 [IU]
  Filled 2017-07-15: qty 5

## 2017-07-15 MED ORDER — SODIUM CHLORIDE 0.9% FLUSH
10.0000 mL | INTRAVENOUS | Status: DC | PRN
  Administered 2017-07-15: 10 mL
  Filled 2017-07-15: qty 10

## 2017-07-15 NOTE — Patient Instructions (Signed)
[facility] Discharge Instructions for Patients Receiving Chemotherapy  Today you received the following chemotherapy agents herceptin   To help prevent nausea and vomiting after your treatment, we encourage you to take your nausea medication as directed   If you develop nausea and vomiting that is not controlled by your nausea medication, call the clinic.   BELOW ARE SYMPTOMS THAT SHOULD BE REPORTED IMMEDIATELY:  *FEVER GREATER THAN 100.5 F  *CHILLS WITH OR WITHOUT FEVER  NAUSEA AND VOMITING THAT IS NOT CONTROLLED WITH YOUR NAUSEA MEDICATION  *UNUSUAL SHORTNESS OF BREATH  *UNUSUAL BRUISING OR BLEEDING  TENDERNESS IN MOUTH AND THROAT WITH OR WITHOUT PRESENCE OF ULCERS  *URINARY PROBLEMS  *BOWEL PROBLEMS  UNUSUAL RASH Items with * indicate a potential emergency and should be followed up as soon as possible.  Feel free to call the clinic you have any questions or concerns. The clinic phone number is [phone].  

## 2017-07-15 NOTE — Telephone Encounter (Signed)
Oral Chemotherapy Pharmacist Encounter   I spoke with patient in infusion room for overview of new oral chemotherapy medication: Nerlynx for the maintenance adjuvant treatment of hormone-receptor positive, Her-2 positive breast cancer after completion of 1 year of adjuvant Herceptin (last dose today 07/15/17), planned duration 1 year.   Pt is doing well. The prescription is being filled through [facility] as Alcide Clever is a limited distribution medication. Copayment $0 It was delivered to patient's house on 07/13/17.  Counseled patient on administration, dosing, side effects, safe handling, and monitoring. Patient will take Nerlynx 76m tablets, 6 tablets 2463m by mouth once daily, with food, for a total of 1 year of therapy with Nerlynx. Patient would like to wait until she sees Dr. FeBurr Medicoack in the office on 08/13/17 prior to starting Nerlynx.  Side effects include but not limited to: diarrhea and faigue.    Reviewed with patient importance of keeping a medication schedule and plan for any missed doses.  Gave patient written instructions for anti-diarrheal prophylaxis with loperamide and instructions of how to reach clinic if needed. Patient knows to avoid acid suppressing medication and will call the office if medication for reflux is required while she is on therapy with Nerlynx  Ms. Diperna voiced understanding and appreciation.   All questions answered.  Patient knows to call the office with questions or concerns. Oral Oncology Clinic will continue to follow.  Thank you,  JeJohny DrillingPharmD, BCPS, BCOP 07/15/2017  12:19 PM Oral Oncology Clinic [phone]

## 2017-07-16 ENCOUNTER — Ambulatory Visit (AMBULATORY_SURGERY_CENTER): Payer: Self-pay | Admitting: *Deleted

## 2017-07-16 VITALS — Ht 67.0 in | Wt 236.2 lb

## 2017-07-16 DIAGNOSIS — Z86010 Personal history of colonic polyps: Secondary | ICD-10-CM

## 2017-07-16 MED ORDER — NA SULFATE-K SULFATE-MG SULF 17.5-3.13-1.6 GM/177ML PO SOLN: ORAL | 0 refills | Status: DC

## 2017-07-16 NOTE — Progress Notes (Signed)
No diet medications taken  No egg or soy allergies  No home oxygen or hx of sleep apnea  No intubation or anesthesia problems per pt

## 2017-07-21 ENCOUNTER — Other Ambulatory Visit: Payer: Self-pay | Admitting: Internal Medicine

## 2017-07-22 ENCOUNTER — Telehealth: Payer: Self-pay | Admitting: *Deleted

## 2017-07-22 NOTE — Telephone Encounter (Signed)
-----   Message from [name], MD sent at 07/18/2017 10:21 AM EDT ----- Please talk pt and let her know that she has mild intermittent elevated Cr, and make sure she is drink water adequately. She is going to start Neratinib, diarrhea is very common side effect, and dehydration can cause worsening renal function. Let her call us in 2-3 days after starting Neratinib and update [place].  Thanks  [name]  07/18/2017

## 2017-07-22 NOTE — Telephone Encounter (Signed)
Left message for pt to return call tomorrow to discuss information from Dr Burr Medico.

## 2017-07-23 NOTE — Telephone Encounter (Signed)
Talked with pt per attached message. She is drinking about 32-48 oz water, instructed her to drink 64 oz water per day. Also to call us with update. She verified 9/14 appts.

## 2017-07-30 ENCOUNTER — Encounter: Payer: Medicare PPO | Admitting: Gastroenterology

## 2017-08-04 LAB — HM DIABETES EYE EXAM

## 2017-08-11 NOTE — Progress Notes (Signed)
[place]  [phone]) [phone] [phone]) [phone]  Clinic Follow Up Note   Patient Care Team: [name], NP as PCP - General (Internal Medicine) [name], MD as Consulting Physician (General Surgery) 08/13/2017   CHIEF COMPLAINTS:  Follow up recurrent left breast cancer   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 [facility])   Staging form: Breast, AJCC 7th Edition     Clinical stage from 04/17/2016: Stage IA (T1c, N0, M0) - Signed by [name], MD on 05/07/2016     Pathologic stage from 05/28/2016: Stage IIA (T2, N0, cM0) - Signed by [name], MD on 06/11/2016 History of left breast cancer   Staging form: Breast, AJCC 7th Edition     Clinical: Stage IIIB (T4d, N1, cM0) - Signed by [name], MD on 12/14/2013     Pathologic: Stage IIIB (T4d, N1a, cM0) - Signed by [name], MD on 12/14/2013       History of left breast cancer   09/12/2002 Imaging    CT scan show no evidence of disease apart from the left axilla      09/2002 -  Neo-Adjuvant Chemotherapy    TAC X4 cycles       01/10/2003 Surgery    The patient had left lumpectomy and axillary lymph node dissection which show residual breast cancer and a sentinel lymph node was positive      2004 -  Adjuvant Chemotherapy    Cytoxan with 5-FU x4 cycles (methotrexate added at cycle 4).      2004 -  Radiation Therapy    Adjuvant left breast radiation      12/2002 Receptors her2    ER, PR and HER-2 were all negative.       Cancer of central portion of left breast ([place])   04/13/2016 Mammogram    Scheduler coarse heterogeneous calcifications spanning an area of 3.7 cm in the lumpectomy site, indeterminate. Ultrasound of the left axilla was negative.      04/17/2016 Initial Diagnosis    Cancer of central portion of left breast ([place])      04/17/2016 Initial Biopsy    Left breast core needle biopsy showed invasive and in situ ductal carcinoma with calcification, grade 2.      04/17/2016 Receptors her2    Your 100% positive, PR 15% positive, strong staining, Ki-67 10%, HER-2 positive by IHC (3)      05/05/2016 Imaging    Bilateral breast MRI showed a 1.3 x 0.8 x 0.7 cm biopsy-proven ductal carcinoma in the region of the previous lumpectomy. Enlarged lobulated right inferior axillary lymph node with diffuse cortical thickening, biopsy is recommended.      05/15/2016 Pathology Results    right axilary node biopsy was negative for malignant cell       05/28/2016 Surgery    Simple left mastectomy      05/28/2016 Pathology Results    Left mastectomy showed invasive ductal carcinoma, grade 3, 3.6 cm, high grade DCIS, (+) LVI, surgical margins were negative. Tumor 3.6 cm, no lymph nodes identified.      06/25/2016 Imaging    Staging CT scan of the chest, abdomen and pelvis with contrast and a bone scan showed no evidence of metastasis. Postoperative fluid collection in the left breast, likely a seroma or hematoma. Right axillary adenopathy, which was biopsied previously.       07/07/2016 - 07/15/2017 Chemotherapy    Adjuvant chemotherapy with docetaxel, carboplatin, Herceptin and pejeta  every 3 weeks, for 6 cycles, followed by Herceptin maintenance therapy for total of one year treatment  Ended [facility] 11/04/16        12/15/2016 -  Anti-estrogen oral therapy    Adjuvant letrozole 2.5 mg once daily      04/29/2017 Mammogram     Mammogram 04/29/2017 IMPRESSION: No mammographic evidence of malignancy. A result letter of this screening mammogram will be mailed directly to the patient.          HISTORY OF PRESENTING ILLNESS: 05/08/16 [name] 66 y.o. female is here because of her recurrent left breast cancer.  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 She was seen by multiple medical oncologist in our practice in the past, last  was seen by Dr. Alvy Bimler in 11/2013.  She has been doing well, and is compliant with annual screening mammogram. The screening mammogram on 04/17/2016 showed calcification in the left breast, and core needle biopsy showed invasive and in situ ductal carcinoma, grade 2, ER/PR positive, HER-2 amplified. She was referred to seeing breast surgeon Dr. Georgette Dover and referred back to [place].   She feels well, she did not feel any lump in her breast or axillas latley. She denies any pain, dyspnea, or GI symptoms. She had right nipple rash and right axillary swelling in the past one week, after she drinks some diet tea with citric. Breast MRI showed an enlarged lobulated right inferior axillary lymph node with diffuse cortical thickening, in addition to the known left breast mass which measured 1.3 cm on MRI.  GYN HISTORY  Menarchal: 12 LMP: 2003 Contraceptive: 20 HRT: n/a  G2P2: 24 yo Son and 79 yo son who died   CURRENT THERAPY:  Letrozole 2.5 mg daily, started on 12/15/2016, Nerlynx to start 08/26/17 approximately    INTERIM HISTORY: Ms. newbrough returns for follow up. She presents to the clinic today with her daughter. She had to get her blood redrawn in clincic because it was too thick.  She reports to doing well. She has not tried her new medication when she gets her port taken out. Her port will be removed next week.  She uses letrozole and reports to getting joint aches badly (4/10). She is able to function. She does take calcium.  She reports to having an episode of diarrhea in august. Her BM are usually regularly formed.    MEDICAL HISTORY:  Past Medical History:  Diagnosis Date  . Blood transfusion without reported diagnosis   . Breast cancer [facility]) August 2002   Invasive ductal carcinoma Left breast. 2002, 2017  . Diabetes mellitus without complication ([place])   . Family history of malignant neoplasm of ovary   . Family history of pancreatic cancer   . Hypertension     SURGICAL HISTORY: Past  Surgical History:  Procedure Laterality Date  . BREAST SURGERY Left 2003  . COLONOSCOPY    . FOOT SURGERY  2000  . MASTECTOMY Left 05/28/2016  . port a cath insertion  05/28/2016  . PORTACATH PLACEMENT Right 05/28/2016   Procedure: INSERTION PORT-A-CATH;  Surgeon: [name], MD;  Location: [facility];  Service: General;  Laterality: Right;  . TOTAL MASTECTOMY Left 05/28/2016   Procedure: LEFT  MASTECTOMY;  Surgeon: [name], MD;  Location: [facility];  Service: General;  Laterality: Left;  . TUBAL LIGATION  22 yrs since  2004.   Bilateral.  SOCIAL HISTORY: Social History   Social History  . Marital status: Divorced    Spouse name: N/A  . Number of children: N/A  . Years of education: N/A   Occupational History  . Not on file.   Social History Main Topics  . Smoking status: Former Smoker    Packs/day: 0.10    Years: 7.00    Quit date: 12/01/1975  . Smokeless tobacco: Never Used  . Alcohol use No  . Drug use: No  . Sexual activity: Yes   Other Topics Concern  . Not on file   Social History Narrative  . No narrative on file   She is retired from school   FAMILY HISTORY: Family History  Problem Relation Age of Onset  . Prostate cancer Father   . Ovarian cancer Maternal Aunt 62       deceased  . Pancreatic cancer Mother 63       deceased  . Cancer Maternal Aunt        2 other mat aunts; unk. primary in 60s; deceased  . Cancer Maternal Uncle        "bone ca"; unk. primary; deceased 33s  . Prostate cancer Maternal Uncle        deceased 56  . Colon cancer Neg Hx   . Esophageal cancer Neg Hx   . Rectal cancer Neg Hx   . Stomach cancer Neg Hx     ALLERGIES:  is allergic to codeine.  MEDICATIONS:  Current Outpatient Prescriptions  Medication Sig Dispense Refill  . aspirin 81 MG tablet Take 81 mg by mouth daily.     Marland Kitchen glucose blood (TRUE METRIX BLOOD GLUCOSE TEST) test strip 1 each by Other route 3 (three) times daily. Use as instructed 600 each 2  .  glyBURIDE-metformin (GLUCOVANCE) 2.5-500 MG tablet Take 2 tablets by mouth 2 (two) times daily with a meal. 360 tablet 1  . ibuprofen (ADVIL,MOTRIN) 200 MG tablet Take 200 mg by mouth every 8 (eight) hours as needed.      Marland Kitchen letrozole (FEMARA) 2.5 MG tablet Take 1 tablet (2.5 mg total) by mouth daily. 30 tablet 3  . lidocaine-prilocaine (EMLA) cream Apply to affected area once 30 g 3  . lisinopril-hydrochlorothiazide (PRINZIDE,ZESTORETIC) 20-25 MG tablet TAKE 1 TABLET BY MOUTH DAILY. 90 tablet 0  . Neratinib Maleate (NERLYNX) 40 MG tablet Take 6 tablets (240 mg total) by mouth daily. Take with food. 180 tablet 1  . Omega-3 Fatty Acids (FISH OIL) 1200 MG CAPS Take 1 capsule by mouth daily.    . Potassium Chloride ER 20 MEQ TBCR Take 40 mEq by mouth daily. 60 tablet 1  . torsemide (DEMADEX) 20 MG tablet Take 20 mg by mouth daily.    . TRUEPLUS LANCETS 33G MISC 1 each by Does not apply route 3 (three) times daily. 600 each 2   No current facility-administered medications for this visit.    Facility-Administered Medications Ordered in Other Visits  Medication Dose Route Frequency Provider Last Rate Last Dose  . sodium chloride 0.9 % injection 10 mL  10 mL Intravenous PRN [name], MD   10 mL at 02/16/17 1004  . sodium chloride flush (NS) 0.9 % injection 10 mL  10 mL Intracatheter PRN [name], MD   10 mL at 06/24/17 1309    REVIEW OF SYSTEMS:   Constitutional: Denies fevers, chills or abnormal night sweats  Eyes: Denies blurriness of vision, double vision or watery eyes Ears, nose, mouth, throat,  and face: Denies mucositis or sore throat Respiratory: Denies cough, dyspnea or wheezes Cardiovascular: Denies palpitation, chest discomfort (+) reduced bilateral feet swelling Gastrointestinal:  Denies nausea, heartburn or change in bowel habits Skin: Denies abnormal skin rashes  Lymphatics: Denies new lymphadenopathy or easy bruising Neurological:Denies numbness (+) Tingling in hands and feet have  gotten better MSK: (+) cramping in leg (+) joint aches (4/10) Behavioral/Psych: Mood is stable, no new changes  All other systems were reviewed with the patient and are negative.  PHYSICAL EXAMINATION:  ECOG PERFORMANCE STATUS: 1  Vitals:   08/13/17 1008  BP: (!) 153/73  Pulse: 88  Resp: 18  Temp: 98.7 F (37.1 C)  SpO2: 100%   Filed Weights   08/13/17 1008  Weight: 239 lb (108.4 kg)    GENERAL:alert, no distress and comfortable SKIN: skin color, texture, turgor are normal, no rashes or significant lesions EYES: normal, conjunctiva are pink and non-injected, sclera clear OROPHARYNX:no exudate, no erythema and lips, buccal mucosa, and tongue normal  NECK: supple, thyroid normal size, non-tender, without nodularity LYMPH:  no palpable lymphadenopathy in the cervical, axillary or inguinal LUNGS: clear to auscultation and percussion with normal breathing effort HEART: regular rate & rhythm and no murmurs  ABDOMEN:abdomen soft, non-tender and normal bowel sounds Musculoskeletal:no cyanosis of digits and no clubbing  PSYCH: alert & oriented x 3 with fluent speech NEURO: no focal motor/sensory deficits,  Breasts: Breast inspection showed left breast is surgically absent, surgical incision has healed well. Exam of the right breast and bilateral axillas reviewed no palpable mass or adenopathy.  LABORATORY DATA:  I have reviewed the data as listed CBC Latest Ref Rng & Units 08/13/2017 07/15/2017 06/24/2017  WBC 3.9 - 10.3 10e3/uL 7.4 7.8 5.1  Hemoglobin 11.6 - 15.9 g/dL 9.8(L) 10.9(L) 9.6(L)  Hematocrit 34.8 - 46.6 % 28.6(L) 31.9(L) 29.1(L)  Platelets 145 - 400 10e3/uL 272 312 260   CMP Latest Ref Rng & Units 08/13/2017 07/15/2017 06/24/2017  Glucose 70 - 140 mg/dl 149(H) 166(H) 161(H)  BUN 7.0 - 26.0 mg/dL 17.1 40.3(H) 15.6  Creatinine 0.6 - 1.1 mg/dL 1.0 1.4(H) 0.9  Sodium 136 - 145 mEq/L 138 139 139  Potassium 3.5 - 5.1 mEq/L 4.5 4.2 4.1  Chloride 96 - 112 mEq/L - - -  CO2 22 -  29 mEq/L _0 Calcium 8.4 - 10.4 mg/dL 9.6 9.4 9.3  Total Protein 6.4 - 8.3 g/dL 7.8 7.9 7.3  Total Bilirubin 0.20 - 1.20 mg/dL 0.40 0.48 0.41  Alkaline Phos 40 - 150 U/L 71 72 68  AST 5 - 34 U/L 30 34 33  ALT 0 - 55 U/L 33 37 37    PATHOLOGY REPORT  Diagnosis 04/17/2016 Breast, left, needle core biopsy, inner mid breast - INVASIVE DUCTAL CARCINOMA WITH CALCIFICATIONS, SEE COMMENT. - DUCTAL CARCINOMA IN SITU WITH COMEDONECROSIS. Microscopic Comment While grading is best performed on the resection specimen the invasive carcinoma appears grade 2. Prognostic markers will be ordered and reported in an addendum. Dr. Lyndon Code has reviewed the case. The case was called to [facility] on 04/20/2016. Results: HER2 - *EQUIVOCAL* (Her2 by IHC will be ordered.) RATIO OF HER2/CEP17 SIGNALS 1.73 AVERAGE HER2 COPY NUMBER PER CELL 4.15 By immunohistochemistry, the tumor cells are positive for Her2 (3). Results: IMMUNOHISTOCHEMICAL AND MORPHOMETRIC ANALYSIS PERFORMED MANUALLY Estrogen Receptor: 100%, POSITIVE, STRONG STAINING INTENSITY Progesterone Receptor: 15%, POSITIVE, STRONG STAINING INTENSITY Proliferation Marker Ki67: 10%  Diagnosis 05/15/2016 Lymph node, needle/core biopsy, right axilla -  BENIGN REACTIVE LYMPH NODE. - NO GRANULOMAS OR MALIGNANCY IDENTIFIED. - SEE COMMENT. Microscopic Comment Internal departmental review obtained (Dr. Lyndon Code) with agreement. Results are phoned to [facility] (05/18/16). (MEG:gt, 05/18/16)  Breast, simple mastectomy, Left 05/28/2016 - INVASIVE DUCTAL CARCINOMA, GRADE III/III, SPANNING 3.6 CM. - DUCTAL CARCINOMA IN SITU, HIGH GRADE. - LYMPHOVASCULAR INVASION IS IDENTIFIED. - THE SURGICAL RESECTION MARGINS ARE NEGATIVE FOR CARCINOMA. - SEE ONCOLOGY TABLE BELOW. Microscopic Comment BREAST, INVASIVE TUMOR, WITHOUT LYMPH NODES PRESENT Specimen, including laterality : Left breast Procedure: Simple  mastectomy Histologic type: Ductal with micropapillary features Grade: III Tubule formation: 3 Nuclear pleomorphism: 2 Mitotic: 3 Tumor size (gross measurement): 3.6 cm Margins: Greater than 0.2 cm to all margins Lymphovascular invasion: Present Ductal carcinoma in situ: Present Grade: High grade Extensive intraductal component: Yes Lobular neoplasia: Not identified Tumor focality: Unifocal Treatment effect: N/A Extent of tumor: Confined to breast parenchyma. Breast prognostic profile: [phone] Estrogen receptor: 100%, strong staining intensity Progesterone receptor: 15%, strong staining intensity Her 2 neu: Amplification was detected by immunohistochemistry. Ki-67: 10% 1 of 2 FINAL for ALAISHA, EVERSLEY ([id number]) Microscopic Comment(continued) Non-neoplastic breast: No significant findings. TNM: pT2, pNX (clinically recurrent). Comments: In addition to the main tumor, there is ductal carcinoma in situ present in random tissue submitted from the inferior lateral quadrant. (JBK:kh 06-01-16)  PROCEDURES  ECHO 07/01/17 Study Conclusions - Left ventricle: The cavity size was normal. Systolic function was   normal. The estimated ejection fraction was in the range of 55%   to 60%. Wall motion was normal; there were no regional wall   motion abnormalities. Doppler parameters are consistent with   abnormal left ventricular relaxation (grade 1 diastolic   dysfunction). - Aortic valve: Transvalvular velocity was within the normal range.   There was no stenosis. There was no regurgitation. - Mitral valve: Calcified annulus. Transvalvular velocity was   within the normal range. There was no evidence for stenosis.   There was no regurgitation. - Left atrium: The atrium was mildly dilated. - Atrial septum: The septum bowed from left to right, consistent   with increased left atrial pressure. - Tricuspid valve: There was mild regurgitation. - Pulmonary arteries: Systolic  pressure was within the normal   range. PA peak pressure: 24 mm Hg (S). - Global longitudinal strain -20.5% (normal).   RADIOGRAPHIC STUDIES: I have personally reviewed the radiological images as listed and agreed with the findings in the report. No results found.   Bone Density 04/29/2017 DualFemur Neck Right 04/29/2017    65.9         -0.8    0.928 g/cm2 AP Spine  L1-L4      04/29/2017    65.9         1.7     1.406 g/cm2  Mammogram 04/29/2017 IMPRESSION: No mammographic evidence of malignancy. A result letter of this screening mammogram will be mailed directly to the patient.  DG Chest 2 View 10/28/2016 IMPRESSION: No acute cardiopulmonary abnormality.  ASSESSMENT & PLAN:  66 y.o. 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  1. Cancer of central portion of left breast, pT2NxM0, stage IIA, G3, triple positive -I previously reviewed her surgical pathology findings in great details with patient and her family member. It showed a 3.6 cm mass, grade 3, surgical margins were negative. She had no sentinel lymph node biopsy due to her prior history of axillary lymph  node dissection. -Her restaging CT scan of bone scan showed no evidence of distant metastasis, I reviewed with patient. -We again discussed the risk of cancer recurrence of this complete surgical resection. We reviewed that HER-2 positive with cancers are more aggressive, with increased risk of metastasis.  -she has completed adjuvant TCHP (docetaxel, carbotaxol, Herceptin, pejeta), and one year Herceptin therapy - her last echo was 5/18, EF normal  - I strongly encouraged her to start calcium, vitamin d and multi vitamins. -I discussed the role of adjuvant oral her2 antibody Neratinib which has shown and 2-3% 5 year disease-free survival benefit in HER-2 positive disease, particularly in hormonal sensitive  disease. The treatment duration is 1 year. I also discussed the side effects in great detail, especially diarrhea and management, especially in the first few months. Although manufacture recommend prophylactic Imodium, I recommend patient to use Imodium as needed, but start as soon as diarrhea starts. -She is scheduled to have port removal next week. She plan to start Neratinib in 2 weeks  -She will continue letrozole for a total of 7 years  -I discussed how to take imodium the moment her BMs become loose on Nerlynx  And she can take up to 8 tablets a day. If her diarrhea becomes uncontrolled we can stop Nerlynx. She will start in 2 week after her port is removed.  -Continue breast cancer surveillance. She is clinically doing well, exam unremarkable, last mammogram in May 2018 was negative. No clinical concern for recurrence.  -Labs reviewed and she is still slightly anemic with Hb 9.8 -F/u in 4/5 weeks   2. Anemia  -Secondary to chemotherapy, her hemoglobin was previously 9.7, she has mild symptoms from anemia -Her H/H was normal before chemo  -Will continue to monitor.     3. DM and HTN  -She'll continue follow-up with her primary care physician -We again discussed her blood pressure and glucose level may be impacted by chemotherapy, and dexamethasone she takes before and after chemotherapy. -previously I strongly encouraged her to monitor her blood pressure and glucose at home, and consider increasing the dose of metformin and glyburide if needed -her BP and blood glucose has been normal/well controlled lately  4. Obesity  -I previously encouraged her to eat healthy and exercise regularly, she is willing to lose some weight after she completes chemotherapy.  5. Peripheral neuropathy, G1 -Secondary to chemotherapy, especially docetaxel -I previously encouraged her to take vitamin B complex. She does not feel she needs medication for now.  6. Fatigue  -Probably related to her previous  chemotherapy and anemia -I previously encouraged her to gradually increase her exercise   7. Bone Health -We previously discussed that Letrozole can cause some bone weakening  -She previously has had a bone density scan in the past, but it was several years ago -repeated DEXA scan in 03/2017 showed normal bone density  -I encouraged her to take calcium and vitamin D. We'll monitor her Vit D levels. -Her Bone density scan from 04/29/17 her Femur Neck Right T-score is -0.8. This patient is considered normal. -Will repeat DEXA in [place] removal by Dr. Prince Solian on 08/19/17 -start Nerlynx in 2 weeks.  -lab and F/u in 4-5 weeks  -Continue letrozole    All questions were answered. The patient knows to call the clinic with any problems, questions or concerns.  I spent 25 minutes counseling the patient face to face. The total time spent in the appointment was 30 minutes  and more than 50% was on counseling.  This document serves as a record of services personally performed by [name], MD. It was created on her behalf by Joslyn Devon, a trained medical scribe. The creation of this record is based on the scribe's personal observations and the provider's statements to them. This document has been checked and approved by the attending provider.     I have reviewed the above documentation for accuracy and completeness and I agree with the above.   [name], MD 08/13/2017

## 2017-08-12 ENCOUNTER — Encounter: Payer: Self-pay | Admitting: *Deleted

## 2017-08-12 ENCOUNTER — Encounter (HOSPITAL_BASED_OUTPATIENT_CLINIC_OR_DEPARTMENT_OTHER): Payer: Self-pay | Admitting: *Deleted

## 2017-08-13 ENCOUNTER — Ambulatory Visit (HOSPITAL_BASED_OUTPATIENT_CLINIC_OR_DEPARTMENT_OTHER): Payer: Medicare PPO | Admitting: Hematology

## 2017-08-13 ENCOUNTER — Ambulatory Visit: Payer: Medicare PPO

## 2017-08-13 ENCOUNTER — Telehealth: Payer: Self-pay | Admitting: Adult Health

## 2017-08-13 ENCOUNTER — Telehealth: Payer: Self-pay

## 2017-08-13 ENCOUNTER — Encounter: Payer: Self-pay | Admitting: Hematology

## 2017-08-13 ENCOUNTER — Other Ambulatory Visit (HOSPITAL_BASED_OUTPATIENT_CLINIC_OR_DEPARTMENT_OTHER): Payer: Medicare PPO

## 2017-08-13 VITALS — BP 153/73 | HR 88 | Temp 98.7°F | Resp 18 | Ht 67.0 in | Wt 239.0 lb

## 2017-08-13 DIAGNOSIS — E119 Type 2 diabetes mellitus without complications: Secondary | ICD-10-CM | POA: Diagnosis not present

## 2017-08-13 DIAGNOSIS — I10 Essential (primary) hypertension: Secondary | ICD-10-CM

## 2017-08-13 DIAGNOSIS — C50112 Malignant neoplasm of central portion of left female breast: Secondary | ICD-10-CM

## 2017-08-13 DIAGNOSIS — D6481 Anemia due to antineoplastic chemotherapy: Secondary | ICD-10-CM

## 2017-08-13 DIAGNOSIS — Z95828 Presence of other vascular implants and grafts: Secondary | ICD-10-CM

## 2017-08-13 DIAGNOSIS — Z170 Estrogen receptor positive status [ER+]: Secondary | ICD-10-CM

## 2017-08-13 DIAGNOSIS — R5383 Other fatigue: Secondary | ICD-10-CM

## 2017-08-13 DIAGNOSIS — Z452 Encounter for adjustment and management of vascular access device: Secondary | ICD-10-CM | POA: Diagnosis not present

## 2017-08-13 LAB — COMPREHENSIVE METABOLIC PANEL
ALT: 33 U/L (ref 0–55)
AST: 30 U/L (ref 5–34)
Albumin: 3.8 g/dL (ref 3.5–5.0)
Alkaline Phosphatase: 71 U/L (ref 40–150)
Anion Gap: 10 mEq/L (ref 3–11)
BUN: 17.1 mg/dL (ref 7.0–26.0)
CO2: 23 mEq/L (ref 22–29)
Calcium: 9.6 mg/dL (ref 8.4–10.4)
Chloride: 105 mEq/L (ref 98–109)
Creatinine: 1 mg/dL (ref 0.6–1.1)
EGFR: 72 mL/min/{1.73_m2} — ABNORMAL LOW (ref >90)
Glucose: 149 mg/dl — ABNORMAL HIGH (ref 70–140)
Potassium: 4.5 mEq/L (ref 3.5–5.1)
Sodium: 138 mEq/L (ref 136–145)
Total Bilirubin: 0.4 mg/dL (ref 0.20–1.20)
Total Protein: 7.8 g/dL (ref 6.4–8.3)

## 2017-08-13 LAB — CBC WITH DIFFERENTIAL/PLATELET
BASO%: 0.5 % (ref 0.0–2.0)
Basophils Absolute: 0 10*3/uL (ref 0.0–0.1)
EOS%: 3.5 % (ref 0.0–7.0)
Eosinophils Absolute: 0.3 10*3/uL (ref 0.0–0.5)
HCT: 28.6 % — ABNORMAL LOW (ref 34.8–46.6)
HGB: 9.8 g/dL — ABNORMAL LOW (ref 11.6–15.9)
LYMPH%: 33.4 % (ref 14.0–49.7)
MCH: 34.8 pg — ABNORMAL HIGH (ref 25.1–34.0)
MCHC: 34.3 g/dL (ref 31.5–36.0)
MCV: 101.5 fL — ABNORMAL HIGH (ref 79.5–101.0)
MONO#: 0.5 10*3/uL (ref 0.1–0.9)
MONO%: 7.4 % (ref 0.0–14.0)
NEUT#: 4.1 10*3/uL (ref 1.5–6.5)
NEUT%: 55.2 % (ref 38.4–76.8)
Platelets: 272 10*3/uL (ref 145–400)
RBC: 2.82 10*6/uL — ABNORMAL LOW (ref 3.70–5.45)
RDW: 13.7 % (ref 11.2–14.5)
WBC: 7.4 10*3/uL (ref 3.9–10.3)
lymph#: 2.5 10*3/uL (ref 0.9–3.3)

## 2017-08-13 MED ORDER — SODIUM CHLORIDE 0.9 % IJ SOLN
10.0000 mL | Freq: Once | INTRAMUSCULAR | Status: AC
  Administered 2017-08-13: 10 mL
  Filled 2017-08-13: qty 10

## 2017-08-13 MED ORDER — HEPARIN SOD (PORK) LOCK FLUSH 100 UNIT/ML IV SOLN
500.0000 [IU] | Freq: Once | INTRAVENOUS | Status: DC | PRN
  Filled 2017-08-13: qty 5

## 2017-08-13 MED ORDER — SODIUM CHLORIDE 0.9 % IJ SOLN
10.0000 mL | INTRAMUSCULAR | Status: DC | PRN
  Administered 2017-08-13: 10 mL via INTRAVENOUS
  Filled 2017-08-13: qty 10

## 2017-08-13 MED ORDER — HEPARIN SOD (PORK) LOCK FLUSH 100 UNIT/ML IV SOLN
500.0000 [IU] | Freq: Once | INTRAVENOUS | Status: AC
  Administered 2017-08-13: 500 [IU]
  Filled 2017-08-13: qty 5

## 2017-08-13 NOTE — Telephone Encounter (Signed)
Spoke with patient regarding her SCP in December.

## 2017-08-13 NOTE — Telephone Encounter (Signed)
Spoke with patient concerning upcoming appointment. Per 9/14 los

## 2017-08-16 ENCOUNTER — Encounter (HOSPITAL_BASED_OUTPATIENT_CLINIC_OR_DEPARTMENT_OTHER)
Admission: RE | Admit: 2017-08-16 | Discharge: 2017-08-16 | Disposition: A | Discharge status: Home / Self Care | Payer: Medicare PPO | Source: Ambulatory Visit | Attending: Surgery | Admitting: Surgery

## 2017-08-16 ENCOUNTER — Telehealth: Payer: Self-pay | Admitting: *Deleted

## 2017-08-16 DIAGNOSIS — Z87891 Personal history of nicotine dependence: Secondary | ICD-10-CM | POA: Diagnosis not present

## 2017-08-16 DIAGNOSIS — Z9012 Acquired absence of left breast and nipple: Secondary | ICD-10-CM | POA: Diagnosis not present

## 2017-08-16 DIAGNOSIS — F419 Anxiety disorder, unspecified: Secondary | ICD-10-CM | POA: Diagnosis not present

## 2017-08-16 DIAGNOSIS — Z01812 Encounter for preprocedural laboratory examination: Secondary | ICD-10-CM | POA: Insufficient documentation

## 2017-08-16 DIAGNOSIS — Z01810 Encounter for preprocedural cardiovascular examination: Secondary | ICD-10-CM

## 2017-08-16 DIAGNOSIS — Z7982 Long term (current) use of aspirin: Secondary | ICD-10-CM | POA: Diagnosis not present

## 2017-08-16 DIAGNOSIS — Z9221 Personal history of antineoplastic chemotherapy: Secondary | ICD-10-CM | POA: Diagnosis not present

## 2017-08-16 DIAGNOSIS — I10 Essential (primary) hypertension: Secondary | ICD-10-CM

## 2017-08-16 DIAGNOSIS — E119 Type 2 diabetes mellitus without complications: Secondary | ICD-10-CM | POA: Insufficient documentation

## 2017-08-16 DIAGNOSIS — Z452 Encounter for adjustment and management of vascular access device: Secondary | ICD-10-CM | POA: Diagnosis present

## 2017-08-16 DIAGNOSIS — Z853 Personal history of malignant neoplasm of breast: Secondary | ICD-10-CM | POA: Diagnosis not present

## 2017-08-16 DIAGNOSIS — Z79899 Other long term (current) drug therapy: Secondary | ICD-10-CM | POA: Diagnosis not present

## 2017-08-16 DIAGNOSIS — Z885 Allergy status to narcotic agent status: Secondary | ICD-10-CM | POA: Diagnosis not present

## 2017-08-16 DIAGNOSIS — Z7984 Long term (current) use of oral hypoglycemic drugs: Secondary | ICD-10-CM | POA: Diagnosis not present

## 2017-08-16 LAB — BASIC METABOLIC PANEL
Anion gap: 7 (ref 5–15)
BUN: 29 mg/dL — ABNORMAL HIGH (ref 6–20)
CO2: 25 mmol/L (ref 22–32)
Calcium: 9.4 mg/dL (ref 8.9–10.3)
Chloride: 104 mmol/L (ref 101–111)
Creatinine, Ser: 1.3 mg/dL — ABNORMAL HIGH (ref 0.44–1.00)
GFR calc Af Amer: 48 mL/min — ABNORMAL LOW (ref >60)
GFR calc non Af Amer: 42 mL/min — ABNORMAL LOW (ref >60)
Glucose, Bld: 192 mg/dL — ABNORMAL HIGH (ref 65–99)
Potassium: 4.7 mmol/L (ref 3.5–5.1)
Sodium: 136 mmol/L (ref 135–145)

## 2017-08-16 NOTE — Telephone Encounter (Signed)
Will be leaving jury duty papers for completion.

## 2017-08-19 ENCOUNTER — Encounter (HOSPITAL_BASED_OUTPATIENT_CLINIC_OR_DEPARTMENT_OTHER)
Admission: RE | Disposition: A | Discharge status: Home / Self Care | Payer: Self-pay | Source: Ambulatory Visit | Attending: Surgery

## 2017-08-19 ENCOUNTER — Ambulatory Visit (HOSPITAL_BASED_OUTPATIENT_CLINIC_OR_DEPARTMENT_OTHER): Payer: Medicare PPO | Admitting: Anesthesiology

## 2017-08-19 ENCOUNTER — Encounter (HOSPITAL_BASED_OUTPATIENT_CLINIC_OR_DEPARTMENT_OTHER): Payer: Self-pay | Admitting: *Deleted

## 2017-08-19 ENCOUNTER — Ambulatory Visit (HOSPITAL_BASED_OUTPATIENT_CLINIC_OR_DEPARTMENT_OTHER)
Admission: RE | Admit: 2017-08-19 | Discharge: 2017-08-19 | Disposition: A | Discharge status: Home / Self Care | Payer: Medicare PPO | Source: Ambulatory Visit | Attending: Surgery | Admitting: Surgery

## 2017-08-19 DIAGNOSIS — F419 Anxiety disorder, unspecified: Secondary | ICD-10-CM | POA: Insufficient documentation

## 2017-08-19 DIAGNOSIS — Z79899 Other long term (current) drug therapy: Secondary | ICD-10-CM | POA: Insufficient documentation

## 2017-08-19 DIAGNOSIS — I10 Essential (primary) hypertension: Secondary | ICD-10-CM | POA: Insufficient documentation

## 2017-08-19 DIAGNOSIS — Z452 Encounter for adjustment and management of vascular access device: Secondary | ICD-10-CM | POA: Insufficient documentation

## 2017-08-19 DIAGNOSIS — Z9012 Acquired absence of left breast and nipple: Secondary | ICD-10-CM | POA: Insufficient documentation

## 2017-08-19 DIAGNOSIS — Z853 Personal history of malignant neoplasm of breast: Secondary | ICD-10-CM | POA: Insufficient documentation

## 2017-08-19 DIAGNOSIS — Z7984 Long term (current) use of oral hypoglycemic drugs: Secondary | ICD-10-CM | POA: Insufficient documentation

## 2017-08-19 DIAGNOSIS — E119 Type 2 diabetes mellitus without complications: Secondary | ICD-10-CM | POA: Insufficient documentation

## 2017-08-19 DIAGNOSIS — Z87891 Personal history of nicotine dependence: Secondary | ICD-10-CM | POA: Insufficient documentation

## 2017-08-19 DIAGNOSIS — Z9221 Personal history of antineoplastic chemotherapy: Secondary | ICD-10-CM | POA: Insufficient documentation

## 2017-08-19 DIAGNOSIS — Z885 Allergy status to narcotic agent status: Secondary | ICD-10-CM | POA: Insufficient documentation

## 2017-08-19 DIAGNOSIS — Z7982 Long term (current) use of aspirin: Secondary | ICD-10-CM | POA: Insufficient documentation

## 2017-08-19 HISTORY — PX: PORT-A-CATH REMOVAL: SHX5289

## 2017-08-19 LAB — GLUCOSE, CAPILLARY
Glucose-Capillary: 159 mg/dL — ABNORMAL HIGH (ref 65–99)
Glucose-Capillary: 158 mg/dL — ABNORMAL HIGH (ref 65–99)

## 2017-08-19 SURGERY — REMOVAL PORT-A-CATH
Anesthesia: Monitor Anesthesia Care | Site: Chest | Laterality: Right

## 2017-08-19 MED ORDER — CHLORHEXIDINE GLUCONATE CLOTH 2 % EX PADS: 6.0000 | MEDICATED_PAD | Freq: Once | CUTANEOUS | Status: DC

## 2017-08-19 MED ORDER — ONDANSETRON HCL 4 MG/2ML IJ SOLN
INTRAMUSCULAR | Status: DC | PRN
  Administered 2017-08-19: 4 mg via INTRAVENOUS

## 2017-08-19 MED ORDER — PROPOFOL 10 MG/ML IV BOLUS
INTRAVENOUS | Status: AC
  Filled 2017-08-19: qty 20

## 2017-08-19 MED ORDER — FENTANYL CITRATE (PF) 100 MCG/2ML IJ SOLN: 25.0000 ug | INTRAMUSCULAR | Status: DC | PRN

## 2017-08-19 MED ORDER — LIDOCAINE 2% (20 MG/ML) 5 ML SYRINGE
INTRAMUSCULAR | Status: AC
  Filled 2017-08-19: qty 5

## 2017-08-19 MED ORDER — FENTANYL CITRATE (PF) 100 MCG/2ML IJ SOLN
INTRAMUSCULAR | Status: AC
  Filled 2017-08-19: qty 2

## 2017-08-19 MED ORDER — MEPERIDINE HCL 25 MG/ML IJ SOLN: 6.2500 mg | INTRAMUSCULAR | Status: DC | PRN

## 2017-08-19 MED ORDER — BUPIVACAINE-EPINEPHRINE 0.25% -1:200000 IJ SOLN
INTRAMUSCULAR | Status: DC | PRN
  Administered 2017-08-19: 13 mL

## 2017-08-19 MED ORDER — LIDOCAINE HCL (CARDIAC) 20 MG/ML IV SOLN
INTRAVENOUS | Status: DC | PRN
  Administered 2017-08-19: 50 mg via INTRAVENOUS

## 2017-08-19 MED ORDER — LIDOCAINE-EPINEPHRINE 1 %-1:200000 IJ SOLN
INTRAMUSCULAR | Status: AC
Start: 2017-08-19 — End: ?
  Filled 2017-08-19: qty 30

## 2017-08-19 MED ORDER — SCOPOLAMINE 1 MG/3DAYS TD PT72: 1.0000 | MEDICATED_PATCH | Freq: Once | TRANSDERMAL | Status: DC | PRN

## 2017-08-19 MED ORDER — MIDAZOLAM HCL 5 MG/5ML IJ SOLN
INTRAMUSCULAR | Status: DC | PRN
  Administered 2017-08-19: 2 mg via INTRAVENOUS

## 2017-08-19 MED ORDER — OXYCODONE HCL 5 MG/5ML PO SOLN: 5.0000 mg | Freq: Once | ORAL | Status: DC | PRN

## 2017-08-19 MED ORDER — PROMETHAZINE HCL 25 MG/ML IJ SOLN: 6.2500 mg | INTRAMUSCULAR | Status: DC | PRN

## 2017-08-19 MED ORDER — CEFAZOLIN SODIUM-DEXTROSE 2-4 GM/100ML-% IV SOLN
2.0000 g | INTRAVENOUS | Status: AC
  Administered 2017-08-19: 2 g via INTRAVENOUS

## 2017-08-19 MED ORDER — PROPOFOL 10 MG/ML IV BOLUS
INTRAVENOUS | Status: DC | PRN
  Administered 2017-08-19: 20 mg via INTRAVENOUS
  Administered 2017-08-19 (×2): 10 mg via INTRAVENOUS
  Administered 2017-08-19: 20 mg via INTRAVENOUS

## 2017-08-19 MED ORDER — BUPIVACAINE-EPINEPHRINE (PF) 0.25% -1:200000 IJ SOLN
INTRAMUSCULAR | Status: AC
  Filled 2017-08-19: qty 90

## 2017-08-19 MED ORDER — METHYLENE BLUE 0.5 % INJ SOLN
INTRAVENOUS | Status: AC
  Filled 2017-08-19: qty 10

## 2017-08-19 MED ORDER — CEFAZOLIN SODIUM-DEXTROSE 2-4 GM/100ML-% IV SOLN
INTRAVENOUS | Status: AC
  Filled 2017-08-19: qty 100

## 2017-08-19 MED ORDER — LACTATED RINGERS IV SOLN
INTRAVENOUS | Status: DC
  Administered 2017-08-19: 07:00:00 via INTRAVENOUS

## 2017-08-19 MED ORDER — MIDAZOLAM HCL 2 MG/2ML IJ SOLN
INTRAMUSCULAR | Status: AC
  Filled 2017-08-19: qty 2

## 2017-08-19 MED ORDER — FENTANYL CITRATE (PF) 100 MCG/2ML IJ SOLN
INTRAMUSCULAR | Status: DC | PRN
  Administered 2017-08-19 (×2): 50 ug via INTRAVENOUS

## 2017-08-19 MED ORDER — BUPIVACAINE HCL (PF) 0.5 % IJ SOLN
INTRAMUSCULAR | Status: AC
  Filled 2017-08-19: qty 30

## 2017-08-19 MED ORDER — SODIUM CHLORIDE 0.9 % IJ SOLN
INTRAMUSCULAR | Status: AC
  Filled 2017-08-19: qty 10

## 2017-08-19 MED ORDER — PROPOFOL 10 MG/ML IV BOLUS
INTRAVENOUS | Status: AC
  Filled 2017-08-19: qty 40

## 2017-08-19 MED ORDER — OXYCODONE HCL 5 MG PO TABS: 5.0000 mg | ORAL_TABLET | Freq: Once | ORAL | Status: DC | PRN

## 2017-08-19 MED ORDER — FENTANYL CITRATE (PF) 100 MCG/2ML IJ SOLN: 50.0000 ug | INTRAMUSCULAR | Status: DC | PRN

## 2017-08-19 MED ORDER — MIDAZOLAM HCL 2 MG/2ML IJ SOLN: 1.0000 mg | INTRAMUSCULAR | Status: DC | PRN

## 2017-08-19 MED ORDER — ONDANSETRON HCL 4 MG/2ML IJ SOLN
INTRAMUSCULAR | Status: AC
  Filled 2017-08-19: qty 2

## 2017-08-19 SURGICAL SUPPLY — 40 items
APPLICATOR COTTON TIP 6IN STRL (MISCELLANEOUS) IMPLANT
BENZOIN TINCTURE PRP APPL 2/3 (GAUZE/BANDAGES/DRESSINGS) ×2 IMPLANT
BLADE HEX COATED 2.75 (ELECTRODE) ×2 IMPLANT
BLADE SURG 15 STRL LF DISP TIS (BLADE) ×1 IMPLANT
BLADE SURG 15 STRL SS (BLADE) ×1
CANISTER SUCT 1200ML W/VALVE (MISCELLANEOUS) IMPLANT
CHLORAPREP W/TINT 26ML (MISCELLANEOUS) ×2 IMPLANT
COVER BACK TABLE 60X90IN (DRAPES) ×2 IMPLANT
COVER MAYO STAND STRL (DRAPES) ×2 IMPLANT
DECANTER SPIKE VIAL GLASS SM (MISCELLANEOUS) IMPLANT
DRAPE LAPAROTOMY 100X72 PEDS (DRAPES) ×2 IMPLANT
DRAPE UTILITY XL STRL (DRAPES) ×2 IMPLANT
DRSG TEGADERM 4X4.75 (GAUZE/BANDAGES/DRESSINGS) ×2 IMPLANT
ELECT REM PT RETURN 9FT ADLT (ELECTROSURGICAL) ×2
ELECTRODE REM PT RTRN 9FT ADLT (ELECTROSURGICAL) ×1 IMPLANT
GAUZE SPONGE 4X4 12PLY STRL LF (GAUZE/BANDAGES/DRESSINGS) IMPLANT
GLOVE BIO SURGEON STRL SZ 6.5 (GLOVE) ×2 IMPLANT
GLOVE BIO SURGEON STRL SZ7 (GLOVE) ×2 IMPLANT
GLOVE BIOGEL PI IND STRL 7.0 (GLOVE) ×1 IMPLANT
GLOVE BIOGEL PI IND STRL 7.5 (GLOVE) ×1 IMPLANT
GLOVE BIOGEL PI INDICATOR 7.0 (GLOVE) ×1
GLOVE BIOGEL PI INDICATOR 7.5 (GLOVE) ×1
GLOVE EXAM NITRILE MD LF STRL (GLOVE) ×2 IMPLANT
GOWN STRL REUS W/ TWL LRG LVL3 (GOWN DISPOSABLE) ×2 IMPLANT
GOWN STRL REUS W/TWL LRG LVL3 (GOWN DISPOSABLE) ×2
NEEDLE HYPO 25X1 1.5 SAFETY (NEEDLE) ×2 IMPLANT
NS IRRIG 1000ML POUR BTL (IV SOLUTION) IMPLANT
PACK BASIN DAY SURGERY FS (CUSTOM PROCEDURE TRAY) ×2 IMPLANT
PENCIL BUTTON HOLSTER BLD 10FT (ELECTRODE) ×2 IMPLANT
SPONGE GAUZE 2X2 8PLY STRL LF (GAUZE/BANDAGES/DRESSINGS) ×2 IMPLANT
SPONGE LAP 4X18 X RAY DECT (DISPOSABLE) IMPLANT
STRIP CLOSURE SKIN 1/2X4 (GAUZE/BANDAGES/DRESSINGS) ×2 IMPLANT
SUT MON AB 4-0 PC3 18 (SUTURE) ×2 IMPLANT
SUT VIC AB 3-0 SH 27 (SUTURE) ×1
SUT VIC AB 3-0 SH 27X BRD (SUTURE) ×1 IMPLANT
SYR CONTROL 10ML LL (SYRINGE) ×2 IMPLANT
TOWEL OR 17X24 6PK STRL BLUE (TOWEL DISPOSABLE) ×2 IMPLANT
TOWEL OR NON WOVEN STRL DISP B (DISPOSABLE) IMPLANT
TUBE CONNECTING 20X1/4 (TUBING) IMPLANT
YANKAUER SUCT BULB TIP NO VENT (SUCTIONS) IMPLANT

## 2017-08-19 NOTE — Op Note (Signed)
Pre-op diagnosis:  Recurrent left breast cancer Post-op diagnosis:  Same Procedure performed: Right subclavian port removal Surgeon:Jakeisha Stricker K. Anesthesia: Local Mac Indications: This is a 66 year old female who is status post left mastectomy for recurrent left breast cancer. She also had a right subclavian vein port placed for chemotherapy. She has not completed chemotherapy and presents now for port removal.  Description of procedure: The patient is brought to the operating room and placed in a supine position on the operating room table. After an adequate level of intravenous sedation was given, her right chest was prepped with ChloraPrep and draped sterile fashion. A timeout was taken to ensure the proper patient and proper procedure.  We infiltrated the area over the incision with 0.25% Marcaine with epinephrine. We open her previous incision. We dissected down the subcutaneous tissues with cautery until we encountered the port. We identified the catheter tubing. We removed the catheter from the subclavian vein and held pressure for several minutes for hemostasis. Both sutures were then removed and the port was removed from the subcutaneous pocket. We excised part of the subcutaneous fibrin sheath. We examined carefully for hemostasis. The wound was closed with 3-0 Vicryl and 4-0 Monocryl. Benzoin and Steri-Strips were applied. Tegaderm dressing was applied. The patient was then awakened brought to the recovery room in stable condition. All sponge, instrument, and needle counts are correct.  Imogene Burn. Georgette Dover, MD, [facility]  General/ Trauma Surgery  08/19/2017 8:08 AM

## 2017-08-19 NOTE — H&P (Signed)
Lindsey Marquez is an 66 y.o. female.   Chief Complaint: Port removal HPI: 66 yo female s/p mastectomy and right port placement in 2017 for recurrent left breast cancer.  She has completed chemotherapy and is now referred for port removal.  She will continue treatment with Neratinib per Dr. Burr Medico.  Past Medical History:  Diagnosis Date  . Blood transfusion without reported diagnosis   . Breast cancer [facility]) August 2002   Invasive ductal carcinoma Left breast. 2002, 2017  . Diabetes mellitus without complication ([place])   . Family history of malignant neoplasm of ovary   . Family history of pancreatic cancer   . Hypertension     Past Surgical History:  Procedure Laterality Date  . BREAST SURGERY Left 2003  . COLONOSCOPY    . FOOT SURGERY  2000  . MASTECTOMY Left 05/28/2016  . port a cath insertion  05/28/2016  . PORTACATH PLACEMENT Right 05/28/2016   Procedure: INSERTION PORT-A-CATH;  Surgeon: [name], MD;  Location: [place];  Service: General;  Laterality: Right;  . TOTAL MASTECTOMY Left 05/28/2016   Procedure: LEFT  MASTECTOMY;  Surgeon: [name], MD;  Location: [place];  Service: General;  Laterality: Left;  . TUBAL LIGATION  22 yrs since  2004.   Bilateral.    Family History  Problem Relation Age of Onset  . Prostate cancer Father   . Ovarian cancer Maternal Aunt 34       deceased  . Pancreatic cancer Mother 39       deceased  . Cancer Maternal Aunt        2 other mat aunts; unk. primary in 69s; deceased  . Cancer Maternal Uncle        "bone ca"; unk. primary; deceased 40s  . Prostate cancer Maternal Uncle        deceased 56  . Colon cancer Neg Hx   . Esophageal cancer Neg Hx   . Rectal cancer Neg Hx   . Stomach cancer Neg Hx    Social History:  reports that she quit smoking about 41 years ago. She has a 0.70 pack-year smoking history. She has never used smokeless tobacco. She reports that she does not drink alcohol or use drugs.  Allergies:  Allergies   Allergen Reactions  . Codeine     "get crazy", "see things that aren't there"    Medications Prior to Admission  Medication Sig Dispense Refill  . aspirin 81 MG tablet Take 81 mg by mouth daily.     Marland Kitchen glyBURIDE-metformin (GLUCOVANCE) 2.5-500 MG tablet Take 2 tablets by mouth 2 (two) times daily with a meal. 360 tablet 1  . ibuprofen (ADVIL,MOTRIN) 200 MG tablet Take 200 mg by mouth every 8 (eight) hours as needed.      . lidocaine-prilocaine (EMLA) cream Apply to affected area once 30 g 3  . lisinopril-hydrochlorothiazide (PRINZIDE,ZESTORETIC) 20-25 MG tablet TAKE 1 TABLET BY MOUTH DAILY. 90 tablet 0  . Neratinib Maleate (NERLYNX) 40 MG tablet Take 6 tablets (240 mg total) by mouth daily. Take with food. 180 tablet 1  . Omega-3 Fatty Acids (FISH OIL) 1200 MG CAPS Take 1 capsule by mouth daily.    . Potassium Chloride ER 20 MEQ TBCR Take 40 mEq by mouth daily. 60 tablet 1  . torsemide (DEMADEX) 20 MG tablet Take 20 mg by mouth daily.    . TRUEPLUS LANCETS 33G MISC 1 each by Does not apply route 3 (three) times daily. 600 each 2  .  glucose blood (TRUE METRIX BLOOD GLUCOSE TEST) test strip 1 each by Other route 3 (three) times daily. Use as instructed 600 each 2  . letrozole (FEMARA) 2.5 MG tablet Take 1 tablet (2.5 mg total) by mouth daily. 30 tablet 3    Results for orders placed or performed during the hospital encounter of 08/19/17 (from the past 48 hour(s))  Glucose, capillary     Status: Abnormal   Collection Time: 08/19/17  6:48 AM  Result Value Ref Range   Glucose-Capillary 159 (H) 65 - 99 mg/dL   No results found.  ROS Constitutional: Denies fevers, chills or abnormal night sweats  Eyes: Denies blurriness of vision, double vision or watery eyes Ears, nose, mouth, throat, and face: Denies mucositis or sore throat Respiratory: Denies cough, dyspnea or wheezes Cardiovascular: Denies palpitation, chest discomfort (+) reduced bilateral feet swelling Gastrointestinal:  Denies  nausea, heartburn or change in bowel habits Skin: Denies abnormal skin rashes  Lymphatics: Denies new lymphadenopathy or easy bruising Neurological:Denies numbness (+) Tingling in hands and feet have gotten better MSK: (+) cramping in leg (+) joint aches (4/10) Behavioral/Psych: Mood is stable, no new changes  All other systems were reviewed with the patient and are negative. Blood pressure (!) 135/57, pulse 83, temperature 98.4 F (36.9 C), temperature source Oral, resp. rate 17, height 5\' 7"  (1.702 m), weight 108.7 kg (239 lb 9.6 oz), SpO2 100 %. Physical Exam  WDWn in NAD Chest - left mastectomy site with well-healed incision Right chest - port site   Assessment/Plan Port removal.  The surgical procedure has been discussed with the patient.  Potential risks, benefits, alternative treatments, and expected outcomes have been explained.  All of the patient's questions at this time have been answered.  The likelihood of reaching the patient's treatment goal is good.  The patient understand the proposed surgical procedure and wishes to proceed.   Alitzel Cookson K., MD 08/19/2017, 7:01 AM

## 2017-08-19 NOTE — Transfer of Care (Signed)
Immediate Anesthesia Transfer of Care Note  Patient: Lindsey Marquez  Procedure(s) Performed: Procedure(s): REMOVAL PORT-A-CATH (Right)  Patient Location: PACU  Anesthesia Type:MAC  Level of Consciousness: awake, alert  and oriented  Airway & Oxygen Therapy: Patient Spontanous Breathing and Patient connected to face mask oxygen  Post-op Assessment: Report given to RN and Post -op Vital signs reviewed and stable  Post vital signs: Reviewed and stable  Last Vitals:  Vitals:   08/19/17 0637  BP: (!) 135/57  Pulse: 83  Resp: 17  Temp: 36.9 C  SpO2: 100%    Last Pain:  Vitals:   08/19/17 0637  TempSrc: Oral  PainSc: 0-No pain         Complications: No apparent anesthesia complications

## 2017-08-19 NOTE — Anesthesia Postprocedure Evaluation (Signed)
Anesthesia Post Note  Patient: Lindsey Marquez  Procedure(s) Performed: Procedure(s) (LRB): REMOVAL PORT-A-CATH (Right)     Patient location during evaluation: PACU Anesthesia Type: MAC Level of consciousness: awake and alert Pain management: pain level controlled Vital Signs Assessment: post-procedure vital signs reviewed and stable Respiratory status: spontaneous breathing Cardiovascular status: stable Anesthetic complications: no    Last Vitals:  Vitals:   08/19/17 0830 08/19/17 0850  BP:  131/61  Pulse: 80 90  Resp: (!) 23 (!) 22  Temp:  37 C  SpO2: 99% 100%    Last Pain:  Vitals:   08/19/17 0850  TempSrc:   PainSc: 0-No pain                 Nolon Nations

## 2017-08-19 NOTE — Anesthesia Preprocedure Evaluation (Signed)
Anesthesia Evaluation  Patient identified by MRN, date of birth, ID band Patient awake    Reviewed: Allergy & Precautions, NPO status , Patient's Chart, lab work & pertinent test results  Airway Mallampati: II  TM Distance: >3 FB Neck ROM: Full    Dental no notable dental hx. (+) Teeth Intact, Dental Advisory Given   Pulmonary former smoker,    Pulmonary exam normal breath sounds clear to auscultation       Cardiovascular hypertension, Pt. on medications Normal cardiovascular exam Rhythm:Regular Rate:Normal     Neuro/Psych PSYCHIATRIC DISORDERS Anxiety negative neurological ROS     GI/Hepatic negative GI ROS, Neg liver ROS,   Endo/Other  diabetes, Type 2  Renal/GU negative Renal ROS     Musculoskeletal negative musculoskeletal ROS (+)   Abdominal   Peds  Hematology negative hematology ROS (+)   Anesthesia Other Findings   Reproductive/Obstetrics                             Anesthesia Physical  Anesthesia Plan  ASA: II  Anesthesia Plan: MAC   Post-op Pain Management:    Induction: Intravenous  PONV Risk Score and Plan: 2 and Ondansetron and Dexamethasone  Airway Management Planned:   Additional Equipment:   Intra-op Plan:   Post-operative Plan:   Informed Consent: I have reviewed the patients History and Physical, chart, labs and discussed the procedure including the risks, benefits and alternatives for the proposed anesthesia with the patient or authorized representative who has indicated his/her understanding and acceptance.   Dental advisory given  Plan Discussed with: CRNA  Anesthesia Plan Comments:         Anesthesia Quick Evaluation

## 2017-08-19 NOTE — Discharge Instructions (Signed)
° ° °  PORT-A-CATH REMOVAL: POST OP INSTRUCTIONS  Always review your discharge instruction sheet given to you by the facility where your surgery was performed.   You make take acetaminophen (Tylenol) or ibuprofen (Advil) as needed.  1. Take your usually prescribed medications unless otherwise directed.  2. You should follow a light diet for the remainder of the day after your procedure. 3. Most patients will experience some mild swelling and/or bruising in the area of the incision. It may take several days to resolve. 4. Unless discharge instructions indicate otherwise, you may remove your bandages 48 hours after surgery, and you may shower at that time. You may have steri-strips (small white skin tapes) in place directly over the incision.  These strips should be left on the skin for 7-10 days.  5. ACTIVITIES:  Limit activity involving your arms for the next 72 hours. Do no strenuous exercise or activity for 1 week. You may drive when you can comfortably wear a seatbelt, and you can maneuver your car. 10.You may need to see your doctor in the office for a follow-up appointment.  Please       check with your doctor.    WHEN TO CALL YOUR DOCTOR [phone]): 1. Fever over 101.0 2. Chills 3. Continued bleeding from incision 4. Increased redness and tenderness at the site 5. Shortness of breath, difficulty breathing   The clinic staff is available to answer your questions during regular business hours. Please dont hesitate to call and ask to speak to one of the nurses or medical assistants for clinical concerns. If you have a medical emergency, go to the nearest emergency room or call 911.  A surgeon from [facility] is always on call at the hospital.     For further information, please visit [email or web address]       Post Anesthesia Home Care Instructions  Activity: Get plenty of rest for the remainder of the day. A responsible individual must stay with you  for 24 hours following the procedure.  For the next 24 hours, DO NOT: -Drive a car -Paediatric nurse -Drink alcoholic beverages -Take any medication unless instructed by your physician -Make any legal decisions or sign important papers.  Meals: Start with liquid foods such as gelatin or soup. Progress to regular foods as tolerated. Avoid greasy, spicy, heavy foods. If nausea and/or vomiting occur, drink only clear liquids until the nausea and/or vomiting subsides. Call your physician if vomiting continues.  Special Instructions/Symptoms: Your throat may feel dry or sore from the anesthesia or the breathing tube placed in your throat during surgery. If this causes discomfort, gargle with warm salt water. The discomfort should disappear within 24 hours.  If you had a scopolamine patch placed behind your ear for the management of post- operative nausea and/or vomiting:  1. The medication in the patch is effective for 72 hours, after which it should be removed.  Wrap patch in a tissue and discard in the trash. Wash hands thoroughly with soap and water. 2. You may remove the patch earlier than 72 hours if you experience unpleasant side effects which may include dry mouth, dizziness or visual disturbances. 3. Avoid touching the patch. Wash your hands with soap and water after contact with the patch.

## 2017-08-20 ENCOUNTER — Encounter (HOSPITAL_BASED_OUTPATIENT_CLINIC_OR_DEPARTMENT_OTHER): Payer: Self-pay | Admitting: Surgery

## 2017-08-27 ENCOUNTER — Telehealth: Payer: Self-pay | Admitting: *Deleted

## 2017-08-27 ENCOUNTER — Encounter: Payer: Self-pay | Admitting: Hematology

## 2017-08-27 NOTE — Telephone Encounter (Signed)
Called pt and left message on voice mail informing pt that a letter for jury duty is ready for pick up on Mon 08/30/17.

## 2017-09-04 ENCOUNTER — Other Ambulatory Visit: Payer: Self-pay | Admitting: Internal Medicine

## 2017-09-06 ENCOUNTER — Other Ambulatory Visit: Payer: Self-pay | Admitting: Hematology

## 2017-09-06 NOTE — Telephone Encounter (Signed)
Received refill electronically Last office visit 12/07/16 Last lipid check 09/24/16

## 2017-09-07 NOTE — Progress Notes (Addendum)
[place]  [phone] [phone]  Clinic Follow Up Note   Patient Care Team: [name], NP as PCP - General (Internal Medicine) [name], MD as Consulting Physician (General Surgery) 09/08/2017   CHIEF COMPLAINTS:  Follow up recurrent left breast cancer   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 [facility])   Staging form: Breast, AJCC 7th Edition     Clinical stage from 04/17/2016: Stage IA (T1c, N0, M0) - Signed by [name], MD on 05/07/2016     Pathologic stage from 05/28/2016: Stage IIA (T2, N0, cM0) - Signed by [name], MD on 06/11/2016 History of left breast cancer   Staging form: Breast, AJCC 7th Edition     Clinical: Stage IIIB (T4d, N1, cM0) - Signed by [name], MD on 12/14/2013     Pathologic: Stage IIIB (T4d, N1a, cM0) - Signed by [name], MD on 12/14/2013       History of left breast cancer   09/12/2002 Imaging    CT scan show no evidence of disease apart from the left axilla      09/2002 -  Neo-Adjuvant Chemotherapy    TAC X4 cycles       01/10/2003 Surgery    The patient had left lumpectomy and axillary lymph node dissection which show residual breast cancer and a sentinel lymph node was positive      2004 -  Adjuvant Chemotherapy    Cytoxan with 5-FU x4 cycles (methotrexate added at cycle 4).      2004 -  Radiation Therapy    Adjuvant left breast radiation      12/2002 Receptors her2    ER, PR and HER-2 were all negative.       Cancer of central portion of left breast ([place])   04/13/2016 Mammogram    Scheduler coarse heterogeneous calcifications spanning an area of 3.7 cm in the lumpectomy site, indeterminate. Ultrasound of the left axilla was negative.      04/17/2016 Initial Diagnosis    Cancer of central portion of left breast ([place])      04/17/2016 Initial Biopsy    Left breast core needle biopsy showed invasive and in situ ductal carcinoma with calcification, grade 2.      04/17/2016 Receptors her2    Your 100% positive, PR 15% positive, strong staining, Ki-67 10%, HER-2 positive by IHC (3)      05/05/2016 Imaging    Bilateral breast MRI showed a 1.3 x 0.8 x 0.7 cm biopsy-proven ductal carcinoma in the region of the previous lumpectomy. Enlarged lobulated right inferior axillary lymph node with diffuse cortical thickening, biopsy is recommended.      05/15/2016 Pathology Results    right axilary node biopsy was negative for malignant cell       05/28/2016 Surgery    Simple left mastectomy      05/28/2016 Pathology Results    Left mastectomy showed invasive ductal carcinoma, grade 3, 3.6 cm, high grade DCIS, (+) LVI, surgical margins were negative. Tumor 3.6 cm, no lymph nodes identified.      06/25/2016 Imaging    Staging CT scan of the chest, abdomen and pelvis with contrast and a bone scan showed no evidence of metastasis. Postoperative fluid collection in the left breast, likely a seroma or hematoma. Right axillary adenopathy, which was biopsied previously.       07/07/2016 - 07/15/2017 Chemotherapy    Adjuvant chemotherapy with docetaxel, carboplatin, Herceptin and pejeta  every 3 weeks, for 6 cycles, followed by Herceptin maintenance therapy for total of one year treatment  Ended [facility] 11/04/16        12/15/2016 -  Anti-estrogen oral therapy    Adjuvant letrozole 2.5 mg once daily      04/29/2017 Mammogram     Mammogram 04/29/2017 IMPRESSION: No mammographic evidence of malignancy. A result letter of this screening mammogram will be mailed directly to the patient.          HISTORY OF PRESENTING ILLNESS: 05/08/16 [place] 66 y.o. female is here because of her recurrent left breast cancer.  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 She was seen by multiple medical oncologist in our practice in the past, last  was seen by Dr. Alvy Bimler in 11/2013.  She has been doing well, and is compliant with annual screening mammogram. The screening mammogram on 04/17/2016 showed calcification in the left breast, and core needle biopsy showed invasive and in situ ductal carcinoma, grade 2, ER/PR positive, HER-2 amplified. She was referred to seeing breast surgeon Dr. Georgette Dover and referred back to [place].   She feels well, she did not feel any lump in her breast or axillas latley. She denies any pain, dyspnea, or GI symptoms. She had right nipple rash and right axillary swelling in the past one week, after she drinks some diet tea with citric. Breast MRI showed an enlarged lobulated right inferior axillary lymph node with diffuse cortical thickening, in addition to the known left breast mass which measured 1.3 cm on MRI.  GYN HISTORY  Menarchal: 12 LMP: 2003 Contraceptive: 20 HRT: n/a  G2P2: 46 yo Son and 59 yo son who died   CURRENT THERAPY:  Letrozole 2.5 mg daily, started on 12/15/2016, Nerlynx 240 mg daily began on 08/26/17.  INTERIM HISTORY:  [name] returns for follow up. She started Nerlynx on 08/26/2017 and has had 3-4 BMs a day and had a max of 6 yesterday. She takes 3 imodium per day. She reports urgent diarrhea with an episode of leakage. She has intermittent nausea and abdominal cramping. She has not taken a treatment break. She has stable joint aches. She denies fever, chills, skin rash, chest pain, or moderate fatigue.    MEDICAL HISTORY:  Past Medical History:  Diagnosis Date  . Blood transfusion without reported diagnosis   . Breast cancer [facility]) August 2002   Invasive ductal carcinoma Left breast. 2002, 2017  . Diabetes mellitus without complication ([place])   . Family history of malignant neoplasm of ovary   . Family history of pancreatic cancer   . Hypertension     SURGICAL HISTORY: Past Surgical History:  Procedure Laterality Date  . BREAST SURGERY Left 2003  . COLONOSCOPY    . FOOT SURGERY  2000    . MASTECTOMY Left 05/28/2016  . port a cath insertion  05/28/2016  . PORT-A-CATH REMOVAL Right 08/19/2017   Procedure: REMOVAL PORT-A-CATH;  Surgeon: [name], MD;  Location: [place];  Service: General;  Laterality: Right;  . PORTACATH PLACEMENT Right 05/28/2016   Procedure: INSERTION PORT-A-CATH;  Surgeon: [name], MD;  Location: [place];  Service: General;  Laterality: Right;  . TOTAL MASTECTOMY Left 05/28/2016   Procedure: LEFT  MASTECTOMY;  Surgeon: [name], MD;  Location: [place];  Service: General;  Laterality: Left;  . TUBAL LIGATION  22 yrs since  2004.  Bilateral.    SOCIAL HISTORY: Social History   Social History  . Marital status: Divorced    Spouse name: N/A  . Number of children: N/A  . Years of education: N/A   Occupational History  . Not on file.   Social History Main Topics  . Smoking status: Former Smoker    Packs/day: 0.10    Years: 7.00    Quit date: 12/01/1975  . Smokeless tobacco: Never Used  . Alcohol use No  . Drug use: No  . Sexual activity: Yes   Other Topics Concern  . Not on file   Social History Narrative  . No narrative on file   She is retired from school   FAMILY HISTORY: Family History  Problem Relation Age of Onset  . Prostate cancer Father   . Ovarian cancer Maternal Aunt 78       deceased  . Pancreatic cancer Mother 81       deceased  . Cancer Maternal Aunt        2 other mat aunts; unk. primary in 2s; deceased  . Cancer Maternal Uncle        "bone ca"; unk. primary; deceased 78s  . Prostate cancer Maternal Uncle        deceased 40  . Colon cancer Neg Hx   . Esophageal cancer Neg Hx   . Rectal cancer Neg Hx   . Stomach cancer Neg Hx     ALLERGIES:  is allergic to codeine.  MEDICATIONS:  Current Outpatient Prescriptions  Medication Sig Dispense Refill  . aspirin 81 MG tablet Take 81 mg by mouth daily.     Marland Kitchen glucose blood (TRUE METRIX BLOOD GLUCOSE TEST) test strip 1 each by Other route  3 (three) times daily. Use as instructed 600 each 2  . glyBURIDE-metformin (GLUCOVANCE) 2.5-500 MG tablet TAKE 2 TABLETS BY MOUTH 2 (TWO) TIMES DAILY WITH A MEAL. 360 tablet 1  . ibuprofen (ADVIL,MOTRIN) 200 MG tablet Take 200 mg by mouth every 8 (eight) hours as needed.      Marland Kitchen letrozole (FEMARA) 2.5 MG tablet Take 1 tablet (2.5 mg total) by mouth daily. 30 tablet 3  . lisinopril-hydrochlorothiazide (PRINZIDE,ZESTORETIC) 20-25 MG tablet TAKE 1 TABLET BY MOUTH DAILY. 90 tablet 0  . Neratinib Maleate (NERLYNX) 40 MG tablet Take 6 tablets (240 mg total) by mouth daily. Take with food. 180 tablet 1  . Omega-3 Fatty Acids (FISH OIL) 1200 MG CAPS Take 1 capsule by mouth daily.    . Potassium Chloride ER 20 MEQ TBCR Take 40 mEq by mouth daily. 60 tablet 1  . torsemide (DEMADEX) 20 MG tablet Take 20 mg by mouth daily.    . TRUEPLUS LANCETS 33G MISC 1 each by Does not apply route 3 (three) times daily. 600 each 2  . diphenoxylate-atropine (LOMOTIL) 2.5-0.025 MG tablet Take 1 tablet by mouth 4 (four) times daily as needed for diarrhea or loose stools. 30 tablet 0  . lidocaine-prilocaine (EMLA) cream Apply to affected area once 30 g 3   No current facility-administered medications for this visit.    Facility-Administered Medications Ordered in Other Visits  Medication Dose Route Frequency Provider Last Rate Last Dose  . sodium chloride 0.9 % injection 10 mL  10 mL Intravenous PRN [name], MD   10 mL at 02/16/17 1004  . sodium chloride flush (NS) 0.9 % injection 10 mL  10 mL Intracatheter PRN [name], MD   10 mL at 06/24/17 1309  REVIEW OF SYSTEMS:   Constitutional: Denies fevers, chills or abnormal night sweats (+) weight loss  Eyes: Denies blurriness of vision, double vision or watery eyes Ears, nose, mouth, throat, and face: Denies mucositis or sore throat Respiratory: Denies cough, dyspnea or wheezes Cardiovascular: Denies palpitation, chest discomfort  Gastrointestinal:  Denies  constipation, vomiting, heartburn (+)diarrhea, max 6 per day  (+) nausea  Skin: Denies abnormal skin rashes  Lymphatics: Denies new lymphadenopathy or easy bruising Neurological:Denies numbness  MSK: (+) stable joint aches (+) mild leg cramps at night Behavioral/Psych: Mood is stable, no new changes  All other systems were reviewed with the patient and are negative.  PHYSICAL EXAMINATION:  ECOG PERFORMANCE STATUS: 2 BP (!) 166/77   Pulse 90   Temp 98.2 F (36.8 C) (Oral)   Resp 18   Ht 5' 7"  (1.702 m)   Wt 232 lb (105.2 kg)   SpO2 100%   BMI 36.34 kg/m   GENERAL:alert, no distress and comfortable SKIN: skin color, texture, turgor are normal, no rashes or significant lesions EYES: normal, conjunctiva are pink and non-injected, sclera clear OROPHARYNX:no exudate, no erythema and lips, buccal mucosa, and tongue normal  NECK: supple, thyroid normal size, non-tender, without nodularity LYMPH:  no palpable cervical or supraclavicular lymphadenopathy LUNGS: clear to auscultation bilaterally with normal breathing effort HEART: regular rate & rhythm and no murmurs; no lower extremity edema ABDOMEN:abdomen soft, non-tender and normal bowel sounds Musculoskeletal:no cyanosis of digits and no clubbing  PSYCH: alert & oriented x 3 with fluent speech NEURO: no focal motor/sensory deficits,  Breasts: exam deferred today Chest: right PAC removed, small bruising to site  LABORATORY DATA:  I have reviewed the data as listed CBC Latest Ref Rng & Units 09/08/2017 08/13/2017 07/15/2017  WBC 3.9 - 10.3 10e3/uL 7.3 7.4 7.8  Hemoglobin 11.6 - 15.9 g/dL 10.6(L) 9.8(L) 10.9(L)  Hematocrit 34.8 - 46.6 % 31.6(L) 28.6(L) 31.9(L)  Platelets 145 - 400 10e3/uL 291 272 312   CMP Latest Ref Rng & Units 09/08/2017 08/16/2017 08/13/2017  Glucose 70 - 140 mg/dl 159(H) 192(H) 149(H)  BUN 7.0 - 26.0 mg/dL 44.1(H) 29(H) 17.1  Creatinine 0.6 - 1.1 mg/dL 1.6(H) 1.30(H) 1.0  Sodium 136 - 145 mEq/L 140 136 138    Potassium 3.5 - 5.1 mEq/L 4.7 4.7 4.5  Chloride 101 - 111 mmol/L - 104 -  CO2 22 - 29 mEq/L 20(L) 25 23  Calcium 8.4 - 10.4 mg/dL 9.2 9.4 9.6  Total Protein 6.4 - 8.3 g/dL 7.9 - 7.8  Total Bilirubin 0.20 - 1.20 mg/dL 0.26 - 0.40  Alkaline Phos 40 - 150 U/L 73 - 71  AST 5 - 34 U/L 27 - 30  ALT 0 - 55 U/L 28 - 33    PATHOLOGY REPORT  Diagnosis 04/17/2016 Breast, left, needle core biopsy, inner mid breast - INVASIVE DUCTAL CARCINOMA WITH CALCIFICATIONS, SEE COMMENT. - DUCTAL CARCINOMA IN SITU WITH COMEDONECROSIS. Microscopic Comment While grading is best performed on the resection specimen the invasive carcinoma appears grade 2. Prognostic markers will be ordered and reported in an addendum. Dr. Lyndon Code has reviewed the case. The case was called to [facility] on 04/20/2016. Results: HER2 - *EQUIVOCAL* (Her2 by IHC will be ordered.) RATIO OF HER2/CEP17 SIGNALS 1.73 AVERAGE HER2 COPY NUMBER PER CELL 4.15 By immunohistochemistry, the tumor cells are positive for Her2 (3). Results: IMMUNOHISTOCHEMICAL AND MORPHOMETRIC ANALYSIS PERFORMED MANUALLY Estrogen Receptor: 100%, POSITIVE, STRONG STAINING INTENSITY Progesterone Receptor: 15%, POSITIVE, STRONG STAINING INTENSITY Proliferation  Marker Ki67: 10%  Diagnosis 05/15/2016 Lymph node, needle/core biopsy, right axilla - BENIGN REACTIVE LYMPH NODE. - NO GRANULOMAS OR MALIGNANCY IDENTIFIED. - SEE COMMENT. Microscopic Comment Internal departmental review obtained (Dr. Lyndon Code) with agreement. Results are phoned to [place] (05/18/16). (MEG:gt, 05/18/16)  Breast, simple mastectomy, Left 05/28/2016 - INVASIVE DUCTAL CARCINOMA, GRADE III/III, SPANNING 3.6 CM. - DUCTAL CARCINOMA IN SITU, HIGH GRADE. - LYMPHOVASCULAR INVASION IS IDENTIFIED. - THE SURGICAL RESECTION MARGINS ARE NEGATIVE FOR CARCINOMA. - SEE ONCOLOGY TABLE BELOW. Microscopic Comment BREAST, INVASIVE TUMOR, WITHOUT LYMPH NODES  PRESENT Specimen, including laterality : Left breast Procedure: Simple mastectomy Histologic type: Ductal with micropapillary features Grade: III Tubule formation: 3 Nuclear pleomorphism: 2 Mitotic: 3 Tumor size (gross measurement): 3.6 cm Margins: Greater than 0.2 cm to all margins Lymphovascular invasion: Present Ductal carcinoma in situ: Present Grade: High grade Extensive intraductal component: Yes Lobular neoplasia: Not identified Tumor focality: Unifocal Treatment effect: N/A Extent of tumor: Confined to breast parenchyma. Breast prognostic profile: [phone] Estrogen receptor: 100%, strong staining intensity Progesterone receptor: 15%, strong staining intensity Her 2 neu: Amplification was detected by immunohistochemistry. Ki-67: 10% 1 of 2 FINAL for FIDELIS, LOTH ([id number]) Microscopic Comment(continued) Non-neoplastic breast: No significant findings. TNM: pT2, pNX (clinically recurrent). Comments: In addition to the main tumor, there is ductal carcinoma in situ present in random tissue submitted from the inferior lateral quadrant. (JBK:kh 06-01-16)  PROCEDURES  ECHO 07/01/17 Study Conclusions - Left ventricle: The cavity size was normal. Systolic function was   normal. The estimated ejection fraction was in the range of 55%   to 60%. Wall motion was normal; there were no regional wall   motion abnormalities. Doppler parameters are consistent with   abnormal left ventricular relaxation (grade 1 diastolic   dysfunction). - Aortic valve: Transvalvular velocity was within the normal range.   There was no stenosis. There was no regurgitation. - Mitral valve: Calcified annulus. Transvalvular velocity was   within the normal range. There was no evidence for stenosis.   There was no regurgitation. - Left atrium: The atrium was mildly dilated. - Atrial septum: The septum bowed from left to right, consistent   with increased left atrial pressure. - Tricuspid  valve: There was mild regurgitation. - Pulmonary arteries: Systolic pressure was within the normal   range. PA peak pressure: 24 mm Hg (S). - Global longitudinal strain -20.5% (normal).   RADIOGRAPHIC STUDIES: I have personally reviewed the radiological images as listed and agreed with the findings in the report. No results found.   Bone Density 04/29/2017 DualFemur Neck Right 04/29/2017    65.9         -0.8    0.928 g/cm2 AP Spine  L1-L4      04/29/2017    65.9         1.7     1.406 g/cm2  Mammogram 04/29/2017 IMPRESSION: No mammographic evidence of malignancy. A result letter of this screening mammogram will be mailed directly to the patient.  DG Chest 2 View 10/28/2016 IMPRESSION: No acute cardiopulmonary abnormality.  ASSESSMENT & PLAN:  66 y.o. 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  1. Cancer of central portion of left breast, pT2NxM0, stage IIA, G3, triple positive - pathology findings showed a 3.6 cm mass, grade 3, surgical margins were negative. She had no sentinel lymph node biopsy due to her prior history of axillary lymph node  dissection. -Her restaging CT scan of bone scan showed no evidence of distant metastasis, I reviewed with patient. -she has completed adjuvant TCHP (docetaxel, carbotaxol, Herceptin, pejeta), and one year Herceptin therapy - her last echo was 5/18, EF normal  -She had per PAC removed 08/19/17  -she will continue letrozol for total 7 years -She began Nerlynx on 08/26/2017, 240 mg daily -she is experiencing mild fatigue and moderate diarrhea, up to 6 episodes per day, she takes imodium only 3 times per day -I encouraged her to increase imodium up to 8 tablets per day, prophylaxis is recommended but she can begin with the first sign her bowels are becoming loose.  -I gave her lomotil prescription if imodium is not enough. I  have encouraged her to drink more to prevent dehydration in the setting of increased diarrhea.  -her creatinine is elevated at 1.6 today, I will arrange for IVF infusion today in our clinic.  -She will hold Nerlynx today and resume next week 09/13/17 for 2 more weeks.  -I will see her next week with lab, if her diarrhea is uncontrolled or her creatinine is again elevated, I will stop Nerlynx.  -Continue breast cancer surveillance. She is clinically doing well, previous exam unremarkable, last mammogram in May 2018 was negative. No clinical concern for recurrence.  -She started Nerlyx on 08/26/17 and has not tolerated well due to severe diarrhea. Based on her aggressive cancer she is willing to try again. I suggest she takes imodium 6 tablets daily as prophylactic and I will call in Lomotil to take as needed.  --Labs reviewed and she is still slightly anemic with Hb 10.6 -F/u in 1 week   2. Anemia  -Secondary to chemotherapy, her hemoglobin was previously 9.7, she has mild symptoms from anemia -Her H/H was normal before chemo  -Will continue to monitor.  -Improved slightly to 10.6 (09/08/17)    3. DM and HTN  -She'll continue follow-up with her primary care physician -She is aware her blood pressure and glucose level may be impacted by chemotherapy,  -previously encouraged to monitor her blood pressure and glucose at home, and consider increasing the dose of metformin and glyburide if needed -her BP and blood glucose has been normal/well controlled lately  4. Obesity  -I previously encouraged her to eat healthy and exercise regularly, she is willing to lose some weight after she completes chemotherapy.  5. Peripheral neuropathy, G1 -Secondary to chemotherapy, especially docetaxel -Previously declined MD recommendation to take vitamin B complex  6. Fatigue  -Probably related to her previous chemotherapy and anemia -I again encouraged her to gradually increase her exercise as tolerated  once diarrhea subsides  7. Bone Health -She has been informed that Letrozole can cause some bone weakening  -She previously has had a bone density scan in the past, but it was several years ago -repeated DEXA scan in 03/2017 showed normal bone density  -She takes calcium and vitamin D -Her Bone density scan from 04/29/17 her Femur Neck Right T-score is -0.8. This patient is considered normal. -Will repeat DEXA in 2020   8. Diarrhea secondary to Neratinib  -We reviewed the management of her diarrhea from medication, she will use Imodium 6-8 tablets a day when she takes neratinib  -I also given a prescription of Lomotil she will use as needed  9. AKI -Her creatinine went up to 1.6 today, crit is within normal A month ago. -This is likely secondary to her diarrhea from medication -Will give IV fluids  in the clinic today, I strongly encouraged her to drink more water  -LAB and f/u next week   Plan -hold Nerlynx today until 09/13/17 -poor venous access prevented IVF support today, she will increase po intake at home -Prescription for Lomotil given today  -Lab and f/u 1 week  -Restart Nerlynx on 09/13/17 if she recovers well; if diarrhea remains poorly controlled or if renal function again decreased, will stop nerlynx -Continue letrozole    All questions were answered. The patient knows to call the clinic with any problems, questions or concerns.   Cira Rue, AGNP-C 09/08/2017  I have seen the patient, examined her. I agree with the assessment and and plan and have edited the notes.   I spent 30 minutes counseling the patient face to face. The total time spent in the appointment was 40 minutes and more than 50% was on counseling.  [name]  09/08/2017

## 2017-09-08 ENCOUNTER — Encounter: Payer: Self-pay | Admitting: Hematology

## 2017-09-08 ENCOUNTER — Other Ambulatory Visit (HOSPITAL_BASED_OUTPATIENT_CLINIC_OR_DEPARTMENT_OTHER): Payer: Medicare PPO

## 2017-09-08 ENCOUNTER — Ambulatory Visit (HOSPITAL_BASED_OUTPATIENT_CLINIC_OR_DEPARTMENT_OTHER): Payer: Medicare PPO | Admitting: Hematology

## 2017-09-08 ENCOUNTER — Other Ambulatory Visit: Payer: Medicare PPO

## 2017-09-08 ENCOUNTER — Telehealth: Payer: Self-pay | Admitting: Hematology

## 2017-09-08 ENCOUNTER — Ambulatory Visit: Payer: Medicare PPO | Admitting: Hematology

## 2017-09-08 VITALS — BP 166/77 | HR 90 | Temp 98.2°F | Resp 18 | Ht 67.0 in | Wt 232.0 lb

## 2017-09-08 DIAGNOSIS — D6481 Anemia due to antineoplastic chemotherapy: Secondary | ICD-10-CM | POA: Diagnosis not present

## 2017-09-08 DIAGNOSIS — Z170 Estrogen receptor positive status [ER+]: Secondary | ICD-10-CM | POA: Diagnosis not present

## 2017-09-08 DIAGNOSIS — E119 Type 2 diabetes mellitus without complications: Secondary | ICD-10-CM | POA: Diagnosis not present

## 2017-09-08 DIAGNOSIS — Z79811 Long term (current) use of aromatase inhibitors: Secondary | ICD-10-CM

## 2017-09-08 DIAGNOSIS — C50112 Malignant neoplasm of central portion of left female breast: Secondary | ICD-10-CM

## 2017-09-08 DIAGNOSIS — R197 Diarrhea, unspecified: Secondary | ICD-10-CM

## 2017-09-08 DIAGNOSIS — I10 Essential (primary) hypertension: Secondary | ICD-10-CM

## 2017-09-08 DIAGNOSIS — G620 Drug-induced polyneuropathy: Secondary | ICD-10-CM

## 2017-09-08 DIAGNOSIS — Z853 Personal history of malignant neoplasm of breast: Secondary | ICD-10-CM

## 2017-09-08 LAB — CBC WITH DIFFERENTIAL/PLATELET
BASO%: 0.7 % (ref 0.0–2.0)
Basophils Absolute: 0.1 10*3/uL (ref 0.0–0.1)
EOS%: 3.2 % (ref 0.0–7.0)
Eosinophils Absolute: 0.2 10*3/uL (ref 0.0–0.5)
HCT: 31.6 % — ABNORMAL LOW (ref 34.8–46.6)
HGB: 10.6 g/dL — ABNORMAL LOW (ref 11.6–15.9)
LYMPH%: 29.4 % (ref 14.0–49.7)
MCH: 34.2 pg — ABNORMAL HIGH (ref 25.1–34.0)
MCHC: 33.7 g/dL (ref 31.5–36.0)
MCV: 101.6 fL — ABNORMAL HIGH (ref 79.5–101.0)
MONO#: 0.4 10*3/uL (ref 0.1–0.9)
MONO%: 5.6 % (ref 0.0–14.0)
NEUT#: 4.5 10*3/uL (ref 1.5–6.5)
NEUT%: 61.1 % (ref 38.4–76.8)
Platelets: 291 10*3/uL (ref 145–400)
RBC: 3.11 10*6/uL — ABNORMAL LOW (ref 3.70–5.45)
RDW: 13.6 % (ref 11.2–14.5)
WBC: 7.3 10*3/uL (ref 3.9–10.3)
lymph#: 2.1 10*3/uL (ref 0.9–3.3)

## 2017-09-08 LAB — COMPREHENSIVE METABOLIC PANEL
ALT: 28 U/L (ref 0–55)
AST: 27 U/L (ref 5–34)
Albumin: 3.9 g/dL (ref 3.5–5.0)
Alkaline Phosphatase: 73 U/L (ref 40–150)
Anion Gap: 10 mEq/L (ref 3–11)
BUN: 44.1 mg/dL — ABNORMAL HIGH (ref 7.0–26.0)
CO2: 20 mEq/L — ABNORMAL LOW (ref 22–29)
Calcium: 9.2 mg/dL (ref 8.4–10.4)
Chloride: 110 mEq/L — ABNORMAL HIGH (ref 98–109)
Creatinine: 1.6 mg/dL — ABNORMAL HIGH (ref 0.6–1.1)
EGFR: 38 mL/min/{1.73_m2} — ABNORMAL LOW (ref >60)
Glucose: 159 mg/dl — ABNORMAL HIGH (ref 70–140)
Potassium: 4.7 mEq/L (ref 3.5–5.1)
Sodium: 140 mEq/L (ref 136–145)
Total Bilirubin: 0.26 mg/dL (ref 0.20–1.20)
Total Protein: 7.9 g/dL (ref 6.4–8.3)

## 2017-09-08 MED ORDER — DIPHENOXYLATE-ATROPINE 2.5-0.025 MG PO TABS: 1.0000 | ORAL_TABLET | Freq: Four times a day (QID) | ORAL | 0 refills | Status: DC | PRN

## 2017-09-08 NOTE — Telephone Encounter (Signed)
Gave patient avs report and appointments for October. Per patient no fluids today - they could not find a vein and she needed to leave. Left message for nurse.

## 2017-09-08 NOTE — Progress Notes (Signed)
Pt to receive IVF today after office visit.  Attempted IV sticks x 2 in right forearm unsuccessfully.  Catheters d/c intact, sites unremarkable.  Second nurse Abelina Bachelor, RN also attempted IV sticks x 2 unsuccessfully.  Dr. Burr Medico, and Regan Rakers, NP notified.  OK to discharge pt home without IVF.  Instructed pt to increase po fluids as tolerated.  Pt can drink fine.  Pt voiced understanding.  Pt was stable at discharge , ambulatory by self with steady gait.

## 2017-09-09 ENCOUNTER — Other Ambulatory Visit: Payer: Self-pay | Admitting: Hematology

## 2017-09-09 ENCOUNTER — Other Ambulatory Visit: Payer: Self-pay | Admitting: *Deleted

## 2017-09-09 DIAGNOSIS — C50112 Malignant neoplasm of central portion of left female breast: Secondary | ICD-10-CM

## 2017-09-09 MED ORDER — POTASSIUM CHLORIDE ER 20 MEQ PO TBCR: 40.0000 meq | EXTENDED_RELEASE_TABLET | Freq: Every day | ORAL | 1 refills | Status: DC

## 2017-09-14 ENCOUNTER — Telehealth: Payer: Self-pay | Admitting: Nurse Practitioner

## 2017-09-14 ENCOUNTER — Other Ambulatory Visit (HOSPITAL_BASED_OUTPATIENT_CLINIC_OR_DEPARTMENT_OTHER): Payer: Medicare PPO

## 2017-09-14 ENCOUNTER — Encounter: Payer: Self-pay | Admitting: Nurse Practitioner

## 2017-09-14 ENCOUNTER — Ambulatory Visit (HOSPITAL_BASED_OUTPATIENT_CLINIC_OR_DEPARTMENT_OTHER): Payer: Medicare PPO | Admitting: Nurse Practitioner

## 2017-09-14 VITALS — BP 149/61 | HR 93 | Temp 97.6°F | Resp 17 | Ht 67.0 in | Wt 232.1 lb

## 2017-09-14 DIAGNOSIS — Z23 Encounter for immunization: Secondary | ICD-10-CM

## 2017-09-14 DIAGNOSIS — N179 Acute kidney failure, unspecified: Secondary | ICD-10-CM | POA: Diagnosis not present

## 2017-09-14 DIAGNOSIS — R110 Nausea: Secondary | ICD-10-CM | POA: Diagnosis not present

## 2017-09-14 DIAGNOSIS — G620 Drug-induced polyneuropathy: Secondary | ICD-10-CM

## 2017-09-14 DIAGNOSIS — C50112 Malignant neoplasm of central portion of left female breast: Secondary | ICD-10-CM | POA: Diagnosis not present

## 2017-09-14 DIAGNOSIS — R197 Diarrhea, unspecified: Secondary | ICD-10-CM | POA: Diagnosis not present

## 2017-09-14 DIAGNOSIS — D649 Anemia, unspecified: Secondary | ICD-10-CM

## 2017-09-14 DIAGNOSIS — R630 Anorexia: Secondary | ICD-10-CM

## 2017-09-14 DIAGNOSIS — E119 Type 2 diabetes mellitus without complications: Secondary | ICD-10-CM

## 2017-09-14 DIAGNOSIS — I10 Essential (primary) hypertension: Secondary | ICD-10-CM

## 2017-09-14 DIAGNOSIS — R5383 Other fatigue: Secondary | ICD-10-CM | POA: Diagnosis not present

## 2017-09-14 LAB — CBC WITH DIFFERENTIAL/PLATELET
BASO%: 0.4 % (ref 0.0–2.0)
Basophils Absolute: 0 10*3/uL (ref 0.0–0.1)
EOS%: 4.1 % (ref 0.0–7.0)
Eosinophils Absolute: 0.3 10*3/uL (ref 0.0–0.5)
HCT: 31.1 % — ABNORMAL LOW (ref 34.8–46.6)
HGB: 10.4 g/dL — ABNORMAL LOW (ref 11.6–15.9)
LYMPH%: 31.7 % (ref 14.0–49.7)
MCH: 33.5 pg (ref 25.1–34.0)
MCHC: 33.4 g/dL (ref 31.5–36.0)
MCV: 100.3 fL (ref 79.5–101.0)
MONO#: 0.4 10*3/uL (ref 0.1–0.9)
MONO%: 5.6 % (ref 0.0–14.0)
NEUT#: 4.3 10*3/uL (ref 1.5–6.5)
NEUT%: 58.2 % (ref 38.4–76.8)
Platelets: 233 10*3/uL (ref 145–400)
RBC: 3.1 10*6/uL — ABNORMAL LOW (ref 3.70–5.45)
RDW: 12.8 % (ref 11.2–14.5)
WBC: 7.3 10*3/uL (ref 3.9–10.3)
lymph#: 2.3 10*3/uL (ref 0.9–3.3)

## 2017-09-14 LAB — COMPREHENSIVE METABOLIC PANEL
ALT: 27 U/L (ref 0–55)
AST: 27 U/L (ref 5–34)
Albumin: 3.9 g/dL (ref 3.5–5.0)
Alkaline Phosphatase: 68 U/L (ref 40–150)
Anion Gap: 8 mEq/L (ref 3–11)
BUN: 35.2 mg/dL — ABNORMAL HIGH (ref 7.0–26.0)
CO2: 20 mEq/L — ABNORMAL LOW (ref 22–29)
Calcium: 9.6 mg/dL (ref 8.4–10.4)
Chloride: 110 mEq/L — ABNORMAL HIGH (ref 98–109)
Creatinine: 1.4 mg/dL — ABNORMAL HIGH (ref 0.6–1.1)
EGFR: 44 mL/min/{1.73_m2} — ABNORMAL LOW (ref >60)
Glucose: 118 mg/dl (ref 70–140)
Potassium: 5.1 mEq/L (ref 3.5–5.1)
Sodium: 137 mEq/L (ref 136–145)
Total Bilirubin: <0.22 mg/dL (ref 0.20–1.20)
Total Protein: 8 g/dL (ref 6.4–8.3)

## 2017-09-14 MED ORDER — INFLUENZA VAC SPLIT HIGH-DOSE 0.5 ML IM SUSY
0.5000 mL | PREFILLED_SYRINGE | Freq: Once | INTRAMUSCULAR | Status: AC
  Administered 2017-09-14: 0.5 mL via INTRAMUSCULAR
  Filled 2017-09-14: qty 0.5

## 2017-09-14 MED ORDER — DIPHENOXYLATE-ATROPINE 2.5-0.025 MG PO TABS
1.0000 | ORAL_TABLET | Freq: Four times a day (QID) | ORAL | 0 refills | Status: DC | PRN
Start: 2017-09-14 — End: 2018-01-04

## 2017-09-14 MED ORDER — ONDANSETRON 4 MG PO TBDP: 4.0000 mg | ORAL_TABLET | Freq: Three times a day (TID) | ORAL | 1 refills | Status: DC | PRN

## 2017-09-14 NOTE — Progress Notes (Signed)
[place]  Telephone:(336) [phone] Fax:(336) [phone]  Clinic Follow up Note   Patient Care Team: [name], NP as PCP - General (Internal Medicine) [name], MD as Consulting Physician (General Surgery) 09/14/2017  SUMMARY OF ONCOLOGIC HISTORY: 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 [facility])   Staging form: Breast, AJCC 7th Edition     Clinical stage from 04/17/2016: Stage IA (T1c, N0, M0) - Signed by [name], MD on 05/07/2016     Pathologic stage from 05/28/2016: Stage IIA (T2, N0, cM0) - Signed by [name], MD on 06/11/2016 History of left breast cancer   Staging form: Breast, AJCC 7th Edition     Clinical: Stage IIIB (T4d, N1, cM0) - Signed by [name], MD on 12/14/2013     Pathologic: Stage IIIB (T4d, N1a, cM0) - Signed by [name], MD on 12/14/2013       History of left breast cancer   09/12/2002 Imaging    CT scan show no evidence of disease apart from the left axilla      09/2002 -  Neo-Adjuvant Chemotherapy    TAC X4 cycles       01/10/2003 Surgery    The patient had left lumpectomy and axillary lymph node dissection which show residual breast cancer and a sentinel lymph node was positive      2004 -  Adjuvant Chemotherapy    Cytoxan with 5-FU x4 cycles (methotrexate added at cycle 4).      2004 -  Radiation Therapy    Adjuvant left breast radiation      12/2002 Receptors her2    ER, PR and HER-2 were all negative.       Cancer of central portion of left breast ([place])   04/13/2016 Mammogram    Scheduler coarse heterogeneous calcifications spanning an area of 3.7 cm in the lumpectomy site, indeterminate. Ultrasound of the left axilla was negative.      04/17/2016 Initial Diagnosis    Cancer of central portion of left breast ([place])      04/17/2016 Initial Biopsy    Left breast core needle biopsy showed invasive and in situ ductal carcinoma with calcification, grade 2.      04/17/2016 Receptors her2    Your  100% positive, PR 15% positive, strong staining, Ki-67 10%, HER-2 positive by IHC (3)      05/05/2016 Imaging    Bilateral breast MRI showed a 1.3 x 0.8 x 0.7 cm biopsy-proven ductal carcinoma in the region of the previous lumpectomy. Enlarged lobulated right inferior axillary lymph node with diffuse cortical thickening, biopsy is recommended.      05/15/2016 Pathology Results    right axilary node biopsy was negative for malignant cell       05/28/2016 Surgery    Simple left mastectomy      05/28/2016 Pathology Results    Left mastectomy showed invasive ductal carcinoma, grade 3, 3.6 cm, high grade DCIS, (+) LVI, surgical margins were negative. Tumor 3.6 cm, no lymph nodes identified.      06/25/2016 Imaging    Staging CT scan of the chest, abdomen and pelvis with contrast and a bone scan showed no evidence of metastasis. Postoperative fluid collection in the left breast, likely a seroma or hematoma. Right axillary adenopathy, which was biopsied previously.       07/07/2016 - 07/15/2017 Chemotherapy    Adjuvant chemotherapy with docetaxel, carboplatin, Herceptin and pejeta every 3 weeks, for 6 cycles, followed  by Herceptin maintenance therapy for total of one year treatment  Ended [facility] 11/04/16        12/15/2016 -  Anti-estrogen oral therapy    Adjuvant letrozole 2.5 mg once daily      04/29/2017 Mammogram     Mammogram 04/29/2017 IMPRESSION: No mammographic evidence of malignancy. A result letter of this screening mammogram will be mailed directly to the patient.      CURRENT THERAPY:  Letrozole 2.5 mg daily, started on 12/15/2016, Nerlynx 240 mg daily began on 08/26/17.  INTERVAL HISTORY: Mrs. Lindsey Marquez returns today for follow-up as scheduled. She was last seen on 09/08/2017, when she was instructed to hold nerlynx due to elevated creatinine and frequent diarrhea. Today she reports she continued to take the medication. She continues to have diarrhea with slight improvement overall, she  is taking up to 6 imodium per day now. She did not fill lomotil given at last office visit. She has 2-4 episodes of diarrhea daily, no urgency or incontinence. She continues to have mild nausea and decreased appetite, she is drinking fluids. She has mild fatigue, functions normally with effort. Stable joint aches. She denies fever, chills, cough, shortness of breath, arm or leg swelling.  REVIEW OF SYSTEMS:   Constitutional: Denies fevers, chills or abnormal weight loss (+) mild fatigue (+) decreased appetite  Eyes: Denies blurriness of vision Ears, nose, mouth, throat, and face: Denies mucositis or sore throat Respiratory: Denies cough, dyspnea or wheezes Cardiovascular: Denies palpitation, chest discomfort or lower extremity swelling Gastrointestinal:  Denies constipation, vomiting, or heartburn (+) intermittent mild nausea (+) diarrhea, 2-4 episodes per day on imodium  Skin: Denies abnormal skin rashes Lymphatics: Denies new lymphadenopathy or easy bruising Neurological:Denies numbness, tingling or new weaknesses Behavioral/Psych: Mood is stable, no new changes  All other systems were reviewed with the patient and are negative.  MEDICAL HISTORY:  Past Medical History:  Diagnosis Date  . Blood transfusion without reported diagnosis   . Breast cancer [facility]) August 2002   Invasive ductal carcinoma Left breast. 2002, 2017  . Diabetes mellitus without complication ([place])   . Family history of malignant neoplasm of ovary   . Family history of pancreatic cancer   . Hypertension     SURGICAL HISTORY: Past Surgical History:  Procedure Laterality Date  . BREAST SURGERY Left 2003  . COLONOSCOPY    . FOOT SURGERY  2000  . MASTECTOMY Left 05/28/2016  . port a cath insertion  05/28/2016  . PORT-A-CATH REMOVAL Right 08/19/2017   Procedure: REMOVAL PORT-A-CATH;  Surgeon: [name], MD;  Location: [place];  Service: General;  Laterality: Right;  . PORTACATH PLACEMENT Right  05/28/2016   Procedure: INSERTION PORT-A-CATH;  Surgeon: [name], MD;  Location: [place];  Service: General;  Laterality: Right;  . TOTAL MASTECTOMY Left 05/28/2016   Procedure: LEFT  MASTECTOMY;  Surgeon: [name], MD;  Location: [place];  Service: General;  Laterality: Left;  . TUBAL LIGATION  22 yrs since  2004.   Bilateral.    I have reviewed the social history and family history with the patient and they are unchanged from previous note.  ALLERGIES:  is allergic to codeine.  MEDICATIONS:  Current Outpatient Prescriptions  Medication Sig Dispense Refill  . aspirin 81 MG tablet Take 81 mg by mouth daily.     . diphenoxylate-atropine (LOMOTIL) 2.5-0.025 MG tablet Take 1 tablet by mouth 4 (four) times daily as needed for diarrhea or loose stools. 30 tablet 0  .  glucose blood (TRUE METRIX BLOOD GLUCOSE TEST) test strip 1 each by Other route 3 (three) times daily. Use as instructed 600 each 2  . glyBURIDE-metformin (GLUCOVANCE) 2.5-500 MG tablet TAKE 2 TABLETS BY MOUTH 2 (TWO) TIMES DAILY WITH A MEAL. 360 tablet 1  . ibuprofen (ADVIL,MOTRIN) 200 MG tablet Take 200 mg by mouth every 8 (eight) hours as needed.      Marland Kitchen letrozole (FEMARA) 2.5 MG tablet Take 1 tablet (2.5 mg total) by mouth daily. 30 tablet 3  . lidocaine-prilocaine (EMLA) cream Apply to affected area once 30 g 3  . lisinopril-hydrochlorothiazide (PRINZIDE,ZESTORETIC) 20-25 MG tablet TAKE 1 TABLET BY MOUTH DAILY. 90 tablet 0  . Neratinib Maleate (NERLYNX) 40 MG tablet Take 6 tablets (240 mg total) by mouth daily. Take with food. 180 tablet 1  . Omega-3 Fatty Acids (FISH OIL) 1200 MG CAPS Take 1 capsule by mouth daily.    . Potassium Chloride ER 20 MEQ TBCR Take 40 mEq by mouth daily. 60 tablet 1  . torsemide (DEMADEX) 20 MG tablet Take 20 mg by mouth daily.    . TRUEPLUS LANCETS 33G MISC 1 each by Does not apply route 3 (three) times daily. 600 each 2  . ondansetron (ZOFRAN-ODT) 4 MG disintegrating tablet Take 1 tablet (4  mg total) by mouth every 8 (eight) hours as needed for nausea or vomiting. 20 tablet 1   No current facility-administered medications for this visit.    Facility-Administered Medications Ordered in Other Visits  Medication Dose Route Frequency Provider Last Rate Last Dose  . sodium chloride 0.9 % injection 10 mL  10 mL Intravenous PRN [name], MD   10 mL at 02/16/17 1004  . sodium chloride flush (NS) 0.9 % injection 10 mL  10 mL Intracatheter PRN [name], MD   10 mL at 06/24/17 1309    PHYSICAL EXAMINATION: ECOG PERFORMANCE STATUS: 2 - Symptomatic, <50% confined to bed  Vitals:   09/14/17 1032  BP: (!) 149/61  Pulse: 93  Resp: 17  Temp: 97.6 F (36.4 C)  SpO2: 100%   Filed Weights   09/14/17 1032  Weight: 232 lb 1.6 oz (105.3 kg)    GENERAL:alert, no distress and comfortable SKIN: skin color, texture, turgor are normal, no rashes or significant lesions EYES: normal, Conjunctiva are pink and non-injected, sclera clear OROPHARYNX:no exudate, no erythema and lips, buccal mucosa, and tongue normal  NECK: supple, thyroid normal size, non-tender, without nodularity LYMPH:  no palpable cervical, supraclavicular, axillary, or inguinal lymphadenopathy  LUNGS: clear to auscultation bilaterally with normal breathing effort HEART: regular rate & rhythm, S1 and S2 present, no murmurs and no lower extremity edema ABDOMEN:abdomen soft, non-tender and normal bowel sounds. No palpable hepatosplenomegaly or masses. Musculoskeletal:no cyanosis of digits and no clubbing   NEURO: alert & oriented x 3 with fluent speech, no focal motor/sensory deficits Breasts: breast inspection shows left breast to be surgically absent, surgical incision has healed well. Exam of right breast and bilateral axillas revealed no palpable mass or adenopathy.  LABORATORY DATA:  I have reviewed the data as listed CBC Latest Ref Rng & Units 09/14/2017 09/08/2017 08/13/2017  WBC 3.9 - 10.3 10e3/uL 7.3 7.3 7.4    Hemoglobin 11.6 - 15.9 g/dL 10.4(L) 10.6(L) 9.8(L)  Hematocrit 34.8 - 46.6 % 31.1(L) 31.6(L) 28.6(L)  Platelets 145 - 400 10e3/uL 233 291 272    CMP Latest Ref Rng & Units 09/14/2017 09/08/2017 08/16/2017  Glucose 70 - 140 mg/dl 118 159(H) 192(H)  BUN 7.0 - 26.0 mg/dL 35.2(H) 44.1(H) 29(H)  Creatinine 0.6 - 1.1 mg/dL 1.4(H) 1.6(H) 1.30(H)  Sodium 136 - 145 mEq/L 137 140 136  Potassium 3.5 - 5.1 mEq/L 5.1 4.7 4.7  Chloride 101 - 111 mmol/L - - 104  CO2 22 - 29 mEq/L 20(L) 20(L) 25  Calcium 8.4 - 10.4 mg/dL 9.6 9.2 9.4  Total Protein 6.4 - 8.3 g/dL 8.0 7.9 -  Total Bilirubin 0.20 - 1.20 mg/dL <0.22 0.26 -  Alkaline Phos 40 - 150 U/L 68 73 -  AST 5 - 34 U/L 27 27 -  ALT 0 - 55 U/L 27 28 -    RADIOGRAPHIC STUDIES: I have personally reviewed the radiological images as listed and agreed with the findings in the report. No results found.   ASSESSMENT & PLAN:  66 y.o. 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  1. 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 Anemia  3. DM and HTN  4. Obesity  5. Peripheral neuropathy, G1 6. Fatigue  7. Bone Health 8. Diarrhea secondary to Neratinib  9. AKI  She appears slightly better today with improvement in diarrhea although she continues to have 2-4 episodes per day. She will continue imodium and will fill lomotil prescription given today and alternate medications for maximum benefit, we reviewed medication regimen today.  I refilled zofran for her nausea, I instructed her to take PRN 30 minutes prior to meal to improve po intake. Her weight is stable overall. Her CBC is stable with mild anemia today, we will continue to monitor. . Her renal function is improved but remains elevated, creatinine 1.4. She will increase po liquid intake at home. Her exam is unremarkable without clinical  concern for recurrence. I recommend to hold nerlynx for 1 week to improve GI side effects and renal function; return for labs and f/u on 10/23 with me.  PLAN:  -Hold nerlynx x1 week, return for lab and f/u with APP 10/23 -Prescriptions for lomotil, zofran -Increase imodium to 8 tabs per day and alternate with lomotil for optimal relief  All questions were answered. The patient knows to call the clinic with any problems, questions or concerns. No barriers to learning was detected. I spent 25 counseling the patient face to face. The total time spent in the appointment was 30 and more than 50% was on counseling and review of test results     Alla Feeling, NP 09/14/17

## 2017-09-14 NOTE — Telephone Encounter (Signed)
Scheduled appt per 10/16 los - Gave patient AVS and calender per los.  

## 2017-09-19 ENCOUNTER — Other Ambulatory Visit: Payer: Self-pay | Admitting: Hematology

## 2017-09-21 ENCOUNTER — Telehealth: Payer: Self-pay | Admitting: Nurse Practitioner

## 2017-09-21 ENCOUNTER — Ambulatory Visit (HOSPITAL_BASED_OUTPATIENT_CLINIC_OR_DEPARTMENT_OTHER): Payer: Medicare PPO | Admitting: Nurse Practitioner

## 2017-09-21 ENCOUNTER — Encounter: Payer: Self-pay | Admitting: Nurse Practitioner

## 2017-09-21 ENCOUNTER — Other Ambulatory Visit (HOSPITAL_BASED_OUTPATIENT_CLINIC_OR_DEPARTMENT_OTHER): Payer: Medicare PPO

## 2017-09-21 VITALS — BP 135/64 | HR 93 | Temp 98.3°F | Resp 18 | Ht 67.0 in | Wt 235.2 lb

## 2017-09-21 DIAGNOSIS — Z79811 Long term (current) use of aromatase inhibitors: Secondary | ICD-10-CM | POA: Diagnosis not present

## 2017-09-21 DIAGNOSIS — E119 Type 2 diabetes mellitus without complications: Secondary | ICD-10-CM

## 2017-09-21 DIAGNOSIS — Z170 Estrogen receptor positive status [ER+]: Secondary | ICD-10-CM

## 2017-09-21 DIAGNOSIS — I10 Essential (primary) hypertension: Secondary | ICD-10-CM | POA: Diagnosis not present

## 2017-09-21 DIAGNOSIS — C50112 Malignant neoplasm of central portion of left female breast: Secondary | ICD-10-CM | POA: Diagnosis not present

## 2017-09-21 DIAGNOSIS — D649 Anemia, unspecified: Secondary | ICD-10-CM | POA: Diagnosis not present

## 2017-09-21 DIAGNOSIS — G629 Polyneuropathy, unspecified: Secondary | ICD-10-CM

## 2017-09-21 LAB — CBC WITH DIFFERENTIAL/PLATELET
BASO%: 0.2 % (ref 0.0–2.0)
Basophils Absolute: 0 10*3/uL (ref 0.0–0.1)
EOS%: 3.3 % (ref 0.0–7.0)
Eosinophils Absolute: 0.3 10*3/uL (ref 0.0–0.5)
HCT: 28.1 % — ABNORMAL LOW (ref 34.8–46.6)
HGB: 9.4 g/dL — ABNORMAL LOW (ref 11.6–15.9)
LYMPH%: 35.7 % (ref 14.0–49.7)
MCH: 33.3 pg (ref 25.1–34.0)
MCHC: 33.5 g/dL (ref 31.5–36.0)
MCV: 99.6 fL (ref 79.5–101.0)
MONO#: 0.7 10*3/uL (ref 0.1–0.9)
MONO%: 7.6 % (ref 0.0–14.0)
NEUT#: 5.1 10*3/uL (ref 1.5–6.5)
NEUT%: 53.2 % (ref 38.4–76.8)
Platelets: 232 10*3/uL (ref 145–400)
RBC: 2.82 10*6/uL — ABNORMAL LOW (ref 3.70–5.45)
RDW: 13.1 % (ref 11.2–14.5)
WBC: 9.6 10*3/uL (ref 3.9–10.3)
lymph#: 3.4 10*3/uL — ABNORMAL HIGH (ref 0.9–3.3)

## 2017-09-21 LAB — COMPREHENSIVE METABOLIC PANEL
ALT: 23 U/L (ref 0–55)
AST: 21 U/L (ref 5–34)
Albumin: 3.6 g/dL (ref 3.5–5.0)
Alkaline Phosphatase: 66 U/L (ref 40–150)
Anion Gap: 9 mEq/L (ref 3–11)
BUN: 19.7 mg/dL (ref 7.0–26.0)
CO2: 25 mEq/L (ref 22–29)
Calcium: 8.8 mg/dL (ref 8.4–10.4)
Chloride: 107 mEq/L (ref 98–109)
Creatinine: 1.1 mg/dL (ref 0.6–1.1)
EGFR: >60 mL/min/{1.73_m2} (ref >60)
Glucose: 132 mg/dl (ref 70–140)
Potassium: 4.1 mEq/L (ref 3.5–5.1)
Sodium: 141 mEq/L (ref 136–145)
Total Bilirubin: 0.31 mg/dL (ref 0.20–1.20)
Total Protein: 7.3 g/dL (ref 6.4–8.3)

## 2017-09-21 NOTE — Progress Notes (Signed)
[place]  Telephone:(336) [phone] Fax:(336) [phone]  Clinic Follow up Note   Patient Care Team: [name], NP as PCP - General (Internal Medicine) [name], MD as Consulting Physician (General Surgery) 09/21/2017  SUMMARY OF ONCOLOGIC HISTORY: 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 [facility])   Staging form: Breast, AJCC 7th Edition     Clinical stage from 04/17/2016: Stage IA (T1c, N0, M0) - Signed by [name], MD on 05/07/2016     Pathologic stage from 05/28/2016: Stage IIA (T2, N0, cM0) - Signed by [name], MD on 06/11/2016 History of left breast cancer   Staging form: Breast, AJCC 7th Edition     Clinical: Stage IIIB (T4d, N1, cM0) - Signed by [name], MD on 12/14/2013     Pathologic: Stage IIIB (T4d, N1a, cM0) - Signed by [name], MD on 12/14/2013       History of left breast cancer   09/12/2002 Imaging    CT scan show no evidence of disease apart from the left axilla      09/2002 -  Neo-Adjuvant Chemotherapy    TAC X4 cycles       01/10/2003 Surgery    The patient had left lumpectomy and axillary lymph node dissection which show residual breast cancer and a sentinel lymph node was positive      2004 -  Adjuvant Chemotherapy    Cytoxan with 5-FU x4 cycles (methotrexate added at cycle 4).      2004 -  Radiation Therapy    Adjuvant left breast radiation      12/2002 Receptors her2    ER, PR and HER-2 were all negative.       Cancer of central portion of left breast ([place])   04/13/2016 Mammogram    Scheduler coarse heterogeneous calcifications spanning an area of 3.7 cm in the lumpectomy site, indeterminate. Ultrasound of the left axilla was negative.      04/17/2016 Initial Diagnosis    Cancer of central portion of left breast ([place])      04/17/2016 Initial Biopsy    Left breast core needle biopsy showed invasive and in situ ductal carcinoma with calcification, grade 2.      04/17/2016 Receptors her2    Your  100% positive, PR 15% positive, strong staining, Ki-67 10%, HER-2 positive by IHC (3)      05/05/2016 Imaging    Bilateral breast MRI showed a 1.3 x 0.8 x 0.7 cm biopsy-proven ductal carcinoma in the region of the previous lumpectomy. Enlarged lobulated right inferior axillary lymph node with diffuse cortical thickening, biopsy is recommended.      05/15/2016 Pathology Results    right axilary node biopsy was negative for malignant cell       05/28/2016 Surgery    Simple left mastectomy      05/28/2016 Pathology Results    Left mastectomy showed invasive ductal carcinoma, grade 3, 3.6 cm, high grade DCIS, (+) LVI, surgical margins were negative. Tumor 3.6 cm, no lymph nodes identified.      06/25/2016 Imaging    Staging CT scan of the chest, abdomen and pelvis with contrast and a bone scan showed no evidence of metastasis. Postoperative fluid collection in the left breast, likely a seroma or hematoma. Right axillary adenopathy, which was biopsied previously.       07/07/2016 - 07/15/2017 Chemotherapy    Adjuvant chemotherapy with docetaxel, carboplatin, Herceptin and pejeta every 3 weeks, for 6 cycles, followed  by Herceptin maintenance therapy for total of one year treatment  Ended [facility] 11/04/16        12/15/2016 -  Anti-estrogen oral therapy    Adjuvant letrozole 2.5 mg once daily      04/29/2017 Mammogram     Mammogram 04/29/2017 IMPRESSION: No mammographic evidence of malignancy. A result letter of this screening mammogram will be mailed directly to the patient.      CURRENT THERAPY:Letrozole 2.5 mg daily, started on 12/15/2016, Nerlynx 240 mg daily began on9/27/18.  INTERVAL HISTORY: Mrs. Forgy returns for follow-up today as scheduled. Nerlynx has been on hold 1 week. Her medication-related diarrhea has resolved on imodium and lomotil, however she is currently prepping for colonoscopy tomorrow. Over the last week she has 3 stools per day that are soft and formed. Continues to  have mild intermittent nausea controlled with Compazine. Stable joint aches in fingers and knees bilaterally.   REVIEW OF SYSTEMS:   Constitutional: Denies fatigue, fevers, chills or abnormal weight loss (+) on liquid diet today for colonoscopy prep Eyes: Denies blurriness of vision Ears, nose, mouth, throat, and face: Denies mucositis or sore throat Respiratory: Denies cough, dyspnea or wheezes Cardiovascular: Denies palpitation, chest discomfort or lower extremity swelling Gastrointestinal:  Denies nausea, vomiting, constipation, diarrhea, heartburn or change in bowel habits. Denies ABD pain Skin: Denies abnormal skin rashes Lymphatics: Denies new lymphadenopathy, easy bruising, or bleeding Neurological:Denies numbness, tingling or new weaknesses Behavioral/Psych: Mood is stable, no new changes  MSK: stable joint aches to hands and knees bilaterally All other systems were reviewed with the patient and are negative.  MEDICAL HISTORY:  Past Medical History:  Diagnosis Date  . Blood transfusion without reported diagnosis   . Breast cancer [facility]) August 2002   Invasive ductal carcinoma Left breast. 2002, 2017  . Diabetes mellitus without complication ([place])   . Family history of malignant neoplasm of ovary   . Family history of pancreatic cancer   . Hypertension     SURGICAL HISTORY: Past Surgical History:  Procedure Laterality Date  . BREAST SURGERY Left 2003  . COLONOSCOPY    . FOOT SURGERY  2000  . MASTECTOMY Left 05/28/2016  . port a cath insertion  05/28/2016  . PORT-A-CATH REMOVAL Right 08/19/2017   Procedure: REMOVAL PORT-A-CATH;  Surgeon: [name], MD;  Location: [place];  Service: General;  Laterality: Right;  . PORTACATH PLACEMENT Right 05/28/2016   Procedure: INSERTION PORT-A-CATH;  Surgeon: [name], MD;  Location: [place];  Service: General;  Laterality: Right;  . TOTAL MASTECTOMY Left 05/28/2016   Procedure: LEFT  MASTECTOMY;  Surgeon:  [name], MD;  Location: [place];  Service: General;  Laterality: Left;  . TUBAL LIGATION  22 yrs since  2004.   Bilateral.    I have reviewed the social history and family history with the patient and they are unchanged from previous note.  ALLERGIES:  is allergic to codeine.  MEDICATIONS:  Current Outpatient Prescriptions  Medication Sig Dispense Refill  . aspirin 81 MG tablet Take 81 mg by mouth daily.     . diphenoxylate-atropine (LOMOTIL) 2.5-0.025 MG tablet Take 1 tablet by mouth 4 (four) times daily as needed for diarrhea or loose stools. 30 tablet 0  . glucose blood (TRUE METRIX BLOOD GLUCOSE TEST) test strip 1 each by Other route 3 (three) times daily. Use as instructed 600 each 2  . glyBURIDE-metformin (GLUCOVANCE) 2.5-500 MG tablet TAKE 2 TABLETS BY MOUTH 2 (TWO) TIMES DAILY WITH A  MEAL. 360 tablet 1  . ibuprofen (ADVIL,MOTRIN) 200 MG tablet Take 200 mg by mouth every 8 (eight) hours as needed.      Marland Kitchen letrozole (FEMARA) 2.5 MG tablet Take 1 tablet (2.5 mg total) by mouth daily. 30 tablet 3  . lisinopril-hydrochlorothiazide (PRINZIDE,ZESTORETIC) 20-25 MG tablet TAKE 1 TABLET BY MOUTH DAILY. 90 tablet 0  . Omega-3 Fatty Acids (FISH OIL) 1200 MG CAPS Take 1 capsule by mouth daily.    . ondansetron (ZOFRAN-ODT) 4 MG disintegrating tablet Take 1 tablet (4 mg total) by mouth every 8 (eight) hours as needed for nausea or vomiting. 20 tablet 1  . Potassium Chloride ER 20 MEQ TBCR Take 40 mEq by mouth daily. 60 tablet 1  . torsemide (DEMADEX) 20 MG tablet Take 20 mg by mouth daily.    . TRUEPLUS LANCETS 33G MISC 1 each by Does not apply route 3 (three) times daily. 600 each 2  . Neratinib Maleate (NERLYNX) 40 MG tablet Take 6 tablets (240 mg total) by mouth daily. Take with food. (Patient not taking: Reported on 09/21/2017) 180 tablet 1   No current facility-administered medications for this visit.    Facility-Administered Medications Ordered in Other Visits  Medication Dose Route  Frequency Provider Last Rate Last Dose  . sodium chloride 0.9 % injection 10 mL  10 mL Intravenous PRN [name], MD   10 mL at 02/16/17 1004  . sodium chloride flush (NS) 0.9 % injection 10 mL  10 mL Intracatheter PRN [name], MD   10 mL at 06/24/17 1309    PHYSICAL EXAMINATION: ECOG PERFORMANCE STATUS: 1 - Symptomatic but completely ambulatory  Vitals:   09/21/17 1121  BP: 135/64  Pulse: 93  Resp: 18  Temp: 98.3 F (36.8 C)  SpO2: 100%   Filed Weights   09/21/17 1121  Weight: 235 lb 3.2 oz (106.7 kg)    GENERAL:alert, no distress and comfortable SKIN: skin color, texture, turgor are normal, no rashes or significant lesions EYES: normal, Conjunctiva are pink and non-injected, sclera clear OROPHARYNX:no exudate, no erythema and lips, buccal mucosa, and tongue normal  NECK: supple, thyroid normal size, non-tender, without nodularity LYMPH:  no palpable cervical or supraclavicular lymphadenopathy  LUNGS: clear to auscultation bilaterally with normal breathing effort HEART: regular rate & rhythm, no murmurs and no lower extremity edema ABDOMEN:abdomen soft, non-tender and normal bowel sounds Musculoskeletal:no cyanosis of digits and no clubbing  NEURO: alert & oriented x 3 with fluent speech, no focal motor/sensory deficits  LABORATORY DATA:  I have reviewed the data as listed CBC Latest Ref Rng & Units 09/21/2017 09/14/2017 09/08/2017  WBC 3.9 - 10.3 10e3/uL 9.6 7.3 7.3  Hemoglobin 11.6 - 15.9 g/dL 9.4(L) 10.4(L) 10.6(L)  Hematocrit 34.8 - 46.6 % 28.1(L) 31.1(L) 31.6(L)  Platelets 145 - 400 10e3/uL 232 233 291     CMP Latest Ref Rng & Units 09/21/2017 09/14/2017 09/08/2017  Glucose 70 - 140 mg/dl 132 118 159(H)  BUN 7.0 - 26.0 mg/dL 19.7 35.2(H) 44.1(H)  Creatinine 0.6 - 1.1 mg/dL 1.1 1.4(H) 1.6(H)  Sodium 136 - 145 mEq/L 141 137 140  Potassium 3.5 - 5.1 mEq/L 4.1 5.1 4.7  Chloride 101 - 111 mmol/L - - -  CO2 22 - 29 mEq/L 25 20(L) 20(L)  Calcium 8.4 - 10.4 mg/dL  8.8 9.6 9.2  Total Protein 6.4 - 8.3 g/dL 7.3 8.0 7.9  Total Bilirubin 0.20 - 1.20 mg/dL 0.31 <0.22 0.26  Alkaline Phos 40 - 150 U/L 66 68  73  AST 5 - 34 U/L 21 27 27   ALT 0 - 55 U/L 23 27 28     RADIOGRAPHIC STUDIES: I have personally reviewed the radiological images as listed and agreed with the findings in the report. No results found.   ASSESSMENT & PLAN: 66 y.o.African-American female, postmenopausal, history of left breast triple negative cancer in 2003, presented with mammogram discovered left breast cancer, triple positive.  1. 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 Anemia  3. DM and HTN  4. Obesity  5. Peripheral neuropathy, G1 6. Fatigue  7. Bone Health 8. Diarrhea secondary to Neratinib  9. AKI  Ms. Burger appears improved today, her diarrhea has resolved and creatine returned to baseline, 1.1 today. VS, weight, and labs stable, physical exam unremarkable. She will restart Nerlynx after colonoscopy on 09/22/2017. She will continue daily letrozole.  Return in 2-3 weeks for lab and f/u with Dr. Burr Medico. She will be due to bone density in 2020; annual mammogram will be due 03/2018.   PLAN: -restart nerlynx 09/22/17 after colonoscopy at [place] -continue daily letrozole  -maximize imodium and lomotil for diarrhea, compazine for nausea -lab and f/u in 2-3 weeks   All questions were answered. The patient knows to call the clinic with any problems, questions or concerns. No barriers to learning was detected.     Alla Feeling, NP 09/21/17

## 2017-09-21 NOTE — Telephone Encounter (Signed)
Scheduled appt per 10/23 los - Gave patient AVS and calender per los.  

## 2017-09-22 ENCOUNTER — Encounter: Payer: Self-pay | Admitting: Gastroenterology

## 2017-09-22 ENCOUNTER — Ambulatory Visit (AMBULATORY_SURGERY_CENTER): Payer: Medicare PPO | Admitting: Gastroenterology

## 2017-09-22 VITALS — BP 104/59 | HR 90 | Temp 97.3°F | Resp 11 | Ht 67.0 in | Wt 232.0 lb

## 2017-09-22 DIAGNOSIS — D123 Benign neoplasm of transverse colon: Secondary | ICD-10-CM | POA: Diagnosis not present

## 2017-09-22 DIAGNOSIS — Z86010 Personal history of colonic polyps: Secondary | ICD-10-CM

## 2017-09-22 MED ORDER — SODIUM CHLORIDE 0.9 % IV SOLN: 500.0000 mL | INTRAVENOUS | Status: DC

## 2017-09-22 NOTE — Op Note (Signed)
[facility] Patient Name: Lindsey Marquez Procedure Date: 09/22/2017 10:14 AM MRN: [id number] Endoscopist: Mauri Pole , MD Age: 66 Referring MD:  Date of Birth: 08/15/1951 Gender: Female Account #: [id number] Procedure:                Colonoscopy Indications:              High risk colon cancer surveillance: Personal                            history of colonic polyps Medicines:                Monitored Anesthesia Care Procedure:                Pre-Anesthesia Assessment:                           - Prior to the procedure, a History and Physical                            was performed, and patient medications and                            allergies were reviewed. The patient's tolerance of                            previous anesthesia was also reviewed. The risks                            and benefits of the procedure and the sedation                            options and risks were discussed with the patient.                            All questions were answered, and informed consent                            was obtained. Prior Anticoagulants: The patient has                            taken no previous anticoagulant or antiplatelet                            agents. ASA Grade Assessment: II - A patient with                            mild systemic disease. After reviewing the risks                            and benefits, the patient was deemed in                            satisfactory condition to undergo the procedure.  After obtaining informed consent, the colonoscope                            was passed under direct vision. Throughout the                            procedure, the patient's blood pressure, pulse, and                            oxygen saturations were monitored continuously. The                            Model PCF-[id number] scope was introduced                            through the anus and advanced  to the the cecum,                            identified by appendiceal orifice and ileocecal                            valve. The colonoscopy was performed without                            difficulty. The patient tolerated the procedure                            well. The quality of the bowel preparation was                            adequate. The ileocecal valve, appendiceal orifice,                            and rectum were photographed. Scope In: 10:26:42 AM Scope Out: 10:45:17 AM Scope Withdrawal Time: 0 hours 12 minutes 5 seconds  Total Procedure Duration: 0 hours 18 minutes 35 seconds  Findings:                 A 3 mm polyp was found in the transverse colon. The                            polyp was sessile. The polyp was removed with a                            cold biopsy forceps. Resection and retrieval were                            complete.                           Non-bleeding internal hemorrhoids were found during                            retroflexion. The hemorrhoids were small. Complications:  No immediate complications. Estimated Blood Loss:     Estimated blood loss was minimal. Impression:               - One 3 mm polyp in the transverse colon, removed                            with a cold biopsy forceps. Resected and retrieved.                           - Non-bleeding internal hemorrhoids. Recommendation:           - Patient has a contact number available for                            emergencies. The signs and symptoms of potential                            delayed complications were discussed with the                            patient. Return to normal activities tomorrow.                            Written discharge instructions were provided to the                            patient.                           - Resume previous diet.                           - Continue present medications.                           - Await pathology  results.                           - Repeat colonoscopy in 5 years for surveillance                            based on pathology results. Mauri Pole, MD 09/22/2017 10:51:27 AM This report has been signed electronically.

## 2017-09-22 NOTE — Progress Notes (Signed)
Called to room to assist during endoscopic procedure.  Patient ID and intended procedure confirmed with present staff. Received instructions for my participation in the procedure from the performing physician.  

## 2017-09-22 NOTE — Progress Notes (Signed)
To recovery, report to RN, VSS. 

## 2017-09-22 NOTE — Patient Instructions (Signed)
Handout given: Polyps and Hemorrhoids.    YOU HAD AN ENDOSCOPIC PROCEDURE TODAY AT [facility]:   Refer to the procedure report that was given to you for any specific questions about what was found during the examination.  If the procedure report does not answer your questions, please call your gastroenterologist to clarify.  If you requested that your care partner not be given the details of your procedure findings, then the procedure report has been included in a sealed envelope for you to review at your convenience later.  YOU SHOULD EXPECT: Some feelings of bloating in the abdomen. Passage of more gas than usual.  Walking can help get rid of the air that was put into your GI tract during the procedure and reduce the bloating. If you had a lower endoscopy (such as a colonoscopy or flexible sigmoidoscopy) you may notice spotting of blood in your stool or on the toilet paper. If you underwent a bowel prep for your procedure, you may not have a normal bowel movement for a few days.  Please Note:  You might notice some irritation and congestion in your nose or some drainage.  This is from the oxygen used during your procedure.  There is no need for concern and it should clear up in a day or so.  SYMPTOMS TO REPORT IMMEDIATELY:   Following lower endoscopy (colonoscopy or flexible sigmoidoscopy):  Excessive amounts of blood in the stool  Significant tenderness or worsening of abdominal pains  Swelling of the abdomen that is new, acute  Fever of 100F or higher   For urgent or emergent issues, a gastroenterologist can be reached at any hour by calling [phone].   DIET:  We do recommend a small meal at first, but then you may proceed to your regular diet.  Drink plenty of fluids but you should avoid alcoholic beverages for 24 hours.  ACTIVITY:  You should plan to take it easy for the rest of today and you should NOT DRIVE or use heavy machinery until tomorrow (because of  the sedation medicines used during the test).    FOLLOW UP: Our staff will call the number listed on your records the next business day following your procedure to check on you and address any questions or concerns that you may have regarding the information given to you following your procedure. If we do not reach you, we will leave a message.  However, if you are feeling well and you are not experiencing any problems, there is no need to return our call.  We will assume that you have returned to your regular daily activities without incident.  If any biopsies were taken you will be contacted by phone or by letter within the next 1-3 weeks.  Please call us at [phone] if you have not heard about the biopsies in 3 weeks.    SIGNATURES/CONFIDENTIALITY: You and/or your care partner have signed paperwork which will be entered into your electronic medical record.  These signatures attest to the fact that that the information above on your After Visit Summary has been reviewed and is understood.  Full responsibility of the confidentiality of this discharge information lies with you and/or your care-partner.

## 2017-09-23 ENCOUNTER — Telehealth: Payer: Self-pay | Admitting: *Deleted

## 2017-09-23 NOTE — Telephone Encounter (Signed)
  Follow up Call-  Call back number 09/22/2017  Post procedure Call Back phone  # [phone]  Permission to leave phone message Yes  Some recent data might be hidden     Patient questions:  Do you have a fever, pain , or abdominal swelling? No. Pain Score  0 *  Have you tolerated food without any problems? Yes.    Have you been able to return to your normal activities? Yes.    Do you have any questions about your discharge instructions: Diet   No. Medications  No. Follow up visit  No.  Do you have questions or concerns about your Care? No.  Actions: * If pain score is 4 or above: No action needed, pain <4.

## 2017-10-04 ENCOUNTER — Ambulatory Visit (INDEPENDENT_AMBULATORY_CARE_PROVIDER_SITE_OTHER): Payer: Medicare PPO | Admitting: Internal Medicine

## 2017-10-04 ENCOUNTER — Encounter: Payer: Self-pay | Admitting: Internal Medicine

## 2017-10-04 VITALS — BP 124/66 | HR 90 | Temp 98.1°F | Ht 67.0 in | Wt 230.0 lb

## 2017-10-04 DIAGNOSIS — Z0000 Encounter for general adult medical examination without abnormal findings: Secondary | ICD-10-CM

## 2017-10-04 DIAGNOSIS — Z23 Encounter for immunization: Secondary | ICD-10-CM

## 2017-10-04 DIAGNOSIS — R252 Cramp and spasm: Secondary | ICD-10-CM

## 2017-10-04 DIAGNOSIS — Z1159 Encounter for screening for other viral diseases: Secondary | ICD-10-CM

## 2017-10-04 DIAGNOSIS — C50912 Malignant neoplasm of unspecified site of left female breast: Secondary | ICD-10-CM | POA: Diagnosis not present

## 2017-10-04 DIAGNOSIS — E119 Type 2 diabetes mellitus without complications: Secondary | ICD-10-CM

## 2017-10-04 DIAGNOSIS — R600 Localized edema: Secondary | ICD-10-CM | POA: Diagnosis not present

## 2017-10-04 DIAGNOSIS — R609 Edema, unspecified: Secondary | ICD-10-CM | POA: Insufficient documentation

## 2017-10-04 DIAGNOSIS — I10 Essential (primary) hypertension: Secondary | ICD-10-CM

## 2017-10-04 LAB — COMPREHENSIVE METABOLIC PANEL
ALT: 28 U/L (ref 0–35)
AST: 32 U/L (ref 0–37)
Albumin: 4.2 g/dL (ref 3.5–5.2)
Alkaline Phosphatase: 66 U/L (ref 39–117)
BUN: 20 mg/dL (ref 6–23)
CO2: 23 mEq/L (ref 19–32)
Calcium: 9.7 mg/dL (ref 8.4–10.5)
Chloride: 106 mEq/L (ref 96–112)
Creatinine, Ser: 1.07 mg/dL (ref 0.40–1.20)
GFR: 65.91 mL/min (ref >60.00)
Glucose, Bld: 146 mg/dL — ABNORMAL HIGH (ref 70–99)
Potassium: 3.6 mEq/L (ref 3.5–5.1)
Sodium: 138 mEq/L (ref 135–145)
Total Bilirubin: 0.3 mg/dL (ref 0.2–1.2)
Total Protein: 7.6 g/dL (ref 6.0–8.3)

## 2017-10-04 LAB — CBC
HCT: 29.7 % — ABNORMAL LOW (ref 36.0–46.0)
Hemoglobin: 10 g/dL — ABNORMAL LOW (ref 12.0–15.0)
MCHC: 33.7 g/dL (ref 30.0–36.0)
MCV: 102.5 fl — ABNORMAL HIGH (ref 78.0–100.0)
Platelets: 361 10*3/uL (ref 150.0–400.0)
RBC: 2.9 Mil/uL — ABNORMAL LOW (ref 3.87–5.11)
RDW: 13.7 % (ref 11.5–15.5)
WBC: 7.5 10*3/uL (ref 4.0–10.5)

## 2017-10-04 LAB — LIPID PANEL
Cholesterol: 160 mg/dL (ref 0–200)
HDL: 42.5 mg/dL (ref >39.00)
NonHDL: 117.72
Total CHOL/HDL Ratio: 4
Triglycerides: 203 mg/dL — ABNORMAL HIGH (ref 0.0–149.0)
VLDL: 40.6 mg/dL — ABNORMAL HIGH (ref 0.0–40.0)

## 2017-10-04 LAB — HEMOGLOBIN A1C: Hgb A1c MFr Bld: 8 % — ABNORMAL HIGH (ref 4.6–6.5)

## 2017-10-04 LAB — VITAMIN D 25 HYDROXY (VIT D DEFICIENCY, FRACTURES): VITD: 15.31 ng/mL — ABNORMAL LOW (ref 30.00–100.00)

## 2017-10-04 LAB — LDL CHOLESTEROL, DIRECT: Direct LDL: 95 mg/dL

## 2017-10-04 NOTE — Assessment & Plan Note (Signed)
A1C today No microalbumin secondary to ACEI therapy Encouraged her to consume a balanced diet and exercise regimen Advised her to see an eye doctor and dentist annually Foot exam today Continue yearly eye exams Prevnar today Continue Glyburide-Metformin for now

## 2017-10-04 NOTE — Assessment & Plan Note (Signed)
Continue Torsemide and Potassium CMET today Will refill if appropriate based on labs

## 2017-10-04 NOTE — Assessment & Plan Note (Signed)
Controlled on Lisinopril HCT CMET today Discussed DASH diet and exercise for weight loss Will monitor

## 2017-10-04 NOTE — Addendum Note (Signed)
Addended by: Lurlean Nanny on: 10/04/2017 04:57 PM   Modules accepted: Orders

## 2017-10-04 NOTE — Patient Instructions (Signed)
Health Maintenance for Postmenopausal Women Menopause is a normal process in which your reproductive ability comes to an end. This process happens gradually over a span of months to years, usually between the ages of 22 and 9. Menopause is complete when you have missed 12 consecutive menstrual periods. It is important to talk with your health care provider about some of the most common conditions that affect postmenopausal women, such as heart disease, cancer, and bone loss (osteoporosis). Adopting a healthy lifestyle and getting preventive care can help to promote your health and wellness. Those actions can also lower your chances of developing some of these common conditions. What should I know about menopause? During menopause, you may experience a number of symptoms, such as:  Moderate-to-severe hot flashes.  Night sweats.  Decrease in sex drive.  Mood swings.  Headaches.  Tiredness.  Irritability.  Memory problems.  Insomnia.  Choosing to treat or not to treat menopausal changes is an individual decision that you make with your health care provider. What should I know about hormone replacement therapy and supplements? Hormone therapy products are effective for treating symptoms that are associated with menopause, such as hot flashes and night sweats. Hormone replacement carries certain risks, especially as you become older. If you are thinking about using estrogen or estrogen with progestin treatments, discuss the benefits and risks with your health care provider. What should I know about heart disease and stroke? Heart disease, heart attack, and stroke become more likely as you age. This may be due, in part, to the hormonal changes that your body experiences during menopause. These can affect how your body processes dietary fats, triglycerides, and cholesterol. Heart attack and stroke are both medical emergencies. There are many things that you can do to help prevent heart disease  and stroke:  Have your blood pressure checked at least every 1-2 years. High blood pressure causes heart disease and increases the risk of stroke.  If you are 53-22 years old, ask your health care provider if you should take aspirin to prevent a heart attack or a stroke.  Do not use any tobacco products, including cigarettes, chewing tobacco, or electronic cigarettes. If you need help quitting, ask your health care provider.  It is important to eat a healthy diet and maintain a healthy weight. ? Be sure to include plenty of vegetables, fruits, low-fat dairy products, and lean protein. ? Avoid eating foods that are high in solid fats, added sugars, or salt (sodium).  Get regular exercise. This is one of the most important things that you can do for your health. ? Try to exercise for at least 150 minutes each week. The type of exercise that you do should increase your heart rate and make you sweat. This is known as moderate-intensity exercise. ? Try to do strengthening exercises at least twice each week. Do these in addition to the moderate-intensity exercise.  Know your numbers.Ask your health care provider to check your cholesterol and your blood glucose. Continue to have your blood tested as directed by your health care provider.  What should I know about cancer screening? There are several types of cancer. Take the following steps to reduce your risk and to catch any cancer development as early as possible. Breast Cancer  Practice breast self-awareness. ? This means understanding how your breasts normally appear and feel. ? It also means doing regular breast self-exams. Let your health care provider know about any changes, no matter how small.  If you are 40  or older, have a clinician do a breast exam (clinical breast exam or CBE) every year. Depending on your age, family history, and medical history, it may be recommended that you also have a yearly breast X-ray (mammogram).  If you  have a family history of breast cancer, talk with your health care provider about genetic screening.  If you are at high risk for breast cancer, talk with your health care provider about having an MRI and a mammogram every year.  Breast cancer (BRCA) gene test is recommended for women who have family members with BRCA-related cancers. Results of the assessment will determine the need for genetic counseling and BRCA1 and for BRCA2 testing. BRCA-related cancers include these types: ? Breast. This occurs in males or females. ? Ovarian. ? Tubal. This may also be called fallopian tube cancer. ? Cancer of the abdominal or pelvic lining (peritoneal cancer). ? Prostate. ? Pancreatic.  Cervical, Uterine, and Ovarian Cancer Your health care provider may recommend that you be screened regularly for cancer of the pelvic organs. These include your ovaries, uterus, and vagina. This screening involves a pelvic exam, which includes checking for microscopic changes to the surface of your cervix (Pap test).  For women ages 21-65, health care providers may recommend a pelvic exam and a Pap test every three years. For women ages 79-65, they may recommend the Pap test and pelvic exam, combined with testing for human papilloma virus (HPV), every five years. Some types of HPV increase your risk of cervical cancer. Testing for HPV may also be done on women of any age who have unclear Pap test results.  Other health care providers may not recommend any screening for nonpregnant women who are considered low risk for pelvic cancer and have no symptoms. Ask your health care provider if a screening pelvic exam is right for you.  If you have had past treatment for cervical cancer or a condition that could lead to cancer, you need Pap tests and screening for cancer for at least 20 years after your treatment. If Pap tests have been discontinued for you, your risk factors (such as having a new sexual partner) need to be  reassessed to determine if you should start having screenings again. Some women have medical problems that increase the chance of getting cervical cancer. In these cases, your health care provider may recommend that you have screening and Pap tests more often.  If you have a family history of uterine cancer or ovarian cancer, talk with your health care provider about genetic screening.  If you have vaginal bleeding after reaching menopause, tell your health care provider.  There are currently no reliable tests available to screen for ovarian cancer.  Lung Cancer Lung cancer screening is recommended for adults 69-62 years old who are at high risk for lung cancer because of a history of smoking. A yearly low-dose CT scan of the lungs is recommended if you:  Currently smoke.  Have a history of at least 30 pack-years of smoking and you currently smoke or have quit within the past 15 years. A pack-year is smoking an average of one pack of cigarettes per day for one year.  Yearly screening should:  Continue until it has been 15 years since you quit.  Stop if you develop a health problem that would prevent you from having lung cancer treatment.  Colorectal Cancer  This type of cancer can be detected and can often be prevented.  Routine colorectal cancer screening usually begins at  age 42 and continues through age 45.  If you have risk factors for colon cancer, your health care provider may recommend that you be screened at an earlier age.  If you have a family history of colorectal cancer, talk with your health care provider about genetic screening.  Your health care provider may also recommend using home test kits to check for hidden blood in your stool.  A small camera at the end of a tube can be used to examine your colon directly (sigmoidoscopy or colonoscopy). This is done to check for the earliest forms of colorectal cancer.  Direct examination of the colon should be repeated every  5-10 years until age 71. However, if early forms of precancerous polyps or small growths are found or if you have a family history or genetic risk for colorectal cancer, you may need to be screened more often.  Skin Cancer  Check your skin from head to toe regularly.  Monitor any moles. Be sure to tell your health care provider: ? About any new moles or changes in moles, especially if there is a change in a mole's shape or color. ? If you have a mole that is larger than the size of a pencil eraser.  If any of your family members has a history of skin cancer, especially at a young age, talk with your health care provider about genetic screening.  Always use sunscreen. Apply sunscreen liberally and repeatedly throughout the day.  Whenever you are outside, protect yourself by wearing long sleeves, pants, a wide-brimmed hat, and sunglasses.  What should I know about osteoporosis? Osteoporosis is a condition in which bone destruction happens more quickly than new bone creation. After menopause, you may be at an increased risk for osteoporosis. To help prevent osteoporosis or the bone fractures that can happen because of osteoporosis, the following is recommended:  If you are 46-71 years old, get at least 1,000 mg of calcium and at least 600 mg of vitamin D per day.  If you are older than age 55 but younger than age 65, get at least 1,200 mg of calcium and at least 600 mg of vitamin D per day.  If you are older than age 54, get at least 1,200 mg of calcium and at least 800 mg of vitamin D per day.  Smoking and excessive alcohol intake increase the risk of osteoporosis. Eat foods that are rich in calcium and vitamin D, and do weight-bearing exercises several times each week as directed by your health care provider. What should I know about how menopause affects my mental health? Depression may occur at any age, but it is more common as you become older. Common symptoms of depression  include:  Low or sad mood.  Changes in sleep patterns.  Changes in appetite or eating patterns.  Feeling an overall lack of motivation or enjoyment of activities that you previously enjoyed.  Frequent crying spells.  Talk with your health care provider if you think that you are experiencing depression. What should I know about immunizations? It is important that you get and maintain your immunizations. These include:  Tetanus, diphtheria, and pertussis (Tdap) booster vaccine.  Influenza every year before the flu season begins.  Pneumonia vaccine.  Shingles vaccine.  Your health care provider may also recommend other immunizations. This information is not intended to replace advice given to you by your health care provider. Make sure you discuss any questions you have with your health care provider. Document Released: 01/08/2006  Document Revised: 06/05/2016 Document Reviewed: 08/20/2015 [employer] Interactive Patient Education  2018 [employer]  

## 2017-10-04 NOTE — Progress Notes (Signed)
HPI:  Pt presents to the clinic today for her Medicare Wellness Exam.  History of Breast Cancer: She is cancer free. She is taking the Femara and Nerlynx as prescribed. She reports the Nerlynx causes her diarrhea, for which she takes Imodium. She follows with Dr. Burr Medico, note from 08/2017 reviewed.  DM 2: Her last A1C was6.9 %, 11/2016. She is taking Glyburide-Metformin as prescribed. Her sugars range 89-100. She checks her feet daily. Her last eye exam was 07/2017.  HTN: Her BP today is 124/66. She is taking Lisinopril HCT as prescribed. ECG from 07/2017 reviewed.  Edema: She takes Torsemide and Potassium daily. She is requesting a refill of the Torsemide and Potassium today.  Past Medical History:  Diagnosis Date  . Blood transfusion without reported diagnosis   . Breast cancer [facility]) August 2002   Invasive ductal carcinoma Left breast. 2002, 2017  . Diabetes mellitus without complication ([place])   . Family history of malignant neoplasm of ovary   . Family history of pancreatic cancer   . Hypertension     Current Outpatient Medications  Medication Sig Dispense Refill  . aspirin 81 MG tablet Take 81 mg by mouth daily.     . diphenoxylate-atropine (LOMOTIL) 2.5-0.025 MG tablet Take 1 tablet by mouth 4 (four) times daily as needed for diarrhea or loose stools. 30 tablet 0  . glucose blood (TRUE METRIX BLOOD GLUCOSE TEST) test strip 1 each by Other route 3 (three) times daily. Use as instructed 600 each 2  . glyBURIDE-metformin (GLUCOVANCE) 2.5-500 MG tablet TAKE 2 TABLETS BY MOUTH 2 (TWO) TIMES DAILY WITH A MEAL. 360 tablet 1  . ibuprofen (ADVIL,MOTRIN) 200 MG tablet Take 200 mg by mouth every 8 (eight) hours as needed.      Marland Kitchen letrozole (FEMARA) 2.5 MG tablet Take 1 tablet (2.5 mg total) by mouth daily. 30 tablet 3  . lisinopril-hydrochlorothiazide (PRINZIDE,ZESTORETIC) 20-25 MG tablet TAKE 1 TABLET BY MOUTH DAILY. 90 tablet 0  . NERLYNX 40 MG tablet TAKE 6 TABLETS (240MG ) BY MOUTH ONE TIME  DAILY WITH FOOD. 180 tablet 1  . Omega-3 Fatty Acids (FISH OIL) 1200 MG CAPS Take 1 capsule by mouth daily.    . ondansetron (ZOFRAN-ODT) 4 MG disintegrating tablet Take 1 tablet (4 mg total) by mouth every 8 (eight) hours as needed for nausea or vomiting. 20 tablet 1  . Potassium Chloride ER 20 MEQ TBCR Take 40 mEq by mouth daily. 60 tablet 1  . torsemide (DEMADEX) 20 MG tablet Take 20 mg by mouth daily.    . TRUEPLUS LANCETS 33G MISC 1 each by Does not apply route 3 (three) times daily. 600 each 2   No current facility-administered medications for this visit.    Facility-Administered Medications Ordered in Other Visits  Medication Dose Route Frequency Provider Last Rate Last Dose  . sodium chloride 0.9 % injection 10 mL  10 mL Intravenous PRN [name], MD   10 mL at 02/16/17 1004  . sodium chloride flush (NS) 0.9 % injection 10 mL  10 mL Intracatheter PRN [name], MD   10 mL at 06/24/17 1309    Allergies  Allergen Reactions  . Codeine     "get crazy", "see things that aren't there"    Family History  Problem Relation Age of Onset  . Prostate cancer Father   . Ovarian cancer Maternal Aunt 22       deceased  . Pancreatic cancer Mother 82       deceased  .  Cancer Maternal Aunt        2 other mat aunts; unk. primary in 28s; deceased  . Cancer Maternal Uncle        "bone ca"; unk. primary; deceased 39s  . Prostate cancer Maternal Uncle        deceased 7  . Colon cancer Neg Hx   . Esophageal cancer Neg Hx   . Rectal cancer Neg Hx   . Stomach cancer Neg Hx     Social History   Socioeconomic History  . Marital status: Divorced    Spouse name: Not on file  . Number of children: Not on file  . Years of education: Not on file  . Highest education level: Not on file  Social Needs  . Financial resource strain: Not on file  . Food insecurity - worry: Not on file  . Food insecurity - inability: Not on file  . Transportation needs - medical: Not on file  . Transportation  needs - non-medical: Not on file  Occupational History  . Not on file  Tobacco Use  . Smoking status: Former Smoker    Packs/day: 0.10    Years: 7.00    Pack years: 0.70    Last attempt to quit: 12/01/1975    Years since quitting: 41.8  . Smokeless tobacco: Never Used  Substance and Sexual Activity  . Alcohol use: No    Alcohol/week: 0.0 oz  . Drug use: No  . Sexual activity: Yes  Other Topics Concern  . Not on file  Social History Narrative  . Not on file    Hospitiliaztions: None  Health Maintenance:    Flu: 08/2017  Tetanus: 12/2013  Pneumovax: 01/2016  Prevnar: never  Zostavax: 08/2012  Mammogram: 03/2017, GI Breast  Pap Smear: 01/2016  Bone Density: 03/2017  Colon Screening: 08/2017  Eye Doctor: 07/2017, the Rew Exam: as needed   Providers:   PCP: Webb Silversmith, NP-C  Oncology: Dr. Burr Medico  Cardiologist: Dr. Haroldine Laws  Gastroenterologist: Dr. Silverio Decamp   I have personally reviewed and have noted:  1. The patient's medical and social history 2. Their use of alcohol, tobacco or illicit drugs 3. Their current medications and supplements 4. The patient's functional ability including ADL's, fall risks, home safety risks and hearing or visual impairment. 5. Diet and physical activities 6. Evidence for depression or mood disorder  Subjective:   Review of Systems:   Constitutional: Denies fever, malaise, fatigue, headache or abrupt weight changes.  HEENT: Denies eye pain, eye redness, ear pain, ringing in the ears, wax buildup, runny nose, nasal congestion, bloody nose, or sore throat. Respiratory: Denies difficulty breathing, shortness of breath, cough or sputum production.   Cardiovascular: Denies chest pain, chest tightness, palpitations or swelling in the hands or feet.  Gastrointestinal: Pt reports mass of abdominal wall. Denies abdominal pain, bloating, constipation, diarrhea or blood in the stool.  GU: Denies urgency, frequency, pain with  urination, burning sensation, blood in urine, odor or discharge. Musculoskeletal: Pt reports lower leg cramps at night. Denies decrease in range of motion, difficulty with gait, or joint pain and swelling.  Skin: Denies redness, rashes, lesions or ulcercations.  Neurological: Denies dizziness, difficulty with memory, difficulty with speech or problems with balance and coordination.  Psych: Denies anxiety, depression, SI/HI.  No other specific complaints in a complete review of systems (except as listed in HPI above).  Objective:  PE:   BP 124/66   Pulse 90   Temp 98.1 F (  36.7 C) (Oral)   Ht 5\' 7"  (1.702 m)   Wt 230 lb (104.3 kg)   SpO2 97%   BMI 36.02 kg/m   Wt Readings from Last 3 Encounters:  10/04/17 230 lb (104.3 kg)  09/22/17 232 lb (105.2 kg)  09/21/17 235 lb 3.2 oz (106.7 kg)    General: Appears her stated age, obese in NAD. Skin: Warm, dry and intact.  HEENT: Head: normal shape and size; Eyes: sclera white, no icterus, conjunctiva pink, PERRLA and EOMs intact; Ears: Tm's gray and intact, normal light reflex; Throat/Mouth: Teeth present, mucosa pink and moist, no exudate, lesions or ulcerations noted.  Neck: Neck supple, trachea midline. No masses, lumps or thyromegaly present.  Cardiovascular: Normal rate and rhythm. S1,S2 noted.  No murmur, rubs or gallops noted. No JVD or BLE edema. No carotid bruits noted. Pulmonary/Chest: Normal effort and positive vesicular breath sounds. No respiratory distress. No wheezes, rales or ronchi noted.  Abdomen: Soft and nontender. Normal bowel sounds. Ventral hernia noted. Liver, spleen and kidneys non palpable. Musculoskeletal:  Strength 5/5 BUE/BLE. No signs of joint swelling.  Neurological: Alert and oriented. Cranial nerves II-XII grossly intact. Coordination normal.  Psychiatric: Mood and affect normal. Behavior is normal. Judgment and thought content normal.     BMET    Component Value Date/Time   NA 141 09/21/2017 1108    K 4.1 09/21/2017 1108   CL 104 08/16/2017 1504   CL 104 12/12/2012 1031   CO2 25 09/21/2017 1108   GLUCOSE 132 09/21/2017 1108   GLUCOSE 130 (H) 12/12/2012 1031   BUN 19.7 09/21/2017 1108   CREATININE 1.1 09/21/2017 1108   CALCIUM 8.8 09/21/2017 1108   GFRNONAA 42 (L) 08/16/2017 1504   GFRAA 48 (L) 08/16/2017 1504    Lipid Panel     Component Value Date/Time   CHOL 194 09/24/2016 0902   TRIG 120.0 09/24/2016 0902   HDL 79.10 09/24/2016 0902   CHOLHDL 2 09/24/2016 0902   VLDL 24.0 09/24/2016 0902   LDLCALC 91 09/24/2016 0902    CBC    Component Value Date/Time   WBC 9.6 09/21/2017 1108   WBC 9.6 09/24/2016 0902   RBC 2.82 (L) 09/21/2017 1108   RBC 2.43 (L) 09/24/2016 0902   HGB 9.4 (L) 09/21/2017 1108   HCT 28.1 (L) 09/21/2017 1108   PLT 232 09/21/2017 1108   MCV 99.6 09/21/2017 1108   MCH 33.3 09/21/2017 1108   MCH 32.0 05/29/2016 0532   MCHC 33.5 09/21/2017 1108   MCHC 33.9 09/24/2016 0902   RDW 13.1 09/21/2017 1108   LYMPHSABS 3.4 (H) 09/21/2017 1108   MONOABS 0.7 09/21/2017 1108   EOSABS 0.3 09/21/2017 1108   BASOSABS 0.0 09/21/2017 1108    Hgb A1C Lab Results  Component Value Date   HGBA1C 6.9 (H) 12/28/2016      Assessment and Plan:   Medicare Annual Wellness Visit:  Diet: She does eat lean meats. She consumes fruits and veggies. She tries to avoid fried foods. She drinks mostly water. Physical activity: Sedentary Depression/mood screen: Negative Hearing: Intact to whispered voice Visual acuity: Grossly normal ADLs: Capable Fall risk: None Home safety: Good Cognitive evaluation: Intact to orientation, naming, recall and repetition EOL planning: No adv directives, full code/ I agree  Preventative Medicine: Flu, tetanus, pneumovax, zostovax UTD. Prevnar today. Pap smear, mammogram, bone density and colon screening UTD. Encouraged her to consume a balanced diet and exercise regimen. Advised her to see an eye doctor and dentist annually.  Will  check CBC, CMET ,Lipid, A1C, Hep Cand Vit D today.  Muscle Cramps:  CMET today If Potassium normal, consider Neurontin 100 mg QHS  Next appointment: 1 year, Medicare Wellness Exam   Webb Silversmith, NP

## 2017-10-04 NOTE — Assessment & Plan Note (Signed)
In remission She will continue meds per oncology She will continue to follow with Dr. Burr Medico

## 2017-10-05 LAB — HEPATITIS C ANTIBODY
Hepatitis C Ab: NONREACTIVE
SIGNAL TO CUT-OFF: 0.01 (ref <1.00)

## 2017-10-06 ENCOUNTER — Encounter: Payer: Self-pay | Admitting: Gastroenterology

## 2017-10-08 NOTE — Progress Notes (Signed)
[place]  [phone] [phone]  Clinic Follow Up Note   Patient Care Team: [name], NP as PCP - General (Internal Medicine) [name], MD as Consulting Physician (General Surgery) 10/11/2017   CHIEF COMPLAINTS:  Follow up recurrent left breast cancer   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 [facility])   Staging form: Breast, AJCC 7th Edition     Clinical stage from 04/17/2016: Stage IA (T1c, N0, M0) - Signed by [name], MD on 05/07/2016     Pathologic stage from 05/28/2016: Stage IIA (T2, N0, cM0) - Signed by [name], MD on 06/11/2016 History of left breast cancer   Staging form: Breast, AJCC 7th Edition     Clinical: Stage IIIB (T4d, N1, cM0) - Signed by [name], MD on 12/14/2013     Pathologic: Stage IIIB (T4d, N1a, cM0) - Signed by [name], MD on 12/14/2013       History of left breast cancer   09/12/2002 Imaging    CT scan show no evidence of disease apart from the left axilla      09/2002 -  Neo-Adjuvant Chemotherapy    TAC X4 cycles       01/10/2003 Surgery    The patient had left lumpectomy and axillary lymph node dissection which show residual breast cancer and a sentinel lymph node was positive      2004 -  Adjuvant Chemotherapy    Cytoxan with 5-FU x4 cycles (methotrexate added at cycle 4).      2004 -  Radiation Therapy    Adjuvant left breast radiation      12/2002 Receptors her2    ER, PR and HER-2 were all negative.       Cancer of central portion of left breast ([place])   04/13/2016 Mammogram    Scheduler coarse heterogeneous calcifications spanning an area of 3.7 cm in the lumpectomy site, indeterminate. Ultrasound of the left axilla was negative.      04/17/2016 Initial Diagnosis    Cancer of central portion of left breast ([place])      04/17/2016 Initial Biopsy    Left breast core needle biopsy showed invasive and in situ ductal carcinoma with calcification, grade 2.      04/17/2016 Receptors her2    Your 100% positive, PR 15% positive, strong staining, Ki-67 10%, HER-2 positive by IHC (3)      05/05/2016 Imaging    Bilateral breast MRI showed a 1.3 x 0.8 x 0.7 cm biopsy-proven ductal carcinoma in the region of the previous lumpectomy. Enlarged lobulated right inferior axillary lymph node with diffuse cortical thickening, biopsy is recommended.      05/15/2016 Pathology Results    right axilary node biopsy was negative for malignant cell       05/28/2016 Surgery    Simple left mastectomy      05/28/2016 Pathology Results    Left mastectomy showed invasive ductal carcinoma, grade 3, 3.6 cm, high grade DCIS, (+) LVI, surgical margins were negative. Tumor 3.6 cm, no lymph nodes identified.      06/25/2016 Imaging    Staging CT scan of the chest, abdomen and pelvis with contrast and a bone scan showed no evidence of metastasis. Postoperative fluid collection in the left breast, likely a seroma or hematoma. Right axillary adenopathy, which was biopsied previously.       07/07/2016 - 07/15/2017 Chemotherapy    Adjuvant chemotherapy with docetaxel, carboplatin, Herceptin and pejeta  every 3 weeks, for 6 cycles, followed by Herceptin maintenance therapy for total of one year treatment  Ended [facility] 11/04/16        12/15/2016 -  Anti-estrogen oral therapy    Adjuvant letrozole 2.5 mg once daily      04/29/2017 Mammogram     Mammogram 04/29/2017 IMPRESSION: No mammographic evidence of malignancy. A result letter of this screening mammogram will be mailed directly to the patient.       09/22/2017 Procedure    Colonoscopy by Dr. Silverio Decamp  IMPRESSION: - One 3 mm polyp in the transverse colon, removed with a cold biopsy forceps. Resected and retrieved. - Non-bleeding internal hemorrhoids.      09/22/2017 Pathology Results    Diagnosis, colonoscopy  Surgical [P], transverse, polyp - TUBULAR ADENOMA(S). - HIGH GRADE DYSPLASIA IS NOT IDENTIFIED.           HISTORY OF PRESENTING ILLNESS: 05/08/16 Lindsey Marquez 66 y.o. female is here because of her recurrent left breast cancer.  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 She was seen by multiple medical oncologist in our practice in the past, last was seen by Dr. Alvy Bimler in 11/2013.  She has been doing well, and is compliant with annual screening mammogram. The screening mammogram on 04/17/2016 showed calcification in the left breast, and core needle biopsy showed invasive and in situ ductal carcinoma, grade 2, ER/PR positive, HER-2 amplified. She was referred to seeing breast surgeon Dr. Georgette Dover and referred back to [place].   She feels well, she did not feel any lump in her breast or axillas latley. She denies any pain, dyspnea, or GI symptoms. She had right nipple rash and right axillary swelling in the past one week, after she drinks some diet tea with citric. Breast MRI showed an enlarged lobulated right inferior axillary lymph node with diffuse cortical thickening, in addition to the known left breast mass which measured 1.3 cm on MRI.  GYN HISTORY  Menarchal: 12 LMP: 2003 Contraceptive: 20 HRT: n/a  G2P2: 66 yo Son and 70 yo son who died   CURRENT THERAPY:  Letrozole 2.5 mg daily, started on 12/15/2016, Nerlynx to start 08/26/17 approximately, held on 09/14/17 due to significant side effects, restarted on week of 09/21/17   INTERIM HISTORY:  [name] returns for follow up. She presents to the clinic noting she still has diarrhea. She started back taking Nerlynx on week of 16/10/96 and will skip certain days. She will have 2-3 loose BM with 6 Imodium daily when taking Nerlynx. She takes Lomotil as needed. She is drinking plenty of water. She denies any other issues with Nerlynx.  Her PCP said her Vitamin D was very low and she will receive a prescription for it.   She is taking letrozole and she has joint pain and tightness especially in her legs. She does take ibuprofen to help.      MEDICAL HISTORY:  Past Medical History:  Diagnosis Date  . Blood transfusion without reported diagnosis   . Breast cancer [facility]) August 2002   Invasive ductal carcinoma Left breast. 2002, 2017  . Diabetes mellitus without complication ([place])   . Family history of malignant neoplasm of ovary   . Family history of pancreatic cancer   . Hypertension     SURGICAL HISTORY: Past Surgical History:  Procedure Laterality Date  . BREAST SURGERY Left 2003  . COLONOSCOPY    .  FOOT SURGERY  2000  . MASTECTOMY Left 05/28/2016  . port a cath insertion  05/28/2016  . TUBAL LIGATION  22 yrs since  2004.   Bilateral.    SOCIAL HISTORY: Social History   Socioeconomic History  . Marital status: Divorced    Spouse name: Not on file  . Number of children: Not on file  . Years of education: Not on file  . Highest education level: Not on file  Social Needs  . Financial resource strain: Not on file  . Food insecurity - worry: Not on file  . Food insecurity - inability: Not on file  . Transportation needs - medical: Not on file  . Transportation needs - non-medical: Not on file  Occupational History  . Not on file  Tobacco Use  . Smoking status: Former Smoker    Packs/day: 0.10    Years: 7.00    Pack years: 0.70    Last attempt to quit: 12/01/1975    Years since quitting: 41.8  . Smokeless tobacco: Never Used  Substance and Sexual Activity  . Alcohol use: No    Alcohol/week: 0.0 oz  . Drug use: No  . Sexual activity: Yes  Other Topics Concern  . Not on file  Social History Narrative  . Not on file   She is retired from school   FAMILY HISTORY: Family History  Problem Relation Age of Onset  . Prostate cancer Father   . Ovarian cancer Maternal Aunt 73       deceased  . Pancreatic cancer Mother 7       deceased  . Cancer Maternal Aunt        2 other  mat aunts; unk. primary in 69s; deceased  . Cancer Maternal Uncle        "bone ca"; unk. primary; deceased 23s  . Prostate cancer Maternal Uncle        deceased 10  . Colon cancer Neg Hx   . Esophageal cancer Neg Hx   . Rectal cancer Neg Hx   . Stomach cancer Neg Hx     ALLERGIES:  is allergic to codeine.  MEDICATIONS:  Current Outpatient Medications  Medication Sig Dispense Refill  . aspirin 81 MG tablet Take 81 mg by mouth daily.     . diphenoxylate-atropine (LOMOTIL) 2.5-0.025 MG tablet Take 1 tablet by mouth 4 (four) times daily as needed for diarrhea or loose stools. 30 tablet 0  . glucose blood (TRUE METRIX BLOOD GLUCOSE TEST) test strip 1 each by Other route 3 (three) times daily. Use as instructed 600 each 2  . glyBURIDE-metformin (GLUCOVANCE) 2.5-500 MG tablet TAKE 2 TABLETS BY MOUTH 2 (TWO) TIMES DAILY WITH A MEAL. 360 tablet 1  . ibuprofen (ADVIL,MOTRIN) 200 MG tablet Take 200 mg by mouth every 8 (eight) hours as needed.      Marland Kitchen letrozole (FEMARA) 2.5 MG tablet Take 1 tablet (2.5 mg total) by mouth daily. 30 tablet 3  . lisinopril-hydrochlorothiazide (PRINZIDE,ZESTORETIC) 20-25 MG tablet TAKE 1 TABLET BY MOUTH DAILY. 90 tablet 0  . NERLYNX 40 MG tablet TAKE 6 TABLETS (240MG) BY MOUTH ONE TIME DAILY WITH FOOD. 180 tablet 1  . Omega-3 Fatty Acids (FISH OIL) 1200 MG CAPS Take 1 capsule by mouth daily.    . ondansetron (ZOFRAN-ODT) 4 MG disintegrating tablet Take 1 tablet (4 mg total) by mouth every 8 (eight) hours as needed for nausea or vomiting. 20 tablet 1  . Potassium Chloride ER 20 MEQ TBCR Take  40 mEq daily by mouth. 60 tablet 2  . torsemide (DEMADEX) 20 MG tablet Take 20 mg by mouth daily.    . TRUEPLUS LANCETS 33G MISC 1 each by Does not apply route 3 (three) times daily. 600 each 2   No current facility-administered medications for this visit.    Facility-Administered Medications Ordered in Other Visits  Medication Dose Route Frequency Provider Last Rate Last Dose  .  sodium chloride 0.9 % injection 10 mL  10 mL Intravenous PRN [name], MD   10 mL at 02/16/17 1004  . sodium chloride flush (NS) 0.9 % injection 10 mL  10 mL Intracatheter PRN [name], MD   10 mL at 06/24/17 1309    REVIEW OF SYSTEMS:   Constitutional: Denies fevers, chills or abnormal night sweats  Eyes: Denies blurriness of vision, double vision or watery eyes Ears, nose, mouth, throat, and face: Denies mucositis or sore throat Respiratory: Denies cough, dyspnea or wheezes Cardiovascular: Denies palpitation, chest discomfort Gastrointestinal:  Denies nausea, heartburn (+) more controlled diarrhea  Skin: Denies abnormal skin rashes  Lymphatics: Denies new lymphadenopathy or easy bruising Neurological:Denies numbness  MSK: (+) cramping in leg (+) joint aches  Behavioral/Psych: Mood is stable, no new changes  All other systems were reviewed with the patient and are negative.  PHYSICAL EXAMINATION:  ECOG PERFORMANCE STATUS: 1  Vitals:   10/11/17 0931  BP: 125/76  Pulse: 94  Resp: 17  Temp: 98.5 F (36.9 C)  SpO2: 100%   Filed Weights   10/11/17 0931  Weight: 232 lb 12.8 oz (105.6 kg)    GENERAL:alert, no distress and comfortable SKIN: skin color, texture, turgor are normal, no rashes or significant lesions EYES: normal, conjunctiva are pink and non-injected, sclera clear OROPHARYNX:no exudate, no erythema and lips, buccal mucosa, and tongue normal  NECK: supple, thyroid normal size, non-tender, without nodularity LYMPH:  no palpable lymphadenopathy in the cervical, axillary or inguinal LUNGS: clear to auscultation and percussion with normal breathing effort HEART: regular rate & rhythm and no murmurs  ABDOMEN:abdomen soft, non-tender and normal bowel sounds Musculoskeletal:no cyanosis of digits and no clubbing  PSYCH: alert & oriented x 3 with fluent speech NEURO: no focal motor/sensory deficits,  Breasts: Breast inspection showed left breast is surgically absent,  surgical incision has healed well. Exam of the right breast and bilateral axillas reviewed no palpable mass or adenopathy.  LABORATORY DATA:  I have reviewed the data as listed CBC Latest Ref Rng & Units 10/11/2017 10/04/2017 09/21/2017  WBC 3.9 - 10.3 10e3/uL 9.2 7.5 9.6  Hemoglobin 11.6 - 15.9 g/dL 10.0(L) 10.0(L) 9.4(L)  Hematocrit 34.8 - 46.6 % 29.6(L) 29.7(L) 28.1(L)  Platelets 145 - 400 10e3/uL 336 361.0 232   CMP Latest Ref Rng & Units 10/04/2017 09/21/2017 09/14/2017  Glucose 70 - 99 mg/dL 146(H) 132 118  BUN 6 - 23 mg/dL 20 19.7 35.2(H)  Creatinine 0.40 - 1.20 mg/dL 1.07 1.1 1.4(H)  Sodium 135 - 145 mEq/L 138 141 137  Potassium 3.5 - 5.1 mEq/L 3.6 4.1 5.1  Chloride 96 - 112 mEq/L 106 - -  CO2 19 - 32 mEq/L 23 25 20(L)  Calcium 8.4 - 10.5 mg/dL 9.7 8.8 9.6  Total Protein 6.0 - 8.3 g/dL 7.6 7.3 8.0  Total Bilirubin 0.2 - 1.2 mg/dL 0.3 0.31 <0.22  Alkaline Phos 39 - 117 U/L 66 66 68  AST 0 - 37 U/L 32 21 27  ALT 0 - 35 U/L 28 23 27  PATHOLOGY REPORT  Diagnosis 04/17/2016 Breast, left, needle core biopsy, inner mid breast - INVASIVE DUCTAL CARCINOMA WITH CALCIFICATIONS, SEE COMMENT. - DUCTAL CARCINOMA IN SITU WITH COMEDONECROSIS. Microscopic Comment While grading is best performed on the resection specimen the invasive carcinoma appears grade 2. Prognostic markers will be ordered and reported in an addendum. Dr. Lyndon Code has reviewed the case. The case was called to [facility] on 04/20/2016. Results: HER2 - *EQUIVOCAL* (Her2 by IHC will be ordered.) RATIO OF HER2/CEP17 SIGNALS 1.73 AVERAGE HER2 COPY NUMBER PER CELL 4.15 By immunohistochemistry, the tumor cells are positive for Her2 (3). Results: IMMUNOHISTOCHEMICAL AND MORPHOMETRIC ANALYSIS PERFORMED MANUALLY Estrogen Receptor: 100%, POSITIVE, STRONG STAINING INTENSITY Progesterone Receptor: 15%, POSITIVE, STRONG STAINING INTENSITY Proliferation Marker Ki67: 10%  Diagnosis 05/15/2016 Lymph node,  needle/core biopsy, right axilla - BENIGN REACTIVE LYMPH NODE. - NO GRANULOMAS OR MALIGNANCY IDENTIFIED. - SEE COMMENT. Microscopic Comment Internal departmental review obtained (Dr. Lyndon Code) with agreement. Results are phoned to [place] (05/18/16). (MEG:gt, 05/18/16)  Breast, simple mastectomy, Left 05/28/2016 - INVASIVE DUCTAL CARCINOMA, GRADE III/III, SPANNING 3.6 CM. - DUCTAL CARCINOMA IN SITU, HIGH GRADE. - LYMPHOVASCULAR INVASION IS IDENTIFIED. - THE SURGICAL RESECTION MARGINS ARE NEGATIVE FOR CARCINOMA. - SEE ONCOLOGY TABLE BELOW. Microscopic Comment BREAST, INVASIVE TUMOR, WITHOUT LYMPH NODES PRESENT Specimen, including laterality : Left breast Procedure: Simple mastectomy Histologic type: Ductal with micropapillary features Grade: III Tubule formation: 3 Nuclear pleomorphism: 2 Mitotic: 3 Tumor size (gross measurement): 3.6 cm Margins: Greater than 0.2 cm to all margins Lymphovascular invasion: Present Ductal carcinoma in situ: Present Grade: High grade Extensive intraductal component: Yes Lobular neoplasia: Not identified Tumor focality: Unifocal Treatment effect: N/A Extent of tumor: Confined to breast parenchyma. Breast prognostic profile: [phone] Estrogen receptor: 100%, strong staining intensity Progesterone receptor: 15%, strong staining intensity Her 2 neu: Amplification was detected by immunohistochemistry. Ki-67: 10% 1 of 2 FINAL for KIELEY, AKTER ([id number]) Microscopic Comment(continued) Non-neoplastic breast: No significant findings. TNM: pT2, pNX (clinically recurrent). Comments: In addition to the main tumor, there is ductal carcinoma in situ present in random tissue submitted from the inferior lateral quadrant. (JBK:kh 06-01-16)  PROCEDURES  Colonoscopy 09/22/17 IMPRESSION: - One 3 mm polyp in the transverse colon, removed with a cold biopsy forceps. Resected and retrieved. - Non-bleeding internal  hemorrhoids.   ECHO 07/01/17 Study Conclusions - Left ventricle: The cavity size was normal. Systolic function was   normal. The estimated ejection fraction was in the range of 55%   to 60%. Wall motion was normal; there were no regional wall   motion abnormalities. Doppler parameters are consistent with   abnormal left ventricular relaxation (grade 1 diastolic   dysfunction). - Aortic valve: Transvalvular velocity was within the normal range.   There was no stenosis. There was no regurgitation. - Mitral valve: Calcified annulus. Transvalvular velocity was   within the normal range. There was no evidence for stenosis.   There was no regurgitation. - Left atrium: The atrium was mildly dilated. - Atrial septum: The septum bowed from left to right, consistent   with increased left atrial pressure. - Tricuspid valve: There was mild regurgitation. - Pulmonary arteries: Systolic pressure was within the normal   range. PA peak pressure: 24 mm Hg (S). - Global longitudinal strain -20.5% (normal).   RADIOGRAPHIC STUDIES: I have personally reviewed the radiological images as listed and agreed with the findings in the report. No results found.   Bone Density 04/29/2017 DualFemur Neck Right 04/29/2017  65.9         -0.8    0.928 g/cm2 AP Spine  L1-L4      04/29/2017    65.9         1.7     1.406 g/cm2  Mammogram 04/29/2017 IMPRESSION: No mammographic evidence of malignancy. A result letter of this screening mammogram will be mailed directly to the patient.  DG Chest 2 View 10/28/2016 IMPRESSION: No acute cardiopulmonary abnormality.  ASSESSMENT & PLAN:  66 y.o. 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  1. Cancer of central portion of left breast, pT2NxM0, stage IIA, G3, triple positive -I previously reviewed her surgical pathology findings in great details  with patient and her family member. It showed a 3.6 cm mass, grade 3, surgical margins were negative. She had no sentinel lymph node biopsy due to her prior history of axillary lymph node dissection. -Her restaging CT scan of bone scan showed no evidence of distant metastasis, I reviewed with patient. -We again discussed the risk of cancer recurrence of this complete surgical resection. We reviewed that HER-2 positive with cancers are more aggressive, with increased risk of metastasis.  -she has completed adjuvant TCHP (docetaxel, carbotaxol, Herceptin, pejeta), and one year Herceptin therapy - her last echo was 5/18, EF normal  -Continue breast cancer surveillance. She is clinically doing well, exam unremarkable, last mammogram in May 2018 was negative. No clinical concern for recurrence.  -She started Nerlynx 264m on 08/26/17, was held on 09/14/17 due to significant diarrhea and restarted when her diarrhea resolved on 09/21/17.  She is tolerating well overall, still has loose bowel movement 2-4 times a day, I encouraged her to take Imodium and Lomotil as needed -She had colonoscopy on 09/22/17 which showed benign polyps that were removed and internal hemorrhoids.  -CBC from today and Labs from 10/04/17 and adequate to continue Nerlynx.  -F/u 12/28   2. Anemia  -Secondary to chemotherapy, her hemoglobin was previously 9.7, she has mild symptoms from anemia -Her H/H was normal before chemo  -Will continue to monitor, stable     3. DM and HTN  -She'll continue follow-up with her primary care physician -We again discussed her blood pressure and glucose level may be impacted by chemotherapy, and dexamethasone she takes before and after chemotherapy. -previously I strongly encouraged her to monitor her blood pressure and glucose at home, and consider increasing the dose of metformin and glyburide if needed -her BP and blood glucose has been normal/well controlled lately -A1c is at 8 as of 10/04/17  labs  4. Obesity  -I previously encouraged her to eat healthy and exercise regularly, she is willing to lose some weight after she completes chemotherapy.  5. Peripheral neuropathy, G1 -Secondary to chemotherapy, especially docetaxel -I previously encouraged her to take vitamin B complex. She does not feel she needs medication for now. -Was not mentioned today, likely improved or resolved.   6. Fatigue  -Probably related to her previous chemotherapy and anemia -I previously encouraged her to gradually increase her exercise   7. Bone Health -We previously discussed that Letrozole can cause some bone weakening  -She previously has had a bone density scan in the past, but it was several years ago -repeated DEXA scan in 03/2017 showed normal bone density  -I encouraged her to take calcium and vitamin D. We'll monitor her Vit D levels. -Her Bone density scan from 04/29/17 her  Femur Neck Right T-score is -0.8. This patient is considered normal. -Will repeat DEXA in 2020    8. Diarrhea -We reviewed the management of her diarrhea from medication, she will use Imodium 6-8 tablets a day when she takes nerlynx   -Better controlled with 6 imodium daily lomotil as needed  9. AKI -Her creatinine went up to 1.6 on 09/08/17, it was within normal A month ago. -This is likely secondary to her diarrhea from medication -resolved now    10. Joint pain and leg cramping -secondary to letrozole  -I suggest she take her high dose Vitamin D and calcium  -Overall this is manageable for her.    Plan -Continue Nerlynx, she knows to take Imodium and Lomotil as needed for diarrhea -Lab and f/u around 12/28  -Continue letrozole    All questions were answered. The patient knows to call the clinic with any problems, questions or concerns.  I spent 25 minutes counseling the patient face to face. The total time spent in the appointment was 30 minutes and more than 50% was on counseling.  This document  serves as a record of services personally performed by [name], MD. It was created on her behalf by Joslyn Devon, a trained medical scribe. The creation of this record is based on the scribe's personal observations and the provider's statements to them.    I have reviewed the above documentation for accuracy and completeness, and I agree with the above.    [name], MD 10/11/2017

## 2017-10-11 ENCOUNTER — Telehealth: Payer: Self-pay | Admitting: Hematology

## 2017-10-11 ENCOUNTER — Ambulatory Visit (HOSPITAL_BASED_OUTPATIENT_CLINIC_OR_DEPARTMENT_OTHER): Payer: Medicare PPO | Admitting: Hematology

## 2017-10-11 ENCOUNTER — Other Ambulatory Visit (HOSPITAL_BASED_OUTPATIENT_CLINIC_OR_DEPARTMENT_OTHER): Payer: Medicare PPO

## 2017-10-11 ENCOUNTER — Encounter: Payer: Self-pay | Admitting: Hematology

## 2017-10-11 VITALS — BP 125/76 | HR 94 | Temp 98.5°F | Resp 17 | Ht 67.0 in | Wt 232.8 lb

## 2017-10-11 DIAGNOSIS — R197 Diarrhea, unspecified: Secondary | ICD-10-CM | POA: Diagnosis not present

## 2017-10-11 DIAGNOSIS — Z79811 Long term (current) use of aromatase inhibitors: Secondary | ICD-10-CM

## 2017-10-11 DIAGNOSIS — Z170 Estrogen receptor positive status [ER+]: Secondary | ICD-10-CM

## 2017-10-11 DIAGNOSIS — R5383 Other fatigue: Secondary | ICD-10-CM

## 2017-10-11 DIAGNOSIS — I10 Essential (primary) hypertension: Secondary | ICD-10-CM

## 2017-10-11 DIAGNOSIS — C50112 Malignant neoplasm of central portion of left female breast: Secondary | ICD-10-CM | POA: Diagnosis not present

## 2017-10-11 DIAGNOSIS — Z853 Personal history of malignant neoplasm of breast: Secondary | ICD-10-CM | POA: Diagnosis not present

## 2017-10-11 DIAGNOSIS — D6481 Anemia due to antineoplastic chemotherapy: Secondary | ICD-10-CM | POA: Diagnosis not present

## 2017-10-11 DIAGNOSIS — G620 Drug-induced polyneuropathy: Secondary | ICD-10-CM

## 2017-10-11 DIAGNOSIS — E119 Type 2 diabetes mellitus without complications: Secondary | ICD-10-CM | POA: Diagnosis not present

## 2017-10-11 LAB — CBC WITH DIFFERENTIAL/PLATELET
BASO%: 0.7 % (ref 0.0–2.0)
Basophils Absolute: 0.1 10*3/uL (ref 0.0–0.1)
EOS%: 3.2 % (ref 0.0–7.0)
Eosinophils Absolute: 0.3 10*3/uL (ref 0.0–0.5)
HCT: 29.6 % — ABNORMAL LOW (ref 34.8–46.6)
HGB: 10 g/dL — ABNORMAL LOW (ref 11.6–15.9)
LYMPH%: 30.2 % (ref 14.0–49.7)
MCH: 34.1 pg — ABNORMAL HIGH (ref 25.1–34.0)
MCHC: 33.6 g/dL (ref 31.5–36.0)
MCV: 101.4 fL — ABNORMAL HIGH (ref 79.5–101.0)
MONO#: 0.7 10*3/uL (ref 0.1–0.9)
MONO%: 7.2 % (ref 0.0–14.0)
NEUT#: 5.4 10*3/uL (ref 1.5–6.5)
NEUT%: 58.7 % (ref 38.4–76.8)
Platelets: 336 10*3/uL (ref 145–400)
RBC: 2.92 10*6/uL — ABNORMAL LOW (ref 3.70–5.45)
RDW: 13.7 % (ref 11.2–14.5)
WBC: 9.2 10*3/uL (ref 3.9–10.3)
lymph#: 2.8 10*3/uL (ref 0.9–3.3)

## 2017-10-11 MED ORDER — LISINOPRIL-HYDROCHLOROTHIAZIDE 20-25 MG PO TABS: 1.0000 | ORAL_TABLET | Freq: Every day | ORAL | 1 refills | Status: DC

## 2017-10-11 MED ORDER — VITAMIN D (ERGOCALCIFEROL) 1.25 MG (50000 UNIT) PO CAPS: 50000.0000 [IU] | ORAL_CAPSULE | ORAL | 0 refills | Status: DC

## 2017-10-11 MED ORDER — POTASSIUM CHLORIDE ER 20 MEQ PO TBCR: 40.0000 meq | EXTENDED_RELEASE_TABLET | Freq: Every day | ORAL | 2 refills | Status: DC

## 2017-10-11 MED ORDER — SITAGLIPTIN PHOSPHATE 50 MG PO TABS: 50.0000 mg | ORAL_TABLET | Freq: Every day | ORAL | 2 refills | Status: DC

## 2017-10-11 NOTE — Addendum Note (Signed)
Addended by: Lurlean Nanny on: 10/11/2017 03:01 PM   Modules accepted: Orders

## 2017-10-11 NOTE — Telephone Encounter (Signed)
Scheduled appt per 11/12 los - Gave patient AVS and calender per los.  

## 2017-10-12 ENCOUNTER — Telehealth: Payer: Self-pay | Admitting: Internal Medicine

## 2017-10-12 MED ORDER — SITAGLIPTIN PHOSPHATE 50 MG PO TABS: 50.0000 mg | ORAL_TABLET | Freq: Every day | ORAL | 2 refills | Status: DC

## 2017-10-12 NOTE — Telephone Encounter (Signed)
Spoke to pt and [place] and Rx has been sent to [facility]

## 2017-10-12 NOTE — Telephone Encounter (Signed)
Copied from [place] [id number]. Topic: General - Other >> Oct 12, 2017  8:26 AM Darl Householder, RMA wrote: Reason for CRM: patient is requesting a call back concerning changing her medication from [facility] to [facility] due to medication cost

## 2017-11-03 ENCOUNTER — Telehealth: Payer: Self-pay

## 2017-11-03 NOTE — Telephone Encounter (Signed)
Spoke with reminding her of [facility] appt on 11/11/17 at 9 am.  Pt says she will come to appt.

## 2017-11-10 ENCOUNTER — Telehealth: Payer: Self-pay | Admitting: Hematology

## 2017-11-10 NOTE — Telephone Encounter (Signed)
Patient called in to reschedule  °

## 2017-11-11 ENCOUNTER — Encounter: Payer: Medicare PPO | Admitting: Adult Health

## 2017-11-15 ENCOUNTER — Encounter: Payer: Medicare PPO | Admitting: Adult Health

## 2017-11-24 ENCOUNTER — Telehealth: Payer: Self-pay

## 2017-11-24 NOTE — Telephone Encounter (Signed)
Spoke with patient reminding her of [facility] appt on 12/07/17 at 10 am.  Pt gave thanks for reminder and hung up.

## 2017-11-29 ENCOUNTER — Ambulatory Visit: Payer: Medicare PPO | Admitting: Hematology

## 2017-11-29 ENCOUNTER — Other Ambulatory Visit: Payer: Medicare PPO

## 2017-12-07 ENCOUNTER — Telehealth: Payer: Self-pay | Admitting: Adult Health

## 2017-12-07 ENCOUNTER — Encounter: Payer: Self-pay | Admitting: Adult Health

## 2017-12-07 ENCOUNTER — Inpatient Hospital Stay: Payer: Medicare PPO

## 2017-12-07 ENCOUNTER — Inpatient Hospital Stay: Payer: Medicare PPO | Attending: Adult Health | Admitting: Adult Health

## 2017-12-07 VITALS — BP 144/59 | HR 95 | Temp 98.1°F | Resp 18 | Ht 67.0 in | Wt 237.0 lb

## 2017-12-07 DIAGNOSIS — K648 Other hemorrhoids: Secondary | ICD-10-CM | POA: Diagnosis not present

## 2017-12-07 DIAGNOSIS — Z79899 Other long term (current) drug therapy: Secondary | ICD-10-CM | POA: Insufficient documentation

## 2017-12-07 DIAGNOSIS — C50112 Malignant neoplasm of central portion of left female breast: Secondary | ICD-10-CM | POA: Diagnosis not present

## 2017-12-07 DIAGNOSIS — Z923 Personal history of irradiation: Secondary | ICD-10-CM | POA: Diagnosis not present

## 2017-12-07 DIAGNOSIS — D123 Benign neoplasm of transverse colon: Secondary | ICD-10-CM | POA: Insufficient documentation

## 2017-12-07 DIAGNOSIS — Z9221 Personal history of antineoplastic chemotherapy: Secondary | ICD-10-CM

## 2017-12-07 DIAGNOSIS — Z170 Estrogen receptor positive status [ER+]: Secondary | ICD-10-CM | POA: Diagnosis not present

## 2017-12-07 DIAGNOSIS — E119 Type 2 diabetes mellitus without complications: Secondary | ICD-10-CM | POA: Insufficient documentation

## 2017-12-07 DIAGNOSIS — Z853 Personal history of malignant neoplasm of breast: Secondary | ICD-10-CM | POA: Diagnosis not present

## 2017-12-07 DIAGNOSIS — I10 Essential (primary) hypertension: Secondary | ICD-10-CM | POA: Diagnosis not present

## 2017-12-07 DIAGNOSIS — Z79811 Long term (current) use of aromatase inhibitors: Secondary | ICD-10-CM | POA: Diagnosis not present

## 2017-12-07 DIAGNOSIS — Z171 Estrogen receptor negative status [ER-]: Secondary | ICD-10-CM

## 2017-12-07 DIAGNOSIS — Z9012 Acquired absence of left breast and nipple: Secondary | ICD-10-CM | POA: Diagnosis not present

## 2017-12-07 DIAGNOSIS — Z7982 Long term (current) use of aspirin: Secondary | ICD-10-CM | POA: Insufficient documentation

## 2017-12-07 LAB — COMPREHENSIVE METABOLIC PANEL
ALT: 31 U/L (ref 0–55)
AST: 29 U/L (ref 5–34)
Albumin: 4 g/dL (ref 3.5–5.0)
Alkaline Phosphatase: 81 U/L (ref 40–150)
Anion gap: 7 (ref 3–11)
BUN: 10 mg/dL (ref 7–26)
CO2: 28 mmol/L (ref 22–29)
Calcium: 9.7 mg/dL (ref 8.4–10.4)
Chloride: 105 mmol/L (ref 98–109)
Creatinine, Ser: 0.99 mg/dL (ref 0.60–1.10)
GFR calc Af Amer: >60 mL/min (ref >60)
GFR calc non Af Amer: 58 mL/min — ABNORMAL LOW (ref >60)
Glucose, Bld: 161 mg/dL — ABNORMAL HIGH (ref 70–140)
Potassium: 4.3 mmol/L (ref 3.3–4.7)
Sodium: 140 mmol/L (ref 136–145)
Total Bilirubin: 0.3 mg/dL (ref 0.2–1.2)
Total Protein: 7.9 g/dL (ref 6.4–8.3)

## 2017-12-07 LAB — CBC WITH DIFFERENTIAL/PLATELET
Abs Granulocyte: 4.2 10*3/uL (ref 1.5–6.5)
Basophils Absolute: 0 10*3/uL (ref 0.0–0.1)
Basophils Relative: 0 %
Eosinophils Absolute: 0.2 10*3/uL (ref 0.0–0.5)
Eosinophils Relative: 3 %
HCT: 31.4 % — ABNORMAL LOW (ref 34.8–46.6)
Hemoglobin: 10.4 g/dL — ABNORMAL LOW (ref 11.6–15.9)
Lymphocytes Relative: 26 %
Lymphs Abs: 1.7 10*3/uL (ref 0.9–3.3)
MCH: 34.2 pg — ABNORMAL HIGH (ref 25.1–34.0)
MCHC: 33.2 g/dL (ref 31.5–36.0)
MCV: 102.8 fL — ABNORMAL HIGH (ref 79.5–101.0)
Monocytes Absolute: 0.5 10*3/uL (ref 0.1–0.9)
Monocytes Relative: 7 %
Neutro Abs: 4.2 10*3/uL (ref 1.5–6.5)
Neutrophils Relative %: 64 %
Platelets: 357 10*3/uL (ref 145–400)
RBC: 3.06 MIL/uL — ABNORMAL LOW (ref 3.70–5.45)
RDW: 14 % (ref 11.2–16.1)
WBC: 6.5 10*3/uL (ref 3.9–10.3)

## 2017-12-07 NOTE — Progress Notes (Signed)
CLINIC:  Survivorship   REASON FOR VISIT:  Routine follow-up post-treatment for a recent history of breast cancer.  BRIEF ONCOLOGIC HISTORY:  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 [facility])   Staging form: Breast, AJCC 7th Edition     Clinical stage from 04/17/2016: Stage IA (T1c, N0, M0) - Signed by [name], MD on 05/07/2016     Pathologic stage from 05/28/2016: Stage IIA (T2, N0, cM0) - Signed by [name], MD on 06/11/2016 History of left breast cancer   Staging form: Breast, AJCC 7th Edition     Clinical: Stage IIIB (T4d, N1, cM0) - Signed by [name], MD on 12/14/2013     Pathologic: Stage IIIB (T4d, N1a, cM0) - Signed by [name], MD on 12/14/2013       History of left breast cancer   09/12/2002 Imaging    CT scan show no evidence of disease apart from the left axilla      09/2002 -  Neo-Adjuvant Chemotherapy    TAC X4 cycles       01/10/2003 Surgery    The patient had left lumpectomy and axillary lymph node dissection which show residual breast cancer and a sentinel lymph node was positive      2004 -  Adjuvant Chemotherapy    Cytoxan with 5-FU x4 cycles (methotrexate added at cycle 4).      2004 -  Radiation Therapy    Adjuvant left breast radiation      12/2002 Receptors her2    ER, PR and HER-2 were all negative.       Cancer of central portion of left breast ([place])   04/13/2016 Mammogram    Scheduler coarse heterogeneous calcifications spanning an area of 3.7 cm in the lumpectomy site, indeterminate. Ultrasound of the left axilla was negative.      04/17/2016 Initial Diagnosis    Cancer of central portion of left breast ([place])      04/17/2016 Initial Biopsy    Left breast core needle biopsy showed invasive and in situ ductal carcinoma with calcification, grade 2.      04/17/2016 Receptors her2    ER 100% positive, PR 15% positive, strong staining, Ki-67 10%, HER-2 positive by IHC (3)      05/05/2016 Imaging    Bilateral breast MRI  showed a 1.3 x 0.8 x 0.7 cm biopsy-proven ductal carcinoma in the region of the previous lumpectomy. Enlarged lobulated right inferior axillary lymph node with diffuse cortical thickening, biopsy is recommended.      05/15/2016 Pathology Results    right axilary node biopsy was negative for malignant cell       05/28/2016 Surgery    Simple left mastectomy      05/28/2016 Pathology Results    Left mastectomy showed invasive ductal carcinoma, grade 3, 3.6 cm, high grade DCIS, (+) LVI, surgical margins were negative. Tumor 3.6 cm, no lymph nodes identified.      06/25/2016 Imaging    Staging CT scan of the chest, abdomen and pelvis with contrast and a bone scan showed no evidence of metastasis. Postoperative fluid collection in the left breast, likely a seroma or hematoma. Right axillary adenopathy, which was biopsied previously.       07/07/2016 - 07/15/2017 Chemotherapy    Adjuvant chemotherapy with docetaxel, carboplatin, Herceptin and pejeta every 3 weeks, for 6 cycles, followed by Herceptin maintenance therapy for total of one year treatment  Ended [facility] 11/04/16  12/15/2016 -  Anti-estrogen oral therapy    Adjuvant letrozole 2.5 mg once daily      04/29/2017 Mammogram     Mammogram 04/29/2017 IMPRESSION: No mammographic evidence of malignancy. A result letter of this screening mammogram will be mailed directly to the patient.       09/22/2017 Procedure    Colonoscopy by Dr. Silverio Decamp  IMPRESSION: - One 3 mm polyp in the transverse colon, removed with a cold biopsy forceps. Resected and retrieved. - Non-bleeding internal hemorrhoids.      09/22/2017 Pathology Results    Diagnosis, colonoscopy  Surgical [P], transverse, polyp - TUBULAR ADENOMA(S). - HIGH GRADE DYSPLASIA IS NOT IDENTIFIED.       INTERVAL HISTORY:  Lindsey Marquez presents to the [facility] today for our initial meeting to review her survivorship care plan detailing her treatment course for breast  cancer, as well as monitoring long-term side effects of that treatment, education regarding health maintenance, screening, and overall wellness and health promotion.     Overall, Lindsey Marquez reports feeling quite well.  She is taking Letrozole daily and is tolerating it well.  She denies any issues with taking it.  She has not yet gotten back into exercise.     REVIEW OF SYSTEMS:  Review of Systems  Constitutional: Negative for appetite change, chills, fatigue, fever and unexpected weight change.  HENT:   Negative for hearing loss, lump/mass and trouble swallowing.   Eyes: Negative for eye problems and icterus.  Respiratory: Negative for chest tightness, cough and shortness of breath.   Cardiovascular: Negative for chest pain, leg swelling and palpitations.  Gastrointestinal: Negative for abdominal distention, abdominal pain, constipation, diarrhea, nausea and vomiting.  Endocrine: Negative for hot flashes.  Genitourinary: Negative for difficulty urinating.   Musculoskeletal: Negative for arthralgias.  Skin: Negative for itching and rash.  Neurological: Negative for dizziness, extremity weakness, headaches and numbness.  Hematological: Negative for adenopathy. Does not bruise/bleed easily.  Psychiatric/Behavioral: Negative for depression. The patient is not nervous/anxious.   Breast: Denies any new nodularity, masses, tenderness, nipple changes, or nipple discharge.      ONCOLOGY TREATMENT TEAM:  1. Surgeon:  Dr. Georgette Dover at [facility] 2. Medical Oncologist: Dr. Burr Medico      PAST MEDICAL/SURGICAL HISTORY:  Past Medical History:  Diagnosis Date  . Blood transfusion without reported diagnosis   . Breast cancer [facility]) August 2002   Invasive ductal carcinoma Left breast. 2002, 2017  . Diabetes mellitus without complication ([place])   . Family history of malignant neoplasm of ovary   . Family history of pancreatic cancer   . Hypertension    Past Surgical History:  Procedure  Laterality Date  . BREAST SURGERY Left 2003  . COLONOSCOPY    . FOOT SURGERY  2000  . MASTECTOMY Left 05/28/2016  . port a cath insertion  05/28/2016  . PORT-A-CATH REMOVAL Right 08/19/2017   Procedure: REMOVAL PORT-A-CATH;  Surgeon: [name], MD;  Location: [place];  Service: General;  Laterality: Right;  . PORTACATH PLACEMENT Right 05/28/2016   Procedure: INSERTION PORT-A-CATH;  Surgeon: [name], MD;  Location: [place];  Service: General;  Laterality: Right;  . TOTAL MASTECTOMY Left 05/28/2016   Procedure: LEFT  MASTECTOMY;  Surgeon: [name], MD;  Location: [place];  Service: General;  Laterality: Left;  . TUBAL LIGATION  22 yrs since  2004.   Bilateral.     ALLERGIES:  Allergies  Allergen Reactions  . Codeine     "  get crazy", "see things that aren't there"     CURRENT MEDICATIONS:  Outpatient Encounter Medications as of 12/07/2017  Medication Sig  . aspirin 81 MG tablet Take 81 mg by mouth daily.   . diphenoxylate-atropine (LOMOTIL) 2.5-0.025 MG tablet Take 1 tablet by mouth 4 (four) times daily as needed for diarrhea or loose stools.  Marland Kitchen glucose blood (TRUE METRIX BLOOD GLUCOSE TEST) test strip 1 each by Other route 3 (three) times daily. Use as instructed  . glyBURIDE-metformin (GLUCOVANCE) 2.5-500 MG tablet TAKE 2 TABLETS BY MOUTH 2 (TWO) TIMES DAILY WITH A MEAL.  Marland Kitchen ibuprofen (ADVIL,MOTRIN) 200 MG tablet Take 200 mg by mouth every 8 (eight) hours as needed.    Marland Kitchen letrozole (FEMARA) 2.5 MG tablet Take 1 tablet (2.5 mg total) by mouth daily.  Marland Kitchen lisinopril-hydrochlorothiazide (PRINZIDE,ZESTORETIC) 20-25 MG tablet Take 1 tablet daily by mouth.  . NERLYNX 40 MG tablet TAKE 6 TABLETS (240MG) BY MOUTH ONE TIME DAILY WITH FOOD.  Marland Kitchen Omega-3 Fatty Acids (FISH OIL) 1200 MG CAPS Take 1 capsule by mouth daily.  . ondansetron (ZOFRAN-ODT) 4 MG disintegrating tablet Take 1 tablet (4 mg total) by mouth every 8 (eight) hours as needed for nausea or vomiting.  .  Potassium Chloride ER 20 MEQ TBCR Take 40 mEq daily by mouth.  . sitaGLIPtin (JANUVIA) 50 MG tablet Take 1 tablet (50 mg total) daily by mouth.  . torsemide (DEMADEX) 20 MG tablet Take 20 mg by mouth daily.  . TRUEPLUS LANCETS 33G MISC 1 each by Does not apply route 3 (three) times daily.  . Vitamin D, Ergocalciferol, (DRISDOL) 50000 units CAPS capsule Take 1 capsule (50,000 Units total) every 7 (seven) days by mouth.   Facility-Administered Encounter Medications as of 12/07/2017  Medication  . sodium chloride 0.9 % injection 10 mL  . sodium chloride flush (NS) 0.9 % injection 10 mL     ONCOLOGIC FAMILY HISTORY:  Family History  Problem Relation Age of Onset  . Prostate cancer Father   . Ovarian cancer Maternal Aunt 77       deceased  . Pancreatic cancer Mother 39       deceased  . Cancer Maternal Aunt        2 other mat aunts; unk. primary in 9s; deceased  . Cancer Maternal Uncle        "bone ca"; unk. primary; deceased 26s  . Prostate cancer Maternal Uncle        deceased 43  . Colon cancer Neg Hx   . Esophageal cancer Neg Hx   . Rectal cancer Neg Hx   . Stomach cancer Neg Hx      GENETIC COUNSELING/TESTING: See above  SOCIAL HISTORY:  Lindsey Marquez is single and her 5 children live with her in [place], [place].  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  She has kids in her home ranging from 2-14 years old.  She is not working and is currently retired.  She denies any current or history of tobacco, alcohol, or illicit drug use.     PHYSICAL EXAMINATION:  Vital Signs:   Vitals:   12/07/17 1006  BP: (!) 144/59  Pulse: 95  Resp: 18  Temp: 98.1 F (36.7 C)  SpO2: 100%   Filed Weights   12/07/17 1006  Weight: 237 lb (107.5 kg)   General: Well-nourished, well-appearing female in no acute distress.  She is unaccompanied today.   HEENT: Head is normocephalic.  Pupils  equal and reactive to light. Conjunctivae clear without exudate.   Sclerae anicteric. Oral mucosa is pink, moist.  Oropharynx is pink without lesions or erythema.  Lymph: No cervical, supraclavicular, or infraclavicular lymphadenopathy noted on palpation.  Cardiovascular: Regular rate and rhythm.Marland Kitchen Respiratory: Clear to auscultation bilaterally. Chest expansion symmetric; breathing non-labored.  GI: Abdomen soft and round; non-tender, non-distended. Bowel sounds normoactive.  GU: Deferred.  Neuro: No focal deficits. Steady gait.  Psych: Mood and affect normal and appropriate for situation.  Extremities: No edema. MSK: No focal spinal tenderness to palpation.  Full range of motion in bilateral upper extremities Skin: Warm and dry.  LABORATORY DATA:  None for this visit.  DIAGNOSTIC IMAGING:  None for this visit.      ASSESSMENT AND PLAN:  Ms.. Marquez is a pleasant 67 y.o. female with Stage IIA left breast invasive ductal carcinoma, ER+/PR+/HER2+, diagnosed in 03/2016, treated with mastectomy, adjuvant chemotherapy, one year of maintenance Herceptin, and anti-estrogen therapy with Letrozole beginning in 11/2016.  She presents to the Survivorship Clinic for our initial meeting and routine follow-up post-completion of treatment for breast cancer.    1. Stage IIA left breast cancer:  Lindsey Marquez is continuing to recover from definitive treatment for breast cancer. She will follow-up with her medical oncologist, Dr. Burr Medico in 01/2018 with history and physical exam per surveillance protocol.  She will continue her anti-estrogen therapy with Letrozole. Thus far, she is tolerating the Letrozole well, with minimal side effects.  She will be due for mammogram in 03/2018 and I reviewed this with her today.  Today, a comprehensive survivorship care plan and treatment summary was reviewed with the patient today detailing her breast cancer diagnosis, treatment course, potential late/long-term effects of treatment, appropriate follow-up care with recommendations for the future, and  patient education resources.  A copy of this summary, along with a letter will be sent to the patient's primary care provider via mail/fax/In Basket message after today's visit.    2. Bone health:  Given Lindsey Marquez's age/history of breast cancer and her current treatment regimen including anti-estrogen therapy with Letrozole, she is at risk for bone demineralization.  Her last DEXA scan was 04/29/2017 and was normal with the lowest T score being -0.8 in the right femur.  She will be due for repeat bone density on 04/30/2019.  She was given education on specific activities to promote bone health.  3. Cancer screening:  Due to Lindsey Marquez's history and her age, she should receive screening for skin cancers, colon cancer, and gynecologic cancers.  The information and recommendations are listed on the patient's comprehensive care plan/treatment summary and were reviewed in detail with the patient.    4. Health maintenance and wellness promotion: Lindsey Marquez was encouraged to consume 5-7 servings of fruits and vegetables per day. We reviewed the "Nutrition Rainbow" handout, as well as the handout "Take Control of Your Health and Reduce Your Cancer Risk" from the [employer].  She was also encouraged to engage in moderate to vigorous exercise for 30 minutes per day most days of the week. We discussed the [employer] fitness program, which is designed for cancer survivors to help them become more physically fit after cancer treatments.  She was instructed to limit her alcohol consumption and continue to abstain from tobacco use.     5. Support services/counseling: It is not uncommon for this period of the patient's cancer care trajectory to be one of many emotions and stressors.  We discussed an  opportunity for her to participate in the next session of [facility] ("Finding Your New Normal") support group series designed for patients after they have completed treatment.   Lindsey Marquez was encouraged to take advantage of  our many other support services programs, support groups, and/or counseling in coping with her new life as a cancer survivor after completing anti-cancer treatment.  She was offered support today through active listening and expressive supportive counseling.  She was given information regarding our available services and encouraged to contact me with any questions or for help enrolling in any of our support group/programs.    Dispo:   -Return to cancer center for f/u with Dr. Burr Medico in 01/2018 -Mammogram due in 04/29/2018 -Bone density due 04/30/2019 -Follow up with Dr. Georgette Dover in 05/2018 -She is welcome to return back to the Survivorship Clinic at any time; no additional follow-up needed at this time.  -Consider referral back to survivorship as a long-term survivor for continued surveillance  A total of (30) minutes of face-to-face time was spent with this patient with greater than 50% of that time in counseling and care-coordination.   Gardenia Phlegm, NP Survivorship Program [facility] [phone]   Note: PRIMARY CARE PROVIDER [name], [place] [phone]

## 2017-12-07 NOTE — Telephone Encounter (Signed)
Gave patient AVS and calendar of upcoming March appointments.  °

## 2018-01-01 ENCOUNTER — Other Ambulatory Visit: Payer: Self-pay | Admitting: Internal Medicine

## 2018-01-04 ENCOUNTER — Ambulatory Visit (INDEPENDENT_AMBULATORY_CARE_PROVIDER_SITE_OTHER): Payer: Medicare HMO | Admitting: Internal Medicine

## 2018-01-04 ENCOUNTER — Encounter: Payer: Self-pay | Admitting: Internal Medicine

## 2018-01-04 VITALS — BP 140/72 | HR 89 | Temp 97.8°F | Wt 235.5 lb

## 2018-01-04 DIAGNOSIS — R059 Cough, unspecified: Secondary | ICD-10-CM

## 2018-01-04 DIAGNOSIS — E559 Vitamin D deficiency, unspecified: Secondary | ICD-10-CM

## 2018-01-04 DIAGNOSIS — I10 Essential (primary) hypertension: Secondary | ICD-10-CM

## 2018-01-04 DIAGNOSIS — R05 Cough: Secondary | ICD-10-CM | POA: Diagnosis not present

## 2018-01-04 DIAGNOSIS — E119 Type 2 diabetes mellitus without complications: Secondary | ICD-10-CM | POA: Diagnosis not present

## 2018-01-04 DIAGNOSIS — K439 Ventral hernia without obstruction or gangrene: Secondary | ICD-10-CM | POA: Diagnosis not present

## 2018-01-04 LAB — BASIC METABOLIC PANEL
BUN: 19 mg/dL (ref 6–23)
CO2: 27 mEq/L (ref 19–32)
Calcium: 9.2 mg/dL (ref 8.4–10.5)
Chloride: 105 mEq/L (ref 96–112)
Creatinine, Ser: 0.99 mg/dL (ref 0.40–1.20)
GFR: 72.04 mL/min (ref >60.00)
Glucose, Bld: 161 mg/dL — ABNORMAL HIGH (ref 70–99)
Potassium: 3.8 mEq/L (ref 3.5–5.1)
Sodium: 139 mEq/L (ref 135–145)

## 2018-01-04 LAB — VITAMIN D 25 HYDROXY (VIT D DEFICIENCY, FRACTURES): VITD: 25.4 ng/mL — ABNORMAL LOW (ref 30.00–100.00)

## 2018-01-04 LAB — HEMOGLOBIN A1C: Hgb A1c MFr Bld: 7.4 % — ABNORMAL HIGH (ref 4.6–6.5)

## 2018-01-04 MED ORDER — DEXTROMETHORPHAN-GUAIFENESIN 10-100 MG/5ML PO LIQD: 5.0000 mL | ORAL | 0 refills | Status: DC | PRN

## 2018-01-04 NOTE — Assessment & Plan Note (Signed)
Elevated, but she reports she does not take her blood pressure medication before her visits Advised her to please start doing this so I can get an accurate blood pressure reading Reinforced DASH diet and exercise for weight loss BMET today

## 2018-01-04 NOTE — Assessment & Plan Note (Signed)
A1C today No microalbumin secondary to ACEI therapy Encouraged her to consume a low carb diet and exercise for weight loss Continue Metformin-Glyburide and Januvia, will adjust if needed based on labs Foot exam today Encouraged yearly eye exams  Immunizations are UTD

## 2018-01-04 NOTE — Progress Notes (Signed)
Subjective:    Patient ID: Lindsey Marquez, female    DOB: 07-03-1951, 67 y.o.   MRN: [id number]  HPI  Pt presents to the clinic today for follow up chronic conditions:  DM 2: Her last A1C was 8%, 09/2017 . Her sugars 98-105. She is taking Metformin-Glyburide and Januvia as prescribed. She checks her feet daily. Her last eye exam was 07/2017. Her flu and pneumonia vaccines are UTD.  Vit D Deficiency: She has completed the 12 weeks of Ergocalciferol. She is not taking any Vit D OTC.  HTN: BP today is 140/92, but she reports she never takes her blood pressure medicine before she comes to the doctor, she always takes it after. She is prescribed Lisnopril HCT and a Potassium supplement. She is taking Torsemide as well. She does not monitor her blood pressures at home. She denies headache, dizziness, visual changes or chest tightness.  She also wants to discuss her ventral hernia. She feels like it is getting bigger in size. She is noticing more often that it is there but she denies pain. She is interested in referral to have this fixed.  Review of Systems      Past Medical History:  Diagnosis Date  . Blood transfusion without reported diagnosis   . Breast cancer [facility]) August 2002   Invasive ductal carcinoma Left breast. 2002, 2017  . Diabetes mellitus without complication ([place])   . Family history of malignant neoplasm of ovary   . Family history of pancreatic cancer   . Hypertension     Current Outpatient Medications  Medication Sig Dispense Refill  . aspirin 81 MG tablet Take 81 mg by mouth daily.     . diphenoxylate-atropine (LOMOTIL) 2.5-0.025 MG tablet Take 1 tablet by mouth 4 (four) times daily as needed for diarrhea or loose stools. 30 tablet 0  . glucose blood (TRUE METRIX BLOOD GLUCOSE TEST) test strip 1 each by Other route 3 (three) times daily. Use as instructed 600 each 2  . glyBURIDE-metformin (GLUCOVANCE) 2.5-500 MG tablet TAKE 2 TABLETS BY MOUTH 2 (TWO) TIMES DAILY  WITH A MEAL. 360 tablet 1  . ibuprofen (ADVIL,MOTRIN) 200 MG tablet Take 200 mg by mouth every 8 (eight) hours as needed.      Marland Kitchen letrozole (FEMARA) 2.5 MG tablet Take 1 tablet (2.5 mg total) by mouth daily. 30 tablet 3  . lisinopril-hydrochlorothiazide (PRINZIDE,ZESTORETIC) 20-25 MG tablet Take 1 tablet daily by mouth. 90 tablet 1  . NERLYNX 40 MG tablet TAKE 6 TABLETS (240MG ) BY MOUTH ONE TIME DAILY WITH FOOD. 180 tablet 1  . Omega-3 Fatty Acids (FISH OIL) 1200 MG CAPS Take 1 capsule by mouth daily.    . ondansetron (ZOFRAN-ODT) 4 MG disintegrating tablet Take 1 tablet (4 mg total) by mouth every 8 (eight) hours as needed for nausea or vomiting. 20 tablet 1  . Potassium Chloride ER 20 MEQ TBCR Take 40 mEq daily by mouth. 60 tablet 2  . sitaGLIPtin (JANUVIA) 50 MG tablet Take 1 tablet (50 mg total) daily by mouth. 30 tablet 2  . torsemide (DEMADEX) 20 MG tablet Take 20 mg by mouth daily.    . TRUEPLUS LANCETS 33G MISC 1 each by Does not apply route 3 (three) times daily. 600 each 2  . Vitamin D, Ergocalciferol, (DRISDOL) 50000 units CAPS capsule Take 1 capsule (50,000 Units total) every 7 (seven) days by mouth. 12 capsule 0   No current facility-administered medications for this visit.    Facility-Administered Medications Ordered  in Other Visits  Medication Dose Route Frequency Provider Last Rate Last Dose  . sodium chloride 0.9 % injection 10 mL  10 mL Intravenous PRN [name], MD   10 mL at 02/16/17 1004  . sodium chloride flush (NS) 0.9 % injection 10 mL  10 mL Intracatheter PRN [name], MD   10 mL at 06/24/17 1309    Allergies  Allergen Reactions  . Codeine     "get crazy", "see things that aren't there"    Family History  Problem Relation Age of Onset  . Prostate cancer Father   . Ovarian cancer Maternal Aunt 7       deceased  . Pancreatic cancer Mother 8       deceased  . Cancer Maternal Aunt        2 other mat aunts; unk. primary in 40s; deceased  . Cancer Maternal  Uncle        "bone ca"; unk. primary; deceased 13s  . Prostate cancer Maternal Uncle        deceased 12  . Colon cancer Neg Hx   . Esophageal cancer Neg Hx   . Rectal cancer Neg Hx   . Stomach cancer Neg Hx     Social History   Socioeconomic History  . Marital status: Divorced    Spouse name: Not on file  . Number of children: Not on file  . Years of education: Not on file  . Highest education level: Not on file  Social Needs  . Financial resource strain: Not on file  . Food insecurity - worry: Not on file  . Food insecurity - inability: Not on file  . Transportation needs - medical: Not on file  . Transportation needs - non-medical: Not on file  Occupational History  . Not on file  Tobacco Use  . Smoking status: Former Smoker    Packs/day: 0.10    Years: 7.00    Pack years: 0.70    Last attempt to quit: 12/01/1975    Years since quitting: 42.1  . Smokeless tobacco: Never Used  Substance and Sexual Activity  . Alcohol use: No    Alcohol/week: 0.0 oz  . Drug use: No  . Sexual activity: Yes  Other Topics Concern  . Not on file  Social History Narrative  . Not on file     Constitutional: Denies fever, malaise, fatigue, headache or abrupt weight changes.  Respiratory: Pt reports cough. Denies difficulty breathing, shortness of breath, or sputum production.   Cardiovascular: Denies chest pain, chest tightness, palpitations or swelling in the hands or feet.  Skin: Denies redness, rashes, lesions or ulcercations.  Neurological: Denies dizziness, difficulty with memory, difficulty with speech or problems with balance and coordination.    No other specific complaints in a complete review of systems (except as listed in HPI above).  Objective:   Physical Exam   BP 140/72   Pulse 89   Temp 97.8 F (36.6 C) (Oral)   Wt 235 lb 8 oz (106.8 kg)   SpO2 99%   BMI 36.88 kg/m  Wt Readings from Last 3 Encounters:  01/04/18 235 lb 8 oz (106.8 kg)  12/07/17 237 lb (107.5  kg)  10/11/17 232 lb 12.8 oz (105.6 kg)    General: Appears ther stated age, obese in NAD. Skin: Warm, dry and intact. No ulcerations noted. Cardiovascular: Normal rate and rhythm. S1,S2 noted.  No murmur, rubs or gallops noted. No JVD or BLE edema. Pulmonary/Chest: Normal effort  and positive vesicular breath sounds. No respiratory distress. No wheezes, rales or ronchi noted.  Abdomen: Soft and nontender. Ventral hernia noted. Normal bowel sounds.  Neurological: Alert and oriented. Sensation intact to BLE.   BMET    Component Value Date/Time   NA 140 12/07/2017 0944   NA 141 09/21/2017 1108   K 4.3 12/07/2017 0944   K 4.1 09/21/2017 1108   CL 105 12/07/2017 0944   CL 104 12/12/2012 1031   CO2 28 12/07/2017 0944   CO2 25 09/21/2017 1108   GLUCOSE 161 (H) 12/07/2017 0944   GLUCOSE 132 09/21/2017 1108   GLUCOSE 130 (H) 12/12/2012 1031   BUN 10 12/07/2017 0944   BUN 19.7 09/21/2017 1108   CREATININE 0.99 12/07/2017 0944   CREATININE 1.1 09/21/2017 1108   CALCIUM 9.7 12/07/2017 0944   CALCIUM 8.8 09/21/2017 1108   GFRNONAA 58 (L) 12/07/2017 0944   GFRAA >60 12/07/2017 0944    Lipid Panel     Component Value Date/Time   CHOL 160 10/04/2017 1210   TRIG 203.0 (H) 10/04/2017 1210   HDL 42.50 10/04/2017 1210   CHOLHDL 4 10/04/2017 1210   VLDL 40.6 (H) 10/04/2017 1210   LDLCALC 91 09/24/2016 0902    CBC    Component Value Date/Time   WBC 6.5 12/07/2017 0944   RBC 3.06 (L) 12/07/2017 0944   HGB 10.4 (L) 12/07/2017 0944   HGB 10.0 (L) 10/11/2017 0858   HCT 31.4 (L) 12/07/2017 0944   HCT 29.6 (L) 10/11/2017 0858   PLT 357 12/07/2017 0944   PLT 336 10/11/2017 0858   MCV 102.8 (H) 12/07/2017 0944   MCV 101.4 (H) 10/11/2017 0858   MCH 34.2 (H) 12/07/2017 0944   MCHC 33.2 12/07/2017 0944   RDW 14.0 12/07/2017 0944   RDW 13.7 10/11/2017 0858   LYMPHSABS 1.7 12/07/2017 0944   LYMPHSABS 2.8 10/11/2017 0858   MONOABS 0.5 12/07/2017 0944   MONOABS 0.7 10/11/2017 0858    EOSABS 0.2 12/07/2017 0944   EOSABS 0.3 10/11/2017 0858   BASOSABS 0.0 12/07/2017 0944   BASOSABS 0.1 10/11/2017 0858    Hgb A1C Lab Results  Component Value Date   HGBA1C 8.0 (H) 10/04/2017           Assessment & Plan:   Vit D Deficiency:  Repeat Vit D today  Ventral Hernia:  Referral placed to generally surgery for further evaluation and treatment per patient request  Cough:  No indication for abx at this time eRx for Diabetic Tussin DM- use as directed  Will follow up after labs, return precautions discussed Webb Silversmith, NP

## 2018-01-04 NOTE — Patient Instructions (Signed)
DASH Eating Plan DASH stands for "Dietary Approaches to Stop Hypertension." The DASH eating plan is a healthy eating plan that has been shown to reduce high blood pressure (hypertension). It may also reduce your risk for type 2 diabetes, heart disease, and stroke. The DASH eating plan may also help with weight loss. What are tips for following this plan? General guidelines  Avoid eating more than 2,300 mg (milligrams) of salt (sodium) a day. If you have hypertension, you may need to reduce your sodium intake to 1,500 mg a day.  Limit alcohol intake to no more than 1 drink a day for nonpregnant women and 2 drinks a day for men. One drink equals 12 oz of beer, 5 oz of wine, or 1 oz of hard liquor.  Work with your health care provider to maintain a healthy body weight or to lose weight. Ask what an ideal weight is for you.  Get at least 30 minutes of exercise that causes your heart to beat faster (aerobic exercise) most days of the week. Activities may include walking, swimming, or biking.  Work with your health care provider or diet and nutrition specialist (dietitian) to adjust your eating plan to your individual calorie needs. Reading food labels  Check food labels for the amount of sodium per serving. Choose foods with less than 5 percent of the Daily Value of sodium. Generally, foods with less than 300 mg of sodium per serving fit into this eating plan.  To find whole grains, look for the word "whole" as the first word in the ingredient list. Shopping  Buy products labeled as "low-sodium" or "no salt added."  Buy fresh foods. Avoid canned foods and premade or frozen meals. Cooking  Avoid adding salt when cooking. Use salt-free seasonings or herbs instead of table salt or sea salt. Check with your health care provider or pharmacist before using salt substitutes.  Do not fry foods. Riggle foods using healthy methods such as baking, boiling, grilling, and broiling instead.  Yazdi with  heart-healthy oils, such as olive, canola, soybean, or sunflower oil. Meal planning   Eat a balanced diet that includes: ? 5 or more servings of fruits and vegetables each day. At each meal, try to fill half of your plate with fruits and vegetables. ? Up to 6-8 servings of whole grains each day. ? Less than 6 oz of lean meat, poultry, or fish each day. A 3-oz serving of meat is about the same size as a deck of cards. One egg equals 1 oz. ? 2 servings of low-fat dairy each day. ? A serving of nuts, seeds, or beans 5 times each week. ? Heart-healthy fats. Healthy fats called Omega-3 fatty acids are found in foods such as flaxseeds and coldwater fish, like sardines, salmon, and mackerel.  Limit how much you eat of the following: ? Canned or prepackaged foods. ? Food that is high in trans fat, such as fried foods. ? Food that is high in saturated fat, such as fatty meat. ? Sweets, desserts, sugary drinks, and other foods with added sugar. ? Full-fat dairy products.  Do not salt foods before eating.  Try to eat at least 2 vegetarian meals each week.  Eat more home-cooked food and less restaurant, buffet, and fast food.  When eating at a restaurant, ask that your food be prepared with less salt or no salt, if possible. What foods are recommended? The items listed may not be a complete list. Talk with your dietitian about what   dietary choices are best for you. Grains Whole-grain or whole-wheat bread. Whole-grain or whole-wheat pasta. Brown rice. Oatmeal. Quinoa. Bulgur. Whole-grain and low-sodium cereals. Pita bread. Low-fat, low-sodium crackers. Whole-wheat flour tortillas. Vegetables Fresh or frozen vegetables (raw, steamed, roasted, or grilled). Low-sodium or reduced-sodium tomato and vegetable juice. Low-sodium or reduced-sodium tomato sauce and tomato paste. Low-sodium or reduced-sodium canned vegetables. Fruits All fresh, dried, or frozen fruit. Canned fruit in natural juice (without  added sugar). Meat and other protein foods Skinless chicken or turkey. Ground chicken or turkey. Pork with fat trimmed off. Fish and seafood. Egg whites. Dried beans, peas, or lentils. Unsalted nuts, nut butters, and seeds. Unsalted canned beans. Lean cuts of beef with fat trimmed off. Low-sodium, lean deli meat. Dairy Low-fat (1%) or fat-free (skim) milk. Fat-free, low-fat, or reduced-fat cheeses. Nonfat, low-sodium ricotta or cottage cheese. Low-fat or nonfat yogurt. Low-fat, low-sodium cheese. Fats and oils Soft margarine without trans fats. Vegetable oil. Low-fat, reduced-fat, or light mayonnaise and salad dressings (reduced-sodium). Canola, safflower, olive, soybean, and sunflower oils. Avocado. Seasoning and other foods Herbs. Spices. Seasoning mixes without salt. Unsalted popcorn and pretzels. Fat-free sweets. What foods are not recommended? The items listed may not be a complete list. Talk with your dietitian about what dietary choices are best for you. Grains Baked goods made with fat, such as croissants, muffins, or some breads. Dry pasta or rice meal packs. Vegetables Creamed or fried vegetables. Vegetables in a cheese sauce. Regular canned vegetables (not low-sodium or reduced-sodium). Regular canned tomato sauce and paste (not low-sodium or reduced-sodium). Regular tomato and vegetable juice (not low-sodium or reduced-sodium). Pickles. Olives. Fruits Canned fruit in a light or heavy syrup. Fried fruit. Fruit in cream or butter sauce. Meat and other protein foods Fatty cuts of meat. Ribs. Fried meat. Bacon. Sausage. Bologna and other processed lunch meats. Salami. Fatback. Hotdogs. Bratwurst. Salted nuts and seeds. Canned beans with added salt. Canned or smoked fish. Whole eggs or egg yolks. Chicken or turkey with skin. Dairy Whole or 2% milk, cream, and half-and-half. Whole or full-fat cream cheese. Whole-fat or sweetened yogurt. Full-fat cheese. Nondairy creamers. Whipped toppings.  Processed cheese and cheese spreads. Fats and oils Butter. Stick margarine. Lard. Shortening. Ghee. Bacon fat. Tropical oils, such as coconut, palm kernel, or palm oil. Seasoning and other foods Salted popcorn and pretzels. Onion salt, garlic salt, seasoned salt, table salt, and sea salt. Worcestershire sauce. Tartar sauce. Barbecue sauce. Teriyaki sauce. Soy sauce, including reduced-sodium. Steak sauce. Canned and packaged gravies. Fish sauce. Oyster sauce. Cocktail sauce. Horseradish that you find on the shelf. Ketchup. Mustard. Meat flavorings and tenderizers. Bouillon cubes. Hot sauce and Tabasco sauce. Premade or packaged marinades. Premade or packaged taco seasonings. Relishes. Regular salad dressings. Where to find more information:  National Heart, Lung, and Blood Institute: www.nhlbi.nih.gov  American Heart Association: www.heart.org Summary  The DASH eating plan is a healthy eating plan that has been shown to reduce high blood pressure (hypertension). It may also reduce your risk for type 2 diabetes, heart disease, and stroke.  With the DASH eating plan, you should limit salt (sodium) intake to 2,300 mg a day. If you have hypertension, you may need to reduce your sodium intake to 1,500 mg a day.  When on the DASH eating plan, aim to eat more fresh fruits and vegetables, whole grains, lean proteins, low-fat dairy, and heart-healthy fats.  Work with your health care provider or diet and nutrition specialist (dietitian) to adjust your eating plan to your individual   calorie needs. This information is not intended to replace advice given to you by your health care provider. Make sure you discuss any questions you have with your health care provider. Document Released: 11/05/2011 Document Revised: 11/09/2016 Document Reviewed: 11/09/2016 Elsevier Interactive Patient Education  2018 [employer]  

## 2018-01-05 MED ORDER — SITAGLIPTIN PHOSPHATE 100 MG PO TABS: 100.0000 mg | ORAL_TABLET | Freq: Every day | ORAL | 2 refills | Status: DC

## 2018-01-05 NOTE — Addendum Note (Signed)
Addended by: Lurlean Nanny on: 01/05/2018 05:20 PM   Modules accepted: Orders

## 2018-01-21 ENCOUNTER — Ambulatory Visit: Payer: Self-pay | Admitting: Surgery

## 2018-01-21 DIAGNOSIS — K439 Ventral hernia without obstruction or gangrene: Secondary | ICD-10-CM | POA: Diagnosis not present

## 2018-01-21 NOTE — H&P (View-Only) (Signed)
History of Present Illness Lindsey Marquez. Avion Kutzer MD; 01/21/2018 12:55 PM) The patient is a 67 year old female who presents with an abdominal wall hernia. Status post left mastectomy and right port placement on 05/28/16. Pathology confirmed [id number] Stage IIA, G3 invasive ductal carcinoma. She completed chemotherapy and we removed her port on 08/19/17. Her staging CT scans in 2017 showed a supraumbilical ventral hernia containing only fat, as well as two very small inguinal hernias. The ventral hernia has become more symptomatic, so she presents now to discuss repair. She has minimal symptoms in her groin.  CLINICAL DATA: Restaging of left breast cancer diagnosed in 2003 in 2017. Mastectomy earlier this year. Chemotherapy to start. EXAM: CT CHEST, ABDOMEN, AND PELVIS WITH CONTRAST TECHNIQUE: Multidetector CT imaging of the chest, abdomen and pelvis was performed following the standard protocol during bolus administration of intravenous contrast. CONTRAST: 17mL ISOVUE-300 IOPAMIDOL (ISOVUE-300) INJECTION 61% COMPARISON: Today's bone scan, dictated separately. Breast MR 05/05/2016. Chest radiograph 05/28/2016. Report of chest abdomen and pelvic CTs of 09/12/2002. FINDINGS: CT CHEST FINDINGS Mediastinum/Lymph Nodes: No supraclavicular adenopathy. A right Port-A-Cath terminates at the high right atrium. Right axillary nodes measure up to 1.0 short axis on image 20/series 7. Left axillary node dissection. No left axillary adenopathy. No subpectoral adenopathy. Left mastectomy a with presumed postoperative fluid collection in the chest wall. 14.7 x 3.5 cm. Aortic atherosclerosis. Normal heart size, without pericardial effusion. No central pulmonary embolism, on this non-dedicated study. No mediastinal or hilar adenopathy. No internal mammary adenopathy. Lungs/Pleura: No pleural fluid. Clear lungs. Musculoskeletal: No acute osseous abnormality. CT ABDOMEN PELVIS FINDINGS Hepatobiliary:  Moderate hepatic steatosis with hepatomegaly at 21.0 cm craniocaudal. No focal liver lesion. Normal gallbladder, without biliary ductal dilatation. Pancreas: Normal, without mass or ductal dilatation. Spleen: Normal in size, without focal abnormality. Adrenals/Urinary Tract: Normal adrenal glands. Normal kidneys, without hydronephrosis. Normal urinary bladder. Stomach/Bowel: Normal stomach, without wall thickening. Normal colon, appendix, and terminal ileum. Normal small bowel. Vascular/Lymphatic: Aortic and branch vessel atherosclerosis. No abdominopelvic adenopathy. Reproductive: Central uterine hypoattenuation at 11 mm on image 103 sagittal. No adnexal mass. Other: No significant free fluid. Fat containing bilateral inguinal hernias are tiny. There is also a fat containing ventral abdominal wall hernia on image 85/series 2. Musculoskeletal: No suspicious osseous lesion. Degenerative disc disease is advanced,/at L4-5 and L5-S1. Convex right lumbar spine curvature. IMPRESSION: 1. Left mastectomy with postoperative fluid collection in the left chest wall. This most likely a postoperative seroma or hematoma. 2. Prominent right axillary nodes, as detailed on prior MRI and sampled on 05/15/2016. 3. Otherwise, no evidence of metastatic disease in the chest, abdomen, or pelvis. 4. Hepatic steatosis and hepatomegaly. 5. Aortic atherosclerosis. 6. Fat containing ventral and bilateral inguinal hernias. 7. Central uterine hypoattenuation which is abnormal for patient age. Correlate with postmenopausal bleeding. Especially if there is any such symptoms, consider pelvic ultrasound. Electronically Signed By: Abigail Miyamoto M.D. On: 06/25/2016 16:16   Problem List/Past Medical Rodman Key K. Xaniyah Buchholz, MD; 01/21/2018 12:56 PM) S/P LEFT MASTECTOMY (Z90.12) POSTOPERATIVE STATE (Z98.890) drain not ready to come out today. we reviewed how to record and indication for drain removal. she will need to  see dr Georgette Dover next week. I gave them rx for bra to provide more compression as the binder does not have straps on it and is leading to swelling above it. would benefit from pt referral once cleared VENTRAL HERNIA WITHOUT OBSTRUCTION OR GANGRENE (K43.9) RECURRENT BREAST CANCER, LEFT (C62.376)  Past Surgical History (Mishka Stegemann K. Shizue Kaseman, MD; 01/21/2018  12:56 PM) Foot Surgery Bilateral.  Diagnostic Studies History Rodman Key K. Alysse Rathe, MD; 01/21/2018 12:56 PM) Colonoscopy 1-5 years ago Mammogram within last year  Allergies [name], [place]; 01/21/2018 10:40 AM) Codeine/Codeine Derivatives Allergies Reconciled  Medication History Lindsey Marquez. Riker Collier, MD; 01/21/2018 12:56 PM) Illusions C Breast Prosthesis (1 (one)) Active. (Left Mastectomy; T7322 Post-mastectomy camisole - to hold drain tubes and wear during healing.; QTY: 2; Length of use: 3 Months 3 Refills ; L8015 Post-mastectomy camisole - to hold drain tubes and wear during healing.; QTY: 2; Length of use: 3 Months 3 Refills ; L8020Non-silicone breast prosthesis - for temporary use during healing or for comfort at end of day, lighter weight, cooler for hot weather.; QTY: 1 for single, 2 for bilateral ; Length of use: 6 months Up to 1 refill; Replacement ; G2542 Silicone Breast Prosthesis - to restore balance and symmetry after breast surgery; QTY: 1 for single, 2 for Bilateral; Length of use: 2 years No refills) GlyBURIDE-MetFORMIN (2.5-500MG  Tablet, Oral) Active. MetFORMIN HCl (500MG  Tablet, Oral) Active. Lisinopril-Hydrochlorothiazide (20-25MG  Tablet, Oral) Active. Aspirin (81MG  Tablet DR, Oral) Active. Ibuprofen (200MG  Capsule, Oral) Active. Fish Oil (300MG  Capsule, Oral) Active. Vitamin D3 (1000UNIT Capsule, Oral) Active. Klor-Con M20 [facility] Tablet ER, Oral) Active. Letrozole (2.5MG  Tablet, Oral) Active. Nerlynx (40MG  Tablet, Oral) Active. Torsemide (20MG  Tablet, Oral) Active. Medications Reconciled TraMADol HCl (50MG   Tablet, 2 (two) Tablet Oral every six hours, as needed for pain, Taken starting 06/05/2016) Active.  [name]. Kingslee Dowse, MD; 01/21/2018 12:56 PM) No alcohol use Tobacco use Never smoker.  Family History Lindsey Marquez. Breeann Reposa, MD; 01/21/2018 12:56 PM) Hypertension Father. Malignant Neoplasm Of Pancreas Mother.  Pregnancy / Birth History Lindsey Marquez. Rande Dario, MD; 01/21/2018 12:56 PM) Age of menopause 56-60 Irregular periods Maternal age 71-25 Para 2  Other Problems Lindsey Marquez. Shantal Roan, MD; 01/21/2018 12:56 PM) Breast Cancer High blood pressure    Vitals Mardene Celeste King RMA; 01/21/2018 10:40 AM) 01/21/2018 10:40 AM Weight: 239 lb Height: 67in Body Surface Area: 2.18 m Body Mass Index: 37.43 kg/m  Temp.: 98.73F  Pulse: 122 (Regular)  BP: 160/90 (Sitting, Left Arm, Standard)      Physical Exam Rodman Key K. Avika Carbine MD; 01/21/2018 12:55 PM)  The physical exam findings are as follows: Note:WDWN in NAD Eyes: Pupils equal, round; sclera anicteric HENT: Oral mucosa moist; good dentition Neck: No masses palpated, no thyromegaly Lungs: CTA bilaterally; normal respiratory effort CV: Regular rate and rhythm; no murmurs; extremities well-perfused with no edema Abd: +bowel sounds, soft, non-tender, no palpable organomegaly; protruding ventral hernia above umbilicus - mostly reducible when supine; mildly tender GU: mild tenderness in both groins, but no obvious palpable inguinal hernias Skin: Warm, dry; no sign of jaundice Psychiatric - alert and oriented x 4; calm mood and affect    Assessment & Plan Rodman Key K. Trust Leh MD; 01/21/2018 12:56 PM)  VENTRAL HERNIA WITHOUT OBSTRUCTION OR GANGRENE (K43.9)  Current Plans Schedule for Surgery - Ventral hernia repair with mesh. The surgical procedure has been discussed with the patient. Potential risks, benefits, alternative treatments, and expected outcomes have been explained. All of the patient's questions at this  time have been answered. The likelihood of reaching the patient's treatment goal is good. The patient understand the proposed surgical procedure and wishes to proceed. Note:We will address the inguinal hernias later if they become more symptomatic.  Lindsey Marquez. Georgette Dover, MD, [facility] Surgery  General/ Trauma Surgery  01/21/2018 12:56 PM

## 2018-01-21 NOTE — H&P (Signed)
History of Present Illness Lindsey Marquez. Lindsey Froman MD; 01/21/2018 12:55 PM) The patient is a 67 year old female who presents with an abdominal wall hernia. Status post left mastectomy and right port placement on 05/28/16. Pathology confirmed [id number] Stage IIA, G3 invasive ductal carcinoma. She completed chemotherapy and we removed her port on 08/19/17. Her staging CT scans in 2017 showed a supraumbilical ventral hernia containing only fat, as well as two very small inguinal hernias. The ventral hernia has become more symptomatic, so she presents now to discuss repair. She has minimal symptoms in her groin.  CLINICAL DATA: Restaging of left breast cancer diagnosed in 2003 in 2017. Mastectomy earlier this year. Chemotherapy to start. EXAM: CT CHEST, ABDOMEN, AND PELVIS WITH CONTRAST TECHNIQUE: Multidetector CT imaging of the chest, abdomen and pelvis was performed following the standard protocol during bolus administration of intravenous contrast. CONTRAST: 119mL ISOVUE-300 IOPAMIDOL (ISOVUE-300) INJECTION 61% COMPARISON: Today's bone scan, dictated separately. Breast MR 05/05/2016. Chest radiograph 05/28/2016. Report of chest abdomen and pelvic CTs of 09/12/2002. FINDINGS: CT CHEST FINDINGS Mediastinum/Lymph Nodes: No supraclavicular adenopathy. A right Port-A-Cath terminates at the high right atrium. Right axillary nodes measure up to 1.0 short axis on image 20/series 7. Left axillary node dissection. No left axillary adenopathy. No subpectoral adenopathy. Left mastectomy a with presumed postoperative fluid collection in the chest wall. 14.7 x 3.5 cm. Aortic atherosclerosis. Normal heart size, without pericardial effusion. No central pulmonary embolism, on this non-dedicated study. No mediastinal or hilar adenopathy. No internal mammary adenopathy. Lungs/Pleura: No pleural fluid. Clear lungs. Musculoskeletal: No acute osseous abnormality. CT ABDOMEN PELVIS FINDINGS Hepatobiliary:  Moderate hepatic steatosis with hepatomegaly at 21.0 cm craniocaudal. No focal liver lesion. Normal gallbladder, without biliary ductal dilatation. Pancreas: Normal, without mass or ductal dilatation. Spleen: Normal in size, without focal abnormality. Adrenals/Urinary Tract: Normal adrenal glands. Normal kidneys, without hydronephrosis. Normal urinary bladder. Stomach/Bowel: Normal stomach, without wall thickening. Normal colon, appendix, and terminal ileum. Normal small bowel. Vascular/Lymphatic: Aortic and branch vessel atherosclerosis. No abdominopelvic adenopathy. Reproductive: Central uterine hypoattenuation at 11 mm on image 103 sagittal. No adnexal mass. Other: No significant free fluid. Fat containing bilateral inguinal hernias are tiny. There is also a fat containing ventral abdominal wall hernia on image 85/series 2. Musculoskeletal: No suspicious osseous lesion. Degenerative disc disease is advanced,/at L4-5 and L5-S1. Convex right lumbar spine curvature. IMPRESSION: 1. Left mastectomy with postoperative fluid collection in the left chest wall. This most likely a postoperative seroma or hematoma. 2. Prominent right axillary nodes, as detailed on prior MRI and sampled on 05/15/2016. 3. Otherwise, no evidence of metastatic disease in the chest, abdomen, or pelvis. 4. Hepatic steatosis and hepatomegaly. 5. Aortic atherosclerosis. 6. Fat containing ventral and bilateral inguinal hernias. 7. Central uterine hypoattenuation which is abnormal for patient age. Correlate with postmenopausal bleeding. Especially if there is any such symptoms, consider pelvic ultrasound. Electronically Signed By: Abigail Miyamoto M.D. On: 06/25/2016 16:16   Problem List/Past Medical Lindsey Key K. Kylo Gavin, MD; 01/21/2018 12:56 PM) S/P LEFT MASTECTOMY (Z90.12) POSTOPERATIVE STATE (Z98.890) drain not ready to come out today. we reviewed how to record and indication for drain removal. she will need to  see dr Georgette Dover next week. I gave them rx for bra to provide more compression as the binder does not have straps on it and is leading to swelling above it. would benefit from pt referral once cleared VENTRAL HERNIA WITHOUT OBSTRUCTION OR GANGRENE (K43.9) RECURRENT BREAST CANCER, LEFT (W96.045)  Past Surgical History (Lindsey Roediger K. Hollyn Stucky, MD; 01/21/2018  12:56 PM) Foot Surgery Bilateral.  Diagnostic Studies History Lindsey Key K. Asa Fath, MD; 01/21/2018 12:56 PM) Colonoscopy 1-5 years ago Mammogram within last year  Allergies Lindsey Marquez, [place]; 01/21/2018 10:40 AM) Codeine/Codeine Derivatives Allergies Reconciled  Medication History Lindsey Marquez. Maxtyn Nuzum, MD; 01/21/2018 12:56 PM) Illusions C Breast Prosthesis (1 (one)) Active. (Left Mastectomy; N5621 Post-mastectomy camisole - to hold drain tubes and wear during healing.; QTY: 2; Length of use: 3 Months 3 Refills ; L8015 Post-mastectomy camisole - to hold drain tubes and wear during healing.; QTY: 2; Length of use: 3 Months 3 Refills ; L8020Non-silicone breast prosthesis - for temporary use during healing or for comfort at end of day, lighter weight, cooler for hot weather.; QTY: 1 for single, 2 for bilateral ; Length of use: 6 months Up to 1 refill; Replacement ; H0865 Silicone Breast Prosthesis - to restore balance and symmetry after breast surgery; QTY: 1 for single, 2 for Bilateral; Length of use: 2 years No refills) GlyBURIDE-MetFORMIN (2.5-500MG  Tablet, Oral) Active. MetFORMIN HCl (500MG  Tablet, Oral) Active. Lisinopril-Hydrochlorothiazide (20-25MG  Tablet, Oral) Active. Aspirin (81MG  Tablet DR, Oral) Active. Ibuprofen (200MG  Capsule, Oral) Active. Fish Oil (300MG  Capsule, Oral) Active. Vitamin D3 (1000UNIT Capsule, Oral) Active. Klor-Con M20 [facility] Tablet ER, Oral) Active. Letrozole (2.5MG  Tablet, Oral) Active. Nerlynx (40MG  Tablet, Oral) Active. Torsemide (20MG  Tablet, Oral) Active. Medications Reconciled TraMADol HCl (50MG   Tablet, 2 (two) Tablet Oral every six hours, as needed for pain, Taken starting 06/05/2016) Active.  [name]. Aydn Ferrara, MD; 01/21/2018 12:56 PM) No alcohol use Tobacco use Never smoker.  Family History Lindsey Marquez. Claudetta Sallie, MD; 01/21/2018 12:56 PM) Hypertension Father. Malignant Neoplasm Of Pancreas Mother.  Pregnancy / Birth History Lindsey Marquez. Kyann Heydt, MD; 01/21/2018 12:56 PM) Age of menopause 56-60 Irregular periods Maternal age 36-25 Para 2  Other Problems Lindsey Marquez. Rashelle Ireland, MD; 01/21/2018 12:56 PM) Breast Cancer High blood pressure    Vitals Mardene Celeste King RMA; 01/21/2018 10:40 AM) 01/21/2018 10:40 AM Weight: 239 lb Height: 67in Body Surface Area: 2.18 m Body Mass Index: 37.43 kg/m  Temp.: 98.60F  Pulse: 122 (Regular)  BP: 160/90 (Sitting, Left Arm, Standard)      Physical Exam Lindsey Key K. Trezure Cronk MD; 01/21/2018 12:55 PM)  The physical exam findings are as follows: Note:WDWN in NAD Eyes: Pupils equal, round; sclera anicteric HENT: Oral mucosa moist; good dentition Neck: No masses palpated, no thyromegaly Lungs: CTA bilaterally; normal respiratory effort CV: Regular rate and rhythm; no murmurs; extremities well-perfused with no edema Abd: +bowel sounds, soft, non-tender, no palpable organomegaly; protruding ventral hernia above umbilicus - mostly reducible when supine; mildly tender GU: mild tenderness in both groins, but no obvious palpable inguinal hernias Skin: Warm, dry; no sign of jaundice Psychiatric - alert and oriented x 4; calm mood and affect    Assessment & Plan Lindsey Key K. Averyana Pillars MD; 01/21/2018 12:56 PM)  VENTRAL HERNIA WITHOUT OBSTRUCTION OR GANGRENE (K43.9)  Current Plans Schedule for Surgery - Ventral hernia repair with mesh. The surgical procedure has been discussed with the patient. Potential risks, benefits, alternative treatments, and expected outcomes have been explained. All of the patient's questions at this  time have been answered. The likelihood of reaching the patient's treatment goal is good. The patient understand the proposed surgical procedure and wishes to proceed. Note:We will address the inguinal hernias later if they become more symptomatic.  Lindsey Marquez. Georgette Dover, MD, [facility]  General/ Trauma Surgery  01/21/2018 12:56 PM

## 2018-01-26 ENCOUNTER — Encounter: Payer: Self-pay | Admitting: *Deleted

## 2018-01-27 ENCOUNTER — Other Ambulatory Visit: Payer: Self-pay | Admitting: Hematology

## 2018-01-27 DIAGNOSIS — C50112 Malignant neoplasm of central portion of left female breast: Secondary | ICD-10-CM

## 2018-01-27 MED ORDER — NERATINIB MALEATE 40 MG PO TABS
ORAL_TABLET | ORAL | 2 refills | Status: DC
Start: 2018-01-27 — End: 2018-04-26

## 2018-02-01 NOTE — Progress Notes (Signed)
[place]  [phone] [phone]  Clinic Follow Up Note   Patient Care Team: [name], NP as PCP - General (Internal Medicine) [name], MD as Consulting Physician (General Surgery) [name], MD as Consulting Physician (Hematology) Delice Bison Charlestine Massed, NP as Nurse Practitioner (Hematology and Oncology)   Date of Service:  02/02/2018   CHIEF COMPLAINTS:  Follow up recurrent left breast cancer   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 [facility])   Staging form: Breast, AJCC 7th Edition     Clinical stage from 04/17/2016: Stage IA (T1c, N0, M0) - Signed by [name], MD on 05/07/2016     Pathologic stage from 05/28/2016: Stage IIA (T2, N0, cM0) - Signed by [name], MD on 06/11/2016 History of left breast cancer   Staging form: Breast, AJCC 7th Edition     Clinical: Stage IIIB (T4d, N1, cM0) - Signed by [name], MD on 12/14/2013     Pathologic: Stage IIIB (T4d, N1a, cM0) - Signed by [name], MD on 12/14/2013       History of left breast cancer (Resolved)   09/12/2002 Imaging    CT scan show no evidence of disease apart from the left axilla      09/2002 -  Neo-Adjuvant Chemotherapy    TAC X4 cycles       01/10/2003 Surgery    The patient had left lumpectomy and axillary lymph node dissection which show residual breast cancer and a sentinel lymph node was positive      2004 -  Adjuvant Chemotherapy    Cytoxan with 5-FU x4 cycles (methotrexate added at cycle 4).      2004 -  Radiation Therapy    Adjuvant left breast radiation      12/2002 Receptors her2    ER, PR and HER-2 were all negative.       Cancer of central portion of left breast ([place])   04/13/2016 Mammogram    Scheduler coarse heterogeneous calcifications spanning an area of 3.7 cm in the lumpectomy site, indeterminate. Ultrasound of the left axilla was negative.      04/17/2016 Initial Diagnosis    Cancer of central portion of left  breast ([place])      04/17/2016 Initial Biopsy    Left breast core needle biopsy showed invasive and in situ ductal carcinoma with calcification, grade 2.      04/17/2016 Receptors her2    ER 100% positive, PR 15% positive, strong staining, Ki-67 10%, HER-2 positive by IHC (3)      05/05/2016 Imaging    Bilateral breast MRI showed a 1.3 x 0.8 x 0.7 cm biopsy-proven ductal carcinoma in the region of the previous lumpectomy. Enlarged lobulated right inferior axillary lymph node with diffuse cortical thickening, biopsy is recommended.      05/15/2016 Pathology Results    right axilary node biopsy was negative for malignant cell       05/28/2016 Surgery    Simple left mastectomy      05/28/2016 Pathology Results    Left mastectomy showed invasive ductal carcinoma, grade 3, 3.6 cm, high grade DCIS, (+) LVI, surgical margins were negative. Tumor 3.6 cm, no lymph nodes identified.      06/25/2016 Imaging    Staging CT scan of the chest, abdomen and pelvis with contrast and a bone scan showed no evidence of metastasis. Postoperative fluid collection in the left breast, likely a seroma or hematoma. Right axillary adenopathy,  which was biopsied previously.       07/07/2016 - 07/15/2017 Chemotherapy    Adjuvant chemotherapy with docetaxel, carboplatin, Herceptin and pejeta every 3 weeks, for 6 cycles, followed by Herceptin maintenance therapy for total of one year treatment  Ended [facility] 11/04/16        12/15/2016 -  Anti-estrogen oral therapy    Adjuvant letrozole 2.5 mg once daily      04/29/2017 Mammogram     Mammogram 04/29/2017 IMPRESSION: No mammographic evidence of malignancy. A result letter of this screening mammogram will be mailed directly to the patient.       09/22/2017 Procedure    Colonoscopy by Dr. Silverio Decamp  IMPRESSION: - One 3 mm polyp in the transverse colon, removed with a cold biopsy forceps. Resected and retrieved. - Non-bleeding internal hemorrhoids.      09/22/2017  Pathology Results    Diagnosis, colonoscopy  Surgical [P], transverse, polyp - TUBULAR ADENOMA(S). - HIGH GRADE DYSPLASIA IS NOT IDENTIFIED.         HISTORY OF PRESENTING ILLNESS: 05/08/16 Lindsey Marquez 67 y.o. female is here because of her recurrent left breast cancer.  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 She was seen by multiple medical oncologist in our practice in the past, last was seen by Dr. Alvy Bimler in 11/2013.  She has been doing well, and is compliant with annual screening mammogram. The screening mammogram on 04/17/2016 showed calcification in the left breast, and core needle biopsy showed invasive and in situ ductal carcinoma, grade 2, ER/PR positive, HER-2 amplified. She was referred to seeing breast surgeon Dr. Georgette Dover and referred back to [place].   She feels well, she did not feel any lump in her breast or axillas lately. She denies any pain, dyspnea, or GI symptoms. She had right nipple rash and right axillary swelling in the past one week, after she drinks some diet tea with citric. Breast MRI showed an enlarged lobulated right inferior axillary lymph node with diffuse cortical thickening, in addition to the known left breast mass which measured 1.3 cm on MRI.  GYN HISTORY  Menarchal: 12 LMP: 2003 Contraceptive: 20 HRT: n/a  G2P2: 40 yo Son and 108 yo son who died   CURRENT THERAPY:  Letrozole 2.5 mg daily, started on 12/15/2016, Nerlynx to start 08/26/17 approximately, held on 09/14/17 due to significant side effects, restarted on week of 09/21/17   INTERIM HISTORY:  Ms. folden returns for follow up for her left breast cancer. Since her last visit she attended survivorship clinic with NP Thedore Mins on 12/07/17. Pt plans to have open ventral hernia repair with Dr. Prince Solian on 02/17/18.   She presents to the clinic today she is still taking  Nerlynx and letrozole. She tries to drink 4 large bottles of water a day. She has not been taking Vitamin D.   On review of symptoms, pt notes her diarrhea has much improved. She notes her mouth will occasionally be dry. She barely has hot flashes with letrozole She will have joint pain and muscles cramps depending on the weather.     MEDICAL HISTORY:  Past Medical History:  Diagnosis Date  . Blood transfusion without reported diagnosis   . Breast cancer [facility]) August 2002   Invasive ductal carcinoma Left breast. 2002, 2017  . Diabetes mellitus without complication ([place])   . Family history of malignant neoplasm of ovary   .  Family history of pancreatic cancer   . Hypertension     SURGICAL HISTORY: Past Surgical History:  Procedure Laterality Date  . BREAST SURGERY Left 2003  . COLONOSCOPY    . FOOT SURGERY  2000  . MASTECTOMY Left 05/28/2016  . port a cath insertion  05/28/2016  . PORT-A-CATH REMOVAL Right 08/19/2017   Procedure: REMOVAL PORT-A-CATH;  Surgeon: [name], MD;  Location: [facility];  Service: General;  Laterality: Right;  . PORTACATH PLACEMENT Right 05/28/2016   Procedure: INSERTION PORT-A-CATH;  Surgeon: [name], MD;  Location: [facility];  Service: General;  Laterality: Right;  . TOTAL MASTECTOMY Left 05/28/2016   Procedure: LEFT  MASTECTOMY;  Surgeon: [name], MD;  Location: [facility];  Service: General;  Laterality: Left;  . TUBAL LIGATION  22 yrs since  2004.   Bilateral.    SOCIAL HISTORY: Social History   Socioeconomic History  . Marital status: Divorced    Spouse name: Not on file  . Number of children: Not on file  . Years of education: Not on file  . Highest education level: Not on file  Social Needs  . Financial resource strain: Not on file  . Food insecurity - worry: Not on file  . Food insecurity - inability: Not on file  . Transportation needs - medical: Not on file  . Transportation needs - non-medical: Not on file    Occupational History  . Not on file  Tobacco Use  . Smoking status: Former Smoker    Packs/day: 0.10    Years: 7.00    Pack years: 0.70    Last attempt to quit: 12/01/1975    Years since quitting: 42.2  . Smokeless tobacco: Never Used  Substance and Sexual Activity  . Alcohol use: No    Alcohol/week: 0.0 oz  . Drug use: No  . Sexual activity: Yes  Other Topics Concern  . Not on file  Social History Narrative  . Not on file   She is retired from school   FAMILY HISTORY: Family History  Problem Relation Age of Onset  . Prostate cancer Father   . Ovarian cancer Maternal Aunt 62       deceased  . Pancreatic cancer Mother 26       deceased  . Cancer Maternal Aunt        2 other mat aunts; unk. primary in 8s; deceased  . Cancer Maternal Uncle        "bone ca"; unk. primary; deceased 28s  . Prostate cancer Maternal Uncle        deceased 10  . Colon cancer Neg Hx   . Esophageal cancer Neg Hx   . Rectal cancer Neg Hx   . Stomach cancer Neg Hx     ALLERGIES:  is allergic to codeine.  MEDICATIONS:  Current Outpatient Medications  Medication Sig Dispense Refill  . aspirin 81 MG tablet Take 81 mg by mouth daily.     Marland Kitchen dextromethorphan-guaiFENesin (DIABETIC TUSSIN DM) 10-100 MG/5ML liquid Take 5 mLs by mouth every 4 (four) hours as needed for cough. 180 mL 0  . glucose blood (TRUE METRIX BLOOD GLUCOSE TEST) test strip 1 each by Other route 3 (three) times daily. Use as instructed 600 each 2  . glyBURIDE-metformin (GLUCOVANCE) 2.5-500 MG tablet TAKE 2 TABLETS BY MOUTH 2 (TWO) TIMES DAILY WITH A MEAL. 360 tablet 1  . ibuprofen (ADVIL,MOTRIN) 200 MG tablet Take 200 mg by mouth every 8 (eight) hours as  needed.      Marland Kitchen letrozole (FEMARA) 2.5 MG tablet Take 1 tablet (2.5 mg total) by mouth daily. 30 tablet 3  . lisinopril-hydrochlorothiazide (PRINZIDE,ZESTORETIC) 20-25 MG tablet Take 1 tablet daily by mouth. 90 tablet 1  . Neratinib Maleate (NERLYNX) 40 MG tablet TAKE 6 TABLETS  (240MG) BY MOUTH ONE TIME DAILY WITH FOOD. 180 tablet 2  . Omega-3 Fatty Acids (FISH OIL) 1200 MG CAPS Take 1 capsule by mouth daily.    . ondansetron (ZOFRAN-ODT) 4 MG disintegrating tablet Take 1 tablet (4 mg total) by mouth every 8 (eight) hours as needed for nausea or vomiting. 20 tablet 1  . Potassium Chloride ER 20 MEQ TBCR Take 40 mEq daily by mouth. 60 tablet 2  . sitaGLIPtin (JANUVIA) 100 MG tablet Take 1 tablet (100 mg total) by mouth daily. 30 tablet 2  . torsemide (DEMADEX) 20 MG tablet Take 20 mg by mouth daily.    . TRUEPLUS LANCETS 33G MISC 1 each by Does not apply route 3 (three) times daily. 600 each 2   No current facility-administered medications for this visit.     REVIEW OF SYSTEMS:   Constitutional: Denies fevers, chills or abnormal night sweats Eyes: Denies blurriness of vision, double vision or watery eyes Ears, nose, mouth, throat, and face: Denies mucositis or sore throat (+) occasional dry mouth Respiratory: Denies cough, dyspnea or wheezes Cardiovascular: Denies palpitation, chest discomfort Gastrointestinal:  Denies nausea, heartburn (+) controlled diarrhea  Skin: Denies abnormal skin rashes  Lymphatics: Denies new lymphadenopathy or easy bruising Neurological:Denies numbness  MSK: (+) cramping in leg (+) joint aches  Behavioral/Psych: Mood is stable, no new changes  All other systems were reviewed with the patient and are negative.  PHYSICAL EXAMINATION:  ECOG PERFORMANCE STATUS: 1  Vitals:   02/02/18 1105  BP: (!) 148/60  Pulse: 97  Resp: 16  Temp: 97.7 F (36.5 C)  SpO2: 100%   Filed Weights   02/02/18 1105  Weight: 235 lb 6.4 oz (106.8 kg)    GENERAL:alert, no distress and comfortable SKIN: skin color, texture, turgor are normal, no rashes or significant lesions EYES: normal, conjunctiva are pink and non-injected, sclera clear OROPHARYNX:no exudate, no erythema and lips, buccal mucosa, and tongue normal  NECK: supple, thyroid normal size,  non-tender, without nodularity LYMPH:  no palpable lymphadenopathy in the cervical, axillary or inguinal LUNGS: clear to auscultation and percussion with normal breathing effort HEART: regular rate & rhythm and no murmurs  ABDOMEN:abdomen soft, non-tender and normal bowel sounds Musculoskeletal:no cyanosis of digits and no clubbing  PSYCH: alert & oriented x 3 with fluent speech NEURO: no focal motor/sensory deficits,  Breasts: Breast inspection showed left breast is surgically absent, surgical incision has healed well with mild swelling below scar tissue. Exam of the right breast and bilateral axillas reviewed no palpable mass or adenopathy.  LABORATORY DATA:  I have reviewed the data as listed CBC Latest Ref Rng & Units 02/02/2018 12/07/2017 10/11/2017  WBC 3.9 - 10.3 K/uL 8.2 6.5 9.2  Hemoglobin 11.6 - 15.9 g/dL 10.9(L) 10.4(L) 10.0(L)  Hematocrit 34.8 - 46.6 % 32.2(L) 31.4(L) 29.6(L)  Platelets 145 - 400 K/uL 309 357 336   CMP Latest Ref Rng & Units 02/02/2018 01/04/2018 12/07/2017  Glucose 70 - 140 mg/dL 106 161(H) 161(H)  BUN 7 - 26 mg/dL 35(H) 19 10  Creatinine 0.60 - 1.10 mg/dL 1.50(H) 0.99 0.99  Sodium 136 - 145 mmol/L 138 139 140  Potassium 3.5 - 5.1 mmol/L 4.8 3.8  4.3  Chloride 98 - 109 mmol/L 104 105 105  CO2 22 - 29 mmol/L 24 27 28   Calcium 8.4 - 10.4 mg/dL 9.8 9.2 9.7  Total Protein 6.4 - 8.3 g/dL 8.0 - 7.9  Total Bilirubin 0.2 - 1.2 mg/dL 0.4 - 0.3  Alkaline Phos 40 - 150 U/L 77 - 81  AST 5 - 34 U/L 27 - 29  ALT 0 - 55 U/L 28 - 31    PATHOLOGY REPORT  Diagnosis 04/17/2016 Breast, left, needle core biopsy, inner mid breast - INVASIVE DUCTAL CARCINOMA WITH CALCIFICATIONS, SEE COMMENT. - DUCTAL CARCINOMA IN SITU WITH COMEDONECROSIS. Microscopic Comment While grading is best performed on the resection specimen the invasive carcinoma appears grade 2. Prognostic markers will be ordered and reported in an addendum. Dr. Lyndon Code has reviewed the case. The case was called to [facility] on 04/20/2016. Results: HER2 - *EQUIVOCAL* (Her2 by IHC will be ordered.) RATIO OF HER2/CEP17 SIGNALS 1.73 AVERAGE HER2 COPY NUMBER PER CELL 4.15 By immunohistochemistry, the tumor cells are positive for Her2 (3). Results: IMMUNOHISTOCHEMICAL AND MORPHOMETRIC ANALYSIS PERFORMED MANUALLY Estrogen Receptor: 100%, POSITIVE, STRONG STAINING INTENSITY Progesterone Receptor: 15%, POSITIVE, STRONG STAINING INTENSITY Proliferation Marker Ki67: 10%  Diagnosis 05/15/2016 Lymph node, needle/core biopsy, right axilla - BENIGN REACTIVE LYMPH NODE. - NO GRANULOMAS OR MALIGNANCY IDENTIFIED. - SEE COMMENT. Microscopic Comment Internal departmental review obtained (Dr. Lyndon Code) with agreement. Results are phoned to [place] (05/18/16). (MEG:gt, 05/18/16)  Breast, simple mastectomy, Left 05/28/2016 - INVASIVE DUCTAL CARCINOMA, GRADE III/III, SPANNING 3.6 CM. - DUCTAL CARCINOMA IN SITU, HIGH GRADE. - LYMPHOVASCULAR INVASION IS IDENTIFIED. - THE SURGICAL RESECTION MARGINS ARE NEGATIVE FOR CARCINOMA. - SEE ONCOLOGY TABLE BELOW. Microscopic Comment BREAST, INVASIVE TUMOR, WITHOUT LYMPH NODES PRESENT Specimen, including laterality : Left breast Procedure: Simple mastectomy Histologic type: Ductal with micropapillary features Grade: III Tubule formation: 3 Nuclear pleomorphism: 2 Mitotic: 3 Tumor size (gross measurement): 3.6 cm Margins: Greater than 0.2 cm to all margins Lymphovascular invasion: Present Ductal carcinoma in situ: Present Grade: High grade Extensive intraductal component: Yes Lobular neoplasia: Not identified Tumor focality: Unifocal Treatment effect: N/A Extent of tumor: Confined to breast parenchyma. Breast prognostic profile: [phone] Estrogen receptor: 100%, strong staining intensity Progesterone receptor: 15%, strong staining intensity Her 2 neu: Amplification was detected by immunohistochemistry. Ki-67: 10% 1 of  2 FINAL for TINEA, NOBILE ([id number]) Microscopic Comment(continued) Non-neoplastic breast: No significant findings. TNM: pT2, pNX (clinically recurrent). Comments: In addition to the main tumor, there is ductal carcinoma in situ present in random tissue submitted from the inferior lateral quadrant. (JBK:kh 06-01-16)  PROCEDURES  Colonoscopy 09/22/17 IMPRESSION: - One 3 mm polyp in the transverse colon, removed with a cold biopsy forceps. Resected and retrieved. - Non-bleeding internal hemorrhoids.   ECHO 07/01/17 Study Conclusions - Left ventricle: The cavity size was normal. Systolic function was   normal. The estimated ejection fraction was in the range of 55%   to 60%. Wall motion was normal; there were no regional wall   motion abnormalities. Doppler parameters are consistent with   abnormal left ventricular relaxation (grade 1 diastolic   dysfunction). - Aortic valve: Transvalvular velocity was within the normal range.   There was no stenosis. There was no regurgitation. - Mitral valve: Calcified annulus. Transvalvular velocity was   within the normal range. There was no evidence for stenosis.   There was no regurgitation. - Left atrium: The atrium was mildly dilated. - Atrial septum: The  septum bowed from left to right, consistent   with increased left atrial pressure. - Tricuspid valve: There was mild regurgitation. - Pulmonary arteries: Systolic pressure was within the normal   range. PA peak pressure: 24 mm Hg (S). - Global longitudinal strain -20.5% (normal).   RADIOGRAPHIC STUDIES: I have personally reviewed the radiological images as listed and agreed with the findings in the report. No results found.   Bone Density 04/29/2017 DualFemur Neck Right 04/29/2017    65.9         -0.8    0.928 g/cm2 AP Spine  L1-L4      04/29/2017    65.9         1.7     1.406 g/cm2  Mammogram 04/29/2017 IMPRESSION: No mammographic evidence of malignancy. A result letter of  this screening mammogram will be mailed directly to the patient.  DG Chest 2 View 10/28/2016 IMPRESSION: No acute cardiopulmonary abnormality.  ASSESSMENT & PLAN:  67 y.o. 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  1. Cancer of central portion of left breast, pT2NxM0, stage IIA, G3, triple positive -I previously reviewed her surgical pathology findings in great details with patient and her family member. It showed a 3.6 cm mass, grade 3, surgical margins were negative. She had no sentinel lymph node biopsy due to her prior history of axillary lymph node dissection. -Her restaging CT scan of bone scan showed no evidence of distant metastasis, I reviewed with patient. -We previously  discussed the risk of cancer recurrence of this complete surgical resection. We reviewed that HER-2 positive with cancers are more aggressive, with increased risk of metastasis.  -she has completed adjuvant TCHP (docetaxel, carbotaxol, Herceptin, pejeta), and one year Herceptin therapy - her last echo was 06/2017, EF normal, stable -She had colonoscopy on 09/22/17 which showed benign polyps that were removed and internal hemorrhoids.  -She started Nerlynx 221m on 08/26/17, was held on 09/14/17 due to significant diarrhea and restarted when her diarrhea resolved on 09/21/17.  She is tolerating well overall, now has controlled diarrhea with Imodium and Lomotil as needed -She is clinically doing well. Lab reviewed, she has mild anemia, and her AKI has returned. Otherwise labs WNL. Her physical exam and her 03/2017 mammogram were unremarkable. There is no clinical concern for recurrence. -Next mammogram 03/2018 -continue Nerlynx 240 mg and Letrozole daily, refilled letrozole today  -F/u in 3 months   2. Anemia  -Secondary to chemotherapy  -Her H/H was normal before chemo  -Will continue to  monitor, stable at 10.9 today     3. DM and HTN  -She'll continue follow-up with her primary care physician -We again discussed her blood pressure and glucose level may be impacted by chemotherapy, and dexamethasone she takes before and after chemotherapy. -previously I strongly encouraged her to monitor her blood pressure and glucose at home, and consider increasing the dose of metformin and glyburide if needed -her BP and blood glucose has been normal/well controlled lately -A1c improved to 7.4 as of 01/04/18 labs  4. Obesity  -I previously encouraged her to eat healthy and exercise regularly, she is willing to lose some weight after she completes chemotherapy.  5. Bone Health -We previously discussed that Letrozole can cause some bone weakening  -She previously has had a bone density scan in the past, but it was several years ago -repeated DEXA scan in 03/2017 showed normal bone density  -  I encouraged her to take calcium and vitamin D. We'll monitor her Vit D levels. -Her Bone density scan from 04/29/17 her Femur Neck Right T-score is -0.8. This patient is considered normal. -Will repeat DEXA in 2020    6. Diarrhea  -Secondary to Neratinib  -Which improved now, she will continue use Imodium as needed  7. AKI -Her creatinine went up to 1.6 on 09/08/17, it was within normal one month ago. -This is likely secondary to her diarrhea from medication -BUN at 35 and CR at 1.50 today (02/02/18). She has been taking diuretics as needed for her leg swelling daily.  -She currently drinks adequate amount of water a day.  -I advised her to hold Demadex for a week, then reduce dose. Will  recheck labs next month  -I encouraged her to elevate her feet when sitting   8. Joint pain and leg cramping  -secondary to letrozole  -I again suggest she take her high dose Vitamin D and calcium  -Overall this is manageable for her.    Plan -Continue Nerlynx, she knows to take Imodium and Lomotil as needed  for diarrhea -Continue Letrozole, refilled today  -Lab and f/u in 3 month  -Lab in one month for monitor her renal function. She will decrease demadex  -Mammogram in 03/2018    All questions were answered. The patient knows to call the clinic with any problems, questions or concerns.  I spent 25 minutes counseling the patient face to face. The total time spent in the appointment was  30 minutes and more than 50% was on counseling.  This document serves as a record of services personally performed by [name], MD. It was created on her behalf by Joslyn Devon, a trained medical scribe. The creation of this record is based on the scribe's personal observations and the provider's statements to them.    I have reviewed the above documentation for accuracy and completeness, and I agree with the above.    [name], MD 02/02/2018

## 2018-02-02 ENCOUNTER — Inpatient Hospital Stay: Payer: Medicare HMO | Attending: Adult Health

## 2018-02-02 ENCOUNTER — Inpatient Hospital Stay (HOSPITAL_BASED_OUTPATIENT_CLINIC_OR_DEPARTMENT_OTHER): Payer: Medicare HMO | Admitting: Hematology

## 2018-02-02 ENCOUNTER — Telehealth: Payer: Self-pay | Admitting: Hematology

## 2018-02-02 VITALS — BP 148/60 | HR 97 | Temp 97.7°F | Resp 16 | Ht 67.0 in | Wt 235.4 lb

## 2018-02-02 DIAGNOSIS — N179 Acute kidney failure, unspecified: Secondary | ICD-10-CM | POA: Insufficient documentation

## 2018-02-02 DIAGNOSIS — Z7982 Long term (current) use of aspirin: Secondary | ICD-10-CM | POA: Diagnosis not present

## 2018-02-02 DIAGNOSIS — K648 Other hemorrhoids: Secondary | ICD-10-CM | POA: Diagnosis not present

## 2018-02-02 DIAGNOSIS — Z9012 Acquired absence of left breast and nipple: Secondary | ICD-10-CM

## 2018-02-02 DIAGNOSIS — Z9221 Personal history of antineoplastic chemotherapy: Secondary | ICD-10-CM | POA: Insufficient documentation

## 2018-02-02 DIAGNOSIS — E119 Type 2 diabetes mellitus without complications: Secondary | ICD-10-CM | POA: Diagnosis not present

## 2018-02-02 DIAGNOSIS — Z853 Personal history of malignant neoplasm of breast: Secondary | ICD-10-CM | POA: Diagnosis not present

## 2018-02-02 DIAGNOSIS — Z87891 Personal history of nicotine dependence: Secondary | ICD-10-CM | POA: Insufficient documentation

## 2018-02-02 DIAGNOSIS — Z79899 Other long term (current) drug therapy: Secondary | ICD-10-CM | POA: Insufficient documentation

## 2018-02-02 DIAGNOSIS — D649 Anemia, unspecified: Secondary | ICD-10-CM | POA: Insufficient documentation

## 2018-02-02 DIAGNOSIS — I10 Essential (primary) hypertension: Secondary | ICD-10-CM | POA: Diagnosis not present

## 2018-02-02 DIAGNOSIS — Z170 Estrogen receptor positive status [ER+]: Secondary | ICD-10-CM

## 2018-02-02 DIAGNOSIS — E669 Obesity, unspecified: Secondary | ICD-10-CM | POA: Insufficient documentation

## 2018-02-02 DIAGNOSIS — C50112 Malignant neoplasm of central portion of left female breast: Secondary | ICD-10-CM | POA: Diagnosis not present

## 2018-02-02 DIAGNOSIS — Z79811 Long term (current) use of aromatase inhibitors: Secondary | ICD-10-CM

## 2018-02-02 DIAGNOSIS — Z923 Personal history of irradiation: Secondary | ICD-10-CM | POA: Insufficient documentation

## 2018-02-02 DIAGNOSIS — Z171 Estrogen receptor negative status [ER-]: Secondary | ICD-10-CM | POA: Insufficient documentation

## 2018-02-02 LAB — CBC WITH DIFFERENTIAL/PLATELET
Basophils Absolute: 0.1 10*3/uL (ref 0.0–0.1)
Basophils Relative: 1 %
Eosinophils Absolute: 0.2 10*3/uL (ref 0.0–0.5)
Eosinophils Relative: 3 %
HCT: 32.2 % — ABNORMAL LOW (ref 34.8–46.6)
Hemoglobin: 10.9 g/dL — ABNORMAL LOW (ref 11.6–15.9)
Lymphocytes Relative: 24 %
Lymphs Abs: 2 10*3/uL (ref 0.9–3.3)
MCH: 34.2 pg — ABNORMAL HIGH (ref 25.1–34.0)
MCHC: 33.9 g/dL (ref 31.5–36.0)
MCV: 101 fL (ref 79.5–101.0)
Monocytes Absolute: 0.4 10*3/uL (ref 0.1–0.9)
Monocytes Relative: 5 %
Neutro Abs: 5.5 10*3/uL (ref 1.5–6.5)
Neutrophils Relative %: 67 %
Platelets: 309 10*3/uL (ref 145–400)
RBC: 3.19 MIL/uL — ABNORMAL LOW (ref 3.70–5.45)
RDW: 13.1 % (ref 11.2–14.5)
WBC: 8.2 10*3/uL (ref 3.9–10.3)

## 2018-02-02 LAB — COMPREHENSIVE METABOLIC PANEL
ALT: 28 U/L (ref 0–55)
AST: 27 U/L (ref 5–34)
Albumin: 4 g/dL (ref 3.5–5.0)
Alkaline Phosphatase: 77 U/L (ref 40–150)
Anion gap: 10 (ref 3–11)
BUN: 35 mg/dL — ABNORMAL HIGH (ref 7–26)
CO2: 24 mmol/L (ref 22–29)
Calcium: 9.8 mg/dL (ref 8.4–10.4)
Chloride: 104 mmol/L (ref 98–109)
Creatinine, Ser: 1.5 mg/dL — ABNORMAL HIGH (ref 0.60–1.10)
GFR calc Af Amer: 41 mL/min — ABNORMAL LOW (ref >60)
GFR calc non Af Amer: 35 mL/min — ABNORMAL LOW (ref >60)
Glucose, Bld: 106 mg/dL (ref 70–140)
Potassium: 4.8 mmol/L (ref 3.5–5.1)
Sodium: 138 mmol/L (ref 136–145)
Total Bilirubin: 0.4 mg/dL (ref 0.2–1.2)
Total Protein: 8 g/dL (ref 6.4–8.3)

## 2018-02-02 MED ORDER — LETROZOLE 2.5 MG PO TABS: 2.5000 mg | ORAL_TABLET | Freq: Every day | ORAL | 3 refills | Status: DC

## 2018-02-02 NOTE — Telephone Encounter (Signed)
Appointments scheduled AVS/Calendar printed per 3/6 los °

## 2018-02-03 ENCOUNTER — Encounter: Payer: Self-pay | Admitting: Hematology

## 2018-02-08 ENCOUNTER — Other Ambulatory Visit: Payer: Self-pay | Admitting: Internal Medicine

## 2018-02-08 DIAGNOSIS — Z1231 Encounter for screening mammogram for malignant neoplasm of breast: Secondary | ICD-10-CM

## 2018-02-09 NOTE — Pre-Procedure Instructions (Signed)
Lindsey Marquez  02/09/2018      [id number] - [place], [place] - [place]  [address] Phone: [phone] Fax: [phone]  [place] [address], [place] - [place] [address] Phone: [phone] Fax: [phone]    Your procedure is scheduled on Thursday March 21.  Report to [facility] Admitting at 8:45 A.M.  Call this number if you have problems the morning of surgery:  [phone]   Remember:  Do not eat food or drink liquids after midnight.  Take these medicines the morning of surgery with A SIP OF WATER: neratinib Maleate (Nerlynx), Ondansetron (zofran) if needed  DO NOT TAKE Sitagliptin (Januvia) or Glucovance (glyburide-metfornin) the day of surgery  7 days prior to surgery STOP taking any Aspirin(unless otherwise instructed by your surgeon), Aleve, Naproxen, Ibuprofen, Motrin, Advil, Goody's, BC's, all herbal medications, fish oil, and all vitamins     How to Manage Your Diabetes Before and After Surgery  Why is it important to control my blood sugar before and after surgery? . Improving blood sugar levels before and after surgery helps healing and can limit problems. . A way of improving blood sugar control is eating a healthy diet by: o  Eating less sugar and carbohydrates o  Increasing activity/exercise o  Talking with your doctor about reaching your blood sugar goals . High blood sugars (greater than 180 mg/dL) can raise your risk of infections and slow your recovery, so you will need to focus on controlling your diabetes during the weeks before surgery. . Make sure that the doctor who takes care of your diabetes knows about your planned surgery including the date and location.  How do I manage my blood sugar before surgery? . Check your blood sugar at least 4 times a day, starting 2 days before surgery, to make sure that the level is not too high or  low. o Check your blood sugar the morning of your surgery when you wake up and every 2 hours until you get to the Short Stay unit. . If your blood sugar is less than 70 mg/dL, you will need to treat for low blood sugar: o Do not take insulin. o Treat a low blood sugar (less than 70 mg/dL) with  cup of clear juice (cranberry or apple), 4 glucose tablets, OR glucose gel. Recheck blood sugar in 15 minutes after treatment (to make sure it is greater than 70 mg/dL). If your blood sugar is not greater than 70 mg/dL on recheck, call [phone] o  for further instructions. . Report your blood sugar to the short stay nurse when you get to Short Stay.  . If you are admitted to the hospital after surgery: o Your blood sugar will be checked by the staff and you will probably be given insulin after surgery (instead of oral diabetes medicines) to make sure you have good blood sugar levels. o The goal for blood sugar control after surgery is 80-180 mg/dL.               Do not wear jewelry, make-up or nail polish.  Do not wear lotions, powders, or perfumes, or deodorant.  Do not shave 48 hours prior to surgery.  Men may shave face and neck.  Do not bring valuables to the hospital.  [facility] is not responsible for any belongings or valuables.  Contacts, dentures or bridgework may not be worn into surgery.  Leave  your suitcase in the car.  After surgery it may be brought to your room.  For patients admitted to the hospital, discharge time will be determined by your treatment team.  Patients discharged the day of surgery will not be allowed to drive home.   Special instructions:    [facility]- Preparing For Surgery  Before surgery, you can play an important role. Because skin is not sterile, your skin needs to be as free of germs as possible. You can reduce the number of germs on your skin by washing with CHG (chlorahexidine gluconate) Soap before surgery.  CHG is an antiseptic cleaner  which kills germs and bonds with the skin to continue killing germs even after washing.  Please do not use if you have an allergy to CHG or antibacterial soaps. If your skin becomes reddened/irritated stop using the CHG.  Do not shave (including legs and underarms) for at least 48 hours prior to first CHG shower. It is OK to shave your face.  Please follow these instructions carefully.   1. Shower the NIGHT BEFORE SURGERY and the MORNING OF SURGERY with CHG.   2. If you chose to wash your hair, wash your hair first as usual with your normal shampoo.  3. After you shampoo, rinse your hair and body thoroughly to remove the shampoo.  4. Use CHG as you would any other liquid soap. You can apply CHG directly to the skin and wash gently with a scrungie or a clean washcloth.   5. Apply the CHG Soap to your body ONLY FROM THE NECK DOWN.  Do not use on open wounds or open sores. Avoid contact with your eyes, ears, mouth and genitals (private parts). Wash Face and genitals (private parts)  with your normal soap.  6. Wash thoroughly, paying special attention to the area where your surgery will be performed.  7. Thoroughly rinse your body with warm water from the neck down.  8. DO NOT shower/wash with your normal soap after using and rinsing off the CHG Soap.  9. Pat yourself dry with a CLEAN TOWEL.  10. Wear CLEAN PAJAMAS to bed the night before surgery, wear comfortable clothes the morning of surgery  11. Place CLEAN SHEETS on your bed the night of your first shower and DO NOT SLEEP WITH PETS.    Day of Surgery: Do not apply any deodorants/lotions. Please wear clean clothes to the hospital/surgery center.      Please read over the following fact sheets that you were given. Coughing and Deep Breathing and Surgical Site Infection Prevention

## 2018-02-10 ENCOUNTER — Encounter (HOSPITAL_COMMUNITY)
Admission: RE | Admit: 2018-02-10 | Discharge: 2018-02-10 | Disposition: A | Discharge status: Home / Self Care | Payer: Medicare HMO | Source: Ambulatory Visit | Attending: Surgery | Admitting: Surgery

## 2018-02-10 ENCOUNTER — Encounter (HOSPITAL_COMMUNITY): Payer: Self-pay

## 2018-02-10 DIAGNOSIS — Z01812 Encounter for preprocedural laboratory examination: Secondary | ICD-10-CM | POA: Diagnosis not present

## 2018-02-10 DIAGNOSIS — E119 Type 2 diabetes mellitus without complications: Secondary | ICD-10-CM | POA: Diagnosis not present

## 2018-02-10 LAB — CBC
HCT: 30.8 % — ABNORMAL LOW (ref 36.0–46.0)
Hemoglobin: 10.3 g/dL — ABNORMAL LOW (ref 12.0–15.0)
MCH: 33.9 pg (ref 26.0–34.0)
MCHC: 33.4 g/dL (ref 30.0–36.0)
MCV: 101.3 fL — ABNORMAL HIGH (ref 78.0–100.0)
Platelets: 327 10*3/uL (ref 150–400)
RBC: 3.04 MIL/uL — ABNORMAL LOW (ref 3.87–5.11)
RDW: 13.1 % (ref 11.5–15.5)
WBC: 8.1 10*3/uL (ref 4.0–10.5)

## 2018-02-10 LAB — GLUCOSE, CAPILLARY: Glucose-Capillary: 233 mg/dL — ABNORMAL HIGH (ref 65–99)

## 2018-02-10 LAB — BASIC METABOLIC PANEL
Anion gap: 10 (ref 5–15)
BUN: 19 mg/dL (ref 6–20)
CO2: 20 mmol/L — ABNORMAL LOW (ref 22–32)
Calcium: 9.1 mg/dL (ref 8.9–10.3)
Chloride: 107 mmol/L (ref 101–111)
Creatinine, Ser: 1.02 mg/dL — ABNORMAL HIGH (ref 0.44–1.00)
GFR calc Af Amer: >60 mL/min (ref >60)
GFR calc non Af Amer: 56 mL/min — ABNORMAL LOW (ref >60)
Glucose, Bld: 216 mg/dL — ABNORMAL HIGH (ref 65–99)
Potassium: 4.2 mmol/L (ref 3.5–5.1)
Sodium: 137 mmol/L (ref 135–145)

## 2018-02-10 LAB — HEMOGLOBIN A1C
Hgb A1c MFr Bld: 7.5 % — ABNORMAL HIGH (ref 4.8–5.6)
Mean Plasma Glucose: 168.55 mg/dL

## 2018-02-10 MED ORDER — CHLORHEXIDINE GLUCONATE CLOTH 2 % EX PADS: 6.0000 | MEDICATED_PAD | Freq: Once | CUTANEOUS | Status: DC

## 2018-02-14 ENCOUNTER — Telehealth: Payer: Self-pay

## 2018-02-14 NOTE — Telephone Encounter (Signed)
PA has been completed and was approved through 11/29/2018 via [employer] through [email or web address]

## 2018-02-17 ENCOUNTER — Ambulatory Visit (HOSPITAL_COMMUNITY): Payer: Medicare HMO | Admitting: Anesthesiology

## 2018-02-17 ENCOUNTER — Encounter (HOSPITAL_COMMUNITY)
Admission: RE | Disposition: A | Discharge status: Home / Self Care | Payer: Self-pay | Source: Ambulatory Visit | Attending: Surgery

## 2018-02-17 ENCOUNTER — Encounter (HOSPITAL_COMMUNITY): Payer: Self-pay

## 2018-02-17 ENCOUNTER — Observation Stay (HOSPITAL_COMMUNITY)
Admission: RE | Admit: 2018-02-17 | Discharge: 2018-02-18 | Disposition: A | Discharge status: Home / Self Care | Payer: Medicare HMO | Source: Ambulatory Visit | Attending: Surgery | Admitting: Surgery

## 2018-02-17 ENCOUNTER — Other Ambulatory Visit: Payer: Self-pay

## 2018-02-17 DIAGNOSIS — K439 Ventral hernia without obstruction or gangrene: Secondary | ICD-10-CM | POA: Diagnosis not present

## 2018-02-17 DIAGNOSIS — Z7982 Long term (current) use of aspirin: Secondary | ICD-10-CM | POA: Diagnosis not present

## 2018-02-17 DIAGNOSIS — Z853 Personal history of malignant neoplasm of breast: Secondary | ICD-10-CM | POA: Insufficient documentation

## 2018-02-17 DIAGNOSIS — Z885 Allergy status to narcotic agent status: Secondary | ICD-10-CM | POA: Diagnosis not present

## 2018-02-17 DIAGNOSIS — Z87891 Personal history of nicotine dependence: Secondary | ICD-10-CM | POA: Diagnosis not present

## 2018-02-17 DIAGNOSIS — D649 Anemia, unspecified: Secondary | ICD-10-CM | POA: Diagnosis not present

## 2018-02-17 DIAGNOSIS — I700 Atherosclerosis of aorta: Secondary | ICD-10-CM | POA: Diagnosis not present

## 2018-02-17 DIAGNOSIS — K760 Fatty (change of) liver, not elsewhere classified: Secondary | ICD-10-CM | POA: Diagnosis not present

## 2018-02-17 DIAGNOSIS — Z7984 Long term (current) use of oral hypoglycemic drugs: Secondary | ICD-10-CM | POA: Insufficient documentation

## 2018-02-17 DIAGNOSIS — I10 Essential (primary) hypertension: Secondary | ICD-10-CM | POA: Insufficient documentation

## 2018-02-17 DIAGNOSIS — Z79899 Other long term (current) drug therapy: Secondary | ICD-10-CM | POA: Diagnosis not present

## 2018-02-17 DIAGNOSIS — E119 Type 2 diabetes mellitus without complications: Secondary | ICD-10-CM | POA: Insufficient documentation

## 2018-02-17 DIAGNOSIS — N950 Postmenopausal bleeding: Secondary | ICD-10-CM | POA: Diagnosis not present

## 2018-02-17 DIAGNOSIS — K432 Incisional hernia without obstruction or gangrene: Secondary | ICD-10-CM | POA: Diagnosis present

## 2018-02-17 HISTORY — PX: VENTRAL HERNIA REPAIR: SHX424

## 2018-02-17 HISTORY — PX: INSERTION OF MESH: SHX5868

## 2018-02-17 LAB — GLUCOSE, CAPILLARY
Glucose-Capillary: 170 mg/dL — ABNORMAL HIGH (ref 65–99)
Glucose-Capillary: 156 mg/dL — ABNORMAL HIGH (ref 65–99)
Glucose-Capillary: 292 mg/dL — ABNORMAL HIGH (ref 65–99)
Glucose-Capillary: 190 mg/dL — ABNORMAL HIGH (ref 65–99)

## 2018-02-17 SURGERY — REPAIR, HERNIA, VENTRAL
Anesthesia: General | Site: Abdomen

## 2018-02-17 MED ORDER — ONDANSETRON 4 MG PO TBDP: 4.0000 mg | ORAL_TABLET | Freq: Four times a day (QID) | ORAL | Status: DC | PRN

## 2018-02-17 MED ORDER — LETROZOLE 2.5 MG PO TABS
2.5000 mg | ORAL_TABLET | Freq: Every day | ORAL | Status: DC
  Administered 2018-02-17 – 2018-02-18 (×2): 2.5 mg via ORAL
  Filled 2018-02-17 (×2): qty 1

## 2018-02-17 MED ORDER — DIPHENHYDRAMINE HCL 12.5 MG/5ML PO ELIX: 12.5000 mg | ORAL_SOLUTION | Freq: Four times a day (QID) | ORAL | Status: DC | PRN

## 2018-02-17 MED ORDER — INSULIN ASPART 100 UNIT/ML ~~LOC~~ SOLN
0.0000 [IU] | Freq: Three times a day (TID) | SUBCUTANEOUS | Status: DC
Start: 2018-02-17 — End: 2018-02-18
  Administered 2018-02-17: 5 [IU] via SUBCUTANEOUS

## 2018-02-17 MED ORDER — KETOROLAC TROMETHAMINE 30 MG/ML IJ SOLN
INTRAMUSCULAR | Status: AC
  Filled 2018-02-17: qty 1

## 2018-02-17 MED ORDER — GLYBURIDE 5 MG PO TABS
5.0000 mg | ORAL_TABLET | Freq: Every day | ORAL | Status: DC
  Filled 2018-02-17: qty 1

## 2018-02-17 MED ORDER — LIDOCAINE HCL (CARDIAC) 20 MG/ML IV SOLN
INTRAVENOUS | Status: AC
  Filled 2018-02-17: qty 5

## 2018-02-17 MED ORDER — KETOROLAC TROMETHAMINE 30 MG/ML IJ SOLN
INTRAMUSCULAR | Status: DC | PRN
  Administered 2018-02-17: 30 mg via INTRAVENOUS

## 2018-02-17 MED ORDER — ENOXAPARIN SODIUM 40 MG/0.4ML ~~LOC~~ SOLN: 40.0000 mg | SUBCUTANEOUS | Status: DC

## 2018-02-17 MED ORDER — PROMETHAZINE HCL 25 MG/ML IJ SOLN: 6.2500 mg | INTRAMUSCULAR | Status: DC | PRN

## 2018-02-17 MED ORDER — SODIUM CHLORIDE 0.9 % IV SOLN
INTRAVENOUS | Status: DC
  Administered 2018-02-17: 13:00:00 via INTRAVENOUS

## 2018-02-17 MED ORDER — MIDAZOLAM HCL 5 MG/5ML IJ SOLN
INTRAMUSCULAR | Status: DC | PRN
  Administered 2018-02-17: 2 mg via INTRAVENOUS

## 2018-02-17 MED ORDER — TORSEMIDE 20 MG PO TABS: 20.0000 mg | ORAL_TABLET | Freq: Two times a day (BID) | ORAL | Status: DC | PRN

## 2018-02-17 MED ORDER — LISINOPRIL-HYDROCHLOROTHIAZIDE 20-25 MG PO TABS: 1.0000 | ORAL_TABLET | Freq: Every day | ORAL | Status: DC

## 2018-02-17 MED ORDER — FENTANYL CITRATE (PF) 100 MCG/2ML IJ SOLN
INTRAMUSCULAR | Status: DC | PRN
  Administered 2018-02-17: 50 ug via INTRAVENOUS
  Administered 2018-02-17: 150 ug via INTRAVENOUS

## 2018-02-17 MED ORDER — MORPHINE SULFATE (PF) 4 MG/ML IV SOLN: 2.0000 mg | INTRAVENOUS | Status: DC | PRN

## 2018-02-17 MED ORDER — LACTATED RINGERS IV SOLN
INTRAVENOUS | Status: DC
  Administered 2018-02-17: 10:00:00 via INTRAVENOUS

## 2018-02-17 MED ORDER — ONDANSETRON HCL 4 MG/2ML IJ SOLN: 4.0000 mg | Freq: Four times a day (QID) | INTRAMUSCULAR | Status: DC | PRN

## 2018-02-17 MED ORDER — CEFAZOLIN SODIUM-DEXTROSE 2-4 GM/100ML-% IV SOLN
2.0000 g | INTRAVENOUS | Status: AC
  Administered 2018-02-17: 2 g via INTRAVENOUS
  Filled 2018-02-17: qty 100

## 2018-02-17 MED ORDER — PHENYLEPHRINE HCL 10 MG/ML IJ SOLN
INTRAMUSCULAR | Status: DC | PRN
  Administered 2018-02-17: 80 ug via INTRAVENOUS
  Administered 2018-02-17: 120 ug via INTRAVENOUS
  Administered 2018-02-17: 80 ug via INTRAVENOUS
  Administered 2018-02-17: 120 ug via INTRAVENOUS

## 2018-02-17 MED ORDER — HYDROCHLOROTHIAZIDE 25 MG PO TABS
25.0000 mg | ORAL_TABLET | Freq: Every day | ORAL | Status: DC
  Administered 2018-02-18: 25 mg via ORAL
  Filled 2018-02-17 (×2): qty 1

## 2018-02-17 MED ORDER — DEXAMETHASONE SODIUM PHOSPHATE 10 MG/ML IJ SOLN
INTRAMUSCULAR | Status: AC
  Filled 2018-02-17: qty 1

## 2018-02-17 MED ORDER — DEXAMETHASONE SODIUM PHOSPHATE 10 MG/ML IJ SOLN
INTRAMUSCULAR | Status: DC | PRN
  Administered 2018-02-17: 5 mg via INTRAVENOUS

## 2018-02-17 MED ORDER — FENTANYL CITRATE (PF) 250 MCG/5ML IJ SOLN
INTRAMUSCULAR | Status: AC
  Filled 2018-02-17: qty 5

## 2018-02-17 MED ORDER — HYDROMORPHONE HCL 1 MG/ML IJ SOLN
INTRAMUSCULAR | Status: AC
Start: 2018-02-17 — End: 2018-02-17
  Administered 2018-02-17: 0.5 mg via INTRAVENOUS
  Filled 2018-02-17: qty 1

## 2018-02-17 MED ORDER — 0.9 % SODIUM CHLORIDE (POUR BTL) OPTIME
TOPICAL | Status: DC | PRN
  Administered 2018-02-17: 1000 mL

## 2018-02-17 MED ORDER — GLYBURIDE 5 MG PO TABS
5.0000 mg | ORAL_TABLET | Freq: Every day | ORAL | Status: DC
Start: 2018-02-17 — End: 2018-02-18
  Administered 2018-02-17 – 2018-02-18 (×2): 5 mg via ORAL
  Filled 2018-02-17 (×2): qty 1

## 2018-02-17 MED ORDER — POTASSIUM CHLORIDE 20 MEQ PO PACK
40.0000 meq | PACK | Freq: Every day | ORAL | Status: DC
  Administered 2018-02-17: 40 meq via ORAL
  Filled 2018-02-17 (×2): qty 2

## 2018-02-17 MED ORDER — ONDANSETRON HCL 4 MG/2ML IJ SOLN
INTRAMUSCULAR | Status: AC
  Filled 2018-02-17: qty 2

## 2018-02-17 MED ORDER — ROCURONIUM BROMIDE 10 MG/ML (PF) SYRINGE
PREFILLED_SYRINGE | INTRAVENOUS | Status: AC
  Filled 2018-02-17: qty 5

## 2018-02-17 MED ORDER — OXYCODONE HCL 5 MG PO TABS
5.0000 mg | ORAL_TABLET | ORAL | Status: DC | PRN
  Administered 2018-02-17: 10 mg via ORAL
  Administered 2018-02-17: 5 mg via ORAL
  Administered 2018-02-18 (×2): 10 mg via ORAL
  Filled 2018-02-17 (×4): qty 2

## 2018-02-17 MED ORDER — ACETAMINOPHEN 650 MG RE SUPP: 650.0000 mg | Freq: Four times a day (QID) | RECTAL | Status: DC | PRN

## 2018-02-17 MED ORDER — MIDAZOLAM HCL 2 MG/2ML IJ SOLN
INTRAMUSCULAR | Status: AC
  Filled 2018-02-17: qty 2

## 2018-02-17 MED ORDER — ROCURONIUM BROMIDE 100 MG/10ML IV SOLN
INTRAVENOUS | Status: DC | PRN
  Administered 2018-02-17: 50 mg via INTRAVENOUS

## 2018-02-17 MED ORDER — METFORMIN HCL 500 MG PO TABS
1000.0000 mg | ORAL_TABLET | Freq: Every day | ORAL | Status: DC
  Administered 2018-02-17 – 2018-02-18 (×2): 1000 mg via ORAL
  Filled 2018-02-17 (×2): qty 2

## 2018-02-17 MED ORDER — HYDROMORPHONE HCL 1 MG/ML IJ SOLN
0.2500 mg | INTRAMUSCULAR | Status: DC | PRN
  Administered 2018-02-17 (×2): 0.5 mg via INTRAVENOUS

## 2018-02-17 MED ORDER — BUPIVACAINE-EPINEPHRINE 0.25% -1:200000 IJ SOLN
INTRAMUSCULAR | Status: DC | PRN
  Administered 2018-02-17: 10 mL

## 2018-02-17 MED ORDER — SUGAMMADEX SODIUM 200 MG/2ML IV SOLN
INTRAVENOUS | Status: DC | PRN
  Administered 2018-02-17: 225 mg via INTRAVENOUS

## 2018-02-17 MED ORDER — METFORMIN HCL 500 MG PO TABS: 1000.0000 mg | ORAL_TABLET | Freq: Every day | ORAL | Status: DC

## 2018-02-17 MED ORDER — KETOROLAC TROMETHAMINE 30 MG/ML IJ SOLN
30.0000 mg | Freq: Once | INTRAMUSCULAR | Status: DC | PRN
  Administered 2018-02-17: 30 mg via INTRAVENOUS

## 2018-02-17 MED ORDER — LISINOPRIL 20 MG PO TABS
20.0000 mg | ORAL_TABLET | Freq: Every day | ORAL | Status: DC
  Administered 2018-02-18: 20 mg via ORAL
  Filled 2018-02-17 (×2): qty 1

## 2018-02-17 MED ORDER — MEPERIDINE HCL 50 MG/ML IJ SOLN: 6.2500 mg | INTRAMUSCULAR | Status: DC | PRN

## 2018-02-17 MED ORDER — ACETAMINOPHEN 325 MG PO TABS: 650.0000 mg | ORAL_TABLET | Freq: Four times a day (QID) | ORAL | Status: DC | PRN

## 2018-02-17 MED ORDER — LINAGLIPTIN 5 MG PO TABS
5.0000 mg | ORAL_TABLET | Freq: Every day | ORAL | Status: DC
  Administered 2018-02-17 – 2018-02-18 (×2): 5 mg via ORAL
  Filled 2018-02-17 (×2): qty 1

## 2018-02-17 MED ORDER — LIDOCAINE HCL (CARDIAC) 20 MG/ML IV SOLN
INTRAVENOUS | Status: DC | PRN
  Administered 2018-02-17: 100 mg via INTRAVENOUS

## 2018-02-17 MED ORDER — DEXTROMETHORPHAN-GUAIFENESIN 10-100 MG/5ML PO LIQD: 5.0000 mL | ORAL | Status: DC | PRN

## 2018-02-17 MED ORDER — GLYBURIDE-METFORMIN 2.5-500 MG PO TABS: 2.0000 | ORAL_TABLET | Freq: Two times a day (BID) | ORAL | Status: DC

## 2018-02-17 MED ORDER — ONDANSETRON HCL 4 MG/2ML IJ SOLN
INTRAMUSCULAR | Status: DC | PRN
  Administered 2018-02-17: 4 mg via INTRAVENOUS

## 2018-02-17 MED ORDER — PHENYLEPHRINE HCL 10 MG/ML IJ SOLN
INTRAVENOUS | Status: DC | PRN
  Administered 2018-02-17: 60 ug/min via INTRAVENOUS

## 2018-02-17 MED ORDER — NERATINIB MALEATE 40 MG PO TABS: 240.0000 mg | ORAL_TABLET | Freq: Every day | ORAL | Status: DC

## 2018-02-17 MED ORDER — PROPOFOL 10 MG/ML IV BOLUS
INTRAVENOUS | Status: DC | PRN
  Administered 2018-02-17: 200 mg via INTRAVENOUS

## 2018-02-17 MED ORDER — DIPHENHYDRAMINE HCL 50 MG/ML IJ SOLN: 12.5000 mg | Freq: Four times a day (QID) | INTRAMUSCULAR | Status: DC | PRN

## 2018-02-17 SURGICAL SUPPLY — 35 items
BENZOIN TINCTURE PRP APPL 2/3 (GAUZE/BANDAGES/DRESSINGS) ×2 IMPLANT
BLADE CLIPPER SURG (BLADE) IMPLANT
CANISTER SUCT 3000ML PPV (MISCELLANEOUS) ×2 IMPLANT
CHLORAPREP W/TINT 26ML (MISCELLANEOUS) IMPLANT
COVER SURGICAL LIGHT HANDLE (MISCELLANEOUS) ×2 IMPLANT
DRAPE LAPAROSCOPIC ABDOMINAL (DRAPES) ×2 IMPLANT
DRSG TEGADERM 4X4.75 (GAUZE/BANDAGES/DRESSINGS) ×2 IMPLANT
ELECT CAUTERY BLADE 6.4 (BLADE) ×2 IMPLANT
ELECT REM PT RETURN 9FT ADLT (ELECTROSURGICAL) ×2
ELECTRODE REM PT RTRN 9FT ADLT (ELECTROSURGICAL) ×1 IMPLANT
GAUZE SPONGE 4X4 12PLY STRL (GAUZE/BANDAGES/DRESSINGS) ×2 IMPLANT
GLOVE BIO SURGEON STRL SZ7 (GLOVE) ×2 IMPLANT
GLOVE BIOGEL PI IND STRL 7.5 (GLOVE) ×1 IMPLANT
GLOVE BIOGEL PI INDICATOR 7.5 (GLOVE) ×1
GOWN STRL REUS W/ TWL LRG LVL3 (GOWN DISPOSABLE) ×2 IMPLANT
GOWN STRL REUS W/TWL LRG LVL3 (GOWN DISPOSABLE) ×2
KIT BASIN OR (CUSTOM PROCEDURE TRAY) ×2 IMPLANT
KIT ROOM TURNOVER OR (KITS) ×2 IMPLANT
MESH VENTRALEX ST 1-7/10 CRC S (Mesh General) ×2 IMPLANT
NEEDLE HYPO 25GX1X1/2 BEV (NEEDLE) ×2 IMPLANT
NS IRRIG 1000ML POUR BTL (IV SOLUTION) ×2 IMPLANT
PACK GENERAL/GYN (CUSTOM PROCEDURE TRAY) ×2 IMPLANT
PAD ARMBOARD 7.5X6 YLW CONV (MISCELLANEOUS) ×4 IMPLANT
STRIP CLOSURE SKIN 1/2X4 (GAUZE/BANDAGES/DRESSINGS) ×2 IMPLANT
SUT MNCRL AB 4-0 PS2 18 (SUTURE) ×2 IMPLANT
SUT NOVA NAB GS-21 0 18 T12 DT (SUTURE) IMPLANT
SUT NOVA NAB GS-21 1 T12 (SUTURE) IMPLANT
SUT SILK 2 0 (SUTURE) ×1
SUT SILK 2-0 18XBRD TIE 12 (SUTURE) ×1 IMPLANT
SUT VIC AB 3-0 SH 27 (SUTURE) ×1
SUT VIC AB 3-0 SH 27XBRD (SUTURE) ×1 IMPLANT
SYR CONTROL 10ML LL (SYRINGE) ×2 IMPLANT
TOWEL OR 17X24 6PK STRL BLUE (TOWEL DISPOSABLE) ×2 IMPLANT
TOWEL OR 17X26 10 PK STRL BLUE (TOWEL DISPOSABLE) ×2 IMPLANT
TRAY FOLEY W/METER SILVER 14FR (SET/KITS/TRAYS/PACK) IMPLANT

## 2018-02-17 NOTE — Interval H&P Note (Signed)
History and Physical Interval Note:  02/17/2018 9:22 AM  Lindsey Marquez  has presented today for surgery, with the diagnosis of VENTRAL HERNIA  The various methods of treatment have been discussed with the patient and family. After consideration of risks, benefits and other options for treatment, the patient has consented to  Procedure(s): OPEN VENTRAL HERNIA REPAIR WITH MESH (N/A) INSERTION OF MESH (N/A) as a surgical intervention .  The patient's history has been reviewed, patient examined, no change in status, stable for surgery.  I have reviewed the patient's chart and labs.  Questions were answered to the patient's satisfaction.     Maia Petties

## 2018-02-17 NOTE — Anesthesia Preprocedure Evaluation (Addendum)
Anesthesia Evaluation  Patient identified by MRN, date of birth, ID band Patient awake    Reviewed: Allergy & Precautions, NPO status , Patient's Chart, lab work & pertinent test results  Airway Mallampati: I       Dental  (+) Missing,    Pulmonary former smoker,    Pulmonary exam normal breath sounds clear to auscultation       Cardiovascular hypertension, Pt. on medications Normal cardiovascular exam Rhythm:Regular Rate:Normal     Neuro/Psych negative psych ROS   GI/Hepatic negative GI ROS, Neg liver ROS,   Endo/Other  diabetes, Type 2, Oral Hypoglycemic Agents  Renal/GU negative Renal ROS  negative genitourinary   Musculoskeletal negative musculoskeletal ROS (+)   Abdominal (+) + obese,   Peds  Hematology negative hematology ROS (+) Blood dyscrasia, anemia ,   Anesthesia Other Findings   Reproductive/Obstetrics                            Anesthesia Physical Anesthesia Plan  ASA: II  Anesthesia Plan: General   Post-op Pain Management:    Induction: Intravenous  PONV Risk Score and Plan: 4 or greater and Ondansetron, Dexamethasone and Midazolam  Airway Management Planned: Oral ETT  Additional Equipment:   Intra-op Plan:   Post-operative Plan: Extubation in OR  Informed Consent: I have reviewed the patients History and Physical, chart, labs and discussed the procedure including the risks, benefits and alternatives for the proposed anesthesia with the patient or authorized representative who has indicated his/her understanding and acceptance.   Dental advisory given  Plan Discussed with: CRNA and Surgeon  Anesthesia Plan Comments:         Anesthesia Quick Evaluation

## 2018-02-17 NOTE — Plan of Care (Signed)
?  Problem: Clinical Measurements: ?Goal: Ability to maintain clinical measurements within normal limits will improve ?Outcome: Progressing ?Goal: Will remain free from infection ?Outcome: Progressing ?Goal: Diagnostic test results will improve ?Outcome: Progressing ?  ?

## 2018-02-17 NOTE — Progress Notes (Signed)
Orthopedic Tech Progress Note Patient Details:  Lindsey Marquez 1951-09-25 [id number]  Ortho Devices Type of Ortho Device: Abdominal binder Ortho Device/Splint Location: bedside Ortho Device/Splint Interventions: Criss Alvine 02/17/2018, 5:30 PM

## 2018-02-17 NOTE — Transfer of Care (Signed)
Immediate Anesthesia Transfer of Care Note  Patient: Lindsey Marquez  Procedure(s) Performed: OPEN VENTRAL HERNIA REPAIR WITH MESH (N/A Abdomen) INSERTION OF MESH (N/A Abdomen)  Patient Location: PACU  Anesthesia Type:General  Level of Consciousness: awake, alert , oriented and patient cooperative  Airway & Oxygen Therapy: Patient Spontanous Breathing  Post-op Assessment: Report given to RN and Post -op Vital signs reviewed and stable  Post vital signs: Reviewed and stable  Last Vitals:  Vitals Value Taken Time  BP 147/67 02/17/2018 11:52 AM  Temp    Pulse 115 02/17/2018 11:53 AM  Resp 20 02/17/2018 11:53 AM  SpO2 94 % 02/17/2018 11:53 AM  Vitals shown include unvalidated device data.  Last Pain:  Vitals:   02/17/18 0918  TempSrc:   PainSc: 0-No pain         Complications: No apparent anesthesia complications

## 2018-02-17 NOTE — Anesthesia Procedure Notes (Signed)
Procedure Name: Intubation Date/Time: 02/17/2018 10:55 AM Performed by: White, Amedeo Plenty, CRNA Pre-anesthesia Checklist: Patient identified, Emergency Drugs available, Suction available and Patient being monitored Patient Re-evaluated:Patient Re-evaluated prior to induction Oxygen Delivery Method: Circle System Utilized Preoxygenation: Pre-oxygenation with 100% oxygen Induction Type: IV induction Ventilation: Mask ventilation without difficulty Laryngoscope Size: Mac and 3 Grade View: Grade I Tube type: Oral Tube size: 7.0 mm Number of attempts: 1 Airway Equipment and Method: Stylet Placement Confirmation: ETT inserted through vocal cords under direct vision,  positive ETCO2 and breath sounds checked- equal and bilateral Secured at: 22 cm Tube secured with: Tape Dental Injury: Teeth and Oropharynx as per pre-operative assessment

## 2018-02-17 NOTE — Anesthesia Postprocedure Evaluation (Signed)
Anesthesia Post Note  Patient: Lindsey Marquez  Procedure(s) Performed: OPEN VENTRAL HERNIA REPAIR WITH MESH (N/A Abdomen) INSERTION OF MESH (N/A Abdomen)     Patient location during evaluation: PACU Anesthesia Type: General Level of consciousness: awake Pain management: pain level controlled Vital Signs Assessment: post-procedure vital signs reviewed and stable Respiratory status: spontaneous breathing Cardiovascular status: stable Postop Assessment: no apparent nausea or vomiting Anesthetic complications: no    Last Vitals:  Vitals:   02/17/18 1245 02/17/18 1248  BP: 126/63   Pulse: 94   Resp: 17   Temp:  37 C  SpO2: 94% 94%    Last Pain:  Vitals:   02/17/18 1248  TempSrc:   PainSc: 0-No pain   Pain Goal:                 Devantae Babe JR,JOHN Yardley [place]

## 2018-02-17 NOTE — Op Note (Signed)
Ventral Hernia Repair Procedure Note  Indications: Symptomatic ventral hernia - located just above umbilicus with 5 cm palpable mass  Pre-operative Diagnosis: Ventral hernia  Post-operative Diagnosis: Ventral hernia (2 cm fascial defect)  Surgeon: Maia Petties   Assistants: Arrie Aran - RNFA  Anesthesia: General endotracheal anesthesia  ASA Class: 2  Procedure Details  The patient was seen in the Holding Room. The risks, benefits, complications, treatment options, and expected outcomes were discussed with the patient. The possibilities of reaction to medication, pulmonary aspiration, perforation of viscus, bleeding, recurrent infection, the need for additional procedures, failure to diagnose a condition, and creating a complication requiring transfusion or operation were discussed with the patient. The patient concurred with the proposed plan, giving informed consent.  The site of surgery properly noted/marked. The patient was taken to the operating room, identified as Lindsey Marquez and the procedure verified as ventral hernia repair. A Time Out was held and the above information confirmed.  The patient was placed supine.  After establishing general anesthesia, the abdomen was prepped with Chloraprep and draped in sterile fashion.  We made a vertical incision over the palpable hernia above the umbilicus. Dissection was carried down to the hernia sac located above the fascia and mobilized from surrounding structures.  The hernia sac contained only omentum.  We opened the hernia sac, but we were unable to completely reduce the omentum.  We resected part of the omentum with Kelly clamps and 2-0 silk ties.  The remainder of the omentum was reduced.  Intact fascia was identified circumferentially around the defect.  The defect measured about 2 cm across.  We used a small Ventralex mesh and secured this to the fascia with interrupted 0 Novofil sutures.  The fascial defect was reapproximated with  interrupted figure-of-8 1 Novofil sutures.  The subcutaneous tissues were irrigated.    Hemostasis was confirmed.  The skin incision was closed in layers with a 3-0 Vicryl subcutaneous suture and a 4-0 Monocryl subcuticular closure.  Steri-Strips were applied at the end of the operation.    Instrument, sponge, and needle counts were correct prior to closure and at the conclusion of the case.   Findings: 2 cm defect; hernia sac contained only omentum  Estimated Blood Loss:  Minimal         Drains: none                      Complications:  None; patient tolerated the procedure well.         Disposition: PACU - hemodynamically stable.         Condition: stable  Imogene Burn. Georgette Dover, MD, [facility]  General/ Trauma Surgery  02/17/2018 11:46 AM

## 2018-02-18 ENCOUNTER — Encounter (HOSPITAL_COMMUNITY): Payer: Self-pay | Admitting: Surgery

## 2018-02-18 DIAGNOSIS — K439 Ventral hernia without obstruction or gangrene: Secondary | ICD-10-CM | POA: Diagnosis not present

## 2018-02-18 LAB — GLUCOSE, CAPILLARY: Glucose-Capillary: 128 mg/dL — ABNORMAL HIGH (ref 65–99)

## 2018-02-18 MED ORDER — OXYCODONE HCL 5 MG PO TABS: 5.0000 mg | ORAL_TABLET | ORAL | 0 refills | Status: DC | PRN

## 2018-02-18 NOTE — Progress Notes (Signed)
Discharge paperwork reviewed with patient. No questions verbalized by patient. IV removed. Patient is ready for discharge.

## 2018-02-18 NOTE — Discharge Instructions (Signed)
[facility]  HERNIA REPAIR: POST OP INSTRUCTIONS  Always review your discharge instruction sheet given to you by the facility where your surgery was performed. IF YOU HAVE DISABILITY OR FAMILY LEAVE FORMS, YOU MUST BRING THEM TO THE OFFICE FOR PROCESSING.   DO NOT GIVE THEM TO YOUR DOCTOR.  1. A  prescription for pain medication may be given to you upon discharge.  Take your pain medication as prescribed, if needed.  If narcotic pain medicine is not needed, then you may take acetaminophen (Tylenol) or ibuprofen (Advil) as needed. 2. Take your usually prescribed medications unless otherwise directed. If you need a refill on your pain medication, please contact your pharmacy.  They will contact our office to request authorization. Prescriptions will not be filled after 5 pm or on week-ends. 3. You should follow a light diet the first 24 hours after arrival home, such as soup and crackers, etc.  Be sure to include lots of fluids daily.  Resume your normal diet the day after surgery. 4.Most patients will experience some swelling and bruising around the incision.  Ice packs and reclining will help.  Swelling and bruising can take several days to resolve.  6. It is common to experience some constipation if taking pain medication after surgery.  Increasing fluid intake and taking a stool softener (such as Colace) will usually help or prevent this problem from occurring.  A mild laxative (Milk of Magnesia or Miralax) should be taken according to package directions if there are no bowel movements after 48 hours. 7. Unless discharge instructions indicate otherwise, you may remove your bandages 48 hours after surgery, and you may shower at that time.  You may have steri-strips (small skin tapes) in place directly over the incision.  These strips should be left on the skin for 7-10 days.   8. ACTIVITIES:  You may resume regular (light) daily activities beginning the next day--such as  daily self-care, walking, climbing stairs--gradually increasing activities as tolerated.  You may have sexual intercourse when it is comfortable.  Refrain from any heavy lifting or straining until approved by your doctor.  a.You may drive when you are no longer taking prescription pain medication, you can comfortably wear a seatbelt, and you can safely maneuver your car and apply brakes. b.RETURN TO WORK:   _____________________________________________  9.You should see your doctor in the office for a follow-up appointment approximately 2-3 weeks after your surgery.  Make sure that you call for this appointment within a day or two after you arrive home to insure a convenient appointment time. 10.OTHER INSTRUCTIONS: _________________________    _____________________________________  WHEN TO CALL YOUR DOCTOR: 1. Fever over 101.0 2. Inability to urinate 3. Nausea and/or vomiting 4. Extreme swelling or bruising 5. Continued bleeding from incision. 6. Increased pain, redness, or drainage from the incision  The clinic staff is available to answer your questions during regular business hours.  Please dont hesitate to call and ask to speak to one of the nurses for clinical concerns.  If you have a medical emergency, go to the nearest emergency room or call 911.  A surgeon from [facility] is always on call at the hospital   [address] [place], [place], [place]  [address] ?  [address] [place] [address], [place], [place]   [address] [phone] ? [phone] ? FAX [phone] Web site: [email or web address]

## 2018-02-18 NOTE — Discharge Summary (Signed)
Physician Discharge Summary  Patient ID: Lindsey Marquez MRN: [id number] DOB/AGE: Jul 15, 1951 67 y.o.  Admit date: 02/17/2018 Discharge date: 02/18/2018  Admission Diagnoses:  Ventral hernia  Discharge Diagnoses: Ventral hernia Active Problems:   Ventral incisional hernia   Discharged Condition: good  Hospital Course: Open repair of small supraumbilical ventral hernia with mesh on 3/21.  She was kept overnight for pain control.  She did well and is tolerating a diet without difficulty.  Pain controlled with PRN Oxycodone.  Ready for discharge.   Treatments: surgery: ventral hernia repair with mesh  Discharge Exam: Blood pressure (!) 123/59, pulse 91, temperature 98.8 F (37.1 C), temperature source Oral, resp. rate 18, height 5\' 7"  (1.702 m), weight 107.8 kg (237 lb 10.5 oz), SpO2 96 %. General appearance: alert, cooperative and no distress GI: soft, incisional tenderness; + BS Dressing - some minimal drainage; no erythema  Disposition: Discharge disposition: 01-Home or Self Care       Discharge Instructions    Call MD for:  persistant nausea and vomiting   Complete by:  As directed    Call MD for:  redness, tenderness, or signs of infection (pain, swelling, redness, odor or green/yellow discharge around incision site)   Complete by:  As directed    Call MD for:  severe uncontrolled pain   Complete by:  As directed    Call MD for:  temperature >100.4   Complete by:  As directed    Diet general   Complete by:  As directed    Driving Restrictions   Complete by:  As directed    Do not drive while taking pain medications   Increase activity slowly   Complete by:  As directed    May shower / Bathe   Complete by:  As directed      Allergies as of 02/18/2018      Reactions   Codeine Other (See Comments)   "get crazy", "see things that aren't there"      Medication List    TAKE these medications   aspirin 81 MG tablet Take 81 mg by mouth daily as needed (leg  pain).   dextromethorphan-guaiFENesin 10-100 MG/5ML liquid Commonly known as:  DIABETIC TUSSIN DM Take 5 mLs by mouth every 4 (four) hours as needed for cough.   Fish Oil 1200 MG Caps Take 1,200 mg by mouth daily.   glucose blood test strip Commonly known as:  TRUE METRIX BLOOD GLUCOSE TEST 1 each by Other route 3 (three) times daily. Use as instructed   glyBURIDE-metformin 2.5-500 MG tablet Commonly known as:  GLUCOVANCE TAKE 2 TABLETS BY MOUTH 2 (TWO) TIMES DAILY WITH A MEAL.   ibuprofen 200 MG tablet Commonly known as:  ADVIL,MOTRIN Take 400 mg by mouth every 8 (eight) hours as needed for headache or moderate pain.   letrozole 2.5 MG tablet Commonly known as:  FEMARA Take 1 tablet (2.5 mg total) by mouth daily.   lisinopril-hydrochlorothiazide 20-25 MG tablet Commonly known as:  PRINZIDE,ZESTORETIC Take 1 tablet daily by mouth.   Neratinib Maleate 40 MG tablet Commonly known as:  NERLYNX TAKE 6 TABLETS (240MG ) BY MOUTH ONE TIME DAILY WITH FOOD.   ondansetron 4 MG disintegrating tablet Commonly known as:  ZOFRAN-ODT Take 1 tablet (4 mg total) by mouth every 8 (eight) hours as needed for nausea or vomiting.   oxyCODONE 5 MG immediate release tablet Commonly known as:  Oxy IR/ROXICODONE Take 1 tablet (5 mg total) by mouth every 4 (four)  hours as needed for moderate pain.   Potassium Chloride ER 20 MEQ Tbcr Take 40 mEq daily by mouth.   sitaGLIPtin 100 MG tablet Commonly known as:  JANUVIA Take 1 tablet (100 mg total) by mouth daily.   torsemide 20 MG tablet Commonly known as:  DEMADEX Take 20 mg by mouth 2 (two) times daily as needed (swelling).   TRUEPLUS LANCETS 33G Misc 1 each by Does not apply route 3 (three) times daily.      Follow-up Information    [name], MD. Schedule an appointment as soon as possible for a visit in 3 week(s).   Specialty:  General Surgery Contact information: [address] [place]  [address] [phone]           Signed: Maia Petties 02/18/2018, 7:22 AM

## 2018-03-10 ENCOUNTER — Other Ambulatory Visit: Payer: Self-pay | Admitting: Internal Medicine

## 2018-03-10 MED ORDER — SITAGLIPTIN PHOSPHATE 100 MG PO TABS: 100.0000 mg | ORAL_TABLET | Freq: Every day | ORAL | 0 refills | Status: DC

## 2018-03-11 ENCOUNTER — Other Ambulatory Visit: Payer: Medicare HMO

## 2018-03-25 ENCOUNTER — Encounter: Payer: Self-pay | Admitting: Family Medicine

## 2018-03-25 ENCOUNTER — Ambulatory Visit (INDEPENDENT_AMBULATORY_CARE_PROVIDER_SITE_OTHER): Payer: Medicare HMO | Admitting: Family Medicine

## 2018-03-25 VITALS — BP 126/68 | HR 99 | Temp 98.7°F | Ht 67.0 in | Wt 233.2 lb

## 2018-03-25 DIAGNOSIS — R21 Rash and other nonspecific skin eruption: Secondary | ICD-10-CM | POA: Insufficient documentation

## 2018-03-25 DIAGNOSIS — B309 Viral conjunctivitis, unspecified: Secondary | ICD-10-CM | POA: Insufficient documentation

## 2018-03-25 MED ORDER — TRIAMCINOLONE ACETONIDE 0.1 % EX CREA: 1.0000 "application " | TOPICAL_CREAM | Freq: Two times a day (BID) | CUTANEOUS | 0 refills | Status: DC

## 2018-03-25 NOTE — Assessment & Plan Note (Addendum)
Anticipate viral given short duration and because it is improving without treatment. Supportive care reviewed - rec lubricating eye drops, warm compresses. Update if not improving with treatment.

## 2018-03-25 NOTE — Progress Notes (Signed)
BP 126/68 (BP Location: Left Arm, Patient Position: Sitting, Cuff Size: Large)   Pulse 99   Temp 98.7 F (37.1 C) (Oral)   Ht 5\' 7"  (1.702 m)   Wt 233 lb 4 oz (105.8 kg)   SpO2 97%   BMI 36.53 kg/m    CC: eye problems Subjective:    Patient ID: [name], female    DOB: 06-Jun-1951, 67 y.o.   MRN: [id number]  HPI: TONETTA NAPOLES is a 67 y.o. female presenting on 03/25/2018 for Eye Problem (Has bilateral eye redness and watery. Waking in the morings with crusted eyes. Sxs started 03/23/18. ); Cough; and Rash (Located on lower left LE. Area itchy. Notced about 1 yr ago after fininshing chemo.)   2d h/o itchy watery red eyes, waking up with crusting around eyes. Symptoms slowly improving.  Mild nonproductive cough present as well as rhinorrhea worse at night time. + nasal congestion.   No fevers/chills, vision changes, ear or tooth pain, ST, PNDrainage.   Daughter has allergies, grandchildren ok.   LLE itchy rash noted over the past year after she completed chemo for breast cancer. Treating with vaseline which helps. Some other rash spots on arms (L hand, R forearm). No new lotions, detergents, soaps or shampoos. No new medicines.   Relevant past medical, surgical, family and social history reviewed and updated as indicated. Interim medical history since our last visit reviewed. Allergies and medications reviewed and updated. Outpatient Medications Prior to Visit  Medication Sig Dispense Refill  . aspirin 81 MG tablet Take 81 mg by mouth daily as needed (leg pain).     Marland Kitchen dextromethorphan-guaiFENesin (DIABETIC TUSSIN DM) 10-100 MG/5ML liquid Take 5 mLs by mouth every 4 (four) hours as needed for cough. 180 mL 0  . glucose blood (TRUE METRIX BLOOD GLUCOSE TEST) test strip 1 each by Other route 3 (three) times daily. Use as instructed 600 each 2  . glyBURIDE-metformin (GLUCOVANCE) 2.5-500 MG tablet TAKE 2 TABLETS BY MOUTH 2 (TWO) TIMES DAILY WITH A MEAL. 360 tablet 1  .  ibuprofen (ADVIL,MOTRIN) 200 MG tablet Take 400 mg by mouth every 8 (eight) hours as needed for headache or moderate pain.     Marland Kitchen letrozole (FEMARA) 2.5 MG tablet Take 1 tablet (2.5 mg total) by mouth daily. 90 tablet 3  . lisinopril-hydrochlorothiazide (PRINZIDE,ZESTORETIC) 20-25 MG tablet Take 1 tablet daily by mouth. 90 tablet 1  . Neratinib Maleate (NERLYNX) 40 MG tablet TAKE 6 TABLETS (240MG ) BY MOUTH ONE TIME DAILY WITH FOOD. 180 tablet 2  . Omega-3 Fatty Acids (FISH OIL) 1200 MG CAPS Take 1,200 mg by mouth daily.     . ondansetron (ZOFRAN-ODT) 4 MG disintegrating tablet Take 1 tablet (4 mg total) by mouth every 8 (eight) hours as needed for nausea or vomiting. 20 tablet 1  . oxyCODONE (OXY IR/ROXICODONE) 5 MG immediate release tablet Take 1 tablet (5 mg total) by mouth every 4 (four) hours as needed for moderate pain. 20 tablet 0  . Potassium Chloride ER 20 MEQ TBCR Take 40 mEq daily by mouth. 60 tablet 2  . sitaGLIPtin (JANUVIA) 100 MG tablet Take 1 tablet (100 mg total) by mouth daily. 90 tablet 0  . torsemide (DEMADEX) 20 MG tablet Take 20 mg by mouth 2 (two) times daily as needed (swelling).     . TRUEPLUS LANCETS 33G MISC 1 each by Does not apply route 3 (three) times daily. 600 each 2   No facility-administered medications prior to  visit.      Per HPI unless specifically indicated in ROS section below Review of Systems     Objective:    BP 126/68 (BP Location: Left Arm, Patient Position: Sitting, Cuff Size: Large)   Pulse 99   Temp 98.7 F (37.1 C) (Oral)   Ht 5\' 7"  (1.702 m)   Wt 233 lb 4 oz (105.8 kg)   SpO2 97%   BMI 36.53 kg/m   Wt Readings from Last 3 Encounters:  03/25/18 233 lb 4 oz (105.8 kg)  02/17/18 237 lb 10.5 oz (107.8 kg)  02/10/18 237 lb 4.8 oz (107.6 kg)    Physical Exam  Constitutional: She appears well-developed and well-nourished. No distress.  HENT:  Head: Normocephalic and atraumatic.  Right Ear: Hearing, tympanic membrane, external ear and ear  canal normal.  Left Ear: Hearing, tympanic membrane, external ear and ear canal normal.  Nose: Rhinorrhea present. No mucosal edema. Right sinus exhibits no maxillary sinus tenderness and no frontal sinus tenderness. Left sinus exhibits no maxillary sinus tenderness and no frontal sinus tenderness.  Mouth/Throat: Uvula is midline, oropharynx is clear and moist and mucous membranes are normal. No oropharyngeal exudate, posterior oropharyngeal edema, posterior oropharyngeal erythema or tonsillar abscesses.  Eyes: Pupils are equal, round, and reactive to light. EOM are normal. Right conjunctiva is injected. Left conjunctiva is injected. No scleral icterus.  Bulbar and palpebral conjunctival erythema with limbic sparing, mild discharge present bilateral eyes  Neck: Normal range of motion. Neck supple.  Cardiovascular: Normal rate, regular rhythm, normal heart sounds and intact distal pulses.  No murmur heard. Pulmonary/Chest: Effort normal and breath sounds normal. No respiratory distress. She has no wheezes. She has no rales.  Crackles bibasilarly  Lymphadenopathy:    She has no cervical adenopathy.  Skin: Skin is warm and dry. Rash noted. There is erythema.  Erythematous maculopapular rash LLE anterior shin, pruritic Hyperpigmented thickened macules BUE, not pruritic  Nursing note and vitals reviewed.     Assessment & Plan:   Problem List Items Addressed This Visit    Skin rash    2 separate rashes - active inflamed rash LLE of unclear etiology - pt states comes and goes. Rx triamcinolone cream BID and update if not improving for referral to derm. 2nd macular rash upper extremity ?post-inflammatory hyperpigmentation with residual skin thickening.       Viral conjunctivitis of both eyes - Primary    Anticipate viral given short duration and because it is improving without treatment. Supportive care reviewed - rec lubricating eye drops, warm compresses. Update if not improving with  treatment.          Meds ordered this encounter  Medications  . triamcinolone cream (KENALOG) 0.1 %    Sig: Apply 1 application topically 2 (two) times daily. Apply to AA.    Dispense:  80 g    Refill:  0   No orders of the defined types were placed in this encounter.   Follow up plan: Return if symptoms worsen or fail to improve.  Ria Bush, MD

## 2018-03-25 NOTE — Patient Instructions (Signed)
For rash - try triamcinolone cream sent to pharmacy. If no better, let us know for dermatology referral.  I think you have pink eye or viral conjunctivitis in setting of viral respiratory infection. This should get better with time. May use lubricating eye drops for eyes (over the counter like refresh or hypotears or systane).   Viral Conjunctivitis, Adult Viral conjunctivitis is an inflammation of the clear membrane that covers the white part of your eye and the inner surface of your eyelid (conjunctiva). The inflammation is caused by a viral infection. The blood vessels in the conjunctiva become inflamed, causing the eye to become red or pink, and often itchy. Viral conjunctivitis can be easily passed from one person to another (is contagious). This condition is often called pink eye. What are the causes? This condition is caused by a virus. A virus is a type of contagious germ. It can be spread by touching objects that have been contaminated with the virus, such as doorknobs or towels. It can also be passed through droplets, such as from coughing or sneezing. What are the signs or symptoms? Symptoms of this condition include:  Eye redness.  Tearing or watery eyes.  Itchy and irritated eyes.  Burning feeling in the eyes.  Clear drainage from the eye.  Swollen eyelids.  A gritty feeling in the eye.  Light sensitivity.  This condition often occurs with other symptoms, such as a fever, nausea, or a rash. How is this diagnosed? This condition is diagnosed with a medical history and physical exam. If you have discharge from your eye, the discharge may be tested to rule out other causes of conjunctivitis. How is this treated? Viral conjunctivitis does not respond to medicines that kill bacteria (antibiotics). Treatment for viral conjunctivitis is directed at stopping a bacterial infection from developing in addition to the viral infection. Treatment also aims to relieve your symptoms, such  as itching. This may be done with antihistamine drops or other eye medicines. Rarely, steroid eye drops or antiviral medicines may be prescribed. Follow these instructions at home: Medicines   Take or apply over-the-counter and prescription medicines only as told by your health care provider.  Be very careful to avoid touching the edge of the eyelid with the eye drop bottle or ointment tube when applying medicines to the affected eye. Being careful this way will stop you from spreading the infection to the other eye or to other people. Eye care  Avoid touching or rubbing your eyes.  Apply a warm, wet, clean washcloth to your eye for 10-20 minutes, 3-4 times per day or as told by your health care provider.  If you wear contact lenses, do not wear them until the inflammation is gone and your health care provider says it is safe to wear them again. Ask your health care provider how to sterilize or replace your contact lenses before using them again. Wear glasses until you can resume wearing contacts.  Avoid wearing eye makeup until the inflammation is gone. Throw away any old eye cosmetics that may be contaminated.  Gently wipe away any drainage from your eye with a warm, wet washcloth or a cotton ball. General instructions  Change or wash your pillowcase every day or as told by your health care provider.  Do not share towels, pillowcases, washcloths, eye makeup, makeup brushes, contact lenses, or glasses. This may spread the infection.  Wash your hands often with soap and water. Use paper towels to dry your hands. If soap and  water are not available, use hand sanitizer.  Try to avoid contact with other people for one week or as told by your health care provider. Contact a health care provider if:  Your symptoms do not improve with treatment or they get worse.  You have increased pain.  Your vision becomes blurry.  You have a fever.  You have facial pain, redness, or  swelling.  You have yellow or green drainage coming from your eye.  You have new symptoms. This information is not intended to replace advice given to you by your health care provider. Make sure you discuss any questions you have with your health care provider. Document Released: 02/06/2003 Document Revised: 06/13/2016 Document Reviewed: 06/02/2016 Elsevier Interactive Patient Education  [employer].

## 2018-03-25 NOTE — Assessment & Plan Note (Signed)
2 separate rashes - active inflamed rash LLE of unclear etiology - pt states comes and goes. Rx triamcinolone cream BID and update if not improving for referral to derm. 2nd macular rash upper extremity ?post-inflammatory hyperpigmentation with residual skin thickening.

## 2018-04-05 ENCOUNTER — Other Ambulatory Visit (INDEPENDENT_AMBULATORY_CARE_PROVIDER_SITE_OTHER): Payer: Medicare HMO

## 2018-04-05 DIAGNOSIS — E119 Type 2 diabetes mellitus without complications: Secondary | ICD-10-CM

## 2018-04-05 LAB — HEMOGLOBIN A1C: Hgb A1c MFr Bld: 8.8 % — ABNORMAL HIGH (ref 4.6–6.5)

## 2018-04-07 ENCOUNTER — Other Ambulatory Visit: Payer: Self-pay | Admitting: Internal Medicine

## 2018-04-07 MED ORDER — INSULIN GLARGINE 100 UNIT/ML SOLOSTAR PEN: 14.0000 [IU] | PEN_INJECTOR | Freq: Every day | SUBCUTANEOUS | 0 refills | Status: DC

## 2018-04-07 MED ORDER — PEN NEEDLES 32G X 5 MM MISC: 1.0000 | Freq: Every day | 2 refills | Status: DC

## 2018-04-07 NOTE — Addendum Note (Signed)
Addended by: Lurlean Nanny on: 04/07/2018 05:45 PM   Modules accepted: Orders

## 2018-04-08 ENCOUNTER — Other Ambulatory Visit: Payer: Self-pay | Admitting: Internal Medicine

## 2018-04-08 DIAGNOSIS — E1165 Type 2 diabetes mellitus with hyperglycemia: Secondary | ICD-10-CM

## 2018-04-08 MED ORDER — PEN NEEDLES 32G X 4 MM MISC: 1.0000 | Freq: Two times a day (BID) | 2 refills | Status: DC

## 2018-04-08 MED ORDER — BASAGLAR KWIKPEN 100 UNIT/ML ~~LOC~~ SOPN: 14.0000 [IU] | PEN_INJECTOR | Freq: Every day | SUBCUTANEOUS | 1 refills | Status: DC

## 2018-04-08 NOTE — Telephone Encounter (Signed)
Lantus not covered, can you change it to [place]? Please advise

## 2018-04-13 ENCOUNTER — Ambulatory Visit: Payer: Medicare HMO

## 2018-04-19 ENCOUNTER — Other Ambulatory Visit: Payer: Self-pay | Admitting: Family Medicine

## 2018-04-19 NOTE — Telephone Encounter (Signed)
Rx was given at 03/25/18 OV for skin rash of Lindsey Marquez pt. Did you want to refill this?

## 2018-04-21 NOTE — Telephone Encounter (Signed)
Will RF x1. Future refills to come from PCP

## 2018-04-26 ENCOUNTER — Other Ambulatory Visit: Payer: Self-pay

## 2018-04-26 DIAGNOSIS — C50112 Malignant neoplasm of central portion of left female breast: Secondary | ICD-10-CM

## 2018-04-26 MED ORDER — NERATINIB MALEATE 40 MG PO TABS: ORAL_TABLET | ORAL | 2 refills | Status: DC

## 2018-05-02 ENCOUNTER — Ambulatory Visit
Admission: RE | Admit: 2018-05-02 | Discharge: 2018-05-02 | Disposition: A | Discharge status: Home / Self Care | Payer: Medicare HMO | Source: Ambulatory Visit | Attending: Internal Medicine | Admitting: Internal Medicine

## 2018-05-02 DIAGNOSIS — Z1231 Encounter for screening mammogram for malignant neoplasm of breast: Secondary | ICD-10-CM | POA: Diagnosis not present

## 2018-05-13 ENCOUNTER — Inpatient Hospital Stay: Payer: Medicare HMO | Admitting: Hematology

## 2018-05-13 ENCOUNTER — Inpatient Hospital Stay: Payer: Medicare HMO | Attending: Adult Health

## 2018-05-13 DIAGNOSIS — R252 Cramp and spasm: Secondary | ICD-10-CM | POA: Insufficient documentation

## 2018-05-13 DIAGNOSIS — Z79811 Long term (current) use of aromatase inhibitors: Secondary | ICD-10-CM | POA: Insufficient documentation

## 2018-05-13 DIAGNOSIS — E119 Type 2 diabetes mellitus without complications: Secondary | ICD-10-CM | POA: Insufficient documentation

## 2018-05-13 DIAGNOSIS — C50112 Malignant neoplasm of central portion of left female breast: Secondary | ICD-10-CM | POA: Insufficient documentation

## 2018-05-13 DIAGNOSIS — N179 Acute kidney failure, unspecified: Secondary | ICD-10-CM | POA: Insufficient documentation

## 2018-05-13 DIAGNOSIS — I10 Essential (primary) hypertension: Secondary | ICD-10-CM | POA: Insufficient documentation

## 2018-05-13 DIAGNOSIS — K521 Toxic gastroenteritis and colitis: Secondary | ICD-10-CM | POA: Insufficient documentation

## 2018-05-13 DIAGNOSIS — M2550 Pain in unspecified joint: Secondary | ICD-10-CM | POA: Insufficient documentation

## 2018-05-13 DIAGNOSIS — E669 Obesity, unspecified: Secondary | ICD-10-CM | POA: Insufficient documentation

## 2018-05-13 DIAGNOSIS — D6481 Anemia due to antineoplastic chemotherapy: Secondary | ICD-10-CM | POA: Insufficient documentation

## 2018-05-13 DIAGNOSIS — Z170 Estrogen receptor positive status [ER+]: Secondary | ICD-10-CM | POA: Insufficient documentation

## 2018-05-17 ENCOUNTER — Telehealth: Payer: Self-pay | Admitting: Hematology

## 2018-05-17 NOTE — Telephone Encounter (Signed)
Patient called to reschedule  °

## 2018-05-18 ENCOUNTER — Inpatient Hospital Stay: Payer: Medicare HMO

## 2018-05-18 ENCOUNTER — Inpatient Hospital Stay: Payer: Medicare HMO | Admitting: Hematology

## 2018-05-19 NOTE — Progress Notes (Addendum)
[place]  [phone] Fax:([phone]) [phone]  Clinic Follow Up Note   Patient Care Team: Lindsey Fenton, NP as PCP - General (Internal Medicine) Lindsey Mesa, MD as Consulting Physician (General Surgery) Lindsey Merle, MD as Consulting Physician (Hematology) Lindsey Bison Charlestine Massed, NP as Nurse Practitioner (Hematology and Oncology)   Date of Service:  09/15/2018   CHIEF COMPLAINTS:  Follow up recurrent left breast cancer   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 [facility])   Staging form: Breast, AJCC 7th Edition     Clinical stage from 04/17/2016: Stage IA (T1c, N0, M0) - Signed by Lindsey Merle, MD on 05/07/2016     Pathologic stage from 05/28/2016: Stage IIA (T2, N0, cM0) - Signed by Lindsey Merle, MD on 06/11/2016 History of left breast cancer   Staging form: Breast, AJCC 7th Edition     Clinical: Stage IIIB (T4d, N1, cM0) - Signed by Lindsey Lark, MD on 12/14/2013     Pathologic: Stage IIIB (T4d, N1a, cM0) - Signed by Lindsey Lark, MD on 12/14/2013       History of left breast cancer (Resolved)   09/12/2002 Imaging    CT scan show no evidence of disease apart from the left axilla    09/2002 -  Neo-Adjuvant Chemotherapy    TAC X4 cycles     01/10/2003 Surgery    The patient had left lumpectomy and axillary lymph node dissection which show residual breast cancer and a sentinel lymph node was positive    2004 -  Adjuvant Chemotherapy    Cytoxan with 5-FU x4 cycles (methotrexate added at cycle 4).    2004 -  Radiation Therapy    Adjuvant left breast radiation    12/2002 Receptors her2    ER, PR and HER-2 were all negative.     Cancer of central portion of left breast ([place])   04/13/2016 Mammogram    Scheduler coarse heterogeneous calcifications spanning an area of 3.7 cm in the lumpectomy site, indeterminate. Ultrasound of the left axilla was negative.    04/17/2016 Initial Diagnosis    Cancer of central portion of left breast ([place])    04/17/2016 Initial Biopsy    Left breast core needle biopsy showed invasive and in situ ductal carcinoma with calcification, grade 2.    04/17/2016 Receptors her2    ER 100% positive, PR 15% positive, strong staining, Ki-67 10%, HER-2 positive by IHC (3)    05/05/2016 Imaging    Bilateral breast MRI showed a 1.3 x 0.8 x 0.7 cm biopsy-proven ductal carcinoma in the region of the previous lumpectomy. Enlarged lobulated right inferior axillary lymph node with diffuse cortical thickening, biopsy is recommended.    05/15/2016 Pathology Results    right axilary node biopsy was negative for malignant cell     05/28/2016 Surgery    Simple left mastectomy    05/28/2016 Pathology Results    Left mastectomy showed invasive ductal carcinoma, grade 3, 3.6 cm, high grade DCIS, (+) LVI, surgical margins were negative. Tumor 3.6 cm, no lymph nodes identified.    06/25/2016 Imaging    Staging CT scan of the chest, abdomen and pelvis with contrast and a bone scan showed no evidence of metastasis. Postoperative fluid collection in the left breast, likely a seroma or hematoma. Right axillary adenopathy, which was biopsied previously.     07/07/2016 - 07/15/2017 Chemotherapy    Adjuvant chemotherapy with docetaxel, carboplatin, Herceptin and pejeta every 3 weeks, for 6 cycles,  followed by Herceptin maintenance therapy for total of one year treatment  Ended [facility] 11/04/16      12/15/2016 -  Anti-estrogen oral therapy    Adjuvant letrozole 2.5 mg once daily    04/29/2017 Mammogram     Mammogram 04/29/2017 IMPRESSION: No mammographic evidence of malignancy. A result letter of this screening mammogram will be mailed directly to the patient.     08/26/2017 - 07/2018 Antibody Plan    She started Nerlynx 245m on 08/26/17, was held on 09/14/17 due to significant diarrhea and restarted when her diarrhea resolved on 09/21/17. Completed in 07/2018    09/22/2017 Procedure    Colonoscopy by Lindsey Marquez IMPRESSION: - One 3  mm polyp in the transverse colon, removed with a cold biopsy forceps. Resected and retrieved. - Non-bleeding internal hemorrhoids.    09/22/2017 Pathology Results    Diagnosis, colonoscopy  Surgical [P], transverse, polyp - TUBULAR ADENOMA(S). - HIGH GRADE DYSPLASIA IS NOT IDENTIFIED.       HISTORY OF PRESENTING ILLNESS: 05/08/16 Lindsey Cory89y.o. female is here because of her recurrent left breast cancer.  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 She was seen by multiple medical oncologist in our practice in the past, last was seen by Lindsey Marquez 11/2013.  She has been doing well, and is compliant with annual screening mammogram. The screening mammogram on 04/17/2016 showed calcification in the left breast, and core needle biopsy showed invasive and in situ ductal carcinoma, grade 2, ER/PR positive, HER-2 amplified. She was referred to seeing breast surgeon Dr. TGeorgette [place]and [place]   She feels well, she did not feel any lump in her breast or axillas lately. She denies any pain, dyspnea, or GI symptoms. She had right nipple rash and right axillary swelling in the past one week, after she drinks some diet tea with citric. Breast MRI showed an enlarged lobulated right inferior axillary lymph node with diffuse cortical thickening, in addition to the known left breast mass which measured 1.3 cm on MRI.  GYN HISTORY  Menarchal: 12 LMP: 2003 Contraceptive: 20 HRT: n/a  G2P2: 432yo Son and 375yo son who died   CURRENT THERAPY:   -Letrozole 2.5 mg daily, started on 12/15/2016 -Nerlynx to start 08/26/17 approximately, held on 09/14/17 due to significant side effects, restarted on week of 09/21/17, plan to complete after 1 year therapy    INTERIM HISTORY:  Lindsey Marquez for follow up for her left breast cancer. Since her last visit she had  abdominal hernia repair surgery on 02/17/18.  She presents to the clinic today by herself. She notes her surgery went well and she has 2 other hernias remaining but are not bothering her at currently. She is on insulin injections now. She notes her diarrhea is more controlled on Nerlynx now. She has Diarrhea every other day. She does not take anything for this. She is tolerating letrozole and Nerlynx well. She plans to follow up with her surgeon in July.    MEDICAL HISTORY:  Past Medical History:  Diagnosis Date  . Blood transfusion without reported diagnosis   . Breast cancer ([facility] August 2002   Invasive ductal carcinoma Left breast. 2002, 2017  . [place]   . Family history of malignant neoplasm of ovary   . Family history of pancreatic cancer   . Hypertension  SURGICAL HISTORY: Past Surgical History:  Procedure Laterality Date  . BREAST SURGERY Left 2003  . COLONOSCOPY    . FOOT SURGERY  2000  . INSERTION OF MESH N/A 02/17/2018   Procedure: INSERTION OF MESH;  Surgeon: Lindsey Mesa, MD;  Location: [place];  Service: General;  Laterality: N/A;  . MASTECTOMY Left 05/28/2016  . port a cath insertion  05/28/2016  . PORT-A-CATH REMOVAL Right 08/19/2017   Procedure: REMOVAL PORT-A-CATH;  Surgeon: Lindsey Mesa, MD;  Location: [place];  Service: General;  Laterality: Right;  . PORTACATH PLACEMENT Right 05/28/2016   Procedure: INSERTION PORT-A-CATH;  Surgeon: Lindsey Mesa, MD;  Location: [place];  Service: General;  Laterality: Right;  . TOTAL MASTECTOMY Left 05/28/2016   Procedure: LEFT  MASTECTOMY;  Surgeon: Lindsey Mesa, MD;  Location: [place];  Service: General;  Laterality: Left;  . TUBAL LIGATION  22 yrs since  2004.   Bilateral.  . VENTRAL HERNIA REPAIR  02/17/2018   open  . VENTRAL HERNIA REPAIR N/A 02/17/2018   Procedure: OPEN VENTRAL HERNIA REPAIR WITH MESH;  Surgeon: Lindsey Mesa, MD;  Location: [place];  Service: General;   Laterality: N/A;    SOCIAL HISTORY: Social History   Socioeconomic History  . Marital status: Divorced    Spouse name: Not on file  . Number of children: Not on file  . Years of education: Not on file  . Highest education level: Not on file  Occupational History  . Not on file  Social Needs  . Financial resource strain: Not on file  . Food insecurity:    Worry: Not on file    Inability: Not on file  . Transportation needs:    Medical: Not on file    Non-medical: Not on file  Tobacco Use  . Smoking status: Former Smoker    Packs/day: 0.10    Years: 7.00    Pack years: 0.70    Last attempt to quit: 12/01/1975    Years since quitting: 42.8  . Smokeless tobacco: Never Used  Substance and Sexual Activity  . Alcohol use: No    Alcohol/week: 0.0 standard drinks  . Drug use: No  . Sexual activity: Yes  Lifestyle  . Physical activity:    Days per week: Not on file    Minutes per session: Not on file  . Stress: Not on file  Relationships  . Social connections:    Talks on phone: Not on file    Gets together: Not on file    Attends religious service: Not on file    Active member of club or organization: Not on file    Attends meetings of clubs or organizations: Not on file    Relationship status: Not on file  . Intimate partner violence:    Fear of current or ex partner: Not on file    Emotionally abused: Not on file    Physically abused: Not on file    Forced sexual activity: Not on file  Other Topics Concern  . Not on file  Social History Narrative  . Not on file   She is retired from school   FAMILY HISTORY: Family History  Problem Relation Age of Onset  . Prostate cancer Father   . Ovarian cancer Maternal Aunt 40       deceased  . Pancreatic cancer Mother 2       deceased  . Cancer Maternal Aunt        2 other mat  aunts; unk. primary in 77s; deceased  . Cancer Maternal Uncle        "bone ca"; unk. primary; deceased 67s  . Prostate cancer Maternal Uncle         deceased 79  . Colon cancer Neg Hx   . Esophageal cancer Neg Hx   . Rectal cancer Neg Hx   . Stomach cancer Neg Hx     ALLERGIES:  is allergic to codeine.  MEDICATIONS:  Current Outpatient Medications  Medication Sig Dispense Refill  . aspirin 81 MG tablet Take 81 mg by mouth daily as needed (leg pain).     Marland Kitchen glucose blood (TRUE METRIX BLOOD GLUCOSE TEST) test strip 1 each by Other route 3 (three) times daily. Use as instructed 600 each 2  . ibuprofen (ADVIL,MOTRIN) 200 MG tablet Take 400 mg by mouth every 8 (eight) hours as needed for headache or moderate pain.     . Insulin Glargine (BASAGLAR KWIKPEN) 100 UNIT/ML SOPN Inject 0.14 mLs (14 Units total) into the skin at bedtime. 15 mL 1  . letrozole (FEMARA) 2.5 MG tablet Take 1 tablet (2.5 mg total) by mouth daily. 90 tablet 3  . lisinopril-hydrochlorothiazide (PRINZIDE,ZESTORETIC) 20-25 MG tablet Take 1 tablet daily by mouth. 90 tablet 1  . Omega-3 Fatty Acids (FISH OIL) 1200 MG CAPS Take 1,200 mg by mouth daily.     . ondansetron (ZOFRAN-ODT) 4 MG disintegrating tablet Take 1 tablet (4 mg total) by mouth every 8 (eight) hours as needed for nausea or vomiting. 20 tablet 1  . Potassium Chloride ER 20 MEQ TBCR Take 40 mEq daily by mouth. 60 tablet 2  . sitaGLIPtin (JANUVIA) 100 MG tablet Take 1 tablet (100 mg total) by mouth daily. 90 tablet 0  . torsemide (DEMADEX) 20 MG tablet Take 20 mg by mouth 2 (two) times daily as needed (swelling).     . triamcinolone cream (KENALOG) 0.1 % APPLY 1 APPLICATION TOPICALLY 2 (TWO) TIMES DAILY TO AFFECTED AREAS 80 g 0  . TRUEPLUS LANCETS 33G MISC 1 each by Does not apply route 3 (three) times daily. 600 each 2  . BD PEN NEEDLE NANO U/F 32G X 4 MM MISC USE 1 PEN NEEDLE 2 (TWO) TIMES DAILY. 200 each 1  . dapagliflozin propanediol (FARXIGA) 10 MG TABS tablet Take 10 mg by mouth daily. 30 tablet 5  . metFORMIN (GLUCOPHAGE) 1000 MG tablet Take 1 tablet (1,000 mg total) by mouth 2 (two) times daily with  a meal. 180 tablet 3  . Neratinib Maleate (NERLYNX) 40 MG tablet TAKE 6 TABLETS (240MG) BY MOUTH ONE TIME DAILY WITH FOOD. 180 tablet 2   No current facility-administered medications for this visit.     REVIEW OF SYSTEMS:   Constitutional: Denies fevers, chills or abnormal night sweats Eyes: Denies blurriness of vision, double vision or watery eyes Ears, nose, mouth, throat, and face: Denies mucositis or sore throat   Respiratory: Denies cough, dyspnea or wheezes Cardiovascular: Denies palpitation, chest discomfort Gastrointestinal:  Denies nausea, heartburn (+) controlled diarrhea  Skin: Denies abnormal skin rashes  Lymphatics: Denies new lymphadenopathy or easy bruising Neurological:Denies numbness  MSK: (+) cramping in leg (+) joint aches  Behavioral/Psych: Mood is stable, no new changes  All other systems were reviewed with the patient and are negative.  PHYSICAL EXAMINATION:  ECOG PERFORMANCE STATUS: 1  Vitals:   05/20/18 1332  BP: (!) 145/68  Pulse: 86  Resp: 17  Temp: 98.7 F (37.1 C)  SpO2: 100%  Filed Weights   05/20/18 1332  Weight: 239 lb 4.8 oz (108.5 kg)    GENERAL:alert, no distress and comfortable SKIN: skin color, texture, turgor are normal, no rashes or significant lesions EYES: normal, conjunctiva are pink and non-injected, sclera clear OROPHARYNX:no exudate, no erythema and lips, buccal mucosa, and tongue normal  NECK: supple, thyroid normal size, non-tender, without nodularity LYMPH:  no palpable lymphadenopathy in the cervical, axillary or inguinal LUNGS: clear to auscultation and percussion with normal breathing effort HEART: regular rate & rhythm and no murmurs  ABDOMEN:abdomen soft, non-tender and normal bowel sounds (+) S/p hernia repair: umbilical surgical incision healed well  Musculoskeletal:no cyanosis of digits and no clubbing  PSYCH: alert & oriented x 3 with fluent speech NEURO: no focal motor/sensory deficits,  Breasts: Breast  inspection (+) S/p left mastectomy: left breast is surgically absent, surgical incision has healed well. Soft tissue fullness around incision . No bilateral palpable mass or adenopathy in breast or axilla.   LABORATORY DATA:  I have reviewed the data as listed CBC Latest Ref Rng & Units 05/20/2018 02/10/2018 02/02/2018  WBC 3.9 - 10.3 K/uL 7.9 8.1 8.2  Hemoglobin 11.6 - 15.9 g/dL 11.2(L) 10.3(L) 10.9(L)  Hematocrit 34.8 - 46.6 % 33.7(L) 30.8(L) 32.2(L)  Platelets 145 - 400 K/uL 280 327 309   CMP Latest Ref Rng & Units 05/20/2018 02/10/2018 02/02/2018  Glucose 70 - 140 mg/dL 141(H) 216(H) 106  BUN 7 - 26 mg/dL 15 19 35(H)  Creatinine 0.60 - 1.10 mg/dL 0.99 1.02(H) 1.50(H)  Sodium 136 - 145 mmol/L 138 137 138  Potassium 3.5 - 5.1 mmol/L 4.4 4.2 4.8  Chloride 98 - 109 mmol/L 105 107 104  CO2 22 - 29 mmol/L 26 20(L) 24  Calcium 8.4 - 10.4 mg/dL 9.4 9.1 9.8  Total Protein 6.4 - 8.3 g/dL 7.5 - 8.0  Total Bilirubin 0.2 - 1.2 mg/dL 0.4 - 0.4  Alkaline Phos 40 - 150 U/L 78 - 77  AST 5 - 34 U/L 32 - 27  ALT 0 - 55 U/L 34 - 28    PATHOLOGY REPORT  Diagnosis 04/17/2016 Breast, left, needle core biopsy, inner mid breast - INVASIVE DUCTAL CARCINOMA WITH CALCIFICATIONS, SEE COMMENT. - DUCTAL CARCINOMA IN SITU WITH COMEDONECROSIS. Microscopic Comment While grading is best performed on the resection specimen the invasive carcinoma appears grade 2. Prognostic markers will be ordered and reported in an addendum. Dr. Lyndon Code has reviewed the case. The case was called to [facility] on 04/20/2016. Results: HER2 - *EQUIVOCAL* (Her2 by IHC will be ordered.) RATIO OF HER2/CEP17 SIGNALS 1.73 AVERAGE HER2 COPY NUMBER PER CELL 4.15 By immunohistochemistry, the tumor cells are positive for Her2 (3). Results: IMMUNOHISTOCHEMICAL AND MORPHOMETRIC ANALYSIS PERFORMED MANUALLY Estrogen Receptor: 100%, POSITIVE, STRONG STAINING INTENSITY Progesterone Receptor: 15%, POSITIVE, STRONG STAINING  INTENSITY Proliferation Marker Ki67: 10%  Diagnosis 05/15/2016 Lymph node, needle/core biopsy, right axilla - BENIGN REACTIVE LYMPH NODE. - NO GRANULOMAS OR MALIGNANCY IDENTIFIED. - SEE COMMENT. Microscopic Comment Internal departmental review obtained (Dr. Lyndon Code) with agreement. Results are phoned to [facility] (05/18/16). (MEG:gt, 05/18/16)  Breast, simple mastectomy, Left 05/28/2016 - INVASIVE DUCTAL CARCINOMA, GRADE III/III, SPANNING 3.6 CM. - DUCTAL CARCINOMA IN SITU, HIGH GRADE. - LYMPHOVASCULAR INVASION IS IDENTIFIED. - THE SURGICAL RESECTION MARGINS ARE NEGATIVE FOR CARCINOMA. - SEE ONCOLOGY TABLE BELOW. Microscopic Comment BREAST, INVASIVE TUMOR, WITHOUT LYMPH NODES PRESENT Specimen, including laterality : Left breast Procedure: Simple mastectomy Histologic type: Ductal with micropapillary features  Grade: III Tubule formation: 3 Nuclear pleomorphism: 2 Mitotic: 3 Tumor size (gross measurement): 3.6 cm Margins: Greater than 0.2 cm to all margins Lymphovascular invasion: Present Ductal carcinoma in situ: Present Grade: High grade Extensive intraductal component: Yes Lobular neoplasia: Not identified Tumor focality: Unifocal Treatment effect: N/A Extent of tumor: Confined to breast parenchyma. Breast prognostic profile: [phone] Estrogen receptor: 100%, strong staining intensity Progesterone receptor: 15%, strong staining intensity Her 2 neu: Amplification was detected by immunohistochemistry. Ki-67: 10% 1 of 2 FINAL for Lindsey Marquez, Lindsey Marquez ([id number]) Microscopic Comment(continued) Non-neoplastic breast: No significant findings. TNM: pT2, pNX (clinically recurrent). Comments: In addition to the main tumor, there is ductal carcinoma in situ present in random tissue submitted from the inferior lateral quadrant. (JBK:kh 06-01-16)  PROCEDURES  Colonoscopy 09/22/17 IMPRESSION: - One 3 mm polyp in the transverse colon, removed with a  cold biopsy forceps. Resected and retrieved. - Non-bleeding internal hemorrhoids.   ECHO 07/01/17 Study Conclusions - Left ventricle: The cavity size was normal. Systolic function was   normal. The estimated ejection fraction was in the range of 55%   to 60%. Wall motion was normal; there were no regional wall   motion abnormalities. Doppler parameters are consistent with   abnormal left ventricular relaxation (grade 1 diastolic   dysfunction). - Aortic valve: Transvalvular velocity was within the normal range.   There was no stenosis. There was no regurgitation. - Mitral valve: Calcified annulus. Transvalvular velocity was   within the normal range. There was no evidence for stenosis.   There was no regurgitation. - Left atrium: The atrium was mildly dilated. - Atrial septum: The septum bowed from left to right, consistent   with increased left atrial pressure. - Tricuspid valve: There was mild regurgitation. - Pulmonary arteries: Systolic pressure was within the normal   range. PA peak pressure: 24 mm Hg (S). - Global longitudinal strain -20.5% (normal).   RADIOGRAPHIC STUDIES: I have personally reviewed the radiological images as listed and agreed with the findings in the report. No results found.  Screening Mammogram 05/02/18 IMPRESSION: No mammographic evidence of malignancy. A result letter of this screening mammogram will be mailed directly to the patient. RECOMMENDATION: Screening mammogram in one year. (Code:SM-B-01Y)   Bone Density 04/29/2017 DualFemur Neck Right 04/29/2017    65.9         -0.8    0.928 g/cm2 AP Spine  L1-L4      04/29/2017    65.9         1.7     1.406 g/cm2  Mammogram 04/29/2017 IMPRESSION: No mammographic evidence of malignancy. A result letter of this screening mammogram will be mailed directly to the patient.  DG Chest 2 View 10/28/2016 IMPRESSION: No acute cardiopulmonary abnormality.  ASSESSMENT & PLAN:  67 y.o. African-American  female, postmenopausal, history of left breast triple negative cancer in 2003, presented with mammogram discovered left breast cancer, triple positive.  1. Cancer of central portion of left breast, pT2NxM0, stage IIA, G3, triple positive -I previously reviewed her surgical pathology findings in great details with patient and her family member. It showed a 3.6 cm mass, grade 3, surgical margins were negative. She had no sentinel lymph node biopsy due to her prior history of axillary lymph node dissection. -Her restaging CT scan of bone scan showed no evidence of distant metastasis. -We previously discussed the risk of cancer recurrence of this complete surgical resection. We reviewed that HER-2 positive with cancers are more aggressive, with increased risk of metastasis.  -  she has completed adjuvant TCHP (docetaxel, carbotaxol, Herceptin, pejeta), and one year Herceptin therapy - her last echo was 06/2017, EF normal, stable -She had colonoscopy on 09/22/17 which showed benign polyps that were removed and internal hemorrhoids. Overall benign. -She started Nerlynx 282m on 08/26/17, was held on 09/14/17 due to significant diarrhea and restarted when her diarrhea resolved on 09/21/17. She is tolerating well overall, now has controlled diarrhea with Imodium and Lomotil as needed -She continues to tolerate Nerlynx well with controlled diarrhea, I discussed she will complete Nerlynx in 07/2018. She is tolerating Letrozole as well with manageable joint pain, will continue. -She is clinically doing well. Lab reviewed, she has mild anemia, and her AKI resolved and glucose improved to 141. Otherwise labs WNL. Her physical exam and her 04/2018 mammogram were unremarkable. There is no clinical concern for recurrence. -continue Nerlynx 240 mg and Letrozole daily -F/u in 4 months   2. Anemia  -Secondary to chemotherapy  -Her H/H was normal before chemo  -mild with Hg at 11.2 today (05/20/18)    3. DM and HTN   -She'll continue follow-up with her primary care physician -previously I strongly encouraged her to monitor her blood pressure and glucose at home, and consider increasing the dose of metformin and glyburide if needed -Her A1c worsened to 8.8 on 04/05/18 -her BP and blood glucose has been more controlled lately    4. Obesity  -I previously encouraged her to eat healthy and exercise regularly, she is willing to lose some weight after she completes chemotherapy.  5. Bone Health -We previously discussed that Letrozole can cause some bone weakening  -Her Bone density scan from 04/29/17 her Femur Neck Right T-score is -0.8. This patient is considered normal. -I encouraged her to take calcium and vitamin D. We'll monitor her Vit D levels. -Will repeat DEXA in 2020    6. Diarrhea  -Secondary to Neratinib  -Much improved, she has not needed Imodium lately.   7. AKI -This is likely secondary to her diarrhea from Neratinib -She currently drinks adequate amount of water a day.  -She has been taking diuretics as needed for her leg swelling daily.  -I advised her to hold Demadex for a week, then reduce dose.  -I encouraged her to elevate her feet when sitting  -Currently resolved, Cr at 0.99 today (05/20/18)  8. Joint pain and leg cramping  -secondary to letrozole  -I previously suggested she take her high dose Vitamin D and calcium  -Overall this is manageable for her, stable    Plan -Continue Nerlynx until the end of Sep.2019 and letrozole daily  -Lab and f/u in 4 months    All questions were answered. The patient knows to call the clinic with any problems, questions or concerns.  I spent 25 minutes counseling the patient face to face. The total time spent in the appointment was  30 minutes and more than 50% was on counseling.  IOneal Deputy am acting as scribe for YTruitt Merle MD.   I have reviewed the above documentation for accuracy and completeness, and I agree with the above.      YTruitt Merle MD 05/20/2018

## 2018-05-20 ENCOUNTER — Telehealth: Payer: Self-pay

## 2018-05-20 ENCOUNTER — Inpatient Hospital Stay (HOSPITAL_BASED_OUTPATIENT_CLINIC_OR_DEPARTMENT_OTHER): Payer: Medicare HMO | Admitting: Hematology

## 2018-05-20 ENCOUNTER — Inpatient Hospital Stay: Payer: Medicare HMO

## 2018-05-20 ENCOUNTER — Other Ambulatory Visit: Payer: Self-pay

## 2018-05-20 VITALS — BP 145/68 | HR 86 | Temp 98.7°F | Resp 17 | Ht 67.0 in | Wt 239.3 lb

## 2018-05-20 DIAGNOSIS — C50112 Malignant neoplasm of central portion of left female breast: Secondary | ICD-10-CM

## 2018-05-20 DIAGNOSIS — Z170 Estrogen receptor positive status [ER+]: Secondary | ICD-10-CM | POA: Diagnosis not present

## 2018-05-20 DIAGNOSIS — N179 Acute kidney failure, unspecified: Secondary | ICD-10-CM | POA: Diagnosis not present

## 2018-05-20 DIAGNOSIS — E119 Type 2 diabetes mellitus without complications: Secondary | ICD-10-CM | POA: Diagnosis not present

## 2018-05-20 DIAGNOSIS — Z79811 Long term (current) use of aromatase inhibitors: Secondary | ICD-10-CM | POA: Diagnosis not present

## 2018-05-20 DIAGNOSIS — M2550 Pain in unspecified joint: Secondary | ICD-10-CM | POA: Diagnosis not present

## 2018-05-20 DIAGNOSIS — R252 Cramp and spasm: Secondary | ICD-10-CM | POA: Diagnosis not present

## 2018-05-20 DIAGNOSIS — E669 Obesity, unspecified: Secondary | ICD-10-CM | POA: Diagnosis not present

## 2018-05-20 DIAGNOSIS — D6481 Anemia due to antineoplastic chemotherapy: Secondary | ICD-10-CM | POA: Diagnosis not present

## 2018-05-20 DIAGNOSIS — I10 Essential (primary) hypertension: Secondary | ICD-10-CM | POA: Diagnosis not present

## 2018-05-20 DIAGNOSIS — K521 Toxic gastroenteritis and colitis: Secondary | ICD-10-CM | POA: Diagnosis not present

## 2018-05-20 LAB — COMPREHENSIVE METABOLIC PANEL
ALT: 34 U/L (ref 0–55)
AST: 32 U/L (ref 5–34)
Albumin: 3.9 g/dL (ref 3.5–5.0)
Alkaline Phosphatase: 78 U/L (ref 40–150)
Anion gap: 7 (ref 3–11)
BUN: 15 mg/dL (ref 7–26)
CO2: 26 mmol/L (ref 22–29)
Calcium: 9.4 mg/dL (ref 8.4–10.4)
Chloride: 105 mmol/L (ref 98–109)
Creatinine, Ser: 0.99 mg/dL (ref 0.60–1.10)
GFR calc Af Amer: >60 mL/min (ref >60)
GFR calc non Af Amer: 58 mL/min — ABNORMAL LOW (ref >60)
Glucose, Bld: 141 mg/dL — ABNORMAL HIGH (ref 70–140)
Potassium: 4.4 mmol/L (ref 3.5–5.1)
Sodium: 138 mmol/L (ref 136–145)
Total Bilirubin: 0.4 mg/dL (ref 0.2–1.2)
Total Protein: 7.5 g/dL (ref 6.4–8.3)

## 2018-05-20 LAB — CBC WITH DIFFERENTIAL/PLATELET
Basophils Absolute: 0 10*3/uL (ref 0.0–0.1)
Basophils Relative: 0 %
Eosinophils Absolute: 0.2 10*3/uL (ref 0.0–0.5)
Eosinophils Relative: 3 %
HCT: 33.7 % — ABNORMAL LOW (ref 34.8–46.6)
Hemoglobin: 11.2 g/dL — ABNORMAL LOW (ref 11.6–15.9)
Lymphocytes Relative: 29 %
Lymphs Abs: 2.3 10*3/uL (ref 0.9–3.3)
MCH: 33.6 pg (ref 25.1–34.0)
MCHC: 33.2 g/dL (ref 31.5–36.0)
MCV: 101.2 fL — ABNORMAL HIGH (ref 79.5–101.0)
Monocytes Absolute: 0.4 10*3/uL (ref 0.1–0.9)
Monocytes Relative: 5 %
Neutro Abs: 5 10*3/uL (ref 1.5–6.5)
Neutrophils Relative %: 63 %
Platelets: 280 10*3/uL (ref 145–400)
RBC: 3.33 MIL/uL — ABNORMAL LOW (ref 3.70–5.45)
RDW: 13.5 % (ref 11.2–14.5)
WBC: 7.9 10*3/uL (ref 3.9–10.3)

## 2018-05-20 MED ORDER — NERATINIB MALEATE 40 MG PO TABS: ORAL_TABLET | ORAL | 2 refills | Status: DC

## 2018-05-20 NOTE — Progress Notes (Signed)
Faxed script for Nerlynx to [facility].

## 2018-05-20 NOTE — Telephone Encounter (Signed)
Printed avs and calender of upcoming appointment. Per 6/21 los

## 2018-05-22 ENCOUNTER — Encounter: Payer: Self-pay | Admitting: Hematology

## 2018-06-03 ENCOUNTER — Encounter: Payer: Self-pay | Admitting: Internal Medicine

## 2018-06-03 ENCOUNTER — Ambulatory Visit (INDEPENDENT_AMBULATORY_CARE_PROVIDER_SITE_OTHER): Payer: Medicare HMO | Admitting: Internal Medicine

## 2018-06-03 VITALS — BP 170/84 | HR 91 | Ht 67.0 in | Wt 237.4 lb

## 2018-06-03 DIAGNOSIS — Z794 Long term (current) use of insulin: Secondary | ICD-10-CM

## 2018-06-03 DIAGNOSIS — E1165 Type 2 diabetes mellitus with hyperglycemia: Secondary | ICD-10-CM

## 2018-06-03 MED ORDER — DAPAGLIFLOZIN PROPANEDIOL 10 MG PO TABS: 10.0000 mg | ORAL_TABLET | Freq: Every day | ORAL | 5 refills | Status: DC

## 2018-06-03 MED ORDER — METFORMIN HCL 1000 MG PO TABS: 1000.0000 mg | ORAL_TABLET | Freq: Two times a day (BID) | ORAL | 3 refills | Status: DC

## 2018-06-03 NOTE — Patient Instructions (Addendum)
Please continue: - Basaglar 14 units at bedtime.  Stop GlucoVance and start: - Metformin 1000 mg 2x a day with meals  Stop Januvia for now.  Start: - Farxiga 10 mg daily before b'fast  Please let me know in 2 weeks if the sugars start to increase.  Please come back for a follow-up appointment in 3 months.  PATIENT INSTRUCTIONS FOR TYPE 2 DIABETES:  **Please join MyChart!** - see attached instructions about how to join if you have not done so already.  DIET AND EXERCISE Diet and exercise is an important part of diabetic treatment.  We recommended aerobic exercise in the form of brisk walking (working between 40-60% of maximal aerobic capacity, similar to brisk walking) for 150 minutes per week (such as 30 minutes five days per week) along with 3 times per week performing 'resistance' training (using various gauge rubber tubes with handles) 5-10 exercises involving the major muscle groups (upper body, lower body and core) performing 10-15 repetitions (or near fatigue) each exercise. Start at half the above goal but build slowly to reach the above goals. If limited by weight, joint pain, or disability, we recommend daily walking in a swimming pool with water up to waist to reduce pressure from joints while allow for adequate exercise.    BLOOD GLUCOSES Monitoring your blood glucoses is important for continued management of your diabetes. Please check your blood glucoses 2-4 times a day: fasting, before meals and at bedtime (you can rotate these measurements - e.g. one day check before the 3 meals, the next day check before 2 of the meals and before bedtime, etc.).   HYPOGLYCEMIA (low blood sugar) Hypoglycemia is usually a reaction to not eating, exercising, or taking too much insulin/ other diabetes drugs.  Symptoms include tremors, sweating, hunger, confusion, headache, etc. Treat IMMEDIATELY with 15 grams of Carbs: . 4 glucose tablets .  cup regular juice/soda . 2 tablespoons  raisins . 4 teaspoons sugar . 1 tablespoon honey Recheck blood glucose in 15 mins and repeat above if still symptomatic/blood glucose <100.  RECOMMENDATIONS TO REDUCE YOUR RISK OF DIABETIC COMPLICATIONS: * Take your prescribed MEDICATION(S) * Follow a DIABETIC diet: Complex carbs, fiber rich foods, (monounsaturated and polyunsaturated) fats * AVOID saturated/trans fats, high fat foods, >2,300 mg salt per day. * EXERCISE at least 5 times a week for 30 minutes or preferably daily.  * DO NOT SMOKE OR DRINK more than 1 drink a day. * Check your FEET every day. Do not wear tightfitting shoes. Contact us if you develop an ulcer * See your EYE doctor once a year or more if needed * Get a FLU shot once a year * Get a PNEUMONIA vaccine once before and once after age 75 years  GOALS:  * Your Hemoglobin A1c of <7%  * fasting sugars need to be <130 * after meals sugars need to be <180 (2h after you start eating) * Your Systolic BP should be 094 or lower  * Your Diastolic BP should be 80 or lower  * Your HDL (Good Cholesterol) should be 40 or higher  * Your LDL (Bad Cholesterol) should be 100 or lower. * Your Triglycerides should be 150 or lower  * Your Urine microalbumin (kidney function) should be <30 * Your Body Mass Index should be 25 or lower    Please consider the following ways to cut down carbs and fat and increase fiber and micronutrients in your diet: - substitute whole grain for white bread or pasta -  substitute brown rice for white rice - substitute 90-calorie flat bread pieces for slices of bread when possible - substitute sweet potatoes or yams for white potatoes - substitute humus for margarine - substitute tofu for cheese when possible - substitute almond or rice milk for regular milk (would not drink soy milk daily due to concern for soy estrogen influence on breast cancer risk) - substitute dark chocolate for other sweets when possible - substitute water - can add lemon or  orange slices for taste - for diet sodas (artificial sweeteners will trick your body that you can eat sweets without getting calories and will lead you to overeating and weight gain in the long run) - do not skip breakfast or other meals (this will slow down the metabolism and will result in more weight gain over time)  - can try smoothies made from fruit and almond/rice milk in am instead of regular breakfast - can also try old-fashioned (not instant) oatmeal made with almond/rice milk in am - order the dressing on the side when eating salad at a restaurant (pour less than half of the dressing on the salad) - eat as little meat as possible - can try juicing, but should not forget that juicing will get rid of the fiber, so would alternate with eating raw veg./fruits or drinking smoothies - use as little oil as possible, even when using olive oil - can dress a salad with a mix of balsamic vinegar and lemon juice, for e.g. - use agave nectar, stevia sugar, or regular sugar rather than artificial sweateners - steam or broil/roast veggies  - snack on veggies/fruit/nuts (unsalted, preferably) when possible, rather than processed foods - reduce or eliminate aspartame in diet (it is in diet sodas, chewing gum, etc) Read the labels!  Try to read Dr. Janene Harvey book: "Program for Reversing Diabetes" for other ideas for healthy eating.

## 2018-06-03 NOTE — Progress Notes (Signed)
Patient ID: Lindsey Marquez, female   DOB: October 22, 1951, 67 y.o.   MRN: [id number]   HPI: Lindsey Marquez is a 67 y.o.-year-old female, referred by her PCP, Lindsey Fenton, NP, for management of DM2, dx in ~2001, insulin-dependent, uncontrolled, without long-term complications.  Last hemoglobin A1c was: Lab Results  Component Value Date   HGBA1C 8.8 (H) 04/05/2018   HGBA1C 7.5 (H) 02/10/2018   HGBA1C 7.4 (H) 01/04/2018   Pt is on a regimen of: - Basaglar 14 units at bedtime - Metformin-glyburide 03-999 mg 2x a day with meal - Januvia 100 mg daily in a.m.  Pt checks her sugars 2x  a day and they are: - am: 89-140 - 2h after b'fast: n/c - before lunch: n/c - 2h after lunch: n/c - before dinner: n/c - 2h after dinner: n/c - bedtime: 110-120s - nighttime: n/c Lowest sugar was 69; she has hypoglycemia awareness at 70.  Highest sugar was 240.  Glucometer: True metrix  Pt's meals are: - Breakfast:coffee, mc muffin, egg - Lunch: [place] sandwich - Dinner: baked chicken, veggies,dessert - apples, icecream - Snacks: yoghurt, pretzels, PB  - no CKD, last BUN/creatinine:  Lab Results  Component Value Date   BUN 15 05/20/2018   BUN 19 02/10/2018   CREATININE 0.99 05/20/2018   CREATININE 1.02 (H) 02/10/2018  On lisinopril 20. - + HL; last set of lipids: Lab Results  Component Value Date   CHOL 160 10/04/2017   HDL 42.50 10/04/2017   LDLCALC 91 09/24/2016   LDLDIRECT 95.0 10/04/2017   TRIG 203.0 (H) 10/04/2017   CHOLHDL 4 10/04/2017  On fish oil. - last eye exam was in 07/2017: No DR.  - no numbness and tingling in her feet.  She does have slight swelling and discoloration after her chemotherapy. + ASA 81.  Pt has FH of DM in brother, sister, father.  + h/o BrCA in 2013, recurrence in 2018. Now cancer free.   ROS: Constitutional: no weight gain/loss, no fatigue, no subjective hyperthermia/hypothermia Eyes: no blurry vision, no xerophthalmia ENT: no sore throat, no  nodules palpated in throat, no dysphagia/odynophagia, no hoarseness Cardiovascular: no CP/SOB/palpitations/leg swelling Respiratory: no cough/SOB Gastrointestinal: no N/V/D/C Musculoskeletal: + muscle/no joint aches Skin: no rashes Neurological: no tremors/numbness/tingling/dizziness Psychiatric: no depression/anxiety  Current Outpatient Medications on File Prior to Visit  Medication Sig  . aspirin 81 MG tablet Take 81 mg by mouth daily as needed (leg pain).   Marland Kitchen glucose blood (TRUE METRIX BLOOD GLUCOSE TEST) test strip 1 each by Other route 3 (three) times daily. Use as instructed  . ibuprofen (ADVIL,MOTRIN) 200 MG tablet Take 400 mg by mouth every 8 (eight) hours as needed for headache or moderate pain.   . Insulin Glargine (BASAGLAR KWIKPEN) 100 UNIT/ML SOPN Inject 0.14 mLs (14 Units total) into the skin at bedtime.  . Insulin Pen Needle (PEN NEEDLES) 32G X 4 MM MISC 1 each by Other route 2 (two) times daily.  Marland Kitchen letrozole (FEMARA) 2.5 MG tablet Take 1 tablet (2.5 mg total) by mouth daily.  Marland Kitchen lisinopril-hydrochlorothiazide (PRINZIDE,ZESTORETIC) 20-25 MG tablet Take 1 tablet daily by mouth.  . Neratinib Maleate (NERLYNX) 40 MG tablet TAKE 6 TABLETS (240MG) BY MOUTH ONE TIME DAILY WITH FOOD.  Marland Kitchen Omega-3 Fatty Acids (FISH OIL) 1200 MG CAPS Take 1,200 mg by mouth daily.   . ondansetron (ZOFRAN-ODT) 4 MG disintegrating tablet Take 1 tablet (4 mg total) by mouth every 8 (eight) hours as needed for nausea or vomiting.  Marland Kitchen  Potassium Chloride ER 20 MEQ TBCR Take 40 mEq daily by mouth.  . sitaGLIPtin (JANUVIA) 100 MG tablet Take 1 tablet (100 mg total) by mouth daily.  Marland Kitchen torsemide (DEMADEX) 20 MG tablet Take 20 mg by mouth 2 (two) times daily as needed (swelling).   . triamcinolone cream (KENALOG) 0.1 % APPLY 1 APPLICATION TOPICALLY 2 (TWO) TIMES DAILY TO AFFECTED AREAS  . TRUEPLUS LANCETS 33G MISC 1 each by Does not apply route 3 (three) times daily.   No current facility-administered medications on  file prior to visit.   Also, Glucovance 03-999 milligrams twice a day before meals  Past Medical History:  Diagnosis Date  . Blood transfusion without reported diagnosis   . Breast cancer [facility]) August 2002   Invasive ductal carcinoma Left breast. 2002, 2017  . Diabetes mellitus without complication ([place])   . Family history of malignant neoplasm of ovary   . Family history of pancreatic cancer   . Hypertension    Past Surgical History:  Procedure Laterality Date  . BREAST SURGERY Left 2003  . COLONOSCOPY    . FOOT SURGERY  2000  . INSERTION OF MESH N/A 02/17/2018   Procedure: INSERTION OF MESH;  Surgeon: [name], MD;  Location: [place];  Service: General;  Laterality: N/A;  . MASTECTOMY Left 05/28/2016  . port a cath insertion  05/28/2016  . PORT-A-CATH REMOVAL Right 08/19/2017   Procedure: REMOVAL PORT-A-CATH;  Surgeon: [name], MD;  Location: [place];  Service: General;  Laterality: Right;  . PORTACATH PLACEMENT Right 05/28/2016   Procedure: INSERTION PORT-A-CATH;  Surgeon: [name], MD;  Location: [place];  Service: General;  Laterality: Right;  . TOTAL MASTECTOMY Left 05/28/2016   Procedure: LEFT  MASTECTOMY;  Surgeon: [name], MD;  Location: [place];  Service: General;  Laterality: Left;  . TUBAL LIGATION  22 yrs since  2004.   Bilateral.  . VENTRAL HERNIA REPAIR  02/17/2018   open  . VENTRAL HERNIA REPAIR N/A 02/17/2018   Procedure: OPEN VENTRAL HERNIA REPAIR WITH MESH;  Surgeon: [name], MD;  Location: [place];  Service: General;  Laterality: N/A;   Social History   Socioeconomic History  . Marital status: Divorced    Spouse name: Not on file  . Number of children: Not on file  . Years of education: Not on file  . Highest education level: Not on file  Occupational History  . Not on file  Social Needs  . Financial resource strain: Not on file  . Food insecurity:    Worry: Not on file    Inability: Not on file  .  Transportation needs:    Medical: Not on file    Non-medical: Not on file  Tobacco Use  . Smoking status: Former Smoker    Packs/day: 0.10    Years: 7.00    Pack years: 0.70    Last attempt to quit: 12/01/1975    Years since quitting: 42.5  . Smokeless tobacco: Never Used  Substance and Sexual Activity  . Alcohol use: No    Alcohol/week: 0.0 oz  . Drug use: No  . Sexual activity: Yes  Lifestyle  . Physical activity:    Days per week: Not on file    Minutes per session: Not on file  . Stress: Not on file  Relationships  . Social connections:    Talks on phone: Not on file    Gets together: Not on file    Attends religious  service: Not on file    Active member of club or organization: Not on file    Attends meetings of clubs or organizations: Not on file    Relationship status: Not on file  . Intimate partner violence:    Fear of current or ex partner: Not on file    Emotionally abused: Not on file    Physically abused: Not on file    Forced sexual activity: Not on file  Other Topics Concern  . Not on file  Social History Narrative  . Not on file    Allergies  Allergen Reactions  . Codeine Other (See Comments)    "get crazy", "see things that aren't there"   Family History  Problem Relation Age of Onset  . Prostate cancer Father   . Ovarian cancer Maternal Aunt 60       deceased  . Pancreatic cancer Mother 80       deceased  . Cancer Maternal Aunt        2 other mat aunts; unk. primary in 15s; deceased  . Cancer Maternal Uncle        "bone ca"; unk. primary; deceased 19s  . Prostate cancer Maternal Uncle        deceased 5  . Colon cancer Neg Hx   . Esophageal cancer Neg Hx   . Rectal cancer Neg Hx   . Stomach cancer Neg Hx     PE: BP (!) 170/84   Pulse 91   Ht 5' 7"  (1.702 m)   Wt 237 lb 6.4 oz (107.7 kg)   SpO2 96%   BMI 37.18 kg/m  Wt Readings from Last 3 Encounters:  05/20/18 239 lb 4.8 oz (108.5 kg)  03/25/18 233 lb 4 oz (105.8 kg)  02/17/18  237 lb 10.5 oz (107.8 kg)   Constitutional: overweight, in NAD Eyes: PERRLA, EOMI, no exophthalmos ENT: moist mucous membranes, no thyromegaly, no cervical lymphadenopathy Cardiovascular: RRR, No MRG, + slight peri-ankle swelling bilaterally Respiratory: CTA B Gastrointestinal: abdomen soft, NT, ND, BS+ Musculoskeletal: no deformities, strength intact in all 4 Skin: moist, warm, no rashes Neurological: no tremor with outstretched hands, DTR normal in all 4  ASSESSMENT: 1. DM2, insulin-dependent, uncontrolled, without long-term complications, but with hyperglycemia  PLAN:  1. Patient with long-standing, uncontrolled diabetes, on oral antidiabetic regimen and newly added basal insulin, with improvement of control.  Her latest HbA1c has increased to 8.8% 2 months ago, after which insulin was added.  It is not time yet to checK another HbA1c, but reviewing her sugars at home, these have improved. However, she describes occasional low blood sugars especially if she delays or skips meals.  We discussed that the most likely culprit for this is Glucovance, especially the glyburide part.  I explained that this predisposes her to hypoglycemia and it is not improving her cardiovascular status.  Therefore, I suggested adding an SGLT2 inhibitor instead. We discussed about benefits and possible SEs of  this class of drugs, which are: dizziness (advised to be careful when stands from sitting position), decreased BP - usually not < normal (BP today is not low), and fungal UTIs (advised to let me know if develops one).  - We will switch from Glucovance to metformin only, and, since we are starting Wilder Glade (which appears to be covered by her insurance), we will hold Januvia for now.  I advised her to let me know in 2 weeks how her sugars are doing and we may need to add back  Januvia for now.  We will also continue insulin for now.  It is conceivable that she is insulin deficient since she has been a diabetic for  almost 20 years.  However, we may need to check her insulin production in the near future. - We discussed about the diabetes support group in the [facility] and nutrition center upstairs and she is interested in this.  I gave her the telephone number and further information about the group meetings. - I suggested to:  Patient Instructions  Please continue: - Basaglar 14 units at bedtime.  Stop GlucoVance and start: - Metformin 1000 mg 2x a day with meals  Stop Januvia for now.  Start: - Farxiga 10 mg daily before b'fast  Please let me know in 2 weeks if the sugars start to increase.  Please come back for a follow-up appointment in 3 months.  - Strongly advised her to start checking sugars at different times of the day - check once a day, rotating checks - given sugar log and advised how to fill it and to bring it at next appt  - given foot care handout and explained the principles  - given instructions for hypoglycemia management "15-15 rule"  - advised for yearly eye exams  - Return to clinic in 3 mo with sugar log   Philemon Kingdom, MD PhD [facility] Endocrinology

## 2018-06-06 ENCOUNTER — Telehealth: Payer: Self-pay | Admitting: Pharmacist

## 2018-06-06 NOTE — Telephone Encounter (Signed)
Oral Oncology Pharmacist Encounter  Received notification from [facility] that they had received referral from patient to try to obtain Nerlynx (neratinib) through compassionate use program through the manufacturer.  Patient had been receiving her Nerlynx at [facility]. She had previously had copayment grant at that was helping to cover any out-of-pocket expense for Nerlynx. Her copayment grant had been depleted and patient could no longer afford Nerlynx. She is planned to stay on Nerlynx through October of this year.  Copayment foundation grants for breast cancer are now available. I was successful in obtaining [employer] for patient. This information has been shared with [facility] (phoned in and faxed).  I called and updated patient about copayment grant and that I had shared this info with [facility]. Patient instructed that the pharmacy will reach out to her at time of next refill. They will bill any copayment due against her copayment grant.  Patient expressed understanding and appreciation.  Patient knows to call the office with any additional questions or concerns.  Johny Drilling, PharmD, BCPS, BCOP  06/06/2018 9:45 AM Oral Oncology Clinic [phone]

## 2018-06-06 NOTE — Telephone Encounter (Addendum)
Oral Oncology Pharmacist Encounter  Successfully enrolled patient for copayment assistance funds from [facility] from the [place] amount: up to $15,000 Effective dates: 05/04/2018 - 05/04/2019 ID: [id number] BIN: [id number] Group: [id number] PCN: [id number]  Billing information will be faxed to [facility].   Johny Drilling, PharmD, BCPS, BCOP 06/06/2018 8:24 AM Oral Oncology Clinic [phone]

## 2018-07-12 DIAGNOSIS — Z9012 Acquired absence of left breast and nipple: Secondary | ICD-10-CM | POA: Diagnosis not present

## 2018-07-14 ENCOUNTER — Other Ambulatory Visit: Payer: Self-pay | Admitting: Internal Medicine

## 2018-07-14 ENCOUNTER — Telehealth: Payer: Self-pay | Admitting: Hematology

## 2018-07-14 NOTE — Telephone Encounter (Signed)
Patient called to update her phone number

## 2018-07-14 NOTE — Telephone Encounter (Signed)
Patient left message wanting to provide office with new phone number of [phone]. Per patient the old number of [phone] should be removed. This information is already reflected in the Epic system.

## 2018-09-16 ENCOUNTER — Inpatient Hospital Stay: Payer: Medicare HMO | Admitting: Nurse Practitioner

## 2018-09-16 ENCOUNTER — Other Ambulatory Visit: Payer: Self-pay | Admitting: Hematology

## 2018-09-16 ENCOUNTER — Inpatient Hospital Stay: Payer: Medicare HMO | Attending: Adult Health

## 2018-09-16 DIAGNOSIS — C50112 Malignant neoplasm of central portion of left female breast: Secondary | ICD-10-CM

## 2018-09-19 ENCOUNTER — Telehealth: Payer: Self-pay | Admitting: Hematology

## 2018-09-19 NOTE — Telephone Encounter (Signed)
Spoke to pt regarding upcoming appts per 10/18 sch message

## 2018-09-20 ENCOUNTER — Ambulatory Visit (INDEPENDENT_AMBULATORY_CARE_PROVIDER_SITE_OTHER): Payer: Medicare HMO | Admitting: Internal Medicine

## 2018-09-20 VITALS — BP 142/86 | HR 92 | Ht 67.0 in | Wt 233.0 lb

## 2018-09-20 DIAGNOSIS — E1169 Type 2 diabetes mellitus with other specified complication: Secondary | ICD-10-CM | POA: Insufficient documentation

## 2018-09-20 DIAGNOSIS — E785 Hyperlipidemia, unspecified: Secondary | ICD-10-CM

## 2018-09-20 DIAGNOSIS — E669 Obesity, unspecified: Secondary | ICD-10-CM | POA: Diagnosis not present

## 2018-09-20 DIAGNOSIS — Z6833 Body mass index (BMI) 33.0-33.9, adult: Secondary | ICD-10-CM | POA: Insufficient documentation

## 2018-09-20 DIAGNOSIS — E6609 Other obesity due to excess calories: Secondary | ICD-10-CM | POA: Insufficient documentation

## 2018-09-20 DIAGNOSIS — E1165 Type 2 diabetes mellitus with hyperglycemia: Secondary | ICD-10-CM | POA: Diagnosis not present

## 2018-09-20 DIAGNOSIS — Z794 Long term (current) use of insulin: Secondary | ICD-10-CM | POA: Diagnosis not present

## 2018-09-20 LAB — POCT GLYCOSYLATED HEMOGLOBIN (HGB A1C): Hemoglobin A1C: 9.4 % — AB (ref 4.0–5.6)

## 2018-09-20 MED ORDER — SITAGLIPTIN PHOSPHATE 100 MG PO TABS: 100.0000 mg | ORAL_TABLET | Freq: Every day | ORAL | 3 refills | Status: DC

## 2018-09-20 NOTE — Progress Notes (Signed)
Will Patient ID: Lindsey Marquez, female   DOB: 1951/04/12, 67 y.o.   MRN: [id number]   HPI: Lindsey Marquez is a 67 y.o.-year-old female, returning for follow-up for DM2, dx in ~2001, insulin-dependent, uncontrolled, without long-term complications. Last visit 3.5 months ago.  Last hemoglobin A1c was: Lab Results  Component Value Date   HGBA1C 8.8 (H) 04/05/2018   HGBA1C 7.5 (H) 02/10/2018   HGBA1C 7.4 (H) 01/04/2018   Pt was on a regimen of: - Basaglar 14 units at bedtime - Metformin-glyburide 03-999 mg 2x a day with meal - Januvia 100 mg daily in a.m.  At last visit, we changed to: - Basaglar 14 units at bedtime. - Metformin 1000 mg 2x a day with meals - Farxiga 10 mg daily before b'fast  Pt checks her sugars twice a day: - am: 89-140 >> 117-130 - 2h after b'fast: n/c - before lunch: n/c - 2h after lunch: n/c - before dinner: n/c - 2h after dinner: n/c - bedtime: 110-120s >> 100, 117, 160-170 (candy, icecream) - nighttime: n/c Lowest sugar was 69 >> 100; she has hypoglycemia awareness in the 70s. Highest sugar was 240 >> 170.  Glucometer: True metrix  Pt's meals are: - Breakfast:coffee, mc muffin, egg - Lunch: [place] sandwich - Dinner: baked chicken, veggies,dessert - apples, icecream - Snacks: yoghurt, pretzels, PB  -No CKD, last BUN/creatinine:  Lab Results  Component Value Date   BUN 15 05/20/2018   BUN 19 02/10/2018   CREATININE 0.99 05/20/2018   CREATININE 1.02 (H) 02/10/2018  On lisinopril 20. -+ HL; last set of lipids: Lab Results  Component Value Date   CHOL 160 10/04/2017   HDL 42.50 10/04/2017   LDLCALC 91 09/24/2016   LDLDIRECT 95.0 10/04/2017   TRIG 203.0 (H) 10/04/2017   CHOLHDL 4 10/04/2017  On fish oil. - last eye exam was in 07/2017: No DR -No numbness and tingling in her feet.  She does have slight swelling and discoloration after her chemotherapy. She is on ASA 81.  Pt has FH of DM in brother, sister, father.  + h/o BrCA in 2013,  recurrence in 2018. Now cancer free.   ROS: Constitutional: no weight gain/no weight loss, no fatigue, no subjective hyperthermia, no subjective hypothermia Eyes: no blurry vision, no xerophthalmia ENT: no sore throat, no nodules palpated in neck, no dysphagia, no odynophagia, no hoarseness Cardiovascular: no CP/no SOB/no palpitations/no leg swelling Respiratory: no cough/no SOB/no wheezing Gastrointestinal: no N/no V/no D/no C/no acid reflux Musculoskeletal: no muscle aches/no joint aches Skin: no rashes, no hair loss Neurological: no tremors/no numbness/no tingling/no dizziness  I reviewed pt's medications, allergies, PMH, social hx, family hx, and changes were documented in the history of present illness. Otherwise, unchanged from my initial visit note.  Current Outpatient Medications on File Prior to Visit  Medication Sig  . aspirin 81 MG tablet Take 81 mg by mouth daily as needed (leg pain).   . BD PEN NEEDLE NANO U/F 32G X 4 MM MISC USE 1 PEN NEEDLE 2 (TWO) TIMES DAILY.  . dapagliflozin propanediol (FARXIGA) 10 MG TABS tablet Take 10 mg by mouth daily.  Marland Kitchen glucose blood (TRUE METRIX BLOOD GLUCOSE TEST) test strip 1 each by Other route 3 (three) times daily. Use as instructed  . ibuprofen (ADVIL,MOTRIN) 200 MG tablet Take 400 mg by mouth every 8 (eight) hours as needed for headache or moderate pain.   . Insulin Glargine (BASAGLAR KWIKPEN) 100 UNIT/ML SOPN Inject 0.14 mLs (14 Units total)  into the skin at bedtime.  Marland Kitchen letrozole (FEMARA) 2.5 MG tablet Take 1 tablet (2.5 mg total) by mouth daily.  Marland Kitchen lisinopril-hydrochlorothiazide (PRINZIDE,ZESTORETIC) 20-25 MG tablet Take 1 tablet daily by mouth.  . metFORMIN (GLUCOPHAGE) 1000 MG tablet Take 1 tablet (1,000 mg total) by mouth 2 (two) times daily with a meal.  . Neratinib Maleate (NERLYNX) 40 MG tablet TAKE 6 TABLETS (240MG) BY MOUTH ONE TIME DAILY WITH FOOD.  Marland Kitchen Omega-3 Fatty Acids (FISH OIL) 1200 MG CAPS Take 1,200 mg by mouth daily.   .  ondansetron (ZOFRAN-ODT) 4 MG disintegrating tablet Take 1 tablet (4 mg total) by mouth every 8 (eight) hours as needed for nausea or vomiting.  . Potassium Chloride ER 20 MEQ TBCR Take 40 mEq daily by mouth.  . sitaGLIPtin (JANUVIA) 100 MG tablet Take 1 tablet (100 mg total) by mouth daily.  Marland Kitchen torsemide (DEMADEX) 20 MG tablet Take 20 mg by mouth 2 (two) times daily as needed (swelling).   . triamcinolone cream (KENALOG) 0.1 % APPLY 1 APPLICATION TOPICALLY 2 (TWO) TIMES DAILY TO AFFECTED AREAS  . TRUEPLUS LANCETS 33G MISC 1 each by Does not apply route 3 (three) times daily.   No current facility-administered medications on file prior to visit.   Also, Glucovance 03-999 milligrams twice a day before meals  Past Medical History:  Diagnosis Date  . Blood transfusion without reported diagnosis   . Breast cancer [facility]) August 2002   Invasive ductal carcinoma Left breast. 2002, 2017  . Diabetes mellitus without complication ([place])   . Family history of malignant neoplasm of ovary   . Family history of pancreatic cancer   . Hypertension    Past Surgical History:  Procedure Laterality Date  . BREAST SURGERY Left 2003  . COLONOSCOPY    . FOOT SURGERY  2000  . INSERTION OF MESH N/A 02/17/2018   Procedure: INSERTION OF MESH;  Surgeon: [name], MD;  Location: [place];  Service: General;  Laterality: N/A;  . MASTECTOMY Left 05/28/2016  . port a cath insertion  05/28/2016  . PORT-A-CATH REMOVAL Right 08/19/2017   Procedure: REMOVAL PORT-A-CATH;  Surgeon: [name], MD;  Location: [place];  Service: General;  Laterality: Right;  . PORTACATH PLACEMENT Right 05/28/2016   Procedure: INSERTION PORT-A-CATH;  Surgeon: [name], MD;  Location: [place];  Service: General;  Laterality: Right;  . TOTAL MASTECTOMY Left 05/28/2016   Procedure: LEFT  MASTECTOMY;  Surgeon: [name], MD;  Location: [place];  Service: General;  Laterality: Left;  . TUBAL LIGATION  22 yrs since  2004.    Bilateral.  . VENTRAL HERNIA REPAIR  02/17/2018   open  . VENTRAL HERNIA REPAIR N/A 02/17/2018   Procedure: OPEN VENTRAL HERNIA REPAIR WITH MESH;  Surgeon: [name], MD;  Location: [place];  Service: General;  Laterality: N/A;   Social History   Socioeconomic History  . Marital status: Divorced    Spouse name: Not on file  . Number of children: Not on file  . Years of education: Not on file  . Highest education level: Not on file  Occupational History  . Not on file  Social Needs  . Financial resource strain: Not on file  . Food insecurity:    Worry: Not on file    Inability: Not on file  . Transportation needs:    Medical: Not on file    Non-medical: Not on file  Tobacco Use  . Smoking status: Former Smoker  Packs/day: 0.10    Years: 7.00    Pack years: 0.70    Last attempt to quit: 12/01/1975    Years since quitting: 42.8  . Smokeless tobacco: Never Used  Substance and Sexual Activity  . Alcohol use: No    Alcohol/week: 0.0 standard drinks  . Drug use: No  . Sexual activity: Yes  Lifestyle  . Physical activity:    Days per week: Not on file    Minutes per session: Not on file  . Stress: Not on file  Relationships  . Social connections:    Talks on phone: Not on file    Gets together: Not on file    Attends religious service: Not on file    Active member of club or organization: Not on file    Attends meetings of clubs or organizations: Not on file    Relationship status: Not on file  . Intimate partner violence:    Fear of current or ex partner: Not on file    Emotionally abused: Not on file    Physically abused: Not on file    Forced sexual activity: Not on file  Other Topics Concern  . Not on file  Social History Narrative  . Not on file    Allergies  Allergen Reactions  . Codeine Other (See Comments)    "get crazy", "see things that aren't there"   Family History  Problem Relation Age of Onset  . Prostate cancer Father   . Ovarian cancer  Maternal Aunt 53       deceased  . Pancreatic cancer Mother 40       deceased  . Cancer Maternal Aunt        2 other mat aunts; unk. primary in 50s; deceased  . Cancer Maternal Uncle        "bone ca"; unk. primary; deceased 3s  . Prostate cancer Maternal Uncle        deceased 46  . Colon cancer Neg Hx   . Esophageal cancer Neg Hx   . Rectal cancer Neg Hx   . Stomach cancer Neg Hx     PE: BP (!) 142/86   Pulse 92   Ht 5' 7"  (1.702 m) Comment: mesured  Wt 233 lb (105.7 kg)   SpO2 97%   BMI 36.49 kg/m  Wt Readings from Last 3 Encounters:  09/20/18 233 lb (105.7 kg)  06/03/18 237 lb 6.4 oz (107.7 kg)  05/20/18 239 lb 4.8 oz (108.5 kg)   Constitutional: overweight, in NAD Eyes: PERRLA, EOMI, no exophthalmos ENT: moist mucous membranes, no thyromegaly, no cervical lymphadenopathy Cardiovascular: RRR, No MRG Respiratory: CTA B Gastrointestinal: abdomen soft, NT, ND, BS+ Musculoskeletal: no deformities, strength intact in all 4 Skin: moist, warm, no rashes Neurological: no tremor with outstretched hands, DTR normal in all 4  ASSESSMENT: 1. DM2, insulin-dependent, uncontrolled, without long-term complications, but with hyperglycemia  2. HL  3.  Obesity  PLAN:  1. Patient with long-standing, uncontrolled, diabetes, on oral antidiabetic regimen and long-acting insulin.  Her sugars improved after addition of Basaglar but they were still above target.  She also occasionally had low blood sugars especially if she delayed or skipped a meal.  We discussed about switching from Glucovance to an SGLT2 inhibitor and metformin.  Since then, we started [place], which she tolerates very well. - her sugars are a little higher at night at this visit especially after sweets and snacks, but most of the sugars remain at goal -  will try to add back Januvia - I suggested to:  Patient Instructions  Please continue: - Basaglar 14 units at bedtime. - Metformin 1000 mg 2x a day with meals -  Farxiga 10 mg daily before b'fast  Please add back: - Januvia 100 mg before b'fast  Please come back for a follow-up appointment in 3 months.  - today, HbA1c is 9.4% (higher-?) - will check a fructosamine level - continue checking sugars at different times of the day - check 1x a day, rotating checks - advised for yearly eye exams >> she is UTD - will check lipids today - Return to clinic in 3 mo with sugar log   2. HL - Reviewed latest lipid panel from 09/2017: LDL lower than 100, triglycerides high Lab Results  Component Value Date   CHOL 160 10/04/2017   HDL 42.50 10/04/2017   LDLCALC 91 09/24/2016   LDLDIRECT 95.0 10/04/2017   TRIG 203.0 (H) 10/04/2017   CHOLHDL 4 10/04/2017  - Continues fish oil without side effects.  3.  Obesity - continue Wilder Glade which should also help with weight loss and also continue metformin, which is limiting weight loss long-term - lost 4 lbs since last visit  Addendum (10/04/2018): She did not stop at the lab to have the fructosamine checked.  Philemon Kingdom, MD PhD [facility] Endocrinology

## 2018-09-20 NOTE — Patient Instructions (Signed)
Please continue: - Basaglar 14 units at bedtime. - Metformin 1000 mg 2x a day with meals - Farxiga 10 mg daily before b'fast  Please add back: - Januvia 100 mg before b'fast  Please come back for a follow-up appointment in 3 months.

## 2018-09-29 ENCOUNTER — Ambulatory Visit (INDEPENDENT_AMBULATORY_CARE_PROVIDER_SITE_OTHER): Payer: Medicare HMO

## 2018-09-29 ENCOUNTER — Other Ambulatory Visit: Payer: Self-pay | Admitting: Internal Medicine

## 2018-09-29 DIAGNOSIS — Z23 Encounter for immunization: Secondary | ICD-10-CM | POA: Diagnosis not present

## 2018-10-03 NOTE — Progress Notes (Signed)
[place]  [phone] [phone]  Clinic Follow Up Note   Patient Care Team: [name], NP as PCP - General (Internal Medicine) [name], MD as Consulting Physician (General Surgery) [name], MD as Consulting Physician (Hematology) Delice Bison Charlestine Massed, NP as Nurse Practitioner (Hematology and Oncology)   Date of Service:  10/05/2018   CHIEF COMPLAINTS:  Follow up recurrent left breast cancer   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 [facility])   Staging form: Breast, AJCC 7th Edition     Clinical stage from 04/17/2016: Stage IA (T1c, N0, M0) - Signed by [name], MD on 05/07/2016     Pathologic stage from 05/28/2016: Stage IIA (T2, N0, cM0) - Signed by [name], MD on 06/11/2016 History of left breast cancer   Staging form: Breast, AJCC 7th Edition     Clinical: Stage IIIB (T4d, N1, cM0) - Signed by [name], MD on 12/14/2013     Pathologic: Stage IIIB (T4d, N1a, cM0) - Signed by [name], MD on 12/14/2013       History of left breast cancer (Resolved)   09/12/2002 Imaging    CT scan show no evidence of disease apart from the left axilla    09/2002 -  Neo-Adjuvant Chemotherapy    TAC X4 cycles     01/10/2003 Surgery    The patient had left lumpectomy and axillary lymph node dissection which show residual breast cancer and a sentinel lymph node was positive    2004 -  Adjuvant Chemotherapy    Cytoxan with 5-FU x4 cycles (methotrexate added at cycle 4).    2004 -  Radiation Therapy    Adjuvant left breast radiation    12/2002 Receptors her2    ER, PR and HER-2 were all negative.     Cancer of central portion of left breast ([place])   04/13/2016 Mammogram    Scheduler coarse heterogeneous calcifications spanning an area of 3.7 cm in the lumpectomy site, indeterminate. Ultrasound of the left axilla was negative.    04/17/2016 Initial Diagnosis    Cancer of central portion of left breast ([place])    04/17/2016 Initial Biopsy    Left breast core needle biopsy showed invasive and in situ ductal carcinoma with calcification, grade 2.    04/17/2016 Receptors her2    ER 100% positive, PR 15% positive, strong staining, Ki-67 10%, HER-2 positive by IHC (3)    05/05/2016 Imaging    Bilateral breast MRI showed a 1.3 x 0.8 x 0.7 cm biopsy-proven ductal carcinoma in the region of the previous lumpectomy. Enlarged lobulated right inferior axillary lymph node with diffuse cortical thickening, biopsy is recommended.    05/15/2016 Pathology Results    right axilary node biopsy was negative for malignant cell     05/28/2016 Surgery    Simple left mastectomy    05/28/2016 Pathology Results    Left mastectomy showed invasive ductal carcinoma, grade 3, 3.6 cm, high grade DCIS, (+) LVI, surgical margins were negative. Tumor 3.6 cm, no lymph nodes identified.    06/25/2016 Imaging    Staging CT scan of the chest, abdomen and pelvis with contrast and a bone scan showed no evidence of metastasis. Postoperative fluid collection in the left breast, likely a seroma or hematoma. Right axillary adenopathy, which was biopsied previously.     07/07/2016 - 07/15/2017 Chemotherapy    Adjuvant chemotherapy with docetaxel, carboplatin, Herceptin and pejeta every 3 weeks, for 6 cycles,  followed by Herceptin maintenance therapy for total of one year treatment  Ended [facility] 11/04/16      12/15/2016 -  Anti-estrogen oral therapy    Adjuvant letrozole 2.5 mg once daily    04/29/2017 Mammogram     Mammogram 04/29/2017 IMPRESSION: No mammographic evidence of malignancy. A result letter of this screening mammogram will be mailed directly to the patient.     08/26/2017 - 07/2018 Antibody Plan    She started Nerlynx 261m on 08/26/17, was held on 09/14/17 due to significant diarrhea and restarted when her diarrhea resolved on 09/21/17. Completed in 07/2018    09/22/2017 Procedure    Colonoscopy by Dr. NSilverio Decamp IMPRESSION: - One 3  mm polyp in the transverse colon, removed with a cold biopsy forceps. Resected and retrieved. - Non-bleeding internal hemorrhoids.    09/22/2017 Pathology Results    Diagnosis, colonoscopy  Surgical [P], transverse, polyp - TUBULAR ADENOMA(S). - HIGH GRADE DYSPLASIA IS NOT IDENTIFIED.       HISTORY OF PRESENTING ILLNESS: 05/08/16 GDiego Cory89y.o. female is here because of her recurrent left breast cancer.  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 She was seen by multiple medical oncologist in our practice in the past, last was seen by Dr. GAlvy Bimlerin 11/2013.  She has been doing well, and is compliant with annual screening mammogram. The screening mammogram on 04/17/2016 showed calcification in the left breast, and core needle biopsy showed invasive and in situ ductal carcinoma, grade 2, ER/PR positive, HER-2 amplified. She was referred to seeing breast surgeon Dr. TGeorgette [place]and [place]   She feels well, she did not feel any lump in her breast or axillas lately. She denies any pain, dyspnea, or GI symptoms. She had right nipple rash and right axillary swelling in the past one week, after she drinks some diet tea with citric. Breast MRI showed an enlarged lobulated right inferior axillary lymph node with diffuse cortical thickening, in addition to the known left breast mass which measured 1.3 cm on MRI.  GYN HISTORY  Menarchal: 12 LMP: 2003 Contraceptive: 20 HRT: n/a  G2P2: 457yo Son and 34yo son who died   CURRENT THERAPY:   -Letrozole 2.5 mg daily, started on 12/15/2016 -Nerlynx to start 08/26/17 approximately, held on 09/14/17 due to significant side effects, restarted on week of 09/21/17, completed on 08/2018    INTERIM HISTORY:  Ms. cmantellreturns for follow up for her left breast cancer. She has been following up with  Endocrinologist Dr. GCruzita Lederer Today, she is here alone. She is doing well and states that she didn't feel any different after stopping treatment. Her appetite is good and she is eating well. She tries to stay hydrated.  She checks her BG at home.    MEDICAL HISTORY:  Past Medical History:  Diagnosis Date  . Blood transfusion without reported diagnosis   . Breast cancer ([facility] August 2002   Invasive ductal carcinoma Left breast. 2002, 2017  . Diabetes mellitus without complication ([facility]   . Family history of malignant neoplasm of ovary   . Family history of pancreatic cancer   . Hypertension     SURGICAL HISTORY: Past Surgical History:  Procedure Laterality Date  . BREAST SURGERY Left 2003  . COLONOSCOPY    . FOOT SURGERY  2000  . INSERTION OF MESH N/A 02/17/2018   Procedure: INSERTION  OF MESH;  Surgeon: [name], MD;  Location: [place];  Service: General;  Laterality: N/A;  . MASTECTOMY Left 05/28/2016  . port a cath insertion  05/28/2016  . PORT-A-CATH REMOVAL Right 08/19/2017   Procedure: REMOVAL PORT-A-CATH;  Surgeon: [name], MD;  Location: [place];  Service: General;  Laterality: Right;  . PORTACATH PLACEMENT Right 05/28/2016   Procedure: INSERTION PORT-A-CATH;  Surgeon: [name], MD;  Location: [place];  Service: General;  Laterality: Right;  . TOTAL MASTECTOMY Left 05/28/2016   Procedure: LEFT  MASTECTOMY;  Surgeon: [name], MD;  Location: [place];  Service: General;  Laterality: Left;  . TUBAL LIGATION  22 yrs since  2004.   Bilateral.  . VENTRAL HERNIA REPAIR  02/17/2018   open  . VENTRAL HERNIA REPAIR N/A 02/17/2018   Procedure: OPEN VENTRAL HERNIA REPAIR WITH MESH;  Surgeon: [name], MD;  Location: [place];  Service: General;  Laterality: N/A;    SOCIAL HISTORY: Social History   Socioeconomic History  . Marital status: Divorced    Spouse name: Not on file  . Number of children: Not on file  . Years of education: Not on file    . Highest education level: Not on file  Occupational History  . Not on file  Social Needs  . Financial resource strain: Not on file  . Food insecurity:    Worry: Not on file    Inability: Not on file  . Transportation needs:    Medical: Not on file    Non-medical: Not on file  Tobacco Use  . Smoking status: Former Smoker    Packs/day: 0.10    Years: 7.00    Pack years: 0.70    Last attempt to quit: 12/01/1975    Years since quitting: 42.8  . Smokeless tobacco: Never Used  Substance and Sexual Activity  . Alcohol use: No    Alcohol/week: 0.0 standard drinks  . Drug use: No  . Sexual activity: Yes  Lifestyle  . Physical activity:    Days per week: Not on file    Minutes per session: Not on file  . Stress: Not on file  Relationships  . Social connections:    Talks on phone: Not on file    Gets together: Not on file    Attends religious service: Not on file    Active member of club or organization: Not on file    Attends meetings of clubs or organizations: Not on file    Relationship status: Not on file  . Intimate partner violence:    Fear of current or ex partner: Not on file    Emotionally abused: Not on file    Physically abused: Not on file    Forced sexual activity: Not on file  Other Topics Concern  . Not on file  Social History Narrative  . Not on file   She is retired from school   FAMILY HISTORY: Family History  Problem Relation Age of Onset  . Prostate cancer Father   . Ovarian cancer Maternal Aunt 37       deceased  . Pancreatic cancer Mother 66       deceased  . Cancer Maternal Aunt        2 other mat aunts; unk. primary in 87s; deceased  . Cancer Maternal Uncle        "bone ca"; unk. primary; deceased 72s  . Prostate cancer Maternal Uncle  deceased 21  . Colon cancer Neg Hx   . Esophageal cancer Neg Hx   . Rectal cancer Neg Hx   . Stomach cancer Neg Hx     ALLERGIES:  is allergic to codeine.  MEDICATIONS:  Current Outpatient  Medications  Medication Sig Dispense Refill  . aspirin 81 MG tablet Take 81 mg by mouth daily as needed (leg pain).     . BD PEN NEEDLE NANO U/F 32G X 4 MM MISC USE 1 PEN NEEDLE 2 (TWO) TIMES DAILY. 200 each 1  . dapagliflozin propanediol (FARXIGA) 10 MG TABS tablet Take 10 mg by mouth daily. 30 tablet 5  . glucose blood (TRUE METRIX BLOOD GLUCOSE TEST) test strip 1 each by Other route 3 (three) times daily. Use as instructed 600 each 2  . ibuprofen (ADVIL,MOTRIN) 200 MG tablet Take 400 mg by mouth every 8 (eight) hours as needed for headache or moderate pain.     . Insulin Glargine (BASAGLAR KWIKPEN) 100 UNIT/ML SOPN Inject 0.14 mLs (14 Units total) into the skin at bedtime. 15 mL 1  . letrozole (FEMARA) 2.5 MG tablet Take 1 tablet (2.5 mg total) by mouth daily. 90 tablet 3  . lisinopril-hydrochlorothiazide (PRINZIDE,ZESTORETIC) 20-25 MG tablet Take 1 tablet by mouth daily. MUST SCHEDULE ANNUAL EXAM 90 tablet 0  . metFORMIN (GLUCOPHAGE) 1000 MG tablet Take 1 tablet (1,000 mg total) by mouth 2 (two) times daily with a meal. 180 tablet 3  . Omega-3 Fatty Acids (FISH OIL) 1200 MG CAPS Take 1,200 mg by mouth daily.     . ondansetron (ZOFRAN-ODT) 4 MG disintegrating tablet Take 1 tablet (4 mg total) by mouth every 8 (eight) hours as needed for nausea or vomiting. 20 tablet 1  . Potassium Chloride ER 20 MEQ TBCR Take 40 mEq daily by mouth. 60 tablet 2  . sitaGLIPtin (JANUVIA) 100 MG tablet Take 1 tablet (100 mg total) by mouth daily. 90 tablet 3  . torsemide (DEMADEX) 20 MG tablet Take 20 mg by mouth 2 (two) times daily as needed (swelling).     . triamcinolone cream (KENALOG) 0.1 % APPLY 1 APPLICATION TOPICALLY 2 (TWO) TIMES DAILY TO AFFECTED AREAS 80 g 0  . TRUEPLUS LANCETS 33G MISC 1 each by Does not apply route 3 (three) times daily. 600 each 2   No current facility-administered medications for this visit.     REVIEW OF SYSTEMS:   Constitutional: Denies fevers, chills or abnormal night  sweats Eyes: Denies blurriness of vision, double vision or watery eyes Ears, nose, mouth, throat, and face: Denies mucositis or sore throat   Respiratory: Denies cough, dyspnea or wheezes Cardiovascular: Denies palpitation, chest discomfort Gastrointestinal:  Denies nausea, heartburn, no new changes in BMs Skin: Denies abnormal skin rashes  Lymphatics: Denies new lymphadenopathy or easy bruising Neurological:Denies numbness  MSK: (+) cramping in left thigh  Behavioral/Psych: Mood is stable, no new changes  All other systems were reviewed with the patient and are negative.  PHYSICAL EXAMINATION:  ECOG PERFORMANCE STATUS: 1  Vitals:   10/05/18 1036  BP: (!) 150/74  Pulse: 86  Resp: 18  Temp: 98.9 F (37.2 C)  SpO2: 99%   Filed Weights   10/05/18 1036  Weight: 233 lb 3.2 oz (105.8 kg)    GENERAL:alert, no distress and comfortable SKIN: skin color, texture, turgor are normal, no rashes or significant lesions EYES: normal, conjunctiva are pink and non-injected, sclera clear OROPHARYNX:no exudate, no erythema and lips, buccal mucosa, and tongue normal  NECK: supple, thyroid normal size, non-tender, without nodularity LYMPH:  no palpable lymphadenopathy in the cervical, axillary or inguinal LUNGS: clear to auscultation and percussion with normal breathing effort HEART: regular rate & rhythm and no murmurs  ABDOMEN:abdomen soft, non-tender and normal bowel sounds (+) S/p hernia repair: umbilical surgical incision healed well  Musculoskeletal:no cyanosis of digits and no clubbing  PSYCH: alert & oriented x 3 with fluent speech NEURO: no focal motor/sensory deficits,  MSK: (+) left thigh cramps  Breasts: Breast inspection (+) S/p left mastectomy: left breast is surgically absent, surgical incision has healed well. Soft tissue fullness around incision . No bilateral palpable mass or adenopathy in breast or axilla.   LABORATORY DATA:  I have reviewed the data as listed CBC Latest  Ref Rng & Units 10/05/2018 05/20/2018 02/10/2018  WBC 4.0 - 10.5 K/uL 7.9 7.9 8.1  Hemoglobin 12.0 - 15.0 g/dL 11.7(L) 11.2(L) 10.3(L)  Hematocrit 36.0 - 46.0 % 36.1 33.7(L) 30.8(L)  Platelets 150 - 400 K/uL 307 280 327   CMP Latest Ref Rng & Units 10/05/2018 05/20/2018 02/10/2018  Glucose 70 - 99 mg/dL 123(H) 141(H) 216(H)  BUN 8 - 23 mg/dL 16 15 19   Creatinine 0.44 - 1.00 mg/dL 1.17(H) 0.99 1.02(H)  Sodium 135 - 145 mmol/L 139 138 137  Potassium 3.5 - 5.1 mmol/L 5.0 4.4 4.2  Chloride 98 - 111 mmol/L 107 105 107  CO2 22 - 32 mmol/L 24 26 20(L)  Calcium 8.9 - 10.3 mg/dL 9.4 9.4 9.1  Total Protein 6.5 - 8.1 g/dL 7.4 7.5 -  Total Bilirubin 0.3 - 1.2 mg/dL 0.3 0.4 -  Alkaline Phos 38 - 126 U/L 77 78 -  AST 15 - 41 U/L 46(H) 32 -  ALT 0 - 44 U/L 48(H) 34 -    PATHOLOGY REPORT  Diagnosis 04/17/2016 Breast, left, needle core biopsy, inner mid breast - INVASIVE DUCTAL CARCINOMA WITH CALCIFICATIONS, SEE COMMENT. - DUCTAL CARCINOMA IN SITU WITH COMEDONECROSIS. Microscopic Comment While grading is best performed on the resection specimen the invasive carcinoma appears grade 2. Prognostic markers will be ordered and reported in an addendum. Dr. Lyndon Code has reviewed the case. The case was called to [facility] on 04/20/2016. Results: HER2 - *EQUIVOCAL* (Her2 by IHC will be ordered.) RATIO OF HER2/CEP17 SIGNALS 1.73 AVERAGE HER2 COPY NUMBER PER CELL 4.15 By immunohistochemistry, the tumor cells are positive for Her2 (3). Results: IMMUNOHISTOCHEMICAL AND MORPHOMETRIC ANALYSIS PERFORMED MANUALLY Estrogen Receptor: 100%, POSITIVE, STRONG STAINING INTENSITY Progesterone Receptor: 15%, POSITIVE, STRONG STAINING INTENSITY Proliferation Marker Ki67: 10%  Diagnosis 05/15/2016 Lymph node, needle/core biopsy, right axilla - BENIGN REACTIVE LYMPH NODE. - NO GRANULOMAS OR MALIGNANCY IDENTIFIED. - SEE COMMENT. Microscopic Comment Internal departmental review obtained (Dr. Lyndon Code) with  agreement. Results are phoned to [facility] (05/18/16). (MEG:gt, 05/18/16)  Breast, simple mastectomy, Left 05/28/2016 - INVASIVE DUCTAL CARCINOMA, GRADE III/III, SPANNING 3.6 CM. - DUCTAL CARCINOMA IN SITU, HIGH GRADE. - LYMPHOVASCULAR INVASION IS IDENTIFIED. - THE SURGICAL RESECTION MARGINS ARE NEGATIVE FOR CARCINOMA. - SEE ONCOLOGY TABLE BELOW. Microscopic Comment BREAST, INVASIVE TUMOR, WITHOUT LYMPH NODES PRESENT Specimen, including laterality : Left breast Procedure: Simple mastectomy Histologic type: Ductal with micropapillary features Grade: III Tubule formation: 3 Nuclear pleomorphism: 2 Mitotic: 3 Tumor size (gross measurement): 3.6 cm Margins: Greater than 0.2 cm to all margins Lymphovascular invasion: Present Ductal carcinoma in situ: Present Grade: High grade Extensive intraductal component: Yes Lobular neoplasia: Not identified Tumor focality: Unifocal Treatment effect: N/A  Extent of tumor: Confined to breast parenchyma. Breast prognostic profile: [phone] Estrogen receptor: 100%, strong staining intensity Progesterone receptor: 15%, strong staining intensity Her 2 neu: Amplification was detected by immunohistochemistry. Ki-67: 10% Non-neoplastic breast: No significant findings. TNM: pT2, pNX (clinically recurrent). Comments: In addition to the main tumor, there is ductal carcinoma in situ present in random tissue submitted from the inferior lateral quadrant. (JBK:kh 06-01-16)  PROCEDURES  Colonoscopy 09/22/17 IMPRESSION: - One 3 mm polyp in the transverse colon, removed with a cold biopsy forceps. Resected and retrieved. - Non-bleeding internal hemorrhoids.   ECHO 07/01/17 Study Conclusions - Left ventricle: The cavity size was normal. Systolic function was   normal. The estimated ejection fraction was in the range of 55%   to 60%. Wall motion was normal; there were no regional wall   motion abnormalities. Doppler  parameters are consistent with   abnormal left ventricular relaxation (grade 1 diastolic   dysfunction). - Aortic valve: Transvalvular velocity was within the normal range.   There was no stenosis. There was no regurgitation. - Mitral valve: Calcified annulus. Transvalvular velocity was   within the normal range. There was no evidence for stenosis.   There was no regurgitation. - Left atrium: The atrium was mildly dilated. - Atrial septum: The septum bowed from left to right, consistent   with increased left atrial pressure. - Tricuspid valve: There was mild regurgitation. - Pulmonary arteries: Systolic pressure was within the normal   range. PA peak pressure: 24 mm Hg (S). - Global longitudinal strain -20.5% (normal).   RADIOGRAPHIC STUDIES: I have personally reviewed the radiological images as listed and agreed with the findings in the report. No results found.  Screening Mammogram 05/02/18 IMPRESSION: No mammographic evidence of malignancy. A result letter of this screening mammogram will be mailed directly to the patient. RECOMMENDATION: Screening mammogram in one year. (Code:SM-B-01Y)  Bone Density 04/29/2017 DualFemur Neck Right 04/29/2017    65.9         -0.8    0.928 g/cm2 AP Spine  L1-L4      04/29/2017    65.9         1.7     1.406 g/cm2  Mammogram 04/29/2017 IMPRESSION: No mammographic evidence of malignancy. A result letter of this screening mammogram will be mailed directly to the patient.  DG Chest 2 View 10/28/2016 IMPRESSION: No acute cardiopulmonary abnormality.  ASSESSMENT & PLAN:  67 y.o. 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  1. Cancer of central portion of left breast, pT2NxM0, stage IIA, G3, triple positive -I previously reviewed her surgical pathology findings in great details with patient and her family member. It showed a  3.6 cm mass, grade 3, surgical margins were negative. She had no sentinel lymph node biopsy due to her prior history of axillary lymph node dissection. -Her restaging CT scan of bone scan showed no evidence of distant metastasis. -We previously discussed the risk of cancer recurrence of this complete surgical resection. We reviewed that HER-2 positive with cancers are more aggressive, with increased risk of metastasis.  -she has completed adjuvant TCHP (docetaxel, carbotaxol, Herceptin, pejeta), and one year Herceptin therapy - her last echo was 06/2017, EF normal, stable -She had colonoscopy on 09/22/17 which showed benign polyps that were removed and internal hemorrhoids. Overall benign. -She started Nerlynx 247m on 08/26/17, was held on 09/14/17 due to significant diarrhea and restarted when her diarrhea resolved  on 09/21/17. She subsequently tolerated very well, and completed 1 year treatment.   -Continue letrozole, she tolerates well with manageable joint pain. Plan for a total of 7 years  -She is clinically doing well. Lab reviewed, CBC showed Hg 11.7, CMP showed Cr 1.17 AST 46 and ALT 48. Her physical exam and her 04/2018 mammogram were unremarkable. There is no clinical concern for recurrence. -continue Letrozole daily -F/u in 6 months  2. Anemia  -Secondary to chemotherapy  -Her H/H was normal before chemo  -mild with Hg at 11.7 today     3. DM and HTN  -She'll continue follow-up with her primary care physician -previously I strongly encouraged her to monitor her blood pressure and glucose at home, and consider increasing the dose of metformin and glyburide if needed -Her A1c worsened to 8.8 on 04/05/18 -her BP and blood glucose has been more controlled lately    4. Obesity  -I previously encouraged her to eat healthy and exercise regularly, she is willing to lose some weight after she completes chemotherapy.  5. Bone Health -We previously discussed that Letrozole can cause some  bone weakening  -Her Bone density scan from 04/29/17 her Femur Neck Right T-score is -0.8. This patient is considered normal. -I encouraged her to take calcium and vitamin D. We'll monitor her Vit D levels. -Will repeat DEXA in 2020    6. Diarrhea  -Secondary to Neratinib  -Much improved, she has not needed Imodium lately.   7. Joint pain and leg cramping  -secondary to letrozole  -I previously suggested she take her high dose Vitamin D and calcium  -Overall this is manageable for her, stable  -I advised her to take multivitamins that contain vitamin D.  8. History of AKI -She developed AKI when she was on neratinib due to diarrhea, resolved subsequently. -She has mildly elevated creatinine 1.17 today, GFR 55, I encouraged her to drink fluids adequately.  Plan -Continue letrozole daily  -Lab and f/u in 6 months  -I will order DEXA and mammogram next visit   All questions were answered. The patient knows to call the clinic with any problems, questions or concerns.  I spent 20 minutes counseling the patient face to face. The total time spent in the appointment was  25 minutes and more than 50% was on counseling.  Dierdre Searles Dweik am acting as scribe for Dr. [name].  I have reviewed the above documentation for accuracy and completeness, and I agree with the above.     [name], MD 10/05/2018

## 2018-10-04 ENCOUNTER — Encounter: Payer: Self-pay | Admitting: Internal Medicine

## 2018-10-05 ENCOUNTER — Encounter: Payer: Self-pay | Admitting: Hematology

## 2018-10-05 ENCOUNTER — Telehealth: Payer: Self-pay

## 2018-10-05 ENCOUNTER — Inpatient Hospital Stay: Payer: Medicare HMO | Attending: Adult Health

## 2018-10-05 ENCOUNTER — Inpatient Hospital Stay (HOSPITAL_BASED_OUTPATIENT_CLINIC_OR_DEPARTMENT_OTHER): Payer: Medicare HMO | Admitting: Hematology

## 2018-10-05 VITALS — BP 150/74 | HR 86 | Temp 98.9°F | Resp 18 | Ht 67.0 in | Wt 233.2 lb

## 2018-10-05 DIAGNOSIS — Z923 Personal history of irradiation: Secondary | ICD-10-CM

## 2018-10-05 DIAGNOSIS — I10 Essential (primary) hypertension: Secondary | ICD-10-CM | POA: Diagnosis not present

## 2018-10-05 DIAGNOSIS — Z794 Long term (current) use of insulin: Secondary | ICD-10-CM

## 2018-10-05 DIAGNOSIS — Z79811 Long term (current) use of aromatase inhibitors: Secondary | ICD-10-CM | POA: Insufficient documentation

## 2018-10-05 DIAGNOSIS — E119 Type 2 diabetes mellitus without complications: Secondary | ICD-10-CM

## 2018-10-05 DIAGNOSIS — Z79899 Other long term (current) drug therapy: Secondary | ICD-10-CM | POA: Diagnosis not present

## 2018-10-05 DIAGNOSIS — Z9221 Personal history of antineoplastic chemotherapy: Secondary | ICD-10-CM | POA: Insufficient documentation

## 2018-10-05 DIAGNOSIS — Z170 Estrogen receptor positive status [ER+]: Secondary | ICD-10-CM | POA: Insufficient documentation

## 2018-10-05 DIAGNOSIS — Z9012 Acquired absence of left breast and nipple: Secondary | ICD-10-CM | POA: Diagnosis not present

## 2018-10-05 DIAGNOSIS — Z7982 Long term (current) use of aspirin: Secondary | ICD-10-CM | POA: Diagnosis not present

## 2018-10-05 DIAGNOSIS — Z87891 Personal history of nicotine dependence: Secondary | ICD-10-CM | POA: Diagnosis not present

## 2018-10-05 DIAGNOSIS — C50112 Malignant neoplasm of central portion of left female breast: Secondary | ICD-10-CM | POA: Diagnosis not present

## 2018-10-05 DIAGNOSIS — E1165 Type 2 diabetes mellitus with hyperglycemia: Secondary | ICD-10-CM

## 2018-10-05 LAB — COMPREHENSIVE METABOLIC PANEL
ALT: 48 U/L — ABNORMAL HIGH (ref 0–44)
AST: 46 U/L — ABNORMAL HIGH (ref 15–41)
Albumin: 3.8 g/dL (ref 3.5–5.0)
Alkaline Phosphatase: 77 U/L (ref 38–126)
Anion gap: 8 (ref 5–15)
BUN: 16 mg/dL (ref 8–23)
CO2: 24 mmol/L (ref 22–32)
Calcium: 9.4 mg/dL (ref 8.9–10.3)
Chloride: 107 mmol/L (ref 98–111)
Creatinine, Ser: 1.17 mg/dL — ABNORMAL HIGH (ref 0.44–1.00)
GFR calc Af Amer: 55 mL/min — ABNORMAL LOW (ref >60)
GFR calc non Af Amer: 47 mL/min — ABNORMAL LOW (ref >60)
Glucose, Bld: 123 mg/dL — ABNORMAL HIGH (ref 70–99)
Potassium: 5 mmol/L (ref 3.5–5.1)
Sodium: 139 mmol/L (ref 135–145)
Total Bilirubin: 0.3 mg/dL (ref 0.3–1.2)
Total Protein: 7.4 g/dL (ref 6.5–8.1)

## 2018-10-05 LAB — CBC WITH DIFFERENTIAL (CANCER CENTER ONLY)
Abs Immature Granulocytes: 0.01 10*3/uL (ref 0.00–0.07)
Basophils Absolute: 0 10*3/uL (ref 0.0–0.1)
Basophils Relative: 0 %
Eosinophils Absolute: 0.3 10*3/uL (ref 0.0–0.5)
Eosinophils Relative: 4 %
HCT: 36.1 % (ref 36.0–46.0)
Hemoglobin: 11.7 g/dL — ABNORMAL LOW (ref 12.0–15.0)
Immature Granulocytes: 0 %
Lymphocytes Relative: 26 %
Lymphs Abs: 2.1 10*3/uL (ref 0.7–4.0)
MCH: 33.1 pg (ref 26.0–34.0)
MCHC: 32.4 g/dL (ref 30.0–36.0)
MCV: 102 fL — ABNORMAL HIGH (ref 80.0–100.0)
Monocytes Absolute: 0.6 10*3/uL (ref 0.1–1.0)
Monocytes Relative: 7 %
Neutro Abs: 4.9 10*3/uL (ref 1.7–7.7)
Neutrophils Relative %: 63 %
Platelet Count: 307 10*3/uL (ref 150–400)
RBC: 3.54 MIL/uL — ABNORMAL LOW (ref 3.87–5.11)
RDW: 13 % (ref 11.5–15.5)
WBC Count: 7.9 10*3/uL (ref 4.0–10.5)
nRBC: 0 % (ref 0.0–0.2)

## 2018-10-05 NOTE — Telephone Encounter (Signed)
Patient declined avs and calender of upcoming appointment. Per 11/6 los ([place])

## 2018-11-10 ENCOUNTER — Other Ambulatory Visit: Payer: Self-pay | Admitting: Internal Medicine

## 2018-11-11 ENCOUNTER — Telehealth: Payer: Self-pay

## 2018-11-11 NOTE — Telephone Encounter (Signed)
Call pt:  She is past due for follow up. Not sure if she is due for physical or not. Can you get her on the schedule so we can discuss chronic issues, have follow up labs

## 2018-11-11 NOTE — Telephone Encounter (Signed)
Please address statin therapy need per [facility]/metric gap. Thank you.

## 2018-11-16 NOTE — Telephone Encounter (Signed)
I have pt scheduled for 12/08/2018 for AWV

## 2018-12-01 ENCOUNTER — Ambulatory Visit: Payer: Medicare PPO | Admitting: Podiatry

## 2018-12-01 ENCOUNTER — Inpatient Hospital Stay: Payer: Medicare HMO | Attending: Adult Health

## 2018-12-01 DIAGNOSIS — Z170 Estrogen receptor positive status [ER+]: Secondary | ICD-10-CM | POA: Insufficient documentation

## 2018-12-01 DIAGNOSIS — Z79811 Long term (current) use of aromatase inhibitors: Secondary | ICD-10-CM | POA: Diagnosis not present

## 2018-12-01 DIAGNOSIS — E119 Type 2 diabetes mellitus without complications: Secondary | ICD-10-CM | POA: Diagnosis not present

## 2018-12-01 DIAGNOSIS — I10 Essential (primary) hypertension: Secondary | ICD-10-CM | POA: Diagnosis not present

## 2018-12-01 DIAGNOSIS — Z794 Long term (current) use of insulin: Secondary | ICD-10-CM | POA: Diagnosis not present

## 2018-12-01 DIAGNOSIS — Z87891 Personal history of nicotine dependence: Secondary | ICD-10-CM | POA: Insufficient documentation

## 2018-12-01 DIAGNOSIS — Z7982 Long term (current) use of aspirin: Secondary | ICD-10-CM | POA: Insufficient documentation

## 2018-12-01 DIAGNOSIS — Z79899 Other long term (current) drug therapy: Secondary | ICD-10-CM | POA: Insufficient documentation

## 2018-12-01 DIAGNOSIS — Z923 Personal history of irradiation: Secondary | ICD-10-CM | POA: Diagnosis not present

## 2018-12-01 DIAGNOSIS — C50112 Malignant neoplasm of central portion of left female breast: Secondary | ICD-10-CM | POA: Diagnosis not present

## 2018-12-01 DIAGNOSIS — Z9012 Acquired absence of left breast and nipple: Secondary | ICD-10-CM | POA: Diagnosis not present

## 2018-12-01 DIAGNOSIS — Z9221 Personal history of antineoplastic chemotherapy: Secondary | ICD-10-CM | POA: Insufficient documentation

## 2018-12-01 LAB — CBC WITH DIFFERENTIAL (CANCER CENTER ONLY)
Abs Immature Granulocytes: 0.02 10*3/uL (ref 0.00–0.07)
Basophils Absolute: 0 10*3/uL (ref 0.0–0.1)
Basophils Relative: 0 %
Eosinophils Absolute: 0.2 10*3/uL (ref 0.0–0.5)
Eosinophils Relative: 3 %
HCT: 35.8 % — ABNORMAL LOW (ref 36.0–46.0)
Hemoglobin: 11.8 g/dL — ABNORMAL LOW (ref 12.0–15.0)
Immature Granulocytes: 0 %
Lymphocytes Relative: 30 %
Lymphs Abs: 2.1 10*3/uL (ref 0.7–4.0)
MCH: 33 pg (ref 26.0–34.0)
MCHC: 33 g/dL (ref 30.0–36.0)
MCV: 100 fL (ref 80.0–100.0)
Monocytes Absolute: 0.5 10*3/uL (ref 0.1–1.0)
Monocytes Relative: 7 %
Neutro Abs: 4.4 10*3/uL (ref 1.7–7.7)
Neutrophils Relative %: 60 %
Platelet Count: 308 10*3/uL (ref 150–400)
RBC: 3.58 MIL/uL — ABNORMAL LOW (ref 3.87–5.11)
RDW: 12.6 % (ref 11.5–15.5)
WBC Count: 7.2 10*3/uL (ref 4.0–10.5)
nRBC: 0 % (ref 0.0–0.2)

## 2018-12-01 LAB — COMPREHENSIVE METABOLIC PANEL
ALT: 48 U/L — ABNORMAL HIGH (ref 0–44)
AST: 41 U/L (ref 15–41)
Albumin: 3.7 g/dL (ref 3.5–5.0)
Alkaline Phosphatase: 77 U/L (ref 38–126)
Anion gap: 6 (ref 5–15)
BUN: 16 mg/dL (ref 8–23)
CO2: 27 mmol/L (ref 22–32)
Calcium: 9.5 mg/dL (ref 8.9–10.3)
Chloride: 105 mmol/L (ref 98–111)
Creatinine, Ser: 0.95 mg/dL (ref 0.44–1.00)
GFR calc Af Amer: >60 mL/min (ref >60)
GFR calc non Af Amer: >60 mL/min (ref >60)
Glucose, Bld: 242 mg/dL — ABNORMAL HIGH (ref 70–99)
Potassium: 4.6 mmol/L (ref 3.5–5.1)
Sodium: 138 mmol/L (ref 135–145)
Total Bilirubin: 0.5 mg/dL (ref 0.3–1.2)
Total Protein: 7.6 g/dL (ref 6.5–8.1)

## 2018-12-06 ENCOUNTER — Ambulatory Visit: Payer: Medicare HMO | Admitting: Podiatry

## 2018-12-08 ENCOUNTER — Ambulatory Visit: Payer: Medicare HMO | Admitting: Internal Medicine

## 2018-12-09 ENCOUNTER — Telehealth: Payer: Self-pay

## 2018-12-09 NOTE — Telephone Encounter (Signed)
Spoke with patient regarding lab results, per Dr. Burr Medico, Lindsey Marquez is stable, Blood glucose was high patient was not fasting that day, she will try to fast the day of next lab draw, patient verbalized an understanding.

## 2018-12-09 NOTE — Telephone Encounter (Signed)
-----   Message from [name], MD sent at 12/05/2018  8:08 AM EST ----- I am not sure why she had lab last week, but please let her know the results, BG was high, anemia stable, no other concerns, thanks   [name]  12/05/2018

## 2018-12-20 ENCOUNTER — Ambulatory Visit (INDEPENDENT_AMBULATORY_CARE_PROVIDER_SITE_OTHER): Payer: Medicare HMO | Admitting: Internal Medicine

## 2018-12-20 ENCOUNTER — Encounter: Payer: Self-pay | Admitting: Internal Medicine

## 2018-12-20 VITALS — BP 142/78 | HR 86 | Temp 97.9°F | Ht 67.0 in | Wt 234.0 lb

## 2018-12-20 DIAGNOSIS — E1165 Type 2 diabetes mellitus with hyperglycemia: Secondary | ICD-10-CM

## 2018-12-20 DIAGNOSIS — R609 Edema, unspecified: Secondary | ICD-10-CM

## 2018-12-20 DIAGNOSIS — Z794 Long term (current) use of insulin: Secondary | ICD-10-CM

## 2018-12-20 DIAGNOSIS — Z0000 Encounter for general adult medical examination without abnormal findings: Secondary | ICD-10-CM

## 2018-12-20 DIAGNOSIS — I10 Essential (primary) hypertension: Secondary | ICD-10-CM

## 2018-12-20 DIAGNOSIS — E7800 Pure hypercholesterolemia, unspecified: Secondary | ICD-10-CM

## 2018-12-20 DIAGNOSIS — C50112 Malignant neoplasm of central portion of left female breast: Secondary | ICD-10-CM | POA: Diagnosis not present

## 2018-12-20 LAB — COMPREHENSIVE METABOLIC PANEL
ALT: 33 U/L (ref 0–35)
AST: 33 U/L (ref 0–37)
Albumin: 4.1 g/dL (ref 3.5–5.2)
Alkaline Phosphatase: 71 U/L (ref 39–117)
BUN: 17 mg/dL (ref 6–23)
CO2: 27 mEq/L (ref 19–32)
Calcium: 9.7 mg/dL (ref 8.4–10.5)
Chloride: 102 mEq/L (ref 96–112)
Creatinine, Ser: 0.95 mg/dL (ref 0.40–1.20)
GFR: 70.88 mL/min (ref >60.00)
Glucose, Bld: 211 mg/dL — ABNORMAL HIGH (ref 70–99)
Potassium: 3.8 mEq/L (ref 3.5–5.1)
Sodium: 137 mEq/L (ref 135–145)
Total Bilirubin: 0.6 mg/dL (ref 0.2–1.2)
Total Protein: 7.7 g/dL (ref 6.0–8.3)

## 2018-12-20 LAB — HEMOGLOBIN A1C: Hgb A1c MFr Bld: 9.8 % — ABNORMAL HIGH (ref 4.6–6.5)

## 2018-12-20 LAB — LIPID PANEL
Cholesterol: 187 mg/dL (ref 0–200)
HDL: 46.9 mg/dL (ref >39.00)
LDL Cholesterol: 105 mg/dL — ABNORMAL HIGH (ref 0–99)
NonHDL: 140.22
Total CHOL/HDL Ratio: 4
Triglycerides: 178 mg/dL — ABNORMAL HIGH (ref 0.0–149.0)
VLDL: 35.6 mg/dL (ref 0.0–40.0)

## 2018-12-20 LAB — CBC
HCT: 37.2 % (ref 36.0–46.0)
Hemoglobin: 12.5 g/dL (ref 12.0–15.0)
MCHC: 33.7 g/dL (ref 30.0–36.0)
MCV: 99 fl (ref 78.0–100.0)
Platelets: 350 10*3/uL (ref 150.0–400.0)
RBC: 3.75 Mil/uL — ABNORMAL LOW (ref 3.87–5.11)
RDW: 13.1 % (ref 11.5–15.5)
WBC: 7.4 10*3/uL (ref 4.0–10.5)

## 2018-12-20 LAB — VITAMIN D 25 HYDROXY (VIT D DEFICIENCY, FRACTURES): VITD: 25.09 ng/mL — ABNORMAL LOW (ref 30.00–100.00)

## 2018-12-20 NOTE — Assessment & Plan Note (Signed)
Advised her to take Torsemide and Potassium if she notices lower extremity edema She has TEDs she can wear Discussed DASH diet, regular physical activity

## 2018-12-20 NOTE — Assessment & Plan Note (Signed)
Elevated today but she did not take her medicine today Discussed the importance of taking antihypertensives at least 2 hours prior to appts Reinforced DASH diet and exercise for weight loss CBC and CMET today

## 2018-12-20 NOTE — Progress Notes (Signed)
HPI:  Pt presents to the clinic today for her Medicare Wellness Exam. She is also due for follow up of chronic conditions.  History of breast cancer: 2002, reoccurrence in 2017, currently In remission.  She is taking Femara as prescribed.  She follows with Dr. Burr Medico.    DM 2: Her last A1C was 9.4 %, 08/2018. She is taking Metformin, [place]ade, Januvia, and Engineer, agricultural as prescribed. Her has not been checking her sugars. She checks her feet daily. She has some numbness and tingling in her feet. Her last eye exam was last year, scheduled next week. She follows with Dr. Cruzita Lederer.  HTN: Her BP today is 142/78, but she reports she forgot to take her medication today. She is taking Lisinopril HCT as prescribed. She does not check her blood pressure at home. ECG from 07/2017 reviewed.  Edema: She takes Torsemide and Potassium as needed. She has not taken this recently though.  Past Medical History:  Diagnosis Date  . Blood transfusion without reported diagnosis   . Breast cancer [facility]) August 2002   Invasive ductal carcinoma Left breast. 2002, 2017  . Diabetes mellitus without complication ([place])   . Family history of malignant neoplasm of ovary   . Family history of pancreatic cancer   . Hypertension     Current Outpatient Medications  Medication Sig Dispense Refill  . aspirin 81 MG tablet Take 81 mg by mouth daily as needed (leg pain).     . BD PEN NEEDLE NANO U/F 32G X 4 MM MISC USE 1 PEN NEEDLE 2 (TWO) TIMES DAILY. 200 each 1  . dapagliflozin propanediol (FARXIGA) 10 MG TABS tablet Take 10 mg by mouth daily. 30 tablet 5  . glucose blood (TRUE METRIX BLOOD GLUCOSE TEST) test strip 1 each by Other route 3 (three) times daily. Use as instructed 600 each 2  . ibuprofen (ADVIL,MOTRIN) 200 MG tablet Take 400 mg by mouth every 8 (eight) hours as needed for headache or moderate pain.     . Insulin Glargine (BASAGLAR KWIKPEN) 100 UNIT/ML SOPN INJECT 0.14 MLS (14 UNITS TOTAL) INTO THE SKIN AT BEDTIME. 15 mL  11  . letrozole (FEMARA) 2.5 MG tablet Take 1 tablet (2.5 mg total) by mouth daily. 90 tablet 3  . lisinopril-hydrochlorothiazide (PRINZIDE,ZESTORETIC) 20-25 MG tablet Take 1 tablet by mouth daily. MUST SCHEDULE ANNUAL EXAM 90 tablet 0  . metFORMIN (GLUCOPHAGE) 1000 MG tablet Take 1 tablet (1,000 mg total) by mouth 2 (two) times daily with a meal. 180 tablet 3  . Omega-3 Fatty Acids (FISH OIL) 1200 MG CAPS Take 1,200 mg by mouth daily.     . ondansetron (ZOFRAN-ODT) 4 MG disintegrating tablet Take 1 tablet (4 mg total) by mouth every 8 (eight) hours as needed for nausea or vomiting. 20 tablet 1  . Potassium Chloride ER 20 MEQ TBCR Take 40 mEq daily by mouth. 60 tablet 2  . sitaGLIPtin (JANUVIA) 100 MG tablet Take 1 tablet (100 mg total) by mouth daily. 90 tablet 3  . torsemide (DEMADEX) 20 MG tablet Take 20 mg by mouth 2 (two) times daily as needed (swelling).     . triamcinolone cream (KENALOG) 0.1 % APPLY 1 APPLICATION TOPICALLY 2 (TWO) TIMES DAILY TO AFFECTED AREAS 80 g 0  . TRUEPLUS LANCETS 33G MISC 1 each by Does not apply route 3 (three) times daily. 600 each 2   No current facility-administered medications for this visit.     Allergies  Allergen Reactions  . Codeine Other (See  Comments)    "get crazy", "see things that aren't there"    Family History  Problem Relation Age of Onset  . Prostate cancer Father   . Ovarian cancer Maternal Aunt 34       deceased  . Pancreatic cancer Mother 12       deceased  . Cancer Maternal Aunt        2 other mat aunts; unk. primary in 16s; deceased  . Cancer Maternal Uncle        "bone ca"; unk. primary; deceased 20s  . Prostate cancer Maternal Uncle        deceased 28  . Colon cancer Neg Hx   . Esophageal cancer Neg Hx   . Rectal cancer Neg Hx   . Stomach cancer Neg Hx     Social History   Socioeconomic History  . Marital status: Divorced    Spouse name: Not on file  . Number of children: Not on file  . Years of education: Not on  file  . Highest education level: Not on file  Occupational History  . Not on file  Social Needs  . Financial resource strain: Not on file  . Food insecurity:    Worry: Not on file    Inability: Not on file  . Transportation needs:    Medical: Not on file    Non-medical: Not on file  Tobacco Use  . Smoking status: Former Smoker    Packs/day: 0.10    Years: 7.00    Pack years: 0.70    Last attempt to quit: 12/01/1975    Years since quitting: 43.0  . Smokeless tobacco: Never Used  Substance and Sexual Activity  . Alcohol use: No    Alcohol/week: 0.0 standard drinks  . Drug use: No  . Sexual activity: Yes  Lifestyle  . Physical activity:    Days per week: Not on file    Minutes per session: Not on file  . Stress: Not on file  Relationships  . Social connections:    Talks on phone: Not on file    Gets together: Not on file    Attends religious service: Not on file    Active member of club or organization: Not on file    Attends meetings of clubs or organizations: Not on file    Relationship status: Not on file  . Intimate partner violence:    Fear of current or ex partner: Not on file    Emotionally abused: Not on file    Physically abused: Not on file    Forced sexual activity: Not on file  Other Topics Concern  . Not on file  Social History Narrative  . Not on file    Hospitiliaztions: None  Health Maintenance:    Flu: 08/2018  Tetanus: 12/2013  Pneumovax: 01/2016  Prevnar: 09/2017  Zostavax: 08/2012  [facility]  Pap Smear: 01/2016  Bone Density: 03/2017  Colon Screening: 08/2017  Eye Doctor: 07/2017, the eye center  Dental Exam: annually   Providers:   PCP: Webb Silversmith, NP-C  Oncologist: Dr. Burr Medico  Cardiologist: Dr. Haroldine Laws  Gastroenterologist: Dr. Silverio Decamp    I have personally reviewed and have noted:  1. The patient's medical and social history 2. Their use of alcohol, tobacco or illicit drugs 3. Their current  medications and supplements 4. The patient's functional ability including ADL's, fall risks, home safety risks and hearing or visual impairment. 5. Diet and physical activities 6. Evidence for  depression or mood disorder  Subjective:   Review of Systems:   Constitutional: Denies fever, malaise, fatigue, headache or abrupt weight changes.  HEENT: Denies eye pain, eye redness, ear pain, ringing in the ears, wax buildup, runny nose, nasal congestion, bloody nose, or sore throat. Respiratory: Denies difficulty breathing, shortness of breath, cough or sputum production.   Cardiovascular: Pt reports swelling in legs. Denies chest pain, chest tightness, palpitations or swelling in the hands.  Gastrointestinal: Denies abdominal pain, bloating, constipation, diarrhea or blood in the stool.  GU: Denies urgency, frequency, pain with urination, burning sensation, blood in urine, odor or discharge. Musculoskeletal: Denies decrease in range of motion, difficulty with gait, muscle pain or joint pain and swelling.  Skin: Denies redness, rashes, lesions or ulcercations.  Neurological: Pt reports intermittent numbness and tingling in feet. Denies dizziness, difficulty with memory, difficulty with speech or problems with balance and coordination.  Psych: Denies anxiety, depression, SI/HI.  No other specific complaints in a complete review of systems (except as listed in HPI above).  Objective:  PE:   BP (!) 142/78   Pulse 86   Temp 97.9 F (36.6 C) (Oral)   Ht 5\' 7"  (1.702 m)   Wt 234 lb (106.1 kg)   SpO2 98%   BMI 36.65 kg/m   Wt Readings from Last 3 Encounters:  10/05/18 233 lb 3.2 oz (105.8 kg)  09/20/18 233 lb (105.7 kg)  06/03/18 237 lb 6.4 oz (107.7 kg)    General: Appears her stated age, obese in NAD. Skin: Warm, dry and intact. No ulcerations noted. HEENT: Head: normal shape and size; Eyes: sclera white, no icterus, conjunctiva pink, PERRLA and EOMs intact; Ears: Tm's gray and  intact, normal light reflex; Throat/Mouth: Teeth present, mucosa pink and moist, no exudate, lesions or ulcerations noted.  Neck: Neck supple, trachea midline. No masses, lumps or thyromegaly present.  Cardiovascular: Normal rate and rhythm. S1,S2 noted.  No murmur, rubs or gallops noted. Trace BLE edema. No carotid bruits noted. Pulmonary/Chest: Normal effort and positive vesicular breath sounds. No respiratory distress. No wheezes, rales or ronchi noted.  Abdomen: Soft and nontender. Normal bowel sounds. Ventral hernia noted. Liver, spleen and kidneys non palpable. Musculoskeletal:  Strength 5/5 BUE/BLE. No signs of joint swelling.  Neurological: Alert and oriented. Cranial nerves II-XII grossly intact. Coordination normal.  Psychiatric: Mood and affect normal. Behavior is normal. Judgment and thought content normal.     BMET    Component Value Date/Time   NA 138 12/01/2018 1132   NA 141 09/21/2017 1108   K 4.6 12/01/2018 1132   K 4.1 09/21/2017 1108   CL 105 12/01/2018 1132   CL 104 12/12/2012 1031   CO2 27 12/01/2018 1132   CO2 25 09/21/2017 1108   GLUCOSE 242 (H) 12/01/2018 1132   GLUCOSE 132 09/21/2017 1108   GLUCOSE 130 (H) 12/12/2012 1031   BUN 16 12/01/2018 1132   BUN 19.7 09/21/2017 1108   CREATININE 0.95 12/01/2018 1132   CREATININE 1.1 09/21/2017 1108   CALCIUM 9.5 12/01/2018 1132   CALCIUM 8.8 09/21/2017 1108   GFRNONAA >60 12/01/2018 1132   GFRAA >60 12/01/2018 1132    Lipid Panel     Component Value Date/Time   CHOL 160 10/04/2017 1210   TRIG 203.0 (H) 10/04/2017 1210   HDL 42.50 10/04/2017 1210   CHOLHDL 4 10/04/2017 1210   VLDL 40.6 (H) 10/04/2017 1210   LDLCALC 91 09/24/2016 0902    CBC    Component Value  Date/Time   WBC 7.2 12/01/2018 1132   WBC 7.9 05/20/2018 1313   RBC 3.58 (L) 12/01/2018 1132   HGB 11.8 (L) 12/01/2018 1132   HGB 10.0 (L) 10/11/2017 0858   HCT 35.8 (L) 12/01/2018 1132   HCT 29.6 (L) 10/11/2017 0858   PLT 308 12/01/2018 1132    PLT 336 10/11/2017 0858   MCV 100.0 12/01/2018 1132   MCV 101.4 (H) 10/11/2017 0858   MCH 33.0 12/01/2018 1132   MCHC 33.0 12/01/2018 1132   RDW 12.6 12/01/2018 1132   RDW 13.7 10/11/2017 0858   LYMPHSABS 2.1 12/01/2018 1132   LYMPHSABS 2.8 10/11/2017 0858   MONOABS 0.5 12/01/2018 1132   MONOABS 0.7 10/11/2017 0858   EOSABS 0.2 12/01/2018 1132   EOSABS 0.3 10/11/2017 0858   BASOSABS 0.0 12/01/2018 1132   BASOSABS 0.1 10/11/2017 0858    Hgb A1C Lab Results  Component Value Date   HGBA1C 9.4 (A) 09/20/2018      Assessment and Plan:   Medicare Annual Wellness Visit:  Diet: She does eat lean meat. She consumes fruits and veggies daily. She tries to avoid fried foods. She drinks mostly water. Physical activity: Sedentary Depression/mood screen: Negative Hearing: Intact to whispered voice Visual acuity: Grossly normal, performs annual eye exam  ADLs: Capable Fall risk: None Home safety: Good Cognitive evaluation: Intact to orientation, naming, recall and repetition EOL planning: No adv directives, full code/ I agree  Preventative Medicine: Flu, tetanus, pneumovax, prevnar and zostovax UTD. She declines shingrix vaccine. Pap smear, mammogram, bone density and colon screening UTD. Encouraged her to consume a balanced diet and exercise regimen. Advised her to see an eye doctor and dentist annually. Will check CBC, CMET, Lipid, A1C and Vit D today.   Next appointment: 1 year, Medicare Wellness Exam   Webb Silversmith, NP

## 2018-12-20 NOTE — Assessment & Plan Note (Signed)
In remission Continue Femara Continue to follow with oncology

## 2018-12-20 NOTE — Patient Instructions (Signed)
Health Maintenance After Age 68 After age 68, you are at a higher risk for certain long-term diseases and infections as well as injuries from falls. Falls are a major cause of broken bones and head injuries in people who are older than age 68. Getting regular preventive care can help to keep you healthy and well. Preventive care includes getting regular testing and making lifestyle changes as recommended by your health care provider. Talk with your health care provider about:  Which screenings and tests you should have. A screening is a test that checks for a disease when you have no symptoms.  A diet and exercise plan that is right for you. What should I know about screenings and tests to prevent falls? Screening and testing are the best ways to find a health problem early. Early diagnosis and treatment give you the best chance of managing medical conditions that are common after age 68. Certain conditions and lifestyle choices may make you more likely to have a fall. Your health care provider may recommend:  Regular vision checks. Poor vision and conditions such as cataracts can make you more likely to have a fall. If you wear glasses, make sure to get your prescription updated if your vision changes.  Medicine review. Work with your health care provider to regularly review all of the medicines you are taking, including over-the-counter medicines. Ask your health care provider about any side effects that may make you more likely to have a fall. Tell your health care provider if any medicines that you take make you feel dizzy or sleepy.  Osteoporosis screening. Osteoporosis is a condition that causes the bones to get weaker. This can make the bones weak and cause them to break more easily.  Blood pressure screening. Blood pressure changes and medicines to control blood pressure can make you feel dizzy.  Strength and balance checks. Your health care provider may recommend certain tests to check your  strength and balance while standing, walking, or changing positions.  Foot health exam. Foot pain and numbness, as well as not wearing proper footwear, can make you more likely to have a fall.  Depression screening. You may be more likely to have a fall if you have a fear of falling, feel emotionally low, or feel unable to do activities that you used to do.  Alcohol use screening. Using too much alcohol can affect your balance and may make you more likely to have a fall. What actions can I take to lower my risk of falls? General instructions  Talk with your health care provider about your risks for falling. Tell your health care provider if: ? You fall. Be sure to tell your health care provider about all falls, even ones that seem minor. ? You feel dizzy, sleepy, or off-balance.  Take over-the-counter and prescription medicines only as told by your health care provider. These include any supplements.  Eat a healthy diet and maintain a healthy weight. A healthy diet includes low-fat dairy products, low-fat (lean) meats, and fiber from whole grains, beans, and lots of fruits and vegetables. Home safety  Remove any tripping hazards, such as rugs, cords, and clutter.  Install safety equipment such as grab bars in bathrooms and safety rails on stairs.  Keep rooms and walkways well-lit. Activity   Follow a regular exercise program to stay fit. This will help you maintain your balance. Ask your health care provider what types of exercise are appropriate for you.  If you need a cane or   walker, use it as recommended by your health care provider.  Wear supportive shoes that have nonskid soles. Lifestyle  Do not drink alcohol if your health care provider tells you not to drink.  If you drink alcohol, limit how much you have: ? 0-1 drink a day for women. ? 0-2 drinks a day for men.  Be aware of how much alcohol is in your drink. In the [place] one drink equals one typical bottle of beer (12  oz), one-half glass of wine (5 oz), or one shot of hard liquor (1 oz).  Do not use any products that contain nicotine or tobacco, such as cigarettes and e-cigarettes. If you need help quitting, ask your health care provider. Summary  Having a healthy lifestyle and getting preventive care can help to protect your health and wellness after age 68.  Screening and testing are the best way to find a health problem early and help you avoid having a fall. Early diagnosis and treatment give you the best chance for managing medical conditions that are more common for people who are older than age 68.  Falls are a major cause of broken bones and head injuries in people who are older than age 68. Take precautions to prevent a fall at home.  Work with your health care provider to learn what changes you can make to improve your health and wellness and to prevent falls. This information is not intended to replace advice given to you by your health care provider. Make sure you discuss any questions you have with your health care provider. Document Released: 09/29/2017 Document Revised: 09/29/2017 Document Reviewed: 09/29/2017 Elsevier Interactive Patient Education  2019 [employer]  

## 2018-12-20 NOTE — Assessment & Plan Note (Signed)
Uncontrolled A1C today No microalbumin secondary to ACEI therapy Encouraged her to consume a low carb diet and exercise for weight loss Foot exam today Flu, pneumovax and prevnar UTD Eye exam scheduled next week, advised her to have them send me a copy Continue current medications per endocrinology

## 2018-12-20 NOTE — Assessment & Plan Note (Signed)
CMET and Lipid profile today Currently not on statin therapy If LDL> 100, will start statin therapy Encouraged her to consume a low fat diet.

## 2018-12-22 ENCOUNTER — Encounter: Payer: Self-pay | Admitting: Hematology

## 2018-12-22 ENCOUNTER — Other Ambulatory Visit: Payer: Self-pay | Admitting: Nurse Practitioner

## 2018-12-23 MED ORDER — LISINOPRIL-HYDROCHLOROTHIAZIDE 20-25 MG PO TABS: 1.0000 | ORAL_TABLET | Freq: Every day | ORAL | 2 refills | Status: DC

## 2018-12-23 MED ORDER — ATORVASTATIN CALCIUM 10 MG PO TABS: 5.0000 mg | ORAL_TABLET | Freq: Every day | ORAL | 0 refills | Status: DC

## 2018-12-23 NOTE — Addendum Note (Signed)
Addended by: Lurlean Nanny on: 12/23/2018 10:49 AM   Modules accepted: Orders

## 2018-12-29 ENCOUNTER — Telehealth: Payer: Self-pay

## 2018-12-29 NOTE — Telephone Encounter (Signed)
Faxed signed order back to [facility], sent to HIM for scanning to chart.  

## 2019-01-03 ENCOUNTER — Ambulatory Visit (INDEPENDENT_AMBULATORY_CARE_PROVIDER_SITE_OTHER): Payer: Medicare HMO | Admitting: Internal Medicine

## 2019-01-03 ENCOUNTER — Encounter: Payer: Self-pay | Admitting: Internal Medicine

## 2019-01-03 ENCOUNTER — Telehealth: Payer: Self-pay | Admitting: Internal Medicine

## 2019-01-03 VITALS — BP 140/60 | HR 97 | Ht 67.0 in | Wt 229.0 lb

## 2019-01-03 DIAGNOSIS — Z794 Long term (current) use of insulin: Secondary | ICD-10-CM | POA: Diagnosis not present

## 2019-01-03 DIAGNOSIS — E1165 Type 2 diabetes mellitus with hyperglycemia: Secondary | ICD-10-CM | POA: Diagnosis not present

## 2019-01-03 MED ORDER — BASAGLAR KWIKPEN 100 UNIT/ML ~~LOC~~ SOPN: 14.0000 [IU] | PEN_INJECTOR | Freq: Every day | SUBCUTANEOUS | 11 refills | Status: DC

## 2019-01-03 MED ORDER — DAPAGLIFLOZIN PROPANEDIOL 10 MG PO TABS: 10.0000 mg | ORAL_TABLET | Freq: Every day | ORAL | 11 refills | Status: DC

## 2019-01-03 MED ORDER — GLUCOSE BLOOD VI STRP: 1.0000 | ORAL_STRIP | Freq: Three times a day (TID) | 2 refills | Status: DC

## 2019-01-03 MED ORDER — METFORMIN HCL 1000 MG PO TABS: 1000.0000 mg | ORAL_TABLET | Freq: Two times a day (BID) | ORAL | 3 refills | Status: DC

## 2019-01-03 NOTE — Patient Instructions (Addendum)
Please restart taking consistently: - Basaglar 14 units at bedtime. - Metformin 1000 mg 2x a day with meals - Farxiga 10 mg daily before b'fast - Januvia 100 mg before b'fast  Please check with your insurance if they cover: - Basaglar, Lantus, Levemir, Toujeo, Tresiba (long acting)  Please come back for a follow-up appointment in 3 months.

## 2019-01-03 NOTE — Telephone Encounter (Signed)
MEDICATION: glucose blood (TRUE METRIX BLOOD GLUCOSE TEST) test strip  PHARMACY:  [id number]  IS THIS A 90 DAY SUPPLY :   IS PATIENT OUT OF MEDICATION:   IF NOT; HOW MUCH IS LEFT: 12-20 test strips  LAST APPOINTMENT DATE: @2 /03/2019  NEXT APPOINTMENT DATE:@Visit  date not found  DO WE HAVE YOUR PERMISSION TO LEAVE A DETAILED MESSAGE:  OTHER COMMENTS:    **Let patient know to contact pharmacy at the end of the day to make sure medication is ready. **  ** Please notify patient to allow 48-72 hours to process**  **Encourage patient to contact the pharmacy for refills or they can request refills through [facility]**

## 2019-01-03 NOTE — Progress Notes (Signed)
Will Patient ID: Lindsey Marquez, female   DOB: 12-27-1950, 68 y.o.   MRN: [id number]   HPI: Lindsey Marquez is a 68 y.o.-year-old female, returning for follow-up for DM2, dx in ~2001, insulin-dependent, uncontrolled, without long-term complications. Last visit 4.5 months ago.  Upon questioning, she tells me that she has not been compliant with her diabetic regimen in the last few weeks.  Sugars are much higher.  Last hemoglobin A1c was: Lab Results  Component Value Date   HGBA1C 9.8 (H) 12/20/2018   HGBA1C 9.4 (A) 09/20/2018   HGBA1C 8.8 (H) 04/05/2018   Pt was on a regimen of: - Basaglar 14 units at bedtime - Metformin-glyburide 03-999 mg 2x a day with meal - Januvia 100 mg daily in a.m.  Now on: but missing many doses: - Basaglar 14 units at bedtime. - Metformin 1000 mg 2x a day with meals - Farxiga 10 mg daily before b'fast - Januvia 100 mg daily in a.m. -added back 08/2018   Pt checks her sugars 2x a day - 30 day ave: 260: - am: 89-140 >> 117-130 >> 190, 243, 318 - 2h after b'fast: n/c - before lunch: n/c >> 329 - 2h after lunch: n/c >> 344 - before dinner: n/c >> 176, 226 - 2h after dinner: n/c - bedtime: 110-120s >> 100, 117, 160-170 (candy, icecream) >> 244, 266 - nighttime: n/c Lowest sugar was 69 >> 100 >> 176; she has hypoglycemia awareness in the 70s. Highest sugar was 240 >> 170 >> 344  Glucometer: True metrix (we cannot download this)  Pt's meals are: - Breakfast:coffee, mc muffin, egg - Lunch: [place] sandwich - Dinner: baked chicken, veggies,dessert - apples, icecream - Snacks: yoghurt, pretzels, PB  -No CKD, last BUN/creatinine:  Lab Results  Component Value Date   BUN 17 12/20/2018   BUN 16 12/01/2018   CREATININE 0.95 12/20/2018   CREATININE 0.95 12/01/2018  On lisinopril 20. -+ HL; last set of lipids: Lab Results  Component Value Date   CHOL 187 12/20/2018   HDL 46.90 12/20/2018   LDLCALC 105 (H) 12/20/2018   LDLDIRECT 95.0 10/04/2017    TRIG 178.0 (H) 12/20/2018   CHOLHDL 4 12/20/2018  On this oil. - last eye exam was in 2019: No DR -No numbness and tingling in her feet.  On ASA 53  Pt has FH of DM in brother, sister, father.  + h/o BrCA in 2013, recurrence in 2018. Now cancer free.   ROS: Constitutional: no weight gain/+weight loss, no fatigue, no subjective hyperthermia, no subjective hypothermia Eyes: no blurry vision, no xerophthalmia ENT: no sore throat, no nodules palpated in neck, no dysphagia, no odynophagia, no hoarseness Cardiovascular: no CP/no SOB/no palpitations/no leg swelling Respiratory: no cough/no SOB/no wheezing Gastrointestinal: no N/no V/no D/no C/no acid reflux Musculoskeletal: no muscle aches/+ joint aches Skin: no rashes, no hair loss Neurological: no tremors/no numbness/no tingling/no dizziness  I reviewed pt's medications, allergies, PMH, social hx, family hx, and changes were documented in the history of present illness. Otherwise, unchanged from my initial visit note.  Current Outpatient Medications on File Prior to Visit  Medication Sig  . aspirin 81 MG tablet Take 81 mg by mouth daily as needed (leg pain).   Marland Kitchen atorvastatin (LIPITOR) 10 MG tablet Take 0.5 tablets (5 mg total) by mouth daily.  . BD PEN NEEDLE NANO U/F 32G X 4 MM MISC USE 1 PEN NEEDLE 2 (TWO) TIMES DAILY.  . dapagliflozin propanediol (FARXIGA) 10 MG TABS tablet Take  10 mg by mouth daily.  Marland Kitchen glucose blood (TRUE METRIX BLOOD GLUCOSE TEST) test strip 1 each by Other route 3 (three) times daily. Use as instructed  . ibuprofen (ADVIL,MOTRIN) 200 MG tablet Take 400 mg by mouth every 8 (eight) hours as needed for headache or moderate pain.   . Insulin Glargine (BASAGLAR KWIKPEN) 100 UNIT/ML SOPN INJECT 0.14 MLS (14 UNITS TOTAL) INTO THE SKIN AT BEDTIME.  Marland Kitchen letrozole (FEMARA) 2.5 MG tablet Take 1 tablet (2.5 mg total) by mouth daily.  Marland Kitchen lisinopril-hydrochlorothiazide (PRINZIDE,ZESTORETIC) 20-25 MG tablet Take 1 tablet by mouth  daily.  . metFORMIN (GLUCOPHAGE) 1000 MG tablet Take 1 tablet (1,000 mg total) by mouth 2 (two) times daily with a meal.  . Omega-3 Fatty Acids (FISH OIL) 1200 MG CAPS Take 1,200 mg by mouth daily.   . ondansetron (ZOFRAN-ODT) 4 MG disintegrating tablet Take 1 tablet (4 mg total) by mouth every 8 (eight) hours as needed for nausea or vomiting.  . Potassium Chloride ER 20 MEQ TBCR Take 40 mEq daily by mouth.  . sitaGLIPtin (JANUVIA) 100 MG tablet Take 1 tablet (100 mg total) by mouth daily.  Marland Kitchen torsemide (DEMADEX) 20 MG tablet Take 20 mg by mouth 2 (two) times daily as needed (swelling).   . triamcinolone cream (KENALOG) 0.1 % APPLY 1 APPLICATION TOPICALLY 2 (TWO) TIMES DAILY TO AFFECTED AREAS  . TRUEPLUS LANCETS 33G MISC 1 each by Does not apply route 3 (three) times daily.   No current facility-administered medications on file prior to visit.   Also, Glucovance 03-999 milligrams twice a day before meals  Past Medical History:  Diagnosis Date  . Blood transfusion without reported diagnosis   . Breast cancer [facility]) August 2002   Invasive ductal carcinoma Left breast. 2002, 2017  . Diabetes mellitus without complication ([place])   . Family history of malignant neoplasm of ovary   . Family history of pancreatic cancer   . Hypertension    Past Surgical History:  Procedure Laterality Date  . BREAST SURGERY Left 2003  . COLONOSCOPY    . FOOT SURGERY  2000  . INSERTION OF MESH N/A 02/17/2018   Procedure: INSERTION OF MESH;  Surgeon: [name], MD;  Location: [place];  Service: General;  Laterality: N/A;  . MASTECTOMY Left 05/28/2016  . port a cath insertion  05/28/2016  . PORT-A-CATH REMOVAL Right 08/19/2017   Procedure: REMOVAL PORT-A-CATH;  Surgeon: [name], MD;  Location: [place];  Service: General;  Laterality: Right;  . PORTACATH PLACEMENT Right 05/28/2016   Procedure: INSERTION PORT-A-CATH;  Surgeon: [name], MD;  Location: [place];  Service: General;   Laterality: Right;  . TOTAL MASTECTOMY Left 05/28/2016   Procedure: LEFT  MASTECTOMY;  Surgeon: [name], MD;  Location: [place];  Service: General;  Laterality: Left;  . TUBAL LIGATION  22 yrs since  2004.   Bilateral.  . VENTRAL HERNIA REPAIR  02/17/2018   open  . VENTRAL HERNIA REPAIR N/A 02/17/2018   Procedure: OPEN VENTRAL HERNIA REPAIR WITH MESH;  Surgeon: [name], MD;  Location: [place];  Service: General;  Laterality: N/A;   Social History   Socioeconomic History  . Marital status: Divorced    Spouse name: Not on file  . Number of children: Not on file  . Years of education: Not on file  . Highest education level: Not on file  Occupational History  . Not on file  Social Needs  . Financial resource strain: Not  on file  . Food insecurity:    Worry: Not on file    Inability: Not on file  . Transportation needs:    Medical: Not on file    Non-medical: Not on file  Tobacco Use  . Smoking status: Former Smoker    Packs/day: 0.10    Years: 7.00    Pack years: 0.70    Last attempt to quit: 12/01/1975    Years since quitting: 43.1  . Smokeless tobacco: Never Used  Substance and Sexual Activity  . Alcohol use: No    Alcohol/week: 0.0 standard drinks  . Drug use: No  . Sexual activity: Yes  Lifestyle  . Physical activity:    Days per week: Not on file    Minutes per session: Not on file  . Stress: Not on file  Relationships  . Social connections:    Talks on phone: Not on file    Gets together: Not on file    Attends religious service: Not on file    Active member of club or organization: Not on file    Attends meetings of clubs or organizations: Not on file    Relationship status: Not on file  . Intimate partner violence:    Fear of current or ex partner: Not on file    Emotionally abused: Not on file    Physically abused: Not on file    Forced sexual activity: Not on file  Other Topics Concern  . Not on file  Social History Narrative  . Not on file     Allergies  Allergen Reactions  . Codeine Other (See Comments)    "get crazy", "see things that aren't there"   Family History  Problem Relation Age of Onset  . Prostate cancer Father   . Ovarian cancer Maternal Aunt 5       deceased  . Pancreatic cancer Mother 3       deceased  . Cancer Maternal Aunt        2 other mat aunts; unk. primary in 56s; deceased  . Cancer Maternal Uncle        "bone ca"; unk. primary; deceased 31s  . Prostate cancer Maternal Uncle        deceased 82  . Colon cancer Neg Hx   . Esophageal cancer Neg Hx   . Rectal cancer Neg Hx   . Stomach cancer Neg Hx     PE: BP 140/60   Pulse 97   Ht _0  (1.702 m)   Wt 229 lb (103.9 kg)   SpO2 99%   BMI 35.87 kg/m  Wt Readings from Last 3 Encounters:  01/03/19 229 lb (103.9 kg)  12/20/18 234 lb (106.1 kg)  10/05/18 233 lb 3.2 oz (105.8 kg)   Constitutional: overweight, in NAD Eyes: PERRLA, EOMI, no exophthalmos ENT: moist mucous membranes, no thyromegaly, no cervical lymphadenopathy Cardiovascular: Tachycardia, RR, No MRG, + wears compression hoses Respiratory: CTA B Gastrointestinal: abdomen soft, NT, ND, BS+ Musculoskeletal: no deformities, strength intact in all 4 Skin: moist, warm, no rashes Neurological: no tremor with outstretched hands, DTR normal in all 4  ASSESSMENT: 1. DM2, insulin-dependent, uncontrolled, without long-term complications, but with hyperglycemia  2. HL  3.  Obesity  PLAN:  1. Patient with longstanding, uncontrolled, type 2 diabetes, on oral antidiabetic regimen and long-acting insulin.  Her sugars improved after addition of Basaglar, but they were still above target at last visit so I advised her to add back Januvia.  At  that time, sugars were a little higher at night especially after sweets and snacks.  Most of the sugars, however, were at goal.  It was surprising that her HbA1c was still high at last visit, at 9.4%, which was not in concordance with the sugars at  home.  I asked her to stop at the lab for a fructosamine level, but she forgot to do so. -She had a recent HbA1c, at 9.8%, even higher.  She tells me that she was noncompliant with her diabetic regimen in the last few weeks.  She is only taking doses as she remembers them, not consistently.  Subsequently, sugars are much higher per review of her meter. -Therefore, I cannot make any changes in her regimen now, but I strongly advised her to start taking the medications as prescribed. -She tells me that Lindsey Marquez is not affordable anymore.  I gave her a list of long-acting insulins-check her insurance whether these are covered - I suggested to:  Patient Instructions  Please restart taking consistently: - Basaglar 14 units at bedtime. - Metformin 1000 mg 2x a day with meals - Farxiga 10 mg daily before b'fast - Januvia 100 mg before b'fast  Please check with your insurance if they cover: - Basaglar, Lantus, Levemir, Toujeo, Tresiba (long acting)  Please come back for a follow-up appointment in 3 months.  - continue checking sugars at different times of the day - check 1-2x a day, rotating checks - advised for yearly eye exams >> she is UTD - Return to clinic in 3 mo with sugar log   2. HL - Reviewed latest lipid panel from zero 11/1018, LDL higher, above target, triglycerides also above target. Lab Results  Component Value Date   CHOL 187 12/20/2018   HDL 46.90 12/20/2018   LDLCALC 105 (H) 12/20/2018   LDLDIRECT 95.0 10/04/2017   TRIG 178.0 (H) 12/20/2018   CHOLHDL 4 12/20/2018  - Continues fish oil without side effects.  3.  Obesity -Lost 4 pounds since last visit, possibly due to glucotoxicity. -Restart Wilder Glade which should also help with weight loss.  Philemon Kingdom, MD PhD [facility]

## 2019-01-03 NOTE — Telephone Encounter (Signed)
RX sent

## 2019-01-09 ENCOUNTER — Encounter: Payer: Self-pay | Admitting: Internal Medicine

## 2019-01-10 MED ORDER — SITAGLIPTIN PHOSPHATE 100 MG PO TABS: 100.0000 mg | ORAL_TABLET | Freq: Every day | ORAL | 3 refills | Status: DC

## 2019-01-25 ENCOUNTER — Encounter: Payer: Self-pay | Admitting: Podiatry

## 2019-01-25 ENCOUNTER — Ambulatory Visit (INDEPENDENT_AMBULATORY_CARE_PROVIDER_SITE_OTHER): Payer: Medicare HMO | Admitting: Podiatry

## 2019-01-25 VITALS — BP 169/89 | HR 101

## 2019-01-25 DIAGNOSIS — B351 Tinea unguium: Secondary | ICD-10-CM | POA: Diagnosis not present

## 2019-01-25 DIAGNOSIS — E1142 Type 2 diabetes mellitus with diabetic polyneuropathy: Secondary | ICD-10-CM | POA: Diagnosis not present

## 2019-01-25 DIAGNOSIS — M79675 Pain in left toe(s): Secondary | ICD-10-CM | POA: Diagnosis not present

## 2019-01-25 DIAGNOSIS — L84 Corns and callosities: Secondary | ICD-10-CM

## 2019-01-25 DIAGNOSIS — M79674 Pain in right toe(s): Secondary | ICD-10-CM

## 2019-01-25 NOTE — Patient Instructions (Signed)
Onychomycosis/Fungal Toenails  WHAT IS IT? An infection that lies within the keratin of your nail plate that is caused by a fungus.  WHY ME? Fungal infections affect all ages, sexes, races, and creeds.  There may be many factors that predispose you to a fungal infection such as age, coexisting medical conditions such as diabetes, or an autoimmune disease; stress, medications, fatigue, genetics, etc.  Bottom line: fungus thrives in a warm, moist environment and your shoes offer such a location.  IS IT CONTAGIOUS? Theoretically, yes.  You do not want to share shoes, nail clippers or files with someone who has fungal toenails.  Walking around barefoot in the same room or sleeping in the same bed is unlikely to transfer the organism.  It is important to realize, however, that fungus can spread easily from one nail to the next on the same foot.  HOW DO WE TREAT THIS?  There are several ways to treat this condition.  Treatment may depend on many factors such as age, medications, pregnancy, liver and kidney conditions, etc.  It is best to ask your doctor which options are available to you.  1. No treatment.   Unlike many other medical concerns, you can live with this condition.  However for many people this can be a painful condition and may lead to ingrown toenails or a bacterial infection.  It is recommended that you keep the nails cut short to help reduce the amount of fungal nail. 2. Topical treatment.  These range from herbal remedies to prescription strength nail lacquers.  About 40-50% effective, topicals require twice daily application for approximately 9 to 12 months or until an entirely new nail has grown out.  The most effective topicals are medical grade medications available through physicians offices. 3. Oral antifungal medications.  With an 80-90% cure rate, the most common oral medication requires 3 to 4 months of therapy and stays in your system for a year as the new nail grows out.  Oral  antifungal medications do require blood work to make sure it is a safe drug for you.  A liver function panel will be performed prior to starting the medication and after the first month of treatment.  It is important to have the blood work performed to avoid any harmful side effects.  In general, this medication safe but blood work is required. 4. Laser Therapy.  This treatment is performed by applying a specialized laser to the affected nail plate.  This therapy is noninvasive, fast, and non-painful.  It is not covered by insurance and is therefore, out of pocket.  The results have been very good with a 80-95% cure rate.  The [facility] is the only practice in the area to offer this therapy. Permanent Nail Avulsion.  Removing the entire nail so that a new nail will not grow back.Diabetic Neuropathy Diabetic neuropathy refers to nerve damage that is caused by diabetes (diabetes mellitus). Over time, people with diabetes can develop nerve damage throughout the body. There are several types of diabetic neuropathy:  Peripheral neuropathy. This is the most common type of diabetic neuropathy. It causes damage to nerves that carry signals between the spinal cord and other parts of the body (peripheral nerves). This usually affects nerves in the feet and legs first, and may eventually affect the hands and arms. The damage affects the ability to sense touch or temperature.  Autonomic neuropathy. This type causes damage to nerves that control involuntary functions (autonomic nerves). These nerves carry   signals that control: ? Heartbeat. ? Body temperature. ? Blood pressure. ? Urination. ? Digestion. ? Sweating. ? Sexual function. ? Response to changing blood sugar (glucose) levels.  Focal neuropathy. This type of nerve damage affects one area of the body, such as an arm, a leg, or the face. The injury may involve one nerve or a small group of nerves. Focal neuropathy can be painful and unpredictable,  and occurs most often in older adults with diabetes. This often develops suddenly, but usually improves over time and does not cause long-term problems.  Proximal neuropathy. This type of nerve damage affects the nerves of the thighs, hips, buttocks, or legs. It causes severe pain, weakness, and muscle death (atrophy), usually in the thigh muscles. It is more common among older men and people who have type 2 diabetes. The length of recovery time may vary. What are the causes? Peripheral, autonomic, and focal neuropathies are caused by diabetes that is not well controlled with treatment. The cause of proximal neuropathy is not known, but it may be caused by inflammation related to uncontrolled blood glucose levels. What are the signs or symptoms? Peripheral neuropathy Peripheral neuropathy develops slowly over time. When the nerves of the feet and legs no longer work, you may experience:  Burning, stabbing, or aching pain in the legs or feet.  Pain or cramping in the legs or feet.  Loss of feeling (numbness) and inability to feel pressure or pain in the feet. This can lead to: ? Thick calluses or sores on areas of constant pressure. ? Ulcers. ? Reduced ability to feel temperature changes.  Foot deformities.  Muscle weakness.  Loss of balance or coordination. Autonomic neuropathy The symptoms of autonomic neuropathy vary depending on which nerves are affected. Symptoms may include:  Problems with digestion, such as: ? Nausea or vomiting. ? Poor appetite. ? Bloating. ? Diarrhea or constipation. ? Trouble swallowing. ? Losing weight without trying to.  Problems with the heart, blood and lungs, such as: ? Dizziness, especially when standing up. ? Fainting. ? Shortness of breath. ? Irregular heartbeat.  Bladder problems, such as: ? Trouble starting or stopping urination. ? Leaking urine. ? Trouble emptying the bladder. ? Urinary tract infections (UTIs).  Problems with other  body functions, such as: ? Sweat. You may sweat too much or too little. ? Temperature. You might get hot easily. Or, you might feel cold more than usual. ? Sexual function. Men may not be able to get or maintain an erection. Women may have vaginal dryness and difficulty with arousal. Focal neuropathy Symptoms affect only one area of the body. Common symptoms include:  Numbness.  Tingling.  Burning pain.  Prickling feeling.  Very sensitive skin.  Weakness.  Inability to move (paralysis).  Muscle twitching.  Muscles getting smaller (wasting).  Poor coordination.  Double or blurred vision. Proximal neuropathy  Sudden, severe pain in the hip, thigh, or buttocks. Pain may spread from the back into the legs (sciatica).  Pain and numbness in the arms and legs.  Tingling.  Loss of bladder control or bowel control.  Weakness and wasting of thigh muscles.  Difficulty getting up from a seated position.  Abdominal swelling.  Unexplained weight loss. How is this diagnosed? Diagnosis usually involves reviewing your medical history and any symptoms you have. Diagnosis varies depending on the type of neuropathy your health care provider suspects. Peripheral neuropathy Your health care provider will check areas that are affected by your nervous system (neurologic exam), such as  your reflexes, how you move, and what you can feel. You may have other tests, such as:  Blood tests.  Removal and examination of fluid that surrounds the spinal cord (lumbar puncture).  CT scan.  MRI.  A test to check the nerves that control muscles (electromyogram, EMG).  Tests of how quickly messages pass through your nerves (nerve conduction velocity tests).  Removal of a small piece of nerve to be examined under a microscope (biopsy). Autonomic neuropathy You may have tests, such as:  Tests to measure your blood pressure and heart rate. This may include monitoring you while you are safely  secured to an exam table that moves you from a lying position to an upright position (table tilt test).  Breathing tests to check your lungs.  Tests to check how food moves through the digestive system (gastric emptying tests).  Blood, sweat, or urine tests.  Ultrasound of your bladder.  Spinal fluid tests. Focal neuropathy This condition may be diagnosed with:  A neurologic exam.  CT scan.  MRI.  EMG.  Nerve conduction velocity tests. Proximal neuropathy There is no test to diagnose this type of neuropathy. You may have tests to rule out other possible causes of this type of neuropathy. Tests may include:  X-rays of your spine and lumbar region.  Lumbar puncture.  MRI. How is this treated? The goal of treatment is to keep nerve damage from getting worse. The most important part of treatment is keeping your blood glucose level and your A1C level within your target range by following your diabetes management plan. Over time, maintaining lower blood glucose levels helps lessen symptoms. In some cases, you may need prescription pain medicine. Follow these instructions at home:  Lifestyle   Do not use any products that contain nicotine or tobacco, such as cigarettes and e-cigarettes. If you need help quitting, ask your health care provider.  Be physically active every day. Include strength training and balance exercises.  Follow a healthy meal plan.  Work with your health care provider to manage your blood pressure. General instructions  Follow your diabetes management plan as directed. ? Check your blood glucose levels as directed by your health care provider. ? Keep your blood glucose in your target range as directed by your health care provider. ? Have your A1C level checked at least two times a year, or as often as told by your health care provider.  Take over the counter and prescription medicines only as told by your health care provider. This includes insulin  and diabetes medicine.  Do not drive or use heavy machinery while taking prescription pain medicines.  Check your skin and feet every day for cuts, bruises, redness, blisters, or sores.  Keep all follow up visits as told by your health care provider. This is important. Contact a health care provider if:  You have burning, stabbing, or aching pain in your legs or feet.  You are unable to feel pressure or pain in your feet.  You develop problems with digestion, such as: ? Nausea. ? Vomiting. ? Bloating. ? Constipation. ? Diarrhea. ? Abdominal pain.  You have difficulty with urination, such as inability: ? To control when you urinate (incontinence). ? To completely empty the bladder (retention).  You have palpitations.  You feel dizzy, weak, or faint when you stand up. Get help right away if:  You cannot urinate.  You have sudden weakness or loss of coordination.  You have trouble speaking.  You have pain or   pressure in your chest.  You have an irregular heart beat.  You have sudden inability to move a part of your body. Summary  Diabetic neuropathy refers to nerve damage that is caused by diabetes. It can affect nerves throughout the entire body, causing numbness and pain in the arms, legs, digestive tract, heart, and other body systems.  Keep your blood glucose level and your blood pressure in your target range, as directed by your health care provider. This can help prevent neuropathy from getting worse.  Check your skin and feet every day for cuts, bruises, redness, blisters, or sores.  Do not use any products that contain nicotine or tobacco, such as cigarettes and e-cigarettes. If you need help quitting, ask your health care provider. This information is not intended to replace advice given to you by your health care provider. Make sure you discuss any questions you have with your health care provider. Document Released: 01/25/2002 Document Revised: 12/29/2017  Document Reviewed: 12/21/2016 [employer] Interactive Patient Education  2019 [employer]  

## 2019-01-31 ENCOUNTER — Encounter: Payer: Self-pay | Admitting: Podiatry

## 2019-01-31 NOTE — Progress Notes (Signed)
Subjective: Lindsey Marquez presents today referred by [name], NP with 10 year h/o diabetes and cc of painful, discolored, thick toenails which interfere with daily activities.    She relates she does have tingling under her toes on occasion. She has pins and needles sensation as well.  She relates h/o breast cancer. She had radiation/chemotherapy in 2003. She had recurrence of breast cancer and had chemotherapy and left mastectomy in 2018.   Past Medical History:  Diagnosis Date  . Blood transfusion without reported diagnosis   . Breast cancer [facility]) August 2002   Invasive ductal carcinoma Left breast. 2002, 2017  . Diabetes mellitus without complication ([place])   . Family history of malignant neoplasm of ovary   . Family history of pancreatic cancer   . Hypertension     Patient Active Problem List   Diagnosis Date Noted  . Peripheral edema 12/20/2018  . Hyperlipidemia 09/20/2018  . Port catheter in place 08/17/2016  . History of therapeutic radiation 05/15/2016  . Malignant neoplasm of central portion of left female breast () 05/15/2016  . Cancer of central portion of left breast ([place]) 05/07/2016  . HTN (hypertension) 03/24/2016  . Type 2 diabetes mellitus with hyperglycemia, with long-term current use of insulin ([place]) 03/24/2016    Past Surgical History:  Procedure Laterality Date  . BREAST SURGERY Left 2003  . COLONOSCOPY    . FOOT SURGERY  2000  . INSERTION OF MESH N/A 02/17/2018   Procedure: INSERTION OF MESH;  Surgeon: [name], MD;  Location: [place];  Service: General;  Laterality: N/A;  . MASTECTOMY Left 05/28/2016  . port a cath insertion  05/28/2016  . PORT-A-CATH REMOVAL Right 08/19/2017   Procedure: REMOVAL PORT-A-CATH;  Surgeon: [name], MD;  Location: [place];  Service: General;  Laterality: Right;  . PORTACATH PLACEMENT Right 05/28/2016   Procedure: INSERTION PORT-A-CATH;  Surgeon: [name], MD;  Location: [place];   Service: General;  Laterality: Right;  . TOTAL MASTECTOMY Left 05/28/2016   Procedure: LEFT  MASTECTOMY;  Surgeon: [name], MD;  Location: [place];  Service: General;  Laterality: Left;  . TUBAL LIGATION  22 yrs since  2004.   Bilateral.  . VENTRAL HERNIA REPAIR  02/17/2018   open  . VENTRAL HERNIA REPAIR N/A 02/17/2018   Procedure: OPEN VENTRAL HERNIA REPAIR WITH MESH;  Surgeon: [name], MD;  Location: [place];  Service: General;  Laterality: N/A;     Current Outpatient Medications:  .  NON FORMULARY, Peripheral Neuropathy cream from [place], Disp: , Rfl:  .  aspirin 81 MG tablet, Take 81 mg by mouth daily as needed (leg pain). , Disp: , Rfl:  .  atorvastatin (LIPITOR) 10 MG tablet, Take 0.5 tablets (5 mg total) by mouth daily., Disp: 45 tablet, Rfl: 0 .  BD PEN NEEDLE NANO U/F 32G X 4 MM MISC, USE 1 PEN NEEDLE 2 (TWO) TIMES DAILY., Disp: 200 each, Rfl: 1 .  dapagliflozin propanediol (FARXIGA) 10 MG TABS tablet, Take 10 mg by mouth daily., Disp: 30 tablet, Rfl: 11 .  glucose blood (TRUE METRIX BLOOD GLUCOSE TEST) test strip, 1 each by Other route 3 (three) times daily. Use as instructed, Disp: 600 each, Rfl: 2 .  ibuprofen (ADVIL,MOTRIN) 200 MG tablet, Take 400 mg by mouth every 8 (eight) hours as needed for headache or moderate pain. , Disp: , Rfl:  .  Insulin Glargine (BASAGLAR KWIKPEN) 100 UNIT/ML SOPN, Inject 0.14 mLs (14  Units total) into the skin at bedtime., Disp: 15 mL, Rfl: 11 .  letrozole (FEMARA) 2.5 MG tablet, Take 1 tablet (2.5 mg total) by mouth daily., Disp: 90 tablet, Rfl: 3 .  lisinopril-hydrochlorothiazide (PRINZIDE,ZESTORETIC) 20-25 MG tablet, Take 1 tablet by mouth daily., Disp: 90 tablet, Rfl: 2 .  metFORMIN (GLUCOPHAGE) 1000 MG tablet, Take 1 tablet (1,000 mg total) by mouth 2 (two) times daily with a meal., Disp: 180 tablet, Rfl: 3 .  Omega-3 Fatty Acids (FISH OIL) 1200 MG CAPS, Take 1,200 mg by mouth daily. , Disp: , Rfl:  .  ondansetron  (ZOFRAN-ODT) 4 MG disintegrating tablet, Take 1 tablet (4 mg total) by mouth every 8 (eight) hours as needed for nausea or vomiting., Disp: 20 tablet, Rfl: 1 .  Potassium Chloride ER 20 MEQ TBCR, Take 40 mEq daily by mouth., Disp: 60 tablet, Rfl: 2 .  sitaGLIPtin (JANUVIA) 100 MG tablet, Take 1 tablet (100 mg total) by mouth daily., Disp: 90 tablet, Rfl: 3 .  torsemide (DEMADEX) 20 MG tablet, Take 20 mg by mouth 2 (two) times daily as needed (swelling). , Disp: , Rfl:  .  triamcinolone cream (KENALOG) 0.1 %, APPLY 1 APPLICATION TOPICALLY 2 (TWO) TIMES DAILY TO AFFECTED AREAS, Disp: 80 g, Rfl: 0 .  TRUEPLUS LANCETS 33G MISC, 1 each by Does not apply route 3 (three) times daily., Disp: 600 each, Rfl: 2  Allergies  Allergen Reactions  . Codeine Other (See Comments)    "get crazy", "see things that aren't there"    Social History   Occupational History  . Not on file  Tobacco Use  . Smoking status: Former Smoker    Packs/day: 0.10    Years: 7.00    Pack years: 0.70    Last attempt to quit: 12/01/1975    Years since quitting: 43.1  . Smokeless tobacco: Never Used  Substance and Sexual Activity  . Alcohol use: No    Alcohol/week: 0.0 standard drinks  . Drug use: No  . Sexual activity: Yes    Family History  Problem Relation Age of Onset  . Prostate cancer Father   . Ovarian cancer Maternal Aunt 24       deceased  . Pancreatic cancer Mother 44       deceased  . Cancer Maternal Aunt        2 other mat aunts; unk. primary in 41s; deceased  . Cancer Maternal Uncle        "bone ca"; unk. primary; deceased 72s  . Prostate cancer Maternal Uncle        deceased 47  . Colon cancer Neg Hx   . Esophageal cancer Neg Hx   . Rectal cancer Neg Hx   . Stomach cancer Neg Hx     Immunization History  Administered Date(s) Administered  . Influenza, High Dose Seasonal PF 09/14/2017  . Influenza,inj,Quad PF,6+ Mos 09/07/2016, 09/29/2018  . Influenza-Unspecified 10/31/2015  . Pneumococcal  Conjugate-13 10/04/2017  . Pneumococcal Polysaccharide-23 01/27/2016  . Tdap 09/08/2012  . Zoster 09/08/2012     Review of systems: Positive Findings in bold print.  Constitutional:  chills, fatigue, fever, sweats, weight change Communication: Optometrist, sign Ecologist, hand writing, iPad/Android device Head: headaches, head injury Eyes: changes in vision, eye pain, glaucoma, cataracts, macular degeneration, diplopia, glare,  light sensitivity, eyeglasses or contacts, blindness Ears nose mouth throat: Hard of hearing, ringing in ears, deaf, sign language,  vertigo,   nosebleeds,  rhinitis,  cold sores, snoring, swollen glands  Cardiovascular: HTN, edema, arrhythmia, pacemaker in place, defibrillator in place,  chest pain/tightness, chronic anticoagulation, blood clot, heart failure Peripheral Vascular: leg cramps, varicose veins, blood clots, lymphedema Respiratory:  difficulty breathing, denies congestion, SOB, wheezing, cough, emphysema Gastrointestinal: change in appetite or weight, abdominal pain, constipation, diarrhea, nausea, vomiting, vomiting blood, change in bowel habits, abdominal pain, jaundice, rectal bleeding, hemorrhoids, Genitourinary:  nocturia,  pain on urination,  blood in urine, Foley catheter, urinary urgency Musculoskeletal: uses mobility aid,  cramping, stiff joints, painful joints, decreased joint motion, fractures, OA, gout Skin: +changes in toenails, color change, dryness, itching, mole changes,  rash  Neurological: headaches, numbness in feet, paresthesias in feet, burning in feet, fainting,  seizures, change in speech. denies headaches, memory problems/poor historian, cerebral palsy, weakness, paralysis Endocrine: diabetes, hypothyroidism, hyperthyroidism,  goiter, dry mouth, flushing, heat intolerance,  cold intolerance,  excessive thirst, denies polyuria,  nocturia Hematological:  easy bleeding, excessive bleeding, easy bruising, enlarged lymph nodes, on  long term blood thinner, history of past transusions Allergy/immunological:  hives, eczema, frequent infections, multiple drug allergies, seasonal allergies, transplant recipient Psychiatric:  anxiety, depression, mood disorder, suicidal ideations, hallucinations   Objective: Vascular Examination: Capillary refill time immediate x 10 digits.  Dorsalis pedis and posterior tibial pulses present b/l.  No digital hair x 10 digits.  Skin temperature gradient WNL b/l.   Dermatological Examination: Skin with normal turgor, texture and tone b/l.  Toenails 1-5 b/l discolored, thick, dystrophic with subungual debris and pain with palpation to nailbeds due to thickness of nails.  Hyperkeratotic lesions plantar hallux IPJ b/l,  submet head 4 right foot, submet head 1 b/l  Musculoskeletal: Muscle strength 5/5 to all LE muscle groups.  Hammertoes 2-5 b/l.  Hallux varus left foot.  Neurological: Sensation intact with 10 gram monofilament. Vibratory sensation intact b/l.   Assessment: 1. Painful onychomycosis toenails 1-5 b/l  2. Calluses plantar hallux IPJ b/l,  submet head 4 right foot, submet head 1 b/l 3. NIDDM with subjective diabetic neuropathy  Plan: 1. Discussed diabetic foot care principles. Literature dispensed on today. 2. Toenails 1-5 b/l were debrided in length and girth without iatrogenic bleeding. Calluses pared submet head 4 right foot, submet head 1 b/l and plantar hallux IPJ b/l without incident. For neuropathy, a prescription was sent to [facility] for peripheral neuropathy cream which consists of: Bupivacaine 1%, doxepin 3%, gabapentin 6%, pentoxifylline 3%, and Topimarate 1%. Apply 1-2 gm to feet 3-4 times/day. Patient to continue soft, supportive shoe gear daily. Start process for diabetic shoes. She qualifies based on examination on today with supporting documentation of NIDDM with neuropathy, calluses submet head 4 right foot, submet head 1 b/l and plantar  hallux IPJ b/l, hammertoes 2-5 b/l and hallux varus left foot. Patient to report any pedal injuries to medical professional immediately. Follow up 3 months. Patient/POA to call should there be a concern in the interim.

## 2019-03-20 ENCOUNTER — Other Ambulatory Visit: Payer: Self-pay | Admitting: Internal Medicine

## 2019-03-20 DIAGNOSIS — Z1231 Encounter for screening mammogram for malignant neoplasm of breast: Secondary | ICD-10-CM

## 2019-03-27 ENCOUNTER — Ambulatory Visit: Payer: Medicare HMO | Admitting: Orthotics

## 2019-03-27 ENCOUNTER — Other Ambulatory Visit: Payer: Self-pay

## 2019-03-27 DIAGNOSIS — M79675 Pain in left toe(s): Principal | ICD-10-CM

## 2019-03-27 DIAGNOSIS — B351 Tinea unguium: Secondary | ICD-10-CM

## 2019-03-27 DIAGNOSIS — E1142 Type 2 diabetes mellitus with diabetic polyneuropathy: Secondary | ICD-10-CM

## 2019-03-27 DIAGNOSIS — M79674 Pain in right toe(s): Principal | ICD-10-CM

## 2019-03-27 DIAGNOSIS — L84 Corns and callosities: Secondary | ICD-10-CM

## 2019-03-27 NOTE — Progress Notes (Signed)

## 2019-03-29 ENCOUNTER — Other Ambulatory Visit: Payer: Self-pay

## 2019-03-29 ENCOUNTER — Other Ambulatory Visit (INDEPENDENT_AMBULATORY_CARE_PROVIDER_SITE_OTHER): Payer: Medicare HMO

## 2019-03-29 DIAGNOSIS — E7800 Pure hypercholesterolemia, unspecified: Secondary | ICD-10-CM

## 2019-03-29 DIAGNOSIS — I10 Essential (primary) hypertension: Secondary | ICD-10-CM

## 2019-03-29 LAB — LIPID PANEL
Cholesterol: 139 mg/dL (ref 0–200)
HDL: 43 mg/dL (ref >39.00)
LDL Cholesterol: 66 mg/dL (ref 0–99)
NonHDL: 96.3
Total CHOL/HDL Ratio: 3
Triglycerides: 154 mg/dL — ABNORMAL HIGH (ref 0.0–149.0)
VLDL: 30.8 mg/dL (ref 0.0–40.0)

## 2019-03-29 LAB — COMPREHENSIVE METABOLIC PANEL
ALT: 43 U/L — ABNORMAL HIGH (ref 0–35)
AST: 43 U/L — ABNORMAL HIGH (ref 0–37)
Albumin: 4.3 g/dL (ref 3.5–5.2)
Alkaline Phosphatase: 65 U/L (ref 39–117)
BUN: 18 mg/dL (ref 6–23)
CO2: 26 mEq/L (ref 19–32)
Calcium: 9.2 mg/dL (ref 8.4–10.5)
Chloride: 100 mEq/L (ref 96–112)
Creatinine, Ser: 0.8 mg/dL (ref 0.40–1.20)
GFR: 86.35 mL/min (ref >60.00)
Glucose, Bld: 215 mg/dL — ABNORMAL HIGH (ref 70–99)
Potassium: 3.8 mEq/L (ref 3.5–5.1)
Sodium: 136 mEq/L (ref 135–145)
Total Bilirubin: 0.6 mg/dL (ref 0.2–1.2)
Total Protein: 7.8 g/dL (ref 6.0–8.3)

## 2019-04-05 ENCOUNTER — Other Ambulatory Visit: Payer: Self-pay

## 2019-04-05 ENCOUNTER — Inpatient Hospital Stay: Payer: Medicare HMO

## 2019-04-05 ENCOUNTER — Inpatient Hospital Stay: Payer: Medicare HMO | Attending: Hematology

## 2019-04-05 DIAGNOSIS — Z9221 Personal history of antineoplastic chemotherapy: Secondary | ICD-10-CM | POA: Insufficient documentation

## 2019-04-05 DIAGNOSIS — Z923 Personal history of irradiation: Secondary | ICD-10-CM | POA: Diagnosis not present

## 2019-04-05 DIAGNOSIS — I10 Essential (primary) hypertension: Secondary | ICD-10-CM | POA: Insufficient documentation

## 2019-04-05 DIAGNOSIS — E1140 Type 2 diabetes mellitus with diabetic neuropathy, unspecified: Secondary | ICD-10-CM | POA: Diagnosis not present

## 2019-04-05 DIAGNOSIS — D649 Anemia, unspecified: Secondary | ICD-10-CM | POA: Diagnosis not present

## 2019-04-05 DIAGNOSIS — C50112 Malignant neoplasm of central portion of left female breast: Secondary | ICD-10-CM | POA: Diagnosis not present

## 2019-04-05 DIAGNOSIS — Z9012 Acquired absence of left breast and nipple: Secondary | ICD-10-CM | POA: Insufficient documentation

## 2019-04-05 DIAGNOSIS — Z79811 Long term (current) use of aromatase inhibitors: Secondary | ICD-10-CM | POA: Diagnosis not present

## 2019-04-05 DIAGNOSIS — E669 Obesity, unspecified: Secondary | ICD-10-CM | POA: Diagnosis not present

## 2019-04-05 DIAGNOSIS — Z170 Estrogen receptor positive status [ER+]: Secondary | ICD-10-CM | POA: Insufficient documentation

## 2019-04-05 LAB — COMPREHENSIVE METABOLIC PANEL
ALT: 39 U/L (ref 0–44)
AST: 34 U/L (ref 15–41)
Albumin: 4 g/dL (ref 3.5–5.0)
Alkaline Phosphatase: 73 U/L (ref 38–126)
Anion gap: 11 (ref 5–15)
BUN: 18 mg/dL (ref 8–23)
CO2: 24 mmol/L (ref 22–32)
Calcium: 9.5 mg/dL (ref 8.9–10.3)
Chloride: 104 mmol/L (ref 98–111)
Creatinine, Ser: 0.99 mg/dL (ref 0.44–1.00)
GFR calc Af Amer: >60 mL/min (ref >60)
GFR calc non Af Amer: 59 mL/min — ABNORMAL LOW (ref >60)
Glucose, Bld: 215 mg/dL — ABNORMAL HIGH (ref 70–99)
Potassium: 4.2 mmol/L (ref 3.5–5.1)
Sodium: 139 mmol/L (ref 135–145)
Total Bilirubin: 0.5 mg/dL (ref 0.3–1.2)
Total Protein: 8.1 g/dL (ref 6.5–8.1)

## 2019-04-05 LAB — CBC WITH DIFFERENTIAL (CANCER CENTER ONLY)
Abs Immature Granulocytes: 0.02 10*3/uL (ref 0.00–0.07)
Basophils Absolute: 0 10*3/uL (ref 0.0–0.1)
Basophils Relative: 1 %
Eosinophils Absolute: 0.2 10*3/uL (ref 0.0–0.5)
Eosinophils Relative: 2 %
HCT: 36.5 % (ref 36.0–46.0)
Hemoglobin: 12.1 g/dL (ref 12.0–15.0)
Immature Granulocytes: 0 %
Lymphocytes Relative: 31 %
Lymphs Abs: 2.3 10*3/uL (ref 0.7–4.0)
MCH: 33.2 pg (ref 26.0–34.0)
MCHC: 33.2 g/dL (ref 30.0–36.0)
MCV: 100 fL (ref 80.0–100.0)
Monocytes Absolute: 0.6 10*3/uL (ref 0.1–1.0)
Monocytes Relative: 7 %
Neutro Abs: 4.4 10*3/uL (ref 1.7–7.7)
Neutrophils Relative %: 59 %
Platelet Count: 291 10*3/uL (ref 150–400)
RBC: 3.65 MIL/uL — ABNORMAL LOW (ref 3.87–5.11)
RDW: 12.5 % (ref 11.5–15.5)
WBC Count: 7.5 10*3/uL (ref 4.0–10.5)
nRBC: 0 % (ref 0.0–0.2)

## 2019-04-07 ENCOUNTER — Ambulatory Visit: Payer: Medicare HMO | Admitting: Internal Medicine

## 2019-04-10 ENCOUNTER — Telehealth: Payer: Self-pay | Admitting: Hematology

## 2019-04-10 NOTE — Telephone Encounter (Signed)
Scheduled webex appt per sch msg. Patient will be contacted

## 2019-04-14 DIAGNOSIS — C50912 Malignant neoplasm of unspecified site of left female breast: Secondary | ICD-10-CM | POA: Diagnosis not present

## 2019-04-17 ENCOUNTER — Ambulatory Visit: Payer: Medicare HMO | Admitting: Orthotics

## 2019-04-17 ENCOUNTER — Other Ambulatory Visit: Payer: Self-pay

## 2019-04-17 DIAGNOSIS — E1165 Type 2 diabetes mellitus with hyperglycemia: Secondary | ICD-10-CM

## 2019-04-17 DIAGNOSIS — L84 Corns and callosities: Secondary | ICD-10-CM

## 2019-04-17 DIAGNOSIS — E1142 Type 2 diabetes mellitus with diabetic polyneuropathy: Secondary | ICD-10-CM

## 2019-04-17 DIAGNOSIS — Z794 Long term (current) use of insulin: Secondary | ICD-10-CM

## 2019-04-17 NOTE — Progress Notes (Signed)
Patient did not like shoes (too heavy); reordered a lighter shoe

## 2019-04-20 ENCOUNTER — Telehealth: Payer: Self-pay | Admitting: Hematology

## 2019-04-20 NOTE — Telephone Encounter (Signed)
Called patient regarding upcoming Webex appointment, patient is notified and e-mail has been sent. °

## 2019-04-21 NOTE — Progress Notes (Signed)
[place]   [phone] [phone]   Clinic Follow up Note   Patient Care Team: [name], NP as PCP - General (Internal Medicine) [name], MD as Consulting Physician (General Surgery) [name], MD as Consulting Physician (Hematology) Delice Bison Charlestine Massed, NP as Nurse Practitioner (Hematology and Oncology)   I connected with Lindsey Marquez on 04/26/2019 at  1:00 PM EDT by video enabled telemedicine visit and verified that I am speaking with the correct person using two identifiers.  I discussed the limitations, risks, security and privacy concerns of performing an evaluation and management service by telephone and the availability of in person appointments. I also discussed with the patient that there may be a patient responsible charge related to this service. The patient expressed understanding and agreed to proceed.   Patient's location:  Her home  Provider's location:  My Office   CHIEF COMPLAINT: Follow up recurrent left breast cancer   SUMMARY OF ONCOLOGIC HISTORY: 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 [facility])   Staging form: Breast, AJCC 7th Edition     Clinical stage from 04/17/2016: Stage IA (T1c, N0, M0) - Signed by [name], MD on 05/07/2016     Pathologic stage from 05/28/2016: Stage IIA (T2, N0, cM0) - Signed by [name], MD on 06/11/2016 History of left breast cancer   Staging form: Breast, AJCC 7th Edition     Clinical: Stage IIIB (T4d, N1, cM0) - Signed by [name], MD on 12/14/2013     Pathologic: Stage IIIB (T4d, N1a, cM0) - Signed by [name], MD on 12/14/2013       History of left breast cancer (Resolved)   09/12/2002 Imaging    CT scan show no evidence of disease apart from the left axilla    09/2002 -  Neo-Adjuvant Chemotherapy    TAC X4 cycles     01/10/2003 Surgery    The patient had left lumpectomy and axillary lymph node dissection which show residual breast cancer and a  sentinel lymph node was positive    2004 -  Adjuvant Chemotherapy    Cytoxan with 5-FU x4 cycles (methotrexate added at cycle 4).    2004 -  Radiation Therapy    Adjuvant left breast radiation    12/2002 Receptors her2    ER, PR and HER-2 were all negative.     Cancer of central portion of left breast ([place])   04/13/2016 Mammogram    Scheduler coarse heterogeneous calcifications spanning an area of 3.7 cm in the lumpectomy site, indeterminate. Ultrasound of the left axilla was negative.    04/17/2016 Initial Diagnosis    Cancer of central portion of left breast ([place])    04/17/2016 Initial Biopsy    Left breast core needle biopsy showed invasive and in situ ductal carcinoma with calcification, grade 2.    04/17/2016 Receptors her2    ER 100% positive, PR 15% positive, strong staining, Ki-67 10%, HER-2 positive by IHC (3)    05/05/2016 Imaging    Bilateral breast MRI showed a 1.3 x 0.8 x 0.7 cm biopsy-proven ductal carcinoma in the region of the previous lumpectomy. Enlarged lobulated right inferior axillary lymph node with diffuse cortical thickening, biopsy is recommended.    05/15/2016 Pathology Results    right axilary node biopsy was negative for malignant cell     05/28/2016 Surgery    Simple left mastectomy    05/28/2016 Pathology Results    Left  mastectomy showed invasive ductal carcinoma, grade 3, 3.6 cm, high grade DCIS, (+) LVI, surgical margins were negative. Tumor 3.6 cm, no lymph nodes identified.    06/25/2016 Imaging    Staging CT scan of the chest, abdomen and pelvis with contrast and a bone scan showed no evidence of metastasis. Postoperative fluid collection in the left breast, likely a seroma or hematoma. Right axillary adenopathy, which was biopsied previously.     07/07/2016 - 07/15/2017 Chemotherapy    Adjuvant chemotherapy with docetaxel, carboplatin, Herceptin and pejeta every 3 weeks, for 6 cycles, followed by Herceptin maintenance therapy for total of one year  treatment  Ended [facility] 11/04/16      12/15/2016 -  Anti-estrogen oral therapy    Adjuvant letrozole 2.5 mg once daily    04/29/2017 Mammogram     Mammogram 04/29/2017 IMPRESSION: No mammographic evidence of malignancy. A result letter of this screening mammogram will be mailed directly to the patient.     08/26/2017 - 07/2018 Antibody Plan    She started Nerlynx 285m on 08/26/17, was held on 09/14/17 due to significant diarrhea and restarted when her diarrhea resolved on 09/21/17. Completed in 07/2018    09/22/2017 Procedure    Colonoscopy by Dr. NSilverio Decamp IMPRESSION: - One 3 mm polyp in the transverse colon, removed with a cold biopsy forceps. Resected and retrieved. - Non-bleeding internal hemorrhoids.    09/22/2017 Pathology Results    Diagnosis, colonoscopy  Surgical [P], transverse, polyp - TUBULAR ADENOMA(S). - HIGH GRADE DYSPLASIA IS NOT IDENTIFIED.      CURRENT THERAPY:  -Letrozole 2.5 mg daily, started on 12/15/2016   INTERVAL HISTORY:  Lindsey TEGELERis here for a follow up of left breast cancer. She was able to identify herself by face to face video. She notes she is doing well. She notes no new changes. She is taking Letrozole and tolerating well. She notes she will need a refill.  She notes her BG fasting 180s and her last A1c was 9.8. She note she has tightness of her legs which she wears stocking for. She feels this is MSK related. She notes neuropathy in her feet and legs, not in her hands.     REVIEW OF SYSTEMS:   Constitutional: Denies fevers, chills or abnormal weight loss Eyes: Denies blurriness of vision Ears, nose, mouth, throat, and face: Denies mucositis or sore throat Respiratory: Denies cough, dyspnea or wheezes Cardiovascular: Denies palpitation, chest discomfort or lower extremity swelling Gastrointestinal:  Denies nausea, heartburn or change in bowel habits Skin: Denies abnormal skin rashes Lymphatics: Denies new lymphadenopathy or easy  bruising Neurological: (+) Neuropathy in her legs and feet Behavioral/Psych: Mood is stable, no new changes  All other systems were reviewed with the patient and are negative.  MEDICAL HISTORY:  Past Medical History:  Diagnosis Date  . Blood transfusion without reported diagnosis   . Breast cancer ([facility] August 2002   Invasive ductal carcinoma Left breast. 2002, 2017  . [place]   . Family history of malignant neoplasm of ovary   . Family history of pancreatic cancer   . Hypertension     SURGICAL HISTORY: Past Surgical History:  Procedure Laterality Date  . BREAST SURGERY Left 2003  . COLONOSCOPY    . FOOT SURGERY  2000  . INSERTION OF MESH N/A 02/17/2018   Procedure: INSERTION OF MESH;  Surgeon: TDonnie Mesa MD;  [place]  Service: General;  Laterality: N/A;  . MASTECTOMY Left 05/28/2016  .  port a cath insertion  05/28/2016  . PORT-A-CATH REMOVAL Right 08/19/2017   Procedure: REMOVAL PORT-A-CATH;  Surgeon: [name], MD;  Location: [place];  Service: General;  Laterality: Right;  . PORTACATH PLACEMENT Right 05/28/2016   Procedure: INSERTION PORT-A-CATH;  Surgeon: [name], MD;  Location: [place];  Service: General;  Laterality: Right;  . TOTAL MASTECTOMY Left 05/28/2016   Procedure: LEFT  MASTECTOMY;  Surgeon: [name], MD;  Location: [place];  Service: General;  Laterality: Left;  . TUBAL LIGATION  22 yrs since  2004.   Bilateral.  . VENTRAL HERNIA REPAIR  02/17/2018   open  . VENTRAL HERNIA REPAIR N/A 02/17/2018   Procedure: OPEN VENTRAL HERNIA REPAIR WITH MESH;  Surgeon: [name], MD;  Location: [place];  Service: General;  Laterality: N/A;    I have reviewed the social history and family history with the patient and they are unchanged from previous note.  ALLERGIES:  is allergic to codeine.  MEDICATIONS:  Current Outpatient Medications  Medication Sig Dispense Refill  . aspirin 81 MG tablet  Take 81 mg by mouth daily as needed (leg pain).     Marland Kitchen atorvastatin (LIPITOR) 10 MG tablet Take 0.5 tablets (5 mg total) by mouth daily. 45 tablet 0  . BD PEN NEEDLE NANO U/F 32G X 4 MM MISC USE 1 PEN NEEDLE 2 (TWO) TIMES DAILY. 200 each 1  . dapagliflozin propanediol (FARXIGA) 10 MG TABS tablet Take 10 mg by mouth daily. 30 tablet 11  . gabapentin (NEURONTIN) 100 MG capsule Take 1 capsule (100 mg total) by mouth at bedtime. 90 capsule 2  . glucose blood (TRUE METRIX BLOOD GLUCOSE TEST) test strip 1 each by Other route 3 (three) times daily. Use as instructed 600 each 2  . ibuprofen (ADVIL,MOTRIN) 200 MG tablet Take 400 mg by mouth every 8 (eight) hours as needed for headache or moderate pain.     . Insulin Glargine (BASAGLAR KWIKPEN) 100 UNIT/ML SOPN Inject 0.14 mLs (14 Units total) into the skin at bedtime. 15 mL 11  . letrozole (FEMARA) 2.5 MG tablet Take 1 tablet (2.5 mg total) by mouth daily. 90 tablet 3  . lisinopril-hydrochlorothiazide (PRINZIDE,ZESTORETIC) 20-25 MG tablet Take 1 tablet by mouth daily. 90 tablet 2  . metFORMIN (GLUCOPHAGE) 1000 MG tablet Take 1 tablet (1,000 mg total) by mouth 2 (two) times daily with a meal. 180 tablet 3  . NON FORMULARY Peripheral Neuropathy cream from [place]    . Omega-3 Fatty Acids (FISH OIL) 1200 MG CAPS Take 1,200 mg by mouth daily.     . ondansetron (ZOFRAN-ODT) 4 MG disintegrating tablet Take 1 tablet (4 mg total) by mouth every 8 (eight) hours as needed for nausea or vomiting. 20 tablet 1  . Potassium Chloride ER 20 MEQ TBCR Take 40 mEq daily by mouth. 60 tablet 2  . sitaGLIPtin (JANUVIA) 100 MG tablet Take 1 tablet (100 mg total) by mouth daily. 90 tablet 3  . torsemide (DEMADEX) 20 MG tablet Take 20 mg by mouth 2 (two) times daily as needed (swelling).     . triamcinolone cream (KENALOG) 0.1 % APPLY 1 APPLICATION TOPICALLY 2 (TWO) TIMES DAILY TO AFFECTED AREAS 80 g 0  . TRUEPLUS LANCETS 33G MISC 1 each by Does not apply route 3 (three)  times daily. 600 each 2   No current facility-administered medications for this visit.     PHYSICAL EXAMINATION: ECOG PERFORMANCE STATUS: 0 - Asymptomatic  No vitals  taken today, Exam not performed today  LABORATORY DATA:  I have reviewed the data as listed CBC Latest Ref Rng & Units 04/05/2019 12/20/2018 12/01/2018  WBC 4.0 - 10.5 K/uL 7.5 7.4 7.2  Hemoglobin 12.0 - 15.0 g/dL 12.1 12.5 11.8(L)  Hematocrit 36.0 - 46.0 % 36.5 37.2 35.8(L)  Platelets 150 - 400 K/uL 291 350.0 308     CMP Latest Ref Rng & Units 04/05/2019 03/29/2019 12/20/2018  Glucose 70 - 99 mg/dL 215(H) 215(H) 211(H)  BUN 8 - 23 mg/dL _0 Creatinine 0.44 - 1.00 mg/dL 0.99 0.80 0.95  Sodium 135 - 145 mmol/L 139 136 137  Potassium 3.5 - 5.1 mmol/L 4.2 3.8 3.8  Chloride 98 - 111 mmol/L 104 100 102  CO2 22 - 32 mmol/L _1 Calcium 8.9 - 10.3 mg/dL 9.5 9.2 9.7  Total Protein 6.5 - 8.1 g/dL 8.1 7.8 7.7  Total Bilirubin 0.3 - 1.2 mg/dL 0.5 0.6 0.6  Alkaline Phos 38 - 126 U/L 73 65 71  AST 15 - 41 U/L 34 43(H) 33  ALT 0 - 44 U/L 39 43(H) 33      RADIOGRAPHIC STUDIES: I have personally reviewed the radiological images as listed and agreed with the findings in the report. No results found.   ASSESSMENT & PLAN:  Lindsey Marquez is a 68 y.o. female with   1. 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 She was treated with Neo-adjuvant chemo, left breast lumpectomy, adjuvant chemo and Radiation.  -She had another left breast cancer in 03/2016. She was treated with left mastectomy, adjuvant chemo TCHP and Nerlynx.  -She started letrozole in 11/2016. Tolerating well.  -She is clinically doing well. Lab from 04/05/19 reviewed, her CBC and CMP are within normal limits except BG at 215. Her 04/2018 mammogram was unremarkable. There is no clinical concern for recurrence. -Continue surveillance. Next Mammogram in 05/2019.  -Continue  Letrozole  -F/u in 3-4 months   2. Anemia  -Secondary to chemotherapy  -resolved on 04/05/19 labs   3. DM and HTN  -She'll continue follow-up with her primary care physician -previously I strongly encouraged her to monitor her blood pressure and glucose at home, and consider increasing the dose of metformin and glyburide if needed -Her A1c continues to worsen to 9.8 on 12/20/18. Her DM is not well controlled.  -We discussed following up with PCP about medication to control her DM.  -I also encouraged her to lose some weight which can help her DM.   4. 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 She will continue to wear compression socks. Will call in Gabapentin to start at 155m night and increase weekly by [id number]  5. Obesity  -I again encouraged her to eat healthy and exercise regularly, she is willing to lose some weight after she completes chemotherapy.  6. Bone Health -We previously discussed that Letrozole can cause some bone weakening  -Her Bone density scan from 04/29/17 her Femur Neck Right T-score is -0.8. This patient is considered normal. -I encouraged her to take calcium and vitamin D. We'll monitor her Vit D levels. -Will repeat DEXA in 05/2019     Plan -I called in Gabapentin, she will start at 100m41m, and gradually increase to 300mg19m-Continue letrozole daily, refilled today   -  Lab and f/u in 3-4 months  -DEXA and mammogram in 05/2019 at [facility]     No problem-specific Assessment & Plan notes found for this encounter.   Orders Placed This Encounter  Procedures  . DG Bone Density    Standing Status:   Future    Standing Expiration Date:   04/25/2020    Scheduling Instructions:     On same day of her mammogram    Order Specific Question:   Reason for Exam (SYMPTOM  OR DIAGNOSIS REQUIRED)    Answer:   screening    Order Specific Question:   Preferred  imaging location?    Answer:   [facility]   I discussed the assessment and treatment plan with the patient. The patient was provided an opportunity to ask questions and all were answered. The patient agreed with the plan and demonstrated an understanding of the instructions.  The patient was advised to call back or seek an in-person evaluation if the symptoms worsen or if the condition fails to improve as anticipated.  I provided 15 minutes of face-to-face video visit time during this encounter, and > 50% was spent counseling as documented under my assessment & plan.    [name], MD 04/26/2019   I, Joslyn Devon, am acting as scribe for [name], MD.   I have reviewed the above documentation for accuracy and completeness, and I agree with the above.

## 2019-04-26 ENCOUNTER — Encounter: Payer: Self-pay | Admitting: Hematology

## 2019-04-26 ENCOUNTER — Encounter: Payer: Self-pay | Admitting: Podiatry

## 2019-04-26 ENCOUNTER — Ambulatory Visit (INDEPENDENT_AMBULATORY_CARE_PROVIDER_SITE_OTHER): Payer: Medicare HMO | Admitting: Podiatry

## 2019-04-26 ENCOUNTER — Inpatient Hospital Stay (HOSPITAL_BASED_OUTPATIENT_CLINIC_OR_DEPARTMENT_OTHER): Payer: Medicare HMO | Admitting: Hematology

## 2019-04-26 ENCOUNTER — Other Ambulatory Visit: Payer: Self-pay

## 2019-04-26 VITALS — Temp 97.2°F

## 2019-04-26 DIAGNOSIS — I10 Essential (primary) hypertension: Secondary | ICD-10-CM | POA: Diagnosis not present

## 2019-04-26 DIAGNOSIS — Z7982 Long term (current) use of aspirin: Secondary | ICD-10-CM | POA: Diagnosis not present

## 2019-04-26 DIAGNOSIS — C50112 Malignant neoplasm of central portion of left female breast: Secondary | ICD-10-CM

## 2019-04-26 DIAGNOSIS — M79675 Pain in left toe(s): Secondary | ICD-10-CM

## 2019-04-26 DIAGNOSIS — Q828 Other specified congenital malformations of skin: Secondary | ICD-10-CM | POA: Diagnosis not present

## 2019-04-26 DIAGNOSIS — B351 Tinea unguium: Secondary | ICD-10-CM | POA: Diagnosis not present

## 2019-04-26 DIAGNOSIS — Z170 Estrogen receptor positive status [ER+]: Secondary | ICD-10-CM | POA: Diagnosis not present

## 2019-04-26 DIAGNOSIS — Z79899 Other long term (current) drug therapy: Secondary | ICD-10-CM

## 2019-04-26 DIAGNOSIS — M79674 Pain in right toe(s): Secondary | ICD-10-CM

## 2019-04-26 DIAGNOSIS — E2839 Other primary ovarian failure: Secondary | ICD-10-CM

## 2019-04-26 DIAGNOSIS — E1165 Type 2 diabetes mellitus with hyperglycemia: Secondary | ICD-10-CM | POA: Diagnosis not present

## 2019-04-26 DIAGNOSIS — Z79811 Long term (current) use of aromatase inhibitors: Secondary | ICD-10-CM | POA: Diagnosis not present

## 2019-04-26 DIAGNOSIS — Z794 Long term (current) use of insulin: Secondary | ICD-10-CM | POA: Diagnosis not present

## 2019-04-26 DIAGNOSIS — E1142 Type 2 diabetes mellitus with diabetic polyneuropathy: Secondary | ICD-10-CM

## 2019-04-26 MED ORDER — GABAPENTIN 100 MG PO CAPS: 100.0000 mg | ORAL_CAPSULE | Freq: Every day | ORAL | 2 refills | Status: DC

## 2019-04-26 MED ORDER — LETROZOLE 2.5 MG PO TABS: 2.5000 mg | ORAL_TABLET | Freq: Every day | ORAL | 3 refills | Status: DC

## 2019-04-26 NOTE — Patient Instructions (Signed)
Diabetes Mellitus and Foot Care °Foot care is an important part of your health, especially when you have diabetes. Diabetes may cause you to have problems because of poor blood flow (circulation) to your feet and legs, which can cause your skin to: °· Become thinner and drier. °· Break more easily. °· Heal more slowly. °· Peel and crack. °You may also have nerve damage (neuropathy) in your legs and feet, causing decreased feeling in them. This means that you may not notice minor injuries to your feet that could lead to more serious problems. Noticing and addressing any potential problems early is the best way to prevent future foot problems. °How to care for your feet °Foot hygiene °· Wash your feet daily with warm water and mild soap. Do not use hot water. Then, pat your feet and the areas between your toes until they are completely dry. Do not soak your feet as this can dry your skin. °· Trim your toenails straight across. Do not dig under them or around the cuticle. File the edges of your nails with an emery board or nail file. °· Apply a moisturizing lotion or petroleum jelly to the skin on your feet and to dry, brittle toenails. Use lotion that does not contain alcohol and is unscented. Do not apply lotion between your toes. °Shoes and socks °· Wear clean socks or stockings every day. Make sure they are not too tight. Do not wear knee-high stockings since they may decrease blood flow to your legs. °· Wear shoes that fit properly and have enough cushioning. Always look in your shoes before you put them on to be sure there are no objects inside. °· To break in new shoes, wear them for just a few hours a day. This prevents injuries on your feet. °Wounds, scrapes, corns, and calluses °· Check your feet daily for blisters, cuts, bruises, sores, and redness. If you cannot see the bottom of your feet, use a mirror or ask someone for help. °· Do not cut corns or calluses or try to remove them with medicine. °· If you  find a minor scrape, cut, or break in the skin on your feet, keep it and the skin around it clean and dry. You may clean these areas with mild soap and water. Do not clean the area with peroxide, alcohol, or iodine. °· If you have a wound, scrape, corn, or callus on your foot, look at it several times a day to make sure it is healing and not infected. Check for: °? Redness, swelling, or pain. °? Fluid or blood. °? Warmth. °? Pus or a bad smell. °General instructions °· Do not cross your legs. This may decrease blood flow to your feet. °· Do not use heating pads or hot water bottles on your feet. They may burn your skin. If you have lost feeling in your feet or legs, you may not know this is happening until it is too late. °· Protect your feet from hot and cold by wearing shoes, such as at the beach or on hot pavement. °· Schedule a complete foot exam at least once a year (annually) or more often if you have foot problems. If you have foot problems, report any cuts, sores, or bruises to your health care provider immediately. °Contact a health care provider if: °· You have a medical condition that increases your risk of infection and you have any cuts, sores, or bruises on your feet. °· You have an injury that is not   healing. °· You have redness on your legs or feet. °· You feel burning or tingling in your legs or feet. °· You have pain or cramps in your legs and feet. °· Your legs or feet are numb. °· Your feet always feel cold. °· You have pain around a toenail. °Get help right away if: °· You have a wound, scrape, corn, or callus on your foot and: °? You have pain, swelling, or redness that gets worse. °? You have fluid or blood coming from the wound, scrape, corn, or callus. °? Your wound, scrape, corn, or callus feels warm to the touch. °? You have pus or a bad smell coming from the wound, scrape, corn, or callus. °? You have a fever. °? You have a red line going up your leg. °Summary °· Check your feet every day  for cuts, sores, red spots, swelling, and blisters. °· Moisturize feet and legs daily. °· Wear shoes that fit properly and have enough cushioning. °· If you have foot problems, report any cuts, sores, or bruises to your health care provider immediately. °· Schedule a complete foot exam at least once a year (annually) or more often if you have foot problems. °This information is not intended to replace advice given to you by your health care provider. Make sure you discuss any questions you have with your health care provider. °Document Released: 11/13/2000 Document Revised: 12/29/2017 Document Reviewed: 12/18/2016 °Elsevier Interactive Patient Education © 2019 [employer] ° °Diabetic Neuropathy °Diabetic neuropathy refers to nerve damage that is caused by diabetes (diabetes mellitus). Over time, people with diabetes can develop nerve damage throughout the body. There are several types of diabetic neuropathy: °· Peripheral neuropathy. This is the most common type of diabetic neuropathy. It causes damage to nerves that carry signals between the spinal cord and other parts of the body (peripheral nerves). This usually affects nerves in the feet and legs first, and may eventually affect the hands and arms. The damage affects the ability to sense touch or temperature. °· Autonomic neuropathy. This type causes damage to nerves that control involuntary functions (autonomic nerves). These nerves carry signals that control: °? Heartbeat. °? Body temperature. °? Blood pressure. °? Urination. °? Digestion. °? Sweating. °? Sexual function. °? Response to changing blood sugar (glucose) levels. °· Focal neuropathy. This type of nerve damage affects one area of the body, such as an arm, a leg, or the face. The injury may involve one nerve or a small group of nerves. Focal neuropathy can be painful and unpredictable, and occurs most often in older adults with diabetes. This often develops suddenly, but usually improves over time  and does not cause long-term problems. °· Proximal neuropathy. This type of nerve damage affects the nerves of the thighs, hips, buttocks, or legs. It causes severe pain, weakness, and muscle death (atrophy), usually in the thigh muscles. It is more common among older men and people who have type 2 diabetes. The length of recovery time may vary. °What are the causes? °Peripheral, autonomic, and focal neuropathies are caused by diabetes that is not well controlled with treatment. The cause of proximal neuropathy is not known, but it may be caused by inflammation related to uncontrolled blood glucose levels. °What are the signs or symptoms? °Peripheral neuropathy °Peripheral neuropathy develops slowly over time. When the nerves of the feet and legs no longer work, you may experience: °· Burning, stabbing, or aching pain in the legs or feet. °· Pain or cramping in the   legs or feet. °· Loss of feeling (numbness) and inability to feel pressure or pain in the feet. This can lead to: °? Thick calluses or sores on areas of constant pressure. °? Ulcers. °? Reduced ability to feel temperature changes. °· Foot deformities. °· Muscle weakness. °· Loss of balance or coordination. °Autonomic neuropathy °The symptoms of autonomic neuropathy vary depending on which nerves are affected. Symptoms may include: °· Problems with digestion, such as: °? Nausea or vomiting. °? Poor appetite. °? Bloating. °? Diarrhea or constipation. °? Trouble swallowing. °? Losing weight without trying to. °· Problems with the heart, blood and lungs, such as: °? Dizziness, especially when standing up. °? Fainting. °? Shortness of breath. °? Irregular heartbeat. °· Bladder problems, such as: °? Trouble starting or stopping urination. °? Leaking urine. °? Trouble emptying the bladder. °? Urinary tract infections (UTIs). °· Problems with other body functions, such as: °? Sweat. You may sweat too much or too little. °? Temperature. You might get hot easily.  Or, you might feel cold more than usual. °? Sexual function. Men may not be able to get or maintain an erection. Women may have vaginal dryness and difficulty with arousal. °Focal neuropathy °Symptoms affect only one area of the body. Common symptoms include: °· Numbness. °· Tingling. °· Burning pain. °· Prickling feeling. °· Very sensitive skin. °· Weakness. °· Inability to move (paralysis). °· Muscle twitching. °· Muscles getting smaller (wasting). °· Poor coordination. °· Double or blurred vision. °Proximal neuropathy °· Sudden, severe pain in the hip, thigh, or buttocks. Pain may spread from the back into the legs (sciatica). °· Pain and numbness in the arms and legs. °· Tingling. °· Loss of bladder control or bowel control. °· Weakness and wasting of thigh muscles. °· Difficulty getting up from a seated position. °· Abdominal swelling. °· Unexplained weight loss. °How is this diagnosed? °Diagnosis usually involves reviewing your medical history and any symptoms you have. Diagnosis varies depending on the type of neuropathy your health care provider suspects. °Peripheral neuropathy °Your health care provider will check areas that are affected by your nervous system (neurologic exam), such as your reflexes, how you move, and what you can feel. You may have other tests, such as: °· Blood tests. °· Removal and examination of fluid that surrounds the spinal cord (lumbar puncture). °· CT scan. °· MRI. °· A test to check the nerves that control muscles (electromyogram, EMG). °· Tests of how quickly messages pass through your nerves (nerve conduction velocity tests). °· Removal of a small piece of nerve to be examined under a microscope (biopsy). °Autonomic neuropathy °You may have tests, such as: °· Tests to measure your blood pressure and heart rate. This may include monitoring you while you are safely secured to an exam table that moves you from a lying position to an upright position (table tilt test). °· Breathing  tests to check your lungs. °· Tests to check how food moves through the digestive system (gastric emptying tests). °· Blood, sweat, or urine tests. °· Ultrasound of your bladder. °· Spinal fluid tests. °Focal neuropathy °This condition may be diagnosed with: °· A neurologic exam. °· CT scan. °· MRI. °· EMG. °· Nerve conduction velocity tests. °Proximal neuropathy °There is no test to diagnose this type of neuropathy. You may have tests to rule out other possible causes of this type of neuropathy. Tests may include: °· X-rays of your spine and lumbar region. °· Lumbar puncture. °· MRI. °How is this treated? °The   goal of treatment is to keep nerve damage from getting worse. The most important part of treatment is keeping your blood glucose level and your A1C level within your target range by following your diabetes management plan. Over time, maintaining lower blood glucose levels helps lessen symptoms. In some cases, you may need prescription pain medicine. °Follow these instructions at home: ° °Lifestyle ° °· Do not use any products that contain nicotine or tobacco, such as cigarettes and e-cigarettes. If you need help quitting, ask your health care provider. °· Be physically active every day. Include strength training and balance exercises. °· Follow a healthy meal plan. °· Work with your health care provider to manage your blood pressure. °General instructions °· Follow your diabetes management plan as directed. °? Check your blood glucose levels as directed by your health care provider. °? Keep your blood glucose in your target range as directed by your health care provider. °? Have your A1C level checked at least two times a year, or as often as told by your health care provider. °· Take over the counter and prescription medicines only as told by your health care provider. This includes insulin and diabetes medicine. °· Do not drive or use heavy machinery while taking prescription pain medicines. °· Check your  skin and feet every day for cuts, bruises, redness, blisters, or sores. °· Keep all follow up visits as told by your health care provider. This is important. °Contact a health care provider if: °· You have burning, stabbing, or aching pain in your legs or feet. °· You are unable to feel pressure or pain in your feet. °· You develop problems with digestion, such as: °? Nausea. °? Vomiting. °? Bloating. °? Constipation. °? Diarrhea. °? Abdominal pain. °· You have difficulty with urination, such as inability: °? To control when you urinate (incontinence). °? To completely empty the bladder (retention). °· You have palpitations. °· You feel dizzy, weak, or faint when you stand up. °Get help right away if: °· You cannot urinate. °· You have sudden weakness or loss of coordination. °· You have trouble speaking. °· You have pain or pressure in your chest. °· You have an irregular heart beat. °· You have sudden inability to move a part of your body. °Summary °· Diabetic neuropathy refers to nerve damage that is caused by diabetes. It can affect nerves throughout the entire body, causing numbness and pain in the arms, legs, digestive tract, heart, and other body systems. °· Keep your blood glucose level and your blood pressure in your target range, as directed by your health care provider. This can help prevent neuropathy from getting worse. °· Check your skin and feet every day for cuts, bruises, redness, blisters, or sores. °· Do not use any products that contain nicotine or tobacco, such as cigarettes and e-cigarettes. If you need help quitting, ask your health care provider. °This information is not intended to replace advice given to you by your health care provider. Make sure you discuss any questions you have with your health care provider. °Document Released: 01/25/2002 Document Revised: 12/29/2017 Document Reviewed: 12/21/2016 °Elsevier Interactive Patient Education © 2019 [employer]. ° °

## 2019-04-26 NOTE — Addendum Note (Signed)
Addended by: [name] on: 04/26/2019 05:10 PM   Modules accepted: Level of Service

## 2019-04-27 ENCOUNTER — Telehealth: Payer: Self-pay | Admitting: Hematology

## 2019-04-27 NOTE — Telephone Encounter (Signed)
Scheduled appt per 5/27 los. A calendar will be mailed out.

## 2019-04-30 ENCOUNTER — Encounter: Payer: Self-pay | Admitting: Podiatry

## 2019-04-30 NOTE — Progress Notes (Signed)
Subjective: Lindsey Marquez presents to clinic for preventative diabetic foot care. She presents with cc of painful mycotic toenails and painful calluses.  This pain limits her daily activities. Pain symptoms resolve with periodic professional debridement.  [name], NP is her PCP.  Last visit December 20, 2018.  She states her blood sugar was 250 mg/dL this morning.   Current Outpatient Medications:  .  aspirin 81 MG tablet, Take 81 mg by mouth daily as needed (leg pain). , Disp: , Rfl:  .  atorvastatin (LIPITOR) 10 MG tablet, Take 0.5 tablets (5 mg total) by mouth daily., Disp: 45 tablet, Rfl: 0 .  BD PEN NEEDLE NANO U/F 32G X 4 MM MISC, USE 1 PEN NEEDLE 2 (TWO) TIMES DAILY., Disp: 200 each, Rfl: 1 .  dapagliflozin propanediol (FARXIGA) 10 MG TABS tablet, Take 10 mg by mouth daily., Disp: 30 tablet, Rfl: 11 .  glucose blood (TRUE METRIX BLOOD GLUCOSE TEST) test strip, 1 each by Other route 3 (three) times daily. Use as instructed, Disp: 600 each, Rfl: 2 .  ibuprofen (ADVIL,MOTRIN) 200 MG tablet, Take 400 mg by mouth every 8 (eight) hours as needed for headache or moderate pain. , Disp: , Rfl:  .  Insulin Glargine (BASAGLAR KWIKPEN) 100 UNIT/ML SOPN, Inject 0.14 mLs (14 Units total) into the skin at bedtime., Disp: 15 mL, Rfl: 11 .  lisinopril-hydrochlorothiazide (PRINZIDE,ZESTORETIC) 20-25 MG tablet, Take 1 tablet by mouth daily., Disp: 90 tablet, Rfl: 2 .  metFORMIN (GLUCOPHAGE) 1000 MG tablet, Take 1 tablet (1,000 mg total) by mouth 2 (two) times daily with a meal., Disp: 180 tablet, Rfl: 3 .  NON FORMULARY, Peripheral Neuropathy cream from [place], Disp: , Rfl:  .  Omega-3 Fatty Acids (FISH OIL) 1200 MG CAPS, Take 1,200 mg by mouth daily. , Disp: , Rfl:  .  ondansetron (ZOFRAN-ODT) 4 MG disintegrating tablet, Take 1 tablet (4 mg total) by mouth every 8 (eight) hours as needed for nausea or vomiting., Disp: 20 tablet, Rfl: 1 .  Potassium Chloride ER 20 MEQ TBCR, Take 40  mEq daily by mouth., Disp: 60 tablet, Rfl: 2 .  sitaGLIPtin (JANUVIA) 100 MG tablet, Take 1 tablet (100 mg total) by mouth daily., Disp: 90 tablet, Rfl: 3 .  torsemide (DEMADEX) 20 MG tablet, Take 20 mg by mouth 2 (two) times daily as needed (swelling). , Disp: , Rfl:  .  triamcinolone cream (KENALOG) 0.1 %, APPLY 1 APPLICATION TOPICALLY 2 (TWO) TIMES DAILY TO AFFECTED AREAS, Disp: 80 g, Rfl: 0 .  TRUEPLUS LANCETS 33G MISC, 1 each by Does not apply route 3 (three) times daily., Disp: 600 each, Rfl: 2 .  gabapentin (NEURONTIN) 100 MG capsule, Take 1 capsule (100 mg total) by mouth at bedtime., Disp: 90 capsule, Rfl: 2 .  letrozole (FEMARA) 2.5 MG tablet, Take 1 tablet (2.5 mg total) by mouth daily., Disp: 90 tablet, Rfl: 3   Allergies  Allergen Reactions  . Codeine Other (See Comments)    "get crazy", "see things that aren't there"     Objective: Vitals:   04/26/19 1011  Temp: (!) 97.2 F (36.2 C)    Physical Examination:  Vascular  Examination: Capillary refill time immediate x 10 digits.  Palpable DP/PT pulses b/l.  Digital hair absent bilaterally.  No edema noted b/l.  Skin temperature gradient WNL b/l.  Dermatological Examination: Skin with normal turgor, texture and tone b/l.  No open wounds b/l.  No interdigital macerations noted b/l.  Elongated,  thick, discolored brittle toenails with subungual debris and pain on dorsal palpation of nailbeds 1-5 b/l.  Porokeratotic lesions lesions submetatarsal head 4 right foot, and submetatarsal head 2 left foot   There is no erythema, no edema, no drainage, no flocculence noted.  Musculoskeletal Examination: Muscle strength 5/5 to all muscle groups b/l.  Hammertoes 2 through 5 bilaterally.  Hallux varus left foot   Neurological Examination: Sensation intact 5/5 b/l with 10 gram monofilament.  Vibratory sensation intact b/l.  Proprioceptive sensation intact b/l.  Assessment: 1. Mycotic nail infection with pain 1-5  b/l 2. 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 NIDDM with subjective diabetic neuropathy  Plan: 1. Continue diabetic foot care principles daily.  Toenails 1-5 b/l were debrided in length and girth without iatrogenic laceration. 2. Porokeratosis submetatarsal head 4 right foot and submetatarsal head 2 left foot pared and enucleated with sterile scalpel blade without incident. Continue soft, supportive shoe gear daily. Report any pedal injuries to medical professional. Follow up 3 months. Patient/POA to call should there be a question/concern in there interim.

## 2019-05-11 DIAGNOSIS — E119 Type 2 diabetes mellitus without complications: Secondary | ICD-10-CM | POA: Diagnosis not present

## 2019-05-11 DIAGNOSIS — H40003 Preglaucoma, unspecified, bilateral: Secondary | ICD-10-CM | POA: Diagnosis not present

## 2019-05-15 ENCOUNTER — Ambulatory Visit: Payer: Medicare HMO

## 2019-05-22 ENCOUNTER — Other Ambulatory Visit: Payer: Self-pay

## 2019-05-22 ENCOUNTER — Ambulatory Visit (INDEPENDENT_AMBULATORY_CARE_PROVIDER_SITE_OTHER): Payer: Medicare HMO | Admitting: Orthotics

## 2019-05-22 DIAGNOSIS — Q828 Other specified congenital malformations of skin: Secondary | ICD-10-CM

## 2019-05-22 DIAGNOSIS — E1165 Type 2 diabetes mellitus with hyperglycemia: Secondary | ICD-10-CM | POA: Diagnosis not present

## 2019-05-22 DIAGNOSIS — E1142 Type 2 diabetes mellitus with diabetic polyneuropathy: Secondary | ICD-10-CM

## 2019-05-22 DIAGNOSIS — Z794 Long term (current) use of insulin: Secondary | ICD-10-CM

## 2019-05-22 DIAGNOSIS — L84 Corns and callosities: Secondary | ICD-10-CM | POA: Diagnosis not present

## 2019-05-22 NOTE — Progress Notes (Signed)

## 2019-06-30 ENCOUNTER — Ambulatory Visit
Admission: RE | Admit: 2019-06-30 | Discharge: 2019-06-30 | Disposition: A | Discharge status: Home / Self Care | Payer: Medicare HMO | Source: Ambulatory Visit | Attending: Internal Medicine | Admitting: Internal Medicine

## 2019-06-30 ENCOUNTER — Ambulatory Visit
Admission: RE | Admit: 2019-06-30 | Discharge: 2019-06-30 | Disposition: A | Discharge status: Home / Self Care | Payer: Medicare HMO | Source: Ambulatory Visit | Attending: Hematology | Admitting: Hematology

## 2019-06-30 ENCOUNTER — Other Ambulatory Visit: Payer: Self-pay

## 2019-06-30 DIAGNOSIS — E2839 Other primary ovarian failure: Secondary | ICD-10-CM

## 2019-06-30 DIAGNOSIS — Z13820 Encounter for screening for osteoporosis: Secondary | ICD-10-CM | POA: Diagnosis not present

## 2019-06-30 DIAGNOSIS — Z1231 Encounter for screening mammogram for malignant neoplasm of breast: Secondary | ICD-10-CM | POA: Diagnosis not present

## 2019-07-04 ENCOUNTER — Telehealth: Payer: Self-pay | Admitting: *Deleted

## 2019-07-04 NOTE — Telephone Encounter (Signed)
-----   Message from [name], MD sent at 07/04/2019 10:04 AM EDT ----- Let pt know her DEXA was normal, no concerns, thanks   [name]  07/04/2019

## 2019-07-04 NOTE — Telephone Encounter (Signed)
TCT patient regarding bone density results. Results are normal. Dr. Burr Medico has no concerns at this time.  No answer but was able to leave message on identified phone voice mail.

## 2019-07-05 ENCOUNTER — Telehealth: Payer: Self-pay

## 2019-07-05 NOTE — Telephone Encounter (Signed)
Faxed signed order back to [facility] [employer], sent to HIM for scanning to chart.

## 2019-07-15 DIAGNOSIS — C50912 Malignant neoplasm of unspecified site of left female breast: Secondary | ICD-10-CM | POA: Diagnosis not present

## 2019-07-25 ENCOUNTER — Telehealth: Payer: Self-pay | Admitting: Internal Medicine

## 2019-07-25 NOTE — Telephone Encounter (Signed)
Patient is requesting to have her Pneumonia shot done. Is it okay to schedule the patient for this?

## 2019-07-25 NOTE — Telephone Encounter (Signed)
[place] Night - Client Nonclinical Telephone Record AccessNurse Client [place] Night - Client Client Site [place] Physician Webb Silversmith - NP Contact Type Call Who Is Calling Patient / Member / Family / Caregiver Caller Name Levering Phone Number [phone] Patient Name Lindsey Marquez Patient DOB Nov 22, 1951 Call Type Message Only Information Provided Reason for Call Request to Schedule Office Appointment Initial Comment Caller is calling about her flu shot. She got a message about her appointment from the office. Additional Comment Office hours given. Caller declined to speak to a nurse. Call Closed By: Vito Backers Transaction Date/Time: 07/24/2019 5:15:21 PM (ET)

## 2019-08-01 ENCOUNTER — Other Ambulatory Visit: Payer: Self-pay

## 2019-08-01 ENCOUNTER — Encounter: Payer: Self-pay | Admitting: Podiatry

## 2019-08-01 ENCOUNTER — Ambulatory Visit (INDEPENDENT_AMBULATORY_CARE_PROVIDER_SITE_OTHER): Payer: Medicare HMO | Admitting: Podiatry

## 2019-08-01 DIAGNOSIS — Q828 Other specified congenital malformations of skin: Secondary | ICD-10-CM | POA: Diagnosis not present

## 2019-08-01 DIAGNOSIS — Z794 Long term (current) use of insulin: Secondary | ICD-10-CM

## 2019-08-01 DIAGNOSIS — M79674 Pain in right toe(s): Secondary | ICD-10-CM | POA: Diagnosis not present

## 2019-08-01 DIAGNOSIS — B351 Tinea unguium: Secondary | ICD-10-CM

## 2019-08-01 DIAGNOSIS — E1165 Type 2 diabetes mellitus with hyperglycemia: Secondary | ICD-10-CM

## 2019-08-01 DIAGNOSIS — M79675 Pain in left toe(s): Secondary | ICD-10-CM | POA: Diagnosis not present

## 2019-08-01 NOTE — Patient Instructions (Signed)

## 2019-08-03 ENCOUNTER — Other Ambulatory Visit: Payer: Self-pay

## 2019-08-03 ENCOUNTER — Ambulatory Visit (INDEPENDENT_AMBULATORY_CARE_PROVIDER_SITE_OTHER): Payer: Medicare HMO

## 2019-08-03 DIAGNOSIS — Z23 Encounter for immunization: Secondary | ICD-10-CM

## 2019-08-08 NOTE — Progress Notes (Signed)
Subjective: Lindsey Marquez is a 68 y.o. y.o. female who presents today for management of plantar porokeratoses and painful, discolored, thick toenails and painful callus/corn which interfere with daily activities. Pain is aggravated when wearing enclosed shoe gear and relieved with periodic professional debridement.  [name], NP is her PCP.    Current Outpatient Medications:  .  aspirin 81 MG tablet, Take 81 mg by mouth daily as needed (leg pain). , Disp: , Rfl:  .  atorvastatin (LIPITOR) 10 MG tablet, Take 0.5 tablets (5 mg total) by mouth daily., Disp: 45 tablet, Rfl: 0 .  BD PEN NEEDLE NANO U/F 32G X 4 MM MISC, USE 1 PEN NEEDLE 2 (TWO) TIMES DAILY., Disp: 200 each, Rfl: 1 .  dapagliflozin propanediol (FARXIGA) 10 MG TABS tablet, Take 10 mg by mouth daily., Disp: 30 tablet, Rfl: 11 .  gabapentin (NEURONTIN) 100 MG capsule, Take 1 capsule (100 mg total) by mouth at bedtime., Disp: 90 capsule, Rfl: 2 .  glucose blood (TRUE METRIX BLOOD GLUCOSE TEST) test strip, 1 each by Other route 3 (three) times daily. Use as instructed, Disp: 600 each, Rfl: 2 .  ibuprofen (ADVIL,MOTRIN) 200 MG tablet, Take 400 mg by mouth every 8 (eight) hours as needed for headache or moderate pain. , Disp: , Rfl:  .  Insulin Glargine (BASAGLAR KWIKPEN) 100 UNIT/ML SOPN, Inject 0.14 mLs (14 Units total) into the skin at bedtime., Disp: 15 mL, Rfl: 11 .  letrozole (FEMARA) 2.5 MG tablet, Take 1 tablet (2.5 mg total) by mouth daily., Disp: 90 tablet, Rfl: 3 .  lisinopril-hydrochlorothiazide (PRINZIDE,ZESTORETIC) 20-25 MG tablet, Take 1 tablet by mouth daily., Disp: 90 tablet, Rfl: 2 .  metFORMIN (GLUCOPHAGE) 1000 MG tablet, Take 1 tablet (1,000 mg total) by mouth 2 (two) times daily with a meal., Disp: 180 tablet, Rfl: 3 .  NON FORMULARY, Peripheral Neuropathy cream from [place], Disp: , Rfl:  .  Omega-3 Fatty Acids (FISH OIL) 1200 MG CAPS, Take 1,200 mg by mouth daily. , Disp: , Rfl:  .  ondansetron  (ZOFRAN-ODT) 4 MG disintegrating tablet, Take 1 tablet (4 mg total) by mouth every 8 (eight) hours as needed for nausea or vomiting., Disp: 20 tablet, Rfl: 1 .  Potassium Chloride ER 20 MEQ TBCR, Take 40 mEq daily by mouth., Disp: 60 tablet, Rfl: 2 .  sitaGLIPtin (JANUVIA) 100 MG tablet, Take 1 tablet (100 mg total) by mouth daily., Disp: 90 tablet, Rfl: 3 .  torsemide (DEMADEX) 20 MG tablet, Take 20 mg by mouth 2 (two) times daily as needed (swelling). , Disp: , Rfl:  .  triamcinolone cream (KENALOG) 0.1 %, APPLY 1 APPLICATION TOPICALLY 2 (TWO) TIMES DAILY TO AFFECTED AREAS, Disp: 80 g, Rfl: 0 .  TRUEPLUS LANCETS 33G MISC, 1 each by Does not apply route 3 (three) times daily., Disp: 600 each, Rfl: 2  Allergies  Allergen Reactions  . Codeine Other (See Comments)    "get crazy", "see things that aren't there"    Objective: Vascular Examination: Capillary refill time immediate x 10 digits.  Dorsalis pedis pulses palpable b/l.  Posterior tibial pulses palpable b/l.  Digital hair absent x 10 digits.  Skin temperature gradient WNL b/l.  Dermatological Examination: Skin with normal turgor, texture and tone b/l.  Toenails 1-5 b/l discolored, thick, dystrophic with subungual debris and pain with palpation to nailbeds due to thickness of nails.  Porokeratotic lesions submet head 4 right foot, submet head 2 left foot with tenderness to palpation.  No erythema, no edema, no drainage, no flocculence.   Musculoskeletal: Muscle strength 5/5 b/l to all LE muscle groups.  Hammertoes 2-5 b/l.  Hallux varus left foot.   Neurological: Sensation intact 5/5 b/l with 10 gram monofilament.  Vibratory sensation intact b/l.  Assessment: 1. Painful onychomycosis toenails 1-5 b/l 2.   Porokeratoses submet head 4 right foot, submet head 2 left foot 3.   NIDDM  Plan: 1. Continue diabetic foot care principles. Literature dispensed on today. 2. Toenails 1-5 b/l were debrided in length and girth  without iatrogenic bleeding. 3. Porokeratosis submet head 4 right foot, submet head 2 left foot pared and enucleated with sterile scalpel blade without incident.  4. Patient to continue soft, supportive shoe gear daily. 5. Patient to report any pedal injuries to medical professional immediately. 6. Follow up 3 months.  7. Patient/POA to call should there be a concern in the interim.

## 2019-08-11 ENCOUNTER — Other Ambulatory Visit: Payer: Self-pay

## 2019-08-11 ENCOUNTER — Ambulatory Visit: Payer: Medicare HMO | Admitting: Internal Medicine

## 2019-08-11 DIAGNOSIS — C50112 Malignant neoplasm of central portion of left female breast: Secondary | ICD-10-CM

## 2019-08-11 MED ORDER — GABAPENTIN 100 MG PO CAPS: 100.0000 mg | ORAL_CAPSULE | Freq: Every day | ORAL | 2 refills | Status: DC

## 2019-08-28 ENCOUNTER — Telehealth: Payer: Self-pay | Admitting: Nurse Practitioner

## 2019-08-28 ENCOUNTER — Inpatient Hospital Stay: Payer: Medicare HMO | Attending: Hematology

## 2019-08-28 ENCOUNTER — Encounter: Payer: Self-pay | Admitting: Nurse Practitioner

## 2019-08-28 ENCOUNTER — Other Ambulatory Visit: Payer: Self-pay

## 2019-08-28 ENCOUNTER — Inpatient Hospital Stay (HOSPITAL_BASED_OUTPATIENT_CLINIC_OR_DEPARTMENT_OTHER): Payer: Medicare HMO | Admitting: Nurse Practitioner

## 2019-08-28 DIAGNOSIS — Z800 Family history of malignant neoplasm of digestive organs: Secondary | ICD-10-CM | POA: Insufficient documentation

## 2019-08-28 DIAGNOSIS — E119 Type 2 diabetes mellitus without complications: Secondary | ICD-10-CM | POA: Diagnosis not present

## 2019-08-28 DIAGNOSIS — Z7982 Long term (current) use of aspirin: Secondary | ICD-10-CM | POA: Insufficient documentation

## 2019-08-28 DIAGNOSIS — I10 Essential (primary) hypertension: Secondary | ICD-10-CM | POA: Diagnosis not present

## 2019-08-28 DIAGNOSIS — Z170 Estrogen receptor positive status [ER+]: Secondary | ICD-10-CM | POA: Insufficient documentation

## 2019-08-28 DIAGNOSIS — E669 Obesity, unspecified: Secondary | ICD-10-CM | POA: Diagnosis not present

## 2019-08-28 DIAGNOSIS — Z923 Personal history of irradiation: Secondary | ICD-10-CM | POA: Insufficient documentation

## 2019-08-28 DIAGNOSIS — Z79899 Other long term (current) drug therapy: Secondary | ICD-10-CM | POA: Diagnosis not present

## 2019-08-28 DIAGNOSIS — Z79811 Long term (current) use of aromatase inhibitors: Secondary | ICD-10-CM | POA: Insufficient documentation

## 2019-08-28 DIAGNOSIS — Z9012 Acquired absence of left breast and nipple: Secondary | ICD-10-CM | POA: Diagnosis not present

## 2019-08-28 DIAGNOSIS — C50112 Malignant neoplasm of central portion of left female breast: Secondary | ICD-10-CM

## 2019-08-28 DIAGNOSIS — Z9221 Personal history of antineoplastic chemotherapy: Secondary | ICD-10-CM | POA: Diagnosis not present

## 2019-08-28 DIAGNOSIS — Z8041 Family history of malignant neoplasm of ovary: Secondary | ICD-10-CM | POA: Diagnosis not present

## 2019-08-28 DIAGNOSIS — Z794 Long term (current) use of insulin: Secondary | ICD-10-CM | POA: Diagnosis not present

## 2019-08-28 LAB — COMPREHENSIVE METABOLIC PANEL
ALT: 35 U/L (ref 0–44)
AST: 31 U/L (ref 15–41)
Albumin: 4.2 g/dL (ref 3.5–5.0)
Alkaline Phosphatase: 86 U/L (ref 38–126)
Anion gap: 9 (ref 5–15)
BUN: 19 mg/dL (ref 8–23)
CO2: 26 mmol/L (ref 22–32)
Calcium: 9.5 mg/dL (ref 8.9–10.3)
Chloride: 103 mmol/L (ref 98–111)
Creatinine, Ser: 0.92 mg/dL (ref 0.44–1.00)
GFR calc Af Amer: >60 mL/min (ref >60)
GFR calc non Af Amer: >60 mL/min (ref >60)
Glucose, Bld: 241 mg/dL — ABNORMAL HIGH (ref 70–99)
Potassium: 4.5 mmol/L (ref 3.5–5.1)
Sodium: 138 mmol/L (ref 135–145)
Total Bilirubin: 0.6 mg/dL (ref 0.3–1.2)
Total Protein: 8 g/dL (ref 6.5–8.1)

## 2019-08-28 LAB — CBC WITH DIFFERENTIAL (CANCER CENTER ONLY)
Abs Immature Granulocytes: 0.02 10*3/uL (ref 0.00–0.07)
Basophils Absolute: 0 10*3/uL (ref 0.0–0.1)
Basophils Relative: 0 %
Eosinophils Absolute: 0.2 10*3/uL (ref 0.0–0.5)
Eosinophils Relative: 2 %
HCT: 37.2 % (ref 36.0–46.0)
Hemoglobin: 12.2 g/dL (ref 12.0–15.0)
Immature Granulocytes: 0 %
Lymphocytes Relative: 33 %
Lymphs Abs: 2.4 10*3/uL (ref 0.7–4.0)
MCH: 33.1 pg (ref 26.0–34.0)
MCHC: 32.8 g/dL (ref 30.0–36.0)
MCV: 100.8 fL — ABNORMAL HIGH (ref 80.0–100.0)
Monocytes Absolute: 0.6 10*3/uL (ref 0.1–1.0)
Monocytes Relative: 7 %
Neutro Abs: 4.2 10*3/uL (ref 1.7–7.7)
Neutrophils Relative %: 58 %
Platelet Count: 298 10*3/uL (ref 150–400)
RBC: 3.69 MIL/uL — ABNORMAL LOW (ref 3.87–5.11)
RDW: 12.4 % (ref 11.5–15.5)
WBC Count: 7.4 10*3/uL (ref 4.0–10.5)
nRBC: 0 % (ref 0.0–0.2)

## 2019-08-28 NOTE — Progress Notes (Signed)
[place]   Telephone:([phone]) [phone] [phone]) [phone]   Clinic Follow up Note   Patient Care Team: [name], NP as PCP - General (Internal Medicine) [name], MD as Consulting Physician (General Surgery) [name], MD as Consulting Physician (Hematology) Gardenia Phlegm, NP as Nurse Practitioner (Hematology and Oncology) 08/28/2019  CHIEF COMPLAINT: Follow-up recurrent left breast cancer  SUMMARY OF ONCOLOGIC HISTORY: Oncology History Overview Note  Cancer of central portion of left breast [facility])   Staging form: Breast, AJCC 7th Edition     Clinical stage from 04/17/2016: Stage IA (T1c, N0, M0) - Signed by [name], MD on 05/07/2016     Pathologic stage from 05/28/2016: Stage IIA (T2, N0, cM0) - Signed by [name], MD on 06/11/2016 History of left breast cancer   Staging form: Breast, AJCC 7th Edition     Clinical: Stage IIIB (T4d, N1, cM0) - Signed by [name], MD on 12/14/2013     Pathologic: Stage IIIB (T4d, N1a, cM0) - Signed by [name], MD on 12/14/2013     History of left breast cancer (Resolved)  09/12/2002 Imaging   CT scan show no evidence of disease apart from the left axilla   09/2002 -  Neo-Adjuvant Chemotherapy   TAC X4 cycles    01/10/2003 Surgery   The patient had left lumpectomy and axillary lymph node dissection which show residual breast cancer and a sentinel lymph node was positive   2004 -  Adjuvant Chemotherapy   Cytoxan with 5-FU x4 cycles (methotrexate added at cycle 4).   2004 -  Radiation Therapy   Adjuvant left breast radiation   12/2002 Receptors her2   ER, PR and HER-2 were all negative.   Cancer of central portion of left breast ([place])  04/13/2016 Mammogram   Scheduler coarse heterogeneous calcifications spanning an area of 3.7 cm in the lumpectomy site, indeterminate. Ultrasound of the left axilla was negative.   04/17/2016 Initial Diagnosis   Cancer of central portion of left breast ([place])   04/17/2016  Initial Biopsy   Left breast core needle biopsy showed invasive and in situ ductal carcinoma with calcification, grade 2.   04/17/2016 Receptors her2   ER 100% positive, PR 15% positive, strong staining, Ki-67 10%, HER-2 positive by IHC (3)   05/05/2016 Imaging   Bilateral breast MRI showed a 1.3 x 0.8 x 0.7 cm biopsy-proven ductal carcinoma in the region of the previous lumpectomy. Enlarged lobulated right inferior axillary lymph node with diffuse cortical thickening, biopsy is recommended.   05/15/2016 Pathology Results   right axilary node biopsy was negative for malignant cell    05/28/2016 Surgery   Simple left mastectomy   05/28/2016 Pathology Results   Left mastectomy showed invasive ductal carcinoma, grade 3, 3.6 cm, high grade DCIS, (+) LVI, surgical margins were negative. Tumor 3.6 cm, no lymph nodes identified.   06/25/2016 Imaging   Staging CT scan of the chest, abdomen and pelvis with contrast and a bone scan showed no evidence of metastasis. Postoperative fluid collection in the left breast, likely a seroma or hematoma. Right axillary adenopathy, which was biopsied previously.    07/07/2016 - 07/15/2017 Chemotherapy   Adjuvant chemotherapy with docetaxel, carboplatin, Herceptin and pejeta every 3 weeks, for 6 cycles, followed by Herceptin maintenance therapy for total of one year treatment  Ended [facility] 11/04/16     12/15/2016 -  Anti-estrogen oral therapy   Adjuvant letrozole 2.5 mg once daily   04/29/2017 Mammogram  Mammogram 04/29/2017 IMPRESSION: No mammographic evidence of malignancy. A result letter of this screening mammogram will be mailed directly to the patient.    08/26/2017 - 07/2018 Antibody Plan   She started Nerlynx 262m on 08/26/17, was held on 09/14/17 due to significant diarrhea and restarted when her diarrhea resolved on 09/21/17. Completed in 07/2018   09/22/2017 Procedure   Colonoscopy by Dr. NSilverio Decamp IMPRESSION: - One 3 mm polyp in the transverse colon,  removed with a cold biopsy forceps. Resected and retrieved. - Non-bleeding internal hemorrhoids.   09/22/2017 Pathology Results   Diagnosis, colonoscopy  Surgical [P], transverse, polyp - TUBULAR ADENOMA(S). - HIGH GRADE DYSPLASIA IS NOT IDENTIFIED.     CURRENT THERAPY: Letrozole 2.5 mg daily, started on 12/15/2016  INTERVAL HISTORY: Ms. CBuckerreturns for follow-up as scheduled.  She had a virtual visit on 04/26/2019.  She had a screening right mammogram on 06/30/2019 that was negative and bone density scan that was normal. Continues letrozole. She feels well. Neuropathy in feet and legs is worse at night, improved with gabapentin. Not affecting ambulation. No fall. None in hands. Denies joint/bone or new abdominal pain, hot flashes, breast changes such as new lump or nipple discharge. Appetite and weight are normal for her. Denies fatigue, fever, chills, cough, chest pain, dyspnea, or leg edema. Denies GI or GYN bleeding.    MEDICAL HISTORY:  Past Medical History:  Diagnosis Date  . Blood transfusion without reported diagnosis   . Breast cancer ([facility] August 2002   Invasive ductal carcinoma Left breast. 2002, 2017  . Diabetes mellitus without complication ([place]   . Family history of malignant neoplasm of ovary   . Family history of pancreatic cancer   . Hypertension     SURGICAL HISTORY: Past Surgical History:  Procedure Laterality Date  . BREAST SURGERY Left 2003  . COLONOSCOPY    . FOOT SURGERY  2000  . INSERTION OF MESH N/A 02/17/2018   Procedure: INSERTION OF MESH;  Surgeon: TDonnie Mesa MD;  [place]  Service: General;  Laterality: N/A;  . MASTECTOMY Left 05/28/2016  . port a cath insertion  05/28/2016  . PORT-A-CATH REMOVAL Right 08/19/2017   Procedure: REMOVAL PORT-A-CATH;  Surgeon: TDonnie Mesa MD;  [place] [facility] [place]  Service: General;  Laterality: Right;  . PORTACATH PLACEMENT Right 05/28/2016   Procedure: INSERTION PORT-A-CATH;  Surgeon:  MDonnie Mesa MD;  [place]  Service: General;  Laterality: Right;  . TOTAL MASTECTOMY Left 05/28/2016   Procedure: LEFT  MASTECTOMY;  Surgeon: MDonnie Mesa MD;  [place]  Service: General;  Laterality: Left;  . TUBAL LIGATION  22 yrs since  2004.   Bilateral.  . VENTRAL HERNIA REPAIR  02/17/2018   open  . VENTRAL HERNIA REPAIR N/A 02/17/2018   Procedure: OPEN VENTRAL HERNIA REPAIR WITH MESH;  Surgeon: TDonnie Mesa MD;  [place] [facility] [place]  Service: General;  Laterality: N/A;    I have reviewed the social history and family history with the patient and they are unchanged from previous note.  ALLERGIES:  is allergic to codeine.  MEDICATIONS:  Current Outpatient Medications  Medication Sig Dispense Refill  . aspirin 81 MG tablet Take 81 mg by mouth daily as needed (leg pain).     .Marland Kitchenatorvastatin (LIPITOR) 10 MG tablet Take 0.5 tablets (5 mg total) by mouth daily. 45 tablet 0  . BD PEN NEEDLE NANO U/F 32G X 4 MM MISC USE 1 PEN NEEDLE 2 (TWO) TIMES  DAILY. 200 each 1  . dapagliflozin propanediol (FARXIGA) 10 MG TABS tablet Take 10 mg by mouth daily. 30 tablet 11  . gabapentin (NEURONTIN) 100 MG capsule Take 1 capsule (100 mg total) by mouth at bedtime. 90 capsule 2  . glucose blood (TRUE METRIX BLOOD GLUCOSE TEST) test strip 1 each by Other route 3 (three) times daily. Use as instructed 600 each 2  . ibuprofen (ADVIL,MOTRIN) 200 MG tablet Take 400 mg by mouth every 8 (eight) hours as needed for headache or moderate pain.     . Insulin Glargine (BASAGLAR KWIKPEN) 100 UNIT/ML SOPN Inject 0.14 mLs (14 Units total) into the skin at bedtime. 15 mL 11  . letrozole (FEMARA) 2.5 MG tablet Take 1 tablet (2.5 mg total) by mouth daily. 90 tablet 3  . lisinopril-hydrochlorothiazide (PRINZIDE,ZESTORETIC) 20-25 MG tablet Take 1 tablet by mouth daily. 90 tablet 2  . metFORMIN (GLUCOPHAGE) 1000 MG tablet Take 1 tablet (1,000 mg total) by mouth 2 (two) times daily with a meal. 180 tablet 3  .  NON FORMULARY Peripheral Neuropathy cream from [place]    . Omega-3 Fatty Acids (FISH OIL) 1200 MG CAPS Take 1,200 mg by mouth daily.     . sitaGLIPtin (JANUVIA) 100 MG tablet Take 1 tablet (100 mg total) by mouth daily. 90 tablet 3  . torsemide (DEMADEX) 20 MG tablet Take 20 mg by mouth 2 (two) times daily as needed (swelling).     . triamcinolone cream (KENALOG) 0.1 % APPLY 1 APPLICATION TOPICALLY 2 (TWO) TIMES DAILY TO AFFECTED AREAS 80 g 0  . TRUEPLUS LANCETS 33G MISC 1 each by Does not apply route 3 (three) times daily. 600 each 2  . ondansetron (ZOFRAN-ODT) 4 MG disintegrating tablet Take 1 tablet (4 mg total) by mouth every 8 (eight) hours as needed for nausea or vomiting. 20 tablet 1  . Potassium Chloride ER 20 MEQ TBCR Take 40 mEq daily by mouth. 60 tablet 2   No current facility-administered medications for this visit.     PHYSICAL EXAMINATION: ECOG PERFORMANCE STATUS: 1 - Symptomatic but completely ambulatory  Per intake sheet: BP 156/71, P 103, RR 18, T 98.7, O2 97% on RA, Wt 230.9 lbs  There were no vitals filed for this visit. There were no vitals filed for this visit.  GENERAL:alert, no distress and comfortable SKIN: no rash  EYES:  sclera clear LYMPH:  no palpable cervical or supraclavicular lymphadenopathy  LUNGS: clear with normal breathing effort HEART: regular rate & rhythm, no lower extremity edema ABDOMEN: abdomen round Musculoskeletal:no cyanosis of digits  NEURO: alert & oriented x 3 with fluent speech, normal gait Breast exam: s/p left mastectomy. No nodularity along the chest wall. No palpable mass in right breast or either axilla that I could appreciate   LABORATORY DATA:  I have reviewed the data as listed CBC Latest Ref Rng & Units 08/28/2019 04/05/2019 12/20/2018  WBC 4.0 - 10.5 K/uL 7.4 7.5 7.4  Hemoglobin 12.0 - 15.0 g/dL 12.2 12.1 12.5  Hematocrit 36.0 - 46.0 % 37.2 36.5 37.2  Platelets 150 - 400 K/uL 298 291 350.0     CMP Latest Ref  Rng & Units 08/28/2019 04/05/2019 03/29/2019  Glucose 70 - 99 mg/dL 241(H) 215(H) 215(H)  BUN 8 - 23 mg/dL 19 18 18   Creatinine 0.44 - 1.00 mg/dL 0.92 0.99 0.80  Sodium 135 - 145 mmol/L 138 139 136  Potassium 3.5 - 5.1 mmol/L 4.5 4.2 3.8  Chloride 98 - 111 mmol/L  103 104 100  CO2 22 - 32 mmol/L 26 24 26   Calcium 8.9 - 10.3 mg/dL 9.5 9.5 9.2  Total Protein 6.5 - 8.1 g/dL 8.0 8.1 7.8  Total Bilirubin 0.3 - 1.2 mg/dL 0.6 0.5 0.6  Alkaline Phos 38 - 126 U/L 86 73 65  AST 15 - 41 U/L 31 34 43(H)  ALT 0 - 44 U/L 35 39 43(H)      RADIOGRAPHIC STUDIES: I have personally reviewed the radiological images as listed and agreed with the findings in the report. No results found.   ASSESSMENT & PLAN: Lindsey Marquez is a 68 y.o. female with   1. 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 She was treated with Neo-adjuvant chemo, left breast lumpectomy, adjuvant chemo and Radiation.  -She had another left breast cancer in 03/2016. She was treated with left mastectomy, adjuvant chemo TCHP and Nerlynx.  -She started letrozole in 11/2016. Tolerating well.  -05/2019 mammogram was negative  -Ms. Bing is clinically doing well. Labs reviewed. Physical exam unremarkable. No clinical concern for recurrence. She will continue letrozole which she tolerates well overall. -continues surveillance  -lab and f/u in 6 months   2. Anemia  -Secondary to chemotherapy  -resolved  -Hgb 12.2 today  3. DM and HTN  -She'll continue follow-up with her primary care physician -BG 241 today, she reports it is in 160-180 range at home -BP 156/71 today  4. 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 She will continue to wear compression socks.  -she started gabapentin at night, takes 300 mg -improved  5. Obesity  -stable weight  6. Bone  Health -We previously discussed that Letrozole can cause some bone weakening  -Her Bone density scan from 04/29/17 her Femur Neck Right T-score is -0.8. This patient is considered normal. -DEXA 05/2019 is normal, continues calcium and vitamin D   PLAN: -Labs reviewed  -Continue breast cancer surveillance -Continue letrozole -Annual mammogram in 05/2020  -F/u in 6 months  -patient has received flu vaccine previously   All questions were answered. The patient knows to call the clinic with any problems, questions or concerns. No barriers to learning was detected.     Alla Feeling, NP 08/28/19

## 2019-08-28 NOTE — Telephone Encounter (Signed)
Scheduled appt per 9/28 los. ° °Spoke with pt and she is aware of the appt date and time. °

## 2019-08-30 ENCOUNTER — Other Ambulatory Visit: Payer: Self-pay

## 2019-09-01 ENCOUNTER — Ambulatory Visit: Payer: Medicare HMO | Admitting: Internal Medicine

## 2019-09-19 ENCOUNTER — Other Ambulatory Visit: Payer: Self-pay | Admitting: Internal Medicine

## 2019-10-04 ENCOUNTER — Ambulatory Visit (INDEPENDENT_AMBULATORY_CARE_PROVIDER_SITE_OTHER): Payer: Medicare HMO | Admitting: Internal Medicine

## 2019-10-04 ENCOUNTER — Other Ambulatory Visit: Payer: Self-pay

## 2019-10-04 ENCOUNTER — Encounter: Payer: Self-pay | Admitting: Internal Medicine

## 2019-10-04 VITALS — BP 148/80 | HR 101 | Ht 68.15 in | Wt 232.0 lb

## 2019-10-04 DIAGNOSIS — E1165 Type 2 diabetes mellitus with hyperglycemia: Secondary | ICD-10-CM | POA: Diagnosis not present

## 2019-10-04 DIAGNOSIS — Z794 Long term (current) use of insulin: Secondary | ICD-10-CM

## 2019-10-04 DIAGNOSIS — E785 Hyperlipidemia, unspecified: Secondary | ICD-10-CM | POA: Diagnosis not present

## 2019-10-04 DIAGNOSIS — E669 Obesity, unspecified: Secondary | ICD-10-CM | POA: Diagnosis not present

## 2019-10-04 LAB — POCT GLYCOSYLATED HEMOGLOBIN (HGB A1C): Hemoglobin A1C: 10.4 % — AB (ref 4.0–5.6)

## 2019-10-04 MED ORDER — BASAGLAR KWIKPEN 100 UNIT/ML ~~LOC~~ SOPN: 20.0000 [IU] | PEN_INJECTOR | Freq: Every day | SUBCUTANEOUS | 11 refills | Status: DC

## 2019-10-04 NOTE — Progress Notes (Signed)
Will Patient ID: Lindsey Marquez, female   DOB: January 01, 1951, 68 y.o.   MRN: [id number]   HPI: Lindsey Marquez is a 68 y.o.-year-old female, returning for follow-up for DM2, dx in ~2001, insulin-dependent, uncontrolled, without long-term complications. Last visit 9 months ago.  At last visit, her sugars were even higher, and she was missing many medication doses.  At this visit, she mentions that she is not missing as many doses.  Last hemoglobin A1c was: Lab Results  Component Value Date   HGBA1C 9.8 (H) 12/20/2018   HGBA1C 9.4 (A) 09/20/2018   HGBA1C 8.8 (H) 04/05/2018   Pt was on a regimen of: - Basaglar 14 units at bedtime - Metformin-glyburide 03-999 mg 2x a day with meal - Januvia 100 mg daily in a.m.  At last visit, she was on: - Basaglar 14 units at bedtime. - Metformin 1000 mg 2x a day with meals - Farxiga 10 mg daily before b'fast - Januvia 100 mg daily in a.m. -added back 08/2018   Pt checks her sugars 4-5x a day: - am: 89-140 >> 117-130 >> 190, 243, 318 >> 199-240 (after coffee) - 2h after b'fast: n/c - before lunch: n/c >> 329 >> 175-200 - 2h after lunch: n/c >> 344  - before dinner: n/c >> 176, 226 >> 180-195  - 2h after dinner: n/c >> 171-190 - bedtime:  100, 117, 160-170 (candy, icecream) >> 244, 266 >> 178-200 - nighttime: n/c Lowest sugar was 69 >> 100 >> 176 >> 175; she has hypoglycemia awareness in the 70s. Highest sugar was 240 >> 170 >> 344 >> 240.  Glucometer: True metrix (we cannot download this)  Pt's meals are: - Breakfast:coffee, mc muffin, egg - Lunch: [place] sandwich - Dinner: baked chicken, veggies,dessert - apples, icecream - Snacks: yoghurt, pretzels, PB  -No CKD, last BUN/creatinine:  Lab Results  Component Value Date   BUN 19 08/28/2019   BUN 18 04/05/2019   CREATININE 0.92 08/28/2019   CREATININE 0.99 04/05/2019  On lisinopril 20. -+ HL; last set of lipids: Lab Results  Component Value Date   CHOL 139 03/29/2019   HDL 43.00  03/29/2019   LDLCALC 66 03/29/2019   LDLDIRECT 95.0 10/04/2017   TRIG 154.0 (H) 03/29/2019   CHOLHDL 3 03/29/2019  On fish oil. - last eye exam was in 2020: No DR -No numbness and tingling in her feet.  On ASA 81.  Pt has FH of DM in brother, sister, father.  + h/o BrCA in 2013, recurrence in 2018.  She is now cancer free.  ROS: Constitutional: no weight gain/no weight loss, no fatigue, no subjective hyperthermia, no subjective hypothermia Eyes: no blurry vision, no xerophthalmia ENT: no sore throat, no nodules palpated in neck, no dysphagia, no odynophagia, no hoarseness Cardiovascular: no CP/no SOB/no palpitations/no leg swelling Respiratory: no cough/no SOB/no wheezing Gastrointestinal: no N/no V/no D/no C/no acid reflux Musculoskeletal: no muscle aches/+ joint aches Skin: no rashes, no hair loss Neurological: no tremors/no numbness/no tingling/no dizziness  I reviewed pt's medications, allergies, PMH, social hx, family hx, and changes were documented in the history of present illness. Otherwise, unchanged from my initial visit note.  Current Outpatient Medications on File Prior to Visit  Medication Sig  . aspirin 81 MG tablet Take 81 mg by mouth daily as needed (leg pain).   Marland Kitchen atorvastatin (LIPITOR) 10 MG tablet Take 0.5 tablets (5 mg total) by mouth daily.  . BD PEN NEEDLE NANO U/F 32G X 4 MM MISC  USE 1 PEN NEEDLE 2 (TWO) TIMES DAILY.  . dapagliflozin propanediol (FARXIGA) 10 MG TABS tablet Take 10 mg by mouth daily.  Marland Kitchen gabapentin (NEURONTIN) 100 MG capsule Take 1 capsule (100 mg total) by mouth at bedtime.  Marland Kitchen glucose blood (TRUE METRIX BLOOD GLUCOSE TEST) test strip 1 each by Other route 3 (three) times daily. Use as instructed  . ibuprofen (ADVIL,MOTRIN) 200 MG tablet Take 400 mg by mouth every 8 (eight) hours as needed for headache or moderate pain.   . Insulin Glargine (BASAGLAR KWIKPEN) 100 UNIT/ML SOPN Inject 0.14 mLs (14 Units total) into the skin at bedtime.  Marland Kitchen  letrozole (FEMARA) 2.5 MG tablet Take 1 tablet (2.5 mg total) by mouth daily.  Marland Kitchen lisinopril-hydrochlorothiazide (PRINZIDE,ZESTORETIC) 20-25 MG tablet Take 1 tablet by mouth daily.  . metFORMIN (GLUCOPHAGE) 1000 MG tablet Take 1 tablet (1,000 mg total) by mouth 2 (two) times daily with a meal.  . NON FORMULARY Peripheral Neuropathy cream from [place]  . Omega-3 Fatty Acids (FISH OIL) 1200 MG CAPS Take 1,200 mg by mouth daily.   . ondansetron (ZOFRAN-ODT) 4 MG disintegrating tablet Take 1 tablet (4 mg total) by mouth every 8 (eight) hours as needed for nausea or vomiting.  . Potassium Chloride ER 20 MEQ TBCR Take 40 mEq daily by mouth.  . sitaGLIPtin (JANUVIA) 100 MG tablet Take 1 tablet (100 mg total) by mouth daily.  Marland Kitchen torsemide (DEMADEX) 20 MG tablet Take 20 mg by mouth 2 (two) times daily as needed (swelling).   . triamcinolone cream (KENALOG) 0.1 % APPLY 1 APPLICATION TOPICALLY 2 (TWO) TIMES DAILY TO AFFECTED AREAS  . TRUEPLUS LANCETS 33G MISC 1 each by Does not apply route 3 (three) times daily.   No current facility-administered medications on file prior to visit.   Also, Glucovance 03-999 milligrams twice a day before meals  Past Medical History:  Diagnosis Date  . Blood transfusion without reported diagnosis   . Breast cancer [facility]) August 2002   Invasive ductal carcinoma Left breast. 2002, 2017  . Diabetes mellitus without complication ([place])   . Family history of malignant neoplasm of ovary   . Family history of pancreatic cancer   . Hypertension    Past Surgical History:  Procedure Laterality Date  . BREAST SURGERY Left 2003  . COLONOSCOPY    . FOOT SURGERY  2000  . INSERTION OF MESH N/A 02/17/2018   Procedure: INSERTION OF MESH;  Surgeon: [name], MD;  Location: [facility];  Service: General;  Laterality: N/A;  . MASTECTOMY Left 05/28/2016  . port a cath insertion  05/28/2016  . PORT-A-CATH REMOVAL Right 08/19/2017   Procedure: REMOVAL PORT-A-CATH;  Surgeon:  [name], MD;  Location: [facility];  Service: General;  Laterality: Right;  . PORTACATH PLACEMENT Right 05/28/2016   Procedure: INSERTION PORT-A-CATH;  Surgeon: [name], MD;  Location: [facility];  Service: General;  Laterality: Right;  . TOTAL MASTECTOMY Left 05/28/2016   Procedure: LEFT  MASTECTOMY;  Surgeon: [name], MD;  Location: [facility];  Service: General;  Laterality: Left;  . TUBAL LIGATION  22 yrs since  2004.   Bilateral.  . VENTRAL HERNIA REPAIR  02/17/2018   open  . VENTRAL HERNIA REPAIR N/A 02/17/2018   Procedure: OPEN VENTRAL HERNIA REPAIR WITH MESH;  Surgeon: [name], MD;  Location: [facility];  Service: General;  Laterality: N/A;   Social History   Socioeconomic History  . Marital status: Divorced    Spouse  name: Not on file  . Number of children: Not on file  . Years of education: Not on file  . Highest education level: Not on file  Occupational History  . Not on file  Social Needs  . Financial resource strain: Not on file  . Food insecurity    Worry: Not on file    Inability: Not on file  . Transportation needs    Medical: Not on file    Non-medical: Not on file  Tobacco Use  . Smoking status: Former Smoker    Packs/day: 0.10    Years: 7.00    Pack years: 0.70    Quit date: 12/01/1975    Years since quitting: 43.8  . Smokeless tobacco: Never Used  Substance and Sexual Activity  . Alcohol use: No    Alcohol/week: 0.0 standard drinks  . Drug use: No  . Sexual activity: Yes  Lifestyle  . Physical activity    Days per week: Not on file    Minutes per session: Not on file  . Stress: Not on file  Relationships  . Social Herbalist on phone: Not on file    Gets together: Not on file    Attends religious service: Not on file    Active member of club or organization: Not on file    Attends meetings of clubs or organizations: Not on file    Relationship status: Not on file  . Intimate partner violence    Fear of  current or ex partner: Not on file    Emotionally abused: Not on file    Physically abused: Not on file    Forced sexual activity: Not on file  Other Topics Concern  . Not on file  Social History Narrative  . Not on file    Allergies  Allergen Reactions  . Codeine Other (See Comments)    "get crazy", "see things that aren't there"   Family History  Problem Relation Age of Onset  . Prostate cancer Father   . Ovarian cancer Maternal Aunt 47       deceased  . Pancreatic cancer Mother 7       deceased  . Cancer Maternal Aunt        2 other mat aunts; unk. primary in 84s; deceased  . Cancer Maternal Uncle        "bone ca"; unk. primary; deceased 23s  . Prostate cancer Maternal Uncle        deceased 35  . Colon cancer Neg Hx   . Esophageal cancer Neg Hx   . Rectal cancer Neg Hx   . Stomach cancer Neg Hx     PE: BP (!) 148/80   Pulse (!) 101   Ht 5' 8.15" (1.731 m) Comment: measured without shoes  Wt 232 lb (105.2 kg)   SpO2 97%   BMI 35.12 kg/m  Wt Readings from Last 3 Encounters:  10/04/19 232 lb (105.2 kg)  01/03/19 229 lb (103.9 kg)  12/20/18 234 lb (106.1 kg)   Constitutional: overweight, in NAD Eyes: PERRLA, EOMI, no exophthalmos ENT: moist mucous membranes, no thyromegaly, no cervical lymphadenopathy Cardiovascular: tachycardia, RR, No MRG Respiratory: CTA B Gastrointestinal: abdomen soft, NT, ND, BS+ Musculoskeletal: no deformities, strength intact in all 4 Skin: moist, warm, no rashes Neurological: no tremor with outstretched hands, DTR normal in all 4  ASSESSMENT: 1. DM2, insulin-dependent, uncontrolled, without long-term complications, but with hyperglycemia  2. HL  3.  Obesity  PLAN:  1. Patient with longstanding, uncontrolled, type 2 diabetes, on oral antidiabetic regimen and long-acting insulin, returning after long absence.  Her sugars improved after addition of Basaglar but they were still above target so I advised her to add back Januvia.   At last visit sugars were higher as she was noncompliant with her diabetic regimen.  She was only taking medication as you remember it, not consistently.  Therefore, I cannot make changes in her regimen but I strongly advised her to start taking the medications as prescribed.  At that time, she was telling me that Nancee Liter was not affordable for her and I gave her a list of long-acting insulins to check with her insurance and see which one is covered.  She did not call me back with this information, however, at this visit, she tells me that Nancee Liter is not affordable.  However, Wilder Glade and generally will be quite expensive starting next year. -At this visit, we reviewed her sugars (she brings a good log), which are higher than before, with the highest levels in the morning.  We will increase her Basaglar to 20 units and I advised her to continue to increase the dose until sugars in the morning are better.  Since Januvia is expensive, we will go ahead and stop it but I would like her to continue [place] if possible.  She tells me that she will be able to avoid it if she only picks up 1 month at the time.  We will also continue metformin.  I think that increasing insulin will improve her sugars throughout the day so she may not need further intensification of treatment.  However, I advised her to get in touch with me in 4 weeks and if the sugars are not at goal, we may need to add a GLP-1 receptor agonist.  In the meantime, I advised her to check with her insurance whether these are covered. - I suggested to:  Patient Instructions  Please increase: - Basaglar 20 units at bedtime. Continue to increase by 2 units every 4 days until sugrs in am <130 or you get to 36 units  Please continue: - Metformin 1000 mg 2x a day with meals - Farxiga 10 mg daily before b'fast  Stop: - Januvia 100 mg before b'fast  Please check with your insurance if the following medicines are covered: Ozempic, Trulicity, Bydureon,  Victoza (once a day).  Let me know about the sugars in 4 weeks.  Please come back for a follow-up appointment in 3 months.  - we checked her HbA1c: 10.4% (higher) - advised to check sugars at different times of the day - 1-2x a day, rotating check times - advised for yearly eye exams >> she is not UTD - return to clinic in 3 months  2. HL - Reviewed latest lipid panel from 03/2019: LDL at goal, triglycerides slightly high, HDL at goal Lab Results  Component Value Date   CHOL 139 03/29/2019   HDL 43.00 03/29/2019   LDLCALC 66 03/29/2019   LDLDIRECT 95.0 10/04/2017   TRIG 154.0 (H) 03/29/2019   CHOLHDL 3 03/29/2019  - Continues fish oil without side effects.  3.  Obesity -We restarted Farxiga at last visit, which should also help with weight loss.  She continues this. -She did not lose weight since last visit -Unfortunately, we need to increase the insulin which may cause further weight gain. -In a month, we will see if we need to add a GLP-1 receptor agonist.  This will  help with weight loss, in case we end up adding it. -In the meantime, she needs to get a better handle of her diet, which she mentions is not the best  Philemon Kingdom, MD PhD [facility] Endocr[facility]

## 2019-10-04 NOTE — Patient Instructions (Signed)
Please increase: - Basaglar 20 units at bedtime. Continue to increase by 2 units every 4 days until sugrs in am <130 or you get to 36 units  Please continue: - Metformin 1000 mg 2x a day with meals - Farxiga 10 mg daily before b'fast  Stop: - Januvia 100 mg before b'fast  Please check with your insurance if the following medicines are covered: Ozempic, Trulicity, Bydureon, Victoza (once a day).  Let me know about the sugars in 4 weeks.  Please come back for a follow-up appointment in 3 months.

## 2019-10-23 DIAGNOSIS — Z03818 Encounter for observation for suspected exposure to other biological agents ruled out: Secondary | ICD-10-CM | POA: Diagnosis not present

## 2019-11-07 ENCOUNTER — Ambulatory Visit (INDEPENDENT_AMBULATORY_CARE_PROVIDER_SITE_OTHER): Payer: Medicare HMO | Admitting: Podiatry

## 2019-11-07 ENCOUNTER — Other Ambulatory Visit: Payer: Self-pay

## 2019-11-07 DIAGNOSIS — M79675 Pain in left toe(s): Secondary | ICD-10-CM | POA: Diagnosis not present

## 2019-11-07 DIAGNOSIS — Z794 Long term (current) use of insulin: Secondary | ICD-10-CM

## 2019-11-07 DIAGNOSIS — Q828 Other specified congenital malformations of skin: Secondary | ICD-10-CM

## 2019-11-07 DIAGNOSIS — M79674 Pain in right toe(s): Secondary | ICD-10-CM | POA: Diagnosis not present

## 2019-11-07 DIAGNOSIS — E1165 Type 2 diabetes mellitus with hyperglycemia: Secondary | ICD-10-CM

## 2019-11-07 DIAGNOSIS — B351 Tinea unguium: Secondary | ICD-10-CM | POA: Diagnosis not present

## 2019-11-12 ENCOUNTER — Encounter: Payer: Self-pay | Admitting: Podiatry

## 2019-11-12 NOTE — Progress Notes (Signed)
Subjective: Lindsey Marquez is a 68 y.o. y.o. female who presents today for preventative diabetic foot care with h/o calluses and painful, mycotic toenails.  She voices no new pedal problems on today's visit.   [name], NP is her PCP.   Medications reviewed in chart.  Allergies  Allergen Reactions  . Codeine Other (See Comments)    "get crazy", "see things that aren't there"   Objective: There were no vitals filed for this visit.  Vascular Examination: Capillary refill time to digits immediate b/l.  Dorsalis pedis and poosterior tibial pulses palpable b/l.  Digital hair absent b/l.  Skin temperature gradient WNL b/l.  Dermatological Examination: Skin with normal turgor, texture and tone b/l.  Porokeratotic lesions submet head 4 right foot, submet head 2 left foot with tenderness to palpation. No erythema, no edema, no drainage, no flocculence.  Toenails 1-5 b/l discolored, thick, dystrophic with subungual debris and pain with palpation to nailbeds due to thickness of nails.  Musculoskeletal: Muscle strength 5/5 to all LE muscle groups b/l.  Hallux varus left foot. Hammertoes 2-5 b/l.  Neurological: Sensation intact 5/5 with 10 gram monofilament b/l.  Vibratory sensation intact b/l.  Assessment: 1. Painful onychomycosis toenails 1-5 b/l 2.   Porokeratoses submet head 4 right foot, submet head 2 left foot 3.   NIDDM   Plan: 1. Continue diabetic foot care principles. Literature dispensed on today. 2. Toenails 1-5 b/l were debrided in length and girth without iatrogenic bleeding. 3. Porokeratosis submet head 4 right foot, submet head 2 left foot pared and enucleated with sterile scalpel blade without incident.  4. Patient to continue soft, supportive shoe gear daily. 5. Patient to report any pedal injuries to medical professional immediately. 6. Follow up 3 months.  7. Patient/POA to call should there be a concern in the interim.

## 2020-01-04 ENCOUNTER — Other Ambulatory Visit: Payer: Self-pay | Admitting: Hematology

## 2020-01-04 DIAGNOSIS — C50112 Malignant neoplasm of central portion of left female breast: Secondary | ICD-10-CM

## 2020-01-10 ENCOUNTER — Telehealth: Payer: Self-pay | Admitting: Hematology

## 2020-01-10 NOTE — Telephone Encounter (Signed)
Rescheduled appt per MD email being on PAL.  Spoke with pt and she is aware of her new appt date and time.

## 2020-01-23 ENCOUNTER — Ambulatory Visit (INDEPENDENT_AMBULATORY_CARE_PROVIDER_SITE_OTHER): Payer: Medicare HMO | Admitting: Internal Medicine

## 2020-01-23 ENCOUNTER — Other Ambulatory Visit: Payer: Self-pay

## 2020-01-23 ENCOUNTER — Encounter: Payer: Self-pay | Admitting: Internal Medicine

## 2020-01-23 DIAGNOSIS — E669 Obesity, unspecified: Secondary | ICD-10-CM

## 2020-01-23 DIAGNOSIS — E1165 Type 2 diabetes mellitus with hyperglycemia: Secondary | ICD-10-CM | POA: Diagnosis not present

## 2020-01-23 DIAGNOSIS — E785 Hyperlipidemia, unspecified: Secondary | ICD-10-CM | POA: Diagnosis not present

## 2020-01-23 DIAGNOSIS — Z794 Long term (current) use of insulin: Secondary | ICD-10-CM

## 2020-01-23 MED ORDER — BASAGLAR KWIKPEN 100 UNIT/ML ~~LOC~~ SOPN: 30.0000 [IU] | PEN_INJECTOR | Freq: Every day | SUBCUTANEOUS | 11 refills | Status: DC

## 2020-01-23 MED ORDER — METFORMIN HCL 1000 MG PO TABS: 1000.0000 mg | ORAL_TABLET | Freq: Two times a day (BID) | ORAL | 3 refills | Status: DC

## 2020-01-23 MED ORDER — FARXIGA 10 MG PO TABS: 10.0000 mg | ORAL_TABLET | Freq: Every day | ORAL | 11 refills | Status: DC

## 2020-01-23 MED ORDER — TRUE METRIX BLOOD GLUCOSE TEST VI STRP: 1.0000 | ORAL_STRIP | Freq: Three times a day (TID) | 3 refills | Status: DC

## 2020-01-23 NOTE — Progress Notes (Signed)
Patient ID: Lindsey Marquez, female   DOB: 1951-01-07, 69 y.o.   MRN: [id number]   Patient location: Home My location: Office Persons participating in the virtual visit: patient, provider  Referring Provider: [name], NP  I connected with the patient on 01/23/20 at 10:40 AM EST by telephone (she could not connect to the video app) and verified that I am speaking with the correct person.   I discussed the limitations of evaluation and management by telephone and the availability of in person appointments. The patient expressed understanding and agreed to proceed.   Details of the encounter are shown below.  HPI: Lindsey Marquez is a 69 y.o.-year-old female, returning for follow-up for DM2, dx in ~2001, insulin-dependent, uncontrolled, without long-term complications. Last visit 3.5 months ago.  Reviewed HbA1c levels: Lab Results  Component Value Date   HGBA1C 10.4 (A) 10/04/2019   HGBA1C 9.8 (H) 12/20/2018   HGBA1C 9.4 (A) 09/20/2018   Pt is on: - Basaglar 20 units at bedtime - Metformin 1000 mg 2x a day with meals - Farxiga 10 mg daily before b'fast GLP1 R agonists were all high price.  Pt checks her sugars seldom: - am: 117-130 >> 190, 243, 318 >> 199-240 >> 197  - 2h after b'fast: n/c - before lunch: n/c >> 329 >> 175-200 >> 205 - 2h after lunch: n/c >> 344 >> n/c - before dinner: n/c >> 176, 226 >> 180-195 >> 200s - 2h after dinner: n/c >> 171-190 >> n/c - bedtime:  100, 117, 160-170 >> 244, 266 >> 178-200 >> n/c - nighttime: n/c Lowest sugar was 176 >> 175 >> 197; she has hypoglycemia awareness in the 70s. Highest sugar was 344 >> 240 >> 300 (bubble gum).  Glucometer: True metrix (we cannot download this)  Pt's meals are: - Breakfast:coffee, mc muffin, egg - Lunch: [place] sandwich - Dinner: baked chicken, veggies,dessert - apples, icecream - Snacks: yoghurt, pretzels, PB  -No CKD, last BUN/creatinine:  Lab Results  Component Value Date   BUN 19  08/28/2019   BUN 18 04/05/2019   CREATININE 0.92 08/28/2019   CREATININE 0.99 04/05/2019  On lisinopril 20. -+ HL; last set of lipids: Lab Results  Component Value Date   CHOL 139 03/29/2019   HDL 43.00 03/29/2019   LDLCALC 66 03/29/2019   LDLDIRECT 95.0 10/04/2017   TRIG 154.0 (H) 03/29/2019   CHOLHDL 3 03/29/2019  On Lipitor 5 mg daily, fish oil. - last eye exam was in 2020: No DR -No numbness and tingling in her feet.  On ASA 81.  Pt has FH of DM in brother, sister, father.  + h/o BrCA in 2013, recurrence in 2018.  She is cancer free.    ROS: Constitutional: no weight gain/no weight loss, no fatigue, no subjective hyperthermia, no subjective hypothermia Eyes: no blurry vision, no xerophthalmia ENT: no sore throat, no nodules palpated in neck, no dysphagia, no odynophagia, no hoarseness Cardiovascular: no CP/no SOB/no palpitations/no leg swelling Respiratory: no cough/no SOB/no wheezing Gastrointestinal: no N/no V/no D/no C/no acid reflux Musculoskeletal: no muscle aches/no joint aches Skin: no rashes, no hair loss Neurological: no tremors/no numbness/no tingling/no dizziness  I reviewed pt's medications, allergies, PMH, social hx, family hx, and changes were documented in the history of present illness. Otherwise, unchanged from my initial visit note.  Current Outpatient Medications on File Prior to Visit  Medication Sig  . aspirin 81 MG tablet Take 81 mg by mouth daily as needed (leg pain).   Marland Kitchen  atorvastatin (LIPITOR) 10 MG tablet Take 0.5 tablets (5 mg total) by mouth daily.  . BD PEN NEEDLE NANO U/F 32G X 4 MM MISC USE 1 PEN NEEDLE 2 (TWO) TIMES DAILY.  . dapagliflozin propanediol (FARXIGA) 10 MG TABS tablet Take 10 mg by mouth daily.  Marland Kitchen gabapentin (NEURONTIN) 100 MG capsule TAKE 3 CAPSULES BY MOUTH AT BEDTIME  . glucose blood (TRUE METRIX BLOOD GLUCOSE TEST) test strip 1 each by Other route 3 (three) times daily. Use as instructed  . ibuprofen (ADVIL,MOTRIN) 200 MG  tablet Take 400 mg by mouth every 8 (eight) hours as needed for headache or moderate pain.   . Insulin Glargine (BASAGLAR KWIKPEN) 100 UNIT/ML SOPN Inject 0.2 mLs (20 Units total) into the skin at bedtime.  Marland Kitchen letrozole (FEMARA) 2.5 MG tablet Take 1 tablet (2.5 mg total) by mouth daily.  Marland Kitchen lisinopril-hydrochlorothiazide (PRINZIDE,ZESTORETIC) 20-25 MG tablet Take 1 tablet by mouth daily.  . metFORMIN (GLUCOPHAGE) 1000 MG tablet Take 1 tablet (1,000 mg total) by mouth 2 (two) times daily with a meal.  . NON FORMULARY Peripheral Neuropathy cream from [place]  . Omega-3 Fatty Acids (FISH OIL) 1200 MG CAPS Take 1,200 mg by mouth daily.   . ondansetron (ZOFRAN-ODT) 4 MG disintegrating tablet Take 1 tablet (4 mg total) by mouth every 8 (eight) hours as needed for nausea or vomiting.  . Potassium Chloride ER 20 MEQ TBCR Take 40 mEq daily by mouth.  . torsemide (DEMADEX) 20 MG tablet Take 20 mg by mouth 2 (two) times daily as needed (swelling).   . triamcinolone cream (KENALOG) 0.1 % APPLY 1 APPLICATION TOPICALLY 2 (TWO) TIMES DAILY TO AFFECTED AREAS  . TRUEPLUS LANCETS 33G MISC 1 each by Does not apply route 3 (three) times daily.   No current facility-administered medications on file prior to visit.   Past Medical History:  Diagnosis Date  . Blood transfusion without reported diagnosis   . Breast cancer [facility]) August 2002   Invasive ductal carcinoma Left breast. 2002, 2017  . Diabetes mellitus without complication ([place])   . Family history of malignant neoplasm of ovary   . Family history of pancreatic cancer   . Hypertension    Past Surgical History:  Procedure Laterality Date  . BREAST SURGERY Left 2003  . COLONOSCOPY    . FOOT SURGERY  2000  . INSERTION OF MESH N/A 02/17/2018   Procedure: INSERTION OF MESH;  Surgeon: [name], MD;  Location: [place];  Service: General;  Laterality: N/A;  . MASTECTOMY Left 05/28/2016  . port a cath insertion  05/28/2016  . PORT-A-CATH REMOVAL  Right 08/19/2017   Procedure: REMOVAL PORT-A-CATH;  Surgeon: [name], MD;  Location: [place];  Service: General;  Laterality: Right;  . PORTACATH PLACEMENT Right 05/28/2016   Procedure: INSERTION PORT-A-CATH;  Surgeon: [name], MD;  Location: [place];  Service: General;  Laterality: Right;  . TOTAL MASTECTOMY Left 05/28/2016   Procedure: LEFT  MASTECTOMY;  Surgeon: [name], MD;  Location: [place];  Service: General;  Laterality: Left;  . TUBAL LIGATION  22 yrs since  2004.   Bilateral.  . VENTRAL HERNIA REPAIR  02/17/2018   open  . VENTRAL HERNIA REPAIR N/A 02/17/2018   Procedure: OPEN VENTRAL HERNIA REPAIR WITH MESH;  Surgeon: [name], MD;  Location: [place];  Service: General;  Laterality: N/A;   Social History   Socioeconomic History  . Marital status: Divorced    Spouse name: Not on  file  . Number of children: Not on file  . Years of education: Not on file  . Highest education level: Not on file  Occupational History  . Not on file  Tobacco Use  . Smoking status: Former Smoker    Packs/day: 0.10    Years: 7.00    Pack years: 0.70    Quit date: 12/01/1975    Years since quitting: 44.1  . Smokeless tobacco: Never Used  Substance and Sexual Activity  . Alcohol use: No    Alcohol/week: 0.0 standard drinks  . Drug use: No  . Sexual activity: Yes  Other Topics Concern  . Not on file  Social History Narrative  . Not on file   Social Determinants of Health   Financial Resource Strain:   . Difficulty of Paying Living Expenses: Not on file  Food Insecurity:   . Worried About Charity fundraiser in the Last Year: Not on file  . Ran Out of Food in the Last Year: Not on file  Transportation Needs:   . Lack of Transportation (Medical): Not on file  . Lack of Transportation (Non-Medical): Not on file  Physical Activity:   . Days of Exercise per Week: Not on file  . Minutes of Exercise per Session: Not on file  Stress:   . Feeling of Stress :  Not on file  Social Connections:   . Frequency of Communication with Friends and Family: Not on file  . Frequency of Social Gatherings with Friends and Family: Not on file  . Attends Religious Services: Not on file  . Active Member of Clubs or Organizations: Not on file  . Attends Archivist Meetings: Not on file  . Marital Status: Not on file  Intimate Partner Violence:   . Fear of Current or Ex-Partner: Not on file  . Emotionally Abused: Not on file  . Physically Abused: Not on file  . Sexually Abused: Not on file    Allergies  Allergen Reactions  . Codeine Other (See Comments)    "get crazy", "see things that aren't there"   Family History  Problem Relation Age of Onset  . Prostate cancer Father   . Ovarian cancer Maternal Aunt 70       deceased  . Pancreatic cancer Mother 27       deceased  . Cancer Maternal Aunt        2 other mat aunts; unk. primary in 74s; deceased  . Cancer Maternal Uncle        "bone ca"; unk. primary; deceased 72s  . Prostate cancer Maternal Uncle        deceased 83  . Colon cancer Neg Hx   . Esophageal cancer Neg Hx   . Rectal cancer Neg Hx   . Stomach cancer Neg Hx     PE: wt 230 lbs There were no vitals taken for this visit. Wt Readings from Last 3 Encounters:  10/04/19 232 lb (105.2 kg)  01/03/19 229 lb (103.9 kg)  12/20/18 234 lb (106.1 kg)   Constitutional:  in NAD  The physical exam was not performed (phone visit).  ASSESSMENT: 1. DM2, insulin-dependent, uncontrolled, without long-term complications, but with hyperglycemia  2. HL  3.  Obesity class II  PLAN:  1. Patient with longstanding, uncontrolled, type 2 diabetes, on oral and diabetic regimen and long-acting insulin, restarted at last visit.  In the past, we had to add Basaglar and her sugars improved afterwards but at last visit  they were still above target so increase Basaglar dose and advised her to continue to increase abdomen sugars in the morning were at  goal.  Januvia was expensive for her so I advised her to stop it but we continued Wilder Glade (which was also expensive but she could not afford 1 month at the time) and Metformin.  I advised her to contact me in 1 month to let me know how her sugars are doing and to see if she needs intensification of treatment.  We discussed about different GLP-1 receptor agonist and I advised her to check with her insurance whether these are covered.  These are too expensive.  She did not get in touch with me since then. -At this visit, she is not checking sugars consistently.  She only had 2 sugars since last visit they are all around 200, higher than goal.  We will increase her Basaglar at this visit but I cannot make any other changes in her regimen without consistent checks.  I advised her to start checking sugars consistently again and, if the sugars do not improve after increasing Basaglar, to let me know, as we may need to add mealtime insulin. - I suggested to: Patient Instructions  Please increase: - Basaglar 26 units at bedtime. If the sugars in am >130, increase further to 30 units.  Please continue: - Metformin 1000 mg 2x a day with meals - Farxiga 10 mg daily before b'fast  Please come back for a follow-up appointment in 3 months.  - we will recheck her HbA1c when she returns to the clinic - advised to check sugars at different times of the day - 1-2x a day, rotating check times - advised for yearly eye exams >> she is UTD - return to clinic in 3 months  2. HL - R reviewed latest lipid panel from 03/2019: LDL at goal, triglycerides slightly high Lab Results  Component Value Date   CHOL 139 03/29/2019   HDL 43.00 03/29/2019   LDLCALC 66 03/29/2019   LDLDIRECT 95.0 10/04/2017   TRIG 154.0 (H) 03/29/2019   CHOLHDL 3 03/29/2019  -Continues low-dose Lipitor fish oil without side effects.  3.  Obesity class II -Continue Wilder Glade which should also help with weight loss -At last visit we discussed  about improving her diet -Weight stable since last visit  - time spent with the patient: 12 min, of which >50% was spent in obtaining information about her symptoms, reviewing her previous labs, evaluations, and treatments, counseling her about her conditions (please see the discussed topics above), and developing a plan to further investigate and treat them.  Philemon Kingdom, MD PhD [facility] Endocrinology

## 2020-01-23 NOTE — Patient Instructions (Addendum)
Please increase: - Basaglar 26 units at bedtime. If the sugars in am >130, increase further to 30 units.  Please continue: - Metformin 1000 mg 2x a day with meals - Farxiga 10 mg daily before b'fast  Please come back for a follow-up appointment in 3 months.

## 2020-01-25 ENCOUNTER — Ambulatory Visit: Payer: Medicare HMO | Attending: Internal Medicine

## 2020-01-25 DIAGNOSIS — Z23 Encounter for immunization: Secondary | ICD-10-CM | POA: Insufficient documentation

## 2020-01-25 NOTE — Progress Notes (Signed)
   Covid-19 Vaccination Clinic  Name:  Lindsey Marquez    MRN: [id number] DOB: 12/14/1950  01/25/2020  [name] was observed post Covid-19 immunization for 15 minutes without incidence. She was provided with Vaccine Information Sheet and instruction to access the V-Safe system.   [name] was instructed to call 911 with any severe reactions post vaccine: Marland Kitchen Difficulty breathing  . Swelling of your face and throat  . A fast heartbeat  . A bad rash all over your body  . Dizziness and weakness    Immunizations Administered    Name Date Dose VIS Date Route   Pfizer COVID-19 Vaccine 01/25/2020  2:04 PM 0.3 mL 11/10/2019 Intramuscular   Manufacturer: [place]   Lot: [id number]   [place]: [id number]

## 2020-02-06 ENCOUNTER — Ambulatory Visit: Payer: Medicare HMO | Admitting: Podiatry

## 2020-02-06 ENCOUNTER — Encounter: Payer: Self-pay | Admitting: Podiatry

## 2020-02-06 ENCOUNTER — Other Ambulatory Visit: Payer: Self-pay

## 2020-02-06 DIAGNOSIS — M79675 Pain in left toe(s): Secondary | ICD-10-CM | POA: Diagnosis not present

## 2020-02-06 DIAGNOSIS — E119 Type 2 diabetes mellitus without complications: Secondary | ICD-10-CM | POA: Diagnosis not present

## 2020-02-06 DIAGNOSIS — M2032 Hallux varus (acquired), left foot: Secondary | ICD-10-CM | POA: Diagnosis not present

## 2020-02-06 DIAGNOSIS — Q828 Other specified congenital malformations of skin: Secondary | ICD-10-CM

## 2020-02-06 DIAGNOSIS — E1165 Type 2 diabetes mellitus with hyperglycemia: Secondary | ICD-10-CM | POA: Diagnosis not present

## 2020-02-06 DIAGNOSIS — M2041 Other hammer toe(s) (acquired), right foot: Secondary | ICD-10-CM

## 2020-02-06 DIAGNOSIS — M79674 Pain in right toe(s): Secondary | ICD-10-CM | POA: Diagnosis not present

## 2020-02-06 DIAGNOSIS — M2042 Other hammer toe(s) (acquired), left foot: Secondary | ICD-10-CM

## 2020-02-06 DIAGNOSIS — B351 Tinea unguium: Secondary | ICD-10-CM | POA: Diagnosis not present

## 2020-02-06 DIAGNOSIS — Z794 Long term (current) use of insulin: Secondary | ICD-10-CM | POA: Diagnosis not present

## 2020-02-06 NOTE — Patient Instructions (Signed)
Diabetes Mellitus and Foot Care Foot care is an important part of your health, especially when you have diabetes. Diabetes may cause you to have problems because of poor blood flow (circulation) to your feet and legs, which can cause your skin to:  Become thinner and drier.  Break more easily.  Heal more slowly.  Peel and crack. You may also have nerve damage (neuropathy) in your legs and feet, causing decreased feeling in them. This means that you may not notice minor injuries to your feet that could lead to more serious problems. Noticing and addressing any potential problems early is the best way to prevent future foot problems. How to care for your feet Foot hygiene  Wash your feet daily with warm water and mild soap. Do not use hot water. Then, pat your feet and the areas between your toes until they are completely dry. Do not soak your feet as this can dry your skin.  Trim your toenails straight across. Do not dig under them or around the cuticle. File the edges of your nails with an emery board or nail file.  Apply a moisturizing lotion or petroleum jelly to the skin on your feet and to dry, brittle toenails. Use lotion that does not contain alcohol and is unscented. Do not apply lotion between your toes. Shoes and socks  Wear clean socks or stockings every day. Make sure they are not too tight. Do not wear knee-high stockings since they may decrease blood flow to your legs.  Wear shoes that fit properly and have enough cushioning. Always look in your shoes before you put them on to be sure there are no objects inside.  To break in new shoes, wear them for just a few hours a day. This prevents injuries on your feet. Wounds, scrapes, corns, and calluses  Check your feet daily for blisters, cuts, bruises, sores, and redness. If you cannot see the bottom of your feet, use a mirror or ask someone for help.  Do not cut corns or calluses or try to remove them with medicine.  If you  find a minor scrape, cut, or break in the skin on your feet, keep it and the skin around it clean and dry. You may clean these areas with mild soap and water. Do not clean the area with peroxide, alcohol, or iodine.  If you have a wound, scrape, corn, or callus on your foot, look at it several times a day to make sure it is healing and not infected. Check for: ? Redness, swelling, or pain. ? Fluid or blood. ? Warmth. ? Pus or a bad smell. General instructions  Do not cross your legs. This may decrease blood flow to your feet.  Do not use heating pads or hot water bottles on your feet. They may burn your skin. If you have lost feeling in your feet or legs, you may not know this is happening until it is too late.  Protect your feet from hot and cold by wearing shoes, such as at the beach or on hot pavement.  Schedule a complete foot exam at least once a year (annually) or more often if you have foot problems. If you have foot problems, report any cuts, sores, or bruises to your health care provider immediately. Contact a health care provider if:  You have a medical condition that increases your risk of infection and you have any cuts, sores, or bruises on your feet.  You have an injury that is not   healing.  You have redness on your legs or feet.  You feel burning or tingling in your legs or feet.  You have pain or cramps in your legs and feet.  Your legs or feet are numb.  Your feet always feel cold.  You have pain around a toenail. Get help right away if:  You have a wound, scrape, corn, or callus on your foot and: ? You have pain, swelling, or redness that gets worse. ? You have fluid or blood coming from the wound, scrape, corn, or callus. ? Your wound, scrape, corn, or callus feels warm to the touch. ? You have pus or a bad smell coming from the wound, scrape, corn, or callus. ? You have a fever. ? You have a red line going up your leg. Summary  Check your feet every day  for cuts, sores, red spots, swelling, and blisters.  Moisturize feet and legs daily.  Wear shoes that fit properly and have enough cushioning.  If you have foot problems, report any cuts, sores, or bruises to your health care provider immediately.  Schedule a complete foot exam at least once a year (annually) or more often if you have foot problems. This information is not intended to replace advice given to you by your health care provider. Make sure you discuss any questions you have with your health care provider. Document Revised: 08/09/2019 Document Reviewed: 12/18/2016 Elsevier Patient Education  2020 Elsevier Inc.  

## 2020-02-10 ENCOUNTER — Other Ambulatory Visit: Payer: Self-pay | Admitting: Hematology

## 2020-02-10 DIAGNOSIS — C50112 Malignant neoplasm of central portion of left female breast: Secondary | ICD-10-CM

## 2020-02-12 ENCOUNTER — Other Ambulatory Visit: Payer: Self-pay | Admitting: Internal Medicine

## 2020-02-12 NOTE — Progress Notes (Addendum)
Subjective: Lindsey Marquez presents today for follow up of preventative diabetic foot care, for diabetic foot evaluation and painful porokeratotic lesion(s) plantar aspect of both feet and painful mycotic toenails b/l that limit ambulation. Aggravating factors include weightbearing with and without shoe gear. Pain for both is relieved with periodic professional debridement.   She voices no new pedal concerns on today's visit.  Allergies  Allergen Reactions  . Codeine Other (See Comments)    "get crazy", "see things that aren't there"     Objective: There were no vitals filed for this visit.  Pt 69 y.o. year old female  in NAD. AAO x 3.   Vascular Examination:  Capillary refill time to digits immediate b/l. Palpable DP pulses b/l. Palpable PT pulses b/l. Pedal hair absent b/l Skin temperature gradient within normal limits b/l.  Dermatological Examination: Pedal skin with normal turgor, texture and tone bilaterally. No open wounds bilaterally. No interdigital macerations bilaterally. Toenails 1-5 b/l elongated, dystrophic, thickened, crumbly with subungual debris and tenderness to dorsal palpation. Porokeratotic lesion(s) submet head 4 right, submet head 2 left foot with pain on palpation. No erythema, no edema, no drainage, no flocculence.  Musculoskeletal: Normal muscle strength 5/5 to all lower extremity muscle groups bilaterally, no pain crepitus or joint limitation noted with ROM b/l, hammertoes noted to the  2-5 bilaterally and hallux varus left foot.  Neurological: Protective sensation intact 5/5 intact bilaterally with 10g monofilament b/l Vibratory sensation intact b/l Proprioception intact bilaterally  Assessment: 1. Pain due to onychomycosis of toenails of both feet   2. Porokeratosis   3. Acquired hammertoes of both feet   4. Acquired hallux varus of left foot   5. Type 2 diabetes mellitus with hyperglycemia, with long-term current use of insulin ([facility])   6. Encounter for  diabetic foot exam ([place])    Plan: -Diabetic foot examination performed on today's visit. -Continue diabetic foot care principles. Literature dispensed on today.  -Toenails 1-5 b/l were debrided in length and girth with sterile nail nippers and dremel without iatrogenic bleeding.  -Painful porokeratotic lesions submet head 4 right foot, submet head 2 left foot pared and enucleated with sterile scalpel blade without incident. -Patient to continue soft, supportive shoe gear daily. -Patient to report any pedal injuries to medical professional immediately. -Patient/POA to call should there be question/concern in the interim.  Return in about 3 months (around 05/08/2020) for diabetic nail and callus trim.

## 2020-02-19 NOTE — Progress Notes (Signed)
Lindsey Marquez   Telephone:([phone]) [phone] Fax:(336) [phone]   Clinic Follow up Note   Patient Care Team: Lindsey Fenton, NP as PCP - General (Internal Medicine) Lindsey Mesa, MD as Consulting Physician (General Surgery) Lindsey Merle, MD as Consulting Physician (Hematology) Lindsey Bison Charlestine Massed, NP as Nurse Practitioner (Hematology and Oncology)  Date of Service:  03/01/2020  CHIEF COMPLAINT: Follow up recurrent left breast cancer  SUMMARY OF ONCOLOGIC HISTORY: Oncology History Overview Note  Cancer of central portion of left breast [facility])   Staging form: Breast, AJCC 7th Edition     Clinical stage from 04/17/2016: Stage IA (T1c, N0, M0) - Signed by Lindsey Merle, MD on 05/07/2016     Pathologic stage from 05/28/2016: Stage IIA (T2, N0, cM0) - Signed by Lindsey Merle, MD on 06/11/2016 History of left breast cancer   Staging form: Breast, AJCC 7th Edition     Clinical: Stage IIIB (T4d, N1, cM0) - Signed by Lindsey Lark, MD on 12/14/2013     Pathologic: Stage IIIB (T4d, N1a, cM0) - Signed by Lindsey Lark, MD on 12/14/2013     History of left breast cancer (Resolved)  09/12/2002 Imaging   CT scan show no evidence of disease apart from the left axilla   09/2002 -  Neo-Adjuvant Chemotherapy   TAC X4 cycles    01/10/2003 Surgery   The patient had left lumpectomy and axillary lymph node dissection which show residual breast cancer and a sentinel lymph node was positive   2004 -  Adjuvant Chemotherapy   Cytoxan with 5-FU x4 cycles (methotrexate added at cycle 4).   2004 -  Radiation Therapy   Adjuvant left breast radiation   12/2002 Receptors her2   ER, PR and HER-2 were all negative.   Cancer of central portion of left breast ([place])  04/13/2016 Mammogram   Scheduler coarse heterogeneous calcifications spanning an area of 3.7 cm in the lumpectomy site, indeterminate. Ultrasound of the left axilla was negative.   04/17/2016 Initial Diagnosis   Cancer of central portion of left breast  ([place])   04/17/2016 Initial Biopsy   Left breast core needle biopsy showed invasive and in situ ductal carcinoma with calcification, grade 2.   04/17/2016 Receptors her2   ER 100% positive, PR 15% positive, strong staining, Ki-67 10%, HER-2 positive by IHC (3)   05/05/2016 Imaging   Bilateral breast MRI showed a 1.3 x 0.8 x 0.7 cm biopsy-proven ductal carcinoma in the region of the previous lumpectomy. Enlarged lobulated right inferior axillary lymph node with diffuse cortical thickening, biopsy is recommended.   05/15/2016 Pathology Results   right axilary node biopsy was negative for malignant cell    05/28/2016 Surgery   Simple left mastectomy   05/28/2016 Pathology Results   Left mastectomy showed invasive ductal carcinoma, grade 3, 3.6 cm, high grade DCIS, (+) LVI, surgical margins were negative. Tumor 3.6 cm, no lymph nodes identified.   06/25/2016 Imaging   Staging CT scan of the chest, abdomen and pelvis with contrast and a bone scan showed no evidence of metastasis. Postoperative fluid collection in the left breast, likely a seroma or hematoma. Right axillary adenopathy, which was biopsied previously.    07/07/2016 - 07/15/2017 Chemotherapy   Adjuvant chemotherapy with docetaxel, carboplatin, Herceptin and pejeta every 3 weeks, for 6 cycles, followed by Herceptin maintenance therapy for total of one year treatment  Ended [facility] 11/04/16     12/15/2016 -  Anti-estrogen oral therapy   Adjuvant letrozole 2.5 mg once  daily   04/29/2017 Mammogram    Mammogram 04/29/2017 IMPRESSION: No mammographic evidence of malignancy. A result letter of this screening mammogram will be mailed directly to the patient.    08/26/2017 - 07/2018 Antibody Plan   She started Nerlynx 273m on 08/26/17, was held on 09/14/17 due to significant diarrhea and restarted when her diarrhea resolved on 09/21/17. Completed in 07/2018   09/22/2017 Procedure   Colonoscopy by Dr. NSilverio Decamp IMPRESSION: - One 3 mm polyp in  the transverse colon, removed with a cold biopsy forceps. Resected and retrieved. - Non-bleeding internal hemorrhoids.   09/22/2017 Pathology Results   Diagnosis, colonoscopy  Surgical [P], transverse, polyp - TUBULAR ADENOMA(S). - HIGH GRADE DYSPLASIA IS NOT IDENTIFIED.      CURRENT THERAPY:  Letrozole 2.5 mg daily, started on 12/15/2016  INTERVAL HISTORY:  Lindsey WIRICKis here for a follow up of left breast cancer. She was last seen by me 10 months ago and seen by NP Lacie 6 months ago in interim. She presents to the clinic alone. She notes she is doing well. She notes mild discomfort in her left axilla when moving her left arm. She denies hot flashes. She notes she uses Gabapentin for her neuropathy which is stable.     REVIEW OF SYSTEMS:   Constitutional: Denies fevers, chills or abnormal weight loss Eyes: Denies blurriness of vision Ears, nose, mouth, throat, and face: Denies mucositis or sore throat Respiratory: Denies cough, dyspnea or wheezes Cardiovascular: Denies palpitation, chest discomfort or lower extremity swelling Gastrointestinal:  Denies nausea, heartburn or change in bowel habits Skin: Denies abnormal skin rashes MSK: (+) Neuropathy  Lymphatics: Denies new lymphadenopathy or easy bruising Neurological:Denies numbness, tingling or new weaknesses Behavioral/Psych: Mood is stable, no new changes  All other systems were reviewed with the patient and are negative.  MEDICAL HISTORY:  Past Medical History:  Diagnosis Date  . Blood transfusion without reported diagnosis   . Breast cancer ([facility] August 2002   Invasive ductal carcinoma Left breast. 2002, 2017  . Diabetes [place] complication [place]   . Family history of malignant neoplasm of ovary   . Family history of pancreatic cancer   . Hypertension     SURGICAL HISTORY: Past Surgical History:  Procedure Laterality Date  . BREAST SURGERY Left 2003  . COLONOSCOPY    . FOOT SURGERY  2000  .  INSERTION OF MESH N/A 02/17/2018   Procedure: INSERTION OF MESH;  Surgeon: TDonnie Mesa MD;  [place]  Service: General;  Laterality: N/A;  . MASTECTOMY Left 05/28/2016  . port a cath insertion  05/28/2016  . PORT-A-CATH REMOVAL Right 08/19/2017   Procedure: REMOVAL PORT-A-CATH;  Surgeon: TDonnie Mesa MD;  [place] [facility]  Service: General;  Laterality: Right;  . PORTACATH PLACEMENT Right 05/28/2016   Procedure: INSERTION PORT-A-CATH;  Surgeon: MDonnie Mesa MD;  [place]  Service: General;  Laterality: Right;  . TOTAL MASTECTOMY Left 05/28/2016   Procedure: LEFT  MASTECTOMY;  Surgeon: MDonnie Mesa MD;  [place]  Service: General;  Laterality: Left;  . TUBAL LIGATION  22 yrs since  2004.   Bilateral.  . VENTRAL HERNIA REPAIR  02/17/2018   open  . VENTRAL HERNIA REPAIR N/A 02/17/2018   Procedure: OPEN VENTRAL HERNIA REPAIR WITH MESH;  Surgeon: TDonnie Mesa MD;  [place] [facility]  Service: General;  Laterality: N/A;    I have reviewed the social history and family history with the patient and  they are unchanged from previous note.  ALLERGIES:  is allergic to codeine.  MEDICATIONS:  Current Outpatient Medications  Medication Sig Dispense Refill  . aspirin 81 MG tablet Take 81 mg by mouth daily as needed (leg pain).     Marland Kitchen atorvastatin (LIPITOR) 10 MG tablet Take 0.5 tablets (5 mg total) by mouth daily. 45 tablet 0  . BD PEN NEEDLE NANO 2ND GEN 32G X 4 MM MISC USE 1 PEN NEEDLE 2 (TWO) TIMES DAILY. 200 each 0  . dapagliflozin propanediol (FARXIGA) 10 MG TABS tablet Take 10 mg by mouth daily. 30 tablet 11  . gabapentin (NEURONTIN) 300 MG capsule Take 1 capsule (300 mg total) by mouth at bedtime. 90 capsule 1  . glucose blood (TRUE METRIX BLOOD GLUCOSE TEST) test strip 1 each by Other route 3 (three) times daily. Use as instructed 300 each 3  . ibuprofen (ADVIL,MOTRIN) 200 MG tablet Take 400 mg by mouth every 8 (eight) hours as needed for  headache or moderate pain.     . Insulin Glargine (BASAGLAR KWIKPEN) 100 UNIT/ML SOPN Inject 0.3 mLs (30 Units total) into the skin at bedtime. 15 mL 11  . letrozole (FEMARA) 2.5 MG tablet Take 1 tablet (2.5 mg total) by mouth daily. 90 tablet 3  . lisinopril-hydrochlorothiazide (PRINZIDE,ZESTORETIC) 20-25 MG tablet Take 1 tablet by mouth daily. 90 tablet 2  . metFORMIN (GLUCOPHAGE) 1000 MG tablet Take 1 tablet (1,000 mg total) by mouth 2 (two) times daily with a meal. 180 tablet 3  . NON FORMULARY Peripheral Neuropathy cream from [place]    . Omega-3 Fatty Acids (FISH OIL) 1200 MG CAPS Take 1,200 mg by mouth daily.     . ondansetron (ZOFRAN-ODT) 4 MG disintegrating tablet Take 1 tablet (4 mg total) by mouth every 8 (eight) hours as needed for nausea or vomiting. 20 tablet 1  . Potassium Chloride ER 20 MEQ TBCR Take 40 mEq daily by mouth. 60 tablet 2  . Potassium Gluconate 2.5 MEQ TABS Take by mouth.    . torsemide (DEMADEX) 20 MG tablet Take 20 mg by mouth 2 (two) times daily as needed (swelling).     . triamcinolone cream (KENALOG) 0.1 % APPLY 1 APPLICATION TOPICALLY 2 (TWO) TIMES DAILY TO AFFECTED AREAS 80 g 0  . TRUEPLUS LANCETS 33G MISC 1 each by Does not apply route 3 (three) times daily. 600 each 2   No current facility-administered medications for this visit.    PHYSICAL EXAMINATION: ECOG PERFORMANCE STATUS: 1 - Symptomatic but completely ambulatory  Vitals:   03/01/20 1054  BP: (!) 152/67  Pulse: 99  Resp: 20  Temp: 98.7 F (37.1 C)  SpO2: 100%   Filed Weights   03/01/20 1054  Weight: 229 lb 8 oz (104.1 kg)    GENERAL:alert, no distress and comfortable SKIN: skin color, texture, turgor are normal, no rashes or significant lesions (+) Multiple skin moles on her Lower legs  EYES: normal, Conjunctiva are pink and non-injected, sclera clear  NECK: supple, thyroid normal size, non-tender, without nodularity LYMPH:  no palpable lymphadenopathy in the cervical,  axillary  LUNGS: clear to auscultation and percussion with normal breathing effort HEART: regular rate & rhythm and no murmurs and no lower extremity edema ABDOMEN:abdomen soft, non-tender and normal bowel sounds Musculoskeletal:no cyanosis of digits and no clubbing  NEURO: alert & oriented x 3 with fluent speech, no focal motor/sensory deficits BREAST: S/p left mastectomy: Surgical incision healed well. No palpable mass, nodules or adenopathy bilaterally.  Breast exam benign.   LABORATORY DATA:  I have reviewed the data as listed CBC Latest Ref Rng & Units 03/01/2020 08/28/2019 04/05/2019  WBC 4.0 - 10.5 K/uL 7.8 7.4 7.5  Hemoglobin 12.0 - 15.0 g/dL 12.2 12.2 12.1  Hematocrit 36.0 - 46.0 % 37.4 37.2 36.5  Platelets 150 - 400 K/uL 322 298 291     CMP Latest Ref Rng & Units 03/01/2020 08/28/2019 04/05/2019  Glucose 70 - 99 mg/dL 157(H) 241(H) 215(H)  BUN 8 - 23 mg/dL 18 19 18   Creatinine 0.44 - 1.00 mg/dL 0.96 0.92 0.99  Sodium 135 - 145 mmol/L 139 138 139  Potassium 3.5 - 5.1 mmol/L 4.1 4.5 4.2  Chloride 98 - 111 mmol/L 104 103 104  CO2 22 - 32 mmol/L 23 26 24   Calcium 8.9 - 10.3 mg/dL 9.6 9.5 9.5  Total Protein 6.5 - 8.1 g/dL 8.1 8.0 8.1  Total Bilirubin 0.3 - 1.2 mg/dL 0.5 0.6 0.5  Alkaline Phos 38 - 126 U/L 72 86 73  AST 15 - 41 U/L 34 31 34  ALT 0 - 44 U/L 36 35 39      RADIOGRAPHIC STUDIES: I have personally reviewed the radiological images as listed and agreed with the findings in the report. No results found.   ASSESSMENT & PLAN:  Lindsey Marquez is a 69 y.o. female with    1. 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 She was treated with Neo-adjuvant chemo, left breast lumpectomy, adjuvant chemo and Radiation.  -She had another left breast cancer in 03/2016. She was treated with left mastectomy, adjuvant chemo TCHP and Nerlynx.  -She started letrozole in 11/2016. Tolerating well. Plan to  continue up to 10 years.  -She is clinically doing well. Lab reviewed, her CBC and CMP are within normal limits. Her physical exam and her 05/2019 mammogram were unremarkable. There is no clinical concern for recurrence. -Continue surveillance. Next mammogram in 06/2020 -Continue Letrozole  -f/u in 6 months with NP Lacie   2. DM and HTN  -Her HTN is moderately controlled and her DM is not well controlled, 1/202 A1c at 9.8.  -previously I strongly encouraged her to monitor her blood pressure and glucose at home, and consider increasing the dose of metformin and glyburide if needed   -She has lost some weight and continues to better manage her DM. I encouraged her to continue and f/u with her PCP.    3. 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 She will continue to wear compression socks.  -She is on Gabapentin 311m nightly currently. I refilled today (03/01/20).  4. Obesity  -I again encouraged her to eat healthy and exercise regularly, she is willing to lose some weight after she completes chemotherapy.  5. Bone Health -We previously discussed that Letrozole can cause some bone weakening  -Her 03/2017 DEXA showed Femur Neck Right T-score is -0.8. This patient is considered normal. Her DEXA from 05/2019 shows improvement with lowest t-score 0.8 at [facility].  -I encouraged her to take calcium and vitamin D. We'll monitor her Vit D levels.  Plan -I refilled Gabapentin at 3026mHS today  -Continue letrozole daily    -Lab and f/u in 6 months with NP Lacie  -Mammogram in 06/2020    No problem-specific Assessment & Plan notes found for  this encounter.   Orders Placed This Encounter  Procedures  . MM Digital Screening Unilat R    Standing Status:   Future    Standing Expiration Date:   03/01/2021    Order Specific Question:   Reason for Exam (SYMPTOM  OR DIAGNOSIS REQUIRED)    Answer:   screening      Order Specific Question:   Preferred imaging location?    Answer:   [facility]   All questions were answered. The patient knows to call the clinic with any problems, questions or concerns. No barriers to learning was detected. The total time spent in the appointment was 25 minutes.     Lindsey Merle, MD 03/01/2020   I, Joslyn Devon, am acting as scribe for Lindsey Merle, MD.   I have reviewed the above documentation for accuracy and completeness, and I agree with the above.

## 2020-02-20 ENCOUNTER — Ambulatory Visit: Payer: Medicare HMO | Attending: Internal Medicine

## 2020-02-20 DIAGNOSIS — Z23 Encounter for immunization: Secondary | ICD-10-CM

## 2020-02-20 NOTE — Progress Notes (Signed)
   Covid-19 Vaccination Clinic  Name:  Lindsey Marquez    MRN: [id number] DOB: Nov 02, 1951  02/20/2020  [name] was observed post Covid-19 immunization for 15 minutes without incident. She was provided with Vaccine Information Sheet and instruction to access the V-Safe system.   [name] was instructed to call 911 with any severe reactions post vaccine: Marland Kitchen Difficulty breathing  . Swelling of face and throat  . A fast heartbeat  . A bad rash all over body  . Dizziness and weakness   Immunizations Administered    Name Date Dose VIS Date Route   Pfizer COVID-19 Vaccine 02/20/2020  9:48 AM 0.3 mL 11/10/2019 Intramuscular   Manufacturer: [place]   Lot: [id number]   [place]: [id number]

## 2020-02-26 ENCOUNTER — Other Ambulatory Visit: Payer: Medicare HMO

## 2020-02-26 ENCOUNTER — Ambulatory Visit: Payer: Medicare HMO | Admitting: Hematology

## 2020-03-01 ENCOUNTER — Encounter: Payer: Self-pay | Admitting: Hematology

## 2020-03-01 ENCOUNTER — Telehealth: Payer: Self-pay | Admitting: Hematology

## 2020-03-01 ENCOUNTER — Other Ambulatory Visit: Payer: Self-pay

## 2020-03-01 ENCOUNTER — Inpatient Hospital Stay (HOSPITAL_BASED_OUTPATIENT_CLINIC_OR_DEPARTMENT_OTHER): Payer: Medicare HMO | Admitting: Hematology

## 2020-03-01 ENCOUNTER — Inpatient Hospital Stay: Payer: Medicare HMO | Attending: Hematology

## 2020-03-01 VITALS — BP 152/67 | HR 99 | Temp 98.7°F | Resp 20 | Ht 68.0 in | Wt 229.5 lb

## 2020-03-01 DIAGNOSIS — Z794 Long term (current) use of insulin: Secondary | ICD-10-CM | POA: Insufficient documentation

## 2020-03-01 DIAGNOSIS — E1040 Type 1 diabetes mellitus with diabetic neuropathy, unspecified: Secondary | ICD-10-CM | POA: Diagnosis not present

## 2020-03-01 DIAGNOSIS — C50112 Malignant neoplasm of central portion of left female breast: Secondary | ICD-10-CM | POA: Diagnosis not present

## 2020-03-01 DIAGNOSIS — Z79811 Long term (current) use of aromatase inhibitors: Secondary | ICD-10-CM | POA: Diagnosis not present

## 2020-03-01 DIAGNOSIS — I10 Essential (primary) hypertension: Secondary | ICD-10-CM

## 2020-03-01 DIAGNOSIS — Z923 Personal history of irradiation: Secondary | ICD-10-CM | POA: Diagnosis not present

## 2020-03-01 DIAGNOSIS — Z170 Estrogen receptor positive status [ER+]: Secondary | ICD-10-CM | POA: Insufficient documentation

## 2020-03-01 DIAGNOSIS — Z1231 Encounter for screening mammogram for malignant neoplasm of breast: Secondary | ICD-10-CM | POA: Diagnosis not present

## 2020-03-01 DIAGNOSIS — Z800 Family history of malignant neoplasm of digestive organs: Secondary | ICD-10-CM | POA: Diagnosis not present

## 2020-03-01 DIAGNOSIS — Z9012 Acquired absence of left breast and nipple: Secondary | ICD-10-CM | POA: Insufficient documentation

## 2020-03-01 DIAGNOSIS — Z79899 Other long term (current) drug therapy: Secondary | ICD-10-CM | POA: Insufficient documentation

## 2020-03-01 DIAGNOSIS — Z7982 Long term (current) use of aspirin: Secondary | ICD-10-CM | POA: Insufficient documentation

## 2020-03-01 DIAGNOSIS — Z9221 Personal history of antineoplastic chemotherapy: Secondary | ICD-10-CM | POA: Diagnosis not present

## 2020-03-01 DIAGNOSIS — E1165 Type 2 diabetes mellitus with hyperglycemia: Secondary | ICD-10-CM

## 2020-03-01 DIAGNOSIS — E669 Obesity, unspecified: Secondary | ICD-10-CM | POA: Insufficient documentation

## 2020-03-01 DIAGNOSIS — Z8041 Family history of malignant neoplasm of ovary: Secondary | ICD-10-CM | POA: Diagnosis not present

## 2020-03-01 DIAGNOSIS — Z791 Long term (current) use of non-steroidal anti-inflammatories (NSAID): Secondary | ICD-10-CM | POA: Insufficient documentation

## 2020-03-01 LAB — COMPREHENSIVE METABOLIC PANEL
ALT: 36 U/L (ref 0–44)
AST: 34 U/L (ref 15–41)
Albumin: 4.1 g/dL (ref 3.5–5.0)
Alkaline Phosphatase: 72 U/L (ref 38–126)
Anion gap: 12 (ref 5–15)
BUN: 18 mg/dL (ref 8–23)
CO2: 23 mmol/L (ref 22–32)
Calcium: 9.6 mg/dL (ref 8.9–10.3)
Chloride: 104 mmol/L (ref 98–111)
Creatinine, Ser: 0.96 mg/dL (ref 0.44–1.00)
GFR calc Af Amer: >60 mL/min (ref >60)
GFR calc non Af Amer: >60 mL/min (ref >60)
Glucose, Bld: 157 mg/dL — ABNORMAL HIGH (ref 70–99)
Potassium: 4.1 mmol/L (ref 3.5–5.1)
Sodium: 139 mmol/L (ref 135–145)
Total Bilirubin: 0.5 mg/dL (ref 0.3–1.2)
Total Protein: 8.1 g/dL (ref 6.5–8.1)

## 2020-03-01 LAB — CBC WITH DIFFERENTIAL (CANCER CENTER ONLY)
Abs Immature Granulocytes: 0.02 10*3/uL (ref 0.00–0.07)
Basophils Absolute: 0.1 10*3/uL (ref 0.0–0.1)
Basophils Relative: 1 %
Eosinophils Absolute: 0.2 10*3/uL (ref 0.0–0.5)
Eosinophils Relative: 2 %
HCT: 37.4 % (ref 36.0–46.0)
Hemoglobin: 12.2 g/dL (ref 12.0–15.0)
Immature Granulocytes: 0 %
Lymphocytes Relative: 33 %
Lymphs Abs: 2.5 10*3/uL (ref 0.7–4.0)
MCH: 33.2 pg (ref 26.0–34.0)
MCHC: 32.6 g/dL (ref 30.0–36.0)
MCV: 101.9 fL — ABNORMAL HIGH (ref 80.0–100.0)
Monocytes Absolute: 0.7 10*3/uL (ref 0.1–1.0)
Monocytes Relative: 8 %
Neutro Abs: 4.4 10*3/uL (ref 1.7–7.7)
Neutrophils Relative %: 56 %
Platelet Count: 322 10*3/uL (ref 150–400)
RBC: 3.67 MIL/uL — ABNORMAL LOW (ref 3.87–5.11)
RDW: 12.4 % (ref 11.5–15.5)
WBC Count: 7.8 10*3/uL (ref 4.0–10.5)
nRBC: 0 % (ref 0.0–0.2)

## 2020-03-01 MED ORDER — LETROZOLE 2.5 MG PO TABS: 2.5000 mg | ORAL_TABLET | Freq: Every day | ORAL | 3 refills | Status: DC

## 2020-03-01 MED ORDER — GABAPENTIN 300 MG PO CAPS: 300.0000 mg | ORAL_CAPSULE | Freq: Every day | ORAL | 1 refills | Status: DC

## 2020-03-01 NOTE — Telephone Encounter (Signed)
Scheduled per los. Gave avs and calendar  

## 2020-03-12 ENCOUNTER — Encounter: Payer: Self-pay | Admitting: Internal Medicine

## 2020-03-12 ENCOUNTER — Telehealth: Payer: Self-pay

## 2020-03-12 ENCOUNTER — Other Ambulatory Visit: Payer: Self-pay

## 2020-03-12 ENCOUNTER — Ambulatory Visit (INDEPENDENT_AMBULATORY_CARE_PROVIDER_SITE_OTHER): Payer: Medicare HMO | Admitting: Internal Medicine

## 2020-03-12 VITALS — BP 138/82 | HR 83 | Temp 98.0°F | Wt 229.0 lb

## 2020-03-12 DIAGNOSIS — L2082 Flexural eczema: Secondary | ICD-10-CM | POA: Diagnosis not present

## 2020-03-12 MED ORDER — TRIAMCINOLONE ACETONIDE 0.1 % EX CREA: TOPICAL_CREAM | CUTANEOUS | 0 refills | Status: DC

## 2020-03-12 NOTE — Telephone Encounter (Signed)
[facility] Night - Client Nonclinical Telephone Record AccessNurse Client [facility] Night - Client Client Site [place] Physician Webb Silversmith - NP Contact Type Call Who Is Calling Patient / Member / Family / Caregiver Caller Name [place] Phone Number [phone] Patient Name Lindsey Marquez Patient DOB 1951/04/22 Call Type Message Only Information Provided Reason for Call Request for General Office Information Initial Comment Caller states she is needing to confirm her appointment time for today. Additional Comment Office hours were provided. Disp. Time Disposition Final User 03/12/2020 8:02:00 AM General Information Provided Yes Christel Mormon Call Closed By: Christel Mormon Transaction Date/Time: 03/12/2020 8:00:09 AM (ET)

## 2020-03-12 NOTE — Telephone Encounter (Signed)
I spoke with pt and pt is already aware has appt today at 11:15 in office with Avie Echevaria NP. Nothing further needed.

## 2020-03-12 NOTE — Patient Instructions (Signed)

## 2020-03-12 NOTE — Progress Notes (Signed)
Subjective:    Patient ID: Lindsey Marquez, female    DOB: [birth year], 69 y.o.   MRN: [id number]  HPI  Pt presents to the clinic today with c/o dark spots around her elbows. She noticed this 2-3 months ago. The rash does not itch or burn. It has not spread. She denies changes in soap, lotions or detergents. No one in her home has a similar rash. She has tried Hydrocortisone cream without any relief.   Review of Systems  Past Medical History:  Diagnosis Date  . Blood transfusion without reported diagnosis   . Breast cancer [facility]) August 2002   Invasive ductal carcinoma Left breast. 2002, 2017  . Diabetes mellitus without complication ([place])   . Family history of malignant neoplasm of ovary   . Family history of pancreatic cancer   . Hypertension     Current Outpatient Medications  Medication Sig Dispense Refill  . aspirin 81 MG tablet Take 81 mg by mouth daily as needed (leg pain).     Marland Kitchen atorvastatin (LIPITOR) 10 MG tablet Take 0.5 tablets (5 mg total) by mouth daily. 45 tablet 0  . BD PEN NEEDLE NANO 2ND GEN 32G X 4 MM MISC USE 1 PEN NEEDLE 2 (TWO) TIMES DAILY. 200 each 0  . dapagliflozin propanediol (FARXIGA) 10 MG TABS tablet Take 10 mg by mouth daily. 30 tablet 11  . gabapentin (NEURONTIN) 300 MG capsule Take 1 capsule (300 mg total) by mouth at bedtime. 90 capsule 1  . glucose blood (TRUE METRIX BLOOD GLUCOSE TEST) test strip 1 each by Other route 3 (three) times daily. Use as instructed 300 each 3  . ibuprofen (ADVIL,MOTRIN) 200 MG tablet Take 400 mg by mouth every 8 (eight) hours as needed for headache or moderate pain.     . Insulin Glargine (BASAGLAR KWIKPEN) 100 UNIT/ML SOPN Inject 0.3 mLs (30 Units total) into the skin at bedtime. 15 mL 11  . letrozole (FEMARA) 2.5 MG tablet Take 1 tablet (2.5 mg total) by mouth daily. 90 tablet 3  . lisinopril-hydrochlorothiazide (PRINZIDE,ZESTORETIC) 20-25 MG tablet Take 1 tablet by mouth daily. 90 tablet 2  . metFORMIN (GLUCOPHAGE) 1000  MG tablet Take 1 tablet (1,000 mg total) by mouth 2 (two) times daily with a meal. 180 tablet 3  . NON FORMULARY Peripheral Neuropathy cream from [place]    . Omega-3 Fatty Acids (FISH OIL) 1200 MG CAPS Take 1,200 mg by mouth daily.     . ondansetron (ZOFRAN-ODT) 4 MG disintegrating tablet Take 1 tablet (4 mg total) by mouth every 8 (eight) hours as needed for nausea or vomiting. 20 tablet 1  . Potassium Chloride ER 20 MEQ TBCR Take 40 mEq daily by mouth. 60 tablet 2  . Potassium Gluconate 2.5 MEQ TABS Take by mouth.    . torsemide (DEMADEX) 20 MG tablet Take 20 mg by mouth 2 (two) times daily as needed (swelling).     . triamcinolone cream (KENALOG) 0.1 % APPLY 1 APPLICATION TOPICALLY 2 (TWO) TIMES DAILY TO AFFECTED AREAS 80 g 0  . TRUEPLUS LANCETS 33G MISC 1 each by Does not apply route 3 (three) times daily. 600 each 2   No current facility-administered medications for this visit.    Allergies  Allergen Reactions  . Codeine Other (See Comments)    "get crazy", "see things that aren't there"    Family History  Problem Relation Age of Onset  . Prostate cancer Father   . Ovarian cancer Maternal Aunt  43       deceased  . Pancreatic cancer Mother 69       deceased  . Cancer Maternal Aunt        2 other mat aunts; unk. primary in 46s; deceased  . Cancer Maternal Uncle        "bone ca"; unk. primary; deceased 52s  . Prostate cancer Maternal Uncle        deceased 75  . Colon cancer Neg Hx   . Esophageal cancer Neg Hx   . Rectal cancer Neg Hx   . Stomach cancer Neg Hx     Social History   Socioeconomic History  . Marital status: Divorced    Spouse name: Not on file  . Number of children: Not on file  . Years of education: Not on file  . Highest education level: Not on file  Occupational History  . Not on file  Tobacco Use  . Smoking status: Former Smoker    Packs/day: 0.10    Years: 7.00    Pack years: 0.70    Quit date: 12/01/1975    Years since quitting:  44.3  . Smokeless tobacco: Never Used  Substance and Sexual Activity  . Alcohol use: No    Alcohol/week: 0.0 standard drinks  . Drug use: No  . Sexual activity: Yes  Other Topics Concern  . Not on file  Social History Narrative  . Not on file   Social Determinants of Health   Financial Resource Strain:   . Difficulty of Paying Living Expenses:   Food Insecurity:   . Worried About Charity fundraiser in the Last Year:   . Arboriculturist in the Last Year:   Transportation Needs:   . Film/video editor (Medical):   Marland Kitchen Lack of Transportation (Non-Medical):   Physical Activity:   . Days of Exercise per Week:   . Minutes of Exercise per Session:   Stress:   . Feeling of Stress :   Social Connections:   . Frequency of Communication with Friends and Family:   . Frequency of Social Gatherings with Friends and Family:   . Attends Religious Services:   . Active Member of Clubs or Organizations:   . Attends Archivist Meetings:   Marland Kitchen Marital Status:   Intimate Partner Violence:   . Fear of Current or Ex-Partner:   . Emotionally Abused:   Marland Kitchen Physically Abused:   . Sexually Abused:      Constitutional: Denies fever, malaise, fatigue, headache or abrupt weight changes.  Respiratory: Denies difficulty breathing, shortness of breath, cough or sputum production.   Cardiovascular: Denies chest pain, chest tightness, palpitations or swelling in the hands or feet.  Skin: Pt reports rash of bilateral elbows. Denies redness, lesions or ulcercations.    No other specific complaints in a complete review of systems (except as listed in HPI above).     Objective:   Physical Exam   BP 138/82   Pulse 83   Temp 98 F (36.7 C) (Temporal)   Wt 229 lb (103.9 kg)   SpO2 97%   BMI 34.82 kg/m   Wt Readings from Last 3 Encounters:  03/01/20 229 lb 8 oz (104.1 kg)  10/04/19 232 lb (105.2 kg)  01/03/19 229 lb (103.9 kg)    General: Appears her stated age,obesde, in  NAD. Skin: Warm, dry and intact. Patches of scaly maculopapular skin noted of bilateral fingers, bilateral elbows extending down to mid forearm. Cardiovascular:  Normal rate and rhythm. S1,S2 noted.  No murmur, rubs or gallops noted. Pulmonary/Chest: Normal effort and positive vesicular breath sounds. No respiratory distress. No wheezes, rales or ronchi noted.  Neurological: Alert and oriented.    BMET    Component Value Date/Time   NA 139 03/01/2020 1027   NA 141 09/21/2017 1108   K 4.1 03/01/2020 1027   K 4.1 09/21/2017 1108   CL 104 03/01/2020 1027   CL 104 12/12/2012 1031   CO2 23 03/01/2020 1027   CO2 25 09/21/2017 1108   GLUCOSE 157 (H) 03/01/2020 1027   GLUCOSE 132 09/21/2017 1108   GLUCOSE 130 (H) 12/12/2012 1031   BUN 18 03/01/2020 1027   BUN 19.7 09/21/2017 1108   CREATININE 0.96 03/01/2020 1027   CREATININE 1.1 09/21/2017 1108   CALCIUM 9.6 03/01/2020 1027   CALCIUM 8.8 09/21/2017 1108   GFRNONAA >60 03/01/2020 1027   GFRAA >60 03/01/2020 1027    Lipid Panel     Component Value Date/Time   CHOL 139 03/29/2019 0931   TRIG 154.0 (H) 03/29/2019 0931   HDL 43.00 03/29/2019 0931   CHOLHDL 3 03/29/2019 0931   VLDL 30.8 03/29/2019 0931   LDLCALC 66 03/29/2019 0931    CBC    Component Value Date/Time   WBC 7.8 03/01/2020 1027   WBC 7.4 12/20/2018 1219   RBC 3.67 (L) 03/01/2020 1027   HGB 12.2 03/01/2020 1027   HGB 10.0 (L) 10/11/2017 0858   HCT 37.4 03/01/2020 1027   HCT 29.6 (L) 10/11/2017 0858   PLT 322 03/01/2020 1027   PLT 336 10/11/2017 0858   MCV 101.9 (H) 03/01/2020 1027   MCV 101.4 (H) 10/11/2017 0858   MCH 33.2 03/01/2020 1027   MCHC 32.6 03/01/2020 1027   RDW 12.4 03/01/2020 1027   RDW 13.7 10/11/2017 0858   LYMPHSABS 2.5 03/01/2020 1027   LYMPHSABS 2.8 10/11/2017 0858   MONOABS 0.7 03/01/2020 1027   MONOABS 0.7 10/11/2017 0858   EOSABS 0.2 03/01/2020 1027   EOSABS 0.3 10/11/2017 0858   BASOSABS 0.1 03/01/2020 1027   BASOSABS 0.1  10/11/2017 0858    Hgb A1C Lab Results  Component Value Date   HGBA1C 10.4 (A) 10/04/2019           Assessment & Plan:  Eczema:  Avoid frequent washing with hot water Apply moisturizing lotion daily Refilled Triamcinolone cream BID Can refer to dermatology if no improvement in symptoms  Return precautions discussed  Webb Silversmith, NP This visit occurred during the SARS-CoV-2 public health emergency.  Safety protocols were in place, including screening questions prior to the visit, additional usage of staff PPE, and extensive cleaning of exam room while observing appropriate contact time as indicated for disinfecting solutions.

## 2020-03-13 NOTE — Telephone Encounter (Signed)
Pt had OV on 03/12/20.

## 2020-04-10 ENCOUNTER — Other Ambulatory Visit: Payer: Self-pay | Admitting: Internal Medicine

## 2020-04-15 ENCOUNTER — Ambulatory Visit (INDEPENDENT_AMBULATORY_CARE_PROVIDER_SITE_OTHER): Payer: Medicare HMO | Admitting: Internal Medicine

## 2020-04-15 ENCOUNTER — Other Ambulatory Visit: Payer: Self-pay

## 2020-04-15 ENCOUNTER — Encounter: Payer: Self-pay | Admitting: Internal Medicine

## 2020-04-15 VITALS — BP 138/74 | HR 80 | Temp 98.1°F | Ht 68.0 in | Wt 230.0 lb

## 2020-04-15 DIAGNOSIS — E7800 Pure hypercholesterolemia, unspecified: Secondary | ICD-10-CM

## 2020-04-15 DIAGNOSIS — M8949 Other hypertrophic osteoarthropathy, multiple sites: Secondary | ICD-10-CM | POA: Diagnosis not present

## 2020-04-15 DIAGNOSIS — C50112 Malignant neoplasm of central portion of left female breast: Secondary | ICD-10-CM | POA: Diagnosis not present

## 2020-04-15 DIAGNOSIS — E1165 Type 2 diabetes mellitus with hyperglycemia: Secondary | ICD-10-CM | POA: Diagnosis not present

## 2020-04-15 DIAGNOSIS — Z0000 Encounter for general adult medical examination without abnormal findings: Secondary | ICD-10-CM

## 2020-04-15 DIAGNOSIS — G620 Drug-induced polyneuropathy: Secondary | ICD-10-CM | POA: Diagnosis not present

## 2020-04-15 DIAGNOSIS — M159 Polyosteoarthritis, unspecified: Secondary | ICD-10-CM

## 2020-04-15 DIAGNOSIS — E559 Vitamin D deficiency, unspecified: Secondary | ICD-10-CM

## 2020-04-15 DIAGNOSIS — I10 Essential (primary) hypertension: Secondary | ICD-10-CM

## 2020-04-15 DIAGNOSIS — Z794 Long term (current) use of insulin: Secondary | ICD-10-CM | POA: Diagnosis not present

## 2020-04-15 DIAGNOSIS — M1990 Unspecified osteoarthritis, unspecified site: Secondary | ICD-10-CM | POA: Insufficient documentation

## 2020-04-15 LAB — COMPREHENSIVE METABOLIC PANEL
ALT: 26 U/L (ref 0–35)
AST: 24 U/L (ref 0–37)
Albumin: 4.2 g/dL (ref 3.5–5.2)
Alkaline Phosphatase: 71 U/L (ref 39–117)
BUN: 17 mg/dL (ref 6–23)
CO2: 26 mEq/L (ref 19–32)
Calcium: 9.9 mg/dL (ref 8.4–10.5)
Chloride: 100 mEq/L (ref 96–112)
Creatinine, Ser: 0.83 mg/dL (ref 0.40–1.20)
GFR: 82.5 mL/min (ref >60.00)
Glucose, Bld: 222 mg/dL — ABNORMAL HIGH (ref 70–99)
Potassium: 4.3 mEq/L (ref 3.5–5.1)
Sodium: 137 mEq/L (ref 135–145)
Total Bilirubin: 0.4 mg/dL (ref 0.2–1.2)
Total Protein: 7.3 g/dL (ref 6.0–8.3)

## 2020-04-15 LAB — CBC
HCT: 34.7 % — ABNORMAL LOW (ref 36.0–46.0)
Hemoglobin: 12.1 g/dL (ref 12.0–15.0)
MCHC: 35 g/dL (ref 30.0–36.0)
MCV: 100.5 fl — ABNORMAL HIGH (ref 78.0–100.0)
Platelets: 347 10*3/uL (ref 150.0–400.0)
RBC: 3.45 Mil/uL — ABNORMAL LOW (ref 3.87–5.11)
RDW: 13.3 % (ref 11.5–15.5)
WBC: 7.9 10*3/uL (ref 4.0–10.5)

## 2020-04-15 LAB — LIPID PANEL
Cholesterol: 195 mg/dL (ref 0–200)
HDL: 50.4 mg/dL (ref >39.00)
NonHDL: 144.96
Total CHOL/HDL Ratio: 4
Triglycerides: 243 mg/dL — ABNORMAL HIGH (ref 0.0–149.0)
VLDL: 48.6 mg/dL — ABNORMAL HIGH (ref 0.0–40.0)

## 2020-04-15 LAB — HEMOGLOBIN A1C: Hgb A1c MFr Bld: 8.4 % — ABNORMAL HIGH (ref 4.6–6.5)

## 2020-04-15 LAB — LDL CHOLESTEROL, DIRECT: Direct LDL: 113 mg/dL

## 2020-04-15 NOTE — Progress Notes (Signed)
HPI:  Pt presents to the clinic today for her annual subsequent Medicare Wellness Exam. She is also due to follow up chronic conditions.  History of Breast Cancer: 2002, reoccurrence in 2017, currently in remission. 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 She is taking Femara as prescribed. She follows with Dr. Burr Medico.  DM 2: Her last A1C was 10.4, 03/2020. She is taking Metformin, [place], and Engineer, agricultural as prescribed. She has not been checking her sugars. She checks her feet routinely. She does have neuropathic pain, controlled on Gabapentin. Her last eye exam was within the last year. She follows with Dr. Cruzita Lederer.  HTN: Her BP today is 138/74. She is taking Lisinopril HCT as prescribed. ECG from 07/2017 reviewed.  HLD: Her last LDL was 66, triglycerides 154, 03/2019. She is taking Atorvastatin and Fish Oilas prescribed. She denies myalgias. She does not consume a low fat diet.   Arthritis: Mainly in her hands and knees. She is takingTylenol and Gabapentin as prescribed with some relief.  Past Medical History:  Diagnosis Date  . Blood transfusion without reported diagnosis   . Breast cancer [facility]) August 2002   Invasive ductal carcinoma Left breast. 2002, 2017  . Diabetes mellitus without complication ([place])   . Family history of malignant neoplasm of ovary   . Family history of pancreatic cancer   . Hypertension     Current Outpatient Medications  Medication Sig Dispense Refill  . aspirin 81 MG tablet Take 81 mg by mouth daily as needed (leg pain).     Marland Kitchen atorvastatin (LIPITOR) 10 MG tablet Take 0.5 tablets (5 mg total) by mouth daily. 45 tablet 0  . BD PEN NEEDLE NANO 2ND GEN 32G X 4 MM MISC USE 1 PEN NEEDLE 2 (TWO) TIMES DAILY. 200 each 0  . dapagliflozin propanediol (FARXIGA) 10 MG TABS tablet Take 10 mg by mouth daily. 30 tablet 11  . gabapentin (NEURONTIN) 300 MG capsule Take 1 capsule (300 mg total) by mouth at bedtime. 90 capsule 1  . glucose blood (TRUE  METRIX BLOOD GLUCOSE TEST) test strip 1 each by Other route 3 (three) times daily. Use as instructed 300 each 3  . ibuprofen (ADVIL,MOTRIN) 200 MG tablet Take 400 mg by mouth every 8 (eight) hours as needed for headache or moderate pain.     . Insulin Glargine (BASAGLAR KWIKPEN) 100 UNIT/ML SOPN Inject 0.3 mLs (30 Units total) into the skin at bedtime. 15 mL 11  . letrozole (FEMARA) 2.5 MG tablet Take 1 tablet (2.5 mg total) by mouth daily. 90 tablet 3  . lisinopril-hydrochlorothiazide (ZESTORETIC) 20-25 MG tablet Take 1 tablet by mouth once daily 90 tablet 0  . metFORMIN (GLUCOPHAGE) 1000 MG tablet Take 1 tablet (1,000 mg total) by mouth 2 (two) times daily with a meal. 180 tablet 3  . NON FORMULARY Peripheral Neuropathy cream from [place]    . Omega-3 Fatty Acids (FISH OIL) 1200 MG CAPS Take 1,200 mg by mouth daily.     . ondansetron (ZOFRAN-ODT) 4 MG disintegrating tablet Take 1 tablet (4 mg total) by mouth every 8 (eight) hours as needed for nausea or vomiting. 20 tablet 1  . Potassium Chloride ER 20 MEQ TBCR Take 40 mEq daily by mouth. 60 tablet 2  . Potassium Gluconate 2.5 MEQ TABS Take by mouth.    . torsemide (DEMADEX) 20 MG tablet Take 20 mg by mouth 2 (two) times daily as needed (swelling).     . triamcinolone cream (  KENALOG) 0.1 % APPLY 1 APPLICATION TOPICALLY 2 (TWO) TIMES DAILY TO AFFECTED AREAS 80 g 0  . TRUEPLUS LANCETS 33G MISC 1 each by Does not apply route 3 (three) times daily. 600 each 2   No current facility-administered medications for this visit.    Allergies  Allergen Reactions  . Codeine Other (See Comments)    "get crazy", "see things that aren't there"    Family History  Problem Relation Age of Onset  . Prostate cancer Father   . Ovarian cancer Maternal Aunt 55       deceased  . Pancreatic cancer Mother 27       deceased  . Cancer Maternal Aunt        2 other mat aunts; unk. primary in 34s; deceased  . Cancer Maternal Uncle        "bone ca";  unk. primary; deceased 56s  . Prostate cancer Maternal Uncle        deceased 59  . Colon cancer Neg Hx   . Esophageal cancer Neg Hx   . Rectal cancer Neg Hx   . Stomach cancer Neg Hx     Social History   Socioeconomic History  . Marital status: Divorced    Spouse name: Not on file  . Number of children: Not on file  . Years of education: Not on file  . Highest education level: Not on file  Occupational History  . Not on file  Tobacco Use  . Smoking status: Former Smoker    Packs/day: 0.10    Years: 7.00    Pack years: 0.70    Quit date: 12/01/1975    Years since quitting: 44.4  . Smokeless tobacco: Never Used  Substance and Sexual Activity  . Alcohol use: No    Alcohol/week: 0.0 standard drinks  . Drug use: No  . Sexual activity: Yes  Other Topics Concern  . Not on file  Social History Narrative  . Not on file   Social Determinants of Health   Financial Resource Strain:   . Difficulty of Paying Living Expenses:   Food Insecurity:   . Worried About Charity fundraiser in the Last Year:   . Arboriculturist in the Last Year:   Transportation Needs:   . Film/video editor (Medical):   Marland Kitchen Lack of Transportation (Non-Medical):   Physical Activity:   . Days of Exercise per Week:   . Minutes of Exercise per Session:   Stress:   . Feeling of Stress :   Social Connections:   . Frequency of Communication with Friends and Family:   . Frequency of Social Gatherings with Friends and Family:   . Attends Religious Services:   . Active Member of Clubs or Organizations:   . Attends Archivist Meetings:   Marland Kitchen Marital Status:   Intimate Partner Violence:   . Fear of Current or Ex-Partner:   . Emotionally Abused:   Marland Kitchen Physically Abused:   . Sexually Abused:     Hospitiliaztions: None  Health Maintenance:    Flu: 08/2019  Tetanus: 08/2012  Pneumovax: 01/2016  Prevnar: 09/2017  Zostavax: 08/2012  Shingrix: never  Covid: 01/2020, 01/2020  Mammogram:  05/2019  Pap Smear: 01/2016  Bone Density: 05/2019  Colon Screening: 08/2019  Eye Doctor: annually  Dental Exam: annually   Providers:   PCP: Webb Silversmith, NP  Oncologist: Dr. Burr Medico  Endocrinologist: Dr. Cruzita Lederer   I have personally reviewed and have noted:  1.  The patient's medical and social history 2. Their use of alcohol, tobacco or illicit drugs 3. Their current medications and supplements 4. The patient's functional ability including ADL's, fall risks, home safety risks and  hearing or visual impairment. 5. Diet and physical activities 6. Evidence for depression or mood disorder  Subjective:   Review of Systems:   Constitutional: Denies fever, malaise, fatigue, headache or abrupt weight changes.  HEENT: Denies eye pain, eye redness, ear pain, ringing in the ears, wax buildup, runny nose, nasal congestion, bloody nose, or sore throat. Respiratory: Denies difficulty breathing, shortness of breath, cough or sputum production.   Cardiovascular: Denies chest pain, chest tightness, palpitations or swelling in the hands or feet.  Gastrointestinal: Denies abdominal pain, bloating, constipation, diarrhea or blood in the stool.  GU: Denies urgency, frequency, pain with urination, burning sensation, blood in urine, odor or discharge. Musculoskeletal: Pt reports intermittent joint pain. Denies decrease in range of motion, difficulty with gait, muscle pain or joint swelling.  Skin: Pt reports discoloration of BLE. Denies redness, rashes, lesions or ulcercations.  Neurological: Pt reports neuropathic pain in feet. Denies dizziness, difficulty with memory, difficulty with speech or problems with balance and coordination.  Psych: Denies anxiety, depression, SI/HI.  No other specific complaints in a complete review of systems (except as listed in HPI above).  Objective:  PE:   BP 138/74   Pulse 80   Temp 98.1 F (36.7 C) (Temporal)   Ht 5\' 8"  (1.727 m)   Wt 230 lb (104.3 kg)   SpO2  98%   BMI 34.97 kg/m   Wt Readings from Last 3 Encounters:  03/12/20 229 lb (103.9 kg)  03/01/20 229 lb 8 oz (104.1 kg)  10/04/19 232 lb (105.2 kg)    General: Appears her stated age, obese, in NAD. Skin: Warm, dry and intact. Vascular changes noted BLE, but no ulcerations. HEENT: Head: normal shape and size; Eyes: sclera white, no icterus, conjunctiva pink, PERRLA and EOMs intact;  Neck: Neck supple, trachea midline. No masses, lumps or thyromegaly present.  Cardiovascular: Normal rate and rhythm. S1,S2 noted.  No murmur, rubs or gallops noted. No JVD or BLE edema. No carotid bruits noted. Pulmonary/Chest: Normal effort and positive vesicular breath sounds. No respiratory distress. No wheezes, rales or ronchi noted.  Abdomen: Soft and nontender. Normal bowel sounds. No distention or masses noted. Liver, spleen and kidneys non palpable. Musculoskeletal:  Strength 5/5 BUE/BLE. No difficulty with gait. Neurological: Alert and oriented. Cranial nerves II-XII grossly intact. Coordination normal. Sensation decreased to BLE. Psychiatric: Mood and affect normal. Behavior is normal. Judgment and thought content normal.     BMET    Component Value Date/Time   NA 139 03/01/2020 1027   NA 141 09/21/2017 1108   K 4.1 03/01/2020 1027   K 4.1 09/21/2017 1108   CL 104 03/01/2020 1027   CL 104 12/12/2012 1031   CO2 23 03/01/2020 1027   CO2 25 09/21/2017 1108   GLUCOSE 157 (H) 03/01/2020 1027   GLUCOSE 132 09/21/2017 1108   GLUCOSE 130 (H) 12/12/2012 1031   BUN 18 03/01/2020 1027   BUN 19.7 09/21/2017 1108   CREATININE 0.96 03/01/2020 1027   CREATININE 1.1 09/21/2017 1108   CALCIUM 9.6 03/01/2020 1027   CALCIUM 8.8 09/21/2017 1108   GFRNONAA >60 03/01/2020 1027   GFRAA >60 03/01/2020 1027    Lipid Panel     Component Value Date/Time   CHOL 139 03/29/2019 0931   TRIG 154.0 (H) 03/29/2019  0931   HDL 43.00 03/29/2019 0931   CHOLHDL 3 03/29/2019 0931   VLDL 30.8 03/29/2019 0931    LDLCALC 66 03/29/2019 0931    CBC    Component Value Date/Time   WBC 7.8 03/01/2020 1027   WBC 7.4 12/20/2018 1219   RBC 3.67 (L) 03/01/2020 1027   HGB 12.2 03/01/2020 1027   HGB 10.0 (L) 10/11/2017 0858   HCT 37.4 03/01/2020 1027   HCT 29.6 (L) 10/11/2017 0858   PLT 322 03/01/2020 1027   PLT 336 10/11/2017 0858   MCV 101.9 (H) 03/01/2020 1027   MCV 101.4 (H) 10/11/2017 0858   MCH 33.2 03/01/2020 1027   MCHC 32.6 03/01/2020 1027   RDW 12.4 03/01/2020 1027   RDW 13.7 10/11/2017 0858   LYMPHSABS 2.5 03/01/2020 1027   LYMPHSABS 2.8 10/11/2017 0858   MONOABS 0.7 03/01/2020 1027   MONOABS 0.7 10/11/2017 0858   EOSABS 0.2 03/01/2020 1027   EOSABS 0.3 10/11/2017 0858   BASOSABS 0.1 03/01/2020 1027   BASOSABS 0.1 10/11/2017 0858    Hgb A1C Lab Results  Component Value Date   HGBA1C 10.4 (A) 10/04/2019      Assessment and Plan:   Medicare Annual Wellness Visit:  Diet: She does eat meat. She consumes fruits and veggies daily. She does eat fried foods. She drinks some water. Physical activity: Walking Depression/mood screen: Negative, PHQ 9 score of 0 Hearing: Intact to whispered voice Visual acuity: Grossly normal, performs annual eye exam  ADLs: Capable Fall risk: None Home safety: Good Cognitive evaluation: Intact to orientation, naming, recall and repetition EOL planning: No adv directives, full code/ I agree  Preventative Medicine: Encouraged her to get a flu shot in the fall. Tetanus, pneuomvax, prevnar, zostovax and covid UTD. She declines shingrix at this time. Mammogram scheduled in 07/2019. Pap smear due 2022. Bone density and colon screening UTD. Encouraged her to consume a balanced diet and exercise regimen. Advised her to see an eye doctor and dentist annually. Will check CBC, CMET, Lipid, A1C and Vit D today.   Next appointment: 6 months, follow up chronic conditions   Webb Silversmith, NP This visit occurred during the SARS-CoV-2 public health emergency.   Safety protocols were in place, including screening questions prior to the visit, additional usage of staff PPE, and extensive cleaning of exam room while observing appropriate contact time as indicated for disinfecting solutions.

## 2020-04-15 NOTE — Assessment & Plan Note (Signed)
Controlled on Lisinopril HCT CMET today

## 2020-04-15 NOTE — Assessment & Plan Note (Signed)
Noncompliant with diet, had long discussion about diabetes diet She declines referral to diabetes education and nutrition at this time A1C today No urine microalbumin due to ACEI therapy Continue meds per endocrinology- has follow up next month Encouraged her to consume a low carb diet, exercise for weight loss Foot exam today Will request copy of eye exam Immunizations UTD

## 2020-04-15 NOTE — Assessment & Plan Note (Signed)
In remission Continue Femara Continue to follow with Dr. Burr Medico Will follow mammograms

## 2020-04-15 NOTE — Assessment & Plan Note (Signed)
CMET and Lipid profile today Encouraged her to consume a low fat diet Continue Atorvastatin and Fish Oil

## 2020-04-15 NOTE — Assessment & Plan Note (Signed)
Continue Gabapentin

## 2020-04-15 NOTE — Patient Instructions (Signed)

## 2020-04-15 NOTE — Assessment & Plan Note (Signed)
Continue Tylenol and Gabapentin for now

## 2020-04-16 LAB — VITAMIN D 25 HYDROXY (VIT D DEFICIENCY, FRACTURES): VITD: 25.2 ng/mL — ABNORMAL LOW (ref 30.00–100.00)

## 2020-04-19 ENCOUNTER — Other Ambulatory Visit: Payer: Self-pay

## 2020-04-23 ENCOUNTER — Other Ambulatory Visit: Payer: Self-pay

## 2020-04-23 ENCOUNTER — Encounter: Payer: Self-pay | Admitting: Internal Medicine

## 2020-04-23 ENCOUNTER — Ambulatory Visit (INDEPENDENT_AMBULATORY_CARE_PROVIDER_SITE_OTHER): Payer: Medicare HMO | Admitting: Internal Medicine

## 2020-04-23 VITALS — BP 138/70 | HR 105 | Ht 68.0 in | Wt 224.4 lb

## 2020-04-23 DIAGNOSIS — Z794 Long term (current) use of insulin: Secondary | ICD-10-CM

## 2020-04-23 DIAGNOSIS — E669 Obesity, unspecified: Secondary | ICD-10-CM

## 2020-04-23 DIAGNOSIS — E1165 Type 2 diabetes mellitus with hyperglycemia: Secondary | ICD-10-CM

## 2020-04-23 DIAGNOSIS — E785 Hyperlipidemia, unspecified: Secondary | ICD-10-CM

## 2020-04-23 MED ORDER — ATORVASTATIN CALCIUM 10 MG PO TABS: 5.0000 mg | ORAL_TABLET | Freq: Every day | ORAL | 3 refills | Status: DC

## 2020-04-23 NOTE — Progress Notes (Signed)
Patient ID: Lindsey Marquez, female   DOB: 01-Sep-1951, 69 y.o.   MRN: [id number]   This visit occurred during the SARS-CoV-2 public health emergency.  Safety protocols were in place, including screening questions prior to the visit, additional usage of staff PPE, and extensive cleaning of exam room while observing appropriate contact time as indicated for disinfecting solutions.   HPI: Lindsey Marquez is a 69 y.o.-year-old female, returning for follow-up for DM2, dx in ~2001, insulin-dependent, uncontrolled, without long-term complications. Last visit 3 months ago (virtual).  Since last visit, she started to work on her diet and be diligent about checking blood sugars.  Sugars improved significantly.  Reviewed HbA1c levels: Lab Results  Component Value Date   HGBA1C 8.4 (H) 04/15/2020   HGBA1C 10.4 (A) 10/04/2019   HGBA1C 9.8 (H) 12/20/2018   Pt is on: - Basaglar 20 >> 30 units at bedtime - Metformin 1000 mg 2x a day with meals - Farxiga 10 mg daily before b'fast GLP1 R agonists were all high price.  Pt checks her sugars once a day: - am: 117-130 >> 190, 243, 318 >> 199-240 >> 197 >> 108-151, 164, 186 - 2h after b'fast: n/c - before lunch: n/c >> 329 >> 175-200 >> 205 >> n/c - 2h after lunch: n/c >> 344 >> n/c - before dinner: n/c >> 176, 226 >> 180-195 >> 200s >> 103-145, 174 - 2h after dinner: n/c >> 171-190 >> n/c >> 120-160, 186, 192 - bedtime:  100, 117, 160-170 >> 244, 266 >> 178-200 >> n/c  - nighttime: n/c Lowest sugar was 176 >> 175 >> 197 >> 108; she has hypoglycemia awareness in the 70s. Highest sugar was 344 >> 240 >> 300 (bubble gum) >> 192.  Glucometer: True metrix (we cannot download this)  Pt's meals are: - Breakfast:coffee, mc muffin, egg - Lunch: [place] sandwich - Dinner: baked chicken, veggies,dessert - apples, icecream - Snacks: yoghurt, pretzels, PB  -No CKD, last BUN/creatinine:  Lab Results  Component Value Date   BUN 17 04/15/2020   BUN 18  03/01/2020   CREATININE 0.83 04/15/2020   CREATININE 0.96 03/01/2020  On lisinopril 10. -+ HL; last set of lipids: Lab Results  Component Value Date   CHOL 195 04/15/2020   HDL 50.40 04/15/2020   LDLCALC 66 03/29/2019   LDLDIRECT 113.0 04/15/2020   TRIG 243.0 (H) 04/15/2020   CHOLHDL 4 04/15/2020  Off Lipitor 5 mg daily, but continues fish oil - last eye exam was 05/2019: No DR  -She denies numbness and tingling in her feet.  On ASA 81.  Pt has FH of DM in brother, sister, father.  + h/o BrCA in 2013, recurrence in 2018.  She is cancer free.    ROS: Constitutional: no weight gain/+ weight loss, no fatigue, no subjective hyperthermia, no subjective hypothermia Eyes: no blurry vision, no xerophthalmia ENT: no sore throat, no nodules palpated in neck, no dysphagia, no odynophagia, no hoarseness Cardiovascular: no CP/no SOB/no palpitations/no leg swelling Respiratory: no cough/no SOB/no wheezing Gastrointestinal: no N/no V/no D/no C/no acid reflux Musculoskeletal: no muscle aches/no joint aches Skin: no rashes, no hair loss Neurological: no tremors/no numbness/no tingling/no dizziness  I reviewed pt's medications, allergies, PMH, social hx, family hx, and changes were documented in the history of present illness. Otherwise, unchanged from my initial visit note.  Current Outpatient Medications on File Prior to Visit  Medication Sig  . aspirin 81 MG tablet Take 81 mg by mouth daily as needed (leg  pain).   . atorvastatin (LIPITOR) 10 MG tablet Take 0.5 tablets (5 mg total) by mouth daily.  . BD PEN NEEDLE NANO 2ND GEN 32G X 4 MM MISC USE 1 PEN NEEDLE 2 (TWO) TIMES DAILY.  . dapagliflozin propanediol (FARXIGA) 10 MG TABS tablet Take 10 mg by mouth daily.  Marland Kitchen gabapentin (NEURONTIN) 300 MG capsule Take 1 capsule (300 mg total) by mouth at bedtime.  Marland Kitchen glucose blood (TRUE METRIX BLOOD GLUCOSE TEST) test strip 1 each by Other route 3 (three) times daily. Use as instructed  . ibuprofen  (ADVIL,MOTRIN) 200 MG tablet Take 400 mg by mouth every 8 (eight) hours as needed for headache or moderate pain.   . Insulin Glargine (BASAGLAR KWIKPEN) 100 UNIT/ML SOPN Inject 0.3 mLs (30 Units total) into the skin at bedtime.  Marland Kitchen letrozole (FEMARA) 2.5 MG tablet Take 1 tablet (2.5 mg total) by mouth daily.  Marland Kitchen lisinopril-hydrochlorothiazide (ZESTORETIC) 20-25 MG tablet Take 1 tablet by mouth once daily  . metFORMIN (GLUCOPHAGE) 1000 MG tablet Take 1 tablet (1,000 mg total) by mouth 2 (two) times daily with a meal.  . NON FORMULARY Peripheral Neuropathy cream from [place]  . Omega-3 Fatty Acids (FISH OIL) 1200 MG CAPS Take 1,200 mg by mouth daily.   . ondansetron (ZOFRAN-ODT) 4 MG disintegrating tablet Take 1 tablet (4 mg total) by mouth every 8 (eight) hours as needed for nausea or vomiting.  . triamcinolone cream (KENALOG) 0.1 % APPLY 1 APPLICATION TOPICALLY 2 (TWO) TIMES DAILY TO AFFECTED AREAS  . TRUEPLUS LANCETS 33G MISC 1 each by Does not apply route 3 (three) times daily.   No current facility-administered medications on file prior to visit.   Past Medical History:  Diagnosis Date  . Blood transfusion without reported diagnosis   . Breast cancer [facility]) August 2002   Invasive ductal carcinoma Left breast. 2002, 2017  . Diabetes mellitus without complication ([place])   . Family history of malignant neoplasm of ovary   . Family history of pancreatic cancer   . Hypertension    Past Surgical History:  Procedure Laterality Date  . BREAST SURGERY Left 2003  . COLONOSCOPY    . FOOT SURGERY  2000  . INSERTION OF MESH N/A 02/17/2018   Procedure: INSERTION OF MESH;  Surgeon: [name], MD;  Location: [place];  Service: General;  Laterality: N/A;  . MASTECTOMY Left 05/28/2016  . port a cath insertion  05/28/2016  . PORT-A-CATH REMOVAL Right 08/19/2017   Procedure: REMOVAL PORT-A-CATH;  Surgeon: [name], MD;  Location: [place];  Service: General;   Laterality: Right;  . PORTACATH PLACEMENT Right 05/28/2016   Procedure: INSERTION PORT-A-CATH;  Surgeon: [name], MD;  Location: [place];  Service: General;  Laterality: Right;  . TOTAL MASTECTOMY Left 05/28/2016   Procedure: LEFT  MASTECTOMY;  Surgeon: [name], MD;  Location: [place];  Service: General;  Laterality: Left;  . TUBAL LIGATION  22 yrs since  2004.   Bilateral.  . VENTRAL HERNIA REPAIR  02/17/2018   open  . VENTRAL HERNIA REPAIR N/A 02/17/2018   Procedure: OPEN VENTRAL HERNIA REPAIR WITH MESH;  Surgeon: [name], MD;  Location: [place];  Service: General;  Laterality: N/A;   Social History   Socioeconomic History  . Marital status: Divorced    Spouse name: Not on file  . Number of children: Not on file  . Years of education: Not on file  . Highest education level: Not on file  Occupational History  . Not on file  Tobacco Use  . Smoking status: Former Smoker    Packs/day: 0.10    Years: 7.00    Pack years: 0.70    Quit date: 12/01/1975    Years since quitting: 44.4  . Smokeless tobacco: Never Used  Substance and Sexual Activity  . Alcohol use: No    Alcohol/week: 0.0 standard drinks  . Drug use: No  . Sexual activity: Yes  Other Topics Concern  . Not on file  Social History Narrative  . Not on file   Social Determinants of Health   Financial Resource Strain:   . Difficulty of Paying Living Expenses:   Food Insecurity:   . Worried About Charity fundraiser in the Last Year:   . Arboriculturist in the Last Year:   Transportation Needs:   . Film/video editor (Medical):   Marland Kitchen Lack of Transportation (Non-Medical):   Physical Activity:   . Days of Exercise per Week:   . Minutes of Exercise per Session:   Stress:   . Feeling of Stress :   Social Connections:   . Frequency of Communication with Friends and Family:   . Frequency of Social Gatherings with Friends and Family:   . Attends Religious Services:   . Active Member of Clubs or  Organizations:   . Attends Archivist Meetings:   Marland Kitchen Marital Status:   Intimate Partner Violence:   . Fear of Current or Ex-Partner:   . Emotionally Abused:   Marland Kitchen Physically Abused:   . Sexually Abused:     Allergies  Allergen Reactions  . Codeine Other (See Comments)    "get crazy", "see things that aren't there"   Family History  Problem Relation Age of Onset  . Prostate cancer Father   . Ovarian cancer Maternal Aunt 68       deceased  . Pancreatic cancer Mother 29       deceased  . Cancer Maternal Aunt        2 other mat aunts; unk. primary in 106s; deceased  . Cancer Maternal Uncle        "bone ca"; unk. primary; deceased 46s  . Prostate cancer Maternal Uncle        deceased 24  . Colon cancer Neg Hx   . Esophageal cancer Neg Hx   . Rectal cancer Neg Hx   . Stomach cancer Neg Hx     PE:  BP 138/70 (BP Location: Left Arm, Patient Position: Sitting, Cuff Size: Large)   Pulse (!) 105   Ht 5' 8"  (1.727 m)   Wt 224 lb 6.1 oz (101.8 kg)   SpO2 99%   BMI 34.12 kg/m  Wt Readings from Last 3 Encounters:  04/23/20 224 lb 6.1 oz (101.8 kg)  04/15/20 230 lb (104.3 kg)  03/12/20 229 lb (103.9 kg)   Constitutional: overweight, in NAD Eyes: PERRLA, EOMI, no exophthalmos ENT: moist mucous membranes, no thyromegaly, no cervical lymphadenopathy Cardiovascular: RRR, No MRG Respiratory: CTA B Gastrointestinal: abdomen soft, NT, ND, BS+ Musculoskeletal: no deformities, strength intact in all 4 Skin: moist, warm, no rashes Neurological: no tremor with outstretched hands, DTR normal in all 4  ASSESSMENT: 1. DM2, insulin-dependent, uncontrolled, without long-term complications, but with hyperglycemia  2. HL  3.  Obesity class II  PLAN:  1. Patient with longstanding, uncontrolled, type 2 diabetes, on oral diabetic regimen with Metformin and SGLT2 inhibitor and also basal insulin.  In  the past, she was on Basaglar but her sugars improved and she was able to come off  insulin.  We restarted this last year.  Januvia was expensive for her so we stopped it and continued Metformin and Farxiga, which is also expensive.  I did advise her to check with her insurance to see if GLP-1 receptor agonist were covered but these were too expensive.  At last visit, she was not checking sugars consistently and I advised her to start doing so.  Since whenever she checked, CBGs were 200, we increase her Basaglar dose. -Since last visit, she had another HbA1c approximately a week ago and this is better, at 8.4%, decreased by 2 percentages. -At this visit, sugars improved significantly and continued to improve in the last few weeks.  She occasionally has blood sugars above goal, but most of them are within target range.  No low blood sugars.  I congratulated her for the changes that she made and encouraged her to continue to check her blood sugars as advised and take the medications daily.  I do not feel that we need to change her regimen for now. - I suggested to: Patient Instructions  Please continue: - Basaglar 30 units at bedtime - Metformin 1000 mg 2x a day with meals - Farxiga 10 mg daily before b'fast  Please come back for a follow-up appointment in 3-4 months.  - advised to check sugars at different times of the day - 1x a day, rotating check times - advised for yearly eye exams >> she is UTD - return to clinic in 3-4 months  2. HL -Reviewed latest lipid panel from earlier this month: LDL much higher, now above target, triglycerides also high: Lab Results  Component Value Date   CHOL 195 04/15/2020   HDL 50.40 04/15/2020   LDLCALC 66 03/29/2019   LDLDIRECT 113.0 04/15/2020   TRIG 243.0 (H) 04/15/2020   CHOLHDL 4 04/15/2020  -Upon questioning, she was off Lipitor for the above lipid panel.  I sent a new prescription for 5 mg of Lipitor to her pharmacy.  She continues on fish oil.  No side effects from the medication.  3.  Obesity class II -Continue SGLT 2 inhibitor  which should also help with weight loss -lost 6 lbs since last visit  Philemon Kingdom, MD PhD [facility] Endocrinology

## 2020-04-23 NOTE — Patient Instructions (Signed)
Please continue: - Basaglar 30 units at bedtime - Metformin 1000 mg 2x a day with meals - Farxiga 10 mg daily before b'fast  KEEP UP THE GOOD JOB!  Please come back for a follow-up appointment in 3-4 months.

## 2020-04-24 ENCOUNTER — Ambulatory Visit: Payer: Medicare HMO

## 2020-04-24 ENCOUNTER — Encounter: Payer: Self-pay | Admitting: Genetic Counselor

## 2020-04-24 NOTE — Progress Notes (Signed)
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

## 2020-04-25 ENCOUNTER — Encounter: Payer: Self-pay | Admitting: Internal Medicine

## 2020-05-08 ENCOUNTER — Other Ambulatory Visit: Payer: Self-pay

## 2020-05-08 ENCOUNTER — Ambulatory Visit: Payer: Medicare HMO | Admitting: Podiatry

## 2020-05-08 ENCOUNTER — Encounter: Payer: Self-pay | Admitting: Podiatry

## 2020-05-08 DIAGNOSIS — Q828 Other specified congenital malformations of skin: Secondary | ICD-10-CM | POA: Diagnosis not present

## 2020-05-08 DIAGNOSIS — M79675 Pain in left toe(s): Secondary | ICD-10-CM

## 2020-05-08 DIAGNOSIS — M2042 Other hammer toe(s) (acquired), left foot: Secondary | ICD-10-CM

## 2020-05-08 DIAGNOSIS — E1165 Type 2 diabetes mellitus with hyperglycemia: Secondary | ICD-10-CM

## 2020-05-08 DIAGNOSIS — Z794 Long term (current) use of insulin: Secondary | ICD-10-CM | POA: Diagnosis not present

## 2020-05-08 DIAGNOSIS — M79674 Pain in right toe(s): Secondary | ICD-10-CM | POA: Diagnosis not present

## 2020-05-08 DIAGNOSIS — B351 Tinea unguium: Secondary | ICD-10-CM | POA: Diagnosis not present

## 2020-05-08 DIAGNOSIS — M2041 Other hammer toe(s) (acquired), right foot: Secondary | ICD-10-CM

## 2020-05-08 NOTE — Patient Instructions (Signed)
Diabetes Mellitus and Foot Care Foot care is an important part of your health, especially when you have diabetes. Diabetes may cause you to have problems because of poor blood flow (circulation) to your feet and legs, which can cause your skin to:  Become thinner and drier.  Break more easily.  Heal more slowly.  Peel and crack. You may also have nerve damage (neuropathy) in your legs and feet, causing decreased feeling in them. This means that you may not notice minor injuries to your feet that could lead to more serious problems. Noticing and addressing any potential problems early is the best way to prevent future foot problems. How to care for your feet Foot hygiene  Wash your feet daily with warm water and mild soap. Do not use hot water. Then, pat your feet and the areas between your toes until they are completely dry. Do not soak your feet as this can dry your skin.  Trim your toenails straight across. Do not dig under them or around the cuticle. File the edges of your nails with an emery board or nail file.  Apply a moisturizing lotion or petroleum jelly to the skin on your feet and to dry, brittle toenails. Use lotion that does not contain alcohol and is unscented. Do not apply lotion between your toes. Shoes and socks  Wear clean socks or stockings every day. Make sure they are not too tight. Do not wear knee-high stockings since they may decrease blood flow to your legs.  Wear shoes that fit properly and have enough cushioning. Always look in your shoes before you put them on to be sure there are no objects inside.  To break in new shoes, wear them for just a few hours a day. This prevents injuries on your feet. Wounds, scrapes, corns, and calluses  Check your feet daily for blisters, cuts, bruises, sores, and redness. If you cannot see the bottom of your feet, use a mirror or ask someone for help.  Do not cut corns or calluses or try to remove them with medicine.  If you  find a minor scrape, cut, or break in the skin on your feet, keep it and the skin around it clean and dry. You may clean these areas with mild soap and water. Do not clean the area with peroxide, alcohol, or iodine.  If you have a wound, scrape, corn, or callus on your foot, look at it several times a day to make sure it is healing and not infected. Check for: ? Redness, swelling, or pain. ? Fluid or blood. ? Warmth. ? Pus or a bad smell. General instructions  Do not cross your legs. This may decrease blood flow to your feet.  Do not use heating pads or hot water bottles on your feet. They may burn your skin. If you have lost feeling in your feet or legs, you may not know this is happening until it is too late.  Protect your feet from hot and cold by wearing shoes, such as at the beach or on hot pavement.  Schedule a complete foot exam at least once a year (annually) or more often if you have foot problems. If you have foot problems, report any cuts, sores, or bruises to your health care provider immediately. Contact a health care provider if:  You have a medical condition that increases your risk of infection and you have any cuts, sores, or bruises on your feet.  You have an injury that is not   healing.  You have redness on your legs or feet.  You feel burning or tingling in your legs or feet.  You have pain or cramps in your legs and feet.  Your legs or feet are numb.  Your feet always feel cold.  You have pain around a toenail. Get help right away if:  You have a wound, scrape, corn, or callus on your foot and: ? You have pain, swelling, or redness that gets worse. ? You have fluid or blood coming from the wound, scrape, corn, or callus. ? Your wound, scrape, corn, or callus feels warm to the touch. ? You have pus or a bad smell coming from the wound, scrape, corn, or callus. ? You have a fever. ? You have a red line going up your leg. Summary  Check your feet every day  for cuts, sores, red spots, swelling, and blisters.  Moisturize feet and legs daily.  Wear shoes that fit properly and have enough cushioning.  If you have foot problems, report any cuts, sores, or bruises to your health care provider immediately.  Schedule a complete foot exam at least once a year (annually) or more often if you have foot problems. This information is not intended to replace advice given to you by your health care provider. Make sure you discuss any questions you have with your health care provider. Document Revised: 08/09/2019 Document Reviewed: 12/18/2016 Elsevier Patient Education  2020 Elsevier Inc.  

## 2020-05-10 NOTE — Progress Notes (Signed)
Subjective: Lindsey Marquez presents today preventative diabetic foot care and painful porokeratotic lesion(s) b/l and painful mycotic toenails b/l that limit ambulation. Aggravating factors include weightbearing with and without shoe gear. Pain for both is relieved with periodic professional debridement.  Lindsey Fenton, NP is patient's PCP. Last visit was: 04/15/2020.  Past Medical History:  Diagnosis Date  . Blood transfusion without reported diagnosis   . Breast cancer [facility]) August 2002   Invasive ductal carcinoma Left breast. 2002, 2017  . Diabetes mellitus without complication ([place])   . Family history of malignant neoplasm of ovary   . Family history of pancreatic cancer   . Hypertension      Current Outpatient Medications on File Prior to Visit  Medication Sig Dispense Refill  . aspirin 81 MG tablet Take 81 mg by mouth daily as needed (leg pain).     Marland Kitchen atorvastatin (LIPITOR) 10 MG tablet Take 0.5 tablets (5 mg total) by mouth daily. 45 tablet 3  . BD PEN NEEDLE NANO 2ND GEN 32G X 4 MM MISC USE 1 PEN NEEDLE 2 (TWO) TIMES DAILY. 200 each 0  . dapagliflozin propanediol (FARXIGA) 10 MG TABS tablet Take 10 mg by mouth daily. 30 tablet 11  . gabapentin (NEURONTIN) 300 MG capsule Take 1 capsule (300 mg total) by mouth at bedtime. 90 capsule 1  . glucose blood (TRUE METRIX BLOOD GLUCOSE TEST) test strip 1 each by Other route 3 (three) times daily. Use as instructed 300 each 3  . ibuprofen (ADVIL,MOTRIN) 200 MG tablet Take 400 mg by mouth every 8 (eight) hours as needed for headache or moderate pain.     . Insulin Glargine (BASAGLAR KWIKPEN) 100 UNIT/ML SOPN Inject 0.3 mLs (30 Units total) into the skin at bedtime. 15 mL 11  . letrozole (FEMARA) 2.5 MG tablet Take 1 tablet (2.5 mg total) by mouth daily. 90 tablet 3  . lisinopril-hydrochlorothiazide (ZESTORETIC) 20-25 MG tablet Take 1 tablet by mouth once daily 90 tablet 0  . metFORMIN (GLUCOPHAGE) 1000 MG tablet Take 1 tablet (1,000 mg  total) by mouth 2 (two) times daily with a meal. 180 tablet 3  . NON FORMULARY Peripheral Neuropathy cream from [place]    . Omega-3 Fatty Acids (FISH OIL) 1200 MG CAPS Take 1,200 mg by mouth daily.     Marland Kitchen triamcinolone cream (KENALOG) 0.1 % APPLY 1 APPLICATION TOPICALLY 2 (TWO) TIMES DAILY TO AFFECTED AREAS 80 g 0  . TRUEPLUS LANCETS 33G MISC 1 each by Does not apply route 3 (three) times daily. 600 each 2   No current facility-administered medications on file prior to visit.     Allergies  Allergen Reactions  . Codeine Other (See Comments)    "get crazy", "see things that aren't there"    Objective: Lindsey Marquez is a pleasant 69 y.o. y.o. Patient Race: Black or African American [2]  female in NAD. AAO x 3.  There were no vitals filed for this visit.  Vascular Examination: Capillary refill time to digits immediate b/l. Palpable DP pulses b/l. Palpable PT pulses b/l. Pedal hair absent b/l. Skin temperature gradient within normal limits b/l. No pain with calf compression b/l. No edema noted b/l.  Dermatological Examination: Pedal skin with normal turgor, texture and tone bilaterally. No open wounds bilaterally. No interdigital macerations bilaterally. Toenails 1-5 b/l elongated, discolored, dystrophic, thickened, crumbly with subungual debris and tenderness to dorsal palpation. Porokeratotic lesion(s) submet head 2 left foot and submet head 4 right foot. No  erythema, no edema, no drainage, no flocculence.  Musculoskeletal: Normal muscle strength 5/5 to all lower extremity muscle groups bilaterally. No pain crepitus or joint limitation noted with ROM b/l. Hammertoes noted to the 2-5 bilaterally.  Neurological Examination: Protective sensation intact 5/5 intact bilaterally with 10g monofilament b/l. Vibratory sensation intact b/l. Proprioception intact bilaterally.  Assessment: 1. Pain due to onychomycosis of toenails of both feet   2. Porokeratosis   3. Acquired  hammertoes of both feet   4. Type 2 diabetes mellitus with hyperglycemia, with long-term current use of insulin ([place])   Plan: -Examined patient. -Continue diabetic foot care principles. -Toenails 1-5 b/l were debrided in length and girth with sterile nail nippers and dremel without iatrogenic bleeding.  -Painful porokeratotic lesion(s) submet head 2 left foot and submet head 4 right foot pared and enucleated with sterile scalpel blade without incident. -Patient to report any pedal injuries to medical professional immediately. -Patient to continue soft, supportive shoe gear daily. -Patient/POA to call should there be question/concern in the interim.  Return in about 3 months (around 08/08/2020) for diabetic nail and callus trim.  Marzetta Board, DPM

## 2020-05-15 ENCOUNTER — Encounter: Payer: Self-pay | Admitting: Internal Medicine

## 2020-05-15 DIAGNOSIS — E119 Type 2 diabetes mellitus without complications: Secondary | ICD-10-CM | POA: Diagnosis not present

## 2020-05-15 DIAGNOSIS — H40003 Preglaucoma, unspecified, bilateral: Secondary | ICD-10-CM | POA: Diagnosis not present

## 2020-05-15 LAB — HM DIABETES EYE EXAM

## 2020-05-19 ENCOUNTER — Other Ambulatory Visit: Payer: Self-pay | Admitting: Internal Medicine

## 2020-05-22 ENCOUNTER — Other Ambulatory Visit: Payer: Medicare HMO | Admitting: Orthotics

## 2020-05-24 ENCOUNTER — Telehealth: Payer: Self-pay | Admitting: Internal Medicine

## 2020-05-24 MED ORDER — TRUE METRIX BLOOD GLUCOSE TEST VI STRP: 1.0000 | ORAL_STRIP | Freq: Three times a day (TID) | 2 refills | Status: DC

## 2020-05-24 NOTE — Telephone Encounter (Signed)
RX sent

## 2020-05-24 NOTE — Telephone Encounter (Signed)
Medication Refill Request  Did you call your pharmacy and request this refill first? Yes  . If patient has not contacted pharmacy first, instruct them to do so for future refills.  . Remind them that contacting the pharmacy for their refill is the quickest method to get the refill.  . Refill policy also stated that it will take anywhere between 24-72 hours to receive the refill.    Name of medication? Test strips  Is this a 90 day supply? Not specified  Name and location of pharmacy?  [id number] [address], [place] Phone:  [phone]  Fax:  [phone]       . Is the request for diabetes test strips? Yes  . If yes, what brand? Once Touch

## 2020-05-27 ENCOUNTER — Other Ambulatory Visit: Payer: Medicare HMO | Admitting: Orthotics

## 2020-05-29 ENCOUNTER — Other Ambulatory Visit: Payer: Self-pay

## 2020-05-29 MED ORDER — ONETOUCH VERIO VI STRP: ORAL_STRIP | 12 refills | Status: DC

## 2020-05-29 MED ORDER — ONETOUCH VERIO W/DEVICE KIT: PACK | 0 refills | Status: DC

## 2020-05-29 NOTE — Telephone Encounter (Signed)
Fax from pharmacy stating insurance only covers [facility] supplies.  New orders sent.

## 2020-05-30 ENCOUNTER — Ambulatory Visit: Payer: Medicare HMO | Admitting: Orthotics

## 2020-05-30 ENCOUNTER — Other Ambulatory Visit: Payer: Self-pay

## 2020-05-30 DIAGNOSIS — B351 Tinea unguium: Secondary | ICD-10-CM

## 2020-05-30 DIAGNOSIS — Q828 Other specified congenital malformations of skin: Secondary | ICD-10-CM

## 2020-05-30 DIAGNOSIS — M2041 Other hammer toe(s) (acquired), right foot: Secondary | ICD-10-CM

## 2020-05-30 DIAGNOSIS — M79674 Pain in right toe(s): Secondary | ICD-10-CM

## 2020-05-30 DIAGNOSIS — M2042 Other hammer toe(s) (acquired), left foot: Secondary | ICD-10-CM

## 2020-05-30 NOTE — Progress Notes (Signed)

## 2020-06-10 ENCOUNTER — Telehealth: Payer: Self-pay

## 2020-06-10 NOTE — Telephone Encounter (Signed)
Forms for [facility] filled out, signed by Dr. Cruzita Lederer and faxed to [facility] with confirmation.

## 2020-07-02 ENCOUNTER — Other Ambulatory Visit: Payer: Self-pay

## 2020-07-02 ENCOUNTER — Ambulatory Visit
Admission: RE | Admit: 2020-07-02 | Discharge: 2020-07-02 | Disposition: A | Discharge status: Home / Self Care | Payer: Medicare HMO | Source: Ambulatory Visit | Attending: Hematology | Admitting: Hematology

## 2020-07-02 DIAGNOSIS — Z1231 Encounter for screening mammogram for malignant neoplasm of breast: Secondary | ICD-10-CM

## 2020-07-29 ENCOUNTER — Telehealth: Payer: Self-pay

## 2020-07-29 DIAGNOSIS — C50912 Malignant neoplasm of unspecified site of left female breast: Secondary | ICD-10-CM | POA: Diagnosis not present

## 2020-07-29 NOTE — Telephone Encounter (Signed)
[place] [facility] - Client TELEPHONE ADVICE RECORD [facility] Patient Name: Lindsey Marquez Gender: Female DOB: [birth year] Age: 69 Y 2 M 2 D Return Phone Number: [phone] (Primary) [place] [address] Client [place] [facility] - Client Client [facility] - Day Physician Webb Silversmith - NP Contact Type Call Who Is Calling Patient / Member / Family / Caregiver Call Type Triage / Clinical Relationship To Patient Self Return Phone Number [phone] (Primary) Chief Complaint Dizziness Reason for Call Symptomatic / Request for [name] says that she feels off balance; not dizzy. She is coughing a lot, and she feels phlegm in her chest, and feels congestion in her ear when she coughs. Translation No Disp. Time Eilene Ghazi Time) Disposition Final User 07/29/2020 10:42:27 AM Attempt made - message left Zenia Resides, RN, Diane 07/29/2020 10:59:03 AM FINAL ATTEMPT MADE - message left Yes Zenia Resides, RN, Diane

## 2020-07-29 NOTE — Telephone Encounter (Addendum)
Per appt notes pt already has appt with Avie Echevaria NP on 07/30/20 at 3:15.Unable to reach pt, pts sister or neice by phone.

## 2020-07-30 ENCOUNTER — Other Ambulatory Visit: Payer: Self-pay

## 2020-07-30 ENCOUNTER — Telehealth: Payer: Medicare HMO | Admitting: Internal Medicine

## 2020-07-30 ENCOUNTER — Encounter: Payer: Self-pay | Admitting: Internal Medicine

## 2020-07-30 NOTE — Progress Notes (Signed)
  Pt unable to perform video visit, phone visit not attempted. Webb Silversmith, NP

## 2020-07-31 ENCOUNTER — Ambulatory Visit (HOSPITAL_COMMUNITY)
Admission: EM | Admit: 2020-07-31 | Discharge: 2020-07-31 | Disposition: A | Discharge status: Home / Self Care | Payer: Medicare HMO | Attending: Family Medicine | Admitting: Family Medicine

## 2020-07-31 ENCOUNTER — Other Ambulatory Visit: Payer: Self-pay

## 2020-07-31 ENCOUNTER — Encounter (HOSPITAL_COMMUNITY): Payer: Self-pay

## 2020-07-31 DIAGNOSIS — U071 COVID-19: Secondary | ICD-10-CM | POA: Diagnosis not present

## 2020-07-31 DIAGNOSIS — R05 Cough: Secondary | ICD-10-CM | POA: Diagnosis present

## 2020-07-31 DIAGNOSIS — I10 Essential (primary) hypertension: Secondary | ICD-10-CM | POA: Insufficient documentation

## 2020-07-31 DIAGNOSIS — Z87891 Personal history of nicotine dependence: Secondary | ICD-10-CM | POA: Insufficient documentation

## 2020-07-31 DIAGNOSIS — J069 Acute upper respiratory infection, unspecified: Secondary | ICD-10-CM | POA: Diagnosis not present

## 2020-07-31 DIAGNOSIS — E119 Type 2 diabetes mellitus without complications: Secondary | ICD-10-CM | POA: Insufficient documentation

## 2020-07-31 DIAGNOSIS — Z9012 Acquired absence of left breast and nipple: Secondary | ICD-10-CM | POA: Diagnosis not present

## 2020-07-31 DIAGNOSIS — Z885 Allergy status to narcotic agent status: Secondary | ICD-10-CM | POA: Insufficient documentation

## 2020-07-31 MED ORDER — BENZONATATE 100 MG PO CAPS: ORAL_CAPSULE | ORAL | 0 refills | Status: DC

## 2020-07-31 NOTE — Discharge Instructions (Addendum)
You have been tested for COVID-19 today. °If your test returns positive, you will receive a phone call from [place] regarding your results. °Negative test results are not called. °Both positive and negative results area always visible on MyChart. °If you do not have a MyChart account, sign up instructions are provided in your discharge papers. °Please do not hesitate to contact us should you have questions or concerns. ° °

## 2020-07-31 NOTE — ED Triage Notes (Signed)
Pt presents with non productive cough and right ear pain since Saturday; pt states she has rib pain when she coughs.

## 2020-08-01 LAB — SARS CORONAVIRUS 2 (TAT 6-24 HRS): SARS Coronavirus 2: POSITIVE — AB

## 2020-08-01 NOTE — ED Provider Notes (Signed)
[place]   [id number] 07/31/20 Arrival Time: 1907  ASSESSMENT & PLAN:  1. Viral URI with cough      COVID-19 testing sent. See letter/work note on file for self-isolation guidelines. OTC symptom care as needed.  Meds ordered this encounter  Medications  . benzonatate (TESSALON) 100 MG capsule    Sig: Take 1 capsule by mouth every 8 (eight) hours for cough.    Dispense:  21 capsule    Refill:  0      Follow-up Information    [name], NP.   Specialties: Internal Medicine, Emergency Medicine Why: As needed. Contact information: [place] [address] [phone]               Reviewed expectations re: course of current medical issues. Questions answered. Outlined signs and symptoms indicating need for more acute intervention. Understanding verbalized. After Visit Summary given.   SUBJECTIVE: History from: patient. Lindsey Marquez is a 69 y.o. female who reports non-prod cough and right ear discomfort for the past several days. Rib pain with coughing. Known COVID-19 contact: none. Recent travel: none. Denies: difficulty breathing. Normal PO intake without n/v/d.    OBJECTIVE:  Vitals:   07/31/20 2007  BP: (!) 167/76  Pulse: 98  Resp: 17  Temp: 99.6 F (37.6 C)  TempSrc: Oral  SpO2: 94%    General appearance: alert; no distress Eyes: PERRLA; EOMI; conjunctiva normal HENT: [place]; AT; nasal congestion Neck: supple  Lungs: speaks full sentences without difficulty; unlabored; dry cough Extremities: no edema Skin: warm and dry Neurologic: normal gait Psychological: alert and cooperative; normal mood and affect   Allergies  Allergen Reactions  . Codeine Other (See Comments)    "get crazy", "see things that aren't there"    Past Medical History:  Diagnosis Date  . Blood transfusion without reported diagnosis   . Breast cancer [facility]) August 2002   Invasive ductal carcinoma Left breast. 2002, 2017  .  Diabetes mellitus without complication ([place])   . Family history of malignant neoplasm of ovary   . Family history of pancreatic cancer   . Hypertension    Social History   Socioeconomic History  . Marital status: Divorced    Spouse name: Not on file  . Number of children: Not on file  . Years of education: Not on file  . Highest education level: Not on file  Occupational History  . Not on file  Tobacco Use  . Smoking status: Former Smoker    Packs/day: 0.10    Years: 7.00    Pack years: 0.70    Quit date: 12/01/1975    Years since quitting: 44.6  . Smokeless tobacco: Never Used  Vaping Use  . Vaping Use: Never used  Substance and Sexual Activity  . Alcohol use: No    Alcohol/week: 0.0 standard drinks  . Drug use: No  . Sexual activity: Yes  Other Topics Concern  . Not on file  Social History Narrative  . Not on file   Social Determinants of Health   Financial Resource Strain:   . Difficulty of Paying Living Expenses: Not on file  Food Insecurity:   . Worried About Charity fundraiser in the Last Year: Not on file  . Ran Out of Food in the Last Year: Not on file  Transportation Needs:   . Lack of Transportation (Medical): Not on file  . Lack of Transportation (Non-Medical): Not on file  Physical Activity:   .  Days of Exercise per Week: Not on file  . Minutes of Exercise per Session: Not on file  Stress:   . Feeling of Stress : Not on file  Social Connections:   . Frequency of Communication with Friends and Family: Not on file  . Frequency of Social Gatherings with Friends and Family: Not on file  . Attends Religious Services: Not on file  . Active Member of Clubs or Organizations: Not on file  . Attends Archivist Meetings: Not on file  . Marital Status: Not on file  Intimate Partner Violence:   . Fear of Current or Ex-Partner: Not on file  . Emotionally Abused: Not on file  . Physically Abused: Not on file  . Sexually Abused: Not on file   Family  History  Problem Relation Age of Onset  . Prostate cancer Father   . Ovarian cancer Maternal Aunt 56       deceased  . Pancreatic cancer Mother 73       deceased  . Cancer Maternal Aunt        2 other mat aunts; unk. primary in 61s; deceased  . Cancer Maternal Uncle        "bone ca"; unk. primary; deceased 59s  . Prostate cancer Maternal Uncle        deceased 63  . Colon cancer Neg Hx   . Esophageal cancer Neg Hx   . Rectal cancer Neg Hx   . Stomach cancer Neg Hx    Past Surgical History:  Procedure Laterality Date  . BREAST SURGERY Left 2003  . COLONOSCOPY    . FOOT SURGERY  2000  . INSERTION OF MESH N/A 02/17/2018   Procedure: INSERTION OF MESH;  Surgeon: [name], MD;  Location: [place];  Service: General;  Laterality: N/A;  . MASTECTOMY Left 05/28/2016  . port a cath insertion  05/28/2016  . PORT-A-CATH REMOVAL Right 08/19/2017   Procedure: REMOVAL PORT-A-CATH;  Surgeon: [name], MD;  Location: [place];  Service: General;  Laterality: Right;  . PORTACATH PLACEMENT Right 05/28/2016   Procedure: INSERTION PORT-A-CATH;  Surgeon: [name], MD;  Location: [place];  Service: General;  Laterality: Right;  . TOTAL MASTECTOMY Left 05/28/2016   Procedure: LEFT  MASTECTOMY;  Surgeon: [name], MD;  Location: [place];  Service: General;  Laterality: Left;  . TUBAL LIGATION  22 yrs since  2004.   Bilateral.  . VENTRAL HERNIA REPAIR  02/17/2018   open  . VENTRAL HERNIA REPAIR N/A 02/17/2018   Procedure: OPEN VENTRAL HERNIA REPAIR WITH MESH;  Surgeon: [name], MD;  Location: [place];  Service: General;  Laterality: Ginette Pitman, MD 08/01/20 [phone]

## 2020-08-16 ENCOUNTER — Encounter: Payer: Self-pay | Admitting: Podiatry

## 2020-08-16 ENCOUNTER — Other Ambulatory Visit: Payer: Self-pay

## 2020-08-16 ENCOUNTER — Ambulatory Visit: Payer: Medicare HMO | Admitting: Podiatry

## 2020-08-16 ENCOUNTER — Ambulatory Visit: Payer: Medicare HMO | Admitting: Orthotics

## 2020-08-16 DIAGNOSIS — B351 Tinea unguium: Secondary | ICD-10-CM

## 2020-08-16 DIAGNOSIS — M2041 Other hammer toe(s) (acquired), right foot: Secondary | ICD-10-CM

## 2020-08-16 DIAGNOSIS — M2042 Other hammer toe(s) (acquired), left foot: Secondary | ICD-10-CM | POA: Diagnosis not present

## 2020-08-16 DIAGNOSIS — E1142 Type 2 diabetes mellitus with diabetic polyneuropathy: Secondary | ICD-10-CM

## 2020-08-16 DIAGNOSIS — Q828 Other specified congenital malformations of skin: Secondary | ICD-10-CM

## 2020-08-16 DIAGNOSIS — Z794 Long term (current) use of insulin: Secondary | ICD-10-CM | POA: Diagnosis not present

## 2020-08-16 DIAGNOSIS — M79675 Pain in left toe(s): Secondary | ICD-10-CM

## 2020-08-16 DIAGNOSIS — E1165 Type 2 diabetes mellitus with hyperglycemia: Secondary | ICD-10-CM | POA: Diagnosis not present

## 2020-08-16 DIAGNOSIS — M79674 Pain in right toe(s): Secondary | ICD-10-CM | POA: Diagnosis not present

## 2020-08-16 DIAGNOSIS — M2032 Hallux varus (acquired), left foot: Secondary | ICD-10-CM

## 2020-08-18 NOTE — Progress Notes (Signed)
Subjective: Lindsey Marquez presents today preventative diabetic foot care and painful porokeratotic lesion(s) b/l and painful mycotic toenails b/l that limit ambulation. Aggravating factors include weightbearing with and without shoe gear. Pain for both is relieved with periodic professional debridement.   She voices no new pedal concerns on today's visit.  She is also here to pick up her diabetic shoes today.  [name], NP is patient's PCP. Last visit was: 07/30/2020.  Past Medical History:  Diagnosis Date  . Blood transfusion without reported diagnosis   . Breast cancer [facility]) August 2002   Invasive ductal carcinoma Left breast. 2002, 2017  . Diabetes mellitus without complication ([place])   . Family history of malignant neoplasm of ovary   . Family history of pancreatic cancer   . Hypertension      Current Outpatient Medications on File Prior to Visit  Medication Sig Dispense Refill  . aspirin 81 MG tablet Take 81 mg by mouth daily as needed (leg pain).     Marland Kitchen atorvastatin (LIPITOR) 10 MG tablet Take 0.5 tablets (5 mg total) by mouth daily. 45 tablet 3  . BD PEN NEEDLE NANO 2ND GEN 32G X 4 MM MISC USE 1 PEN NEEDLE 2 (TWO) TIMES DAILY. 200 each 1  . benzonatate (TESSALON) 100 MG capsule Take 1 capsule by mouth every 8 (eight) hours for cough. 21 capsule 0  . Blood Glucose Monitoring Suppl (ONETOUCH VERIO) w/Device KIT Use to check blood sugar 3 times a day. E11.9 1 kit 0  . dapagliflozin propanediol (FARXIGA) 10 MG TABS tablet Take 10 mg by mouth daily. 30 tablet 11  . gabapentin (NEURONTIN) 300 MG capsule Take 1 capsule (300 mg total) by mouth at bedtime. 90 capsule 1  . glucose blood (ONETOUCH VERIO) test strip Use to check blood sugar 3 times a day. E11.9 300 each 12  . ibuprofen (ADVIL,MOTRIN) 200 MG tablet Take 400 mg by mouth every 8 (eight) hours as needed for headache or moderate pain.     . Insulin Glargine (BASAGLAR KWIKPEN) 100 UNIT/ML SOPN Inject 0.3 mLs (30 Units  total) into the skin at bedtime. 15 mL 11  . letrozole (FEMARA) 2.5 MG tablet Take 1 tablet (2.5 mg total) by mouth daily. 90 tablet 3  . lisinopril-hydrochlorothiazide (ZESTORETIC) 20-25 MG tablet Take 1 tablet by mouth once daily 90 tablet 0  . metFORMIN (GLUCOPHAGE) 1000 MG tablet Take 1 tablet (1,000 mg total) by mouth 2 (two) times daily with a meal. 180 tablet 3  . NON FORMULARY Peripheral Neuropathy cream from [place]    . Omega-3 Fatty Acids (FISH OIL) 1200 MG CAPS Take 1,200 mg by mouth daily.     Marland Kitchen triamcinolone cream (KENALOG) 0.1 % APPLY 1 APPLICATION TOPICALLY 2 (TWO) TIMES DAILY TO AFFECTED AREAS 80 g 0  . TRUEPLUS LANCETS 33G MISC 1 each by Does not apply route 3 (three) times daily. 600 each 2   No current facility-administered medications on file prior to visit.     Allergies  Allergen Reactions  . Codeine Other (See Comments)    "get crazy", "see things that aren't there"    Objective: Lindsey Marquez is a pleasant 69 y.o. African American female in NAD. AAO x 3.  There were no vitals filed for this visit.  Vascular Examination: Capillary refill time to digits immediate b/l. Palpable DP pulses b/l. Palpable PT pulses b/l. Pedal hair absent b/l. Skin temperature gradient within normal limits b/l. No pain with calf compression  b/l. No edema noted b/l.  Dermatological Examination: Pedal skin with normal turgor, texture and tone bilaterally. No open wounds bilaterally. No interdigital macerations bilaterally. Toenails 1-5 b/l elongated, discolored, dystrophic, thickened, crumbly with subungual debris and tenderness to dorsal palpation. Porokeratotic lesion(s) submet head 2 left foot and submet head 4 right foot. No erythema, no edema, no drainage, no flocculence.  Musculoskeletal: Normal muscle strength 5/5 to all lower extremity muscle groups bilaterally. No pain crepitus or joint limitation noted with ROM b/l. Hammertoes noted to the 2-5  bilaterally.  Neurological Examination: Pt has subjective symptoms of neuropathy. Protective sensation intact 5/5 intact bilaterally with 10g monofilament b/l. Vibratory sensation intact b/l. Proprioception intact bilaterally.  Assessment: 1. Pain due to onychomycosis of toenails of both feet   2. Porokeratosis   3. Acquired hammertoes of both feet   4. Acquired hallux varus of left foot   5. Diabetic peripheral neuropathy associated with type 2 diabetes mellitus ([place])    Plan: -Examined patient. -No new findings. No new orders. -Continue diabetic foot care principles. -Toenails 1-5 b/l were debrided in length and girth with sterile nail nippers and dremel without iatrogenic bleeding.  -Painful porokeratotic lesion(s) submet head 2 left foot and submet head 4 right foot pared and enucleated with sterile scalpel blade without incident. -Dispensed one pair diabetic shoes and 3 pair total contact insoles. Shoes were appropriate fit with no heel slippage. Reviewed warranty information and patient signed all paperwork stating patient received shoes, insert(s)/filler(s), break-in instructions and warranty information. Patient instructed not to wear shoes outside unless completely satisfied. Patient related understanding. -Patient to report any pedal injuries to medical professional immediately. -Patient to continue soft, supportive shoe gear daily. -Patient/POA to call should there be question/concern in the interim.  Return in about 3 months (around 11/15/2020).  Marzetta Board, DPM

## 2020-08-27 ENCOUNTER — Ambulatory Visit: Payer: Medicare HMO | Admitting: Internal Medicine

## 2020-08-28 NOTE — Progress Notes (Signed)
[place]   Telephone:(336) [phone] Fax:(336) [phone]   Clinic Follow up Note   Patient Care Team: [name], NP as PCP - General (Internal Medicine) [name], MD as Consulting Physician (General Surgery) [name], MD as Consulting Physician (Hematology) Gardenia Phlegm, NP as Nurse Practitioner (Hematology and Oncology) 08/29/2020  CHIEF COMPLAINT: F/u recurrent left breast cancer   SUMMARY OF ONCOLOGIC HISTORY: Oncology History Overview Note  Cancer of central portion of left breast [facility])   Staging form: Breast, AJCC 7th Edition     Clinical stage from 04/17/2016: Stage IA (T1c, N0, M0) - Signed by [name], MD on 05/07/2016     Pathologic stage from 05/28/2016: Stage IIA (T2, N0, cM0) - Signed by [name], MD on 06/11/2016 History of left breast cancer   Staging form: Breast, AJCC 7th Edition     Clinical: Stage IIIB (T4d, N1, cM0) - Signed by [name], MD on 12/14/2013     Pathologic: Stage IIIB (T4d, N1a, cM0) - Signed by [name], MD on 12/14/2013     History of left breast cancer (Resolved)  09/12/2002 Imaging   CT scan show no evidence of disease apart from the left axilla   09/2002 -  Neo-Adjuvant Chemotherapy   TAC X4 cycles    01/10/2003 Surgery   The patient had left lumpectomy and axillary lymph node dissection which show residual breast cancer and a sentinel lymph node was positive   2004 -  Adjuvant Chemotherapy   Cytoxan with 5-FU x4 cycles (methotrexate added at cycle 4).   2004 -  Radiation Therapy   Adjuvant left breast radiation   12/2002 Receptors her2   ER, PR and HER-2 were all negative.   Cancer of central portion of left breast ([place])  04/13/2016 Mammogram   Scheduler coarse heterogeneous calcifications spanning an area of 3.7 cm in the lumpectomy site, indeterminate. Ultrasound of the left axilla was negative.   04/17/2016 Initial Diagnosis   Cancer of central portion of left breast ([place])   04/17/2016  Initial Biopsy   Left breast core needle biopsy showed invasive and in situ ductal carcinoma with calcification, grade 2.   04/17/2016 Receptors her2   ER 100% positive, PR 15% positive, strong staining, Ki-67 10%, HER-2 positive by IHC (3)   05/05/2016 Imaging   Bilateral breast MRI showed a 1.3 x 0.8 x 0.7 cm biopsy-proven ductal carcinoma in the region of the previous lumpectomy. Enlarged lobulated right inferior axillary lymph node with diffuse cortical thickening, biopsy is recommended.   05/15/2016 Pathology Results   right axilary node biopsy was negative for malignant cell    05/28/2016 Surgery   Simple left mastectomy   05/28/2016 Pathology Results   Left mastectomy showed invasive ductal carcinoma, grade 3, 3.6 cm, high grade DCIS, (+) LVI, surgical margins were negative. Tumor 3.6 cm, no lymph nodes identified.   06/25/2016 Imaging   Staging CT scan of the chest, abdomen and pelvis with contrast and a bone scan showed no evidence of metastasis. Postoperative fluid collection in the left breast, likely a seroma or hematoma. Right axillary adenopathy, which was biopsied previously.    07/07/2016 - 07/15/2017 Chemotherapy   Adjuvant chemotherapy with docetaxel, carboplatin, Herceptin and pejeta every 3 weeks, for 6 cycles, followed by Herceptin maintenance therapy for total of one year treatment  [facility]ed [facility] 11/04/16     12/15/2016 -  Anti-estrogen oral therapy   Adjuvant letrozole 2.5 mg once daily   04/29/2017 Mammogram  Mammogram 04/29/2017 IMPRESSION: No mammographic evidence of malignancy. A result letter of this screening mammogram will be mailed directly to the patient.    08/26/2017 - 07/2018 Antibody Plan   She started Nerlynx 277m on 08/26/17, was held on 09/14/17 due to significant diarrhea and restarted when her diarrhea resolved on 09/21/17. Completed in 07/2018   09/22/2017 Procedure   Colonoscopy by Dr. NSilverio Decamp IMPRESSION: - One 3 mm polyp in the transverse colon,  removed with a cold biopsy forceps. Resected and retrieved. - Non-bleeding internal hemorrhoids.   09/22/2017 Pathology Results   Diagnosis, colonoscopy  Surgical [P], transverse, polyp - TUBULAR ADENOMA(S). - HIGH GRADE DYSPLASIA IS NOT IDENTIFIED.     CURRENT THERAPY: Letrozole 2.5 mg, started 12/15/2016  INTERVAL HISTORY: Ms. COsterbergreturns for f/u as scheduled. She was last seen by Dr. FBurr Medicoon 03/01/20. Mammogram 06/2020 is negative.  She is doing well since last visit.  Towards the end of the summer she noted a cough, fatigue, and no appetite, tested positive for Covid on 07/31/2020.  She has recovered well.  Continues letrozole, tolerating well.  She has pain in neuropathy in her feet which is stable, takes gabapentin 300 mg at night and applies "arthritis cream."  She is able to function well.  Denies significant hot flashes.  Appetite and energy are fair, no fever, chills, cough, chest pain, dyspnea, leg edema, nausea, vomiting, constipation, diarrhea, bleeding, concerns in her breast or other new issues.    MEDICAL HISTORY:  Past Medical History:  Diagnosis Date  . Blood transfusion without reported diagnosis   . Breast cancer ([facility] August 2002   Invasive ductal carcinoma Left breast. 2002, 2017  . Diabetes mellitus without complication ([place]   . Family history of malignant neoplasm of ovary   . Family history of pancreatic cancer   . Hypertension     SURGICAL HISTORY: Past Surgical History:  Procedure Laterality Date  . BREAST SURGERY Left 2003  . COLONOSCOPY    . FOOT SURGERY  2000  . INSERTION OF MESH N/A 02/17/2018   Procedure: INSERTION OF MESH;  Surgeon: TDonnie Mesa MD;  [place]  Service: General;  Laterality: N/A;  . MASTECTOMY Left 05/28/2016  . port a cath insertion  05/28/2016  . PORT-A-CATH REMOVAL Right 08/19/2017   Procedure: REMOVAL PORT-A-CATH;  Surgeon: TDonnie Mesa MD;  [place]  Service: General;  Laterality: Right;   . PORTACATH PLACEMENT Right 05/28/2016   Procedure: INSERTION PORT-A-CATH;  Surgeon: MDonnie Mesa MD;  [place]  Service: General;  Laterality: Right;  . TOTAL MASTECTOMY Left 05/28/2016   Procedure: LEFT  MASTECTOMY;  Surgeon: MDonnie Mesa MD;  [place]  Service: General;  Laterality: Left;  . TUBAL LIGATION  22 yrs since  2004.   Bilateral.  . VENTRAL HERNIA REPAIR  02/17/2018   open  . VENTRAL HERNIA REPAIR N/A 02/17/2018   Procedure: OPEN VENTRAL HERNIA REPAIR WITH MESH;  Surgeon: TDonnie Mesa MD;  [place]  Service: General;  Laterality: N/A;    I have reviewed the social history and family history with the patient and they are unchanged from previous note.  ALLERGIES:  is allergic to codeine.  MEDICATIONS:  Current Outpatient Medications  Medication Sig Dispense Refill  . aspirin 81 MG tablet Take 81 mg by mouth daily as needed (leg pain).     .Marland Kitchenatorvastatin (LIPITOR) 10 MG tablet Take 0.5 tablets (5 mg total) by mouth daily. 45 tablet  3  . BD PEN NEEDLE NANO 2ND GEN 32G X 4 MM MISC USE 1 PEN NEEDLE 2 (TWO) TIMES DAILY. 200 each 1  . benzonatate (TESSALON) 100 MG capsule Take 1 capsule by mouth every 8 (eight) hours for cough. 21 capsule 0  . Blood Glucose Monitoring Suppl (ONETOUCH VERIO) w/Device KIT Use to check blood sugar 3 times a day. E11.9 1 kit 0  . dapagliflozin propanediol (FARXIGA) 10 MG TABS tablet Take 10 mg by mouth daily. 30 tablet 11  . gabapentin (NEURONTIN) 300 MG capsule Take 1-2 capsules (300-600 mg total) by mouth at bedtime. 120 capsule 1  . glucose blood (ONETOUCH VERIO) test strip Use to check blood sugar 3 times a day. E11.9 300 each 12  . ibuprofen (ADVIL,MOTRIN) 200 MG tablet Take 400 mg by mouth every 8 (eight) hours as needed for headache or moderate pain.     . Insulin Glargine (BASAGLAR KWIKPEN) 100 UNIT/ML SOPN Inject 0.3 mLs (30 Units total) into the skin at bedtime. 15 mL 11  . letrozole (FEMARA) 2.5 MG tablet Take 1  tablet (2.5 mg total) by mouth daily. 90 tablet 3  . lisinopril-hydrochlorothiazide (ZESTORETIC) 20-25 MG tablet Take 1 tablet by mouth once daily 90 tablet 0  . metFORMIN (GLUCOPHAGE) 1000 MG tablet Take 1 tablet (1,000 mg total) by mouth 2 (two) times daily with a meal. 180 tablet 3  . NON FORMULARY Peripheral Neuropathy cream from [place]    . Omega-3 Fatty Acids (FISH OIL) 1200 MG CAPS Take 1,200 mg by mouth daily.     Marland Kitchen triamcinolone cream (KENALOG) 0.1 % APPLY 1 APPLICATION TOPICALLY 2 (TWO) TIMES DAILY TO AFFECTED AREAS 80 g 0  . TRUEPLUS LANCETS 33G MISC 1 each by Does not apply route 3 (three) times daily. 600 each 2   Current Facility-Administered Medications  Medication Dose Route Frequency Provider Last Rate Last Admin  . influenza vaccine adjuvanted (FLUAD) injection 0.5 mL  0.5 mL Intramuscular Once [name], MD        PHYSICAL EXAMINATION: ECOG PERFORMANCE STATUS: 0 - Asymptomatic  Vitals:   08/29/20 1144  BP: (!) 154/67  Pulse: (!) 112  Resp: 18  Temp: 98.2 F (36.8 C)  SpO2: 99%   Filed Weights   08/29/20 1144  Weight: 222 lb 11.2 oz (101 kg)    GENERAL:alert, no distress and comfortable SKIN: No rash to exposed skin EYES:  sclera clear NECK: Without mass LYMPH:  no palpable cervical or supraclavicular lymphadenopathy LUNGS: clear with normal breathing effort HEART: regular rate & rhythm, no lower extremity edema Musculoskeletal:no cyanosis of digits and no clubbing  NEURO: alert & oriented x 3 with fluent speech, normal gait Breast exam: S/p left mastectomy, incision completely healed.  No nodularity or mass along the incision or chest wall.  No palpable mass in right breast or either axilla that I could appreciate  LABORATORY DATA:  I have reviewed the data as listed CBC Latest Ref Rng & Units 08/29/2020 04/15/2020 03/01/2020  WBC 4.0 - 10.5 K/uL 9.8 7.9 7.8  Hemoglobin 12.0 - 15.0 g/dL 11.7(L) 12.1 12.2  Hematocrit 36 - 46 % 35.8(L) 34.7(L)  37.4  Platelets 150 - 400 K/uL 317 347.0 322     CMP Latest Ref Rng & Units 08/29/2020 04/15/2020 03/01/2020  Glucose 70 - 99 mg/dL 168(H) 222(H) 157(H)  BUN 8 - 23 mg/dL 18 17 18   Creatinine 0.44 - 1.00 mg/dL 0.99 0.83 0.96  Sodium 135 - 145 mmol/L 138  137 139  Potassium 3.5 - 5.1 mmol/L 4.1 4.3 4.1  Chloride 98 - 111 mmol/L 103 100 104  CO2 22 - 32 mmol/L 27 26 23   Calcium 8.9 - 10.3 mg/dL 10.1 9.9 9.6  Total Protein 6.5 - 8.1 g/dL 8.1 7.3 8.1  Total Bilirubin 0.3 - 1.2 mg/dL 0.3 0.4 0.5  Alkaline Phos 38 - 126 U/L 70 71 72  AST 15 - 41 U/L 19 24 34  ALT 0 - 44 U/L 21 26 36      RADIOGRAPHIC STUDIES: I have personally reviewed the radiological images as listed and agreed with the findings in the report. No results found.   ASSESSMENT & PLAN: Raygan Skarda Cookis a 69 y.o.femalewith   1. 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 She was treated with Neo-adjuvant chemo, left breast lumpectomy, adjuvant chemo and Radiation.  -She had another left breast cancer in 03/2016. She wastreatedwith left mastectomy, adjuvant chemo TCHP and Nerlynx.  -She started letrozole in 11/2016.Toleratingwell.  -06/2020 mammogram is negative -Continues letrozole and surveillance  2. Anemia  -Secondary to chemotherapy, resolved -Today's mild anemia may be related to recent COVID-19 infection, asymptomatic -Monitoring  3. DM and HTN  -She'll continue follow-up with her primary care physician -BG 241 today, she reports it is in 160-180 range at home -BP 156/71 today  4. Neuropathy on legs and feet -She has neuropathy of feet and legs with tightnessand tingling, likely from her DMand previous chemo.She will continue to wear compression socks.  -she started gabapentin at night, takes 300 mg -Slightly worse on letrozole, may increase gabapentin to 600 mg nightly if needed  5. Obesity  -stable  weight  6. Bone Health -We previously discussed that Letrozole can cause some bone weakening  -Her Bone density scan from 04/29/17 her Femur Neck Right T-score is -0.8. This patient is considered normal. -DEXA 05/2019 is normal, continues calcium and vitamin D  Disposition: Ms. Gilham is clinically doing well.  She continues to tolerate letrozole well overall.  For worsening foot pain and neuropathy she can increase gabapentin at night.  Breast exam is benign.  CMP is unremarkable, CBC shows mild anemia which may be related to recent COVID-19 infection.  She is asymptomatic.  She is otherwise doing well, no clinical concern for breast cancer recurrence.  We reviewed her mammogram from 06/2020 which is negative.  She will continue letrozole and breast cancer surveillance.  She will receive the flu vaccine today.  She plans to get the COVID-19 booster vaccine at a later date.  She will return for routine surveillance visit in 6 months.  I encouraged her to have a healthy diet, engage in physical activity, keep follow-up with routine care providers, and stay up-to-date on age-appropriate vaccines and cancer screenings.  All questions were answered. The patient knows to call the clinic with any problems, questions or concerns. No barriers to learning was detected.     Alla Feeling, NP 08/29/20

## 2020-08-29 ENCOUNTER — Encounter: Payer: Self-pay | Admitting: Nurse Practitioner

## 2020-08-29 ENCOUNTER — Inpatient Hospital Stay: Payer: Medicare HMO

## 2020-08-29 ENCOUNTER — Other Ambulatory Visit: Payer: Self-pay

## 2020-08-29 ENCOUNTER — Telehealth: Payer: Self-pay | Admitting: Hematology

## 2020-08-29 ENCOUNTER — Inpatient Hospital Stay: Payer: Medicare HMO | Attending: Nurse Practitioner | Admitting: Nurse Practitioner

## 2020-08-29 VITALS — BP 154/67 | HR 112 | Temp 98.2°F | Resp 18 | Ht 68.0 in | Wt 222.7 lb

## 2020-08-29 DIAGNOSIS — Z800 Family history of malignant neoplasm of digestive organs: Secondary | ICD-10-CM | POA: Diagnosis not present

## 2020-08-29 DIAGNOSIS — E119 Type 2 diabetes mellitus without complications: Secondary | ICD-10-CM | POA: Insufficient documentation

## 2020-08-29 DIAGNOSIS — Z23 Encounter for immunization: Secondary | ICD-10-CM | POA: Diagnosis not present

## 2020-08-29 DIAGNOSIS — Z885 Allergy status to narcotic agent status: Secondary | ICD-10-CM | POA: Insufficient documentation

## 2020-08-29 DIAGNOSIS — K648 Other hemorrhoids: Secondary | ICD-10-CM | POA: Insufficient documentation

## 2020-08-29 DIAGNOSIS — Z853 Personal history of malignant neoplasm of breast: Secondary | ICD-10-CM | POA: Diagnosis not present

## 2020-08-29 DIAGNOSIS — C50112 Malignant neoplasm of central portion of left female breast: Secondary | ICD-10-CM | POA: Diagnosis not present

## 2020-08-29 DIAGNOSIS — G620 Drug-induced polyneuropathy: Secondary | ICD-10-CM | POA: Insufficient documentation

## 2020-08-29 DIAGNOSIS — Z79899 Other long term (current) drug therapy: Secondary | ICD-10-CM | POA: Diagnosis not present

## 2020-08-29 DIAGNOSIS — Z170 Estrogen receptor positive status [ER+]: Secondary | ICD-10-CM | POA: Insufficient documentation

## 2020-08-29 DIAGNOSIS — I10 Essential (primary) hypertension: Secondary | ICD-10-CM | POA: Diagnosis not present

## 2020-08-29 DIAGNOSIS — D649 Anemia, unspecified: Secondary | ICD-10-CM | POA: Insufficient documentation

## 2020-08-29 DIAGNOSIS — D123 Benign neoplasm of transverse colon: Secondary | ICD-10-CM | POA: Diagnosis not present

## 2020-08-29 DIAGNOSIS — Z8041 Family history of malignant neoplasm of ovary: Secondary | ICD-10-CM | POA: Diagnosis not present

## 2020-08-29 DIAGNOSIS — T451X5A Adverse effect of antineoplastic and immunosuppressive drugs, initial encounter: Secondary | ICD-10-CM | POA: Diagnosis not present

## 2020-08-29 DIAGNOSIS — E669 Obesity, unspecified: Secondary | ICD-10-CM | POA: Insufficient documentation

## 2020-08-29 DIAGNOSIS — Z794 Long term (current) use of insulin: Secondary | ICD-10-CM | POA: Diagnosis not present

## 2020-08-29 LAB — CBC WITH DIFFERENTIAL (CANCER CENTER ONLY)
Abs Immature Granulocytes: 0.03 10*3/uL (ref 0.00–0.07)
Basophils Absolute: 0.1 10*3/uL (ref 0.0–0.1)
Basophils Relative: 1 %
Eosinophils Absolute: 0.2 10*3/uL (ref 0.0–0.5)
Eosinophils Relative: 2 %
HCT: 35.8 % — ABNORMAL LOW (ref 36.0–46.0)
Hemoglobin: 11.7 g/dL — ABNORMAL LOW (ref 12.0–15.0)
Immature Granulocytes: 0 %
Lymphocytes Relative: 25 %
Lymphs Abs: 2.5 10*3/uL (ref 0.7–4.0)
MCH: 32.5 pg (ref 26.0–34.0)
MCHC: 32.7 g/dL (ref 30.0–36.0)
MCV: 99.4 fL (ref 80.0–100.0)
Monocytes Absolute: 0.6 10*3/uL (ref 0.1–1.0)
Monocytes Relative: 6 %
Neutro Abs: 6.5 10*3/uL (ref 1.7–7.7)
Neutrophils Relative %: 66 %
Platelet Count: 317 10*3/uL (ref 150–400)
RBC: 3.6 MIL/uL — ABNORMAL LOW (ref 3.87–5.11)
RDW: 12.7 % (ref 11.5–15.5)
WBC Count: 9.8 10*3/uL (ref 4.0–10.5)
nRBC: 0 % (ref 0.0–0.2)

## 2020-08-29 LAB — COMPREHENSIVE METABOLIC PANEL
ALT: 21 U/L (ref 0–44)
AST: 19 U/L (ref 15–41)
Albumin: 3.7 g/dL (ref 3.5–5.0)
Alkaline Phosphatase: 70 U/L (ref 38–126)
Anion gap: 8 (ref 5–15)
BUN: 18 mg/dL (ref 8–23)
CO2: 27 mmol/L (ref 22–32)
Calcium: 10.1 mg/dL (ref 8.9–10.3)
Chloride: 103 mmol/L (ref 98–111)
Creatinine, Ser: 0.99 mg/dL (ref 0.44–1.00)
GFR calc Af Amer: >60 mL/min (ref >60)
GFR calc non Af Amer: 58 mL/min — ABNORMAL LOW (ref >60)
Glucose, Bld: 168 mg/dL — ABNORMAL HIGH (ref 70–99)
Potassium: 4.1 mmol/L (ref 3.5–5.1)
Sodium: 138 mmol/L (ref 135–145)
Total Bilirubin: 0.3 mg/dL (ref 0.3–1.2)
Total Protein: 8.1 g/dL (ref 6.5–8.1)

## 2020-08-29 MED ORDER — LETROZOLE 2.5 MG PO TABS: 2.5000 mg | ORAL_TABLET | Freq: Every day | ORAL | 3 refills | Status: DC

## 2020-08-29 MED ORDER — INFLUENZA VAC A&B SA ADJ QUAD 0.5 ML IM PRSY
0.5000 mL | PREFILLED_SYRINGE | Freq: Once | INTRAMUSCULAR | Status: AC
  Administered 2020-08-29: 0.5 mL via INTRAMUSCULAR

## 2020-08-29 MED ORDER — GABAPENTIN 300 MG PO CAPS: 300.0000 mg | ORAL_CAPSULE | Freq: Every day | ORAL | 1 refills | Status: DC

## 2020-08-29 MED ORDER — INFLUENZA VAC A&B SA ADJ QUAD 0.5 ML IM PRSY
PREFILLED_SYRINGE | INTRAMUSCULAR | Status: AC
  Filled 2020-08-29: qty 0.5

## 2020-08-29 NOTE — Telephone Encounter (Signed)
Scheduled appointment per 9/30 los. Patient is aware of appointments date and times. Patient declined calendar print out.

## 2020-09-16 ENCOUNTER — Other Ambulatory Visit: Payer: Self-pay | Admitting: Hematology

## 2020-09-16 DIAGNOSIS — C50112 Malignant neoplasm of central portion of left female breast: Secondary | ICD-10-CM

## 2020-09-17 NOTE — Telephone Encounter (Signed)
The note on this refill request says last reordered by Lacie 2 weeks ago for 90 caps   please check request for your review

## 2020-10-28 ENCOUNTER — Encounter: Payer: Self-pay | Admitting: Internal Medicine

## 2020-10-28 ENCOUNTER — Ambulatory Visit (INDEPENDENT_AMBULATORY_CARE_PROVIDER_SITE_OTHER): Payer: Medicare HMO | Admitting: Internal Medicine

## 2020-10-28 ENCOUNTER — Other Ambulatory Visit: Payer: Self-pay

## 2020-10-28 VITALS — BP 132/88 | HR 100 | Ht 68.0 in | Wt 226.6 lb

## 2020-10-28 DIAGNOSIS — Z794 Long term (current) use of insulin: Secondary | ICD-10-CM | POA: Diagnosis not present

## 2020-10-28 DIAGNOSIS — E1165 Type 2 diabetes mellitus with hyperglycemia: Secondary | ICD-10-CM

## 2020-10-28 DIAGNOSIS — E669 Obesity, unspecified: Secondary | ICD-10-CM

## 2020-10-28 DIAGNOSIS — E785 Hyperlipidemia, unspecified: Secondary | ICD-10-CM | POA: Diagnosis not present

## 2020-10-28 LAB — POCT GLYCOSYLATED HEMOGLOBIN (HGB A1C): Hemoglobin A1C: 7.9 % — AB (ref 4.0–5.6)

## 2020-10-28 NOTE — Progress Notes (Signed)
Patient ID: Lindsey Marquez, female   DOB: 1951/05/31, 69 y.o.   MRN: [id number]   This visit occurred during the SARS-CoV-2 public health emergency.  Safety protocols were in place, including screening questions prior to the visit, additional usage of staff PPE, and extensive cleaning of exam room while observing appropriate contact time as indicated for disinfecting solutions.   HPI: Lindsey Marquez is a 69 y.o.-year-old female, returning for follow-up for DM2, dx in ~2001, insulin-dependent, uncontrolled, without long-term complications. Last visit 6 months ago.  Before last visit, she started to work on her diet and taking her medicines consistently.  Sugars improved significantly.  Reviewed HbA1c levels: Lab Results  Component Value Date   HGBA1C 8.4 (H) 04/15/2020   HGBA1C 10.4 (A) 10/04/2019   HGBA1C 9.8 (H) 12/20/2018   Pt is on: - Basaglar 20 >> 30 units at bedtime - Metformin 1000 mg 2x a day with meals - Farxiga 10 mg daily before b'fast GLP1 R agonists were not affordable.  Pt checks her sugars once a day: - am: 199-240 >> 197 >> 108-151, 164, 186 >> 107-150, 155, 188 - 2h after b'fast: n/c - before lunch: 329 >> 175-200 >> 205 >> n/c >> 110-178, 183 - 2h after lunch: n/c >> 344 >> n/c - before dinner: 180-195 >> 200s >> 103-145, 174 >> n/c - 2h after dinner: n/c >> 120-160, 186, 192 >> 112-191, 216 - bedtime: 244, 266 >> 178-200 >> n/c  - nighttime: n/c Lowest sugar was 197 >> 108 >> 103; she has hypoglycemia awareness in the 70s. Highest sugar was 300 (bubble gum) >> 192 >> 216.  Glucometer: True metrix (we cannot download this)  Pt's meals are: - Breakfast:coffee, mc muffin, egg - Lunch: [place] sandwich - Dinner: baked chicken, veggies,dessert - apples, icecream - Snacks: yoghurt, pretzels, PB  -No CKD, last BUN/creatinine:  Lab Results  Component Value Date   BUN 18 08/29/2020   BUN 17 04/15/2020   CREATININE 0.99 08/29/2020   CREATININE 0.83 04/15/2020   On lisinopril 10. -+ HL; last set of lipids: Lab Results  Component Value Date   CHOL 195 04/15/2020   HDL 50.40 04/15/2020   LDLCALC 66 03/29/2019   LDLDIRECT 113.0 04/15/2020   TRIG 243.0 (H) 04/15/2020   CHOLHDL 4 04/15/2020  On Lipitor, fish oil. - last eye exam was 05/15/2020: No DR - no numbness and tingling in her feet.  On ASA 81.  Pt has FH of DM in brother, sister, father.  + h/o BrCA in 2013, recurrence in 2018.  She is cancer free.    ROS: Constitutional: no weight gain/no weight loss, no fatigue, no subjective hyperthermia, no subjective hypothermia Eyes: no blurry vision, no xerophthalmia ENT: no sore throat, no nodules palpated in neck, no dysphagia, no odynophagia, no hoarseness Cardiovascular: no CP/no SOB/no palpitations/no leg swelling Respiratory: no cough/no SOB/no wheezing Gastrointestinal: no N/no V/no D/no C/no acid reflux Musculoskeletal: no muscle aches/no joint aches Skin: no rashes, no hair loss Neurological: no tremors/no numbness/no tingling/no dizziness  I reviewed pt's medications, allergies, PMH, social hx, family hx, and changes were documented in the history of present illness. Otherwise, unchanged from my initial visit note.  Current Outpatient Medications on File Prior to Visit  Medication Sig  . aspirin 81 MG tablet Take 81 mg by mouth daily as needed (leg pain).   Marland Kitchen atorvastatin (LIPITOR) 10 MG tablet Take 0.5 tablets (5 mg total) by mouth daily.  . BD PEN NEEDLE NANO  2ND GEN 32G X 4 MM MISC USE 1 PEN NEEDLE 2 (TWO) TIMES DAILY.  . benzonatate (TESSALON) 100 MG capsule Take 1 capsule by mouth every 8 (eight) hours for cough.  . Blood Glucose Monitoring Suppl (ONETOUCH VERIO) w/Device KIT Use to check blood sugar 3 times a day. E11.9  . dapagliflozin propanediol (FARXIGA) 10 MG TABS tablet Take 10 mg by mouth daily.  Marland Kitchen gabapentin (NEURONTIN) 300 MG capsule Take 1-2 capsules (300-600 mg total) by mouth at bedtime.  Marland Kitchen glucose blood  (ONETOUCH VERIO) test strip Use to check blood sugar 3 times a day. E11.9  . ibuprofen (ADVIL,MOTRIN) 200 MG tablet Take 400 mg by mouth every 8 (eight) hours as needed for headache or moderate pain.   . Insulin Glargine (BASAGLAR KWIKPEN) 100 UNIT/ML SOPN Inject 0.3 mLs (30 Units total) into the skin at bedtime.  Marland Kitchen letrozole (FEMARA) 2.5 MG tablet Take 1 tablet (2.5 mg total) by mouth daily.  Marland Kitchen lisinopril-hydrochlorothiazide (ZESTORETIC) 20-25 MG tablet Take 1 tablet by mouth once daily  . metFORMIN (GLUCOPHAGE) 1000 MG tablet Take 1 tablet (1,000 mg total) by mouth 2 (two) times daily with a meal.  . NON FORMULARY Peripheral Neuropathy cream from [place]  . Omega-3 Fatty Acids (FISH OIL) 1200 MG CAPS Take 1,200 mg by mouth daily.   Marland Kitchen triamcinolone cream (KENALOG) 0.1 % APPLY 1 APPLICATION TOPICALLY 2 (TWO) TIMES DAILY TO AFFECTED AREAS  . TRUEPLUS LANCETS 33G MISC 1 each by Does not apply route 3 (three) times daily.   No current facility-administered medications on file prior to visit.   Past Medical History:  Diagnosis Date  . Blood transfusion without reported diagnosis   . Breast cancer [facility]) August 2002   Invasive ductal carcinoma Left breast. 2002, 2017  . Diabetes mellitus without complication ([place])   . Family history of malignant neoplasm of ovary   . Family history of pancreatic cancer   . Hypertension    Past Surgical History:  Procedure Laterality Date  . BREAST SURGERY Left 2003  . COLONOSCOPY    . FOOT SURGERY  2000  . INSERTION OF MESH N/A 02/17/2018   Procedure: INSERTION OF MESH;  Surgeon: [name], MD;  Location: [place];  Service: General;  Laterality: N/A;  . MASTECTOMY Left 05/28/2016  . port a cath insertion  05/28/2016  . PORT-A-CATH REMOVAL Right 08/19/2017   Procedure: REMOVAL PORT-A-CATH;  Surgeon: [name], MD;  Location: [place];  Service: General;  Laterality: Right;  . PORTACATH PLACEMENT Right 05/28/2016    Procedure: INSERTION PORT-A-CATH;  Surgeon: [name], MD;  Location: [place];  Service: General;  Laterality: Right;  . TOTAL MASTECTOMY Left 05/28/2016   Procedure: LEFT  MASTECTOMY;  Surgeon: [name], MD;  Location: [place];  Service: General;  Laterality: Left;  . TUBAL LIGATION  22 yrs since  2004.   Bilateral.  . VENTRAL HERNIA REPAIR  02/17/2018   open  . VENTRAL HERNIA REPAIR N/A 02/17/2018   Procedure: OPEN VENTRAL HERNIA REPAIR WITH MESH;  Surgeon: [name], MD;  Location: [place];  Service: General;  Laterality: N/A;   Social History   Socioeconomic History  . Marital status: Divorced    Spouse name: Not on file  . Number of children: Not on file  . Years of education: Not on file  . Highest education level: Not on file  Occupational History  . Not on file  Tobacco Use  . Smoking status: Former Smoker  Packs/day: 0.10    Years: 7.00    Pack years: 0.70    Quit date: 12/01/1975    Years since quitting: 44.9  . Smokeless tobacco: Never Used  Vaping Use  . Vaping Use: Never used  Substance and Sexual Activity  . Alcohol use: No    Alcohol/week: 0.0 standard drinks  . Drug use: No  . Sexual activity: Yes  Other Topics Concern  . Not on file  Social History Narrative  . Not on file   Social Determinants of Health   Financial Resource Strain:   . Difficulty of Paying Living Expenses: Not on file  Food Insecurity:   . Worried About Charity fundraiser in the Last Year: Not on file  . Ran Out of Food in the Last Year: Not on file  Transportation Needs:   . Lack of Transportation (Medical): Not on file  . Lack of Transportation (Non-Medical): Not on file  Physical Activity:   . Days of Exercise per Week: Not on file  . Minutes of Exercise per Session: Not on file  Stress:   . Feeling of Stress : Not on file  Social Connections:   . Frequency of Communication with Friends and Family: Not on file  . Frequency of Social Gatherings with Friends and  Family: Not on file  . Attends Religious Services: Not on file  . Active Member of Clubs or Organizations: Not on file  . Attends Archivist Meetings: Not on file  . Marital Status: Not on file  Intimate Partner Violence:   . Fear of Current or Ex-Partner: Not on file  . Emotionally Abused: Not on file  . Physically Abused: Not on file  . Sexually Abused: Not on file    Allergies  Allergen Reactions  . Codeine Other (See Comments)    "get crazy", "see things that aren't there"   Family History  Problem Relation Age of Onset  . Prostate cancer Father   . Ovarian cancer Maternal Aunt 31       deceased  . Pancreatic cancer Mother 3       deceased  . Cancer Maternal Aunt        2 other mat aunts; unk. primary in 10s; deceased  . Cancer Maternal Uncle        "bone ca"; unk. primary; deceased 28s  . Prostate cancer Maternal Uncle        deceased 38  . Colon cancer Neg Hx   . Esophageal cancer Neg Hx   . Rectal cancer Neg Hx   . Stomach cancer Neg Hx     PE:  BP 132/88   Pulse 100   Ht 5' 8"  (1.727 m)   Wt 226 lb 9.6 oz (102.8 kg)   SpO2 98%   BMI 34.45 kg/m  Wt Readings from Last 3 Encounters:  10/28/20 226 lb 9.6 oz (102.8 kg)  08/29/20 222 lb 11.2 oz (101 kg)  04/23/20 224 lb 6.1 oz (101.8 kg)   Constitutional: overweight, in NAD Eyes: PERRLA, EOMI, no exophthalmos ENT: moist mucous membranes, no thyromegaly, no cervical lymphadenopathy Cardiovascular: tachycardia, RR, No MRG Respiratory: CTA B Gastrointestinal: abdomen soft, NT, ND, BS+ Musculoskeletal: no deformities, strength intact in all 4 Skin: moist, warm, no rashes Neurological: no tremor with outstretched hands, DTR normal in all 4  ASSESSMENT: 1. DM2, insulin-dependent, uncontrolled, without long-term complications, but with hyperglycemia  2. HL  3.  Obesity class II  PLAN:  1. Patient with  longstanding, uncontrolled, type 2 diabetes, on oral antidiabetic regimen with Metformin,  SGLT2 inhibitor, and also long-acting insulin, with still poor control.  Latest HbA1c obtained 6 months ago was 8.4%, improved, but still above target.  In the past, she was able to come off insulin after her sugars improved but we restarted this in 2020.  She was on Januvia in the past but this was expensive so she had to stop.  At last visit, sugars were better especially in the previous few weeks.  She occasionally had blood sugars above goal but most of them are within target range.  She had no low blood sugars.  We did not change her regimen at that time. -At today's visit, her sugars are fluctuating during the day, with many at goal, but probably 2/3 of her sugars being slightly above goal.  We discussed about starting a GLP-1 receptor agonist especially since her insurance changes at the first of the year to [place].  She will let me know at that time if we can start Ozempic or Trulicity.  For now, I did advise him to increase Basaglar slightly. - I suggested to: Patient Instructions  Please continue: - Basaglar 30 units at bedtime - Metformin 1000 mg 2x a day with meals - Farxiga 10 mg daily before b'fast  Please let me know when you start the new insurance and whe we can try to add: - Ozempic - Trulicity  For now, increase Basaglar to 34-36 units, but if we can start one of the above medicines, will need to back off to 30 units again.  Please come back for a follow-up appointment in 3-4 months.  - we checked her HbA1c: 7.9% (better) - advised to check sugars at different times of the day - 1-2x a day, rotating check times - advised for yearly eye exams >> she is UTD - return to clinic in 3-4 months  2. HL -Reviewed latest lipid panel from 03/2020: LDL much higher, now above target, triglycerides also high: Lab Results  Component Value Date   CHOL 195 04/15/2020   HDL 50.40 04/15/2020   LDLCALC 66 03/29/2019   LDLDIRECT 113.0 04/15/2020   TRIG 243.0 (H) 04/15/2020    CHOLHDL 4 04/15/2020  -She was off Lipitor for the above panel and I refilled this at last visit.  She also continues on fish oil.  No side effects.    3.  Obesity class II -We will continue SGLT2 inhibitor which should also help with weight loss -Before last visit, she lost 6 pounds -She gained 2 pounds since last visit  Philemon Kingdom, MD PhD [facility]

## 2020-10-28 NOTE — Patient Instructions (Addendum)
Please continue: - Basaglar 30 units at bedtime - Metformin 1000 mg 2x a day with meals - Farxiga 10 mg daily before b'fast  Please let me know when you start the new insurance and whe we can try to add: - Ozempic - Trulicity  For now, increase Basaglar to 34-36 units, but if we can start one of the above medicines, will need to back off to 30 units again.  Please come back for a follow-up appointment in 3-4 months.

## 2020-11-07 ENCOUNTER — Other Ambulatory Visit: Payer: Self-pay | Admitting: Internal Medicine

## 2020-11-18 ENCOUNTER — Other Ambulatory Visit: Payer: Self-pay

## 2020-11-18 ENCOUNTER — Encounter: Payer: Self-pay | Admitting: Podiatry

## 2020-11-18 ENCOUNTER — Ambulatory Visit: Payer: Medicare HMO | Admitting: Podiatry

## 2020-11-18 DIAGNOSIS — L84 Corns and callosities: Secondary | ICD-10-CM | POA: Diagnosis not present

## 2020-11-18 DIAGNOSIS — M79674 Pain in right toe(s): Secondary | ICD-10-CM | POA: Diagnosis not present

## 2020-11-18 DIAGNOSIS — B351 Tinea unguium: Secondary | ICD-10-CM

## 2020-11-18 DIAGNOSIS — Q828 Other specified congenital malformations of skin: Secondary | ICD-10-CM

## 2020-11-18 DIAGNOSIS — M2041 Other hammer toe(s) (acquired), right foot: Secondary | ICD-10-CM

## 2020-11-18 DIAGNOSIS — E1142 Type 2 diabetes mellitus with diabetic polyneuropathy: Secondary | ICD-10-CM

## 2020-11-18 DIAGNOSIS — M79675 Pain in left toe(s): Secondary | ICD-10-CM | POA: Diagnosis not present

## 2020-11-18 DIAGNOSIS — Z86010 Personal history of colonic polyps: Secondary | ICD-10-CM | POA: Insufficient documentation

## 2020-11-18 DIAGNOSIS — M2042 Other hammer toe(s) (acquired), left foot: Secondary | ICD-10-CM

## 2020-11-24 NOTE — Progress Notes (Signed)
Subjective:  Patient ID: Lindsey Marquez, female    DOB: January 06, 1951,  MRN: [id number]  69 y.o. female presents with at risk foot care with history of diabetic neuropathy, callus(es) b/l feet and painful thick toenails that are difficult to trim. Painful toenails interfere with ambulation. Aggravating factors include wearing enclosed shoe gear. Pain is relieved with periodic professional debridement. Painful calluses are aggravated when weightbearing with and without shoegear. Pain is relieved with periodic professional debridement. and painful porokeratotic lesions b/l feet.  Pain prevent comfortable ambulation. Aggravating factor is weightbearing with or without shoegear..    Patient's blood sugar was 107 mg/dl yesterday morning.  Patient did not check blood glucose this morning.  PCP: [name], NP and last visit was: two months ago per patient recall.  Review of Systems: Negative except as noted in the HPI.  Past Medical History:  Diagnosis Date  . Blood transfusion without reported diagnosis   . Breast cancer [facility]) August 2002   Invasive ductal carcinoma Left breast. 2002, 2017  . Diabetes mellitus without complication ([place])   . Family history of malignant neoplasm of ovary   . Family history of pancreatic cancer   . Hypertension    Past Surgical History:  Procedure Laterality Date  . BREAST SURGERY Left 2003  . COLONOSCOPY    . FOOT SURGERY  2000  . INSERTION OF MESH N/A 02/17/2018   Procedure: INSERTION OF MESH;  Surgeon: [name], MD;  Location: [place];  Service: General;  Laterality: N/A;  . MASTECTOMY Left 05/28/2016  . port a cath insertion  05/28/2016  . PORT-A-CATH REMOVAL Right 08/19/2017   Procedure: REMOVAL PORT-A-CATH;  Surgeon: [name], MD;  Location: [place];  Service: General;  Laterality: Right;  . PORTACATH PLACEMENT Right 05/28/2016   Procedure: INSERTION PORT-A-CATH;  Surgeon: [name], MD;  Location: [place];  Service:  General;  Laterality: Right;  . TOTAL MASTECTOMY Left 05/28/2016   Procedure: LEFT  MASTECTOMY;  Surgeon: [name], MD;  Location: [place];  Service: General;  Laterality: Left;  . TUBAL LIGATION  22 yrs since  2004.   Bilateral.  . VENTRAL HERNIA REPAIR  02/17/2018   open  . VENTRAL HERNIA REPAIR N/A 02/17/2018   Procedure: OPEN VENTRAL HERNIA REPAIR WITH MESH;  Surgeon: [name], MD;  Location: [place];  Service: General;  Laterality: N/A;   Patient Active Problem List   Diagnosis Date Noted  . History of colonic polyps 11/18/2020  . Drug-induced polyneuropathy ([place]) 04/15/2020  . Osteoarthritis 04/15/2020  . Hyperlipidemia 09/20/2018  . Class 2 obesity 09/20/2018  . Cancer of central portion of left breast ([place]) 05/07/2016  . HTN (hypertension) 03/24/2016  . Type 2 diabetes mellitus with hyperglycemia, with long-term current use of insulin ([place]) 03/24/2016    Current Outpatient Medications:  .  aspirin 81 MG tablet, Take 81 mg by mouth daily as needed (leg pain). , Disp: , Rfl:  .  atorvastatin (LIPITOR) 10 MG tablet, Take 0.5 tablets (5 mg total) by mouth daily., Disp: 45 tablet, Rfl: 3 .  BD PEN NEEDLE NANO 2ND GEN 32G X 4 MM MISC, USE 1 PEN NEEDLE 2 (TWO) TIMES DAILY., Disp: 200 each, Rfl: 1 .  benzonatate (TESSALON) 100 MG capsule, Take 1 capsule by mouth every 8 (eight) hours for cough., Disp: 21 capsule, Rfl: 0 .  Blood Glucose Monitoring Suppl (ONETOUCH VERIO) w/Device KIT, Use to check blood sugar 3 times a day. E11.9, Disp: 1 kit,  Rfl: 0 .  dapagliflozin propanediol (FARXIGA) 10 MG TABS tablet, Take 10 mg by mouth daily., Disp: 30 tablet, Rfl: 11 .  gabapentin (NEURONTIN) 300 MG capsule, Take 1-2 capsules (300-600 mg total) by mouth at bedtime., Disp: 120 capsule, Rfl: 1 .  glucose blood (ONETOUCH VERIO) test strip, Use to check blood sugar 3 times a day. E11.9, Disp: 300 each, Rfl: 12 .  ibuprofen (ADVIL,MOTRIN) 200 MG tablet, Take 400 mg by mouth every 8 (eight)  hours as needed for headache or moderate pain. , Disp: , Rfl:  .  Insulin Glargine (BASAGLAR KWIKPEN) 100 UNIT/ML SOPN, Inject 0.3 mLs (30 Units total) into the skin at bedtime., Disp: 15 mL, Rfl: 11 .  letrozole (FEMARA) 2.5 MG tablet, Take 1 tablet (2.5 mg total) by mouth daily., Disp: 90 tablet, Rfl: 3 .  lisinopril-hydrochlorothiazide (ZESTORETIC) 20-25 MG tablet, Take 1 tablet by mouth once daily, Disp: 90 tablet, Rfl: 0 .  metFORMIN (GLUCOPHAGE) 1000 MG tablet, Take 1 tablet (1,000 mg total) by mouth 2 (two) times daily with a meal., Disp: 180 tablet, Rfl: 3 .  NON FORMULARY, Peripheral Neuropathy cream from [place], Disp: , Rfl:  .  Omega-3 Fatty Acids (FISH OIL) 1200 MG CAPS, Take 1,200 mg by mouth daily. , Disp: , Rfl:  .  TRUEPLUS LANCETS 33G MISC, 1 each by Does not apply route 3 (three) times daily., Disp: 600 each, Rfl: 2 Allergies  Allergen Reactions  . Codeine Other (See Comments)    "get crazy", "see things that aren't there"   Social History   Tobacco Use  Smoking Status Former Smoker  . Packs/day: 0.10  . Years: 7.00  . Pack years: 0.70  . Quit date: 12/01/1975  . Years since quitting: 45.0  Smokeless Tobacco Never Used    Objective:  There were no vitals filed for this visit. Constitutional Patient is a pleasant 69 y.o. African American female in NAD. AAO x 3.  Vascular Capillary refill time to digits immediate b/l. Palpable pedal pulses b/l LE. Pedal hair absent. Lower extremity skin temperature gradient within normal limits. No pain with calf compression b/l. No cyanosis or clubbing noted.  Neurologic Normal speech. Pt has subjective symptoms of neuropathy. Protective sensation intact 5/5 intact bilaterally with 10g monofilament b/l. Vibratory sensation intact b/l.  Dermatologic Pedal skin with normal turgor, texture and tone bilaterally. No open wounds bilaterally. No interdigital macerations bilaterally. Toenails 1-5 b/l elongated, discolored,  dystrophic, thickened, crumbly with subungual debris and tenderness to dorsal palpation. Hyperkeratotic lesion(s) submet head 1 left foot and submet head 1 right foot.  No erythema, no edema, no drainage, no fluctuance. Porokeratotic lesion(s) submet head 2 left foot and submet head 4 right foot. No erythema, no edema, no drainage, no fluctuance.  Orthopedic: Normal muscle strength 5/5 to all lower extremity muscle groups bilaterally. No pain crepitus or joint limitation noted with ROM b/l. Hammertoes noted to the 2-5 bilaterally.   Hemoglobin A1C Latest Ref Rng & Units 10/28/2020 04/15/2020  HGBA1C 4.0 - 5.6 % 7.9(A) 8.4(H)  Some recent data might be hidden   Assessment:   1. Pain due to onychomycosis of toenails of both feet   2. Porokeratosis   3. Callus   4. Acquired hammertoes of both feet   5. Diabetic peripheral neuropathy associated with type 2 diabetes mellitus ([place])    Plan:  Patient was evaluated and treated and all questions answered.  Onychomycosis with pain -Nails palliatively debridement as below. -Educated on self-care  Procedure: Nail Debridement Rationale: Pain Type of Debridement: manual, sharp debridement. Instrumentation: Nail nipper, rotary burr. Number of Nails: 10  -Examined patient. -Continue diabetic foot care principles. -Patient to continue soft, supportive shoe gear daily. -Toenails 1-5 b/l were debrided in length and girth with sterile nail nippers and dremel without iatrogenic bleeding.  -Callus(es) submet head 1 left foot and submet head 1 right foot pared utilizing sterile scalpel blade without complication or incident. Total number debrided =2. -Painful porokeratotic lesion(s) submet head 2 left foot and submet head 4 right foot pared and enucleated with sterile scalpel blade without incident. -Patient to report any pedal injuries to medical professional immediately. -Patient/POA to call should there be question/concern in the interim.  Return in  about 3 months (around 02/16/2021) for diabetic nail and callus trim.  Marzetta Board, DPM

## 2020-12-19 ENCOUNTER — Telehealth: Payer: Self-pay

## 2020-12-19 NOTE — Telephone Encounter (Signed)
I spoke with Ms Sweatman and let her know the jury excusal letter was complete. The original letter and jury form have been mailed to the court.  I also mailed a copy of the letter and court document to Ms Daffin

## 2020-12-26 ENCOUNTER — Other Ambulatory Visit: Payer: Self-pay | Admitting: Nurse Practitioner

## 2020-12-26 DIAGNOSIS — C50112 Malignant neoplasm of central portion of left female breast: Secondary | ICD-10-CM

## 2021-01-02 ENCOUNTER — Other Ambulatory Visit: Payer: Self-pay | Admitting: Internal Medicine

## 2021-01-15 ENCOUNTER — Telehealth: Payer: Self-pay | Admitting: *Deleted

## 2021-01-15 NOTE — Telephone Encounter (Signed)
Caryl Pina with [employer] left a voicemail stating that the patient has enrolled in their special needs program. Caryl Pina stated that they faxed over a form yesterday to be completed and faxed back. Caryl Pina stated that they need a confirmed diagnosis of diabetes or chronic heart failure. Caryl Pina is requesting that the form be faxed back as soon as possible so that patient will not be dis-enrolled in the program. Caryl Pina stated that you can also call and give a verbal diagnosis.

## 2021-01-15 NOTE — Telephone Encounter (Cosign Needed)
I will not be in the office until Thursday. Ok to give verbal order for DM 2 with polyneuropathy.

## 2021-01-15 NOTE — Telephone Encounter (Signed)
VO with Dx given to [facility] [employer], reported nothing further needed

## 2021-02-17 ENCOUNTER — Ambulatory Visit (INDEPENDENT_AMBULATORY_CARE_PROVIDER_SITE_OTHER): Payer: HMO | Admitting: Podiatry

## 2021-02-17 ENCOUNTER — Other Ambulatory Visit: Payer: Self-pay

## 2021-02-17 DIAGNOSIS — M2042 Other hammer toe(s) (acquired), left foot: Secondary | ICD-10-CM

## 2021-02-17 DIAGNOSIS — M79674 Pain in right toe(s): Secondary | ICD-10-CM | POA: Diagnosis not present

## 2021-02-17 DIAGNOSIS — M79675 Pain in left toe(s): Secondary | ICD-10-CM

## 2021-02-17 DIAGNOSIS — E1142 Type 2 diabetes mellitus with diabetic polyneuropathy: Secondary | ICD-10-CM | POA: Diagnosis not present

## 2021-02-17 DIAGNOSIS — Z1211 Encounter for screening for malignant neoplasm of colon: Secondary | ICD-10-CM | POA: Insufficient documentation

## 2021-02-17 DIAGNOSIS — B351 Tinea unguium: Secondary | ICD-10-CM

## 2021-02-17 DIAGNOSIS — Q828 Other specified congenital malformations of skin: Secondary | ICD-10-CM

## 2021-02-17 DIAGNOSIS — M2041 Other hammer toe(s) (acquired), right foot: Secondary | ICD-10-CM

## 2021-02-21 NOTE — Progress Notes (Signed)
[place]   [phone] Fax:([phone]) [phone]   Clinic Follow up Note   Patient Care Team: [name], NP as PCP - General (Internal Medicine) [name], MD as Consulting Physician (General Surgery) [name], MD as Consulting Physician (Hematology) Delice Bison Charlestine Massed, NP as Nurse Practitioner (Hematology and Oncology)  Date of Service:  02/26/2021  CHIEF COMPLAINT: Follow up recurrent left breast cancer  SUMMARY OF ONCOLOGIC HISTORY: Oncology History Overview Note  Cancer of central portion of left breast [facility])   Staging form: Breast, AJCC 7th Edition     Clinical stage from 04/17/2016: Stage IA (T1c, N0, M0) - Signed by [name], MD on 05/07/2016     Pathologic stage from 05/28/2016: Stage IIA (T2, N0, cM0) - Signed by [name], MD on 06/11/2016 History of left breast cancer   Staging form: Breast, AJCC 7th Edition     Clinical: Stage IIIB (T4d, N1, cM0) - Signed by [name], MD on 12/14/2013     Pathologic: Stage IIIB (T4d, N1a, cM0) - Signed by [name], MD on 12/14/2013     History of left breast cancer (Resolved)  09/12/2002 Imaging   CT scan show no evidence of disease apart from the left axilla   09/2002 -  Neo-Adjuvant Chemotherapy   TAC X4 cycles    01/10/2003 Surgery   The patient had left lumpectomy and axillary lymph node dissection which show residual breast cancer and a sentinel lymph node was positive   2004 -  Adjuvant Chemotherapy   Cytoxan with 5-FU x4 cycles (methotrexate added at cycle 4).   2004 -  Radiation Therapy   Adjuvant left breast radiation   12/2002 Receptors her2   ER, PR and HER-2 were all negative.   Cancer of central portion of left breast ([place])  04/13/2016 Mammogram   Scheduler coarse heterogeneous calcifications spanning an area of 3.7 cm in the lumpectomy site, indeterminate. Ultrasound of the left axilla was negative.   04/17/2016 Initial Diagnosis   Cancer of central portion of left breast  ([place])   04/17/2016 Initial Biopsy   Left breast core needle biopsy showed invasive and in situ ductal carcinoma with calcification, grade 2.   04/17/2016 Receptors her2   ER 100% positive, PR 15% positive, strong staining, Ki-67 10%, HER-2 positive by IHC (3)   05/05/2016 Imaging   Bilateral breast MRI showed a 1.3 x 0.8 x 0.7 cm biopsy-proven ductal carcinoma in the region of the previous lumpectomy. Enlarged lobulated right inferior axillary lymph node with diffuse cortical thickening, biopsy is recommended.   05/15/2016 Pathology Results   right axilary node biopsy was negative for malignant cell    05/28/2016 Surgery   Simple left mastectomy   05/28/2016 Pathology Results   Left mastectomy showed invasive ductal carcinoma, grade 3, 3.6 cm, high grade DCIS, (+) LVI, surgical margins were negative. Tumor 3.6 cm, no lymph nodes identified.   06/25/2016 Imaging   Staging CT scan of the chest, abdomen and pelvis with contrast and a bone scan showed no evidence of metastasis. Postoperative fluid collection in the left breast, likely a seroma or hematoma. Right axillary adenopathy, which was biopsied previously.    07/07/2016 - 07/15/2017 Chemotherapy   Adjuvant chemotherapy with docetaxel, carboplatin, Herceptin and pejeta every 3 weeks, for 6 cycles, followed by Herceptin maintenance therapy for total of one year treatment  Ended [facility] 11/04/16     12/15/2016 -  Anti-estrogen oral therapy   Adjuvant letrozole 2.5 mg once  daily   04/29/2017 Mammogram    Mammogram 04/29/2017 IMPRESSION: No mammographic evidence of malignancy. A result letter of this screening mammogram will be mailed directly to the patient.    08/26/2017 - 07/2018 Antibody Plan   She started Nerlynx 248m on 08/26/17, was held on 09/14/17 due to significant diarrhea and restarted when her diarrhea resolved on 09/21/17. Completed in 07/2018   09/22/2017 Procedure   Colonoscopy by Dr. NSilverio Decamp IMPRESSION: - One 3 mm polyp in  the transverse colon, removed with a cold biopsy forceps. Resected and retrieved. - Non-bleeding internal hemorrhoids.   09/22/2017 Pathology Results   Diagnosis, colonoscopy  Surgical [P], transverse, polyp - TUBULAR ADENOMA(S). - HIGH GRADE DYSPLASIA IS NOT IDENTIFIED.      CURRENT THERAPY:  Letrozole 2.5 mg daily, started on 12/15/2016  INTERVAL HISTORY:  Lindsey SIMMSis here for a follow up of left breast cancer. She was last seen by me 1 year ago and seen by NP Lacie 6 months ago in interim. She presents to the clinic alone. She notes she is doing well. She denies any new major changes in the last 6 months. She notes she is tolerating Letrozole well with manageable hot flashes. This has not impacted her sleep. She does note joint pain and stiffness, which is managed with Tylenol Arthritis as needed. This is baseline for her and did not significantly worsen on Letrozole. She denies nay new major pain. Her appetite is adequate. She continues to monitor her BG at home. I reviewed her medication list with her, with no changes. She notes history of anemia. She notes she has been fatigued recently.     REVIEW OF SYSTEMS:   Constitutional: Denies fevers, chills or abnormal weight loss (+) Tolerable hot flashes (+) Fatigue  Eyes: Denies blurriness of vision Ears, nose, mouth, throat, and face: Denies mucositis or sore throat Respiratory: Denies cough, dyspnea or wheezes Cardiovascular: Denies palpitation, chest discomfort or lower extremity swelling Gastrointestinal:  Denies nausea, heartburn or change in bowel habits Skin: Denies abnormal skin rashes MSK: (+) Manageable joint pain and stiffness  Lymphatics: Denies new lymphadenopathy or easy bruising Neurological:Denies numbness, tingling or new weaknesses Behavioral/Psych: Mood is stable, no new changes  All other systems were reviewed with the patient and are negative.  MEDICAL HISTORY:  Past Medical History:  Diagnosis Date    Blood transfusion without reported diagnosis    Breast cancer [facility] August 2002   Invasive ductal carcinoma Left breast. 2002, 2017   Diabetes mellitus without complication ([place]    Family history of malignant neoplasm of ovary    Family history of pancreatic cancer    Hypertension     SURGICAL HISTORY: Past Surgical History:  Procedure Laterality Date   BREAST SURGERY Left 2003   COLONOSCOPY     FOOT SURGERY  2000   INSERTION OF MESH N/A 02/17/2018   Procedure: INSERTION OF MESH;  Surgeon: TDonnie Mesa MD;  [place]  Service: General;  Laterality: N/A;   MASTECTOMY Left 05/28/2016   port a cath insertion  05/28/2016   PORT-A-CATH REMOVAL Right 08/19/2017   Procedure: REMOVAL PORT-A-CATH;  Surgeon: TDonnie Mesa MD;  [place]  Service: General;  Laterality: Right;   PORTACATH PLACEMENT Right 05/28/2016   Procedure: INSERTION PORT-A-CATH;  Surgeon: MDonnie Mesa MD;  [place]  Service: General;  Laterality: Right;   TOTAL MASTECTOMY Left 05/28/2016   Procedure: LEFT  MASTECTOMY;  Surgeon: MDonnie Mesa MD;  Location:  [place] [facility];  Service: General;  Laterality: Left;   TUBAL LIGATION  22 yrs since  2004.   Bilateral.   VENTRAL HERNIA REPAIR  02/17/2018   open   VENTRAL HERNIA REPAIR N/A 02/17/2018   Procedure: OPEN VENTRAL HERNIA REPAIR WITH MESH;  Surgeon: [name], MD;  Location: [place];  Service: General;  Laterality: N/A;    I have reviewed the social history and family history with the patient and they are unchanged from previous note.  ALLERGIES:  is allergic to codeine.  MEDICATIONS:  Current Outpatient Medications  Medication Sig Dispense Refill   aspirin 81 MG tablet Take 81 mg by mouth daily as needed (leg pain).      ASPIRIN LOW DOSE PO      atorvastatin (LIPITOR) 10 MG tablet Take 0.5 tablets (5 mg total) by mouth daily. 45 tablet 3   BD PEN NEEDLE NANO 2ND GEN 32G X 4 MM MISC USE 1 PEN NEEDLE  2 (TWO) TIMES DAILY. 200 each 1   benzonatate (TESSALON) 100 MG capsule Take 1 capsule by mouth every 8 (eight) hours for cough. 21 capsule 0   Blood Glucose Monitoring Suppl (ONETOUCH VERIO) w/Device KIT Use to check blood sugar 3 times a day. E11.9 1 kit 0   dapagliflozin propanediol (FARXIGA) 10 MG TABS tablet Take 10 mg by mouth daily. 30 tablet 11   Fish Oil-Cholecalciferol (FISH OIL + D3 PO)      gabapentin (NEURONTIN) 300 MG capsule TAKE 1 TO 2 CAPSULES BY MOUTH AT BEDTIME 120 capsule 2   glucose blood (ONETOUCH VERIO) test strip Use to check blood sugar 3 times a day. E11.9 300 each 12   GLYBURIDE-METFORMIN PO      ibuprofen (ADVIL,MOTRIN) 200 MG tablet Take 400 mg by mouth every 8 (eight) hours as needed for headache or moderate pain.      Insulin Glargine (BASAGLAR KWIKPEN) 100 UNIT/ML INJECT 20 UNITS SUBCUTANEOUSLY AT BEDTIME 15 mL 3   letrozole (FEMARA) 2.5 MG tablet Take 1 tablet (2.5 mg total) by mouth daily. 90 tablet 3   lisinopril-hydrochlorothiazide (ZESTORETIC) 20-25 MG tablet Take 1 tablet by mouth once daily 90 tablet 0   metFORMIN (GLUCOPHAGE) 1000 MG tablet Take 1 tablet (1,000 mg total) by mouth 2 (two) times daily with a meal. 180 tablet 3   NON FORMULARY Peripheral Neuropathy cream from [employer] (FISH OIL) 1200 MG CAPS Take 1,200 mg by mouth daily.      TRUEPLUS LANCETS 33G MISC 1 each by Does not apply route 3 (three) times daily. 600 each 2   No current facility-administered medications for this visit.    PHYSICAL EXAMINATION: ECOG PERFORMANCE STATUS: 1 - Symptomatic but completely ambulatory  Vitals:   02/26/21 1137  BP: (!) 172/77  Pulse: 96  Resp: 20  Temp: (!) 97.4 F (36.3 C)  SpO2: 98%   Filed Weights   02/26/21 1137  Weight: 230 lb 6.4 oz (104.5 kg)    GENERAL:alert, no distress and comfortable SKIN: skin color, texture, turgor are normal, no rashes or significant lesions EYES: normal,  Conjunctiva are pink and non-injected, sclera clear  NECK: supple, thyroid normal size, non-tender, without nodularity LYMPH:  no palpable lymphadenopathy in the cervical, axillary  LUNGS: clear to auscultation and percussion with normal breathing effort HEART: regular rate & rhythm and no murmurs and no lower extremity edema ABDOMEN:abdomen soft, non-tender and normal bowel sounds, no hepatomegaly  Musculoskeletal:no cyanosis of  digits and no clubbing  NEURO: alert & oriented x 3 with fluent speech, no focal motor/sensory deficits BREAST: S/p left mastectomy with breast surgically abscent: Surgical incision healed well with mild scar tissue in breast and axially incision. No palpable mass, nodules or adenopathy bilaterally. Breast exam benign.   LABORATORY DATA:  I have reviewed the data as listed CBC Latest Ref Rng & Units 02/26/2021 08/29/2020 04/15/2020  WBC 4.0 - 10.5 K/uL 8.7 9.8 7.9  Hemoglobin 12.0 - 15.0 g/dL 11.5(L) 11.7(L) 12.1  Hematocrit 36.0 - 46.0 % 34.7(L) 35.8(L) 34.7(L)  Platelets 150 - 400 K/uL 347 317 347.0     CMP Latest Ref Rng & Units 08/29/2020 04/15/2020 03/01/2020  Glucose 70 - 99 mg/dL 168(H) 222(H) 157(H)  BUN 8 - 23 mg/dL 18 17 18   Creatinine 0.44 - 1.00 mg/dL 0.99 0.83 0.96  Sodium 135 - 145 mmol/L 138 137 139  Potassium 3.5 - 5.1 mmol/L 4.1 4.3 4.1  Chloride 98 - 111 mmol/L 103 100 104  CO2 22 - 32 mmol/L 27 26 23   Calcium 8.9 - 10.3 mg/dL 10.1 9.9 9.6  Total Protein 6.5 - 8.1 g/dL 8.1 7.3 8.1  Total Bilirubin 0.3 - 1.2 mg/dL 0.3 0.4 0.5  Alkaline Phos 38 - 126 U/L 70 71 72  AST 15 - 41 U/L 19 24 34  ALT 0 - 44 U/L 21 26 36      RADIOGRAPHIC STUDIES: I have personally reviewed the radiological images as listed and agreed with the findings in the report. No results found.   ASSESSMENT & PLAN:  Lindsey Marquez is a 70 y.o. female with    1. 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 She was treated with Neo-adjuvant chemo, left breast lumpectomy, adjuvant chemo and Radiation.  -She had another left breast cancer in 03/2016. She wastreatedwith left mastectomy, adjuvant chemo TCHP and Nerlynx.  -She started letrozole in 11/2016.Toleratingwell with manageable hot flashes and stable joint pain. Plan to continue up to 10 years.  -She is clinically doing well. Lab reviewed, her CBC and CMP are within normal limits except Hg 11.5. Her physical exam and her 06/2020 right breast mammogram were unremarkable. There is no clinical concern for recurrence. -Continue surveillance. Next mammogram in 06/2021.  -Continue Letrozole  -Lab and f/u in 6 months    2. Mild Anemia  -She notes h/o anemia -Hg in the past 6 months has been mild. Hg at 11.5 today (02/26/21).  -I recommend furhter anemia work up. I discussed this could be anemia of chronic disease from her DM and HTN. She agreed to more lab work.  -Her 10/20218 Colonoscopy with Dr Silverio Decamp showed hemorrhoids and benign tubular adenoma polyps were removed. I discussed this is usually repeated in 3 years. I will refer her back. She denies recent bloody or black stool  3. DM and HTN, Obesity  -Her HTN is moderately controlled and her DM is not well controlled. She continues to monitor at home.  -She was previously counseled on eating healthy and exercising regularly, she is willing to lose some weight after she completes chemotherapy. -BP at 172/77, BG 168 today (02/26/21) -Continue medication and f/u with PCP for management.   4. Neuropathy on legs and feet -She has neuropathy of feet and legs with tightnessand tingling, likely from her DMand previous chemo.She will continue to wear compression socks.  -She is on Gabapentin 329m nightly currently.  -  Not mentioned today, likely stable or improved.   5. Bone Health -We previously discussed that Letrozole can cause some bone weakening  -Her 03/2017 DEXA  showed Femur Neck Right T-score is -0.8. This patient is considered normal. Her DEXA from 05/2019 shows improvement with lowest t-score 0.8 at [facility]. Will repeat in 2022 or 2023 -I encouraged her to take calcium and vitamin D. We'll monitor her Vit D levels  Plan -Refer back to Dr Silverio Decamp for colonoscopy -Lab today for anemia workup  -Continue letrozole daily  -Mammogram in 06/2021 -Lab and f/u in 6 months   No problem-specific Assessment & Plan notes found for this encounter.   Orders Placed This Encounter  Procedures   MM Digital Screening Unilat R    Standing Status:   Future    Standing Expiration Date:   02/26/2022    Order Specific Question:   Reason for Exam (SYMPTOM  OR DIAGNOSIS REQUIRED)    Answer:   screening    Order Specific Question:   Preferred imaging location?    Answer:   [facility] Panel    Standing Status:   Future    Standing Expiration Date:   02/26/2022   Folate RBC    Standing Status:   Future    Standing Expiration Date:   02/26/2022   Iron and TIBC    Standing Status:   Future    Standing Expiration Date:   02/26/2022   Ferritin    Standing Status:   Future    Standing Expiration Date:   02/26/2022   Vitamin B12    Standing Status:   Future    Standing Expiration Date:   02/26/2022   Methylmalonic acid, serum    Standing Status:   Future    Standing Expiration Date:   02/26/2022   All questions were answered. The patient knows to call the clinic with any problems, questions or concerns. No barriers to learning was detected. The total time spent in the appointment was 30 minutes.     [name], MD 02/26/2021   I, Joslyn Devon, am acting as scribe for [name], MD.   I have reviewed the above documentation for accuracy and completeness, and I agree with the above.

## 2021-02-22 ENCOUNTER — Encounter: Payer: Self-pay | Admitting: Podiatry

## 2021-02-22 NOTE — Progress Notes (Signed)
  Subjective:  Patient ID: Lindsey Marquez, female    DOB: Jun 07, 1951,  MRN: [id number]  70 y.o. female presents with at risk foot care with history of diabetic neuropathy, callus(es) b/l feet and painful thick toenails that are difficult to trim. Painful toenails interfere with ambulation. Aggravating factors include wearing enclosed shoe gear. Pain is relieved with periodic professional debridement. Painful calluses are aggravated when weightbearing with and without shoegear. Pain is relieved with periodic professional debridement. and painful porokeratotic lesions b/l feet.  Pain prevent comfortable ambulation. Aggravating factor is weightbearing with or without shoegear.  Patient's blood sugar was 112 mg/dl yesterday morning.  PCP: [name], NP and last visit was: two months ago per patient recall.  Review of Systems: Negative except as noted in the HPI.   Allergies  Allergen Reactions  . Codeine Other (See Comments)    "get crazy", "see things that aren't there"    Objective:  There were no vitals filed for this visit. Constitutional Patient is a pleasant 70 y.o. African American female in NAD. AAO x 3.  Vascular Capillary refill time to digits immediate b/l. Palpable pedal pulses b/l LE. Pedal hair absent. Lower extremity skin temperature gradient within normal limits. No pain with calf compression b/l. No cyanosis or clubbing noted.  Neurologic Normal speech. Pt has subjective symptoms of neuropathy. Protective sensation intact 5/5 intact bilaterally with 10g monofilament b/l. Vibratory sensation intact b/l.  Dermatologic Pedal skin with normal turgor, texture and tone bilaterally. No open wounds bilaterally. No interdigital macerations bilaterally. Toenails 1-5 b/l elongated, discolored, dystrophic, thickened, crumbly with subungual debris and tenderness to dorsal palpation. Hyperkeratotic lesion(s) submet head 1 left foot and submet head 1 right foot.  No erythema, no edema, no  drainage, no fluctuance. Porokeratotic lesion(s) submet head 2 left foot and submet head 4 right foot. No erythema, no edema, no drainage, no fluctuance.  Orthopedic: Normal muscle strength 5/5 to all lower extremity muscle groups bilaterally. No pain crepitus or joint limitation noted with ROM b/l. Hammertoes noted to the 2-5 bilaterally.   Hemoglobin A1C Latest Ref Rng & Units 10/28/2020 04/15/2020  HGBA1C 4.0 - 5.6 % 7.9(A) 8.4(H)  Some recent data might be hidden   Assessment:   1. Pain due to onychomycosis of toenails of both feet   2. Porokeratosis   3. Acquired hammertoes of both feet   4. Diabetic peripheral neuropathy associated with type 2 diabetes mellitus ([place])    Plan:  Patient was evaluated and treated and all questions answered.  Onychomycosis with pain -Nails palliatively debridement as below. -Educated on self-care  Procedure: Nail Debridement Rationale: Pain Type of Debridement: manual, sharp debridement. Instrumentation: Nail nipper, rotary burr. Number of Nails: 10  -Examined patient. -Continue diabetic foot care principles. -Patient to continue soft, supportive shoe gear daily. -Toenails 1-5 b/l were debrided in length and girth with sterile nail nippers and dremel without iatrogenic bleeding.  -Callus(es) submet head 1 left foot and submet head 1 right foot pared utilizing sterile scalpel blade without complication or incident. Total number debrided =2. -Painful porokeratotic lesion(s) submet head 2 left foot and submet head 4 right foot pared and enucleated with sterile scalpel blade without incident. -Patient to report any pedal injuries to medical professional immediately. -Patient/POA to call should there be question/concern in the interim.  Return in about 3 months (around 05/20/2021).  Marzetta Board, DPM

## 2021-02-26 ENCOUNTER — Inpatient Hospital Stay: Payer: HMO

## 2021-02-26 ENCOUNTER — Inpatient Hospital Stay: Payer: HMO | Attending: Hematology

## 2021-02-26 ENCOUNTER — Inpatient Hospital Stay (HOSPITAL_BASED_OUTPATIENT_CLINIC_OR_DEPARTMENT_OTHER): Payer: HMO | Admitting: Hematology

## 2021-02-26 ENCOUNTER — Other Ambulatory Visit: Payer: Self-pay

## 2021-02-26 ENCOUNTER — Telehealth: Payer: Self-pay | Admitting: Hematology

## 2021-02-26 VITALS — BP 172/77 | HR 96 | Temp 97.4°F | Resp 20 | Ht 68.0 in | Wt 230.4 lb

## 2021-02-26 DIAGNOSIS — Z1231 Encounter for screening mammogram for malignant neoplasm of breast: Secondary | ICD-10-CM

## 2021-02-26 DIAGNOSIS — Z170 Estrogen receptor positive status [ER+]: Secondary | ICD-10-CM | POA: Insufficient documentation

## 2021-02-26 DIAGNOSIS — C50112 Malignant neoplasm of central portion of left female breast: Secondary | ICD-10-CM

## 2021-02-26 DIAGNOSIS — E1165 Type 2 diabetes mellitus with hyperglycemia: Secondary | ICD-10-CM

## 2021-02-26 DIAGNOSIS — E119 Type 2 diabetes mellitus without complications: Secondary | ICD-10-CM | POA: Diagnosis not present

## 2021-02-26 DIAGNOSIS — D649 Anemia, unspecified: Secondary | ICD-10-CM

## 2021-02-26 DIAGNOSIS — Z923 Personal history of irradiation: Secondary | ICD-10-CM | POA: Insufficient documentation

## 2021-02-26 DIAGNOSIS — Z794 Long term (current) use of insulin: Secondary | ICD-10-CM | POA: Insufficient documentation

## 2021-02-26 DIAGNOSIS — Z79811 Long term (current) use of aromatase inhibitors: Secondary | ICD-10-CM | POA: Diagnosis not present

## 2021-02-26 DIAGNOSIS — Z9221 Personal history of antineoplastic chemotherapy: Secondary | ICD-10-CM | POA: Diagnosis not present

## 2021-02-26 DIAGNOSIS — G629 Polyneuropathy, unspecified: Secondary | ICD-10-CM | POA: Insufficient documentation

## 2021-02-26 DIAGNOSIS — Z9012 Acquired absence of left breast and nipple: Secondary | ICD-10-CM | POA: Diagnosis not present

## 2021-02-26 DIAGNOSIS — Z800 Family history of malignant neoplasm of digestive organs: Secondary | ICD-10-CM | POA: Insufficient documentation

## 2021-02-26 DIAGNOSIS — Z79899 Other long term (current) drug therapy: Secondary | ICD-10-CM | POA: Diagnosis not present

## 2021-02-26 DIAGNOSIS — I10 Essential (primary) hypertension: Secondary | ICD-10-CM | POA: Diagnosis not present

## 2021-02-26 LAB — COMPREHENSIVE METABOLIC PANEL
ALT: 29 U/L (ref 0–44)
AST: 25 U/L (ref 15–41)
Albumin: 4 g/dL (ref 3.5–5.0)
Alkaline Phosphatase: 67 U/L (ref 38–126)
Anion gap: 11 (ref 5–15)
BUN: 26 mg/dL — ABNORMAL HIGH (ref 8–23)
CO2: 25 mmol/L (ref 22–32)
Calcium: 9.5 mg/dL (ref 8.9–10.3)
Chloride: 102 mmol/L (ref 98–111)
Creatinine, Ser: 1.07 mg/dL — ABNORMAL HIGH (ref 0.44–1.00)
GFR, Estimated: 56 mL/min — ABNORMAL LOW (ref >60)
Glucose, Bld: 135 mg/dL — ABNORMAL HIGH (ref 70–99)
Potassium: 4.3 mmol/L (ref 3.5–5.1)
Sodium: 138 mmol/L (ref 135–145)
Total Bilirubin: 0.4 mg/dL (ref 0.3–1.2)
Total Protein: 8.3 g/dL — ABNORMAL HIGH (ref 6.5–8.1)

## 2021-02-26 LAB — CBC WITH DIFFERENTIAL (CANCER CENTER ONLY)
Abs Immature Granulocytes: 0.02 10*3/uL (ref 0.00–0.07)
Basophils Absolute: 0 10*3/uL (ref 0.0–0.1)
Basophils Relative: 1 %
Eosinophils Absolute: 0.2 10*3/uL (ref 0.0–0.5)
Eosinophils Relative: 2 %
HCT: 34.7 % — ABNORMAL LOW (ref 36.0–46.0)
Hemoglobin: 11.5 g/dL — ABNORMAL LOW (ref 12.0–15.0)
Immature Granulocytes: 0 %
Lymphocytes Relative: 31 %
Lymphs Abs: 2.7 10*3/uL (ref 0.7–4.0)
MCH: 33.3 pg (ref 26.0–34.0)
MCHC: 33.1 g/dL (ref 30.0–36.0)
MCV: 100.6 fL — ABNORMAL HIGH (ref 80.0–100.0)
Monocytes Absolute: 0.6 10*3/uL (ref 0.1–1.0)
Monocytes Relative: 7 %
Neutro Abs: 5.2 10*3/uL (ref 1.7–7.7)
Neutrophils Relative %: 59 %
Platelet Count: 347 10*3/uL (ref 150–400)
RBC: 3.45 MIL/uL — ABNORMAL LOW (ref 3.87–5.11)
RDW: 11.9 % (ref 11.5–15.5)
WBC Count: 8.7 10*3/uL (ref 4.0–10.5)
nRBC: 0 % (ref 0.0–0.2)

## 2021-02-26 LAB — RETIC PANEL
Immature Retic Fract: 5.9 % (ref 2.3–15.9)
RBC.: 3.44 MIL/uL — ABNORMAL LOW (ref 3.87–5.11)
Retic Count, Absolute: 49.5 10*3/uL (ref 19.0–186.0)
Retic Ct Pct: 1.4 % (ref 0.4–3.1)
Reticulocyte Hemoglobin: 37.8 pg (ref >27.9)

## 2021-02-26 LAB — VITAMIN B12: Vitamin B-12: 359 pg/mL (ref 180–914)

## 2021-02-26 LAB — IRON AND TIBC
Iron: 107 ug/dL (ref 41–142)
Saturation Ratios: 28 % (ref 21–57)
TIBC: 388 ug/dL (ref 236–444)
UIBC: 280 ug/dL (ref 120–384)

## 2021-02-26 LAB — FERRITIN: Ferritin: 140 ng/mL (ref 11–307)

## 2021-02-26 NOTE — Telephone Encounter (Signed)
Scheduled follow-up appointments per 3/30 los. Patient is aware. 

## 2021-02-27 ENCOUNTER — Encounter: Payer: Self-pay | Admitting: Hematology

## 2021-02-27 LAB — FOLATE RBC
Folate, Hemolysate: 358 ng/mL
Folate, RBC: 1041 ng/mL (ref >498)
Hematocrit: 34.4 % (ref 34.0–46.6)

## 2021-03-03 ENCOUNTER — Telehealth: Payer: Self-pay

## 2021-03-03 LAB — METHYLMALONIC ACID, SERUM: Methylmalonic Acid, Quantitative: 222 nmol/L (ref 0–378)

## 2021-03-03 NOTE — Telephone Encounter (Signed)
-----   Message from [name], MD sent at 03/02/2021  9:28 AM EDT ----- Please let pt know her lab results. Her iron, folate and B12 level were WNL, her anemia is probably related to chronic disease (DM), will continue monitoring.   [name] 03/02/2021

## 2021-03-03 NOTE — Telephone Encounter (Signed)
Spoke with pt concerning most recent labs no new orders f/u as scheduled

## 2021-03-04 ENCOUNTER — Encounter: Payer: Self-pay | Admitting: Internal Medicine

## 2021-03-04 ENCOUNTER — Ambulatory Visit (INDEPENDENT_AMBULATORY_CARE_PROVIDER_SITE_OTHER): Payer: HMO | Admitting: Internal Medicine

## 2021-03-04 ENCOUNTER — Other Ambulatory Visit: Payer: Self-pay | Admitting: Internal Medicine

## 2021-03-04 ENCOUNTER — Other Ambulatory Visit: Payer: Self-pay

## 2021-03-04 VITALS — BP 140/82 | HR 107 | Ht 68.0 in | Wt 231.8 lb

## 2021-03-04 DIAGNOSIS — E66812 Obesity, class 2: Secondary | ICD-10-CM

## 2021-03-04 DIAGNOSIS — E1165 Type 2 diabetes mellitus with hyperglycemia: Secondary | ICD-10-CM | POA: Diagnosis not present

## 2021-03-04 DIAGNOSIS — E785 Hyperlipidemia, unspecified: Secondary | ICD-10-CM | POA: Diagnosis not present

## 2021-03-04 DIAGNOSIS — E669 Obesity, unspecified: Secondary | ICD-10-CM

## 2021-03-04 DIAGNOSIS — Z794 Long term (current) use of insulin: Secondary | ICD-10-CM

## 2021-03-04 LAB — POCT GLYCOSYLATED HEMOGLOBIN (HGB A1C): Hemoglobin A1C: 9.1 % — AB (ref 4.0–5.6)

## 2021-03-04 MED ORDER — DAPAGLIFLOZIN PROPANEDIOL 10 MG PO TABS
10.0000 mg | ORAL_TABLET | Freq: Every day | ORAL | 3 refills | Status: DC
Start: 2021-03-04 — End: 2021-09-17

## 2021-03-04 MED ORDER — FREESTYLE LIBRE 2 SENSOR MISC: 1.0000 | 3 refills | Status: DC

## 2021-03-04 MED ORDER — BASAGLAR KWIKPEN 100 UNIT/ML ~~LOC~~ SOPN: PEN_INJECTOR | SUBCUTANEOUS | 3 refills | Status: DC

## 2021-03-04 MED ORDER — FREESTYLE LIBRE 2 READER DEVI: 1.0000 | Freq: Every day | 0 refills | Status: DC

## 2021-03-04 NOTE — Progress Notes (Signed)
Patient ID: Lindsey Marquez, female   DOB: 10-05-1951, 70 y.o.   MRN: [id number]   This visit occurred during the SARS-CoV-2 public health emergency.  Safety protocols were in place, including screening questions prior to the visit, additional usage of staff PPE, and extensive cleaning of exam room while observing appropriate contact time as indicated for disinfecting solutions.   HPI: Lindsey Marquez is a 70 y.o.-year-old female, returning for follow-up for DM2, dx in ~2001, insulin-dependent, uncontrolled, without long-term complications. Last visit 4 months ago.  Interim history: Since last visit, it appears that he ran out of [place] and is also using lower doses of Basaglar than prescribed.  She also forgot to discuss with insurance about which GLP-1 receptor agonist are affordable.  She did talk to them about a CGM and it appears that the freestyle libre CGM is covered. Otherwise, she has no complaints today  Reviewed HbA1c levels: Lab Results  Component Value Date   HGBA1C 7.9 (A) 10/28/2020   HGBA1C 8.4 (H) 04/15/2020   HGBA1C 10.4 (A) 10/04/2019   Pt is on: - Basaglar 20 >> 30 >> 34-36 units at bedtime - Metformin 1000 mg 2x a day with meals  - Farxiga 10 mg daily before b'fast >> apparently ran out GLP1 R agonists were not affordable.  Pt checks her sugars once a day: - am: 108-151, 164, 186 >> 107-150, 155, 188 >> 116-144, 167 - 2h after b'fast: n/c - before lunch:175-200 >> 205 >> n/c >> 110-178, 183 >> 125-142 - 2h after lunch: n/c >> 344 >> n/c - before dinner: 180-195 >> 200s >> 103-145, 174 >> n/c - 2h after dinner: n/c >> 120-160, 186, 192 >> 112-191, 216 >> 124-156 - bedtime: 244, 266 >> 178-200 >> n/c  - nighttime: n/c Lowest sugar was 197 >> 108 >> 103 >> 116; she has hypoglycemia awareness in the 70s. Highest sugar was 300 (bubble gum) >> 192 >> 216 >> 167.  Glucometer: True metrix (we cannot download this)  Pt's meals are: - Breakfast:coffee, mc muffin,  egg - Lunch: [place] sandwich - Dinner: baked chicken, veggies,dessert - apples, icecream - Snacks: yoghurt, pretzels, PB  -No CKD, last BUN/creatinine:  Lab Results  Component Value Date   BUN 26 (H) 02/26/2021   BUN 18 08/29/2020   CREATININE 1.07 (H) 02/26/2021   CREATININE 0.99 08/29/2020  On lisinopril 10. -+ HL; last set of lipids: Lab Results  Component Value Date   CHOL 195 04/15/2020   HDL 50.40 04/15/2020   LDLCALC 66 03/29/2019   LDLDIRECT 113.0 04/15/2020   TRIG 243.0 (H) 04/15/2020   CHOLHDL 4 04/15/2020  On Lipitor, fish oil. - last eye exam was 05/15/2020: No DR - no numbness and tingling in her feet.  On ASA 81.  Pt has FH of DM in brother, sister, father.  + h/o BrCA in 2013, recurrence in 2018.  She is cancer free.    ROS: Constitutional: no weight gain/no weight loss, no fatigue, no subjective hyperthermia, no subjective hypothermia Eyes: no blurry vision, no xerophthalmia ENT: no sore throat, no nodules palpated in neck, no dysphagia, no odynophagia, no hoarseness Cardiovascular: no CP/no SOB/no palpitations/no leg swelling Respiratory: no cough/no SOB/no wheezing Gastrointestinal: no N/no V/no D/no C/no acid reflux Musculoskeletal: no muscle aches/no joint aches Skin: no rashes, no hair loss Neurological: no tremors/no numbness/no tingling/no dizziness  I reviewed pt's medications, allergies, PMH, social hx, family hx, and changes were documented in the history of present illness. Otherwise,  unchanged from my initial visit note.  Current Outpatient Medications on File Prior to Visit  Medication Sig  . aspirin 81 MG tablet Take 81 mg by mouth daily as needed (leg pain).   . ASPIRIN LOW DOSE PO   . atorvastatin (LIPITOR) 10 MG tablet Take 0.5 tablets (5 mg total) by mouth daily.  . BD PEN NEEDLE NANO 2ND GEN 32G X 4 MM MISC USE 1 PEN NEEDLE 2 (TWO) TIMES DAILY.  . benzonatate (TESSALON) 100 MG capsule Take 1 capsule by mouth every 8 (eight) hours  for cough.  . Blood Glucose Monitoring Suppl (ONETOUCH VERIO) w/Device KIT Use to check blood sugar 3 times a day. E11.9  . dapagliflozin propanediol (FARXIGA) 10 MG TABS tablet Take 10 mg by mouth daily.  . Fish Oil-Cholecalciferol (FISH OIL + D3 PO)   . gabapentin (NEURONTIN) 300 MG capsule TAKE 1 TO 2 CAPSULES BY MOUTH AT BEDTIME  . glucose blood (ONETOUCH VERIO) test strip Use to check blood sugar 3 times a day. E11.9  . GLYBURIDE-METFORMIN PO   . ibuprofen (ADVIL,MOTRIN) 200 MG tablet Take 400 mg by mouth every 8 (eight) hours as needed for headache or moderate pain.   . Insulin Glargine (BASAGLAR KWIKPEN) 100 UNIT/ML INJECT 20 UNITS SUBCUTANEOUSLY AT BEDTIME  . letrozole (FEMARA) 2.5 MG tablet Take 1 tablet (2.5 mg total) by mouth daily.  Marland Kitchen lisinopril-hydrochlorothiazide (ZESTORETIC) 20-25 MG tablet Take 1 tablet by mouth once daily  . metFORMIN (GLUCOPHAGE) 1000 MG tablet Take 1 tablet (1,000 mg total) by mouth 2 (two) times daily with a meal.  . NON FORMULARY Peripheral Neuropathy cream from [place]  . Omega-3 Fatty Acids (FISH OIL) 1200 MG CAPS Take 1,200 mg by mouth daily.   . TRUEPLUS LANCETS 33G MISC 1 each by Does not apply route 3 (three) times daily.   No current facility-administered medications on file prior to visit.   Past Medical History:  Diagnosis Date  . Blood transfusion without reported diagnosis   . Breast cancer [facility]) August 2002   Invasive ductal carcinoma Left breast. 2002, 2017  . Diabetes mellitus without complication ([place])   . Family history of malignant neoplasm of ovary   . Family history of pancreatic cancer   . Hypertension    Past Surgical History:  Procedure Laterality Date  . BREAST SURGERY Left 2003  . COLONOSCOPY    . FOOT SURGERY  2000  . INSERTION OF MESH N/A 02/17/2018   Procedure: INSERTION OF MESH;  Surgeon: [name], MD;  Location: [place];  Service: General;  Laterality: N/A;  . MASTECTOMY Left 05/28/2016  . port a  cath insertion  05/28/2016  . PORT-A-CATH REMOVAL Right 08/19/2017   Procedure: REMOVAL PORT-A-CATH;  Surgeon: [name], MD;  Location: [place];  Service: General;  Laterality: Right;  . PORTACATH PLACEMENT Right 05/28/2016   Procedure: INSERTION PORT-A-CATH;  Surgeon: [name], MD;  Location: [place];  Service: General;  Laterality: Right;  . TOTAL MASTECTOMY Left 05/28/2016   Procedure: LEFT  MASTECTOMY;  Surgeon: [name], MD;  Location: [place];  Service: General;  Laterality: Left;  . TUBAL LIGATION  22 yrs since  2004.   Bilateral.  . VENTRAL HERNIA REPAIR  02/17/2018   open  . VENTRAL HERNIA REPAIR N/A 02/17/2018   Procedure: OPEN VENTRAL HERNIA REPAIR WITH MESH;  Surgeon: [name], MD;  Location: [place];  Service: General;  Laterality: N/A;   Social History   Socioeconomic History  .  Marital status: Divorced    Spouse name: Not on file  . Number of children: Not on file  . Years of education: Not on file  . Highest education level: Not on file  Occupational History  . Not on file  Tobacco Use  . Smoking status: Former Smoker    Packs/day: 0.10    Years: 7.00    Pack years: 0.70    Quit date: 12/01/1975    Years since quitting: 45.2  . Smokeless tobacco: Never Used  Vaping Use  . Vaping Use: Never used  Substance and Sexual Activity  . Alcohol use: No    Alcohol/week: 0.0 standard drinks  . Drug use: No  . Sexual activity: Yes  Other Topics Concern  . Not on file  Social History Narrative  . Not on file   Social Determinants of Health   Financial Resource Strain: Not on file  Food Insecurity: Not on file  Transportation Needs: Not on file  Physical Activity: Not on file  Stress: Not on file  Social Connections: Not on file  Intimate Partner Violence: Not on file    Allergies  Allergen Reactions  . Codeine Other (See Comments)    "get crazy", "see things that aren't there"   Family History  Problem Relation Age of Onset   . Prostate cancer Father   . Ovarian cancer Maternal Aunt 38       deceased  . Pancreatic cancer Mother 49       deceased  . Cancer Maternal Aunt        2 other mat aunts; unk. primary in 27s; deceased  . Cancer Maternal Uncle        "bone ca"; unk. primary; deceased 63s  . Prostate cancer Maternal Uncle        deceased 28  . Colon cancer Neg Hx   . Esophageal cancer Neg Hx   . Rectal cancer Neg Hx   . Stomach cancer Neg Hx     PE:  BP 140/82 (BP Location: Left Arm, Patient Position: Sitting, Cuff Size: Normal)   Pulse (!) 107   Ht 5' 8"  (1.727 m)   Wt 231 lb 12.8 oz (105.1 kg)   SpO2 98%   BMI 35.25 kg/m  Wt Readings from Last 3 Encounters:  03/04/21 231 lb 12.8 oz (105.1 kg)  02/26/21 230 lb 6.4 oz (104.5 kg)  10/28/20 226 lb 9.6 oz (102.8 kg)   Constitutional: overweight, in NAD Eyes: PERRLA, EOMI, no exophthalmos ENT: moist mucous membranes, no thyromegaly, no cervical lymphadenopathy Cardiovascular: tachycardia, RR, No MRG Respiratory: CTA B Gastrointestinal: abdomen soft, NT, ND, BS+ Musculoskeletal: no deformities, strength intact in all 4 Skin: moist, warm, no rashes Neurological: no tremor with outstretched hands, DTR normal in all 4  ASSESSMENT: 1. DM2, insulin-dependent, uncontrolled, without long-term complications, but with hyperglycemia  2. HL  3.  Obesity class II  PLAN:  1. Patient with longstanding, uncontrolled, type 2 diabetes, on oral antidiabetic regimen with Metformin, SGLT2 inhibitor and also long-acting insulin, with improved control at last visit and an HbA1c of 7.9%, decreased from 8.4%.  In the past, she was able to come off insulin after her sugars improved but we had to restart this in 2020.  At last visit, sugars are fluctuating during the day, with approximately 66% of her blood sugars being slightly above goal.  I advised her to check with the new insurance ([employer]) if they cover a GLP-1 receptor agonist.  At last  visit I  advised her to increase Basaglar slightly until we have the answer from the insurance. - we checked her HbA1c: 9.1% (higher) -At today's visit, sugars appear to be slightly higher than target, but they do not align with her HbA1c!  I am not sure if sugars improved lately since she only brings a log for the last ~2 weeks.  Upon questioning, she ran out of [place] but did not notice it-advised her to restart.  She also has been using lower doses of Basaglar (20 as opposed to 34-36 units as advised at last visit).  We will increase the Basaglar to 30 units.  I also advised her to check with her insurance whether GLP-1 receptor is covered.  In the meantime, I will also send a prescription for the freestyle libre CGM to her pharmacy, since this appears to be covered for her. - I suggested to: Patient Instructions  Please continue: - Metformin 1000 mg 2x a day with meals  Restart: - Farxiga 10 mg daily before b'fast  Increase: - Basaglar 30 units at bedtime  Please let me know when you start the new insurance and whe we can try to add: - Ozempic - Trulicity  Please come back for a follow-up appointment in 3 months.  - advised to check sugars at different times of the day - 4x a day, rotating check times - advised for yearly eye exams >> she is UTD - return to clinic in 3 months  2. HL -Reviewed latest lipid panel from 03/2020: LDL increased, above target, triglycerides also high: Lab Results  Component Value Date   CHOL 195 04/15/2020   HDL 50.40 04/15/2020   LDLCALC 66 03/29/2019   LDLDIRECT 113.0 04/15/2020   TRIG 243.0 (H) 04/15/2020   CHOLHDL 4 04/15/2020  -She was off Lipitor before the above returns and I refilled this.  She continues on fish oil.  No side effects.  3.  Obesity class II -We will restart her SGLT2 inhibitor which should also help with weight loss -She gained 2 pounds before last visit.  Since then, she gained 5 pounds.   Philemon Kingdom, MD PhD [facility]  Endocrinology

## 2021-03-04 NOTE — Patient Instructions (Addendum)
Please continue: - Metformin 1000 mg 2x a day with meals  Restart: - Farxiga 10 mg daily before b'fast  Increase: - Basaglar 30 units at bedtime  Please let me know when you start the new insurance and whe we can try to add: - Ozempic - Trulicity  Please come back for a follow-up appointment in 3 months.

## 2021-04-09 ENCOUNTER — Other Ambulatory Visit: Payer: Self-pay | Admitting: Internal Medicine

## 2021-04-10 NOTE — Telephone Encounter (Signed)
Last filled 11/08/2020... last OV 03/2020

## 2021-05-13 ENCOUNTER — Other Ambulatory Visit: Payer: Self-pay

## 2021-05-13 DIAGNOSIS — C50112 Malignant neoplasm of central portion of left female breast: Secondary | ICD-10-CM

## 2021-05-19 ENCOUNTER — Telehealth: Payer: Self-pay | Admitting: Hematology

## 2021-05-19 NOTE — Telephone Encounter (Signed)
R/s appt per 6/20 sch msg. Pt aware.  

## 2021-05-21 ENCOUNTER — Inpatient Hospital Stay: Payer: HMO

## 2021-05-21 ENCOUNTER — Other Ambulatory Visit: Payer: Self-pay

## 2021-05-21 ENCOUNTER — Other Ambulatory Visit: Payer: Self-pay | Admitting: Hematology

## 2021-05-21 ENCOUNTER — Inpatient Hospital Stay: Payer: HMO | Attending: Hematology

## 2021-05-21 DIAGNOSIS — Z923 Personal history of irradiation: Secondary | ICD-10-CM | POA: Diagnosis not present

## 2021-05-21 DIAGNOSIS — G629 Polyneuropathy, unspecified: Secondary | ICD-10-CM | POA: Diagnosis not present

## 2021-05-21 DIAGNOSIS — Z9012 Acquired absence of left breast and nipple: Secondary | ICD-10-CM | POA: Diagnosis not present

## 2021-05-21 DIAGNOSIS — Z170 Estrogen receptor positive status [ER+]: Secondary | ICD-10-CM | POA: Diagnosis not present

## 2021-05-21 DIAGNOSIS — Z79899 Other long term (current) drug therapy: Secondary | ICD-10-CM | POA: Insufficient documentation

## 2021-05-21 DIAGNOSIS — Z79811 Long term (current) use of aromatase inhibitors: Secondary | ICD-10-CM | POA: Diagnosis not present

## 2021-05-21 DIAGNOSIS — C50112 Malignant neoplasm of central portion of left female breast: Secondary | ICD-10-CM

## 2021-05-21 DIAGNOSIS — D649 Anemia, unspecified: Secondary | ICD-10-CM | POA: Diagnosis not present

## 2021-05-21 DIAGNOSIS — Z9221 Personal history of antineoplastic chemotherapy: Secondary | ICD-10-CM | POA: Insufficient documentation

## 2021-05-21 LAB — COMPREHENSIVE METABOLIC PANEL
ALT: 33 U/L (ref 0–44)
AST: 31 U/L (ref 15–41)
Albumin: 3.7 g/dL (ref 3.5–5.0)
Alkaline Phosphatase: 57 U/L (ref 38–126)
Anion gap: 11 (ref 5–15)
BUN: 24 mg/dL — ABNORMAL HIGH (ref 8–23)
CO2: 23 mmol/L (ref 22–32)
Calcium: 9.6 mg/dL (ref 8.9–10.3)
Chloride: 106 mmol/L (ref 98–111)
Creatinine, Ser: 1.18 mg/dL — ABNORMAL HIGH (ref 0.44–1.00)
GFR, Estimated: 50 mL/min — ABNORMAL LOW (ref >60)
Glucose, Bld: 107 mg/dL — ABNORMAL HIGH (ref 70–99)
Potassium: 4.5 mmol/L (ref 3.5–5.1)
Sodium: 140 mmol/L (ref 135–145)
Total Bilirubin: 0.4 mg/dL (ref 0.3–1.2)
Total Protein: 7.7 g/dL (ref 6.5–8.1)

## 2021-05-21 LAB — CBC WITH DIFFERENTIAL (CANCER CENTER ONLY)
Abs Immature Granulocytes: 0.03 10*3/uL (ref 0.00–0.07)
Basophils Absolute: 0 10*3/uL (ref 0.0–0.1)
Basophils Relative: 1 %
Eosinophils Absolute: 0.2 10*3/uL (ref 0.0–0.5)
Eosinophils Relative: 2 %
HCT: 33.7 % — ABNORMAL LOW (ref 36.0–46.0)
Hemoglobin: 11.3 g/dL — ABNORMAL LOW (ref 12.0–15.0)
Immature Granulocytes: 0 %
Lymphocytes Relative: 32 %
Lymphs Abs: 2.6 10*3/uL (ref 0.7–4.0)
MCH: 33.2 pg (ref 26.0–34.0)
MCHC: 33.5 g/dL (ref 30.0–36.0)
MCV: 99.1 fL (ref 80.0–100.0)
Monocytes Absolute: 0.6 10*3/uL (ref 0.1–1.0)
Monocytes Relative: 8 %
Neutro Abs: 4.8 10*3/uL (ref 1.7–7.7)
Neutrophils Relative %: 57 %
Platelet Count: 299 10*3/uL (ref 150–400)
RBC: 3.4 MIL/uL — ABNORMAL LOW (ref 3.87–5.11)
RDW: 12.5 % (ref 11.5–15.5)
WBC Count: 8.3 10*3/uL (ref 4.0–10.5)
nRBC: 0 % (ref 0.0–0.2)

## 2021-05-21 MED ORDER — LETROZOLE 2.5 MG PO TABS: 2.5000 mg | ORAL_TABLET | Freq: Every day | ORAL | 3 refills | Status: DC

## 2021-05-28 LAB — HM DIABETES EYE EXAM

## 2021-05-29 ENCOUNTER — Encounter: Payer: Self-pay | Admitting: Internal Medicine

## 2021-05-30 ENCOUNTER — Other Ambulatory Visit: Payer: Self-pay | Admitting: Internal Medicine

## 2021-05-30 ENCOUNTER — Ambulatory Visit: Payer: HMO | Admitting: Podiatry

## 2021-05-30 DIAGNOSIS — Z1231 Encounter for screening mammogram for malignant neoplasm of breast: Secondary | ICD-10-CM

## 2021-06-03 ENCOUNTER — Telehealth: Payer: Self-pay

## 2021-06-03 ENCOUNTER — Other Ambulatory Visit: Payer: Self-pay | Admitting: Internal Medicine

## 2021-06-03 NOTE — Telephone Encounter (Signed)
-----   Message from [name], MD sent at 05/31/2021 11:43 AM EDT ----- Please let pt know her lab results. Mild anemia is stable. Cr slightly worse, she has CKD stage III, probably related to her DM,let her f/u with PCP, thanks   [name]  05/31/2021

## 2021-06-03 NOTE — Telephone Encounter (Signed)
I called the patient to inform her of her lab results, she verbalized understanding. The patient states that she was unaware that she had CKD stage III. I explained in detail what kidney disease was and that she should follow up with her PCP Webb Silversmith, NP. I faxed the patient's most recent OV notes and labs with Dr. Burr Medico over to Webb Silversmith, NP at [phone] with receipt of confirmation.

## 2021-06-05 ENCOUNTER — Other Ambulatory Visit: Payer: Self-pay

## 2021-06-05 ENCOUNTER — Encounter: Payer: Self-pay | Admitting: Family Medicine

## 2021-06-05 ENCOUNTER — Ambulatory Visit (INDEPENDENT_AMBULATORY_CARE_PROVIDER_SITE_OTHER): Payer: HMO | Admitting: Family Medicine

## 2021-06-05 VITALS — BP 138/68 | HR 92 | Temp 96.9°F | Ht 68.0 in | Wt 227.2 lb

## 2021-06-05 DIAGNOSIS — E1165 Type 2 diabetes mellitus with hyperglycemia: Secondary | ICD-10-CM

## 2021-06-05 DIAGNOSIS — I10 Essential (primary) hypertension: Secondary | ICD-10-CM

## 2021-06-05 DIAGNOSIS — N1831 Chronic kidney disease, stage 3a: Secondary | ICD-10-CM

## 2021-06-05 DIAGNOSIS — E669 Obesity, unspecified: Secondary | ICD-10-CM | POA: Diagnosis not present

## 2021-06-05 DIAGNOSIS — N183 Chronic kidney disease, stage 3 unspecified: Secondary | ICD-10-CM | POA: Insufficient documentation

## 2021-06-05 DIAGNOSIS — Z794 Long term (current) use of insulin: Secondary | ICD-10-CM

## 2021-06-05 NOTE — Assessment & Plan Note (Signed)
Discussed how this problem influences overall health and the risks it imposes  Reviewed plan for weight loss with lower calorie diet (via better food choices and also portion control or program like weight watchers) and exercise building up to or more than 30 minutes 5 days per week including some aerobic activity    

## 2021-06-05 NOTE — Patient Instructions (Addendum)
Stay hydrated  Aim to get 64 oz -mostly water but other things are ok also   Stop the ibuprofen and aleve or any other anti inflammatory   Tylenol is ok as directed   Lets get a urine sample   Getting blood sugar controlled is very important to kidney health   Let's re check labs in about 1-2 weeks

## 2021-06-05 NOTE — Progress Notes (Signed)
Subjective:    Patient ID: Lindsey Marquez, female    DOB: [birth year], 70 y.o.   MRN: [id number]  This visit occurred during the SARS-CoV-2 public health emergency.  Safety protocols were in place, including screening questions prior to the visit, additional usage of staff PPE, and extensive cleaning of exam room while observing appropriate contact time as indicated for disinfecting solutions.   HPI Pt presents for f/u of recent blood work from the oncologist Wt Readings from Last 3 Encounters:  06/05/21 227 lb 4 oz (103.1 kg)  03/04/21 231 lb 12.8 oz (105.1 kg)  02/26/21 230 lb 6.4 oz (104.5 kg)   34.55 kg/m   Had labs done from oncology  Noted inc Cr  CKD stage 3 likely (3a) GFR of 50 Was 56 last check    Also mild anemia   Lab Results  Component Value Date   CREATININE 1.18 (H) 05/21/2021   BUN 24 (H) 05/21/2021   NA 140 05/21/2021   K 4.5 05/21/2021   CL 106 05/21/2021   CO2 23 05/21/2021   Lab Results  Component Value Date   WBC 8.3 05/21/2021   HGB 11.3 (L) 05/21/2021   HCT 33.7 (L) 05/21/2021   MCV 99.1 05/21/2021   PLT 299 05/21/2021   Ca level nl 9.6  Last iron and ferritin in march were normal   She takes ibuprofen /occ aleve 600 mg twice daily  For leg pain   Drinks water  Occ coke 0   No urinary symptoms   H/o breast cancer with drug induced neuropathy along with HTN and DM2   BP Readings from Last 3 Encounters:  06/05/21 138/68  03/04/21 140/82  02/26/21 (!) 172/77   Lisinopril hct  DM2 Lab Results  Component Value Date   HGBA1C 9.1 (A) 03/04/2021  In the past noncompliant with diet  Sees Dr Marland Kitchen  Doing better  Glucose 95 today  Eating better  Plans to start exercising    Patient Active Problem List   Diagnosis Date Noted   CKD (chronic kidney disease) stage 3, GFR 30-59 ml/min ([place]) 06/05/2021   Screening for malignant neoplasm of colon 02/17/2021   History of colonic polyps 11/18/2020    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 2002, 2017   Diabetes mellitus without complication ([place])    Family history of malignant neoplasm of ovary    Family history of pancreatic cancer    Hypertension    Past Surgical History:  Procedure Laterality Date   BREAST SURGERY Left 2003   COLONOSCOPY     FOOT SURGERY  2000   INSERTION OF MESH N/A 02/17/2018   Procedure: INSERTION OF MESH;  Surgeon: [name], MD;  Location: [place];  Service: General;  Laterality: N/A;   MASTECTOMY Left 05/28/2016   port a cath insertion  05/28/2016   PORT-A-CATH REMOVAL Right 08/19/2017   Procedure: REMOVAL PORT-A-CATH;  Surgeon: [name], MD;  Location: [place];  Service: General;  Laterality: Right;   PORTACATH PLACEMENT Right 05/28/2016   Procedure:  INSERTION PORT-A-CATH;  Surgeon: [name], MD;  Location: [place];  Service: General;  Laterality: Right;   TOTAL MASTECTOMY Left 05/28/2016   Procedure: LEFT  MASTECTOMY;  Surgeon: [name], MD;  Location: [place];  Service: General;  Laterality: Left;   TUBAL LIGATION  22 yrs since  2004.   Bilateral.   VENTRAL HERNIA REPAIR  02/17/2018   open   VENTRAL HERNIA REPAIR N/A 02/17/2018   Procedure: OPEN VENTRAL HERNIA REPAIR WITH MESH;  Surgeon: [name], MD;  Location: [place];  Service: General;  Laterality: N/A;   Social History   Tobacco Use   Smoking status: Former    Packs/day: 0.10    Years: 7.00    Pack years: 0.70    Types: Cigarettes    Quit date: 12/01/1975     Years since quitting: 45.5   Smokeless tobacco: Never  Vaping Use   Vaping Use: Never used  Substance Use Topics   Alcohol use: No    Alcohol/week: 0.0 standard drinks   Drug use: No   Family History  Problem Relation Age of Onset   Prostate cancer Father    Ovarian cancer Maternal Aunt 53       deceased   Pancreatic cancer Mother 76       deceased   Cancer Maternal Aunt        2 other mat aunts; unk. primary in 4s; deceased   Cancer Maternal Uncle        "bone ca"; unk. primary; deceased 58s   Prostate cancer Maternal Uncle        deceased 53   Colon cancer Neg Hx    Esophageal cancer Neg Hx    Rectal cancer Neg Hx    Stomach cancer Neg Hx    Allergies  Allergen Reactions   Codeine Other (See Comments)    "get crazy", "see things that aren't there"   Current Outpatient Medications on File Prior to Visit  Medication Sig Dispense Refill   aspirin 81 MG tablet Take 81 mg by mouth daily as needed (leg pain).     ASPIRIN LOW DOSE PO      atorvastatin (LIPITOR) 10 MG tablet Take 0.5 tablets (5 mg total) by mouth daily. 45 tablet 3   BD PEN NEEDLE NANO 2ND GEN 32G X 4 MM MISC USE 1 PEN NEEDLE 2 (TWO) TIMES DAILY. 200 each 1   benzonatate (TESSALON) 100 MG capsule Take 1 capsule by mouth every 8 (eight) hours for cough. 21 capsule 0   Blood Glucose Monitoring Suppl (ONETOUCH VERIO) w/Device KIT Use to check blood sugar 3 times a day. E11.9 1 kit 0   Continuous Blood Gluc Receiver (FREESTYLE LIBRE 2 READER) DEVI 1 each by Does not apply route daily. 1 each 0   Continuous Blood Gluc Sensor (FREESTYLE LIBRE 2 SENSOR) MISC 1 each by Does not apply route every 14 (fourteen) days. 6 each 3   dapagliflozin propanediol (FARXIGA) 10 MG TABS tablet Take 1 tablet (10 mg total) by mouth daily. 90 tablet 3   Fish Oil-Cholecalciferol (FISH OIL + D3 PO)      gabapentin (NEURONTIN) 300 MG capsule TAKE 1 TO 2 CAPSULES BY MOUTH AT BEDTIME 120 capsule 0   Insulin Glargine (BASAGLAR KWIKPEN) 100  UNIT/ML INJECT 30 UNITS SUBCUTANEOUSLY AT BEDTIME 15 mL 3   letrozole (FEMARA) 2.5 MG tablet Take 1 tablet (2.5 mg total) by mouth daily. 90 tablet 3   lisinopril-hydrochlorothiazide (ZESTORETIC) 20-25 MG tablet  Take 1 tablet by mouth once daily 90 tablet 0   metFORMIN (GLUCOPHAGE) 1000 MG tablet TAKE 1 TABLET BY MOUTH TWICE DAILY WITH A MEAL 180 tablet 0   NON FORMULARY Peripheral Neuropathy cream from [facility]     Omega-3 Fatty Acids (FISH OIL) 1200 MG CAPS Take 1,200 mg by mouth daily.      ONETOUCH VERIO test strip USE TO CHECK BLOOD SUGAR 3 TIMES A DAY. E11.9 300 strip 4   TRUEPLUS LANCETS 33G MISC 1 each by Does not apply route 3 (three) times daily. 600 each 2   No current facility-administered medications on file prior to visit.     Review of Systems  Constitutional:  Negative for activity change, appetite change, fatigue, fever and unexpected weight change.  HENT:  Negative for congestion, ear pain, rhinorrhea, sinus pressure and sore throat.   Eyes:  Negative for pain, redness and visual disturbance.  Respiratory:  Negative for cough, shortness of breath and wheezing.   Cardiovascular:  Negative for chest pain and palpitations.  Gastrointestinal:  Negative for abdominal pain, blood in stool, constipation and diarrhea.  Endocrine: Negative for polydipsia and polyuria.  Genitourinary:  Negative for dysuria, frequency and urgency.  Musculoskeletal:  Negative for arthralgias, back pain and myalgias.  Skin:  Negative for pallor and rash.  Allergic/Immunologic: Negative for environmental allergies.  Neurological:  Negative for dizziness, syncope and headaches.  Hematological:  Negative for adenopathy. Does not bruise/bleed easily.  Psychiatric/Behavioral:  Negative for decreased concentration and dysphoric mood. The patient is not nervous/anxious.       Objective:   Physical Exam Constitutional:      General: She is not in acute distress.    Appearance: Normal  appearance. She is well-developed. She is obese. She is not ill-appearing or diaphoretic.  HENT:     Head: Normocephalic and atraumatic.  Eyes:     Conjunctiva/sclera: Conjunctivae normal.     Pupils: Pupils are equal, round, and reactive to light.  Neck:     Thyroid: No thyromegaly.     Vascular: No carotid bruit or JVD.  Cardiovascular:     Rate and Rhythm: Normal rate and regular rhythm.     Heart sounds: Normal heart sounds.    No gallop.  Pulmonary:     Effort: Pulmonary effort is normal. No respiratory distress.     Breath sounds: Normal breath sounds. No wheezing or rales.  Abdominal:     General: Bowel sounds are normal. There is no distension or abdominal bruit.     Palpations: Abdomen is soft. There is no mass.     Tenderness: There is no abdominal tenderness. There is no right CVA tenderness or left CVA tenderness.  Musculoskeletal:     Cervical back: Normal range of motion and neck supple.     Right lower leg: No edema.     Left lower leg: No edema.  Lymphadenopathy:     Cervical: No cervical adenopathy.  Skin:    General: Skin is warm and dry.     Coloration: Skin is not pale.     Findings: No rash.  Neurological:     Mental Status: She is alert.     Coordination: Coordination normal.     Deep Tendon Reflexes: Reflexes are normal and symmetric. Reflexes normal.  Psychiatric:        Mood and Affect: Mood normal.        Cognition and Memory: Cognition and memory normal.  Assessment & Plan:   Problem List Items Addressed This Visit       Cardiovascular and Mediastinum   HTN (hypertension)    bp in fair control at this time  BP Readings from Last 1 Encounters:  06/05/21 138/68  No changes needed Most recent labs reviewed  Disc lifstyle change with low sodium diet and exercise  Plan to continue lisinopril HCT20-25 mg  If no improvement in GFR in the future we may need to hold the hctz         Endocrine   Type 2 diabetes mellitus with  hyperglycemia, with long-term current use of insulin (HCC)    Lab Results  Component Value Date   HGBA1C 9.1 (A) 03/04/2021  Poorly controlled Under care of Dr Cruzita Lederer  Disc imp of DM control for renal function  Is on ace         Genitourinary   CKD (chronic kidney disease) stage 3, GFR 30-59 ml/min (HCC) - Primary    New finding from oncology with GFR of 50 (prior 56, 58) Has DM and HTN  On metoprolol and lisinopril hctz Takes ibuprofen daily   Disc hydration-will inc fluid to 64 oz daily  ua  Stop nsaid (tylenol instead)  Re check bmet in 1-2 wk If no imp consider change metformin and hctz Handout given Nl exam  No urinary symptoms         Other   Class 2 obesity    Discussed how this problem influences overall health and the risks it imposes  Reviewed plan for weight loss with lower calorie diet (via better food choices and also portion control or program like weight watchers) and exercise building up to or more than 30 minutes 5 days per week including some aerobic activity

## 2021-06-05 NOTE — Assessment & Plan Note (Signed)
bp in fair control at this time  BP Readings from Last 1 Encounters:  06/05/21 138/68   No changes needed Most recent labs reviewed  Disc lifstyle change with low sodium diet and exercise  Plan to continue lisinopril HCT20-25 mg  If no improvement in GFR in the future we may need to hold the hctz

## 2021-06-05 NOTE — Assessment & Plan Note (Signed)
Lab Results  Component Value Date   HGBA1C 9.1 (A) 03/04/2021   Poorly controlled Under care of Dr Cruzita Lederer  Disc imp of DM control for renal function  Is on ace

## 2021-06-05 NOTE — Assessment & Plan Note (Signed)
New finding from oncology with GFR of 50 (prior 56, 58) Has DM and HTN  On metoprolol and lisinopril hctz Takes ibuprofen daily   Disc hydration-will inc fluid to 64 oz daily  ua  Stop nsaid (tylenol instead)  Re check bmet in 1-2 wk If no imp consider change metformin and hctz Handout given Nl exam  No urinary symptoms

## 2021-06-06 LAB — POC URINALSYSI DIPSTICK (AUTOMATED)
Bilirubin, UA: NEGATIVE
Blood, UA: NEGATIVE
Glucose, UA: POSITIVE — AB
Ketones, UA: NEGATIVE
Leukocytes, UA: NEGATIVE
Nitrite, UA: NEGATIVE
Protein, UA: NEGATIVE
Spec Grav, UA: 1.02 (ref 1.010–1.025)
Urobilinogen, UA: 0.2 E.U./dL
pH, UA: 5.5 (ref 5.0–8.0)

## 2021-06-06 NOTE — Addendum Note (Signed)
Addended by: Tammi Sou on: 06/06/2021 04:38 PM   Modules accepted: Orders

## 2021-06-12 ENCOUNTER — Other Ambulatory Visit: Payer: Self-pay

## 2021-06-12 ENCOUNTER — Encounter: Payer: Self-pay | Admitting: Internal Medicine

## 2021-06-12 ENCOUNTER — Ambulatory Visit (INDEPENDENT_AMBULATORY_CARE_PROVIDER_SITE_OTHER): Payer: HMO | Admitting: Internal Medicine

## 2021-06-12 VITALS — BP 128/68 | HR 99 | Ht 68.0 in | Wt 226.0 lb

## 2021-06-12 DIAGNOSIS — E1165 Type 2 diabetes mellitus with hyperglycemia: Secondary | ICD-10-CM | POA: Diagnosis not present

## 2021-06-12 DIAGNOSIS — E669 Obesity, unspecified: Secondary | ICD-10-CM

## 2021-06-12 DIAGNOSIS — Z794 Long term (current) use of insulin: Secondary | ICD-10-CM | POA: Diagnosis not present

## 2021-06-12 DIAGNOSIS — E785 Hyperlipidemia, unspecified: Secondary | ICD-10-CM

## 2021-06-12 LAB — POCT GLYCOSYLATED HEMOGLOBIN (HGB A1C): Hemoglobin A1C: 6.9 % — AB (ref 4.0–5.6)

## 2021-06-12 NOTE — Progress Notes (Signed)
Patient ID: Lindsey Marquez, female   DOB: 03-04-1951, 70 y.o.   MRN: [id number]   This visit occurred during the SARS-CoV-2 public health emergency.  Safety protocols were in place, including screening questions prior to the visit, additional usage of staff PPE, and extensive cleaning of exam room while observing appropriate contact time as indicated for disinfecting solutions.   HPI: Lindsey Marquez is a 70 y.o.-year-old female, returning for follow-up for DM2, dx in ~2001, insulin-dependent, uncontrolled, without long-term complications. Last visit 3 months ago.  Interim history: No increased urination, blurry vision, nausea, chest pain.  She has L leg swelling - chronic, after ChTx. Also has disequilibrium. We tried to start her on a CGM but this was not was not approved.  Reviewed HbA1c levels: Lab Results  Component Value Date   HGBA1C 9.1 (A) 03/04/2021   HGBA1C 7.9 (A) 10/28/2020   HGBA1C 8.4 (H) 04/15/2020   Pt is on: - Basaglar 20 >> 30 >> 34-36 >> 20 >> 30 units at bedtime - Metformin 1000 mg 2x a day with meals  - Farxiga 10 mg daily before b'fast >> restarted at last visit GLP1 R agonists were not affordable.  Pt checks her sugars once a day: - am: 107-150, 155, 188 >> 116-144, 167 >> 95-126, 139 - 2h after b'fast: n/c - before lunch: n/c >> 110-178, 183 >> 125-142 >> n/c - 2h after lunch: n/c >> 344 >> n/c >> 80-174 - before dinner: 180-195 >> 200s >> 103-145, 174 >> n/c - 2h after dinner: 112-191, 216 >> 124-156 >> n/c - bedtime: 244, 266 >> 178-200 >> n/c >> 115-137, 182 - nighttime: n/c Lowest sugar was 103 >> 116 >> 80; she has hypoglycemia awareness in the 70s. Highest sugar was 216 >> 167 >> 182.  Glucometer: True metrix (we cannot download this)  Pt's meals are: - Breakfast:coffee, mc muffin, egg - Lunch: [place] sandwich - Dinner: baked chicken, veggies,dessert - apples, icecream - Snacks: yoghurt, pretzels, PB  -No CKD, last BUN/creatinine:  Lab  Results  Component Value Date   BUN 24 (H) 05/21/2021   BUN 26 (H) 02/26/2021   CREATININE 1.18 (H) 05/21/2021   CREATININE 1.07 (H) 02/26/2021  On lisinopril 10.  -+ HL; last set of lipids: Lab Results  Component Value Date   CHOL 195 04/15/2020   HDL 50.40 04/15/2020   LDLCALC 66 03/29/2019   LDLDIRECT 113.0 04/15/2020   TRIG 243.0 (H) 04/15/2020   CHOLHDL 4 04/15/2020  On Lipitor, fish oil.  - last eye exam was 04/2021: No DR reportedly.  - no numbness and tingling in her feet.  On ASA 81.  Pt has FH of DM in brother, sister, father.  + h/o BrCA in 2013, recurrence in 2018.  She is cancer free.    ROS: + See HPI  Current Outpatient Medications on File Prior to Visit  Medication Sig   aspirin 81 MG tablet Take 81 mg by mouth daily as needed (leg pain).   ASPIRIN LOW DOSE PO    atorvastatin (LIPITOR) 10 MG tablet Take 0.5 tablets (5 mg total) by mouth daily.   BD PEN NEEDLE NANO 2ND GEN 32G X 4 MM MISC USE 1 PEN NEEDLE 2 (TWO) TIMES DAILY.   benzonatate (TESSALON) 100 MG capsule Take 1 capsule by mouth every 8 (eight) hours for cough.   Blood Glucose Monitoring Suppl (ONETOUCH VERIO) w/Device KIT Use to check blood sugar 3 times a day. E11.9   Continuous Blood  Gluc Receiver (FREESTYLE LIBRE 2 READER) DEVI 1 each by Does not apply route daily.   Continuous Blood Gluc Sensor (FREESTYLE LIBRE 2 SENSOR) MISC 1 each by Does not apply route every 14 (fourteen) days.   dapagliflozin propanediol (FARXIGA) 10 MG TABS tablet Take 1 tablet (10 mg total) by mouth daily.   Fish Oil-Cholecalciferol (FISH OIL + D3 PO)    gabapentin (NEURONTIN) 300 MG capsule TAKE 1 TO 2 CAPSULES BY MOUTH AT BEDTIME   Insulin Glargine (BASAGLAR KWIKPEN) 100 UNIT/ML INJECT 30 UNITS SUBCUTANEOUSLY AT BEDTIME   letrozole (FEMARA) 2.5 MG tablet Take 1 tablet (2.5 mg total) by mouth daily.   lisinopril-hydrochlorothiazide (ZESTORETIC) 20-25 MG tablet Take 1 tablet by mouth once daily   metFORMIN  (GLUCOPHAGE) 1000 MG tablet TAKE 1 TABLET BY MOUTH TWICE DAILY WITH A MEAL   NON FORMULARY Peripheral Neuropathy cream from [facility]   Omega-3 Fatty Acids (FISH OIL) 1200 MG CAPS Take 1,200 mg by mouth daily.    ONETOUCH VERIO test strip USE TO CHECK BLOOD SUGAR 3 TIMES A DAY. E11.9   TRUEPLUS LANCETS 33G MISC 1 each by Does not apply route 3 (three) times daily.   No current facility-administered medications on file prior to visit.   Past Medical History:  Diagnosis Date   Blood transfusion without reported diagnosis    Breast cancer [facility]) August 2002   Invasive ductal carcinoma Left breast. 2002, 2017   Diabetes mellitus without complication ([place])    Family history of malignant neoplasm of ovary    Family history of pancreatic cancer    Hypertension    Past Surgical History:  Procedure Laterality Date   BREAST SURGERY Left 2003   COLONOSCOPY     FOOT SURGERY  2000   INSERTION OF MESH N/A 02/17/2018   Procedure: INSERTION OF MESH;  Surgeon: [name], MD;  Location: [place];  Service: General;  Laterality: N/A;   MASTECTOMY Left 05/28/2016   port a cath insertion  05/28/2016   PORT-A-CATH REMOVAL Right 08/19/2017   Procedure: REMOVAL PORT-A-CATH;  Surgeon: [name], MD;  Location: [place];  Service: General;  Laterality: Right;   PORTACATH PLACEMENT Right 05/28/2016   Procedure: INSERTION PORT-A-CATH;  Surgeon: [name], MD;  Location: [place];  Service: General;  Laterality: Right;   TOTAL MASTECTOMY Left 05/28/2016   Procedure: LEFT  MASTECTOMY;  Surgeon: [name], MD;  Location: [place];  Service: General;  Laterality: Left;   TUBAL LIGATION  22 yrs since  2004.   Bilateral.   VENTRAL HERNIA REPAIR  02/17/2018   open   VENTRAL HERNIA REPAIR N/A 02/17/2018   Procedure: OPEN VENTRAL HERNIA REPAIR WITH MESH;  Surgeon: [name], MD;  Location: [place];  Service: General;  Laterality: N/A;   Social History   Socioeconomic History    Marital status: Divorced    Spouse name: Not on file   Number of children: Not on file   Years of education: Not on file   Highest education level: Not on file  Occupational History   Not on file  Tobacco Use   Smoking status: Former    Packs/day: 0.10    Years: 7.00    Pack years: 0.70    Types: Cigarettes    Quit date: 12/01/1975    Years since quitting: 45.5   Smokeless tobacco: Never  Vaping Use   Vaping Use: Never used  Substance and Sexual Activity   Alcohol use: No  Alcohol/week: 0.0 standard drinks   Drug use: No   Sexual activity: Yes  Other Topics Concern   Not on file  Social History Narrative   Not on file   Social Determinants of Health   Financial Resource Strain: Not on file  Food Insecurity: Not on file  Transportation Needs: Not on file  Physical Activity: Not on file  Stress: Not on file  Social Connections: Not on file  Intimate Partner Violence: Not on file    Allergies  Allergen Reactions   Codeine Other (See Comments)    "get crazy", "see things that aren't there"   Family History  Problem Relation Age of Onset   Prostate cancer Father    Ovarian cancer Maternal Aunt 2       deceased   Pancreatic cancer Mother 71       deceased   Cancer Maternal Aunt        2 other mat aunts; unk. primary in 57s; deceased   Cancer Maternal Uncle        "bone ca"; unk. primary; deceased 71s   Prostate cancer Maternal Uncle        deceased 82   Colon cancer Neg Hx    Esophageal cancer Neg Hx    Rectal cancer Neg Hx    Stomach cancer Neg Hx     PE:  BP 128/68 (BP Location: Right Arm, Patient Position: Sitting, Cuff Size: Normal)   Pulse 99   Ht 5' 8"  (1.727 m)   Wt 226 lb (102.5 kg)   SpO2 97%   BMI 34.36 kg/m  Wt Readings from Last 3 Encounters:  06/12/21 226 lb (102.5 kg)  06/05/21 227 lb 4 oz (103.1 kg)  03/04/21 231 lb 12.8 oz (105.1 kg)   Constitutional: overweight, in NAD Eyes: PERRLA, EOMI, no exophthalmos ENT: moist mucous  membranes, no thyromegaly, no cervical lymphadenopathy Cardiovascular: tachycardia, RR, No MRG Respiratory: CTA B Gastrointestinal: abdomen soft, NT, ND, BS+ Musculoskeletal: no deformities, strength intact in all 4 Skin: moist, warm, no rashes Neurological: no tremor with outstretched hands, DTR normal in all 4  ASSESSMENT: 1. DM2, insulin-dependent, uncontrolled, without long-term complications, but with hyperglycemia  2. HL  3.  Obesity class II  PLAN:  1. Patient with longstanding, uncontrolled, type 2 diabetes, on oral antidiabetic regimen with metformin and SGLT2 inhibitor and also long-acting insulin.  In the past, she was able to come off insulin after her sugars improved, but we had to start this back in 2020.  At last visit, HbA1c was higher, at 9.1%.  Sugars were slightly higher than target but they were not aligning with her HbA1c.  Upon questioning, she ran out of [place] and I advised her to restart.  She was also using a lower dose of Basaglar and I advised her to increase this.  I also advised her to check with insurance whether GLP-1 receptor agonists are covered.  I recommended a CGM and sent the prescription for the freestyle libre sensor to the pharmacy.  Unfortunately, this was not covered for her. -At today's visit, sugars are much improved.  She only has occasional mild hyperglycemic spikes, for example on her birthday, but otherwise, the sugars are at goal.  No need to change her regimen for now. - I suggested to: Patient Instructions  Please continue: - Metformin 1000 mg 2x a day with meals - Farxiga 10 mg daily before b'fast - Basaglar 30 units at bedtime  Keep up the good job!  Please come back for a follow-up appointment in 3-4 weeks but has an appointment with PCP coming up months.  - we checked her HbA1c: 6.9% (lower) - advised to check sugars at different times of the day - 1x a day, rotating check times - advised for yearly eye exams >> she is UTD -  return to clinic in 3-4 months  2. HL -Reviewed latest lipid panel from 03/2020: LDL and triglycerides above goal Lab Results  Component Value Date   CHOL 195 04/15/2020   HDL 50.40 04/15/2020   LDLCALC 66 03/29/2019   LDLDIRECT 113.0 04/15/2020   TRIG 243.0 (H) 04/15/2020   CHOLHDL 4 04/15/2020  -She was off Lipitor before the above results returned and I have refilled this.  She takes it now daily.  She continues on fish oil.  No side effects. -She is due for another lipid panel and has an appointment coming up with PCP   3.  Obesity class II -continues [place] which should also help with weight loss -She gained 5 pounds before last visit and she lost 5 pounds since then  Philemon Kingdom, MD PhD [facility]

## 2021-06-12 NOTE — Patient Instructions (Addendum)
Please continue: - Metformin 1000 mg 2x a day with meals - Farxiga 10 mg daily before b'fast - Basaglar 30 units at bedtime  Keep up the good job!  Please come back for a follow-up appointment in 3-4 months.

## 2021-06-19 ENCOUNTER — Other Ambulatory Visit: Payer: Self-pay

## 2021-06-19 ENCOUNTER — Other Ambulatory Visit (INDEPENDENT_AMBULATORY_CARE_PROVIDER_SITE_OTHER): Payer: HMO

## 2021-06-19 DIAGNOSIS — N1831 Chronic kidney disease, stage 3a: Secondary | ICD-10-CM

## 2021-06-19 DIAGNOSIS — I10 Essential (primary) hypertension: Secondary | ICD-10-CM | POA: Diagnosis not present

## 2021-06-19 LAB — BASIC METABOLIC PANEL
BUN: 26 mg/dL — ABNORMAL HIGH (ref 6–23)
CO2: 24 mEq/L (ref 19–32)
Calcium: 9.4 mg/dL (ref 8.4–10.5)
Chloride: 104 mEq/L (ref 96–112)
Creatinine, Ser: 1.03 mg/dL (ref 0.40–1.20)
GFR: 55.26 mL/min — ABNORMAL LOW (ref >60.00)
Glucose, Bld: 105 mg/dL — ABNORMAL HIGH (ref 70–99)
Potassium: 4.4 mEq/L (ref 3.5–5.1)
Sodium: 137 mEq/L (ref 135–145)

## 2021-06-20 ENCOUNTER — Other Ambulatory Visit: Payer: Self-pay | Admitting: Family

## 2021-07-03 ENCOUNTER — Other Ambulatory Visit: Payer: Self-pay | Admitting: Internal Medicine

## 2021-07-03 ENCOUNTER — Other Ambulatory Visit: Payer: Self-pay | Admitting: Hematology

## 2021-07-03 DIAGNOSIS — C50112 Malignant neoplasm of central portion of left female breast: Secondary | ICD-10-CM

## 2021-07-03 DIAGNOSIS — E1165 Type 2 diabetes mellitus with hyperglycemia: Secondary | ICD-10-CM

## 2021-07-11 ENCOUNTER — Other Ambulatory Visit: Payer: Self-pay

## 2021-07-11 ENCOUNTER — Ambulatory Visit (INDEPENDENT_AMBULATORY_CARE_PROVIDER_SITE_OTHER): Payer: HMO | Admitting: Podiatry

## 2021-07-11 ENCOUNTER — Encounter: Payer: Self-pay | Admitting: Podiatry

## 2021-07-11 DIAGNOSIS — B351 Tinea unguium: Secondary | ICD-10-CM | POA: Diagnosis not present

## 2021-07-11 DIAGNOSIS — M79674 Pain in right toe(s): Secondary | ICD-10-CM | POA: Diagnosis not present

## 2021-07-11 DIAGNOSIS — M79675 Pain in left toe(s): Secondary | ICD-10-CM

## 2021-07-11 DIAGNOSIS — L84 Corns and callosities: Secondary | ICD-10-CM

## 2021-07-11 DIAGNOSIS — Z794 Long term (current) use of insulin: Secondary | ICD-10-CM

## 2021-07-11 DIAGNOSIS — E1165 Type 2 diabetes mellitus with hyperglycemia: Secondary | ICD-10-CM | POA: Diagnosis not present

## 2021-07-11 DIAGNOSIS — Q828 Other specified congenital malformations of skin: Secondary | ICD-10-CM | POA: Diagnosis not present

## 2021-07-11 DIAGNOSIS — G620 Drug-induced polyneuropathy: Secondary | ICD-10-CM | POA: Diagnosis not present

## 2021-07-15 NOTE — Progress Notes (Signed)
Subjective: Lindsey Marquez is a pleasant 70 y.o. female patient seen today for painful porokeratosis left 5th digit, calluses b/l feet and painful thick toenails that are difficult to trim. Pain interferes with ambulation. Aggravating factors include wearing enclosed shoe gear. Pain is relieved with periodic professional debridement.  She is seen for preventative diabetic foot care today. 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  She is diabetes and states her blood glucose was 127 mg/dl today. Last A1c was 6.4%.   She notes no new pedal concerns on today's visit.  PCP is Tower, Wynelle Fanny, MD. Last visit was: two months ago per patient recall.  She is also followed by Endocrinologist, Dr. Philemon Kingdom and last visit was 06/12/2021.   Allergies  Allergen Reactions   Codeine Other (See Comments)    "get crazy", "see things that aren't there"    Objective: Physical Exam  General: Lindsey Marquez is a pleasant 70 y.o. African American female, in NAD. AAO x 3.   Vascular:  Capillary refill time to digits immediate b/l. Palpable DP pulse(s) b/l lower extremities Palpable PT pulse(s) b/l lower extremities Pedal hair absent. Lower extremity skin temperature gradient within normal limits. No pain with calf compression b/l. No edema noted b/l lower extremities.  Dermatological:  Skin warm and supple b/l lower extremities. No open wounds b/l lower extremities. No interdigital macerations b/l lower extremities. Toenails 1-5 b/l elongated, discolored, dystrophic, thickened, crumbly with subungual debris and tenderness to dorsal palpation. Hyperkeratotic lesion(s) submet head 1 left foot, submet head 1 right foot, and submet head 4 right foot.  No erythema, no edema, no drainage, no fluctuance. Porokeratotic lesion(s) L 5th toe. No erythema, no edema, no drainage, no fluctuance.  Musculoskeletal:  Normal muscle strength 5/5 to all lower extremity muscle groups  bilaterally. No pain crepitus or joint limitation noted with ROM b/l lower extremities. Hammertoe(s) noted to the 2-5 bilaterally.  Neurological:  Pt has subjective symptoms of neuropathy. Protective sensation intact 5/5 intact bilaterally with 10g monofilament b/l. Vibratory sensation intact b/l.  Assessment and Plan:  1. Pain due to onychomycosis of toenails of both feet   2. Porokeratosis   3. Callus   4. Drug-induced polyneuropathy ([place])   5. Type 2 diabetes mellitus with hyperglycemia, with long-term current use of insulin (HCC)      -Examined patient. -Neuropathy symptoms managed with gabapentin. -Continue diabetic foot care principles: inspect feet daily, monitor glucose as recommended by PCP and/or Endocrinologist, and follow prescribed diet per PCP, Endocrinologist and/or dietician. -Patient to continue soft, supportive shoe gear daily. -Toenails 1-5 b/l were debrided in length and girth with sterile nail nippers and dremel without iatrogenic bleeding.  -Callus(es) submet head 1 left foot, submet head 1 right foot, and submet head 4 right foot pared utilizing sterile scalpel blade without complication or incident. Total number debrided =3. -Painful porokeratotic lesion(s) L 5th toe pared and enucleated with sterile scalpel blade without incident. Total number of lesions debrided=1. -Patient to report any pedal injuries to medical professional immediately. -Patient/POA to call should there be question/concern in the interim.  Return in about 3 months (around 10/11/2021).  Marzetta Board, DPM

## 2021-07-17 ENCOUNTER — Encounter: Payer: Self-pay | Admitting: Nurse Practitioner

## 2021-07-17 ENCOUNTER — Other Ambulatory Visit: Payer: Self-pay

## 2021-07-17 ENCOUNTER — Ambulatory Visit (INDEPENDENT_AMBULATORY_CARE_PROVIDER_SITE_OTHER): Payer: HMO | Admitting: Nurse Practitioner

## 2021-07-17 VITALS — BP 130/60 | HR 95 | Temp 98.7°F | Resp 14 | Ht 68.0 in | Wt 218.8 lb

## 2021-07-17 DIAGNOSIS — N3941 Urge incontinence: Secondary | ICD-10-CM | POA: Diagnosis not present

## 2021-07-17 DIAGNOSIS — W19XXXA Unspecified fall, initial encounter: Secondary | ICD-10-CM | POA: Diagnosis not present

## 2021-07-17 DIAGNOSIS — R8290 Unspecified abnormal findings in urine: Secondary | ICD-10-CM

## 2021-07-17 DIAGNOSIS — M25552 Pain in left hip: Secondary | ICD-10-CM | POA: Diagnosis not present

## 2021-07-17 DIAGNOSIS — N390 Urinary tract infection, site not specified: Secondary | ICD-10-CM

## 2021-07-17 DIAGNOSIS — M25551 Pain in right hip: Secondary | ICD-10-CM

## 2021-07-17 DIAGNOSIS — R319 Hematuria, unspecified: Secondary | ICD-10-CM

## 2021-07-17 LAB — POCT URINALYSIS DIPSTICK
Bilirubin, UA: NEGATIVE
Glucose, UA: POSITIVE — AB
Ketones, UA: NEGATIVE
Nitrite, UA: POSITIVE
Protein, UA: POSITIVE — AB
Spec Grav, UA: 1.015 (ref 1.010–1.025)
Urobilinogen, UA: NEGATIVE E.U./dL — AB
pH, UA: 5.5 (ref 5.0–8.0)

## 2021-07-17 MED ORDER — SULFAMETHOXAZOLE-TRIMETHOPRIM 800-160 MG PO TABS: 1.0000 | ORAL_TABLET | Freq: Two times a day (BID) | ORAL | 0 refills | Status: DC

## 2021-07-17 NOTE — Progress Notes (Signed)
Established Patient Office Visit  Subjective:  Patient ID: Lindsey Marquez, female    DOB: 1951-10-11  Age: 70 y.o. MRN: [id number]  CC:  Chief Complaint  Patient presents with   Fall    At home in the garage on 8/15//22, fell out of a vehicle, fell on her left side of the body. Both hips hurt.   Abnormal urine odor    Noticed for the past 2 weeks, urinary incontinence, pelvic pressure.     HPI [name] presents for acute visit  Fall: States that she was in her garage and she was getting into the car and was trying to scoot into the [place]. Felt that her butt was hanging off. She tried to pull herself up but was unable too and fell. Striking  her left side on the concrete floor. Denies bruising, hit head, LOC. If she sits too long it is hard to get up. She has tried tylenol with some relief   Urinary complaint: approx 2 weeks ago patient noticed pressure at the end of her urination. This has been consistent. Noticed an abnormal odor to her urine for approx a week. Denies dysuria. Does have urgency and frequency.  Past Medical History:  Diagnosis Date   Blood transfusion without reported diagnosis    Breast cancer [facility]) August 2002   Invasive ductal carcinoma Left breast. 2002, 2017   Diabetes mellitus without complication ([place])    Family history of malignant neoplasm of ovary    Family history of pancreatic cancer    Hypertension     Past Surgical History:  Procedure Laterality Date   BREAST SURGERY Left 2003   COLONOSCOPY     FOOT SURGERY  2000   INSERTION OF MESH N/A 02/17/2018   Procedure: INSERTION OF MESH;  Surgeon: [name], MD;  Location: [place];  Service: General;  Laterality: N/A;   MASTECTOMY Left 05/28/2016   port a cath insertion  05/28/2016   PORT-A-CATH REMOVAL Right 08/19/2017   Procedure: REMOVAL PORT-A-CATH;  Surgeon: [name], MD;  Location: [place];  Service: General;  Laterality: Right;   PORTACATH PLACEMENT Right  05/28/2016   Procedure: INSERTION PORT-A-CATH;  Surgeon: [name], MD;  Location: [place];  Service: General;  Laterality: Right;   TOTAL MASTECTOMY Left 05/28/2016   Procedure: LEFT  MASTECTOMY;  Surgeon: [name], MD;  Location: [place];  Service: General;  Laterality: Left;   TUBAL LIGATION  22 yrs since  2004.   Bilateral.   VENTRAL HERNIA REPAIR  02/17/2018   open   VENTRAL HERNIA REPAIR N/A 02/17/2018   Procedure: OPEN VENTRAL HERNIA REPAIR WITH MESH;  Surgeon: [name], MD;  Location: [place];  Service: General;  Laterality: N/A;    Family History  Problem Relation Age of Onset   Prostate cancer Father    Ovarian cancer Maternal Aunt 86       deceased   Pancreatic cancer Mother 10       deceased   Cancer Maternal Aunt        2 other mat aunts; unk. primary in 61s; deceased   Cancer Maternal Uncle        "bone ca"; unk. primary; deceased 33s   Prostate cancer Maternal Uncle        deceased 25   Colon cancer Neg Hx    Esophageal cancer Neg Hx    Rectal cancer Neg Hx    Stomach cancer Neg Hx  Social History   Socioeconomic History   Marital status: Divorced    Spouse name: Not on file   Number of children: Not on file   Years of education: Not on file   Highest education level: Not on file  Occupational History   Not on file  Tobacco Use   Smoking status: Former    Packs/day: 0.10    Years: 7.00    Pack years: 0.70    Types: Cigarettes    Quit date: 12/01/1975    Years since quitting: 45.6   Smokeless tobacco: Never  Vaping Use   Vaping Use: Never used  Substance and Sexual Activity   Alcohol use: No    Alcohol/week: 0.0 standard drinks   Drug use: No   Sexual activity: Yes  Other Topics Concern   Not on file  Social History Narrative   Not on file   Social Determinants of Health   Financial Resource Strain: Not on file  Food Insecurity: Not on file  Transportation Needs: Not on file  Physical Activity: Not on file  Stress: Not on file   Social Connections: Not on file  Intimate Partner Violence: Not on file    Outpatient Medications Prior to Visit  Medication Sig Dispense Refill   aspirin 81 MG tablet Take 81 mg by mouth daily as needed (leg pain).     atorvastatin (LIPITOR) 10 MG tablet Take 0.5 tablets (5 mg total) by mouth daily. 45 tablet 3   BD PEN NEEDLE NANO 2ND GEN 32G X 4 MM MISC USE 1 PEN NEEDLE 2 (TWO) TIMES DAILY. 200 each 1   Blood Glucose Monitoring Suppl (ONETOUCH VERIO) w/Device KIT Use to check blood sugar 3 times a day. E11.9 1 kit 0   dapagliflozin propanediol (FARXIGA) 10 MG TABS tablet Take 1 tablet (10 mg total) by mouth daily. 90 tablet 3   Fish Oil-Cholecalciferol (FISH OIL + D3 PO)      gabapentin (NEURONTIN) 300 MG capsule TAKE 1 TO 2 CAPSULES BY MOUTH AT BEDTIME 120 capsule 0   Insulin Glargine (BASAGLAR KWIKPEN) 100 UNIT/ML INJECT 30 UNITS SUBCUTANEOUSLY AT BEDTIME 15 mL 3   letrozole (FEMARA) 2.5 MG tablet Take 1 tablet (2.5 mg total) by mouth daily. 90 tablet 3   lisinopril-hydrochlorothiazide (ZESTORETIC) 20-25 MG tablet Take 1 tablet by mouth once daily 90 tablet 0   metFORMIN (GLUCOPHAGE) 1000 MG tablet TAKE 1 TABLET BY MOUTH TWICE DAILY WITH A MEAL 180 tablet 0   NON FORMULARY Peripheral Neuropathy cream from [place] [employer] (FISH OIL) 1200 MG CAPS Take 1,200 mg by mouth daily.      ONETOUCH VERIO test strip USE TO CHECK BLOOD SUGAR 3 TIMES A DAY. E11.9 300 strip 4   TRUEPLUS LANCETS 33G MISC 1 each by Does not apply route 3 (three) times daily. 600 each 2   ASPIRIN LOW DOSE PO      benzonatate (TESSALON) 100 MG capsule Take 1 capsule by mouth every 8 (eight) hours for cough. 21 capsule 0   No facility-administered medications prior to visit.    Allergies  Allergen Reactions   Codeine Other (See Comments)    "get crazy", "see things that aren't there"    ROS Review of Systems  Constitutional:  Negative for chills and fever.  Gastrointestinal:   Negative for abdominal pain, nausea and vomiting.  Genitourinary:  Positive for frequency and urgency. Negative for decreased urine volume, dysuria and hematuria.  Abnormal urine odor  Musculoskeletal:  Positive for arthralgias. Negative for back pain and gait problem.  Neurological:  Negative for dizziness, weakness, light-headedness and numbness.     Objective:    Physical Exam Vitals and nursing note reviewed.  Cardiovascular:     Rate and Rhythm: Normal rate and regular rhythm.  Pulmonary:     Effort: Pulmonary effort is normal.     Breath sounds: Normal breath sounds.  Abdominal:     General: Bowel sounds are normal.     Tenderness: There is no abdominal tenderness. There is no right CVA tenderness or left CVA tenderness.     Hernia: No hernia is present.  Musculoskeletal:        General: Tenderness and signs of injury present. Normal range of motion.     Right hip: Tenderness and bony tenderness present. No deformity. Normal strength.     Left hip: Tenderness and bony tenderness present. No deformity. Normal strength.     Right lower leg: No edema.     Left lower leg: No edema.       Legs:     Comments: 5/5 bilateral lower extremity strength FROM, FPROM  Skin:    General: Skin is warm.  Neurological:     General: No focal deficit present.     Mental Status: She is alert.  Psychiatric:        Mood and Affect: Mood normal.        Thought Content: Thought content normal.        Judgment: Judgment normal.    BP 130/60   Pulse 95   Temp 98.7 F (37.1 C)   Resp 14   Ht _0  (1.727 m)   Wt 218 lb 12 oz (99.2 kg)   SpO2 98%   BMI 33.26 kg/m  Wt Readings from Last 3 Encounters:  07/17/21 218 lb 12 oz (99.2 kg)  06/12/21 226 lb (102.5 kg)  06/05/21 227 lb 4 oz (103.1 kg)     Health Maintenance Due  Topic Date Due   Zoster Vaccines- Shingrix (2 of 2) 06/19/2020   COVID-19 Vaccine (4 - Booster for Pfizer series) 01/09/2021   PNA vac Low Risk Adult (2 of 2  - PPSV23) 01/26/2021   FOOT EXAM  04/15/2021   INFLUENZA VACCINE  06/30/2021    There are no preventive care reminders to display for this patient.  No results found for: TSH Lab Results  Component Value Date   WBC 8.3 05/21/2021   HGB 11.3 (L) 05/21/2021   HCT 33.7 (L) 05/21/2021   MCV 99.1 05/21/2021   PLT 299 05/21/2021   Lab Results  Component Value Date   NA 137 06/19/2021   K 4.4 06/19/2021   CHLORIDE 107 09/21/2017   CO2 24 06/19/2021   GLUCOSE 105 (H) 06/19/2021   BUN 26 (H) 06/19/2021   CREATININE 1.03 06/19/2021   BILITOT 0.4 05/21/2021   ALKPHOS 57 05/21/2021   AST 31 05/21/2021   ALT 33 05/21/2021   PROT 7.7 05/21/2021   ALBUMIN 3.7 05/21/2021   CALCIUM 9.4 06/19/2021   ANIONGAP 11 05/21/2021   EGFR >60 09/21/2017   GFR 55.26 (L) 06/19/2021   Lab Results  Component Value Date   CHOL 195 04/15/2020   Lab Results  Component Value Date   HDL 50.40 04/15/2020   Lab Results  Component Value Date   LDLCALC 66 03/29/2019   Lab Results  Component Value Date   TRIG 243.0 (H) 04/15/2020  Lab Results  Component Value Date   CHOLHDL 4 04/15/2020   Lab Results  Component Value Date   HGBA1C 6.9 (A) 06/12/2021      Assessment & Plan:   Problem List Items Addressed This Visit       Genitourinary   Urinary tract infection with hematuria    Patient and symptoms starting approximately 2 weeks ago with post void pressure.  Since then she has developed abnormal urine odor, frequency, urgency, and urge incontinence.  We will start patient on Bactrim twice daily for 7 days.  Patient's GFR below 60 that is why Macrobid was not an option.  We will send off for culture.  Pending results.      Relevant Medications   sulfamethoxazole-trimethoprim (BACTRIM DS) 800-160 MG tablet   Other Relevant Orders   Urine Culture     Other   Fall    Sounds like patient had a mechanical fall.  Was trying to get into a family member's vehicle that she was not used to.   She tried to slide in from the side and in doing so her bottom was not fully in the seat.  When she noticed that she tried to pull herself up using a handle but was unsuccessful and slipped out of the driver side seat onto the concrete floor of her garage striking her left side.  Did not hit her back or head.  Denies LOC.  Continue using Tylenol over-the-counter as needed for pain relief.  Pending imaging results.      Bilateral hip pain    Bilateral hip pain post fall.  Patient has been using Tylenol over-the-counter with moderate relief.  Order x-ray of bilateral hips.  Pending results      Relevant Orders   DG HIPS BILAT W OR W/O PELVIS 3-4 VIEWS   Abnormal urine odor   Other Visit Diagnoses     Urge incontinence of urine    -  Primary   Relevant Orders   POCT urinalysis dipstick (Completed)       No orders of the defined types were placed in this encounter.   Follow-up: Return if symptoms worsen or fail to improve.   This visit occurred during the SARS-CoV-2 public health emergency.  Safety protocols were in place, including screening questions prior to the visit, additional usage of staff PPE, and extensive cleaning of exam room while observing appropriate contact time as indicated for disinfecting solutions.   Romilda Garret, NP

## 2021-07-17 NOTE — Patient Instructions (Signed)
Sent antibiotics to pharmacy of choice Will be in touch once I get the xray results. Continue taking tylenol as needed for pain Follow up if symptoms worsen or fail to improve

## 2021-07-17 NOTE — Assessment & Plan Note (Signed)
Patient and symptoms starting approximately 2 weeks ago with post void pressure.  Since then she has developed abnormal urine odor, frequency, urgency, and urge incontinence.  We will start patient on Bactrim twice daily for 7 days.  Patient's GFR below 60 that is why Macrobid was not an option.  We will send off for culture.  Pending results.

## 2021-07-17 NOTE — Assessment & Plan Note (Signed)
Bilateral hip pain post fall.  Patient has been using Tylenol over-the-counter with moderate relief.  Order x-ray of bilateral hips.  Pending results

## 2021-07-17 NOTE — Assessment & Plan Note (Signed)
Sounds like patient had a mechanical fall.  Was trying to get into a family member's vehicle that she was not used to.  She tried to slide in from the side and in doing so her bottom was not fully in the seat.  When she noticed that she tried to pull herself up using a handle but was unsuccessful and slipped out of the driver side seat onto the concrete floor of her garage striking her left side.  Did not hit her back or head.  Denies LOC.  Continue using Tylenol over-the-counter as needed for pain relief.  Pending imaging results.

## 2021-07-18 ENCOUNTER — Ambulatory Visit
Admission: RE | Admit: 2021-07-18 | Discharge: 2021-07-18 | Disposition: A | Discharge status: Home / Self Care | Payer: HMO | Source: Ambulatory Visit | Attending: Nurse Practitioner | Admitting: Nurse Practitioner

## 2021-07-18 DIAGNOSIS — M25551 Pain in right hip: Secondary | ICD-10-CM

## 2021-07-20 LAB — URINE CULTURE
MICRO NUMBER:: 12260884
SPECIMEN QUALITY:: ADEQUATE

## 2021-07-23 ENCOUNTER — Other Ambulatory Visit: Payer: Self-pay

## 2021-07-23 ENCOUNTER — Ambulatory Visit
Admission: RE | Admit: 2021-07-23 | Discharge: 2021-07-23 | Disposition: A | Discharge status: Home / Self Care | Payer: HMO | Source: Ambulatory Visit | Attending: Internal Medicine | Admitting: Internal Medicine

## 2021-07-23 DIAGNOSIS — Z1231 Encounter for screening mammogram for malignant neoplasm of breast: Secondary | ICD-10-CM

## 2021-08-14 ENCOUNTER — Telehealth: Payer: Self-pay | Admitting: Family Medicine

## 2021-08-14 MED ORDER — LISINOPRIL-HYDROCHLOROTHIAZIDE 20-25 MG PO TABS: 1.0000 | ORAL_TABLET | Freq: Every day | ORAL | 1 refills | Status: DC

## 2021-08-14 NOTE — Telephone Encounter (Signed)
Pt called stating she called the pharmacy about her refill, she said the pharmacy told her to contact her provider because they sent in the request but have not heard anything.She has 4 pills left.lisinopril-hydrochlorothiazide (ZESTORETIC) 20-25 MG tablet [place], [place]

## 2021-08-14 NOTE — Telephone Encounter (Signed)
Rx sent 

## 2021-08-28 ENCOUNTER — Encounter: Payer: Self-pay | Admitting: Hematology

## 2021-08-28 ENCOUNTER — Other Ambulatory Visit: Payer: Self-pay

## 2021-08-28 ENCOUNTER — Inpatient Hospital Stay: Payer: HMO | Attending: Hematology | Admitting: Hematology

## 2021-08-28 ENCOUNTER — Inpatient Hospital Stay: Payer: HMO

## 2021-08-28 VITALS — BP 162/72 | HR 93 | Temp 98.3°F | Resp 18 | Ht 68.0 in | Wt 223.3 lb

## 2021-08-28 DIAGNOSIS — I10 Essential (primary) hypertension: Secondary | ICD-10-CM | POA: Insufficient documentation

## 2021-08-28 DIAGNOSIS — D649 Anemia, unspecified: Secondary | ICD-10-CM | POA: Diagnosis not present

## 2021-08-28 DIAGNOSIS — Z79899 Other long term (current) drug therapy: Secondary | ICD-10-CM | POA: Insufficient documentation

## 2021-08-28 DIAGNOSIS — Z170 Estrogen receptor positive status [ER+]: Secondary | ICD-10-CM | POA: Diagnosis not present

## 2021-08-28 DIAGNOSIS — Z79811 Long term (current) use of aromatase inhibitors: Secondary | ICD-10-CM | POA: Diagnosis not present

## 2021-08-28 DIAGNOSIS — C50112 Malignant neoplasm of central portion of left female breast: Secondary | ICD-10-CM | POA: Diagnosis present

## 2021-08-28 DIAGNOSIS — Z794 Long term (current) use of insulin: Secondary | ICD-10-CM | POA: Diagnosis not present

## 2021-08-28 DIAGNOSIS — E119 Type 2 diabetes mellitus without complications: Secondary | ICD-10-CM | POA: Diagnosis not present

## 2021-08-28 LAB — COMPREHENSIVE METABOLIC PANEL
ALT: 23 U/L (ref 0–44)
AST: 25 U/L (ref 15–41)
Albumin: 4.1 g/dL (ref 3.5–5.0)
Alkaline Phosphatase: 61 U/L (ref 38–126)
Anion gap: 12 (ref 5–15)
BUN: 19 mg/dL (ref 8–23)
CO2: 22 mmol/L (ref 22–32)
Calcium: 9.8 mg/dL (ref 8.9–10.3)
Chloride: 106 mmol/L (ref 98–111)
Creatinine, Ser: 1 mg/dL (ref 0.44–1.00)
GFR, Estimated: >60 mL/min (ref >60)
Glucose, Bld: 114 mg/dL — ABNORMAL HIGH (ref 70–99)
Potassium: 4.7 mmol/L (ref 3.5–5.1)
Sodium: 140 mmol/L (ref 135–145)
Total Bilirubin: 0.5 mg/dL (ref 0.3–1.2)
Total Protein: 8.1 g/dL (ref 6.5–8.1)

## 2021-08-28 LAB — CBC WITH DIFFERENTIAL (CANCER CENTER ONLY)
Abs Immature Granulocytes: 0.01 10*3/uL (ref 0.00–0.07)
Basophils Absolute: 0 10*3/uL (ref 0.0–0.1)
Basophils Relative: 1 %
Eosinophils Absolute: 0.2 10*3/uL (ref 0.0–0.5)
Eosinophils Relative: 3 %
HCT: 33.7 % — ABNORMAL LOW (ref 36.0–46.0)
Hemoglobin: 11.3 g/dL — ABNORMAL LOW (ref 12.0–15.0)
Immature Granulocytes: 0 %
Lymphocytes Relative: 31 %
Lymphs Abs: 2.2 10*3/uL (ref 0.7–4.0)
MCH: 33.6 pg (ref 26.0–34.0)
MCHC: 33.5 g/dL (ref 30.0–36.0)
MCV: 100.3 fL — ABNORMAL HIGH (ref 80.0–100.0)
Monocytes Absolute: 0.5 10*3/uL (ref 0.1–1.0)
Monocytes Relative: 7 %
Neutro Abs: 4.2 10*3/uL (ref 1.7–7.7)
Neutrophils Relative %: 58 %
Platelet Count: 339 10*3/uL (ref 150–400)
RBC: 3.36 MIL/uL — ABNORMAL LOW (ref 3.87–5.11)
RDW: 13.3 % (ref 11.5–15.5)
WBC Count: 7.2 10*3/uL (ref 4.0–10.5)
nRBC: 0 % (ref 0.0–0.2)

## 2021-08-28 MED ORDER — GABAPENTIN 300 MG PO CAPS: 600.0000 mg | ORAL_CAPSULE | Freq: Every day | ORAL | 5 refills | Status: DC

## 2021-08-28 NOTE — Progress Notes (Signed)
[place]   Telephone:([phone] [phone]) [phone]   Clinic Follow up Note   Patient Care Team: Tower, Wynelle Fanny, MD as PCP - General (Family Medicine) [name], MD as Consulting Physician (General Surgery) [name], MD as Consulting Physician (Hematology) Delice Bison Charlestine Massed, NP as Nurse Practitioner (Hematology and Oncology)  Date of Service:  08/28/2021  CHIEF COMPLAINT: f/u of recurrent left breast cancer  CURRENT THERAPY:  Letrozole 2.5 mg daily, started on 12/15/2016  ASSESSMENT & PLAN:  Lindsey Marquez is a 70 y.o. female with   1. 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 She was treated with Neo-adjuvant chemo, left breast lumpectomy, adjuvant chemo and Radiation.  -She had another left breast cancer in 03/2016. She was treated with left mastectomy, adjuvant chemo TCHP and Nerlynx.  -She started letrozole in 11/2016. Tolerating well with manageable hot flashes and stable joint pain. Plan to continue up to 10 years. -her most recent right mammogram from 07/23/21 was negative. -She is clinically doing well. Lab reviewed, her CBC within normal limits except Hg 11.3, CMP is pending. Her physical exam was unremarkable. There is no clinical concern for recurrence. -Continue surveillance. Next mammogram in 06/2022.  -Continue Letrozole  -Lab and f/u in 6 months    2. Mild Anemia  -She notes h/o anemia -Hg has been mildly low since 07/2020. -iron panel and B12 on 02/26/21 were WNL. This is likely anemia of chronic disease  -hgb 11.3 today (08/28/21)   3. DM and HTN, Obesity  -Her HTN is moderately controlled and her DM is not well controlled. She continues to monitor at home.  -She was previously counseled on eating healthy and exercising regularly, she is willing to lose some weight after she completes chemotherapy. -BP at 172/77, BG 168 today (02/26/21) -Continue  medication and f/u with PCP for management.    4. 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 She will continue to wear compression socks.  -She is on Gabapentin 357m nightly currently. I refilled today. -Not mentioned today, likely stable or improved.    5. Bone Health -We previously discussed that Letrozole can cause some bone weakening  -Her 03/2017 DEXA showed Femur Neck Right T-score is -0.8. This patient is considered normal. Her DEXA from 05/2019 shows improvement with lowest t-score 0.8 at [facility]. Will repeat in 2023 -she takes vit D supplement.    Plan -Continue letrozole daily -I refilled her gabapentin today -Lab and f/u in 6 months with NP Lacie   No problem-specific Assessment & Plan notes found for this encounter.   SUMMARY OF ONCOLOGIC HISTORY: Oncology History Overview Note  Cancer of central portion of left breast ([facility]   Staging form: Breast, AJCC 7th Edition     Clinical stage from 04/17/2016: Stage IA (T1c, N0, M0) - Signed by YTruitt Merle MD on 05/07/2016     Pathologic stage from 05/28/2016: Stage IIA (T2, N0, cM0) - Signed by YTruitt Merle MD on 06/11/2016 History of left breast cancer   Staging form: Breast, AJCC 7th Edition     Clinical: Stage IIIB (T4d, N1, cM0) - Signed by NHeath Lark MD on 12/14/2013     Pathologic: Stage IIIB (T4d, N1a, cM0) - Signed by NHeath Lark MD on 12/14/2013     History of left breast cancer (Resolved)  09/12/2002 Imaging   CT scan show no evidence of disease apart from the left axilla   09/2002 -  Neo-Adjuvant Chemotherapy   TAC X4 cycles    01/10/2003 Surgery   The patient had left lumpectomy and axillary lymph node dissection which show residual breast cancer and a sentinel lymph node was positive   2004 -  Adjuvant Chemotherapy   Cytoxan with 5-FU x4 cycles (methotrexate added at cycle 4).   2004 -  Radiation Therapy   Adjuvant left  breast radiation   12/2002 Receptors her2   ER, PR and HER-2 were all negative.   Cancer of central portion of left breast ([place])  04/13/2016 Mammogram   Scheduler coarse heterogeneous calcifications spanning an area of 3.7 cm in the lumpectomy site, indeterminate. Ultrasound of the left axilla was negative.   04/17/2016 Initial Diagnosis   Cancer of central portion of left breast ([place])   04/17/2016 Initial Biopsy   Left breast core needle biopsy showed invasive and in situ ductal carcinoma with calcification, grade 2.   04/17/2016 Receptors her2   ER 100% positive, PR 15% positive, strong staining, Ki-67 10%, HER-2 positive by IHC (3)   05/05/2016 Imaging   Bilateral breast MRI showed a 1.3 x 0.8 x 0.7 cm biopsy-proven ductal carcinoma in the region of the previous lumpectomy. Enlarged lobulated right inferior axillary lymph node with diffuse cortical thickening, biopsy is recommended.   05/15/2016 Pathology Results   right axilary node biopsy was negative for malignant cell    05/28/2016 Surgery   Simple left mastectomy   05/28/2016 Pathology Results   Left mastectomy showed invasive ductal carcinoma, grade 3, 3.6 cm, high grade DCIS, (+) LVI, surgical margins were negative. Tumor 3.6 cm, no lymph nodes identified.   06/25/2016 Imaging   Staging CT scan of the chest, abdomen and pelvis with contrast and a bone scan showed no evidence of metastasis. Postoperative fluid collection in the left breast, likely a seroma or hematoma. Right axillary adenopathy, which was biopsied previously.    07/07/2016 - 07/15/2017 Chemotherapy   Adjuvant chemotherapy with docetaxel, carboplatin, Herceptin and pejeta every 3 weeks, for 6 cycles, followed by Herceptin maintenance therapy for total of one year treatment  Ended [facility] 11/04/16     12/15/2016 -  Anti-estrogen oral therapy   Adjuvant letrozole 2.5 mg once daily   04/29/2017 Mammogram    Mammogram 04/29/2017 IMPRESSION: No mammographic evidence of  malignancy. A result letter of this screening mammogram will be mailed directly to the patient.    08/26/2017 - 07/2018 Antibody Plan   She started Nerlynx 252m on 08/26/17, was held on 09/14/17 due to significant diarrhea and restarted when her diarrhea resolved on 09/21/17. Completed in 07/2018   09/22/2017 Procedure   Colonoscopy by Dr. NSilverio Decamp IMPRESSION: - One 3 mm polyp in the transverse colon, removed with a cold biopsy forceps. Resected and retrieved. - Non-bleeding internal hemorrhoids.   09/22/2017 Pathology Results   Diagnosis, colonoscopy  Surgical [P], transverse, polyp - TUBULAR ADENOMA(S). - HIGH GRADE DYSPLASIA IS NOT IDENTIFIED.      INTERVAL HISTORY:  GSALOTE WEIDMANNis here for a follow up of breast cancer. She was last seen by me on 02/26/21. She presents to the clinic alone. She reports her joint pain varies with the weather.   All other systems were reviewed with the patient and are negative.  MEDICAL HISTORY:  Past Medical History:  Diagnosis Date   Blood transfusion without reported diagnosis  Breast cancer [facility]) August 2002   Invasive ductal carcinoma Left breast. 2002, 2017   Diabetes mellitus without complication ([place])    Family history of malignant neoplasm of ovary    Family history of pancreatic cancer    Hypertension     SURGICAL HISTORY: Past Surgical History:  Procedure Laterality Date   BREAST SURGERY Left 2003   COLONOSCOPY     FOOT SURGERY  2000   INSERTION OF MESH N/A 02/17/2018   Procedure: INSERTION OF MESH;  Surgeon: [name], MD;  Location: [place];  Service: General;  Laterality: N/A;   MASTECTOMY Left 05/28/2016   port a cath insertion  05/28/2016   PORT-A-CATH REMOVAL Right 08/19/2017   Procedure: REMOVAL PORT-A-CATH;  Surgeon: [name], MD;  Location: [place];  Service: General;  Laterality: Right;   PORTACATH PLACEMENT Right 05/28/2016   Procedure: INSERTION PORT-A-CATH;  Surgeon: [name], MD;  Location: [place];  Service: General;  Laterality: Right;   TOTAL MASTECTOMY Left 05/28/2016   Procedure: LEFT  MASTECTOMY;  Surgeon: [name], MD;  Location: [place];  Service: General;  Laterality: Left;   TUBAL LIGATION  22 yrs since  2004.   Bilateral.   VENTRAL HERNIA REPAIR  02/17/2018   open   VENTRAL HERNIA REPAIR N/A 02/17/2018   Procedure: OPEN VENTRAL HERNIA REPAIR WITH MESH;  Surgeon: [name], MD;  Location: [place];  Service: General;  Laterality: N/A;    I have reviewed the social history and family history with the patient and they are unchanged from previous note.  ALLERGIES:  is allergic to codeine.  MEDICATIONS:  Current Outpatient Medications  Medication Sig Dispense Refill   aspirin 81 MG tablet Take 81 mg by mouth daily as needed (leg pain).     atorvastatin (LIPITOR) 10 MG tablet Take 0.5 tablets (5 mg total) by mouth daily. 45 tablet 3   BD PEN NEEDLE NANO 2ND GEN 32G X 4 MM MISC USE 1 PEN NEEDLE 2 (TWO) TIMES DAILY. 200 each 1   Blood Glucose Monitoring Suppl (ONETOUCH VERIO) w/Device KIT Use to check blood sugar 3 times a day. E11.9 1 kit 0   dapagliflozin propanediol (FARXIGA) 10 MG TABS tablet Take 1 tablet (10 mg total) by mouth daily. 90 tablet 3   Fish Oil-Cholecalciferol (FISH OIL + D3 PO)      gabapentin (NEURONTIN) 300 MG capsule Take 2 capsules (600 mg total) by mouth at bedtime. 60 capsule 5   Insulin Glargine (BASAGLAR KWIKPEN) 100 UNIT/ML INJECT 30 UNITS SUBCUTANEOUSLY AT BEDTIME 15 mL 3   letrozole (FEMARA) 2.5 MG tablet Take 1 tablet (2.5 mg total) by mouth daily. 90 tablet 3   lisinopril-hydrochlorothiazide (ZESTORETIC) 20-25 MG tablet Take 1 tablet by mouth daily. 90 tablet 1   metFORMIN (GLUCOPHAGE) 1000 MG tablet TAKE 1 TABLET BY MOUTH TWICE DAILY WITH A MEAL 180 tablet 0   NON FORMULARY Peripheral Neuropathy cream from [facility]     Omega-3 Fatty Acids (FISH OIL) 1200 MG CAPS Take 1,200 mg by mouth daily.       ONETOUCH VERIO test strip USE TO CHECK BLOOD SUGAR 3 TIMES A DAY. E11.9 300 strip 4   TRUEPLUS LANCETS 33G MISC 1 each by Does not apply route 3 (three) times daily. 600 each 2   No current facility-administered medications for this visit.    PHYSICAL EXAMINATION: ECOG PERFORMANCE STATUS: 0 - Asymptomatic  Vitals:   08/28/21 1201  BP: (!) 162/72  Pulse: 93  Resp: 18  Temp: 98.3 F (36.8 C)  SpO2: 100%   Wt Readings from Last 3 Encounters:  08/28/21 223 lb 4.8 oz (101.3 kg)  07/17/21 218 lb 12 oz (99.2 kg)  06/12/21 226 lb (102.5 kg)     GENERAL:alert, no distress and comfortable SKIN: skin color, texture, turgor are normal, no rashes or significant lesions EYES: normal, Conjunctiva are pink and non-injected, sclera clear  NECK: supple, thyroid normal size, non-tender, without nodularity LYMPH:  no palpable lymphadenopathy in the cervical, axillary  LUNGS: clear to auscultation and percussion with normal breathing effort HEART: regular rate & rhythm and no murmurs and no lower extremity edema ABDOMEN:abdomen soft, non-tender and normal bowel sounds Musculoskeletal:no cyanosis of digits and no clubbing  NEURO: alert & oriented x 3 with fluent speech, no focal motor/sensory deficits BREAST: No palpable mass, nodules or adenopathy bilaterally. Breast exam benign.   LABORATORY DATA:  I have reviewed the data as listed CBC Latest Ref Rng & Units 08/28/2021 05/21/2021 02/26/2021  WBC 4.0 - 10.5 K/uL 7.2 8.3 8.7  Hemoglobin 12.0 - 15.0 g/dL 11.3(L) 11.3(L) 11.5(L)  Hematocrit 36.0 - 46.0 % 33.7(L) 33.7(L) 34.4  Platelets 150 - 400 K/uL 339 299 347     CMP Latest Ref Rng & Units 08/28/2021 06/19/2021 05/21/2021  Glucose 70 - 99 mg/dL 114(H) 105(H) 107(H)  BUN 8 - 23 mg/dL 19 26(H) 24(H)  Creatinine 0.44 - 1.00 mg/dL 1.00 1.03 1.18(H)  Sodium 135 - 145 mmol/L 140 137 140  Potassium 3.5 - 5.1 mmol/L 4.7 4.4 4.5  Chloride 98 - 111 mmol/L 106 104 106  CO2 22 - 32 mmol/L 22 24 23    Calcium 8.9 - 10.3 mg/dL 9.8 9.4 9.6  Total Protein 6.5 - 8.1 g/dL 8.1 - 7.7  Total Bilirubin 0.3 - 1.2 mg/dL 0.5 - 0.4  Alkaline Phos 38 - 126 U/L 61 - 57  AST 15 - 41 U/L 25 - 31  ALT 0 - 44 U/L 23 - 33      RADIOGRAPHIC STUDIES: I have personally reviewed the radiological images as listed and agreed with the findings in the report. No results found.    No orders of the defined types were placed in this encounter.  All questions were answered. The patient knows to call the clinic with any problems, questions or concerns. No barriers to learning was detected. The total time spent in the appointment was 30 minutes.     [name], MD 08/28/2021   I, Wilburn Mylar, am acting as scribe for [name], MD.   I have reviewed the above documentation for accuracy and completeness, and I agree with the above.

## 2021-09-12 ENCOUNTER — Telehealth: Payer: Self-pay | Admitting: *Deleted

## 2021-09-12 NOTE — Telephone Encounter (Signed)
Faxed Standard Written Order for DME to [facility] [employer] confirmation received

## 2021-09-13 ENCOUNTER — Other Ambulatory Visit: Payer: Self-pay | Admitting: Internal Medicine

## 2021-09-17 ENCOUNTER — Encounter: Payer: Self-pay | Admitting: Internal Medicine

## 2021-09-17 ENCOUNTER — Ambulatory Visit (INDEPENDENT_AMBULATORY_CARE_PROVIDER_SITE_OTHER): Payer: HMO | Admitting: Internal Medicine

## 2021-09-17 ENCOUNTER — Other Ambulatory Visit: Payer: Self-pay

## 2021-09-17 VITALS — BP 130/78 | HR 105 | Ht 68.0 in | Wt 221.8 lb

## 2021-09-17 DIAGNOSIS — E1165 Type 2 diabetes mellitus with hyperglycemia: Secondary | ICD-10-CM

## 2021-09-17 DIAGNOSIS — Z794 Long term (current) use of insulin: Secondary | ICD-10-CM

## 2021-09-17 DIAGNOSIS — E669 Obesity, unspecified: Secondary | ICD-10-CM

## 2021-09-17 DIAGNOSIS — E785 Hyperlipidemia, unspecified: Secondary | ICD-10-CM | POA: Diagnosis not present

## 2021-09-17 LAB — POCT GLYCOSYLATED HEMOGLOBIN (HGB A1C): Hemoglobin A1C: 6.6 % — AB (ref 4.0–5.6)

## 2021-09-17 MED ORDER — DAPAGLIFLOZIN PROPANEDIOL 10 MG PO TABS: 10.0000 mg | ORAL_TABLET | Freq: Every day | ORAL | 3 refills | Status: DC

## 2021-09-17 MED ORDER — BASAGLAR KWIKPEN 100 UNIT/ML ~~LOC~~ SOPN: PEN_INJECTOR | SUBCUTANEOUS | 3 refills | Status: DC

## 2021-09-17 MED ORDER — METFORMIN HCL 1000 MG PO TABS: 1000.0000 mg | ORAL_TABLET | Freq: Two times a day (BID) | ORAL | 3 refills | Status: DC

## 2021-09-17 NOTE — Patient Instructions (Addendum)
Please continue: - Metformin 1000 mg 2x a day with meals - Farxiga 10 mg daily before b'fast - Basaglar 30 units at bedtime  Please come back for a follow-up appointment in 4 months. 

## 2021-09-17 NOTE — Progress Notes (Signed)
Patient ID: Lindsey Marquez, female   DOB: 1951-05-07, 70 y.o.   MRN: [id number]   This visit occurred during the SARS-CoV-2 public health emergency.  Safety protocols were in place, including screening questions prior to the visit, additional usage of staff PPE, and extensive cleaning of exam room while observing appropriate contact time as indicated for disinfecting solutions.   HPI: Lindsey Marquez is a 70 y.o.-year-old female, returning for follow-up for DM2, dx in ~2001, insulin-dependent, uncontrolled, without long-term complications. Last visit 3 months ago.  Interim history: No increased urination, blurry vision, nausea, chest pain. 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  Reviewed HbA1c levels: Lab Results  Component Value Date   HGBA1C 6.9 (A) 06/12/2021   HGBA1C 9.1 (A) 03/04/2021   HGBA1C 7.9 (A) 10/28/2020   Pt is on: - Basaglar 20 >> 30 >> 34-36 >> 20 >> 30 units at bedtime - Metformin 1000 mg 2x a day with meals  - Farxiga 10 mg daily before b'fast >> restarted at last visit GLP1 R agonists were not affordable.  Pt checks her sugars once a day: - am: 107-150, 155, 188 >> 116-144, 167 >> 95-126, 139 >> 93-117, 125, 134 - 2h after b'fast: n/c - before lunch: n/c >> 110-178, 183 >> 125-142 >> n/c >> 93-98, 122 - 2h after lunch: n/c >> 344 >> n/c >> 80-174 >> n/c - before dinner: 180-195 >> 200s >> 103-145, 174 >> n/c - 2h after dinner: 112-191, 216 >> 124-156 >> n/c - bedtime: 244, 266 >> 178-200 >> n/c >> 115-137, 182 >> 92-124, 146 - nighttime: n/c Lowest sugar was 103 >> 116 >> 80 >> 93; she has hypoglycemia awareness in the 70s. Highest sugar was 216 >> 167 >> 182 >> 146.  Glucometer: True metrix (we cannot download this)  Pt's meals are: - Breakfast:coffee, mc muffin, egg - Lunch: [place] sandwich - Dinner: baked chicken, veggies,dessert - apples, - Snacks: yoghurt, pretzels, PB  -No CKD, last BUN/creatinine:  Lab Results  Component Value  Date   BUN 19 08/28/2021   BUN 26 (H) 06/19/2021   CREATININE 1.00 08/28/2021   CREATININE 1.03 06/19/2021  On lisinopril 10.  -+ HL; last set of lipids: Lab Results  Component Value Date   CHOL 195 04/15/2020   HDL 50.40 04/15/2020   LDLCALC 66 03/29/2019   LDLDIRECT 113.0 04/15/2020   TRIG 243.0 (H) 04/15/2020   CHOLHDL 4 04/15/2020  On Lipitor, fish oil.  - last eye exam was 04/2021: No DR reportedly.  - no numbness and tingling in her feet.  On ASA 81.  Pt has FH of DM in brother, sister, father.  + h/o BrCA in 2013, recurrence in 2018.  She is cancer free.    ROS: + See HPI  Current Outpatient Medications on File Prior to Visit  Medication Sig   aspirin 81 MG tablet Take 81 mg by mouth daily as needed (leg pain).   atorvastatin (LIPITOR) 10 MG tablet Take 0.5 tablets (5 mg total) by mouth daily.   BD PEN NEEDLE NANO 2ND GEN 32G X 4 MM MISC USE 1 PEN NEEDLE 2 (TWO) TIMES DAILY.   Blood Glucose Monitoring Suppl (ONETOUCH VERIO) w/Device KIT Use to check blood sugar 3 times a day. E11.9   dapagliflozin propanediol (FARXIGA) 10 MG TABS tablet Take 1 tablet (10 mg total) by mouth daily.   Fish Oil-Cholecalciferol (FISH OIL + D3 PO)    gabapentin (NEURONTIN) 300 MG  capsule Take 2 capsules (600 mg total) by mouth at bedtime.   Insulin Glargine (BASAGLAR KWIKPEN) 100 UNIT/ML INJECT 30 UNITS SUBCUTANEOUSLY AT BEDTIME   letrozole (FEMARA) 2.5 MG tablet Take 1 tablet (2.5 mg total) by mouth daily.   lisinopril-hydrochlorothiazide (ZESTORETIC) 20-25 MG tablet Take 1 tablet by mouth daily.   metFORMIN (GLUCOPHAGE) 1000 MG tablet TAKE 1 TABLET BY MOUTH TWICE DAILY WITH A MEAL   NON FORMULARY Peripheral Neuropathy cream from [facility]   Omega-3 Fatty Acids (FISH OIL) 1200 MG CAPS Take 1,200 mg by mouth daily.    ONETOUCH VERIO test strip USE TO CHECK BLOOD SUGAR 3 TIMES A DAY. E11.9   TRUEPLUS LANCETS 33G MISC 1 each by Does not apply route 3 (three) times daily.   No  current facility-administered medications on file prior to visit.   Past Medical History:  Diagnosis Date   Blood transfusion without reported diagnosis    Breast cancer [facility]) August 2002   Invasive ductal carcinoma Left breast. 2002, 2017   Diabetes mellitus without complication ([place])    Family history of malignant neoplasm of ovary    Family history of pancreatic cancer    Hypertension    Past Surgical History:  Procedure Laterality Date   BREAST SURGERY Left 2003   COLONOSCOPY     FOOT SURGERY  2000   INSERTION OF MESH N/A 02/17/2018   Procedure: INSERTION OF MESH;  Surgeon: [name], MD;  Location: [place];  Service: General;  Laterality: N/A;   MASTECTOMY Left 05/28/2016   port a cath insertion  05/28/2016   PORT-A-CATH REMOVAL Right 08/19/2017   Procedure: REMOVAL PORT-A-CATH;  Surgeon: [name], MD;  Location: [place];  Service: General;  Laterality: Right;   PORTACATH PLACEMENT Right 05/28/2016   Procedure: INSERTION PORT-A-CATH;  Surgeon: [name], MD;  Location: [place];  Service: General;  Laterality: Right;   TOTAL MASTECTOMY Left 05/28/2016   Procedure: LEFT  MASTECTOMY;  Surgeon: [name], MD;  Location: [place];  Service: General;  Laterality: Left;   TUBAL LIGATION  22 yrs since  2004.   Bilateral.   VENTRAL HERNIA REPAIR  02/17/2018   open   VENTRAL HERNIA REPAIR N/A 02/17/2018   Procedure: OPEN VENTRAL HERNIA REPAIR WITH MESH;  Surgeon: [name], MD;  Location: [place];  Service: General;  Laterality: N/A;   Social History   Socioeconomic History   Marital status: Divorced    Spouse name: Not on file   Number of children: Not on file   Years of education: Not on file   Highest education level: Not on file  Occupational History   Not on file  Tobacco Use   Smoking status: Former    Packs/day: 0.10    Years: 7.00    Pack years: 0.70    Types: Cigarettes    Quit date: 12/01/1975    Years since quitting: 45.8   Smokeless  tobacco: Never  Vaping Use   Vaping Use: Never used  Substance and Sexual Activity   Alcohol use: No    Alcohol/week: 0.0 standard drinks   Drug use: No   Sexual activity: Yes  Other Topics Concern   Not on file  Social History Narrative   Not on file   Social Determinants of Health   Financial Resource Strain: Not on file  Food Insecurity: Not on file  Transportation Needs: Not on file  Physical Activity: Not on file  Stress: Not on file  Social Connections: Not on file  Intimate Partner Violence: Not on file    Allergies  Allergen Reactions   Codeine Other (See Comments)    "get crazy", "see things that aren't there"   Family History  Problem Relation Age of Onset   Prostate cancer Father    Ovarian cancer Maternal Aunt 26       deceased   Pancreatic cancer Mother 46       deceased   Cancer Maternal Aunt        2 other mat aunts; unk. primary in 77s; deceased   Cancer Maternal Uncle        "bone ca"; unk. primary; deceased 13s   Prostate cancer Maternal Uncle        deceased 68   Colon cancer Neg Hx    Esophageal cancer Neg Hx    Rectal cancer Neg Hx    Stomach cancer Neg Hx     PE:  BP 130/78 (BP Location: Right Arm, Patient Position: Sitting, Cuff Size: Normal)   Pulse (!) 105   Ht _0  (1.727 m)   Wt 221 lb 12.8 oz (100.6 kg)   SpO2 97%   BMI 33.72 kg/m  Wt Readings from Last 3 Encounters:  09/17/21 221 lb 12.8 oz (100.6 kg)  08/28/21 223 lb 4.8 oz (101.3 kg)  07/17/21 218 lb 12 oz (99.2 kg)   Constitutional: overweight, in NAD Eyes: PERRLA, EOMI, no exophthalmos ENT: moist mucous membranes, no thyromegaly, no cervical lymphadenopathy Cardiovascular: tachycardia, RR, No MRG Respiratory: CTA B Gastrointestinal: abdomen soft, NT, ND, BS+ Musculoskeletal: no deformities, strength intact in all 4 Skin: moist, warm, no rashes Neurological: no tremor with outstretched hands, DTR normal in all 4  ASSESSMENT: 1. DM2, insulin-dependent,  uncontrolled, without long-term complications, but with hyperglycemia  2. HL  3.  Obesity class II  PLAN:  1. Patient with longstanding, uncontrolled, type 2 diabetes, on oral antidiabetic regimen with metformin, SGLT2 inhibitor and also long-acting insulin.  In the past, she was able to come off insulin after her sugars improved, but we had to start back in 2020.  Also, sugars were worse when she ran out of [place] but we started it back.  At previous visit I advised her to check with her insurance whether GLP-1 receptor agonists were covered.  I also recommended a CGM but this was not covered for her.  At last visit, sugars are much improved, with only occasional mild hyperglycemic spikes but otherwise sugars were at goal.  We did not change the regimen.  HbA1c was better, at 6.9%. -At today's visit, sugars are almost all at goal, with very few only slightly hyperglycemic exceptions.  Overall, there is an improvement from last visit as she continues to adjust her diet.  I congratulated her for the blood sugar control.  For now, we will continue the same regimen with the holidays coming up, but at next visit, I would like to try to decrease her Basaglar dose. - I suggested to: Patient Instructions  Please continue: - Metformin 1000 mg 2x a day with meals - Farxiga 10 mg daily before b'fast - Basaglar 30 units at bedtime  Please come back for a follow-up appointment in 4 months.  - we checked her HbA1c: 6.6% (lower!) - advised to check sugars at different times of the day - 1x a day, rotating check times - advised for yearly eye exams >> she is UTD - return to clinic in 4 months  2.  HL -Reviewed latest lipid panel from 03/2020: LDL and triglycerides above goal: Lab Results  Component Value Date   CHOL 195 04/15/2020   HDL 50.40 04/15/2020   LDLCALC 66 03/29/2019   LDLDIRECT 113.0 04/15/2020   TRIG 243.0 (H) 04/15/2020   CHOLHDL 4 04/15/2020  -She was off Lipitor before the above  results returned and I refilled this.  She now takes it daily.  She also continues on fish oil.  No side effects. -She is due for another lipid panel -we will check this today (nonfasting)  3.  Obesity class II -We will continue with Wilder Glade which should also help with weight loss -She lost 5 pounds before last visit and 5 more lbs since then  Philemon Kingdom, MD PhD [facility]

## 2021-09-17 NOTE — Addendum Note (Signed)
Addended by: Lauralyn Primes on: 09/17/2021 11:36 AM   Modules accepted: Orders

## 2021-10-16 ENCOUNTER — Telehealth: Payer: Self-pay | Admitting: *Deleted

## 2021-10-16 NOTE — Chronic Care Management (AMB) (Signed)
  Chronic Care Management   Note  10/16/2021 Name: Lindsey Marquez MRN: [id number] DOB: Jun 23, 1951  [name] is a 70 y.o. year old female who is a primary care patient of Tower, Wynelle Fanny, MD. I reached out to [name] by phone today in response to a referral sent by [name]'s PCP.  [name] was given information about Chronic Care Management services today including:  CCM service includes personalized support from designated clinical staff supervised by her physician, including individualized plan of care and coordination with other care providers 24/7 contact phone numbers for assistance for urgent and routine care needs. Service will only be billed when office clinical staff spend 20 minutes or more in a month to coordinate care. Only one practitioner may furnish and bill the service in a calendar month. The patient may stop CCM services at any time (effective at the end of the month) by phone call to the office staff. The patient is responsible for co-pay (up to 20% after annual deductible is met) if co-pay is required by the individual health plan.   Patient agreed to services and verbal consent obtained.   Follow up plan: Telephone appointment with care management team member scheduled for: 10/28/2021  Julian Hy, [place] Management  Direct Dial: [phone]

## 2021-10-17 ENCOUNTER — Ambulatory Visit (INDEPENDENT_AMBULATORY_CARE_PROVIDER_SITE_OTHER): Payer: HMO | Admitting: Podiatry

## 2021-10-17 ENCOUNTER — Encounter: Payer: Self-pay | Admitting: Podiatry

## 2021-10-17 ENCOUNTER — Other Ambulatory Visit: Payer: Self-pay

## 2021-10-17 DIAGNOSIS — M79675 Pain in left toe(s): Secondary | ICD-10-CM

## 2021-10-17 DIAGNOSIS — M79674 Pain in right toe(s): Secondary | ICD-10-CM

## 2021-10-17 DIAGNOSIS — L84 Corns and callosities: Secondary | ICD-10-CM

## 2021-10-17 DIAGNOSIS — B351 Tinea unguium: Secondary | ICD-10-CM

## 2021-10-17 DIAGNOSIS — G620 Drug-induced polyneuropathy: Secondary | ICD-10-CM | POA: Diagnosis not present

## 2021-10-17 DIAGNOSIS — Q828 Other specified congenital malformations of skin: Secondary | ICD-10-CM

## 2021-10-17 DIAGNOSIS — E1165 Type 2 diabetes mellitus with hyperglycemia: Secondary | ICD-10-CM

## 2021-10-17 DIAGNOSIS — Z794 Long term (current) use of insulin: Secondary | ICD-10-CM

## 2021-10-17 DIAGNOSIS — E119 Type 2 diabetes mellitus without complications: Secondary | ICD-10-CM

## 2021-10-22 NOTE — Progress Notes (Signed)
ANNUAL DIABETIC FOOT EXAM  Subjective: Lindsey Marquez presents today for for annual diabetic foot examination, at risk foot care with history of peripheral neuropathy, callus(es) bilaterally and painful thick toenails that are difficult to trim. Painful toenails interfere with ambulation. Aggravating factors include wearing enclosed shoe gear. Pain is relieved with periodic professional debridement. Painful calluses are aggravated when weightbearing with and without shoegear. Pain is relieved with periodic professional debridement., and painful porokeratotic lesions left foot.  Pain prevent comfortable ambulation. Aggravating factor is weightbearing with or without shoegear.  Patient relates 7 year h/o diabetes.  Patient denies any h/o foot wounds.  Patient denies any symptoms of foot numbness.  Patient relates occasional symptoms of foot tingling when standing for long periods of time.  Patient has been diagnosed with neuropathy and it is managed with gabapentin.  Patient's blood sugar was 128 mg/dl today.  Tower, Wynelle Fanny, MD is patient's PCP. Last visit was 06/05/2021.  Past Medical History:  Diagnosis Date   Blood transfusion without reported diagnosis    Breast cancer [facility]) August 2002   Invasive ductal carcinoma Left breast. 2002, 2017   Diabetes mellitus without complication ([place])    Family history of malignant neoplasm of ovary    Family history of pancreatic cancer    Hypertension    Patient Active Problem List   Diagnosis Date Noted   Fall 07/17/2021   Bilateral hip pain 07/17/2021   Abnormal urine odor 07/17/2021   Urinary tract infection with hematuria 07/17/2021   CKD (chronic kidney disease) stage 3, GFR 30-59 ml/min ([place]) 06/05/2021   Screening for malignant neoplasm of colon 02/17/2021   History of colonic polyps 11/18/2020   Drug-induced polyneuropathy ([place]) 04/15/2020   Osteoarthritis 04/15/2020   Hyperlipidemia 09/20/2018   Class 2 obesity 09/20/2018    Cancer of central portion of left breast ([place]) 05/07/2016   HTN (hypertension) 03/24/2016   Type 2 diabetes mellitus with hyperglycemia, with long-term current use of insulin ([place]) 03/24/2016   Past Surgical History:  Procedure Laterality Date   BREAST SURGERY Left 2003   COLONOSCOPY     FOOT SURGERY  2000   INSERTION OF MESH N/A 02/17/2018   Procedure: INSERTION OF MESH;  Surgeon: [name], MD;  Location: [place];  Service: General;  Laterality: N/A;   MASTECTOMY Left 05/28/2016   port a cath insertion  05/28/2016   PORT-A-CATH REMOVAL Right 08/19/2017   Procedure: REMOVAL PORT-A-CATH;  Surgeon: [name], MD;  Location: [place];  Service: General;  Laterality: Right;   PORTACATH PLACEMENT Right 05/28/2016   Procedure: INSERTION PORT-A-CATH;  Surgeon: [name], MD;  Location: [place];  Service: General;  Laterality: Right;   TOTAL MASTECTOMY Left 05/28/2016   Procedure: LEFT  MASTECTOMY;  Surgeon: [name], MD;  Location: [place];  Service: General;  Laterality: Left;   TUBAL LIGATION  22 yrs since  2004.   Bilateral.   VENTRAL HERNIA REPAIR  02/17/2018   open   VENTRAL HERNIA REPAIR N/A 02/17/2018   Procedure: OPEN VENTRAL HERNIA REPAIR WITH MESH;  Surgeon: [name], MD;  Location: [place];  Service: General;  Laterality: N/A;   Current Outpatient Medications on File Prior to Visit  Medication Sig Dispense Refill   aspirin 81 MG tablet Take 81 mg by mouth daily as needed (leg pain).     atorvastatin (LIPITOR) 10 MG tablet Take 0.5 tablets (5 mg total) by mouth daily. 45 tablet 3   BD PEN NEEDLE NANO  2ND GEN 32G X 4 MM MISC USE 1 PEN NEEDLE 2 (TWO) TIMES DAILY. 200 each 1   Blood Glucose Monitoring Suppl (ONETOUCH VERIO) w/Device KIT Use to check blood sugar 3 times a day. E11.9 1 kit 0   dapagliflozin propanediol (FARXIGA) 10 MG TABS tablet Take 1 tablet (10 mg total) by mouth daily. 90 tablet 3   Fish Oil-Cholecalciferol (FISH OIL + D3 PO)       gabapentin (NEURONTIN) 300 MG capsule Take 2 capsules (600 mg total) by mouth at bedtime. 60 capsule 5   Insulin Glargine (BASAGLAR KWIKPEN) 100 UNIT/ML Inject under skin 30 units daily 30 mL 3   letrozole (FEMARA) 2.5 MG tablet Take 1 tablet (2.5 mg total) by mouth daily. 90 tablet 3   lisinopril-hydrochlorothiazide (ZESTORETIC) 20-25 MG tablet Take 1 tablet by mouth daily. 90 tablet 1   metFORMIN (GLUCOPHAGE) 1000 MG tablet Take 1 tablet (1,000 mg total) by mouth 2 (two) times daily with a meal. 180 tablet 3   NON FORMULARY Peripheral Neuropathy cream from [employer] (FISH OIL) 1200 MG CAPS Take 1,200 mg by mouth daily.      ONETOUCH VERIO test strip USE TO CHECK BLOOD SUGAR 3 TIMES A DAY. E11.9 300 strip 4   TRUEPLUS LANCETS 33G MISC 1 each by Does not apply route 3 (three) times daily. 600 each 2   No current facility-administered medications on file prior to visit.    Allergies  Allergen Reactions   Codeine Other (See Comments)    "get crazy", "see things that aren't there"   Social History   Occupational History   Not on file  Tobacco Use   Smoking status: Former    Packs/day: 0.10    Years: 7.00    Pack years: 0.70    Types: Cigarettes    Quit date: 12/01/1975    Years since quitting: 45.9   Smokeless tobacco: Never  Vaping Use   Vaping Use: Never used  Substance and Sexual Activity   Alcohol use: No    Alcohol/week: 0.0 standard drinks   Drug use: No   Sexual activity: Yes   Family History  Problem Relation Age of Onset   Prostate cancer Father    Ovarian cancer Maternal Aunt 37       deceased   Pancreatic cancer Mother 79       deceased   Cancer Maternal Aunt        2 other mat aunts; unk. primary in 86s; deceased   Cancer Maternal Uncle        "bone ca"; unk. primary; deceased 98s   Prostate cancer Maternal Uncle        deceased 30   Colon cancer Neg Hx    Esophageal cancer Neg Hx    Rectal cancer Neg Hx    Stomach cancer  Neg Hx    Immunization History  Administered Date(s) Administered   Fluad Quad(high Dose 65+) 08/03/2019, 08/29/2020   Influenza, High Dose Seasonal PF 09/14/2017   Influenza,inj,Quad PF,6+ Mos 09/07/2016, 09/29/2018   Influenza-Unspecified 10/31/2015   PFIZER(Purple Top)SARS-COV-2 Vaccination 01/25/2020, 02/20/2020   Pneumococcal Conjugate-13 10/04/2017   Pneumococcal Polysaccharide-23 01/27/2016   Tdap 09/08/2012   Zoster Recombinat (Shingrix) 04/24/2020   Zoster, Live 09/08/2012     Review of Systems: Negative except as noted in the HPI.   Objective: There were no vitals filed for this visit.  Lindsey Marquez is a pleasant 70 y.o. female  in NAD. AAO X 3.  Vascular Examination: CFT immediate b/l LE. Palpable DP/PT pulses b/l LE. Digital hair absent b/l. Skin temperature gradient WNL b/l. No pain with calf compression b/l. No edema noted b/l. No cyanosis or clubbing noted b/l LE.  Dermatological Examination: Pedal skin warm and supple b/l.  No open wounds b/l. No interdigital macerations. Toenails 1-5 b/l elongated, thickened, discolored with subungual debris. +Tenderness with dorsal palpation of nailplates. Hyperkeratotic lesion(s) noted submet head 1 b/l and submet head 4 right foot.  Porokeratotic lesion(s) noted L 5th toe.  Musculoskeletal Examination: Normal muscle strength 5/5 to all lower extremity muscle groups bilaterally. HAV with bunion deformity noted b/l LE. Hammertoe deformity noted 1-5 b/l.Marland Kitchen No pain, crepitus or joint limitation noted with ROM b/l LE.  Patient ambulates independently without assistive aids.  Footwear Assessment: Does the patient wear appropriate shoes? Yes. Does the patient need inserts/orthotics? Yes..  Neurological Examination: Pt has subjective symptoms of neuropathy. Protective sensation intact 5/5 intact bilaterally with 10g monofilament b/l. Vibratory sensation intact b/l.  Hemoglobin A1C Latest Ref Rng & Units 09/17/2021 06/12/2021  03/04/2021 10/28/2020  HGBA1C 4.0 - 5.6 % 6.6(A) 6.9(A) 9.1(A) 7.9(A)  Some recent data might be hidden   Assessment: 1. Pain due to onychomycosis of toenails of both feet   2. Porokeratosis   3. Callus   4. Drug-induced polyneuropathy ([place])   5. Type 2 diabetes mellitus with hyperglycemia, with long-term current use of insulin ([facility])   6. Encounter for diabetic foot exam ([place])     ADA Risk Categorization: Low Risk :  Patient has all of the following: Intact protective sensation No prior foot ulcer  No severe deformity Pedal pulses present  Plan: -Diabetic foot examination performed today. -Continue foot and shoe inspections daily. Monitor blood glucose per PCP/Endocrinologist's recommendations. -Mycotic toenails 1-5 bilaterally were debrided in length and girth with sterile nail nippers and dremel without incident. -Callus(es) submet head 1 b/l and submet head 4 right foot pared utilizing sterile scalpel blade without complication or incident. Total number debrided =3. -Painful porokeratotic lesion(s) L 5th toe pared and enucleated with sterile scalpel blade without incident. Total number of lesions debrided=1. -Patient/POA to call should there be question/concern in the interim.  Return in about 3 months (around 01/17/2022).  Marzetta Board, DPM

## 2021-10-28 ENCOUNTER — Ambulatory Visit: Payer: HMO

## 2021-10-28 NOTE — Chronic Care Management (AMB) (Signed)
   10/28/2021  Lindsey Marquez 05-06-1951 [id number]   Care Management   Follow Up Note   10/28/2021 Name: Lindsey Marquez MRN: [id number] DOB: 1951-02-10   Referred by: Tower, Wynelle Fanny, MD Reason for referral : Chronic Care Management (Initial assessment)   Successful contact was made with the patient to discuss care management and care coordination services. Patient declines engagement at this time.   Follow Up Plan: The patient has been provided with contact information for the care management team and has been advised to call with any health related questions or concerns.   Quinn Plowman RN,BSN,CCM RN Case Manager [employer]  [phone]

## 2021-12-09 ENCOUNTER — Encounter: Payer: Self-pay | Admitting: Pharmacist

## 2021-12-09 NOTE — Progress Notes (Signed)
[facility])                                            [place] Team                                        Statin Quality Measure Assessment    12/09/2021  Lindsey Marquez 01-17-1951 [id number]   Per review of chart and payor information, patient has a diagnosis of diabetes but is not filling a statin prescription.  This places patient into the SUPD (Statin Use In Patients with Diabetes) measure for CMS.    Patient has an active prescription for Atorvastatin 10 mg Take 1/2 tablet (5 mg) PO daily   The 10-year ASCVD risk score (Arnett DK, et al., 2019) is: 45.6%   Values used to calculate the score:     Age: 71 years     Sex: Female     Is Non-Hispanic African American: Yes     Diabetic: Yes     Tobacco smoker: Yes     Systolic Blood Pressure: 616 mmHg     Is BP treated: Yes     HDL Cholesterol: 50.4 mg/dL     Total Cholesterol: 195 mg/dL 03/29/2019     Component Value Date/Time   CHOL 195 04/15/2020 1127   TRIG 243.0 (H) 04/15/2020 1127   HDL 50.40 04/15/2020 1127   CHOLHDL 4 04/15/2020 1127   VLDL 48.6 (H) 04/15/2020 1127   LDLCALC 66 03/29/2019 0931   LDLDIRECT 113.0 04/15/2020 1127    Please consider ONE of the following recommendations:  Initiate high intensity statin Atorvastatin 40mg  once daily, #90, 3 refills   Rosuvastatin 20mg  once daily, #90, 3 refills    Initiate moderate intensity          statin with reduced frequency if prior          statin intolerance 1x weekly, #13, 3 refills   2x weekly, #26, 3 refills   3x weekly, #39, 3 refills    Code for past statin intolerance or  other exclusions (required annually)  Provider Requirements: Associate code during an office visit or telehealth encounter  Drug Induced Myopathy G72.0   Myopathy, unspecified G72.9   Myositis, unspecified M60.9   Rhabdomyolysis M62.82   Cirrhosis of liver K74.69   Prediabetes R73.03   PCOS E28.2   1)  Contact patient  2) Notify provider prior to upcoming appointment   Loretha Brasil, PharmD [place] Pharmacist Office: [phone]

## 2021-12-10 ENCOUNTER — Telehealth: Payer: Self-pay | Admitting: Family Medicine

## 2021-12-10 DIAGNOSIS — N1831 Chronic kidney disease, stage 3a: Secondary | ICD-10-CM

## 2021-12-10 DIAGNOSIS — E1165 Type 2 diabetes mellitus with hyperglycemia: Secondary | ICD-10-CM

## 2021-12-10 DIAGNOSIS — E7800 Pure hypercholesterolemia, unspecified: Secondary | ICD-10-CM

## 2021-12-10 DIAGNOSIS — Z794 Long term (current) use of insulin: Secondary | ICD-10-CM

## 2021-12-10 DIAGNOSIS — I10 Essential (primary) hypertension: Secondary | ICD-10-CM

## 2021-12-10 NOTE — Telephone Encounter (Signed)
-----   Message from Ellamae Sia sent at 11/27/2021  2:56 PM EST ----- Regarding: Lab orders for Thursday, 1.12.23 Patient is scheduled for CPX labs, please order future labs, Thanks , Karna Christmas

## 2021-12-11 ENCOUNTER — Other Ambulatory Visit: Payer: HMO

## 2021-12-18 ENCOUNTER — Encounter: Payer: HMO | Admitting: Family Medicine

## 2021-12-25 ENCOUNTER — Ambulatory Visit (INDEPENDENT_AMBULATORY_CARE_PROVIDER_SITE_OTHER): Payer: No Typology Code available for payment source | Admitting: Family Medicine

## 2021-12-25 ENCOUNTER — Encounter: Payer: Self-pay | Admitting: Family Medicine

## 2021-12-25 ENCOUNTER — Other Ambulatory Visit: Payer: Self-pay

## 2021-12-25 VITALS — BP 130/64 | HR 97 | Temp 97.3°F | Ht 68.0 in | Wt 222.1 lb

## 2021-12-25 DIAGNOSIS — E1165 Type 2 diabetes mellitus with hyperglycemia: Secondary | ICD-10-CM

## 2021-12-25 DIAGNOSIS — Z6833 Body mass index (BMI) 33.0-33.9, adult: Secondary | ICD-10-CM | POA: Diagnosis not present

## 2021-12-25 DIAGNOSIS — E785 Hyperlipidemia, unspecified: Secondary | ICD-10-CM | POA: Diagnosis not present

## 2021-12-25 DIAGNOSIS — Z0000 Encounter for general adult medical examination without abnormal findings: Secondary | ICD-10-CM

## 2021-12-25 DIAGNOSIS — Z794 Long term (current) use of insulin: Secondary | ICD-10-CM

## 2021-12-25 DIAGNOSIS — E1169 Type 2 diabetes mellitus with other specified complication: Secondary | ICD-10-CM

## 2021-12-25 DIAGNOSIS — N1831 Chronic kidney disease, stage 3a: Secondary | ICD-10-CM | POA: Diagnosis not present

## 2021-12-25 DIAGNOSIS — G620 Drug-induced polyneuropathy: Secondary | ICD-10-CM | POA: Diagnosis not present

## 2021-12-25 DIAGNOSIS — E6609 Other obesity due to excess calories: Secondary | ICD-10-CM

## 2021-12-25 DIAGNOSIS — I10 Essential (primary) hypertension: Secondary | ICD-10-CM | POA: Diagnosis not present

## 2021-12-25 DIAGNOSIS — E66811 Obesity, class 1: Secondary | ICD-10-CM

## 2021-12-25 DIAGNOSIS — E7800 Pure hypercholesterolemia, unspecified: Secondary | ICD-10-CM | POA: Diagnosis not present

## 2021-12-25 LAB — CBC WITH DIFFERENTIAL/PLATELET
Basophils Absolute: 0.1 10*3/uL (ref 0.0–0.1)
Basophils Relative: 0.6 % (ref 0.0–3.0)
Eosinophils Absolute: 0.2 10*3/uL (ref 0.0–0.7)
Eosinophils Relative: 2.2 % (ref 0.0–5.0)
HCT: 35.4 % — ABNORMAL LOW (ref 36.0–46.0)
Hemoglobin: 11.5 g/dL — ABNORMAL LOW (ref 12.0–15.0)
Lymphocytes Relative: 30.5 % (ref 12.0–46.0)
Lymphs Abs: 2.6 10*3/uL (ref 0.7–4.0)
MCHC: 32.7 g/dL (ref 30.0–36.0)
MCV: 100.2 fl — ABNORMAL HIGH (ref 78.0–100.0)
Monocytes Absolute: 0.6 10*3/uL (ref 0.1–1.0)
Monocytes Relative: 7.2 % (ref 3.0–12.0)
Neutro Abs: 5 10*3/uL (ref 1.4–7.7)
Neutrophils Relative %: 59.5 % (ref 43.0–77.0)
Platelets: 391 10*3/uL (ref 150.0–400.0)
RBC: 3.53 Mil/uL — ABNORMAL LOW (ref 3.87–5.11)
RDW: 13.1 % (ref 11.5–15.5)
WBC: 8.4 10*3/uL (ref 4.0–10.5)

## 2021-12-25 LAB — LIPID PANEL
Cholesterol: 189 mg/dL (ref 0–200)
HDL: 51.6 mg/dL (ref >39.00)
LDL Cholesterol: 108 mg/dL — ABNORMAL HIGH (ref 0–99)
NonHDL: 136.94
Total CHOL/HDL Ratio: 4
Triglycerides: 143 mg/dL (ref 0.0–149.0)
VLDL: 28.6 mg/dL (ref 0.0–40.0)

## 2021-12-25 LAB — COMPREHENSIVE METABOLIC PANEL
ALT: 25 U/L (ref 0–35)
AST: 24 U/L (ref 0–37)
Albumin: 4.5 g/dL (ref 3.5–5.2)
Alkaline Phosphatase: 60 U/L (ref 39–117)
BUN: 23 mg/dL (ref 6–23)
CO2: 27 mEq/L (ref 19–32)
Calcium: 10.1 mg/dL (ref 8.4–10.5)
Chloride: 104 mEq/L (ref 96–112)
Creatinine, Ser: 0.95 mg/dL (ref 0.40–1.20)
GFR: 60.67 mL/min (ref >60.00)
Glucose, Bld: 98 mg/dL (ref 70–99)
Potassium: 4.9 mEq/L (ref 3.5–5.1)
Sodium: 138 mEq/L (ref 135–145)
Total Bilirubin: 0.4 mg/dL (ref 0.2–1.2)
Total Protein: 8 g/dL (ref 6.0–8.3)

## 2021-12-25 LAB — TSH: TSH: 2.54 u[IU]/mL (ref 0.35–5.50)

## 2021-12-25 MED ORDER — LISINOPRIL-HYDROCHLOROTHIAZIDE 20-25 MG PO TABS: 1.0000 | ORAL_TABLET | Freq: Every day | ORAL | 3 refills | Status: DC

## 2021-12-25 MED ORDER — SCOPOLAMINE 1 MG/3DAYS TD PT72: 1.0000 | MEDICATED_PATCH | TRANSDERMAL | 12 refills | Status: DC

## 2021-12-25 NOTE — Progress Notes (Signed)
Subjective:    Patient ID: Lindsey Marquez, female    DOB: [birth year], 71 y.o.   MRN: [id number]  This visit occurred during the SARS-CoV-2 public health emergency.  Safety protocols were in place, including screening questions prior to the visit, additional usage of staff PPE, and extensive cleaning of exam room while observing appropriate contact time as indicated for disinfecting solutions.   HPI Here for health maintenance exam and to review chronic medical problems    Wt Readings from Last 3 Encounters:  12/25/21 222 lb 2 oz (100.8 kg)  09/17/21 221 lb 12.8 oz (100.6 kg)  08/28/21 223 lb 4.8 oz (101.3 kg)   33.77 kg/m  Doing ok  Helps with grand kids  Very busy   Makes time for self care  Weight is coming down -after the holidays at home  Avoids sweets  Mindful of what she eats   Exercise- not a lot  Needs to work on it  Thinking about [employer] or [employer] [facility]   Zoster status : shingrix 2021 Covid immunized  Prevnar 09/2017  Pna 23 01/2016  Tdap 08/2012  Flu shot 08/2021  Mammogram 06/2021 Personal h/o breast cancer -oncology care  Taking letrozole  , tolerates ok  Self breast exam: no changes   Colonoscopy 08/2017   Dexa 05/2019 -normal  Falls : she slipped getting into sisters car/got into funny position  Fractures: none  Supplements : vitamin d  Exercise :  plans to    Eye exam 04/2021  HTN bp is stable today  No cp or palpitations or headaches or edema  No side effects to medicines  BP Readings from Last 3 Encounters:  12/25/21 130/64  09/17/21 130/78  08/28/21 (!) 162/72    Lisinopril hct 20-25 mg daily   CKD Lab Results  Component Value Date   CREATININE 1.00 08/28/2021   BUN 19 08/28/2021   NA 140 08/28/2021   K 4.7 08/28/2021   CL 106 08/28/2021   CO2 22 08/28/2021   Due for lab  DM2 Lab Results  Component Value Date   HGBA1C 6.6 (A) 09/17/2021   Under care of Dr Renne Crigler   Hyperlipidemia Lab  Results  Component Value Date   CHOL 195 04/15/2020   HDL 50.40 04/15/2020   LDLCALC 66 03/29/2019   LDLDIRECT 113.0 04/15/2020   TRIG 243.0 (H) 04/15/2020   CHOLHDL 4 04/15/2020  Atorvastatin 5 mg daily     H/o anemia of chronic dz Lab Results  Component Value Date   WBC 7.2 08/28/2021   HGB 11.3 (L) 08/28/2021   HCT 33.7 (L) 08/28/2021   MCV 100.3 (H) 08/28/2021   PLT 339 08/28/2021   Lab Results  Component Value Date   IRON 107 02/26/2021   TIBC 388 02/26/2021   FERRITIN 140 02/26/2021   Followed by onc   Has neuropathy in legs  Going on a cruise next [facility] like to have sea sickness patches if needed   Patient Active Problem List   Diagnosis Date Noted   Routine general medical examination at a health care facility 12/25/2021   Fall 07/17/2021   Bilateral hip pain 07/17/2021   Abnormal urine odor 07/17/2021   Urinary tract infection with hematuria 07/17/2021   CKD (chronic kidney disease) stage 3, GFR 30-59 ml/min ([place]) 06/05/2021   Screening for malignant neoplasm of colon 02/17/2021   History of colonic polyps 11/18/2020   Drug-induced polyneuropathy ([place]) 04/15/2020   Osteoarthritis 04/15/2020  Hyperlipidemia associated with type 2 diabetes mellitus ([place]) 09/20/2018   Class 1 obesity due to excess calories with serious comorbidity and body mass index (BMI) of 33.0 to 33.9 in adult 09/20/2018   Cancer of central portion of left breast ([place]) 05/07/2016   HTN (hypertension) 03/24/2016   Type 2 diabetes mellitus with hyperglycemia, with long-term current use of insulin ([place]) 03/24/2016   Past Medical History:  Diagnosis Date   Blood transfusion without reported diagnosis    Breast cancer [facility]) August 2002   Invasive ductal carcinoma Left breast. 2002, 2017   Diabetes mellitus without complication ([place])    Family history of malignant neoplasm of ovary    Family history of pancreatic cancer    Hypertension    Past Surgical History:  Procedure  Laterality Date   BREAST SURGERY Left 2003   COLONOSCOPY     FOOT SURGERY  2000   INSERTION OF MESH N/A 02/17/2018   Procedure: INSERTION OF MESH;  Surgeon: [name], MD;  Location: [place];  Service: General;  Laterality: N/A;   MASTECTOMY Left 05/28/2016   port a cath insertion  05/28/2016   PORT-A-CATH REMOVAL Right 08/19/2017   Procedure: REMOVAL PORT-A-CATH;  Surgeon: [name], MD;  Location: [place];  Service: General;  Laterality: Right;   PORTACATH PLACEMENT Right 05/28/2016   Procedure: INSERTION PORT-A-CATH;  Surgeon: [name], MD;  Location: [place];  Service: General;  Laterality: Right;   TOTAL MASTECTOMY Left 05/28/2016   Procedure: LEFT  MASTECTOMY;  Surgeon: [name], MD;  Location: [place];  Service: General;  Laterality: Left;   TUBAL LIGATION  22 yrs since  2004.   Bilateral.   VENTRAL HERNIA REPAIR  02/17/2018   open   VENTRAL HERNIA REPAIR N/A 02/17/2018   Procedure: OPEN VENTRAL HERNIA REPAIR WITH MESH;  Surgeon: [name], MD;  Location: [place];  Service: General;  Laterality: N/A;   Social History   Tobacco Use   Smoking status: Former    Packs/day: 0.10    Years: 7.00    Pack years: 0.70    Types: Cigarettes    Quit date: 12/01/1975    Years since quitting: 46.1   Smokeless tobacco: Never  Vaping Use   Vaping Use: Never used  Substance Use Topics   Alcohol use: No    Alcohol/week: 0.0 standard drinks   Drug use: No   Family History  Problem Relation Age of Onset   Prostate cancer Father    Ovarian cancer Maternal Aunt 68       deceased   Pancreatic cancer Mother 54       deceased   Cancer Maternal Aunt        2 other mat aunts; unk. primary in 81s; deceased   Cancer Maternal Uncle        "bone ca"; unk. primary; deceased 66s   Prostate cancer Maternal Uncle        deceased 9   Colon cancer Neg Hx    Esophageal cancer Neg Hx    Rectal cancer Neg Hx    Stomach cancer Neg Hx    Allergies  Allergen  Reactions   Codeine Other (See Comments)    "get crazy", "see things that aren't there"   Current Outpatient Medications on File Prior to Visit  Medication Sig Dispense Refill   aspirin 81 MG tablet Take 81 mg by mouth daily as needed (leg pain).     atorvastatin (LIPITOR) 10 MG tablet  Take 0.5 tablets (5 mg total) by mouth daily. 45 tablet 3   BD PEN NEEDLE NANO 2ND GEN 32G X 4 MM MISC USE 1 PEN NEEDLE 2 (TWO) TIMES DAILY. 200 each 1   Blood Glucose Monitoring Suppl (ONETOUCH VERIO) w/Device KIT Use to check blood sugar 3 times a day. E11.9 1 kit 0   dapagliflozin propanediol (FARXIGA) 10 MG TABS tablet Take 1 tablet (10 mg total) by mouth daily. 90 tablet 3   Fish Oil-Cholecalciferol (FISH OIL + D3 PO)      gabapentin (NEURONTIN) 300 MG capsule Take 2 capsules (600 mg total) by mouth at bedtime. 60 capsule 5   Insulin Glargine (BASAGLAR KWIKPEN) 100 UNIT/ML Inject under skin 30 units daily 30 mL 3   letrozole (FEMARA) 2.5 MG tablet Take 1 tablet (2.5 mg total) by mouth daily. 90 tablet 3   metFORMIN (GLUCOPHAGE) 1000 MG tablet Take 1 tablet (1,000 mg total) by mouth 2 (two) times daily with a meal. 180 tablet 3   NON FORMULARY Peripheral Neuropathy cream from [place] Acids (FISH OIL) 1200 MG CAPS Take 1,200 mg by mouth daily.      ONETOUCH VERIO test strip USE TO CHECK BLOOD SUGAR 3 TIMES A DAY. E11.9 300 strip 4   TRUEPLUS LANCETS 33G MISC 1 each by Does not apply route 3 (three) times daily. 600 each 2   No current facility-administered medications on file prior to visit.    Review of Systems  Constitutional:  Negative for activity change, appetite change, fatigue, fever and unexpected weight change.  HENT:  Negative for congestion, ear pain, rhinorrhea, sinus pressure and sore throat.   Eyes:  Negative for pain, redness and visual disturbance.  Respiratory:  Negative for cough, shortness of breath and wheezing.   Cardiovascular:  Negative for chest pain  and palpitations.  Gastrointestinal:  Negative for abdominal pain, blood in stool, constipation and diarrhea.  Endocrine: Negative for polydipsia and polyuria.  Genitourinary:  Negative for dysuria, frequency and urgency.  Musculoskeletal:  Negative for arthralgias, back pain and myalgias.  Skin:  Negative for pallor and rash.  Allergic/Immunologic: Negative for environmental allergies.  Neurological:  Positive for numbness. Negative for dizziness, syncope and headaches.       May need motion sickness patches for a cruise  Hematological:  Negative for adenopathy. Does not bruise/bleed easily.  Psychiatric/Behavioral:  Negative for decreased concentration and dysphoric mood. The patient is not nervous/anxious.       Objective:   Physical Exam Constitutional:      General: She is not in acute distress.    Appearance: Normal appearance. She is well-developed. She is not ill-appearing or diaphoretic.  HENT:     Head: Normocephalic and atraumatic.     Right Ear: Tympanic membrane, ear canal and external ear normal.     Left Ear: Tympanic membrane, ear canal and external ear normal.     Nose: Nose normal. No congestion.     Mouth/Throat:     Mouth: Mucous membranes are moist.     Pharynx: Oropharynx is clear. No posterior oropharyngeal erythema.  Eyes:     General: No scleral icterus.    Extraocular Movements: Extraocular movements intact.     Conjunctiva/sclera: Conjunctivae normal.     Pupils: Pupils are equal, round, and reactive to light.  Neck:     Thyroid: No thyromegaly.     Vascular: No carotid bruit or JVD.  Cardiovascular:  Rate and Rhythm: Normal rate and regular rhythm.     Pulses: Normal pulses.     Heart sounds: Normal heart sounds.    No gallop.  Pulmonary:     Effort: Pulmonary effort is normal. No respiratory distress.     Breath sounds: Normal breath sounds. No wheezing.     Comments: Good air exch Chest:     Chest wall: No tenderness.  Abdominal:      General: Bowel sounds are normal. There is no distension or abdominal bruit.     Palpations: Abdomen is soft. There is no mass.     Tenderness: There is no abdominal tenderness.     Hernia: No hernia is present.  Genitourinary:    Comments: Breast exam Right :No mass, nodules, thickening, tenderness, bulging, retraction, inflamation, nipple discharge or skin changes noted.  No axillary or clavicular LA.      L mastectomy site looks unremarkable  Nl axillary exam Musculoskeletal:        General: No tenderness. Normal range of motion.     Cervical back: Normal range of motion and neck supple. No rigidity. No muscular tenderness.     Right lower leg: No edema.     Left lower leg: No edema.  Lymphadenopathy:     Cervical: No cervical adenopathy.  Skin:    General: Skin is warm and dry.     Coloration: Skin is not pale.     Findings: No erythema or rash.     Comments: Few lentigines and tags  Neurological:     Mental Status: She is alert. Mental status is at baseline.     Cranial Nerves: No cranial nerve deficit.     Motor: No abnormal muscle tone.     Coordination: Coordination normal.     Gait: Gait normal.     Deep Tendon Reflexes: Reflexes are normal and symmetric. Reflexes normal.  Psychiatric:        Mood and Affect: Mood normal.        Cognition and Memory: Cognition and memory normal.          Assessment & Plan:   Problem List Items Addressed This Visit       Cardiovascular and Mediastinum   HTN (hypertension)    bp in fair control at this time  BP Readings from Last 1 Encounters:  12/25/21 130/64  No changes needed Most recent labs reviewed  Disc lifstyle change with low sodium diet and exercise  Plan to continue lisinoprol hct 20-25 mg once daily  Labs ordered      Relevant Medications   lisinopril-hydrochlorothiazide (ZESTORETIC) 20-25 MG tablet     Endocrine   Type 2 diabetes mellitus with hyperglycemia, with long-term current use of insulin (H[facility])     Under care of Dr Cruzita Lederer Last a1c was 6.6  Fairly well controled /she also works on diet and exercise       Relevant Medications   lisinopril-hydrochlorothiazide (ZESTORETIC) 20-25 MG tablet   Hyperlipidemia associated with type 2 diabetes mellitus ([place])    Lipids ordered  Disc goals for lipids and reasons to control them Last LDL was under 70 Rev last labs with pt Rev low sat fat diet in detail Plan to continue atorvastatin 5 mg daily        Relevant Medications   lisinopril-hydrochlorothiazide (ZESTORETIC) 20-25 MG tablet     Nervous and Auditory   Drug-induced polyneuropathy ([facility]CC)     Genitourinary   CKD (chronic kidney disease) stage  3, GFR 30-59 ml/min ([facility])     Other   Class 1 obesity due to excess calories with serious comorbidity and body mass index (BMI) of 33.0 to 33.9 in adult    Discussed how this problem influences overall health and the risks it imposes  Reviewed plan for weight loss with lower calorie diet (via better food choices and also portion control or program like weight watchers) and exercise building up to or more than 30 minutes 5 days per week including some aerobic activity         Routine general medical examination at a health care facility - Primary    Reviewed health habits including diet and exercise and skin cancer prevention Reviewed appropriate screening tests for age  Also reviewed health mt list, fam hx and immunization status , as well as social and family history   See HPI Labs ordered  Due for tetanus shot-she will likely get at a pharmacy Mammogram utd (past breast cancer) dexa is due/oncology will order this, taking letrozole, no fractures and takes D Discussed plan for exercise  Discussed fall prevention

## 2021-12-25 NOTE — Patient Instructions (Addendum)
Work up to 30 minutes of exercise daily    You are due for a tetanus shot this year- you can get that at the pharmacy     Take care of yourself  Keep working on a healthy diet   Your oncologist will likely order a bone density test  If not- we can order that for you

## 2021-12-26 NOTE — Assessment & Plan Note (Signed)
Under care of Dr Cruzita Lederer Last a1c was 6.6  Fairly well controled /she also works on diet and exercise

## 2021-12-26 NOTE — Assessment & Plan Note (Addendum)
Reviewed health habits including diet and exercise and skin cancer prevention Reviewed appropriate screening tests for age  Also reviewed health mt list, fam hx and immunization status , as well as social and family history   See HPI Labs ordered  Due for tetanus shot-she will likely get at a pharmacy Mammogram utd (past breast cancer) dexa is due/oncology will order this, taking letrozole, no fractures and takes D Discussed plan for exercise  Discussed fall prevention

## 2021-12-26 NOTE — Assessment & Plan Note (Signed)
Lipids ordered  Disc goals for lipids and reasons to control them Last LDL was under 70 Rev last labs with pt Rev low sat fat diet in detail Plan to continue atorvastatin 5 mg daily

## 2021-12-26 NOTE — Assessment & Plan Note (Signed)
Discussed how this problem influences overall health and the risks it imposes  Reviewed plan for weight loss with lower calorie diet (via better food choices and also portion control or program like weight watchers) and exercise building up to or more than 30 minutes 5 days per week including some aerobic activity    

## 2021-12-26 NOTE — Assessment & Plan Note (Signed)
bp in fair control at this time  BP Readings from Last 1 Encounters:  12/25/21 130/64   No changes needed Most recent labs reviewed  Disc lifstyle change with low sodium diet and exercise  Plan to continue lisinoprol hct 20-25 mg once daily  Labs ordered

## 2022-01-06 ENCOUNTER — Other Ambulatory Visit: Payer: HMO

## 2022-01-19 ENCOUNTER — Other Ambulatory Visit: Payer: Self-pay

## 2022-01-19 ENCOUNTER — Encounter: Payer: Self-pay | Admitting: Internal Medicine

## 2022-01-19 ENCOUNTER — Ambulatory Visit (INDEPENDENT_AMBULATORY_CARE_PROVIDER_SITE_OTHER): Payer: No Typology Code available for payment source | Admitting: Internal Medicine

## 2022-01-19 VITALS — BP 118/70 | HR 94 | Ht 68.0 in | Wt 219.0 lb

## 2022-01-19 DIAGNOSIS — E785 Hyperlipidemia, unspecified: Secondary | ICD-10-CM

## 2022-01-19 DIAGNOSIS — E669 Obesity, unspecified: Secondary | ICD-10-CM | POA: Diagnosis not present

## 2022-01-19 DIAGNOSIS — E1165 Type 2 diabetes mellitus with hyperglycemia: Secondary | ICD-10-CM | POA: Diagnosis not present

## 2022-01-19 DIAGNOSIS — E66812 Obesity, class 2: Secondary | ICD-10-CM

## 2022-01-19 DIAGNOSIS — Z794 Long term (current) use of insulin: Secondary | ICD-10-CM | POA: Diagnosis not present

## 2022-01-19 LAB — POCT GLYCOSYLATED HEMOGLOBIN (HGB A1C): Hemoglobin A1C: 6.2 % — AB (ref 4.0–5.6)

## 2022-01-19 MED ORDER — ONETOUCH VERIO W/DEVICE KIT: PACK | 0 refills | Status: DC

## 2022-01-19 MED ORDER — ATORVASTATIN CALCIUM 20 MG PO TABS: 20.0000 mg | ORAL_TABLET | Freq: Every day | ORAL | 3 refills | Status: DC

## 2022-01-19 NOTE — Progress Notes (Signed)
Patient ID: Lindsey Marquez, female   DOB: 05/18/1951, 71 y.o.   MRN: [id number]   This visit occurred during the SARS-CoV-2 public health emergency.  Safety protocols were in place, including screening questions prior to the visit, additional usage of staff PPE, and extensive cleaning of exam room while observing appropriate contact time as indicated for disinfecting solutions.   HPI: Lindsey Marquez is a 71 y.o.-year-old female, returning for follow-up for DM2, dx in ~2001, insulin-dependent, uncontrolled, without long-term complications. Last visit 4 months ago.  Interim history: No increased urination, blurry vision, nausea, chest pain. 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  On letrozole. She started to go to the gym few days ago.  Reviewed HbA1c levels: Lab Results  Component Value Date   HGBA1C 6.6 (A) 09/17/2021   HGBA1C 6.9 (A) 06/12/2021   HGBA1C 9.1 (A) 03/04/2021   HGBA1C 7.9 (A) 10/28/2020   HGBA1C 8.4 (H) 04/15/2020   HGBA1C 10.4 (A) 10/04/2019   HGBA1C 9.8 (H) 12/20/2018   HGBA1C 9.4 (A) 09/20/2018   HGBA1C 8.8 (H) 04/05/2018   HGBA1C 7.5 (H) 02/10/2018   HGBA1C 7.4 (H) 01/04/2018   HGBA1C 8.0 (H) 10/04/2017   HGBA1C 6.9 (H) 12/28/2016   HGBA1C 8.0 (H) 09/24/2016   HGBA1C 7.8 (H) 05/26/2016   HGBA1C 9.1 01/27/2016   Pt is on: - Basaglar 20 >> 30 >> 34-36 >> 20 >> 30 units at bedtime - Metformin 1000 mg 2x a day with meals  - Farxiga 10 mg daily before b'fast >> restarted at last visit GLP1 R agonists were not affordable.  Pt checks her sugars once a day: - am 95-126, 139 >> 93-117, 125, 134 >> 96-137, 160 (candy) - 2h after b'fast: n/c - before lunch:125-142 >> n/c >> 93-98, 122 >> 90s-101 - 2h after lunch: n/c >> 344 >> n/c >> 80-174 >> n/c - before dinner: 180-195 >> 200s >> 103-145, 174 >> n/c - 2h after dinner: 112-191, 216 >> 124-156 >> n/c >> 100-127 - bedtime:  115-137, 182 >> 92-124, 146 >> n/c - nighttime: n/c Lowest sugar was 103  >> 116 >> 80 >> 96; she has hypoglycemia awareness in the 70s. Highest sugar was 216 >> 167 >> 182 >> 146.  Glucometer: True metrix (we cannot download this)  Pt's meals are: - Breakfast:coffee, mc muffin, egg - Lunch: [place] sandwich - Dinner: baked chicken, veggies,dessert - apples, - Snacks: yoghurt, pretzels, PB  -No CKD, last BUN/creatinine:  Lab Results  Component Value Date   BUN 23 12/25/2021   BUN 19 08/28/2021   CREATININE 0.95 12/25/2021   CREATININE 1.00 08/28/2021  On lisinopril 10.  -+ HL; last set of lipids: Lab Results  Component Value Date   CHOL 189 12/25/2021   HDL 51.60 12/25/2021   LDLCALC 108 (H) 12/25/2021   LDLDIRECT 113.0 04/15/2020   TRIG 143.0 12/25/2021   CHOLHDL 4 12/25/2021  On Lipitor 10 mg, fish oil.  - last eye exam was 04/2021: No DR reportedly.  - no numbness and tingling in her feet.  She is up-to-date with foot exam (09/2021). On ASA 81.  Pt has FH of DM in brother, sister, father.  + h/o BrCA in 2013, recurrence in 2018.  She is cancer free.    ROS: + See HPI  Current Outpatient Medications on File Prior to Visit  Medication Sig   aspirin 81 MG tablet Take 81 mg by mouth daily as needed (leg pain).  atorvastatin (LIPITOR) 10 MG tablet Take 0.5 tablets (5 mg total) by mouth daily.   BD PEN NEEDLE NANO 2ND GEN 32G X 4 MM MISC USE 1 PEN NEEDLE 2 (TWO) TIMES DAILY.   Blood Glucose Monitoring Suppl (ONETOUCH VERIO) w/Device KIT Use to check blood sugar 3 times a day. E11.9   dapagliflozin propanediol (FARXIGA) 10 MG TABS tablet Take 1 tablet (10 mg total) by mouth daily.   Fish Oil-Cholecalciferol (FISH OIL + D3 PO)    gabapentin (NEURONTIN) 300 MG capsule Take 2 capsules (600 mg total) by mouth at bedtime.   Insulin Glargine (BASAGLAR KWIKPEN) 100 UNIT/ML Inject under skin 30 units daily   letrozole (FEMARA) 2.5 MG tablet Take 1 tablet (2.5 mg total) by mouth daily.   lisinopril-hydrochlorothiazide (ZESTORETIC) 20-25 MG tablet  Take 1 tablet by mouth daily.   metFORMIN (GLUCOPHAGE) 1000 MG tablet Take 1 tablet (1,000 mg total) by mouth 2 (two) times daily with a meal.   NON FORMULARY Peripheral Neuropathy cream from [employer] (FISH OIL) 1200 MG CAPS Take 1,200 mg by mouth daily.    ONETOUCH VERIO test strip USE TO CHECK BLOOD SUGAR 3 TIMES A DAY. E11.9   scopolamine (TRANSDERM-SCOP) 1 MG/3DAYS Place 1 patch (1.5 mg total) onto the skin every 3 (three) days.   TRUEPLUS LANCETS 33G MISC 1 each by Does not apply route 3 (three) times daily.   No current facility-administered medications on file prior to visit.   Past Medical History:  Diagnosis Date   Blood transfusion without reported diagnosis    Breast cancer [facility]) August 2002   Invasive ductal carcinoma Left breast. 2002, 2017   Diabetes mellitus without complication ([place])    Family history of malignant neoplasm of ovary    Family history of pancreatic cancer    Hypertension    Past Surgical History:  Procedure Laterality Date   BREAST SURGERY Left 2003   COLONOSCOPY     FOOT SURGERY  2000   INSERTION OF MESH N/A 02/17/2018   Procedure: INSERTION OF MESH;  Surgeon: [name], MD;  Location: [place];  Service: General;  Laterality: N/A;   MASTECTOMY Left 05/28/2016   port a cath insertion  05/28/2016   PORT-A-CATH REMOVAL Right 08/19/2017   Procedure: REMOVAL PORT-A-CATH;  Surgeon: [name], MD;  Location: [place];  Service: General;  Laterality: Right;   PORTACATH PLACEMENT Right 05/28/2016   Procedure: INSERTION PORT-A-CATH;  Surgeon: [name], MD;  Location: [place];  Service: General;  Laterality: Right;   TOTAL MASTECTOMY Left 05/28/2016   Procedure: LEFT  MASTECTOMY;  Surgeon: [name], MD;  Location: [place];  Service: General;  Laterality: Left;   TUBAL LIGATION  22 yrs since  2004.   Bilateral.   VENTRAL HERNIA REPAIR  02/17/2018   open   VENTRAL HERNIA REPAIR N/A 02/17/2018    Procedure: OPEN VENTRAL HERNIA REPAIR WITH MESH;  Surgeon: [name], MD;  Location: [place];  Service: General;  Laterality: N/A;   Social History   Socioeconomic History   Marital status: Divorced    Spouse name: Not on file   Number of children: Not on file   Years of education: Not on file   Highest education level: Not on file  Occupational History   Not on file  Tobacco Use   Smoking status: Former    Packs/day: 0.10    Years: 7.00    Pack years: 0.70  Types: Cigarettes    Quit date: 12/01/1975    Years since quitting: 46.1   Smokeless tobacco: Never  Vaping Use   Vaping Use: Never used  Substance and Sexual Activity   Alcohol use: No    Alcohol/week: 0.0 standard drinks   Drug use: No   Sexual activity: Yes  Other Topics Concern   Not on file  Social History Narrative   Not on file   Social Determinants of Health   Financial Resource Strain: Not on file  Food Insecurity: Not on file  Transportation Needs: Not on file  Physical Activity: Not on file  Stress: Not on file  Social Connections: Not on file  Intimate Partner Violence: Not on file    Allergies  Allergen Reactions   Codeine Other (See Comments)    "get crazy", "see things that aren't there"   Family History  Problem Relation Age of Onset   Prostate cancer Father    Ovarian cancer Maternal Aunt 32       deceased   Pancreatic cancer Mother 45       deceased   Cancer Maternal Aunt        2 other mat aunts; unk. primary in 44s; deceased   Cancer Maternal Uncle        "bone ca"; unk. primary; deceased 85s   Prostate cancer Maternal Uncle        deceased 35   Colon cancer Neg Hx    Esophageal cancer Neg Hx    Rectal cancer Neg Hx    Stomach cancer Neg Hx     PE:  BP 118/70 (BP Location: Right Arm, Patient Position: Sitting, Cuff Size: Normal)    Pulse 94    Ht 5' 8"  (1.727 m)    Wt 219 lb (99.3 kg)    SpO2 97%    BMI 33.30 kg/m  Wt Readings from Last 3 Encounters:  01/19/22 219 lb  (99.3 kg)  12/25/21 222 lb 2 oz (100.8 kg)  09/17/21 221 lb 12.8 oz (100.6 kg)   Constitutional: overweight, in NAD Eyes: PERRLA, EOMI, no exophthalmos ENT: moist mucous membranes, no thyromegaly, no cervical lymphadenopathy Cardiovascular: tachycardia, RR, No MRG Respiratory: CTA B Musculoskeletal: no deformities, strength intact in all 4 Skin: moist, warm, no rashes Neurological: no tremor with outstretched hands, DTR normal in all 4  ASSESSMENT: 1. DM2, insulin-dependent, uncontrolled, without long-term complications, but with hyperglycemia  2. HL  3.  Obesity class II  PLAN:  1. Patient with longstanding, uncontrolled, type 2 diabetes, on oral antidiabetic regimen with metformin and SGLT2 inhibitor and also long-acting insulin.  In the past, she was able to come off insulin after her sugars improved, but we had to start this back in 2020.  At last visit, HbA1c were almost all at goal, with very few only slightly hyperglycemic exceptions.  This was an improvement from the previous visit, due to improvement in her diet.  We did not change the regimen at that time but discussed about possibly decreasing her Basaglar dose after the holidays.  HbA1c at that time was 6.6%, improved. -At today's visit, sugars remain at goal, with very few mildly hyperglycemic exceptions.  Our plan was to start decreasing the Basaglar after the holidays, but based on the reviewed sugars, they are now closer to the upper range of normal.  Therefore, for now, I did not suggest decreasing dose, especially as she also plans to start going to the gym consistently.  - I suggested  to: Patient Instructions  Please continue: - Metformin 1000 mg 2x a day with meals - Farxiga 10 mg daily before b'fast - Basaglar 30 units at bedtime  Please come back for a follow-up appointment in 4 months.  - we checked her HbA1c: 6.2%  (improved) - advised to check sugars at different times of the day - 1x a day, rotating check  times - advised for yearly eye exams >> she is UTD - she is up-to-date with foot exam. - return to clinic in 4 months  2. HL -Reviewed latest lipid panel from last visit: LDL above target, the rest of the fractions at goal: Lab Results  Component Value Date   CHOL 189 12/25/2021   HDL 51.60 12/25/2021   LDLCALC 108 (H) 12/25/2021   LDLDIRECT 113.0 04/15/2020   TRIG 143.0 12/25/2021   CHOLHDL 4 12/25/2021  -She is on Lipitor 10 mg daily, fish oil.  No side effects. -At this visit, we discussed about increasing the Lipitor dose to 20 mg daily since the lipid panel was not at goal.  She agrees with this change.  She has enough 10 mg tablets at home to double up on the dose without needing a new prescription for now.  3.  Obesity class II -We will continue [place] which should also help with weight loss -She lost 10 pounds before the last 2 visits combined - She lost 2 pounds since last visit  Philemon Kingdom, MD PhD [facility]

## 2022-01-19 NOTE — Patient Instructions (Addendum)
Please continue: - Metformin 1000 mg 2x a day with meals - Farxiga 10 mg daily before b'fast - Basaglar 30 units at bedtime  Try to increase Atorvastatin to 20 mg daily.  Please come back for a follow-up appointment in 4 months.

## 2022-01-20 ENCOUNTER — Ambulatory Visit: Payer: No Typology Code available for payment source | Admitting: Internal Medicine

## 2022-01-20 MED ORDER — ATORVASTATIN CALCIUM 20 MG PO TABS: 20.0000 mg | ORAL_TABLET | Freq: Every day | ORAL | 3 refills | Status: DC

## 2022-01-20 NOTE — Addendum Note (Signed)
Addended by: Lauralyn Primes on: 01/20/2022 04:43 PM   Modules accepted: Orders

## 2022-01-28 ENCOUNTER — Encounter: Payer: Self-pay | Admitting: Podiatry

## 2022-01-28 ENCOUNTER — Ambulatory Visit: Payer: BC Managed Care – PPO

## 2022-01-28 ENCOUNTER — Other Ambulatory Visit: Payer: Self-pay

## 2022-01-28 ENCOUNTER — Ambulatory Visit (INDEPENDENT_AMBULATORY_CARE_PROVIDER_SITE_OTHER): Payer: No Typology Code available for payment source | Admitting: Podiatry

## 2022-01-28 DIAGNOSIS — M2032 Hallux varus (acquired), left foot: Secondary | ICD-10-CM

## 2022-01-28 DIAGNOSIS — L84 Corns and callosities: Secondary | ICD-10-CM

## 2022-01-28 DIAGNOSIS — Z794 Long term (current) use of insulin: Secondary | ICD-10-CM

## 2022-01-28 DIAGNOSIS — M2041 Other hammer toe(s) (acquired), right foot: Secondary | ICD-10-CM

## 2022-01-28 DIAGNOSIS — G620 Drug-induced polyneuropathy: Secondary | ICD-10-CM | POA: Diagnosis not present

## 2022-01-28 DIAGNOSIS — M79675 Pain in left toe(s): Secondary | ICD-10-CM

## 2022-01-28 DIAGNOSIS — M2042 Other hammer toe(s) (acquired), left foot: Secondary | ICD-10-CM

## 2022-01-28 DIAGNOSIS — Q828 Other specified congenital malformations of skin: Secondary | ICD-10-CM

## 2022-01-28 DIAGNOSIS — E1142 Type 2 diabetes mellitus with diabetic polyneuropathy: Secondary | ICD-10-CM

## 2022-01-28 DIAGNOSIS — M79674 Pain in right toe(s): Secondary | ICD-10-CM | POA: Diagnosis not present

## 2022-01-28 DIAGNOSIS — B351 Tinea unguium: Secondary | ICD-10-CM

## 2022-01-28 NOTE — Progress Notes (Signed)
SITUATION ?Reason for Consult: Evaluation for Prefabricated Diabetic Shoes and Custom Diabetic Inserts. ?Patient / Caregiver Report: Patient would like well fitting shoes ? ?OBJECTIVE DATA: ?Patient History / Diagnosis:  ?  ICD-10-CM   ?1. Type 2 diabetes mellitus with hyperglycemia, with long-term current use of insulin (HCC)  E11.65   ? Z79.4   ?  ?2. Acquired hammertoes of both feet  M20.41   ? M20.42   ?  ?3. Acquired hallux varus of left foot  M20.32   ?  ?4. Callus  L84   ?  ? ? ?Current or Previous Devices:   Historical ? ?In-Person Foot Examination: ?Ulcers & Callousing:   Historical ? ?Deformities:   ?- Hallux valgus  ?- Hammer toe(s)  ? ?Shoe Size: 11W ? ?ORTHOTIC RECOMMENDATION ?Recommended Devices: ?- 1x pair prefabricated PDAC approved diabetic shoes; Patient Selected - Madelyn Flavors 844 Size 11W ?- 3x pair custom-to-patient PDAC approved vacuum formed diabetic insoles. ? ?GOALS OF SHOES AND INSOLES ?- Reduce shear and pressure ?- Reduce / Prevent callus formation ?- Reduce / Prevent ulceration ?- Protect the fragile healing compromised diabetic foot. ? ?Patient would benefit from diabetic shoes and inserts as patient has diabetes mellitus and the patient has one or more of the following conditions: ?- History of pre-ulcerative callus ?- Peripheral neuropathy with evidence of callus formation ?- Foot deformity ?- Poor circulation ? ?ACTIONS PERFORMED ?Patient was casted for insoles via crush box and measured for shoes via brannock device. Procedure was explained and patient tolerated procedure well. All questions were answered and concerns addressed. ? ?PLAN ?Patient is to be contacted and scheduled for fitting once CMN is obtained from treaing physician and shoes and insoles have been fabricated and received. ? ?

## 2022-01-29 ENCOUNTER — Telehealth: Payer: Self-pay

## 2022-01-29 NOTE — Telephone Encounter (Signed)
Casts sent to central fabrication - HOLD FOR CMN °

## 2022-02-03 NOTE — Progress Notes (Signed)
Subjective: ?Lindsey Marquez is a 71 y.o. female patient seen today for follow up of  at risk foot care with history of peripheral neuropathy and painful porokeratotic lesion(s) left foot, calluses bilaterally and painful mycotic toenails that limit ambulation. Painful toenails interfere with ambulation. Aggravating factors include wearing enclosed shoe gear. Pain is relieved with periodic professional debridement. Painful porokeratotic lesions are aggravated when weightbearing with and without shoegear. Pain is relieved with periodic professional debridement..  ? ?Patient states blood glucose was 117 mg/dl today.  ? ?New problem(s)/concern(s) today: None   ? ?PCP is Tower, Wynelle Fanny, MD. Last visit was: 12/25/2021. ? ?Allergies  ?Allergen Reactions  ? Codeine Other (See Comments)  ?  "get crazy", "see things that aren't there"  ? ? ?Objective: ?Physical Exam ? ?General: Patient is a pleasant 71 y.o. African American female WD, WN in NAD. AAO x 3.  ? ?Neurovascular Examination: ?Neurovascular status unchanged bilaterally. Capillary refill time to digits immediate b/l. Palpable pedal pulses b/l LE. Pedal hair absent. No pain with calf compression b/l. Lower extremity skin temperature gradient within normal limits. No ischemia or gangrene noted b/l LE. No cyanosis or clubbing noted b/l LE. ? ?Pt has subjective symptoms of neuropathy. Protective sensation intact 5/5 intact bilaterally with 10g monofilament b/l. Vibratory sensation intact b/l. ? ?Dermatological:  ?Pedal skin is warm and supple b/l LE. No open wounds b/l LE. No interdigital macerations noted b/l LE. Toenails 1-5 b/l elongated, discolored, dystrophic, thickened, crumbly with subungual debris and tenderness to dorsal palpation. Hyperkeratotic lesion(s) submet head 1 b/l and submet head 4 right foot.  No erythema, no edema, no drainage, no fluctuance. Porokeratotic lesion(s) left fifth digit. No erythema, no edema, no drainage, no  fluctuance. ? ?Musculoskeletal:  ?Muscle strength 5/5 to all lower extremity muscle groups bilaterally. HAV with bunion deformity noted b/l LE. Hammertoe deformity noted 1-5 b/l. ? ?Assessment: ?1. Pain due to onychomycosis of toenails of both feet   ?2. Porokeratosis   ?3. Callus   ?4. Drug-induced polyneuropathy ([name])   ?5. Diabetic peripheral neuropathy associated with type 2 diabetes mellitus ([name])   ? ? ?Plan: ?Patient was evaluated and treated and all questions answered. ?Consent given for treatment as described below: ?-She is scheduled to see Pedorthist today for diabetic shoes. ?-Toenails 1-5 b/l were debrided in length and girth with sterile nail nippers and dremel without iatrogenic bleeding.  ?-Corn(s) left fifth digit pared utilizing sterile scalpel blade without complication or incident. Total number debrided=1. ?-Painful porokeratotic lesion(s) submet head 1 left foot, submet head 1 right foot, and submet head 4 right foot pared and enucleated with sterile scalpel blade without incident. Total number of lesions debrided=3. ?-Patient/POA to call should there be question/concern in the interim. ? ?Return in about 3 months (around 04/30/2022). ? ?Marzetta Board, DPM ?

## 2022-02-23 ENCOUNTER — Other Ambulatory Visit: Payer: Self-pay | Admitting: Hematology

## 2022-02-23 ENCOUNTER — Other Ambulatory Visit: Payer: Self-pay | Admitting: Nurse Practitioner

## 2022-02-23 DIAGNOSIS — D649 Anemia, unspecified: Secondary | ICD-10-CM

## 2022-02-23 DIAGNOSIS — Z1231 Encounter for screening mammogram for malignant neoplasm of breast: Secondary | ICD-10-CM

## 2022-02-23 NOTE — Progress Notes (Signed)
?[place]   ?[phone] [phone]   ?Clinic Follow up Note  ? ?Patient Care Team: ?Tower, Wynelle Fanny, MD as PCP - General (Family Medicine) ?[name], MD as Consulting Physician (General Surgery) ?[name], MD as Consulting Physician (Hematology) ?Gardenia Phlegm, NP as Nurse Practitioner (Hematology and Oncology) ?02/25/2022 ? ?CHIEF COMPLAINT: Follow up recurrent left breast cancer  ? ?SUMMARY OF ONCOLOGIC HISTORY: ?Oncology History Overview Note  ?Cancer of central portion of left breast ([place]) ?  Staging form: Breast, AJCC 7th Edition ?    Clinical stage from 04/17/2016: Stage IA (T1c, N0, M0) - Signed by [name], MD on 05/07/2016 ?    Pathologic stage from 05/28/2016: Stage IIA (T2, N0, cM0) - Signed by [name], MD on 06/11/2016 ?History of left breast cancer ?  Staging form: Breast, AJCC 7th Edition ?    Clinical: Stage IIIB (T4d, N1, cM0) - Signed by [name], MD on 12/14/2013 ?    Pathologic: Stage IIIB (T4d, N1a, cM0) - Signed by [name], MD on 12/14/2013 ? ? ?  ?History of left breast cancer (Resolved)  ?09/12/2002 Imaging  ? CT scan show no evidence of disease apart from the left axilla ?  ?09/2002 -  Neo-Adjuvant Chemotherapy  ? TAC X4 cycles  ?  ?01/10/2003 Surgery  ? The patient had left lumpectomy and axillary lymph node dissection which show residual breast cancer and a sentinel lymph node was positive ?  ?2004 -  Adjuvant Chemotherapy  ? Cytoxan with 5-FU x4 cycles (methotrexate added at cycle 4). ?  ?2004 -  Radiation Therapy  ? Adjuvant left breast radiation ?  ?12/2002 Receptors her2  ? ER, PR and HER-2 were all negative. ?  ?Cancer of central portion of left breast ([place])  ?04/13/2016 Mammogram  ? Scheduler coarse heterogeneous calcifications spanning an area of 3.7 cm in the lumpectomy site, indeterminate. Ultrasound of the left axilla was negative. ?  ?04/17/2016 Initial Diagnosis  ? Cancer of central portion of left breast [facility]) ?  ?04/17/2016  Initial Biopsy  ? Left breast core needle biopsy showed invasive and in situ ductal carcinoma with calcification, grade 2. ?  ?04/17/2016 Receptors her2  ? ER 100% positive, PR 15% positive, strong staining, Ki-67 10%, HER-2 positive by IHC (3) ?  ?05/05/2016 Imaging  ? Bilateral breast MRI showed a 1.3 x 0.8 x 0.7 cm biopsy-proven ductal carcinoma in the region of the previous lumpectomy. Enlarged lobulated right inferior axillary lymph node with diffuse cortical thickening, biopsy is recommended. ?  ?05/15/2016 Pathology Results  ? right axilary node biopsy was negative for malignant cell  ?  ?05/28/2016 Surgery  ? Simple left mastectomy ?  ?05/28/2016 Pathology Results  ? Left mastectomy showed invasive ductal carcinoma, grade 3, 3.6 cm, high grade DCIS, (+) LVI, surgical margins were negative. Tumor 3.6 cm, no lymph nodes identified. ?  ?06/25/2016 Imaging  ? Staging CT scan of the chest, abdomen and pelvis with contrast and a bone scan showed no evidence of metastasis. Postoperative fluid collection in the left breast, likely a seroma or hematoma. Right axillary adenopathy, which was biopsied previously.  ?  ?07/07/2016 - 07/15/2017 Chemotherapy  ? Adjuvant chemotherapy with docetaxel, carboplatin, Herceptin and pejeta every 3 weeks, for 6 cycles, followed by Herceptin maintenance therapy for total of one year treatment ? ?Ended T[facility]P 11/04/16   ?  ?12/15/2016 -  Anti-estrogen oral therapy  ? Adjuvant letrozole 2.5 mg once daily ?  ?04/29/2017  Mammogram  ?  ?Mammogram 04/29/2017 ?IMPRESSION: ?No mammographic evidence of malignancy. A result letter of this ?screening mammogram will be mailed directly to the patient. ? ?  ?08/26/2017 - 07/2018 Antibody Plan  ? She started Nerlynx 264m on 08/26/17, was held on 09/14/17 due to significant diarrhea and restarted when her diarrhea resolved on 09/21/17. Completed in 07/2018 ?  ?09/22/2017 Procedure  ? Colonoscopy by Dr. NSilverio Decamp ?IMPRESSION: ?- One 3 mm polyp in the transverse colon,  removed with a cold biopsy forceps. Resected and ?retrieved. ?- Non-bleeding internal hemorrhoids. ?  ?09/22/2017 Pathology Results  ? Diagnosis, colonoscopy  ?Surgical [P], transverse, polyp ?- TUBULAR ADENOMA(S). ?- HIGH GRADE DYSPLASIA IS NOT IDENTIFIED. ?  ? ? ?CURRENT THERAPY: Letrozole 2.5 mg daily, started 12/15/2016 ? ?INTERVAL HISTORY: Ms. CPrallereturns for follow up as scheduled. Last seen by Dr. FBurr Medico9/29/22.  She is doing well in general, denies changes in her overall health.  She is tolerating letrozole without significant side effects.  Denies hot flashes, bone/joint pain, new concerns in her right breast or left chest wall.  She continues to have neuropathy mainly at night, takes gabapentin.  She is wondering if she can take something during the day that will make her drowsy. ? ?All other systems were reviewed with the patient and are negative. ? ?MEDICAL HISTORY:  ?Past Medical History:  ?Diagnosis Date  ? Blood transfusion without reported diagnosis   ? Breast cancer ([facility] August 2002  ? Invasive ductal carcinoma Left breast. 2002, 2017  ? Diabetes mellitus without complication ([place]   ? Family history of malignant neoplasm of ovary   ? Family history of pancreatic cancer   ? Hypertension   ? ? ?SURGICAL HISTORY: ?Past Surgical History:  ?Procedure Laterality Date  ? BREAST SURGERY Left 2003  ? COLONOSCOPY    ? FOOT SURGERY  2000  ? INSERTION OF MESH N/A 02/17/2018  ? Procedure: INSERTION OF MESH;  Surgeon: TDonnie Mesa MD;  [place]  Service: General;  Laterality: N/A;  ? MASTECTOMY Left 05/28/2016  ? port a cath insertion  05/28/2016  ? PORT-A-CATH REMOVAL Right 08/19/2017  ? Procedure: REMOVAL PORT-A-CATH;  Surgeon: TDonnie Mesa MD;  [place]  Service: General;  Laterality: Right;  ? PORTACATH PLACEMENT Right 05/28/2016  ? Procedure: INSERTION PORT-A-CATH;  Surgeon: MDonnie Mesa MD;  [place]  Service: General;  Laterality: Right;  ? TOTAL MASTECTOMY  Left 05/28/2016  ? Procedure: LEFT  MASTECTOMY;  Surgeon: MDonnie Mesa MD;  [place]  Service: General;  Laterality: Left;  ? TUBAL LIGATION  22 yrs since  2004.  ? Bilateral.  ? VENTRAL HERNIA REPAIR  02/17/2018  ? open  ? VENTRAL HERNIA REPAIR N/A 02/17/2018  ? Procedure: OPEN VENTRAL HERNIA REPAIR WITH MESH;  Surgeon: TDonnie Mesa MD;  [place]  Service: General;  Laterality: N/A;  ? ? ?I have reviewed the social history and family history with the patient and they are unchanged from previous note. ? ?ALLERGIES:  is allergic to codeine. ? ?MEDICATIONS:  ?Current Outpatient Medications  ?Medication Sig Dispense Refill  ? aspirin 81 MG tablet Take 81 mg by mouth daily as needed (leg pain).    ? atorvastatin (LIPITOR) 20 MG tablet Take 1 tablet (20 mg total) by mouth daily. 90 tablet 3  ? BD PEN NEEDLE NANO 2ND GEN 32G X 4 MM MISC USE 1 PEN NEEDLE 2 (TWO) TIMES DAILY. 200 each 1  ?  Blood Glucose Monitoring Suppl (ONETOUCH VERIO) w/Device KIT Use to check blood sugar 3 times a day. E11.9 1 kit 0  ? dapagliflozin propanediol (FARXIGA) 10 MG TABS tablet Take 1 tablet (10 mg total) by mouth daily. 90 tablet 3  ? Fish Oil-Cholecalciferol (FISH OIL + D3 PO)     ? gabapentin (NEURONTIN) 300 MG capsule Take 2 capsules (600 mg total) by mouth at bedtime. 60 capsule 5  ? Insulin Glargine (BASAGLAR KWIKPEN) 100 UNIT/ML Inject under skin 30 units daily 30 mL 3  ? letrozole (FEMARA) 2.5 MG tablet Take 1 tablet (2.5 mg total) by mouth daily. 90 tablet 3  ? lisinopril-hydrochlorothiazide (ZESTORETIC) 20-25 MG tablet Take 1 tablet by mouth daily. 90 tablet 3  ? metFORMIN (GLUCOPHAGE) 1000 MG tablet Take 1 tablet (1,000 mg total) by mouth 2 (two) times daily with a meal. 180 tablet 3  ? NON FORMULARY Peripheral Neuropathy cream from [facility]    ? Omega-3 Fatty Acids (FISH OIL) 1200 MG CAPS Take 1,200 mg by mouth daily.     ? ONETOUCH VERIO test strip USE TO CHECK BLOOD SUGAR 3 TIMES A DAY. E11.9 300  strip 4  ? scopolamine (TRANSDERM-SCOP) 1 MG/3DAYS Place 1 patch (1.5 mg total) onto the skin every 3 (three) days. 10 patch 12  ? TRUEPLUS LANCETS 33G MISC 1 each by Does not apply route 3 (three) times d

## 2022-02-25 ENCOUNTER — Inpatient Hospital Stay: Payer: No Typology Code available for payment source | Attending: Hematology

## 2022-02-25 ENCOUNTER — Encounter: Payer: Self-pay | Admitting: Nurse Practitioner

## 2022-02-25 ENCOUNTER — Other Ambulatory Visit: Payer: Self-pay

## 2022-02-25 ENCOUNTER — Inpatient Hospital Stay (HOSPITAL_BASED_OUTPATIENT_CLINIC_OR_DEPARTMENT_OTHER): Payer: No Typology Code available for payment source | Admitting: Nurse Practitioner

## 2022-02-25 VITALS — BP 159/71 | HR 100 | Temp 97.9°F | Resp 18 | Ht 68.0 in | Wt 219.0 lb

## 2022-02-25 DIAGNOSIS — Z794 Long term (current) use of insulin: Secondary | ICD-10-CM | POA: Diagnosis not present

## 2022-02-25 DIAGNOSIS — Z1231 Encounter for screening mammogram for malignant neoplasm of breast: Secondary | ICD-10-CM | POA: Diagnosis not present

## 2022-02-25 DIAGNOSIS — E1140 Type 2 diabetes mellitus with diabetic neuropathy, unspecified: Secondary | ICD-10-CM | POA: Diagnosis not present

## 2022-02-25 DIAGNOSIS — Z79899 Other long term (current) drug therapy: Secondary | ICD-10-CM | POA: Diagnosis not present

## 2022-02-25 DIAGNOSIS — Z853 Personal history of malignant neoplasm of breast: Secondary | ICD-10-CM | POA: Insufficient documentation

## 2022-02-25 DIAGNOSIS — D649 Anemia, unspecified: Secondary | ICD-10-CM | POA: Diagnosis not present

## 2022-02-25 DIAGNOSIS — I10 Essential (primary) hypertension: Secondary | ICD-10-CM | POA: Diagnosis not present

## 2022-02-25 DIAGNOSIS — C50112 Malignant neoplasm of central portion of left female breast: Secondary | ICD-10-CM | POA: Diagnosis not present

## 2022-02-25 DIAGNOSIS — Z170 Estrogen receptor positive status [ER+]: Secondary | ICD-10-CM | POA: Insufficient documentation

## 2022-02-25 DIAGNOSIS — Z79811 Long term (current) use of aromatase inhibitors: Secondary | ICD-10-CM | POA: Diagnosis not present

## 2022-02-25 DIAGNOSIS — Z7984 Long term (current) use of oral hypoglycemic drugs: Secondary | ICD-10-CM | POA: Insufficient documentation

## 2022-02-25 LAB — CBC WITH DIFFERENTIAL (CANCER CENTER ONLY)
Abs Immature Granulocytes: 0.02 10*3/uL (ref 0.00–0.07)
Basophils Absolute: 0 10*3/uL (ref 0.0–0.1)
Basophils Relative: 0 %
Eosinophils Absolute: 0.1 10*3/uL (ref 0.0–0.5)
Eosinophils Relative: 2 %
HCT: 36.7 % (ref 36.0–46.0)
Hemoglobin: 11.8 g/dL — ABNORMAL LOW (ref 12.0–15.0)
Immature Granulocytes: 0 %
Lymphocytes Relative: 27 %
Lymphs Abs: 2.4 10*3/uL (ref 0.7–4.0)
MCH: 32.5 pg (ref 26.0–34.0)
MCHC: 32.2 g/dL (ref 30.0–36.0)
MCV: 101.1 fL — ABNORMAL HIGH (ref 80.0–100.0)
Monocytes Absolute: 0.5 10*3/uL (ref 0.1–1.0)
Monocytes Relative: 5 %
Neutro Abs: 5.7 10*3/uL (ref 1.7–7.7)
Neutrophils Relative %: 66 %
Platelet Count: 339 10*3/uL (ref 150–400)
RBC: 3.63 MIL/uL — ABNORMAL LOW (ref 3.87–5.11)
RDW: 12.2 % (ref 11.5–15.5)
WBC Count: 8.8 10*3/uL (ref 4.0–10.5)
nRBC: 0 % (ref 0.0–0.2)

## 2022-02-25 LAB — COMPREHENSIVE METABOLIC PANEL
ALT: 22 U/L (ref 0–44)
AST: 22 U/L (ref 15–41)
Albumin: 4.3 g/dL (ref 3.5–5.0)
Alkaline Phosphatase: 68 U/L (ref 38–126)
Anion gap: 9 (ref 5–15)
BUN: 30 mg/dL — ABNORMAL HIGH (ref 8–23)
CO2: 24 mmol/L (ref 22–32)
Calcium: 9.9 mg/dL (ref 8.9–10.3)
Chloride: 105 mmol/L (ref 98–111)
Creatinine, Ser: 1.07 mg/dL — ABNORMAL HIGH (ref 0.44–1.00)
GFR, Estimated: 56 mL/min — ABNORMAL LOW (ref >60)
Glucose, Bld: 113 mg/dL — ABNORMAL HIGH (ref 70–99)
Potassium: 4.5 mmol/L (ref 3.5–5.1)
Sodium: 138 mmol/L (ref 135–145)
Total Bilirubin: 0.5 mg/dL (ref 0.3–1.2)
Total Protein: 8.1 g/dL (ref 6.5–8.1)

## 2022-02-25 LAB — IRON AND IRON BINDING CAPACITY (CC-WL,HP ONLY)
Iron: 131 ug/dL (ref 28–170)
Saturation Ratios: 30 % (ref 10.4–31.8)
TIBC: 441 ug/dL (ref 250–450)
UIBC: 310 ug/dL (ref 148–442)

## 2022-02-25 LAB — FERRITIN: Ferritin: 76 ng/mL (ref 11–307)

## 2022-03-09 ENCOUNTER — Other Ambulatory Visit: Payer: Self-pay | Admitting: Family

## 2022-03-19 ENCOUNTER — Telehealth: Payer: Self-pay

## 2022-03-19 NOTE — Telephone Encounter (Signed)
CMN Received, Shoes Ordered and foot orthotics released to fabrication. [facility] [address], [facility] ?

## 2022-04-07 ENCOUNTER — Telehealth: Payer: Self-pay | Admitting: Family Medicine

## 2022-04-07 NOTE — Telephone Encounter (Signed)
Pt did have CPE and she has regular insurance not Medicare so no AWV needed  ? ?Also I didn't call pt not sure who did ?

## 2022-04-07 NOTE — Telephone Encounter (Signed)
Pt called and stated she received a call today around lunch about needing a wellness visit, she stated she already had a cpe on 12/25/2021. Wants to speak with the nurse. ? ?Callback Number: [phone] ?

## 2022-04-16 ENCOUNTER — Ambulatory Visit (INDEPENDENT_AMBULATORY_CARE_PROVIDER_SITE_OTHER): Payer: No Typology Code available for payment source

## 2022-04-16 DIAGNOSIS — Z0000 Encounter for general adult medical examination without abnormal findings: Secondary | ICD-10-CM | POA: Diagnosis not present

## 2022-04-16 NOTE — Progress Notes (Signed)
Subjective:   Lindsey Marquez is a 71 y.o. female who presents for Medicare Annual (Subsequent) preventive examination.  Virtual Visit via Telephone Note  I connected with  Lindsey Marquez on 04/16/22 at  1:30 PM EDT by telephone and verified that I am speaking with the correct person using two identifiers.  Location: Patient: home Provider: [place] Marquez Persons participating in the virtual visit: [place]   I discussed the limitations, risks, security and privacy concerns of performing an evaluation and management service by telephone and the availability of in person appointments. The patient expressed understanding and agreed to proceed.  Interactive audio and video telecommunications were attempted between this nurse and patient, however failed, due to patient having technical difficulties OR patient did not have access to video capability.  We continued and completed visit with audio only.  Some vital signs may be absent or patient reported.   Clemetine Marker, LPN   Review of Systems     Cardiac Risk Factors include: advanced age (>64mn, >>64women);diabetes mellitus;dyslipidemia;hypertension     Objective:    There were no vitals filed for this visit. There is no height or weight on file to calculate BMI.     04/16/2022    1:41 PM 02/17/2018    1:34 PM 02/10/2018   10:07 AM 02/02/2018   11:17 AM 10/11/2017    9:58 AM 08/19/2017    6:34 AM 08/12/2017   12:58 PM  Advanced Directives  Does Patient Have a Medical Advance Directive? No No No No No No No  Would patient like information on creating a medical advance directive? Yes (MAU/Ambulatory/Procedural Areas - Information given) No - Patient declined   No - Patient declined No - Patient declined     Current Medications (verified) Outpatient Encounter Medications as of 04/16/2022  Medication Sig   aspirin 81 MG tablet Take 81 mg by mouth daily as needed (leg pain).   atorvastatin (LIPITOR) 20 MG tablet Take 1  tablet (20 mg total) by mouth daily.   BD PEN NEEDLE NANO 2ND GEN 32G X 4 MM MISC USE 1  TWICE DAILY   Blood Glucose Monitoring Suppl (ONETOUCH VERIO) w/Device KIT Use to check blood sugar 3 times a day. E11.9   dapagliflozin propanediol (FARXIGA) 10 MG TABS tablet Take 1 tablet (10 mg total) by mouth daily.   gabapentin (NEURONTIN) 300 MG capsule Take 2 capsules (600 mg total) by mouth at bedtime.   Insulin Glargine (BASAGLAR KWIKPEN) 100 UNIT/ML Inject under skin 30 units daily   letrozole (FEMARA) 2.5 MG tablet Take 1 tablet (2.5 mg total) by mouth daily.   lisinopril-hydrochlorothiazide (ZESTORETIC) 20-25 MG tablet Take 1 tablet by mouth daily.   metFORMIN (GLUCOPHAGE) 1000 MG tablet Take 1 tablet (1,000 mg total) by mouth 2 (two) times daily with a meal.   Omega-3 Fatty Acids (FISH OIL) 1200 MG CAPS Take 1,200 mg by mouth daily.    ONETOUCH VERIO test strip USE TO CHECK BLOOD SUGAR 3 TIMES A DAY. E11.9   TRUEPLUS LANCETS 33G MISC 1 each by Does not apply route 3 (three) times daily.   NON FORMULARY Peripheral Neuropathy cream from [facility](Patient not taking: Reported on 04/16/2022)   [DISCONTINUED] Fish Oil-Cholecalciferol (FISH OIL + D3 PO)    [DISCONTINUED] scopolamine (TRANSDERM-SCOP) 1 MG/3DAYS Place 1 patch (1.5 mg total) onto the skin every 3 (three) days.   No facility-administered encounter medications on file as of 04/16/2022.    Allergies (verified) Codeine  History: Past Medical History:  Diagnosis Date   Blood transfusion without reported diagnosis    Breast cancer [facility]) August 2002   Invasive ductal carcinoma Left breast. 2002, 2017   Diabetes mellitus without complication ([place])    Family history of malignant neoplasm of ovary    Family history of pancreatic cancer    Hypertension    Past Surgical History:  Procedure Laterality Date   BREAST SURGERY Left 2003   COLONOSCOPY     FOOT SURGERY  2000   INSERTION OF MESH N/A 02/17/2018   Procedure:  INSERTION OF MESH;  Surgeon: [name], MD;  Location: [place];  Service: General;  Laterality: N/A;   MASTECTOMY Left 05/28/2016   port a cath insertion  05/28/2016   PORT-A-CATH REMOVAL Right 08/19/2017   Procedure: REMOVAL PORT-A-CATH;  Surgeon: [name], MD;  Location: [place];  Service: General;  Laterality: Right;   PORTACATH PLACEMENT Right 05/28/2016   Procedure: INSERTION PORT-A-CATH;  Surgeon: [name], MD;  Location: [place];  Service: General;  Laterality: Right;   TOTAL MASTECTOMY Left 05/28/2016   Procedure: LEFT  MASTECTOMY;  Surgeon: [name], MD;  Location: [place];  Service: General;  Laterality: Left;   TUBAL LIGATION  22 yrs since  2004.   Bilateral.   VENTRAL HERNIA REPAIR  02/17/2018   open   VENTRAL HERNIA REPAIR N/A 02/17/2018   Procedure: OPEN VENTRAL HERNIA REPAIR WITH MESH;  Surgeon: [name], MD;  Location: [place];  Service: General;  Laterality: N/A;   Family History  Problem Relation Age of Onset   Prostate cancer Father    Ovarian cancer Maternal Aunt 35       deceased   Pancreatic cancer Mother 30       deceased   Cancer Maternal Aunt        2 other mat aunts; unk. primary in 38s; deceased   Cancer Maternal Uncle        "bone ca"; unk. primary; deceased 23s   Prostate cancer Maternal Uncle        deceased 32   Colon cancer Neg Hx    Esophageal cancer Neg Hx    Rectal cancer Neg Hx    Stomach cancer Neg Hx    Social History   Socioeconomic History   Marital status: Divorced    Spouse name: Not on file   Number of children: Not on file   Years of education: Not on file   Highest education level: Not on file  Occupational History   Not on file  Tobacco Use   Smoking status: Former    Packs/day: 0.10    Years: 7.00    Pack years: 0.70    Types: Cigarettes    Quit date: 12/01/1975    Years since quitting: 46.4   Smokeless tobacco: Never  Vaping Use   Vaping Use: Never used  Substance and Sexual Activity    Alcohol use: No    Alcohol/week: 0.0 standard drinks   Drug use: No   Sexual activity: Yes  Other Topics Concern   Not on file  Social History Narrative   Not on file   Social Determinants of Health   Financial Resource Strain: Low Risk    Difficulty of Paying Living Expenses: Not hard at all  Food Insecurity: No Food Insecurity   Worried About Charity fundraiser in the Last Year: Never true   [place] in the Last Year: Never  true  Transportation Needs: No Transportation Needs   Lack of Transportation (Medical): No   Lack of Transportation (Non-Medical): No  Physical Activity: Insufficiently Active   Days of Exercise per Week: 4 days   Minutes of Exercise per Session: 30 min  Stress: No Stress Concern Present   Feeling of Stress : Not at all  Social Connections: Moderately Isolated   Frequency of Communication with Friends and Family: More than three times a week   Frequency of Social Gatherings with Friends and Family: More than three times a week   Attends Religious Services: More than 4 times per year   Active Member of [employer] or Organizations: No   Attends Music therapist: Never   Marital Status: Divorced    Tobacco Counseling Counseling given: Not Answered   Clinical Intake:  Pre-visit preparation completed: Yes  Pain : No/denies pain     Nutritional Risks: None Diabetes: Yes CBG done?: No Did pt. bring in CBG monitor from home?: No  How often do you need to have someone help you when you read instructions, pamphlets, or other written materials from your doctor or pharmacy?: 1 - Never  Nutrition Risk Assessment:  Has the patient had any N/V/D within the last 2 months?  No  Does the patient have any non-healing wounds?  No  Has the patient had any unintentional weight loss or weight gain?  No   Diabetes:  Is the patient diabetic?  Yes  If diabetic, was a CBG obtained today?  No  Did the patient bring in their glucometer from  home?  No  How often do you monitor your CBG's? 2-3 times per day.   Financial Strains and Diabetes Management:  Are you having any financial strains with the device, your supplies or your medication? No .  Does the patient want to be seen by Chronic Care Management for management of their diabetes?  No  Would the patient like to be referred to a Nutritionist or for Diabetic Management?  No   Diabetic Exams:  Diabetic Eye Exam: Completed 05/28/21.   Diabetic Foot Exam: Completed 10/17/21.   Interpreter Needed?: No  Information entered by :: Clemetine Marker LPN   Activities of Daily Living    04/16/2022    1:44 PM  In your present state of health, do you have any difficulty performing the following activities:  Hearing? 0  Vision? 0  Difficulty concentrating or making decisions? 0  Walking or climbing stairs? 0  Dressing or bathing? 0  Doing errands, shopping? 0  Preparing Food and eating ? N  Using the Toilet? N  In the past six months, have you accidently leaked urine? N  Do you have problems with loss of bowel control? N  Managing your Medications? N  Managing your Finances? N  Housekeeping or managing your Housekeeping? N    Patient Care Team: Tower, Wynelle Fanny, MD as PCP - General (Family Medicine) Marzetta Board, DPM as Consulting Physician (Podiatry) Philemon Kingdom, MD as Consulting Physician (Endocrinology) Alla Feeling, NP as Nurse Practitioner (Hematology and Oncology)  Indicate any recent Medical Services you may have received from other than [facility] providers in the past year (date may be approximate).     Assessment:   This is a routine wellness examination for Sharlyn.  Hearing/Vision screen Hearing Screening - Comments:: Pt denies hearing difficulty Vision Screening - Comments:: Annual vision screenings done by [facility]  Dietary issues and exercise activities discussed: Current Exercise  Habits: Home exercise routine, Type of  exercise: treadmill;Other - see comments (exercise bike), Time (Minutes): 30, Frequency (Times/Week): 4, Weekly Exercise (Minutes/Week): 120, Intensity: Mild, Exercise limited by: None identified   Goals Addressed   None    Depression Screen    04/16/2022    1:39 PM 12/25/2021   12:09 PM 04/15/2020   11:00 AM 03/12/2020   11:40 AM 12/20/2018   12:01 PM 01/04/2018    1:24 PM 10/04/2017   11:23 AM  PHQ 2/9 Scores  PHQ - 2 Score 0 0 0 0 0 0 0  PHQ- 9 Score     1      Fall Risk    04/16/2022    1:43 PM 07/17/2021    9:09 AM 04/15/2020   11:00 AM 12/20/2018   12:01 PM 01/04/2018    1:24 PM  [place] in the past year? 0 1 0 0 No  Number falls in past yr: 0 0     Injury with Fall? 0 1     Risk for fall due to : No Fall Risks      Follow up Falls prevention discussed        [place]:  Any stairs in or around the home? Yes  If so, are there any without handrails? No  Home free of loose throw rugs in walkways, pet beds, electrical cords, etc? Yes  Adequate lighting in your home to reduce risk of falls? Yes   ASSISTIVE DEVICES UTILIZED TO PREVENT FALLS:  Life alert? No  Use of a cane, walker or w/c? No  Grab bars in the bathroom? No  Shower chair or bench in shower? No  Elevated toilet seat or a handicapped toilet? No   TIMED UP AND GO:  Was the test performed? No . Telephonic visit.   Cognitive Function: Normal cognitive status assessed by direct observation by this Nurse Health Advisor. No abnormalities found.          Immunizations Immunization History  Administered Date(s) Administered   Fluad Quad(high Dose 65+) 08/03/2019, 08/29/2020   Influenza, High Dose Seasonal PF 09/14/2017, 09/03/2021   Influenza,inj,Quad PF,6+ Mos 09/07/2016, 09/29/2018   Influenza-Unspecified 10/31/2015   PFIZER(Purple Top)SARS-COV-2 Vaccination 01/25/2020, 02/20/2020, 10/09/2020   Pneumococcal Conjugate-13 10/04/2017   Pneumococcal  Polysaccharide-23 01/27/2016   Td 12/26/2021   Tdap 09/08/2012   Zoster Recombinat (Shingrix) 04/24/2020   Zoster, Live 09/08/2012    TDAP status: Up to date  Flu Vaccine status: Up to date  Pneumococcal vaccine status: Up to date  Covid-19 vaccine status: Completed vaccines  Qualifies for Shingles Vaccine? Yes   Zostavax completed Yes   Shingrix Completed?: Yes  Screening Tests Health Maintenance  Topic Date Due   Zoster Vaccines- Shingrix (2 of 2) 06/19/2020   COVID-19 Vaccine (4 - Booster for Pfizer series) 12/04/2020   Pneumonia Vaccine 56+ Years old (3 - PPSV23 if available, else PCV20) 01/26/2021   HEMOGLOBIN A1C  03/18/2022   OPHTHALMOLOGY EXAM  05/28/2022   INFLUENZA VACCINE  06/30/2022   FOOT EXAM  10/17/2022   MAMMOGRAM  07/24/2023   COLONOSCOPY (Pts 45-27yr Insurance coverage will need to be confirmed)  09/23/2027   TETANUS/TDAP  12/27/2031   DEXA SCAN  Completed   Hepatitis C Screening  Completed   HPV VACCINES  Aged Out    Health Maintenance  Health Maintenance Due  Topic Date Due   Zoster Vaccines- Shingrix (2 of 2) 06/19/2020  COVID-19 Vaccine (4 - Booster for Pfizer series) 12/04/2020   Pneumonia Vaccine 19+ Years old (3 - PPSV23 if available, else PCV20) 01/26/2021   HEMOGLOBIN A1C  03/18/2022    Colorectal cancer screening: Type of screening: Colonoscopy. Completed 09/22/17. Repeat every 10 years  Mammogram status: Completed 07/23/21. Repeat every year. Scheduled for 08/19/22  Bone Density status: Completed 06/30/19. Results reflect: Bone density results: NORMAL. Repeat every 3-5 years. Scheduled 08/19/22  Lung Cancer Screening: (Low Dose CT Chest recommended if Age 59-80 years, 30 pack-year currently smoking OR have quit w/in 15years.) does not qualify.  Additional Screening:  Hepatitis C Screening: does qualify; Completed 10/04/17  Vision Screening: Recommended annual ophthalmology exams for early detection of glaucoma and other disorders of  the eye. Is the patient up to date with their annual eye exam?  Yes  Who is the provider or what is the name of the office in which the patient attends annual eye exams? [facility].   Dental Screening: Recommended annual dental exams for proper oral hygiene  Community Resource Referral / Chronic Care Management: CRR required this visit?  No   CCM required this visit?  No      Plan:     I have personally reviewed and noted the following in the patient's chart:   Medical and social history Use of alcohol, tobacco or illicit drugs  Current medications and supplements including opioid prescriptions.  Functional ability and status Nutritional status Physical activity Advanced directives List of other physicians Hospitalizations, surgeries, and ER visits in previous 12 months Vitals Screenings to include cognitive, depression, and falls Referrals and appointments  In addition, I have reviewed and discussed with patient certain preventive protocols, quality metrics, and best practice recommendations. A written personalized care plan for preventive services as well as general preventive health recommendations were provided to patient.     Clemetine Marker, LPN   [id number]   Nurse Notes: pt c/o ongoing neuropathy pain; advised to discuss with PCP or oncologist.

## 2022-04-16 NOTE — Patient Instructions (Signed)
Lindsey Marquez , Thank you for taking time to come for your Medicare Wellness Visit. I appreciate your ongoing commitment to your health goals. Please review the following plan we discussed and let me know if I can assist you in the future.   Screening recommendations/referrals: Colonoscopy: done 09/22/17. Repeat 08/2027 Mammogram: done 07/23/21. Scheduled 08/19/22 Bone Density: done 06/30/19. Scheduled 08/19/22 Recommended yearly ophthalmology/optometry visit for glaucoma screening and checkup Recommended yearly dental visit for hygiene and checkup  Vaccinations: Influenza vaccine: done 09/03/21 Pneumococcal vaccine: done 10/04/17 Tdap vaccine: done 12/26/21 Shingles vaccine: done 04/24/20; please bring vaccine record for second dose information   Covid-19:done 01/25/20, 02/20/20, 10/09/20  Advanced directives: Advance directive discussed with you today. You may obtain a copy from our office for you to complete at home and have notarized. Once this is complete please bring a copy in to our office so we can scan it into your chart.   Conditions/risks identified: Keep up the great work!  Next appointment: Follow up in one year for your annual wellness visit    Preventive Care 65 Years and Older, Female Preventive care refers to lifestyle choices and visits with your health care provider that can promote health and wellness. What does preventive care include? A yearly physical exam. This is also called an annual well check. Dental exams once or twice a year. Routine eye exams. Ask your health care provider how often you should have your eyes checked. Personal lifestyle choices, including: Daily care of your teeth and gums. Regular physical activity. Eating a healthy diet. Avoiding tobacco and drug use. Limiting alcohol use. Practicing safe sex. Taking low-dose aspirin every day. Taking vitamin and mineral supplements as recommended by your health care provider. What happens during an annual well  check? The services and screenings done by your health care provider during your annual well check will depend on your age, overall health, lifestyle risk factors, and family history of disease. Counseling  Your health care provider may ask you questions about your: Alcohol use. Tobacco use. Drug use. Emotional well-being. Home and relationship well-being. Sexual activity. Eating habits. History of falls. Memory and ability to understand (cognition). Work and work Statistician. Reproductive health. Screening  You may have the following tests or measurements: Height, weight, and BMI. Blood pressure. Lipid and cholesterol levels. These may be checked every 5 years, or more frequently if you are over 53 years old. Skin check. Lung cancer screening. You may have this screening every year starting at age 68 if you have a 30-pack-year history of smoking and currently smoke or have quit within the past 15 years. Fecal occult blood test (FOBT) of the stool. You may have this test every year starting at age 93. Flexible sigmoidoscopy or colonoscopy. You may have a sigmoidoscopy every 5 years or a colonoscopy every 10 years starting at age 74. Hepatitis C blood test. Hepatitis B blood test. Sexually transmitted disease (STD) testing. Diabetes screening. This is done by checking your blood sugar (glucose) after you have not eaten for a while (fasting). You may have this done every 1-3 years. Bone density scan. This is done to screen for osteoporosis. You may have this done starting at age 49. Mammogram. This may be done every 1-2 years. Talk to your health care provider about how often you should have regular mammograms. Talk with your health care provider about your test results, treatment options, and if necessary, the need for more tests. Vaccines  Your health care provider may recommend certain  vaccines, such as: Influenza vaccine. This is recommended every year. Tetanus, diphtheria, and  acellular pertussis (Tdap, Td) vaccine. You may need a Td booster every 10 years. Zoster vaccine. You may need this after age 13. Pneumococcal 13-valent conjugate (PCV13) vaccine. One dose is recommended after age 1. Pneumococcal polysaccharide (PPSV23) vaccine. One dose is recommended after age 6. Talk to your health care provider about which screenings and vaccines you need and how often you need them. This information is not intended to replace advice given to you by your health care provider. Make sure you discuss any questions you have with your health care provider. Document Released: 12/13/2015 Document Revised: 08/05/2016 Document Reviewed: 09/17/2015 Elsevier Interactive Patient Education  2017 [place] Prevention in the Home Falls can cause injuries. They can happen to people of all ages. There are many things you can do to make your home safe and to help prevent falls. What can I do on the outside of my home? Regularly fix the edges of walkways and driveways and fix any cracks. Remove anything that might make you trip as you walk through a door, such as a raised step or threshold. Trim any bushes or trees on the path to your home. Use bright outdoor lighting. Clear any walking paths of anything that might make someone trip, such as rocks or tools. Regularly check to see if handrails are loose or broken. Make sure that both sides of any steps have handrails. Any raised decks and porches should have guardrails on the edges. Have any leaves, snow, or ice cleared regularly. Use sand or salt on walking paths during winter. Clean up any spills in your garage right away. This includes oil or grease spills. What can I do in the bathroom? Use night lights. Install grab bars by the toilet and in the tub and shower. Do not use towel bars as grab bars. Use non-skid mats or decals in the tub or shower. If you need to sit down in the shower, use a plastic, non-slip stool. Keep  the floor dry. Clean up any water that spills on the floor as soon as it happens. Remove soap buildup in the tub or shower regularly. Attach bath mats securely with double-sided non-slip rug tape. Do not have throw rugs and other things on the floor that can make you trip. What can I do in the bedroom? Use night lights. Make sure that you have a light by your bed that is easy to reach. Do not use any sheets or blankets that are too big for your bed. They should not hang down onto the floor. Have a firm chair that has side arms. You can use this for support while you get dressed. Do not have throw rugs and other things on the floor that can make you trip. What can I do in the kitchen? Clean up any spills right away. Avoid walking on wet floors. Keep items that you use a lot in easy-to-reach places. If you need to reach something above you, use a strong step stool that has a grab bar. Keep electrical cords out of the way. Do not use floor polish or wax that makes floors slippery. If you must use wax, use non-skid floor wax. Do not have throw rugs and other things on the floor that can make you trip. What can I do with my stairs? Do not leave any items on the stairs. Make sure that there are handrails on both sides of the stairs  and use them. Fix handrails that are broken or loose. Make sure that handrails are as long as the stairways. Check any carpeting to make sure that it is firmly attached to the stairs. Fix any carpet that is loose or worn. Avoid having throw rugs at the top or bottom of the stairs. If you do have throw rugs, attach them to the floor with carpet tape. Make sure that you have a light switch at the top of the stairs and the bottom of the stairs. If you do not have them, ask someone to add them for you. What else can I do to help prevent falls? Wear shoes that: Do not have high heels. Have rubber bottoms. Are comfortable and fit you well. Are closed at the toe. Do not  wear sandals. If you use a stepladder: Make sure that it is fully opened. Do not climb a closed stepladder. Make sure that both sides of the stepladder are locked into place. Ask someone to hold it for you, if possible. Clearly mark and make sure that you can see: Any grab bars or handrails. First and last steps. Where the edge of each step is. Use tools that help you move around (mobility aids) if they are needed. These include: Canes. Walkers. Scooters. Crutches. Turn on the lights when you go into a dark area. Replace any light bulbs as soon as they burn out. Set up your furniture so you have a clear path. Avoid moving your furniture around. If any of your floors are uneven, fix them. If there are any pets around you, be aware of where they are. Review your medicines with your doctor. Some medicines can make you feel dizzy. This can increase your chance of falling. Ask your doctor what other things that you can do to help prevent falls. This information is not intended to replace advice given to you by your health care provider. Make sure you discuss any questions you have with your health care provider. Document Released: 09/12/2009 Document Revised: 04/23/2016 Document Reviewed: 12/21/2014 Elsevier Interactive Patient Education  2017 [employer].

## 2022-04-21 ENCOUNTER — Telehealth: Payer: Self-pay

## 2022-04-21 NOTE — Telephone Encounter (Signed)
Diabetic shoes and insoles ready - LVM for patient to call and schedule pickup

## 2022-04-22 ENCOUNTER — Ambulatory Visit (INDEPENDENT_AMBULATORY_CARE_PROVIDER_SITE_OTHER): Payer: No Typology Code available for payment source

## 2022-04-22 DIAGNOSIS — Z794 Long term (current) use of insulin: Secondary | ICD-10-CM

## 2022-04-22 DIAGNOSIS — M2042 Other hammer toe(s) (acquired), left foot: Secondary | ICD-10-CM

## 2022-04-22 DIAGNOSIS — M2041 Other hammer toe(s) (acquired), right foot: Secondary | ICD-10-CM

## 2022-04-22 DIAGNOSIS — E1165 Type 2 diabetes mellitus with hyperglycemia: Secondary | ICD-10-CM | POA: Diagnosis not present

## 2022-04-22 NOTE — Progress Notes (Signed)
SITUATION Reason for Visit: Fitting of Diabetic Shoes & Insoles Patient / Caregiver Report:  Patient is satisfied with fit and function of shoes and insoles.  OBJECTIVE DATA: Patient History / Diagnosis:     ICD-10-CM   1. Type 2 diabetes mellitus with hyperglycemia, with long-term current use of insulin (HCC)  E11.65    Z79.4     2. Acquired hammertoes of both feet  M20.41    M20.42       Change in Status:   None  ACTIONS PERFORMED: In-Person Delivery, patient was fit with: - 1x pair A5500 PDAC approved prefabricated Diabetic Shoes: [employer] [place] - 3x pair [phone] PDAC approved vacuum formed custom diabetic insoles; [employer]: [id number]  Shoes and insoles were verified for structural integrity and safety. Patient wore shoes and insoles in office. Skin was inspected and free of areas of concern after wearing shoes and inserts. Shoes and inserts fit properly. Patient / Caregiver provided with ferbal instruction and demonstration regarding donning, doffing, wear, care, proper fit, function, purpose, cleaning, and use of shoes and insoles ' and in all related precautions and risks and benefits regarding shoes and insoles. Patient / Caregiver was instructed to wear properly fitting socks with shoes at all times. Patient was also provided with verbal instruction regarding how to report any failures or malfunctions of shoes or inserts, and necessary follow up care. Patient / Caregiver was also instructed to contact physician regarding change in status that may affect function of shoes and inserts.   Patient / Caregiver verbalized undersatnding of instruction provided. Patient / Caregiver demonstrated independence with proper donning and doffing of shoes and inserts.  PLAN Patient to follow with treating physician as recommended. Plan of care was discussed with and agreed upon by patient and/or caregiver. All questions were answered and concerns addressed.  ADDENDUM  -- Patient  decided after wearing them she felt the shoe was too tight and would like to return them for an x-wide. Shoes returned and 11XW ordered.

## 2022-04-30 ENCOUNTER — Other Ambulatory Visit: Payer: Self-pay | Admitting: Hematology

## 2022-04-30 DIAGNOSIS — C50112 Malignant neoplasm of central portion of left female breast: Secondary | ICD-10-CM

## 2022-05-06 ENCOUNTER — Ambulatory Visit (INDEPENDENT_AMBULATORY_CARE_PROVIDER_SITE_OTHER): Payer: No Typology Code available for payment source | Admitting: Podiatry

## 2022-05-06 ENCOUNTER — Encounter: Payer: Self-pay | Admitting: Podiatry

## 2022-05-06 DIAGNOSIS — L84 Corns and callosities: Secondary | ICD-10-CM

## 2022-05-06 DIAGNOSIS — Z794 Long term (current) use of insulin: Secondary | ICD-10-CM | POA: Diagnosis not present

## 2022-05-06 DIAGNOSIS — B351 Tinea unguium: Secondary | ICD-10-CM | POA: Diagnosis not present

## 2022-05-06 DIAGNOSIS — E1165 Type 2 diabetes mellitus with hyperglycemia: Secondary | ICD-10-CM

## 2022-05-06 DIAGNOSIS — G620 Drug-induced polyneuropathy: Secondary | ICD-10-CM

## 2022-05-06 DIAGNOSIS — L601 Onycholysis: Secondary | ICD-10-CM | POA: Diagnosis not present

## 2022-05-06 DIAGNOSIS — M79675 Pain in left toe(s): Secondary | ICD-10-CM | POA: Diagnosis not present

## 2022-05-06 DIAGNOSIS — M79674 Pain in right toe(s): Secondary | ICD-10-CM

## 2022-05-06 DIAGNOSIS — Q828 Other specified congenital malformations of skin: Secondary | ICD-10-CM | POA: Diagnosis not present

## 2022-05-10 NOTE — Progress Notes (Signed)
  Subjective:  Patient ID: Lindsey Marquez, female    DOB: 03-Feb-1951,  MRN: [id number]  Lindsey Marquez presents to clinic today for at risk foot care with h/o diabetes and neuropathy secondary to chemotherapy and painful elongated mycotic toenails 1-5 bilaterally which are tender when wearing enclosed shoe gear. Pain is relieved with periodic professional debridement.  Patient states blood glucose was 128 mg/dl today.  Last known HgA1c was around 6%.  Patient did not check blood glucose today.  New problem(s): None.   PCP is Lindsey Marquez, Lindsey Fanny, MD , and last visit was December 25, 2021.  Allergies  Allergen Reactions   Codeine Other (See Comments)    "get crazy", "see things that aren't there"    Review of Systems: Negative except as noted in the HPI.  Objective:General: Patient is a pleasant 71 y.o. African American female WD, WN in NAD. AAO x 3.   Neurovascular Examination: Neurovascular status unchanged bilaterally. Capillary refill time to digits immediate b/l. Palpable pedal pulses b/l LE. Pedal hair absent. No pain with calf compression b/l. Lower extremity skin temperature gradient within normal limits. No ischemia or gangrene noted b/l LE. No cyanosis or clubbing noted b/l LE.  Pt has subjective symptoms of neuropathy. Protective sensation intact 5/5 intact bilaterally with 10g monofilament b/l. Vibratory sensation intact b/l.  Dermatological:  Pedal skin is warm and supple b/l LE. No open wounds b/l LE. No interdigital macerations noted b/l LE. Toenails bilateral great toes, left 2nd toe and toes 3-5 b/l elongated, discolored, dystrophic, thickened, crumbly with subungual debris and tenderness to dorsal palpation.   There is noted onchyolysis of entire nailplate of the left 2nd toe.  The nailbed remains intact. There is no erythema, no edema, no drainage, no underlying fluctuance.   Hyperkeratotic lesion(s) submet head 1 b/l and submet head 4 right foot.  No erythema, no  edema, no drainage, no fluctuance. Porokeratotic lesion(s) left fifth digit. No erythema, no edema, no drainage, no fluctuance.  Musculoskeletal:  Muscle strength 5/5 to all lower extremity muscle groups bilaterally. HAV with bunion deformity noted b/l LE. Hammertoe deformity noted 1-5 b/    Latest Ref Rng & Units 09/17/2021   11:36 AM 06/12/2021   11:05 AM  Hemoglobin A1C  Hemoglobin-A1c 4.0 - 5.6 % 6.6  6.9    Assessment/Plan: 1. Pain due to onychomycosis of toenails of both feet   2. Onycholysis of toenail   3. Porokeratosis   4. Corns   5. Drug-induced polyneuropathy ([place])   6. Type 2 diabetes mellitus with hyperglycemia, with long-term current use of insulin (HCC)     -Examined patient. -Loose nailplate left 2nd toe debrided en toto. Nailbed remains intact. No further treatment required by patient. -Mycotic toenails bilateral great toes, 3-5 bilaterally, and R 2nd toe were debrided in length and girth with sterile nail nippers and dremel without iatrogenic bleeding. -Corn(s) L 5th toe pared utilizing sterile scalpel blade without complication or incident. Total number debrided=1. -Porokeratotic lesion(s) submet head 1 b/l and submet head 4 right foot pared and enucleated with sterile scalpel blade without incident. Total number of lesions debrided=3. -Patient/POA to call should there be question/concern in the interim.   Return in about 3 months (around 08/06/2022).  Marzetta Board, DPM

## 2022-05-25 ENCOUNTER — Other Ambulatory Visit: Payer: No Typology Code available for payment source

## 2022-05-26 ENCOUNTER — Ambulatory Visit: Payer: No Typology Code available for payment source

## 2022-05-26 DIAGNOSIS — E1165 Type 2 diabetes mellitus with hyperglycemia: Secondary | ICD-10-CM

## 2022-05-26 NOTE — Progress Notes (Signed)
Patient seen in error, incorrect shoe was provided by manufacturer. Patient to be called when shoe is in

## 2022-06-01 ENCOUNTER — Other Ambulatory Visit: Payer: Self-pay | Admitting: Nurse Practitioner

## 2022-06-01 DIAGNOSIS — C50112 Malignant neoplasm of central portion of left female breast: Secondary | ICD-10-CM

## 2022-06-03 ENCOUNTER — Ambulatory Visit: Payer: BC Managed Care – PPO | Admitting: Internal Medicine

## 2022-06-10 ENCOUNTER — Telehealth: Payer: Self-pay | Admitting: Podiatry

## 2022-06-10 LAB — HM DIABETES EYE EXAM

## 2022-06-10 NOTE — Telephone Encounter (Signed)
Pt is calling to see if diabetic shoes are in. States that she was told it would take about 2 weeks to come back.  Please give her a callback

## 2022-06-12 ENCOUNTER — Other Ambulatory Visit: Payer: Self-pay | Admitting: Nurse Practitioner

## 2022-06-12 DIAGNOSIS — C50112 Malignant neoplasm of central portion of left female breast: Secondary | ICD-10-CM

## 2022-06-27 ENCOUNTER — Other Ambulatory Visit: Payer: Self-pay | Admitting: Nurse Practitioner

## 2022-06-27 DIAGNOSIS — C50112 Malignant neoplasm of central portion of left female breast: Secondary | ICD-10-CM

## 2022-07-01 ENCOUNTER — Other Ambulatory Visit: Payer: No Typology Code available for payment source

## 2022-07-02 ENCOUNTER — Ambulatory Visit (INDEPENDENT_AMBULATORY_CARE_PROVIDER_SITE_OTHER): Payer: No Typology Code available for payment source

## 2022-07-02 DIAGNOSIS — L84 Corns and callosities: Secondary | ICD-10-CM

## 2022-07-02 DIAGNOSIS — E1142 Type 2 diabetes mellitus with diabetic polyneuropathy: Secondary | ICD-10-CM | POA: Diagnosis not present

## 2022-07-02 NOTE — Progress Notes (Signed)
Patient presents today to pick up diabetic shoes and insoles.  Patient was dispensed 1 pair of diabetic shoes and 3 pairs of foam casted diabetic insoles. Fit was satisfactory. Instructions for break-in and wear was reviewed and a copy was given to the patient.   Re-appointment for regularly scheduled diabetic foot care visits or if they should experience any trouble with the shoes or insoles.  

## 2022-07-06 ENCOUNTER — Other Ambulatory Visit: Payer: Self-pay | Admitting: Nurse Practitioner

## 2022-07-06 DIAGNOSIS — C50112 Malignant neoplasm of central portion of left female breast: Secondary | ICD-10-CM

## 2022-07-09 ENCOUNTER — Other Ambulatory Visit: Payer: Self-pay | Admitting: Internal Medicine

## 2022-07-13 ENCOUNTER — Other Ambulatory Visit: Payer: No Typology Code available for payment source

## 2022-07-23 ENCOUNTER — Other Ambulatory Visit: Payer: Self-pay | Admitting: Nurse Practitioner

## 2022-07-23 DIAGNOSIS — C50112 Malignant neoplasm of central portion of left female breast: Secondary | ICD-10-CM

## 2022-07-30 ENCOUNTER — Other Ambulatory Visit: Payer: Self-pay | Admitting: Hematology

## 2022-07-30 DIAGNOSIS — C50112 Malignant neoplasm of central portion of left female breast: Secondary | ICD-10-CM

## 2022-08-04 NOTE — Progress Notes (Signed)
[place]   [phone] [phone]   Clinic Follow up Note   Patient Care Team: Tower, Wynelle Fanny, MD as PCP - General (Family Medicine) Marzetta Board, DPM as Consulting Physician (Podiatry) Philemon Kingdom, MD as Consulting Physician (Endocrinology) Alla Feeling, NP as Nurse Practitioner (Hematology and Oncology) 08/06/2022  CHIEF COMPLAINT: Follow-up recurrent left breast cancer  SUMMARY OF ONCOLOGIC HISTORY: Oncology History Overview Note  Cancer of central portion of left breast [facility])   Staging form: Breast, AJCC 7th Edition     Clinical stage from 04/17/2016: Stage IA (T1c, N0, M0) - Signed by [name], MD on 05/07/2016     Pathologic stage from 05/28/2016: Stage IIA (T2, N0, cM0) - Signed by [name], MD on 06/11/2016 History of left breast cancer   Staging form: Breast, AJCC 7th Edition     Clinical: Stage IIIB (T4d, N1, cM0) - Signed by [name], MD on 12/14/2013     Pathologic: Stage IIIB (T4d, N1a, cM0) - Signed by [name], MD on 12/14/2013     History of left breast cancer (Resolved)  09/12/2002 Imaging   CT scan show no evidence of disease apart from the left axilla   09/2002 -  Neo-Adjuvant Chemotherapy   TAC X4 cycles    01/10/2003 Surgery   The patient had left lumpectomy and axillary lymph node dissection which show residual breast cancer and a sentinel lymph node was positive   2004 -  Adjuvant Chemotherapy   Cytoxan with 5-FU x4 cycles (methotrexate added at cycle 4).   2004 -  Radiation Therapy   Adjuvant left breast radiation   12/2002 Receptors her2   ER, PR and HER-2 were all negative.   Cancer of central portion of left breast ([place])  04/13/2016 Mammogram   Scheduler coarse heterogeneous calcifications spanning an area of 3.7 cm in the lumpectomy site, indeterminate. Ultrasound of the left axilla was negative.   04/17/2016 Initial Diagnosis   Cancer of central portion of left breast ([place])   04/17/2016  Initial Biopsy   Left breast core needle biopsy showed invasive and in situ ductal carcinoma with calcification, grade 2.   04/17/2016 Receptors her2   ER 100% positive, PR 15% positive, strong staining, Ki-67 10%, HER-2 positive by IHC (3)   05/05/2016 Imaging   Bilateral breast MRI showed a 1.3 x 0.8 x 0.7 cm biopsy-proven ductal carcinoma in the region of the previous lumpectomy. Enlarged lobulated right inferior axillary lymph node with diffuse cortical thickening, biopsy is recommended.   05/15/2016 Pathology Results   right axilary node biopsy was negative for malignant cell    05/28/2016 Surgery   Simple left mastectomy   05/28/2016 Pathology Results   Left mastectomy showed invasive ductal carcinoma, grade 3, 3.6 cm, high grade DCIS, (+) LVI, surgical margins were negative. Tumor 3.6 cm, no lymph nodes identified.   06/25/2016 Imaging   Staging CT scan of the chest, abdomen and pelvis with contrast and a bone scan showed no evidence of metastasis. Postoperative fluid collection in the left breast, likely a seroma or hematoma. Right axillary adenopathy, which was biopsied previously.    07/07/2016 - 07/15/2017 Chemotherapy   Adjuvant chemotherapy with docetaxel, carboplatin, Herceptin and pejeta every 3 weeks, for 6 cycles, followed by Herceptin maintenance therapy for total of one year treatment  Ended [facility] 11/04/16     12/15/2016 -  Anti-estrogen oral therapy   Adjuvant letrozole 2.5 mg once daily   04/29/2017 Mammogram  Mammogram 04/29/2017 IMPRESSION: No mammographic evidence of malignancy. A result letter of this screening mammogram will be mailed directly to the patient.    08/26/2017 - 07/2018 Antibody Plan   She started Nerlynx $RemoveBeforeD'240mg'cNCABIBBllrcBk$  on 08/26/17, was held on 09/14/17 due to significant diarrhea and restarted when her diarrhea resolved on 09/21/17. Completed in 07/2018   09/22/2017 Procedure   Colonoscopy by Dr. Silverio Decamp  IMPRESSION: - One 3 mm polyp in the transverse colon,  removed with a cold biopsy forceps. Resected and retrieved. - Non-bleeding internal hemorrhoids.   09/22/2017 Pathology Results   Diagnosis, colonoscopy  Surgical [P], transverse, polyp - TUBULAR ADENOMA(S). - HIGH GRADE DYSPLASIA IS NOT IDENTIFIED.     CURRENT THERAPY: Letrozole 2.5 mg daily, started 12/15/2016  INTERVAL HISTORY: Lindsey Marquez returns for follow-up as scheduled, last seen by me 02/25/2022.  Mammogram/DEXA are scheduled 08/19/2022.  She continues letrozole but almost out, tolerating well with mild joint pain in her legs and occasional hot flashes.  She noticed mild left ankle edema recently, no calf pain.  She has mild residual neuropathy mainly in her feet, takes gabapentin 2 daily.  She is questioning topical neuropathy cream which is on her med list but she is not sure what that is.  Denies specific complaints in her right breast/left chest wall such as new lump/mass, nipple discharge or inversion, or skin change  All other systems were reviewed with the patient and are negative.  MEDICAL HISTORY:  Past Medical History:  Diagnosis Date   Blood transfusion without reported diagnosis    Breast cancer [facility]) August 2002   Invasive ductal carcinoma Left breast. 2002, 2017   Diabetes mellitus without complication ([place])    Family history of malignant neoplasm of ovary    Family history of pancreatic cancer    Hypertension     SURGICAL HISTORY: Past Surgical History:  Procedure Laterality Date   BREAST SURGERY Left 2003   COLONOSCOPY     FOOT SURGERY  2000   INSERTION OF MESH N/A 02/17/2018   Procedure: INSERTION OF MESH;  Surgeon: [name], MD;  Location: [place];  Service: General;  Laterality: N/A;   MASTECTOMY Left 05/28/2016   port a cath insertion  05/28/2016   PORT-A-CATH REMOVAL Right 08/19/2017   Procedure: REMOVAL PORT-A-CATH;  Surgeon: [name], MD;  Location: [place];  Service: General;  Laterality: Right;   PORTACATH PLACEMENT Right  05/28/2016   Procedure: INSERTION PORT-A-CATH;  Surgeon: [name], MD;  Location: [place];  Service: General;  Laterality: Right;   TOTAL MASTECTOMY Left 05/28/2016   Procedure: LEFT  MASTECTOMY;  Surgeon: [name], MD;  Location: [place];  Service: General;  Laterality: Left;   TUBAL LIGATION  22 yrs since  2004.   Bilateral.   VENTRAL HERNIA REPAIR  02/17/2018   open   VENTRAL HERNIA REPAIR N/A 02/17/2018   Procedure: OPEN VENTRAL HERNIA REPAIR WITH MESH;  Surgeon: [name], MD;  Location: [place];  Service: General;  Laterality: N/A;    I have reviewed the social history and family history with the patient and they are unchanged from previous note.  ALLERGIES:  is allergic to codeine.  MEDICATIONS:  Current Outpatient Medications  Medication Sig Dispense Refill   aspirin 81 MG tablet Take 81 mg by mouth daily as needed (leg pain).     atorvastatin (LIPITOR) 20 MG tablet Take 1 tablet (20 mg total) by mouth daily. 90 tablet 3   BD PEN NEEDLE NANO  2ND GEN 32G X 4 MM MISC USE 1  TWICE DAILY 200 each 0   Blood Glucose Monitoring Suppl (ONETOUCH VERIO) w/Device KIT Use to check blood sugar 3 times a day. E11.9 1 kit 0   dapagliflozin propanediol (FARXIGA) 10 MG TABS tablet Take 1 tablet (10 mg total) by mouth daily. 90 tablet 3   gabapentin (NEURONTIN) 300 MG capsule Take 2 capsules (600 mg total) by mouth at bedtime. 180 capsule 1   glucose blood (ONETOUCH VERIO) test strip USE 1 STRIP TO CHECK GLUCOSE THREE TIMES DAILY 100 each 3   Insulin Glargine (BASAGLAR KWIKPEN) 100 UNIT/ML Inject under skin 30 units daily 30 mL 3   letrozole (FEMARA) 2.5 MG tablet Take 1 tablet (2.5 mg total) by mouth daily. 90 tablet 3   lisinopril-hydrochlorothiazide (ZESTORETIC) 20-25 MG tablet Take 1 tablet by mouth daily. 90 tablet 3   metFORMIN (GLUCOPHAGE) 1000 MG tablet Take 1 tablet (1,000 mg total) by mouth 2 (two) times daily with a meal. 180 tablet 3   NON FORMULARY Peripheral Neuropathy cream  from [place] (Patient not taking: Reported on 04/16/2022)     Omega-3 Fatty Acids (FISH OIL) 1200 MG CAPS Take 1,200 mg by mouth daily.      TRUEPLUS LANCETS 33G MISC 1 each by Does not apply route 3 (three) times daily. 600 each 2   No current facility-administered medications for this visit.    PHYSICAL EXAMINATION: ECOG PERFORMANCE STATUS: 1 - Symptomatic but completely ambulatory  Vitals:   08/06/22 1042  BP: (!) 175/72  Pulse: (!) 106  Resp: 15  Temp: 98.7 F (37.1 C)  SpO2: 99%   Filed Weights   08/06/22 1042  Weight: 223 lb 11.2 oz (101.5 kg)    GENERAL:alert, no distress and comfortable SKIN: No rash. EYES: sclera clear NECK: Without mass LYMPH:  no palpable cervical or supraclavicular lymphadenopathy LUNGS: normal breathing effort HEART: regular rate & rhythm, trace bilateral ankle edema and mild discoloration over the distal lower legs ABDOMEN:abdomen soft, non-tender and normal bowel sounds NEURO: alert & oriented x 3 with fluent speech, no focal motor/sensory deficits Breast exam: No right nipple discharge inversion or discharge.  S/p left mastectomy, incision completely healed with scar tissue.  No palpable mass or nodularity along the scar, left chest wall, right breast, or either axilla that I could appreciate.  LABORATORY DATA:  I have reviewed the data as listed    Latest Ref Rng & Units 08/06/2022   10:18 AM 02/25/2022   12:10 PM 12/25/2021   12:26 PM  CBC  WBC 4.0 - 10.5 K/uL 8.4  8.8  8.4   Hemoglobin 12.0 - 15.0 g/dL 11.0  11.8  11.5   Hematocrit 36.0 - 46.0 % 33.1  36.7  35.4   Platelets 150 - 400 K/uL 323  339  391.0         Latest Ref Rng & Units 08/06/2022   10:18 AM 02/25/2022   12:10 PM 12/25/2021   12:26 PM  CMP  Glucose 70 - 99 mg/dL 144  113  98   BUN 8 - 23 mg/dL 35  30  23   Creatinine 0.44 - 1.00 mg/dL 1.08  1.07  0.95   Sodium 135 - 145 mmol/L 138  138  138   Potassium 3.5 - 5.1 mmol/L 4.5  4.5  4.9   Chloride 98 - 111  mmol/L 108  105  104   CO2 22 - 32 mmol/L 22  24  27   Calcium 8.9 - 10.3 mg/dL 9.7  9.9  10.1   Total Protein 6.5 - 8.1 g/dL 7.8  8.1  8.0   Total Bilirubin 0.3 - 1.2 mg/dL 0.4  0.5  0.4   Alkaline Phos 38 - 126 U/L 63  68  60   AST 15 - 41 U/L _0 ALT 0 - 44 U/L _1 RADIOGRAPHIC STUDIES: I have personally reviewed the radiological images as listed and agreed with the findings in the report. No results found.   ASSESSMENT & PLAN: Lindsey Marquez is a 71 y.o. female with    1. 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 She was treated with Neo-adjuvant chemo, left breast lumpectomy, adjuvant chemo and Radiation.  -She had another left breast cancer in 03/2016. She was treated with left mastectomy, adjuvant chemo TCHP and Nerlynx.  -She started letrozole in 11/2016 for a goal of 10 years. Tolerating well with mild lower extremity joint pain and occasional hot flashes.   -Lindsey Marquez is clinically doing well.  Breast exam is benign, labs are stable. No clinical concern for recurrence.  -Next right mammo and DEXA scheduled 08/19/2022, we will call with results -She is over 6 years from second breast cancer, the recurrence risk has decreased.  We will change her to annual visits -Follow-up in 12 months, or sooner if needed   2. Anemia  -Her B12, iron, and reticulocyte count were normal in 2022, this is likely anemia of chronic disease -mild and stable, continue monitoring   3. DM and HTN  -Follow-up with her primary care physician   4. 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 She wears compression socks.  -she started gabapentin at night, takes 600 mg which is helpful -Will look into topical cream for neuropathy, on her med list from a previous prescription   5. Obesity  -Some weight gain  recently she attributes to lack of exercise getting her kids ready for back to school -Encouraged her to continue healthy active lifestyle and age-appropriate health maintenance.  She is due for screening colonoscopy, I will CC Dr. Silverio Decamp   6. Bone Health -We previously discussed that Letrozole can contribute to decreased bone density -Her DEXA is 04/29/2017 and 05/20/2019 were normal -He takes calcium -Repeat 08/19/2022 with mammogram as scheduled   Plan: -Labs reviewed -Continue breast cancer surveillance and letrozole, refilled -Right mammogram and DEXA 08/19/2022 as scheduled, we will call with results -F/up on topical neuropathy cream, gabapentin refilled -Cc Dr. Silverio Decamp for screening colonoscopy due this year -f/up in 1 year, or sooner if needed   All questions were answered. The patient knows to call the clinic with any problems, questions or concerns. No barriers to learning were detected.      Alla Feeling, NP 08/06/22

## 2022-08-05 ENCOUNTER — Other Ambulatory Visit: Payer: Self-pay

## 2022-08-05 DIAGNOSIS — Z1231 Encounter for screening mammogram for malignant neoplasm of breast: Secondary | ICD-10-CM

## 2022-08-05 DIAGNOSIS — C50112 Malignant neoplasm of central portion of left female breast: Secondary | ICD-10-CM

## 2022-08-06 ENCOUNTER — Inpatient Hospital Stay: Payer: No Typology Code available for payment source

## 2022-08-06 ENCOUNTER — Inpatient Hospital Stay: Payer: No Typology Code available for payment source | Attending: Nurse Practitioner | Admitting: Nurse Practitioner

## 2022-08-06 ENCOUNTER — Other Ambulatory Visit: Payer: Self-pay

## 2022-08-06 ENCOUNTER — Telehealth: Payer: Self-pay

## 2022-08-06 ENCOUNTER — Encounter: Payer: Self-pay | Admitting: Nurse Practitioner

## 2022-08-06 VITALS — BP 175/72 | HR 106 | Temp 98.7°F | Resp 15 | Ht 68.0 in | Wt 223.7 lb

## 2022-08-06 DIAGNOSIS — E119 Type 2 diabetes mellitus without complications: Secondary | ICD-10-CM | POA: Insufficient documentation

## 2022-08-06 DIAGNOSIS — Z9221 Personal history of antineoplastic chemotherapy: Secondary | ICD-10-CM | POA: Insufficient documentation

## 2022-08-06 DIAGNOSIS — G629 Polyneuropathy, unspecified: Secondary | ICD-10-CM | POA: Insufficient documentation

## 2022-08-06 DIAGNOSIS — C50112 Malignant neoplasm of central portion of left female breast: Secondary | ICD-10-CM | POA: Diagnosis not present

## 2022-08-06 DIAGNOSIS — Z170 Estrogen receptor positive status [ER+]: Secondary | ICD-10-CM | POA: Insufficient documentation

## 2022-08-06 DIAGNOSIS — Z008 Encounter for other general examination: Secondary | ICD-10-CM | POA: Diagnosis not present

## 2022-08-06 DIAGNOSIS — I10 Essential (primary) hypertension: Secondary | ICD-10-CM | POA: Diagnosis not present

## 2022-08-06 DIAGNOSIS — Z79811 Long term (current) use of aromatase inhibitors: Secondary | ICD-10-CM | POA: Diagnosis not present

## 2022-08-06 DIAGNOSIS — D649 Anemia, unspecified: Secondary | ICD-10-CM | POA: Diagnosis not present

## 2022-08-06 DIAGNOSIS — Z9012 Acquired absence of left breast and nipple: Secondary | ICD-10-CM | POA: Diagnosis not present

## 2022-08-06 DIAGNOSIS — Z79899 Other long term (current) drug therapy: Secondary | ICD-10-CM | POA: Diagnosis not present

## 2022-08-06 DIAGNOSIS — Z923 Personal history of irradiation: Secondary | ICD-10-CM | POA: Insufficient documentation

## 2022-08-06 LAB — CBC WITH DIFFERENTIAL/PLATELET
Abs Immature Granulocytes: 0.03 10*3/uL (ref 0.00–0.07)
Basophils Absolute: 0 10*3/uL (ref 0.0–0.1)
Basophils Relative: 1 %
Eosinophils Absolute: 0.3 10*3/uL (ref 0.0–0.5)
Eosinophils Relative: 4 %
HCT: 33.1 % — ABNORMAL LOW (ref 36.0–46.0)
Hemoglobin: 11 g/dL — ABNORMAL LOW (ref 12.0–15.0)
Immature Granulocytes: 0 %
Lymphocytes Relative: 30 %
Lymphs Abs: 2.5 10*3/uL (ref 0.7–4.0)
MCH: 33 pg (ref 26.0–34.0)
MCHC: 33.2 g/dL (ref 30.0–36.0)
MCV: 99.4 fL (ref 80.0–100.0)
Monocytes Absolute: 0.5 10*3/uL (ref 0.1–1.0)
Monocytes Relative: 6 %
Neutro Abs: 5 10*3/uL (ref 1.7–7.7)
Neutrophils Relative %: 59 %
Platelets: 323 10*3/uL (ref 150–400)
RBC: 3.33 MIL/uL — ABNORMAL LOW (ref 3.87–5.11)
RDW: 12.5 % (ref 11.5–15.5)
WBC: 8.4 10*3/uL (ref 4.0–10.5)
nRBC: 0 % (ref 0.0–0.2)

## 2022-08-06 LAB — COMPREHENSIVE METABOLIC PANEL
ALT: 15 U/L (ref 0–44)
AST: 18 U/L (ref 15–41)
Albumin: 4.3 g/dL (ref 3.5–5.0)
Alkaline Phosphatase: 63 U/L (ref 38–126)
Anion gap: 8 (ref 5–15)
BUN: 35 mg/dL — ABNORMAL HIGH (ref 8–23)
CO2: 22 mmol/L (ref 22–32)
Calcium: 9.7 mg/dL (ref 8.9–10.3)
Chloride: 108 mmol/L (ref 98–111)
Creatinine, Ser: 1.08 mg/dL — ABNORMAL HIGH (ref 0.44–1.00)
GFR, Estimated: 55 mL/min — ABNORMAL LOW (ref >60)
Glucose, Bld: 144 mg/dL — ABNORMAL HIGH (ref 70–99)
Potassium: 4.5 mmol/L (ref 3.5–5.1)
Sodium: 138 mmol/L (ref 135–145)
Total Bilirubin: 0.4 mg/dL (ref 0.3–1.2)
Total Protein: 7.8 g/dL (ref 6.5–8.1)

## 2022-08-06 MED ORDER — LETROZOLE 2.5 MG PO TABS: 2.5000 mg | ORAL_TABLET | Freq: Every day | ORAL | 3 refills | Status: DC

## 2022-08-06 MED ORDER — GABAPENTIN 300 MG PO CAPS: 600.0000 mg | ORAL_CAPSULE | Freq: Every day | ORAL | 1 refills | Status: DC

## 2022-08-06 NOTE — Telephone Encounter (Signed)
LVM for pt stating that Lindsey Rue, NP has reached out to Dr. Mickeal Skinner to see if there is a topical cream for peripheral neuropathy.  Unfortunately, there is not a topical cream available for peripheral neuropathy.  Informed pt that Daisy stated she can try Aspercreme but highly recommends if the pt continues to her gabapentin for her neuropathy.  Instructed pt to contact [place] office should she have additional questions or concerns.

## 2022-08-14 ENCOUNTER — Ambulatory Visit: Payer: BC Managed Care – PPO | Admitting: Podiatry

## 2022-08-18 ENCOUNTER — Other Ambulatory Visit: Payer: Self-pay

## 2022-08-18 NOTE — Progress Notes (Signed)
Epic faxed orders for pt's DEXA and mammogram to the breast center at [phone] per their request.  Epic fax confirmation received.

## 2022-08-19 ENCOUNTER — Ambulatory Visit
Admission: RE | Admit: 2022-08-19 | Discharge: 2022-08-19 | Disposition: A | Discharge status: Home / Self Care | Payer: No Typology Code available for payment source | Source: Ambulatory Visit | Attending: Nurse Practitioner | Admitting: Nurse Practitioner

## 2022-08-19 ENCOUNTER — Other Ambulatory Visit: Payer: Self-pay

## 2022-08-19 DIAGNOSIS — Z780 Asymptomatic menopausal state: Secondary | ICD-10-CM | POA: Diagnosis not present

## 2022-08-19 DIAGNOSIS — Z1231 Encounter for screening mammogram for malignant neoplasm of breast: Secondary | ICD-10-CM

## 2022-08-19 DIAGNOSIS — C50112 Malignant neoplasm of central portion of left female breast: Secondary | ICD-10-CM

## 2022-08-19 NOTE — Progress Notes (Signed)
Received call again this morning from [place] at the [facility] claiming they do not have signed orders.  Confirmed Fax number again with [place] [phone]) which she stated is correct.  Faxed orders again for the 2nd time.  Fax confirmation was received by both Epic and traditional fax.

## 2022-08-20 ENCOUNTER — Other Ambulatory Visit: Payer: Self-pay

## 2022-08-20 ENCOUNTER — Other Ambulatory Visit: Payer: Self-pay | Admitting: Nurse Practitioner

## 2022-08-20 ENCOUNTER — Encounter: Payer: Self-pay | Admitting: Gastroenterology

## 2022-08-20 ENCOUNTER — Telehealth: Payer: Self-pay

## 2022-08-20 NOTE — Telephone Encounter (Signed)
Pt LVM stating that Lindsey Rue, NP was supposed to get her scheduled for a colonoscopy but she has not heard from anyone regarding that appointment.  Reviewed Lindsey Marquez's last office note and based on that note, Lindsey stated she would send Lindsey Marquez ([facility]) her last office note to review.  Spoke with pt to make her aware that Lindsey just sent Lindsey Marquez her office note.  Instructed pt to get in contact with Lindsey Marquez office to see if she could get herself scheduled for her annual colonoscopy.  Pt verbalized understanding and had no further questions or concerns at this time.

## 2022-08-24 ENCOUNTER — Encounter: Payer: Self-pay | Admitting: Podiatry

## 2022-08-24 ENCOUNTER — Ambulatory Visit (INDEPENDENT_AMBULATORY_CARE_PROVIDER_SITE_OTHER): Payer: No Typology Code available for payment source | Admitting: Podiatry

## 2022-08-24 ENCOUNTER — Telehealth: Payer: Self-pay | Admitting: Gastroenterology

## 2022-08-24 DIAGNOSIS — M79675 Pain in left toe(s): Secondary | ICD-10-CM | POA: Diagnosis not present

## 2022-08-24 DIAGNOSIS — M79674 Pain in right toe(s): Secondary | ICD-10-CM | POA: Diagnosis not present

## 2022-08-24 DIAGNOSIS — B351 Tinea unguium: Secondary | ICD-10-CM

## 2022-08-24 DIAGNOSIS — L84 Corns and callosities: Secondary | ICD-10-CM

## 2022-08-24 DIAGNOSIS — E1142 Type 2 diabetes mellitus with diabetic polyneuropathy: Secondary | ICD-10-CM | POA: Diagnosis not present

## 2022-08-24 NOTE — Progress Notes (Signed)
  Subjective:  Patient ID: Lindsey Marquez, female    DOB: 1951/10/06,  MRN: [id number]  Lindsey Marquez presents to clinic today for  Chief Complaint  Patient presents with   Nail Problem    Diabetic foot care BS-111 A1C-6.? PCP-Tower PCP Vst- couple months ago   New problem(s): None.   PCP is Tower, Wynelle Fanny, MD , and last visit was  December 25, 2021.  Allergies  Allergen Reactions   Codeine Other (See Comments)    "get crazy", "see things that aren't there"    Review of Systems: Negative except as noted in the HPI.  Objective: No changes noted in today's physical examination.  PAULETTA PICKNEY is a pleasant 71 y.o. female in NAD. AAO x 3.  Neurovascular Examination: Neurovascular status unchanged bilaterally. Capillary refill time to digits immediate b/l. Palpable pedal pulses b/l LE. Pedal hair absent. No pain with calf compression b/l. Lower extremity skin temperature gradient within normal limits. No ischemia or gangrene noted b/l LE. No cyanosis or clubbing noted b/l LE.  Pt has subjective symptoms of neuropathy. Protective sensation intact 5/5 intact bilaterally with 10g monofilament b/l. Vibratory sensation intact b/l.  Dermatological:  Pedal skin is warm and supple b/l LE. No open wounds b/l LE. No interdigital macerations noted b/l LE. Toenails bilateral great toes, left 2nd toe and toes 3-5 b/l elongated, discolored, dystrophic, thickened, crumbly with subungual debris and tenderness to dorsal palpation.   There is noted onchyolysis of entire nailplate of the left 2nd toe.  The nailbed remains intact. There is no erythema, no edema, no drainage, no underlying fluctuance.   Hyperkeratotic lesion(s) submet head 1 b/l and submet head 4 right foot.  No erythema, no edema, no drainage, no fluctuance. Porokeratotic lesion(s) left fifth digit. No erythema, no edema, no drainage, no fluctuance.  Musculoskeletal:  Muscle strength 5/5 to all lower extremity muscle groups  bilaterally. HAV with bunion deformity noted b/l LE. Hammertoe deformity noted 1-5 b/   Assessment/Plan: 1. Pain due to onychomycosis of toenails of both feet   2. Callus   3. Diabetic peripheral neuropathy associated with type 2 diabetes mellitus ([place])      -Consent given for treatment as described below: -Examined patient. -Continue foot and shoe inspections daily. Monitor blood glucose per PCP/Endocrinologist's recommendations. -Toenails 1-5 b/l were debrided in length and girth with sterile nail nippers and dremel without iatrogenic bleeding.  -Callus(es) submet head 1 b/l and submet head 4 right foot pared utilizing sterile scalpel blade without complication or incident. Total number debrided =3. -Patient/POA to call should there be question/concern in the interim.   Return in about 3 months (around 11/23/2022).  Marzetta Board, DPM

## 2022-09-09 ENCOUNTER — Ambulatory Visit: Payer: No Typology Code available for payment source | Admitting: Internal Medicine

## 2022-09-09 ENCOUNTER — Ambulatory Visit: Payer: BC Managed Care – PPO | Admitting: Internal Medicine

## 2022-09-09 NOTE — Addendum Note (Signed)
Addended by: Lauralyn Primes on: 09/09/2022 09:58 AM   Modules accepted: Orders

## 2022-09-09 NOTE — Progress Notes (Deleted)
Patient ID: Lindsey Marquez, female   DOB: 25-May-1951, 71 y.o.   MRN: [id number]   HPI: Lindsey Marquez is a 71 y.o.-year-old female, returning for follow-up for DM2, dx in ~2001, insulin-dependent, uncontrolled, without long-term complications. Last visit 8 months ago.  Interim history: No increased urination, blurry vision, nausea, chest pain. 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  On letrozole.  Reviewed HbA1c levels: 01/19/2022: HbA1c 6.2% Lab Results  Component Value Date   HGBA1C 6.6 (A) 09/17/2021   HGBA1C 6.9 (A) 06/12/2021   HGBA1C 9.1 (A) 03/04/2021   HGBA1C 7.9 (A) 10/28/2020   HGBA1C 8.4 (H) 04/15/2020   HGBA1C 10.4 (A) 10/04/2019   HGBA1C 9.8 (H) 12/20/2018   HGBA1C 9.4 (A) 09/20/2018   HGBA1C 8.8 (H) 04/05/2018   HGBA1C 7.5 (H) 02/10/2018   HGBA1C 7.4 (H) 01/04/2018   HGBA1C 8.0 (H) 10/04/2017   HGBA1C 6.9 (H) 12/28/2016   HGBA1C 8.0 (H) 09/24/2016   HGBA1C 7.8 (H) 05/26/2016   HGBA1C 9.1 01/27/2016   Pt is on: - Basaglar 20 >> 30 >> 34-36 >> 20 >> 30 units at bedtime - Metformin 1000 mg 2x a day with meals  - Farxiga 10 mg daily before b'fast  GLP1 R agonists were not affordable.  Pt checks her sugars once a day: - am 95-126, 139 >> 93-117, 125, 134 >> 96-137, 160 (candy) - 2h after b'fast: n/c - before lunch:125-142 >> n/c >> 93-98, 122 >> 90s-101 - 2h after lunch: n/c >> 344 >> n/c >> 80-174 >> n/c - before dinner: 180-195 >> 200s >> 103-145, 174 >> n/c - 2h after dinner: 112-191, 216 >> 124-156 >> n/c >> 100-127 - bedtime:  115-137, 182 >> 92-124, 146 >> n/c - nighttime: n/c Lowest sugar was 103 >> 116 >> 80 >> 96; she has hypoglycemia awareness in the 70s. Highest sugar was 216 >> 167 >> 182 >> 146.  Glucometer: True metrix (we cannot download this)  Pt's meals are: - Breakfast:coffee, mc muffin, egg - Lunch: [place] sandwich - Dinner: baked chicken, veggies,dessert - apples, - Snacks: yoghurt, pretzels, PB  -No CKD, last  BUN/creatinine:  Lab Results  Component Value Date   BUN 35 (H) 08/06/2022   BUN 30 (H) 02/25/2022   CREATININE 1.08 (H) 08/06/2022   CREATININE 1.07 (H) 02/25/2022  On lisinopril 10.  -+ HL; last set of lipids: Lab Results  Component Value Date   CHOL 189 12/25/2021   HDL 51.60 12/25/2021   LDLCALC 108 (H) 12/25/2021   LDLDIRECT 113.0 04/15/2020   TRIG 143.0 12/25/2021   CHOLHDL 4 12/25/2021  On Lipitor 10 mg, fish oil.  - last eye exam was 04/2021: No DR reportedly.  - no numbness and tingling in her feet.  Last foot exam was per Dr. Elisha Ponder 01/28/2022. On ASA 81.  Pt has FH of DM in brother, sister, father.  + h/o BrCA in 2013, recurrence in 2018.  She is cancer free.    ROS: + See HPI  Current Outpatient Medications on File Prior to Visit  Medication Sig   aspirin 81 MG tablet Take 81 mg by mouth daily as needed (leg pain).   atorvastatin (LIPITOR) 20 MG tablet Take 1 tablet (20 mg total) by mouth daily.   BD PEN NEEDLE NANO 2ND GEN 32G X 4 MM MISC USE 1  TWICE DAILY   Blood Glucose Monitoring Suppl (ONETOUCH VERIO) w/Device KIT Use to check blood sugar 3 times a day.  E11.9   dapagliflozin propanediol (FARXIGA) 10 MG TABS tablet Take 1 tablet (10 mg total) by mouth daily.   gabapentin (NEURONTIN) 300 MG capsule Take 2 capsules (600 mg total) by mouth at bedtime.   glucose blood (ONETOUCH VERIO) test strip USE 1 STRIP TO CHECK GLUCOSE THREE TIMES DAILY   Insulin Glargine (BASAGLAR KWIKPEN) 100 UNIT/ML Inject under skin 30 units daily   letrozole (FEMARA) 2.5 MG tablet Take 1 tablet (2.5 mg total) by mouth daily.   lisinopril-hydrochlorothiazide (ZESTORETIC) 20-25 MG tablet Take 1 tablet by mouth daily.   metFORMIN (GLUCOPHAGE) 1000 MG tablet Take 1 tablet (1,000 mg total) by mouth 2 (two) times daily with a meal.   NON FORMULARY Peripheral Neuropathy cream from [place] (Patient not taking: Reported on 04/16/2022)   Omega-3 Fatty Acids (FISH OIL) 1200 MG CAPS  Take 1,200 mg by mouth daily.    TRUEPLUS LANCETS 33G MISC 1 each by Does not apply route 3 (three) times daily.   No current facility-administered medications on file prior to visit.   Past Medical History:  Diagnosis Date   Blood transfusion without reported diagnosis    Breast cancer [facility]) August 2002   Invasive ductal carcinoma Left breast. 2002, 2017   Diabetes mellitus without complication ([place])    Family history of malignant neoplasm of ovary    Family history of pancreatic cancer    Hypertension    Past Surgical History:  Procedure Laterality Date   BREAST SURGERY Left 2003   COLONOSCOPY     FOOT SURGERY  2000   INSERTION OF MESH N/A 02/17/2018   Procedure: INSERTION OF MESH;  Surgeon: [name], MD;  Location: [place];  Service: General;  Laterality: N/A;   MASTECTOMY Left 05/28/2016   port a cath insertion  05/28/2016   PORT-A-CATH REMOVAL Right 08/19/2017   Procedure: REMOVAL PORT-A-CATH;  Surgeon: [name], MD;  Location: [place];  Service: General;  Laterality: Right;   PORTACATH PLACEMENT Right 05/28/2016   Procedure: INSERTION PORT-A-CATH;  Surgeon: [name], MD;  Location: [place];  Service: General;  Laterality: Right;   TOTAL MASTECTOMY Left 05/28/2016   Procedure: LEFT  MASTECTOMY;  Surgeon: [name], MD;  Location: [place];  Service: General;  Laterality: Left;   TUBAL LIGATION  22 yrs since  2004.   Bilateral.   VENTRAL HERNIA REPAIR  02/17/2018   open   VENTRAL HERNIA REPAIR N/A 02/17/2018   Procedure: OPEN VENTRAL HERNIA REPAIR WITH MESH;  Surgeon: [name], MD;  Location: [place];  Service: General;  Laterality: N/A;   Social History   Socioeconomic History   Marital status: Divorced    Spouse name: Not on file   Number of children: Not on file   Years of education: Not on file   Highest education level: Not on file  Occupational History   Not on file  Tobacco Use   Smoking status: Former    Packs/day: 0.10     Years: 7.00    Total pack years: 0.70    Types: Cigarettes    Quit date: 12/01/1975    Years since quitting: 46.8   Smokeless tobacco: Never  Vaping Use   Vaping Use: Never used  Substance and Sexual Activity   Alcohol use: No    Alcohol/week: 0.0 standard drinks of alcohol   Drug use: No   Sexual activity: Yes  Other Topics Concern   Not on file  Social History Narrative   Not  on file   Social Determinants of Health   Financial Resource Strain: Low Risk  (04/16/2022)   Overall Financial Resource Strain (CARDIA)    Difficulty of Paying Living Expenses: Not hard at all  Food Insecurity: No Food Insecurity (04/16/2022)   Hunger Vital Sign    Worried About Running Out of Food in the Last Year: Never true    Ran Out of Food in the Last Year: Never true  Transportation Needs: No Transportation Needs (04/16/2022)   PRAPARE - Hydrologist (Medical): No    Lack of Transportation (Non-Medical): No  Physical Activity: Insufficiently Active (04/16/2022)   Exercise Vital Sign    Days of Exercise per Week: 4 days    Minutes of Exercise per Session: 30 min  Stress: No Stress Concern Present (04/16/2022)   [place]    Feeling of Stress : Not at all  Social Connections: Moderately Isolated (04/16/2022)   Social Connection and Isolation Panel [NHANES]    Frequency of Communication with Friends and Family: More than three times a week    Frequency of Social Gatherings with Friends and Family: More than three times a week    Attends Religious Services: More than 4 times per year    Active Member of [employer] or Organizations: No    Attends Archivist Meetings: Never    Marital Status: Divorced  Human resources officer Violence: Not At Risk (04/16/2022)   Humiliation, Afraid, Rape, and Kick questionnaire    Fear of Current or Ex-Partner: No    Emotionally Abused: No    Physically Abused: No     Sexually Abused: No    Allergies  Allergen Reactions   Codeine Other (See Comments)    "get crazy", "see things that aren't there"   Family History  Problem Relation Age of Onset   Prostate cancer Father    Ovarian cancer Maternal Aunt 59       deceased   Pancreatic cancer Mother 23       deceased   Cancer Maternal Aunt        2 other mat aunts; unk. primary in 85s; deceased   Cancer Maternal Uncle        "bone ca"; unk. primary; deceased 15s   Prostate cancer Maternal Uncle        deceased 92   Colon cancer Neg Hx    Esophageal cancer Neg Hx    Rectal cancer Neg Hx    Stomach cancer Neg Hx     PE:  There were no vitals taken for this visit. Wt Readings from Last 3 Encounters:  08/06/22 223 lb 11.2 oz (101.5 kg)  02/25/22 219 lb (99.3 kg)  01/19/22 219 lb (99.3 kg)   Constitutional: overweight, in NAD Eyes:  EOMI, no exophthalmos ENT: no neck masses, no cervical lymphadenopathy Cardiovascular: RRR, No MRG Respiratory: CTA B Musculoskeletal: no deformities Skin:no rashes Neurological: no tremor with outstretched hands  ASSESSMENT: 1. DM2, insulin-dependent, uncontrolled, without long-term complications, but with hyperglycemia  2. HL  3.  Obesity class II  PLAN:  1. Patient with longstanding, uncontrolled, type 2 diabetes, on oral antidiabetic regimen with metformin and SGLT2 inhibitor and also long-acting insulin.  In the past, she was able to come off insulin after her sugars improved but we had to start back in 2020.  At last visit, HbA1c was excellent, at 6.2%, decreased from 6.6%.  At that time, sugars were  at goal, with very mild hyperglycemic excursions.  We did not change her regimen then.  She was planning to start going to the gym consistently.  - I suggested to: Patient Instructions  Please continue: - Metformin 1000 mg 2x a day with meals - Farxiga 10 mg daily before b'fast - Basaglar 30 units at bedtime  Please come back for a follow-up  appointment in 4 months.  - we checked her HbA1c: 7%  - advised to check sugars at different times of the day - 1x a day, rotating check times - advised for yearly eye exams >> she is UTD - return to clinic in 4 months  2. HL -Reviewed latest lipid panel from 11/2021: LDL above goal, the rest of the fractions at goal: Lab Results  Component Value Date   CHOL 189 12/25/2021   HDL 51.60 12/25/2021   LDLCALC 108 (H) 12/25/2021   LDLDIRECT 113.0 04/15/2020   TRIG 143.0 12/25/2021   CHOLHDL 4 12/25/2021  -On Lipitor 20 mg daily (dose increased after the above results returned), and fish oil.  No side effects.  3.  Obesity class II -We will continue [place] which should also help with weight loss -She lost 2 pounds before last visit, but previously lost 10 pounds before the prior 2 visits  Philemon Kingdom, MD PhD [facility] Endocrinology

## 2022-09-11 ENCOUNTER — Encounter: Payer: Self-pay | Admitting: Internal Medicine

## 2022-09-11 ENCOUNTER — Ambulatory Visit (INDEPENDENT_AMBULATORY_CARE_PROVIDER_SITE_OTHER): Payer: No Typology Code available for payment source | Admitting: Internal Medicine

## 2022-09-11 ENCOUNTER — Ambulatory Visit (AMBULATORY_SURGERY_CENTER): Payer: Self-pay

## 2022-09-11 VITALS — Ht 68.0 in | Wt 214.0 lb

## 2022-09-11 VITALS — BP 120/68 | HR 93 | Ht 68.0 in | Wt 218.6 lb

## 2022-09-11 DIAGNOSIS — E1169 Type 2 diabetes mellitus with other specified complication: Secondary | ICD-10-CM | POA: Diagnosis not present

## 2022-09-11 DIAGNOSIS — E1165 Type 2 diabetes mellitus with hyperglycemia: Secondary | ICD-10-CM

## 2022-09-11 DIAGNOSIS — E669 Obesity, unspecified: Secondary | ICD-10-CM

## 2022-09-11 DIAGNOSIS — E785 Hyperlipidemia, unspecified: Secondary | ICD-10-CM

## 2022-09-11 DIAGNOSIS — Z794 Long term (current) use of insulin: Secondary | ICD-10-CM | POA: Diagnosis not present

## 2022-09-11 DIAGNOSIS — Z86010 Personal history of colonic polyps: Secondary | ICD-10-CM

## 2022-09-11 MED ORDER — BASAGLAR KWIKPEN 100 UNIT/ML ~~LOC~~ SOPN: PEN_INJECTOR | SUBCUTANEOUS | 3 refills | Status: DC

## 2022-09-11 MED ORDER — ATORVASTATIN CALCIUM 20 MG PO TABS: 20.0000 mg | ORAL_TABLET | Freq: Every day | ORAL | 3 refills | Status: DC

## 2022-09-11 MED ORDER — DAPAGLIFLOZIN PROPANEDIOL 10 MG PO TABS: 10.0000 mg | ORAL_TABLET | Freq: Every day | ORAL | 3 refills | Status: DC

## 2022-09-11 MED ORDER — BD PEN NEEDLE NANO 2ND GEN 32G X 4 MM MISC: 0 refills | Status: DC

## 2022-09-11 MED ORDER — NA SULFATE-K SULFATE-MG SULF 17.5-3.13-1.6 GM/177ML PO SOLN: 1.0000 | Freq: Once | ORAL | 0 refills | Status: AC

## 2022-09-11 MED ORDER — METFORMIN HCL 1000 MG PO TABS: 1000.0000 mg | ORAL_TABLET | Freq: Two times a day (BID) | ORAL | 3 refills | Status: DC

## 2022-09-11 NOTE — Progress Notes (Signed)
Patient ID: Lindsey Marquez, female   DOB: Sep 13, 1951, 71 y.o.   MRN: [id number]   HPI: Lindsey Marquez is a 71 y.o.-year-old female, returning for follow-up for DM2, dx in ~2001, insulin-dependent, uncontrolled, without long-term complications. Last visit 8 months ago.  Interim history: No increased urination, blurry vision, nausea, chest pain. 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  On letrozole.  Reviewed HbA1c levels: Lab Results  Component Value Date   HGBA1C 6.2 (A) 01/19/2022   HGBA1C 6.6 (A) 09/17/2021   HGBA1C 6.9 (A) 06/12/2021   HGBA1C 9.1 (A) 03/04/2021   HGBA1C 7.9 (A) 10/28/2020   HGBA1C 8.4 (H) 04/15/2020   HGBA1C 10.4 (A) 10/04/2019   HGBA1C 9.8 (H) 12/20/2018   HGBA1C 9.4 (A) 09/20/2018   HGBA1C 8.8 (H) 04/05/2018   HGBA1C 7.5 (H) 02/10/2018   HGBA1C 7.4 (H) 01/04/2018   HGBA1C 8.0 (H) 10/04/2017   HGBA1C 6.9 (H) 12/28/2016   HGBA1C 8.0 (H) 09/24/2016   HGBA1C 7.8 (H) 05/26/2016   HGBA1C 9.1 01/27/2016   Pt is on: - Basaglar 20 >> 30 >> 34-36 >> 20 >> 30 units at bedtime - Metformin 1000 mg 2x a day with meals  - Farxiga 10 mg daily before b'fast  GLP1 R agonists were not affordable.  Pt checks her sugars once a day: - am 93-117, 125, 134 >> 96-137, 160 (candy) >> 87-120, 131 - 2h after b'fast: n/c - before lunch: n/c >> 93-98, 122 >> 90s-101 >> n/c - 2h after lunch: n/c >> 344 >> n/c >> 80-174 >> n/c >> 84-143, 190 - before dinner: 180-195 >> 200s >> 103-145, 174 >> n/c - 2h after dinner: 112-191, 216 >> 124-156 >> n/c >> 100-127 >> 96-152, 179 - bedtime:  115-137, 182 >> 92-124, 146 >> n/c - nighttime: n/c Lowest sugar was 103 >> 116 >> 80 >> 96 >> 84; she has hypoglycemia awareness in the 70s. Highest sugar was 216 >> 167 >> 182 >> 146 >> 190.  Glucometer: True metrix (we cannot download this)  Pt's meals are: - Breakfast:coffee, mc muffin, egg - Lunch: [place] sandwich - Dinner: baked chicken, veggies,dessert - apples, - Snacks:  yoghurt, pretzels, PB  -No CKD, last BUN/creatinine:  Lab Results  Component Value Date   BUN 35 (H) 08/06/2022   BUN 30 (H) 02/25/2022   CREATININE 1.08 (H) 08/06/2022   CREATININE 1.07 (H) 02/25/2022  On lisinopril 10.  -+ HL; last set of lipids: Lab Results  Component Value Date   CHOL 189 12/25/2021   HDL 51.60 12/25/2021   LDLCALC 108 (H) 12/25/2021   LDLDIRECT 113.0 04/15/2020   TRIG 143.0 12/25/2021   CHOLHDL 4 12/25/2021  On Lipitor 10 >> 20 mg, fish oil.  - last eye exam was 04/2022: No DR reportedly.  - no numbness and tingling in her feet.  Last foot exam was per Dr. Elisha Ponder 01/28/2022. On ASA 81.  Pt has FH of DM in brother, sister, father.  + h/o BrCA in 2013, recurrence in 2018.  She is cancer free.    ROS: + See HPI  Current Outpatient Medications on File Prior to Visit  Medication Sig   aspirin 81 MG tablet Take 81 mg by mouth daily as needed (leg pain).   atorvastatin (LIPITOR) 20 MG tablet Take 1 tablet (20 mg total) by mouth daily.   BD PEN NEEDLE NANO 2ND GEN 32G X 4 MM MISC USE 1  TWICE DAILY   Blood  Glucose Monitoring Suppl (ONETOUCH VERIO) w/Device KIT Use to check blood sugar 3 times a day. E11.9   dapagliflozin propanediol (FARXIGA) 10 MG TABS tablet Take 1 tablet (10 mg total) by mouth daily.   gabapentin (NEURONTIN) 300 MG capsule Take 2 capsules (600 mg total) by mouth at bedtime.   glucose blood (ONETOUCH VERIO) test strip USE 1 STRIP TO CHECK GLUCOSE THREE TIMES DAILY   Insulin Glargine (BASAGLAR KWIKPEN) 100 UNIT/ML Inject under skin 30 units daily   letrozole (FEMARA) 2.5 MG tablet Take 1 tablet (2.5 mg total) by mouth daily.   lisinopril-hydrochlorothiazide (ZESTORETIC) 20-25 MG tablet Take 1 tablet by mouth daily.   metFORMIN (GLUCOPHAGE) 1000 MG tablet Take 1 tablet (1,000 mg total) by mouth 2 (two) times daily with a meal.   NON FORMULARY Peripheral Neuropathy cream from [facility] (Patient not taking: Reported on 04/16/2022)    Omega-3 Fatty Acids (FISH OIL) 1200 MG CAPS Take 1,200 mg by mouth daily.    TRUEPLUS LANCETS 33G MISC 1 each by Does not apply route 3 (three) times daily.   No current facility-administered medications on file prior to visit.   Past Medical History:  Diagnosis Date   Blood transfusion without reported diagnosis    Breast cancer [facility]) August 2002   Invasive ductal carcinoma Left breast. 2002, 2017   Diabetes mellitus without complication ([place])    Family history of malignant neoplasm of ovary    Family history of pancreatic cancer    Hypertension    Past Surgical History:  Procedure Laterality Date   BREAST SURGERY Left 2003   COLONOSCOPY     FOOT SURGERY  2000   INSERTION OF MESH N/A 02/17/2018   Procedure: INSERTION OF MESH;  Surgeon: [name], MD;  Location: [place];  Service: General;  Laterality: N/A;   MASTECTOMY Left 05/28/2016   port a cath insertion  05/28/2016   PORT-A-CATH REMOVAL Right 08/19/2017   Procedure: REMOVAL PORT-A-CATH;  Surgeon: [name], MD;  Location: [place];  Service: General;  Laterality: Right;   PORTACATH PLACEMENT Right 05/28/2016   Procedure: INSERTION PORT-A-CATH;  Surgeon: [name], MD;  Location: [place];  Service: General;  Laterality: Right;   TOTAL MASTECTOMY Left 05/28/2016   Procedure: LEFT  MASTECTOMY;  Surgeon: [name], MD;  Location: [place];  Service: General;  Laterality: Left;   TUBAL LIGATION  22 yrs since  2004.   Bilateral.   VENTRAL HERNIA REPAIR  02/17/2018   open   VENTRAL HERNIA REPAIR N/A 02/17/2018   Procedure: OPEN VENTRAL HERNIA REPAIR WITH MESH;  Surgeon: [name], MD;  Location: [place];  Service: General;  Laterality: N/A;   Social History   Socioeconomic History   Marital status: Divorced    Spouse name: Not on file   Number of children: Not on file   Years of education: Not on file   Highest education level: Not on file  Occupational History   Not on file  Tobacco Use    Smoking status: Former    Packs/day: 0.10    Years: 7.00    Total pack years: 0.70    Types: Cigarettes    Quit date: 12/01/1975    Years since quitting: 46.8   Smokeless tobacco: Never  Vaping Use   Vaping Use: Never used  Substance and Sexual Activity   Alcohol use: No    Alcohol/week: 0.0 standard drinks of alcohol   Drug use: No   Sexual activity: Yes  Other Topics Concern   Not on file  Social History Narrative   Not on file   Social Determinants of Health   Financial Resource Strain: Low Risk  (04/16/2022)   Overall Financial Resource Strain (CARDIA)    Difficulty of Paying Living Expenses: Not hard at all  Food Insecurity: No Food Insecurity (04/16/2022)   Hunger Vital Sign    Worried About Running Out of Food in the Last Year: Never true    Ran Out of Food in the Last Year: Never true  Transportation Needs: No Transportation Needs (04/16/2022)   PRAPARE - Hydrologist (Medical): No    Lack of Transportation (Non-Medical): No  Physical Activity: Insufficiently Active (04/16/2022)   Exercise Vital Sign    Days of Exercise per Week: 4 days    Minutes of Exercise per Session: 30 min  Stress: No Stress Concern Present (04/16/2022)   [place]    Feeling of Stress : Not at all  Social Connections: Moderately Isolated (04/16/2022)   Social Connection and Isolation Panel [NHANES]    Frequency of Communication with Friends and Family: More than three times a week    Frequency of Social Gatherings with Friends and Family: More than three times a week    Attends Religious Services: More than 4 times per year    Active Member of [employer] or Organizations: No    Attends Archivist Meetings: Never    Marital Status: Divorced  Human resources officer Violence: Not At Risk (04/16/2022)   Humiliation, Afraid, Rape, and Kick questionnaire    Fear of Current or Ex-Partner: No    Emotionally  Abused: No    Physically Abused: No    Sexually Abused: No    Allergies  Allergen Reactions   Codeine Other (See Comments)    "get crazy", "see things that aren't there"   Family History  Problem Relation Age of Onset   Prostate cancer Father    Ovarian cancer Maternal Aunt 51       deceased   Pancreatic cancer Mother 106       deceased   Cancer Maternal Aunt        2 other mat aunts; unk. primary in 22s; deceased   Cancer Maternal Uncle        "bone ca"; unk. primary; deceased 52s   Prostate cancer Maternal Uncle        deceased 11   Colon cancer Neg Hx    Esophageal cancer Neg Hx    Rectal cancer Neg Hx    Stomach cancer Neg Hx     PE:  BP 120/68 (BP Location: Right Arm, Patient Position: Sitting, Cuff Size: Normal)   Pulse 93   Ht 5' 8"  (1.727 m)   Wt 218 lb 9.6 oz (99.2 kg)   SpO2 96%   BMI 33.24 kg/m  Wt Readings from Last 3 Encounters:  09/11/22 218 lb 9.6 oz (99.2 kg)  09/11/22 214 lb (97.1 kg)  08/06/22 223 lb 11.2 oz (101.5 kg)   Constitutional: overweight, in NAD Eyes:  EOMI, no exophthalmos ENT: no neck masses, no cervical lymphadenopathy Cardiovascular: tachycardia, RR, No MRG Respiratory: CTA B Musculoskeletal: no deformities Skin:no rashes Neurological: no tremor with outstretched hands  ASSESSMENT: 1. DM2, insulin-dependent, uncontrolled, without long-term complications, but with hyperglycemia  2. HL  3.  Obesity class II  PLAN:  1. Patient with longstanding, uncontrolled, type 2 diabetes, on oral  antidiabetic regimen with metformin and SGLT2 inhibitor and also long-acting insulin.  In the past, she was able to come off insulin after her sugars improved but we had to start back in 2020.  At last visit, HbA1c was excellent, at 6.2%, decreased from 6.6%.  At that time, sugars were at goal, with very mild hyperglycemic excursions.  We did not change her regimen.  She was planning to start going to the gym consistently. -At today's visit,  reviewing her excellent blood sugar log, the majority of the blood sugars are at goal, with only very mild, rare, hyperglycemic exceptions.  There is no need to change her regimen for now. -She has a colonoscopy coming up at the end of the month and she was already advised to hold metformin in the day of the procedure.  I also advised her to hold [place] starting 3 days prior. - I suggested to: Patient Instructions  Please continue: - Metformin 1000 mg 2x a day with meals - Farxiga 10 mg daily before b'fast - Basaglar 30 units at bedtime  Please come back for a follow-up appointment in 4 months.  - we checked her HbA1c: 6.7% (slightly higher) - advised to check sugars at different times of the day - 1x a day, rotating check times - advised for yearly eye exams >> she is UTD - return to clinic in 4 months  2. HL -Reviewed her latest lipid panel from 11/2021: LDL above goal, the rest of the fractions at goal: Lab Results  Component Value Date   CHOL 189 12/25/2021   HDL 51.60 12/25/2021   LDLCALC 108 (H) 12/25/2021   LDLDIRECT 113.0 04/15/2020   TRIG 143.0 12/25/2021   CHOLHDL 4 12/25/2021  -On Lipitor 20 mg daily, dose increased after the above results returned, and fish oil.  No side effects.  3.  Obesity class II -We will continue [place] which should also help with weight loss -She lost 2 pounds before last visit but previously lost 10 pounds before the prior 2 visits -weight net stable now  Philemon Kingdom, MD PhD [facility] Endocrinology

## 2022-09-11 NOTE — Progress Notes (Signed)
No egg or soy allergy known to patient  No issues known to pt with past sedation with any surgeries or procedures Patient denies ever being told they had issues or difficulty with intubation  No FH of Malignant Hyperthermia Pt is not on diet pills Pt is not on  home 02  Pt is not on blood thinners  Pt denies issues with constipation  No A fib or A flutter Have any cardiac testing pending--no Pt instructed to use Singlecare.[email or web address] or GoodRx for a price reduction on prep   

## 2022-09-11 NOTE — Patient Instructions (Signed)
Please continue: - Metformin 1000 mg 2x a day with meals - Farxiga 10 mg daily before b'fast - Basaglar 30 units at bedtime  Please come back for a follow-up appointment in 4 months.

## 2022-09-13 ENCOUNTER — Other Ambulatory Visit: Payer: Self-pay | Admitting: Internal Medicine

## 2022-09-16 ENCOUNTER — Encounter: Payer: Self-pay | Admitting: Gastroenterology

## 2022-09-19 ENCOUNTER — Encounter: Payer: Self-pay | Admitting: Certified Registered Nurse Anesthetist

## 2022-09-23 ENCOUNTER — Ambulatory Visit (AMBULATORY_SURGERY_CENTER): Payer: No Typology Code available for payment source | Admitting: Gastroenterology

## 2022-09-23 ENCOUNTER — Encounter: Payer: Self-pay | Admitting: Gastroenterology

## 2022-09-23 VITALS — BP 136/62 | HR 88 | Temp 96.6°F | Resp 17 | Ht 68.0 in | Wt 214.0 lb

## 2022-09-23 DIAGNOSIS — Z1211 Encounter for screening for malignant neoplasm of colon: Secondary | ICD-10-CM | POA: Diagnosis not present

## 2022-09-23 DIAGNOSIS — Z86010 Personal history of colonic polyps: Secondary | ICD-10-CM | POA: Diagnosis not present

## 2022-09-23 DIAGNOSIS — D123 Benign neoplasm of transverse colon: Secondary | ICD-10-CM

## 2022-09-23 DIAGNOSIS — I10 Essential (primary) hypertension: Secondary | ICD-10-CM | POA: Diagnosis not present

## 2022-09-23 DIAGNOSIS — E119 Type 2 diabetes mellitus without complications: Secondary | ICD-10-CM | POA: Diagnosis not present

## 2022-09-23 MED ORDER — SODIUM CHLORIDE 0.9 % IV SOLN: 500.0000 mL | Freq: Once | INTRAVENOUS | Status: DC

## 2022-09-23 NOTE — Patient Instructions (Signed)
YOU HAD AN ENDOSCOPIC PROCEDURE TODAY AT [facility]:   Refer to the procedure report that was given to you for any specific questions about what was found during the examination.  If the procedure report does not answer your questions, please call your gastroenterologist to clarify.  If you requested that your care partner not be given the details of your procedure findings, then the procedure report has been included in a sealed envelope for you to review at your convenience later.  **Handouts given on polyps, diverticulosis, and hemorrhoids**   YOU SHOULD EXPECT: Some feelings of bloating in the abdomen. Passage of more gas than usual.  Walking can help get rid of the air that was put into your GI tract during the procedure and reduce the bloating. If you had a lower endoscopy (such as a colonoscopy or flexible sigmoidoscopy) you may notice spotting of blood in your stool or on the toilet paper. If you underwent a bowel prep for your procedure, you may not have a normal bowel movement for a few days.  Please Note:  You might notice some irritation and congestion in your nose or some drainage.  This is from the oxygen used during your procedure.  There is no need for concern and it should clear up in a day or so.  SYMPTOMS TO REPORT IMMEDIATELY:  Following lower endoscopy (colonoscopy or flexible sigmoidoscopy):  Excessive amounts of blood in the stool  Significant tenderness or worsening of abdominal pains  Swelling of the abdomen that is new, acute  Fever of 100F or higher  For urgent or emergent issues, a gastroenterologist can be reached at any hour by calling [phone]. Do not use MyChart messaging for urgent concerns.    DIET:  We do recommend a small meal at first, but then you may proceed to your regular diet.  Drink plenty of fluids but you should avoid alcoholic beverages for 24 hours.  ACTIVITY:  You should plan to take it easy for the rest of today and you  should NOT DRIVE or use heavy machinery until tomorrow (because of the sedation medicines used during the test).    FOLLOW UP: Our staff will call the number listed on your records the next business day following your procedure.  We will call around 7:15- 8:00 am to check on you and address any questions or concerns that you may have regarding the information given to you following your procedure. If we do not reach you, we will leave a message.     If any biopsies were taken you will be contacted by phone or by letter within the next 1-3 weeks.  Please call us at [phone] if you have not heard about the biopsies in 3 weeks.    SIGNATURES/CONFIDENTIALITY: You and/or your care partner have signed paperwork which will be entered into your electronic medical record.  These signatures attest to the fact that that the information above on your After Visit Summary has been reviewed and is understood.  Full responsibility of the confidentiality of this discharge information lies with you and/or your care-partner.

## 2022-09-23 NOTE — Op Note (Signed)
[place] Patient Name: Lindsey Marquez Procedure Date: 09/23/2022 4:00 PM MRN: [id number] Endoscopist: Mauri Pole , MD, [id number] Age: 71 Referring MD:  Date of Birth: 29-Jan-1951 Gender: Female Account #: [id number] Procedure:                Colonoscopy Indications:              Screening for colorectal malignant neoplasm Medicines:                Monitored Anesthesia Care Procedure:                Pre-Anesthesia Assessment:                           - Prior to the procedure, a History and Physical                            was performed, and patient medications and                            allergies were reviewed. The patient's tolerance of                            previous anesthesia was also reviewed. The risks                            and benefits of the procedure and the sedation                            options and risks were discussed with the patient.                            All questions were answered, and informed consent                            was obtained. Prior Anticoagulants: The patient has                            taken no anticoagulant or antiplatelet agents. ASA                            Grade Assessment: II - A patient with mild systemic                            disease. After reviewing the risks and benefits,                            the patient was deemed in satisfactory condition to                            undergo the procedure.                           After obtaining informed consent, the colonoscope  was passed under direct vision. Throughout the                            procedure, the patient's blood pressure, pulse, and                            oxygen saturations were monitored continuously. The                            Olympus PCF-[id number] (#[id number]) Colonoscope was                            introduced through the anus and advanced to the the                            cecum,  identified by appendiceal orifice and                            ileocecal valve. The colonoscopy was performed                            without difficulty. The patient tolerated the                            procedure well. The quality of the bowel                            preparation was good. The ileocecal valve,                            appendiceal orifice, and rectum were photographed. Scope In: 4:13:07 PM Scope Out: 4:36:29 PM Scope Withdrawal Time: 0 hours 9 minutes 3 seconds  Total Procedure Duration: 0 hours 23 minutes 22 seconds  Findings:                 The perianal and digital rectal examinations were                            normal.                           Three sessile polyps were found in the transverse                            colon. The polyps were 3 to 6 mm in size. These                            polyps were removed with a cold snare. Resection                            and retrieval were complete.                           A few small-mouthed diverticula were found in the  sigmoid colon.                           Non-bleeding external and internal hemorrhoids were                            found during retroflexion. The hemorrhoids were                            medium-sized. Complications:            No immediate complications. Estimated Blood Loss:     Estimated blood loss was minimal. Impression:               - Three 3 to 6 mm polyps in the transverse colon,                            removed with a cold snare. Resected and retrieved.                           - Diverticulosis in the sigmoid colon.                           - Non-bleeding external and internal hemorrhoids. Recommendation:           - Patient has a contact number available for                            emergencies. The signs and symptoms of potential                            delayed complications were discussed with the                             patient. Return to normal activities tomorrow.                            Written discharge instructions were provided to the                            patient.                           - Resume previous diet.                           - Continue present medications.                           - Await pathology results.                           - Repeat colonoscopy in 3 - 5 years for                            surveillance based on pathology results. Mauri Pole, MD 09/23/2022 4:44:41 PM This report has been signed electronically.

## 2022-09-23 NOTE — Progress Notes (Signed)
Called to room to assist during endoscopic procedure.  Patient ID and intended procedure confirmed with present staff. Received instructions for my participation in the procedure from the performing physician.  

## 2022-09-23 NOTE — Progress Notes (Signed)
Vitals-DT  Pt's states no medical or surgical changes since previsit or office visit.  

## 2022-09-23 NOTE — Progress Notes (Signed)
Report given to PACU, vss 

## 2022-09-23 NOTE — Progress Notes (Signed)
[facility] History and Physical   Primary Care Physician:  Tower, Wynelle Fanny, MD   Reason for Procedure:  History of adenomatous colon polyps  Plan:    Surveillance colonoscopy with possible interventions as needed     HPI: Lindsey Marquez is a very pleasant 71 y.o. female here for surveillance colonoscopy. Denies any nausea, vomiting, abdominal pain, melena or bright red blood per rectum  The risks and benefits as well as alternatives of endoscopic procedure(s) have been discussed and reviewed. All questions answered. The patient agrees to proceed.    Past Medical History:  Diagnosis Date   Blood transfusion without reported diagnosis    Breast cancer [facility]) August 2002   Invasive ductal carcinoma Left breast. 2002, 2017   Diabetes mellitus without complication ([employer])    Family history of malignant neoplasm of ovary    Family history of pancreatic cancer    Hypertension     Past Surgical History:  Procedure Laterality Date   BREAST SURGERY Left 2003   COLONOSCOPY     FOOT SURGERY  2000   INSERTION OF MESH N/A 02/17/2018   Procedure: INSERTION OF MESH;  Surgeon: [name], MD;  Location: [place];  Service: General;  Laterality: N/A;   MASTECTOMY Left 05/28/2016   port a cath insertion  05/28/2016   PORT-A-CATH REMOVAL Right 08/19/2017   Procedure: REMOVAL PORT-A-CATH;  Surgeon: [name], MD;  Location: [place];  Service: General;  Laterality: Right;   PORTACATH PLACEMENT Right 05/28/2016   Procedure: INSERTION PORT-A-CATH;  Surgeon: [name], MD;  Location: [place];  Service: General;  Laterality: Right;   TOTAL MASTECTOMY Left 05/28/2016   Procedure: LEFT  MASTECTOMY;  Surgeon: [name], MD;  Location: [place];  Service: General;  Laterality: Left;   TUBAL LIGATION  22 yrs since  2004.   Bilateral.   VENTRAL HERNIA REPAIR  02/17/2018   open   VENTRAL HERNIA REPAIR N/A 02/17/2018   Procedure: OPEN VENTRAL HERNIA REPAIR WITH  MESH;  Surgeon: [name], MD;  Location: [place];  Service: General;  Laterality: N/A;    Prior to Admission medications   Medication Sig Start Date End Date Taking? Authorizing Provider  aspirin 81 MG tablet Take 81 mg by mouth daily as needed (leg pain).   Yes [provider]  atorvastatin (LIPITOR) 20 MG tablet Take 1 tablet (20 mg total) by mouth daily. 09/11/22  Yes Philemon Kingdom, MD  dapagliflozin propanediol (FARXIGA) 10 MG TABS tablet Take 1 tablet (10 mg total) by mouth daily. 09/11/22  Yes Philemon Kingdom, MD  gabapentin (NEURONTIN) 300 MG capsule Take 2 capsules (600 mg total) by mouth at bedtime. 08/06/22  Yes Alla Feeling, NP  glucose blood (ONETOUCH VERIO) test strip USE 1 STRIP TO CHECK GLUCOSE THREE TIMES DAILY 07/09/22  Yes Philemon Kingdom, MD  Insulin Glargine (BASAGLAR KWIKPEN) 100 UNIT/ML Inject under skin 30 units daily 09/11/22  Yes Philemon Kingdom, MD  Insulin Pen Needle (BD PEN NEEDLE NANO 2ND GEN) 32G X 4 MM MISC USE 1  TWICE DAILY 09/11/22  Yes Philemon Kingdom, MD  lisinopril-hydrochlorothiazide (ZESTORETIC) 20-25 MG tablet Take 1 tablet by mouth daily. 12/25/21  Yes Tower, Wynelle Fanny, MD  metFORMIN (GLUCOPHAGE) 1000 MG tablet Take 1 tablet (1,000 mg total) by mouth 2 (two) times daily with a meal. 09/11/22  Yes Philemon Kingdom, MD  NON FORMULARY Peripheral Neuropathy cream from [facility]   Yes [provider]  Omega-3 Fatty Acids (  FISH OIL) 1200 MG CAPS Take 1,200 mg by mouth daily.    Yes [provider]  TRUEPLUS LANCETS 33G MISC 1 each by Does not apply route 3 (three) times daily. 11/19/16  Yes Baity, Coralie Keens, NP  Blood Glucose Monitoring Suppl (ONETOUCH VERIO) w/Device KIT Use to check blood sugar 3 times a day. E11.9 01/19/22   Philemon Kingdom, MD  ibuprofen (ADVIL) 800 MG tablet Take 800 mg by mouth every 6 (six) hours as needed. 08/06/22   [provider]  letrozole (FEMARA) 2.5 MG tablet Take 1 tablet  (2.5 mg total) by mouth daily. 08/06/22   Alla Feeling, NP    Current Outpatient Medications  Medication Sig Dispense Refill   aspirin 81 MG tablet Take 81 mg by mouth daily as needed (leg pain).     atorvastatin (LIPITOR) 20 MG tablet Take 1 tablet (20 mg total) by mouth daily. 90 tablet 3   dapagliflozin propanediol (FARXIGA) 10 MG TABS tablet Take 1 tablet (10 mg total) by mouth daily. 90 tablet 3   gabapentin (NEURONTIN) 300 MG capsule Take 2 capsules (600 mg total) by mouth at bedtime. 180 capsule 1   glucose blood (ONETOUCH VERIO) test strip USE 1 STRIP TO CHECK GLUCOSE THREE TIMES DAILY 100 each 3   Insulin Glargine (BASAGLAR KWIKPEN) 100 UNIT/ML Inject under skin 30 units daily 30 mL 3   Insulin Pen Needle (BD PEN NEEDLE NANO 2ND GEN) 32G X 4 MM MISC USE 1  TWICE DAILY 200 each 0   lisinopril-hydrochlorothiazide (ZESTORETIC) 20-25 MG tablet Take 1 tablet by mouth daily. 90 tablet 3   metFORMIN (GLUCOPHAGE) 1000 MG tablet Take 1 tablet (1,000 mg total) by mouth 2 (two) times daily with a meal. 180 tablet 3   NON FORMULARY Peripheral Neuropathy cream from [place] [facility] (FISH OIL) 1200 MG CAPS Take 1,200 mg by mouth daily.      TRUEPLUS LANCETS 33G MISC 1 each by Does not apply route 3 (three) times daily. 600 each 2   Blood Glucose Monitoring Suppl (ONETOUCH VERIO) w/Device KIT Use to check blood sugar 3 times a day. E11.9 1 kit 0   ibuprofen (ADVIL) 800 MG tablet Take 800 mg by mouth every 6 (six) hours as needed.     letrozole (FEMARA) 2.5 MG tablet Take 1 tablet (2.5 mg total) by mouth daily. 90 tablet 3   Current Facility-Administered Medications  Medication Dose Route Frequency Provider Last Rate Last Admin   0.9 %  sodium chloride infusion  500 mL Intravenous Once Mauri Pole, MD        Allergies as of 09/23/2022 - Review Complete 09/23/2022  Allergen Reaction Noted   Codeine Other (See Comments) 11/27/2011    Family History  Problem  Relation Age of Onset   Pancreatic cancer Mother 67       deceased   Prostate cancer Father    Colon polyps Sister    Ovarian cancer Maternal Aunt 39       deceased   Cancer Maternal Aunt        2 other mat aunts; unk. primary in 66s; deceased   Cancer Maternal Uncle        "bone ca"; unk. primary; deceased 57s   Prostate cancer Maternal Uncle        deceased 17   Colon cancer Neg Hx    Esophageal cancer Neg Hx    Rectal cancer Neg Hx  Stomach cancer Neg Hx     Social History   Socioeconomic History   Marital status: Divorced    Spouse name: Not on file   Number of children: Not on file   Years of education: Not on file   Highest education level: Not on file  Occupational History   Not on file  Tobacco Use   Smoking status: Former    Packs/day: 0.10    Years: 7.00    Total pack years: 0.70    Types: Cigarettes    Quit date: 12/01/1975    Years since quitting: 46.8   Smokeless tobacco: Never  Vaping Use   Vaping Use: Never used  Substance and Sexual Activity   Alcohol use: No    Alcohol/week: 0.0 standard drinks of alcohol   Drug use: No   Sexual activity: Yes  Other Topics Concern   Not on file  Social History Narrative   Not on file   Social Determinants of Health   Financial Resource Strain: Low Risk  (04/16/2022)   Overall Financial Resource Strain (CARDIA)    Difficulty of Paying Living Expenses: Not hard at all  Food Insecurity: No Food Insecurity (04/16/2022)   Hunger Vital Sign    Worried About Running Out of Food in the Last Year: Never true    [place] in the Last Year: Never true  Transportation Needs: No Transportation Needs (04/16/2022)   PRAPARE - Hydrologist (Medical): No    Lack of Transportation (Non-Medical): No  Physical Activity: Insufficiently Active (04/16/2022)   Exercise Vital Sign    Days of Exercise per Week: 4 days    Minutes of Exercise per Session: 30 min  Stress: No Stress Concern Present  (04/16/2022)   [place]    Feeling of Stress : Not at all  Social Connections: Moderately Isolated (04/16/2022)   Social Connection and Isolation Panel [NHANES]    Frequency of Communication with Friends and Family: More than three times a week    Frequency of Social Gatherings with Friends and Family: More than three times a week    Attends Religious Services: More than 4 times per year    Active Member of [employer] or Organizations: No    Attends Archivist Meetings: Never    Marital Status: Divorced  Human resources officer Violence: Not At Risk (04/16/2022)   Humiliation, Afraid, Rape, and Kick questionnaire    Fear of Current or Ex-Partner: No    Emotionally Abused: No    Physically Abused: No    Sexually Abused: No    Review of Systems:  All other review of systems negative except as mentioned in the HPI.  Physical Exam: Vital signs in last 24 hours: Blood Pressure (Abnormal) 177/86   Pulse 92   Temperature (Abnormal) 96.6 F (35.9 C) (Temporal)   Respiration 15   Height 5' 8"  (1.727 m)   Weight 214 lb (97.1 kg)   Oxygen Saturation 98%   Body Mass Index 32.54 kg/m  General:   Alert, NAD Lungs:  Clear .   Heart:  Regular rate and rhythm Abdomen:  Soft, nontender and nondistended. Neuro/Psych:  Alert and cooperative. Normal mood and affect. A and O x 3  Reviewed labs, radiology imaging, old records and pertinent past GI work up  Patient is appropriate for planned procedure(s) and anesthesia in an ambulatory setting   K. Denzil Magnuson , MD [phone]

## 2022-09-24 ENCOUNTER — Telehealth: Payer: Self-pay

## 2022-09-24 NOTE — Telephone Encounter (Signed)
  Follow up Call-     09/23/2022    2:35 PM 09/23/2022    2:30 PM  Call back number  Post procedure Call Back phone  # [phone]   Permission to leave phone message  Yes     Patient questions:  Do you have a fever, pain , or abdominal swelling? No. Pain Score  0 *  Have you tolerated food without any problems? Yes.    Have you been able to return to your normal activities? Yes.    Do you have any questions about your discharge instructions: Diet   No. Medications  No. Follow up visit  No.  Do you have questions or concerns about your Care? No.  Actions: * If pain score is 4 or above: No action needed, pain <4.

## 2022-10-04 ENCOUNTER — Encounter: Payer: Self-pay | Admitting: Gastroenterology

## 2022-12-11 ENCOUNTER — Ambulatory Visit (INDEPENDENT_AMBULATORY_CARE_PROVIDER_SITE_OTHER): Payer: No Typology Code available for payment source | Admitting: Podiatry

## 2022-12-11 ENCOUNTER — Encounter: Payer: Self-pay | Admitting: Podiatry

## 2022-12-11 VITALS — BP 128/69

## 2022-12-11 DIAGNOSIS — M79674 Pain in right toe(s): Secondary | ICD-10-CM | POA: Diagnosis not present

## 2022-12-11 DIAGNOSIS — M79675 Pain in left toe(s): Secondary | ICD-10-CM | POA: Diagnosis not present

## 2022-12-11 DIAGNOSIS — L84 Corns and callosities: Secondary | ICD-10-CM

## 2022-12-11 DIAGNOSIS — E1142 Type 2 diabetes mellitus with diabetic polyneuropathy: Secondary | ICD-10-CM | POA: Diagnosis not present

## 2022-12-11 DIAGNOSIS — B351 Tinea unguium: Secondary | ICD-10-CM | POA: Diagnosis not present

## 2022-12-11 NOTE — Progress Notes (Unsigned)
  Subjective:  Patient ID: Lindsey Marquez, female    DOB: 1951/05/01,  MRN: [id number]  Lindsey Marquez presents to clinic today for {jgcomplaint:23593}  Chief Complaint  Patient presents with   Nail Problem    [facility] BS-113 A1C-6.5 PCP-Tower,[place] PCP VST-2023   New problem(s): None. {jgcomplaint:23593}  PCP is Tower, Wynelle Fanny, MD.  Allergies  Allergen Reactions   Codeine Other (See Comments)    "get crazy", "see things that aren't there"    Review of Systems: Negative except as noted in the HPI.  Objective: No changes noted in today's physical examination. There were no vitals filed for this visit. Lindsey Marquez is a pleasant 72 y.o. female {jgbodyhabitus:24098} AAO x 3.  Neurovascular Examination: Neurovascular status unchanged bilaterally. Capillary refill time to digits immediate b/l. Palpable pedal pulses b/l LE. Pedal hair absent. No pain with calf compression b/l. Lower extremity skin temperature gradient within normal limits. No ischemia or gangrene noted b/l LE. No cyanosis or clubbing noted b/l LE.  Pt has subjective symptoms of neuropathy. Protective sensation intact 5/5 intact bilaterally with 10g monofilament b/l. Vibratory sensation intact b/l.  Dermatological:  Pedal skin is warm and supple b/l LE. No open wounds b/l LE. No interdigital macerations noted b/l LE. Toenails bilateral great toes, left 2nd toe and toes 3-5 b/l elongated, discolored, dystrophic, thickened, crumbly with subungual debris and tenderness to dorsal palpation.   There is noted onchyolysis of entire nailplate of the left 2nd toe.  The nailbed remains intact. There is no erythema, no edema, no drainage, no underlying fluctuance.   Hyperkeratotic lesion(s) submet head 1 b/l and submet head 4 right foot.  No erythema, no edema, no drainage, no fluctuance. Porokeratotic lesion(s) left fifth digit. No erythema, no edema, no drainage, no fluctuance.  Musculoskeletal:  Muscle strength 5/5 to  all lower extremity muscle groups bilaterally. HAV with bunion deformity noted b/l LE. Hammertoe deformity noted 1-5 b/l.  Assessment/Plan: No diagnosis found.  No orders of the defined types were placed in this encounter.   None {Jgplan:23602::"-Patient/POA to call should there be question/concern in the interim."}   Return in about 3 months (around 03/12/2023).  Marzetta Board, DPM

## 2022-12-15 ENCOUNTER — Telehealth: Payer: Self-pay

## 2022-12-15 DIAGNOSIS — E1165 Type 2 diabetes mellitus with hyperglycemia: Secondary | ICD-10-CM

## 2022-12-15 MED ORDER — TRESIBA FLEXTOUCH 100 UNIT/ML ~~LOC~~ SOPN: 30.0000 [IU] | PEN_INJECTOR | Freq: Every day | SUBCUTANEOUS | 0 refills | Status: DC

## 2022-12-15 NOTE — Telephone Encounter (Signed)
Pt called to advise Lindsey Marquez is now preferred insulin by insurance.

## 2023-01-14 ENCOUNTER — Other Ambulatory Visit: Payer: Self-pay | Admitting: Nurse Practitioner

## 2023-01-14 DIAGNOSIS — C50112 Malignant neoplasm of central portion of left female breast: Secondary | ICD-10-CM

## 2023-01-15 ENCOUNTER — Ambulatory Visit: Payer: No Typology Code available for payment source | Admitting: Internal Medicine

## 2023-01-25 ENCOUNTER — Other Ambulatory Visit: Payer: Self-pay | Admitting: Nurse Practitioner

## 2023-01-25 DIAGNOSIS — C50112 Malignant neoplasm of central portion of left female breast: Secondary | ICD-10-CM

## 2023-01-26 MED ORDER — GABAPENTIN 300 MG PO CAPS: 600.0000 mg | ORAL_CAPSULE | Freq: Every day | ORAL | 0 refills | Status: DC

## 2023-01-28 ENCOUNTER — Encounter: Payer: Self-pay | Admitting: Family Medicine

## 2023-01-28 ENCOUNTER — Ambulatory Visit (INDEPENDENT_AMBULATORY_CARE_PROVIDER_SITE_OTHER): Payer: No Typology Code available for payment source | Admitting: Family Medicine

## 2023-01-28 ENCOUNTER — Other Ambulatory Visit (INDEPENDENT_AMBULATORY_CARE_PROVIDER_SITE_OTHER): Payer: No Typology Code available for payment source

## 2023-01-28 VITALS — BP 126/54 | HR 99 | Temp 97.4°F | Ht 67.75 in | Wt 226.0 lb

## 2023-01-28 DIAGNOSIS — I10 Essential (primary) hypertension: Secondary | ICD-10-CM

## 2023-01-28 DIAGNOSIS — E785 Hyperlipidemia, unspecified: Secondary | ICD-10-CM

## 2023-01-28 DIAGNOSIS — E6609 Other obesity due to excess calories: Secondary | ICD-10-CM

## 2023-01-28 DIAGNOSIS — Z794 Long term (current) use of insulin: Secondary | ICD-10-CM

## 2023-01-28 DIAGNOSIS — R7989 Other specified abnormal findings of blood chemistry: Secondary | ICD-10-CM

## 2023-01-28 DIAGNOSIS — I872 Venous insufficiency (chronic) (peripheral): Secondary | ICD-10-CM | POA: Diagnosis not present

## 2023-01-28 DIAGNOSIS — G620 Drug-induced polyneuropathy: Secondary | ICD-10-CM

## 2023-01-28 DIAGNOSIS — N1831 Chronic kidney disease, stage 3a: Secondary | ICD-10-CM | POA: Diagnosis not present

## 2023-01-28 DIAGNOSIS — Z0000 Encounter for general adult medical examination without abnormal findings: Secondary | ICD-10-CM | POA: Insufficient documentation

## 2023-01-28 DIAGNOSIS — E1165 Type 2 diabetes mellitus with hyperglycemia: Secondary | ICD-10-CM

## 2023-01-28 DIAGNOSIS — E1169 Type 2 diabetes mellitus with other specified complication: Secondary | ICD-10-CM

## 2023-01-28 DIAGNOSIS — Z6834 Body mass index (BMI) 34.0-34.9, adult: Secondary | ICD-10-CM | POA: Diagnosis not present

## 2023-01-28 DIAGNOSIS — D649 Anemia, unspecified: Secondary | ICD-10-CM | POA: Insufficient documentation

## 2023-01-28 LAB — CBC WITH DIFFERENTIAL/PLATELET
Basophils Absolute: 0 10*3/uL (ref 0.0–0.1)
Basophils Relative: 0.5 % (ref 0.0–3.0)
Eosinophils Absolute: 0.2 10*3/uL (ref 0.0–0.7)
Eosinophils Relative: 2.8 % (ref 0.0–5.0)
HCT: 31.3 % — ABNORMAL LOW (ref 36.0–46.0)
Hemoglobin: 10.7 g/dL — ABNORMAL LOW (ref 12.0–15.0)
Lymphocytes Relative: 27.4 % (ref 12.0–46.0)
Lymphs Abs: 2.1 10*3/uL (ref 0.7–4.0)
MCHC: 34 g/dL (ref 30.0–36.0)
MCV: 99.2 fl (ref 78.0–100.0)
Monocytes Absolute: 0.5 10*3/uL (ref 0.1–1.0)
Monocytes Relative: 7 % (ref 3.0–12.0)
Neutro Abs: 4.9 10*3/uL (ref 1.4–7.7)
Neutrophils Relative %: 62.3 % (ref 43.0–77.0)
Platelets: 342 10*3/uL (ref 150.0–400.0)
RBC: 3.16 Mil/uL — ABNORMAL LOW (ref 3.87–5.11)
RDW: 13.6 % (ref 11.5–15.5)
WBC: 7.8 10*3/uL (ref 4.0–10.5)

## 2023-01-28 LAB — COMPREHENSIVE METABOLIC PANEL
ALT: 18 U/L (ref 0–35)
AST: 19 U/L (ref 0–37)
Albumin: 4 g/dL (ref 3.5–5.2)
Alkaline Phosphatase: 69 U/L (ref 39–117)
BUN: 35 mg/dL — ABNORMAL HIGH (ref 6–23)
CO2: 24 mEq/L (ref 19–32)
Calcium: 9.3 mg/dL (ref 8.4–10.5)
Chloride: 106 mEq/L (ref 96–112)
Creatinine, Ser: 0.94 mg/dL (ref 0.40–1.20)
GFR: 60.98 mL/min (ref >60.00)
Glucose, Bld: 110 mg/dL — ABNORMAL HIGH (ref 70–99)
Potassium: 4.4 mEq/L (ref 3.5–5.1)
Sodium: 139 mEq/L (ref 135–145)
Total Bilirubin: 0.4 mg/dL (ref 0.2–1.2)
Total Protein: 7.1 g/dL (ref 6.0–8.3)

## 2023-01-28 LAB — IBC + FERRITIN
Ferritin: 38.3 ng/mL (ref 10.0–291.0)
Iron: 128 ug/dL (ref 42–145)
Saturation Ratios: 31.6 % (ref 20.0–50.0)
TIBC: 404.6 ug/dL (ref 250.0–450.0)
Transferrin: 289 mg/dL (ref 212.0–360.0)

## 2023-01-28 LAB — LIPID PANEL
Cholesterol: 102 mg/dL (ref 0–200)
HDL: 50.8 mg/dL (ref >39.00)
LDL Cholesterol: 27 mg/dL (ref 0–99)
NonHDL: 51.31
Total CHOL/HDL Ratio: 2
Triglycerides: 120 mg/dL (ref 0.0–149.0)
VLDL: 24 mg/dL (ref 0.0–40.0)

## 2023-01-28 LAB — TSH: TSH: 2.46 u[IU]/mL (ref 0.35–5.50)

## 2023-01-28 NOTE — Assessment & Plan Note (Signed)
Reviewed health habits including diet and exercise and skin cancer prevention Reviewed appropriate screening tests for age  Also reviewed health mt list, fam hx and immunization status , as well as social and family history   See HPI Labs reviewed  Did get shingrix dates Sent for eye exam date  Utd mammogram 07/2022   (continues [facility]) Colonoscopy 08/2022 with 3 y recall for polyps Dexa utd 07/2022 with osteopenia-enc her to continue D and get back to exercise

## 2023-01-28 NOTE — Progress Notes (Signed)
Subjective:    Patient ID: Lindsey Marquez, female    DOB: [birth year], 72 y.o.   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 Adopted 6 kids in the past and 4 are left in household  Tires her out   Doing ok overall  Struggles with neuropathy  Some more leg pain at night   Immunization History  Administered Date(s) Administered   Fluad Quad(high Dose 65+) 08/03/2019, 08/29/2020   Influenza, High Dose Seasonal PF 09/14/2017, 09/03/2021   Influenza,inj,Quad PF,6+ Mos 09/07/2016, 09/29/2018   Influenza-Unspecified 10/31/2015   PFIZER(Purple Top)SARS-COV-2 Vaccination 01/25/2020, 02/20/2020, 10/09/2020   Pneumococcal Conjugate-13 10/04/2017   Pneumococcal Polysaccharide-23 01/27/2016   Td 12/26/2021   Tdap 09/08/2012   Zoster Recombinat (Shingrix) 04/24/2020   Zoster, Live 09/08/2012   Health Maintenance Due  Topic Date Due   Diabetic kidney evaluation - Urine ACR  Never done   Zoster Vaccines- Shingrix (2 of 2) 06/19/2020   OPHTHALMOLOGY EXAM  05/28/2022   HEMOGLOBIN A1C  07/19/2022   Pneumonia Vaccine 3+ Years old (46 of 3 - PPSV23 or PCV20) 10/04/2022   Shingrix-thinks she had at [facility] exam : - thinks it was last year (? Sept)   Colonoscopy 08/2022 with 3 y recall   Mammogram 07/2022 Personal h/o breast cancer  On femara  Self breast exam: no lumps    Dexa  07/2022 -normal  Falls: none  Fractures:none  Supplements : vitamin d 3  Exercise : has gym membership   Fairly balanced diet   HTN bp is stable today  No cp or palpitations or headaches or edema  No side effects to medicines  BP Readings from Last 3 Encounters:  01/28/23 (!)  126/54  12/11/22 128/69  09/23/22 136/62    Lisinopril 20-25 mg daily   Lab Results  Component Value Date   CREATININE 1.08 (H) 08/06/2022   BUN 35 (H) 08/06/2022   NA 138 08/06/2022   K 4.5 08/06/2022   CL 108 08/06/2022   CO2 22 08/06/2022   Due for labs  GFR of 55  DM2 Lab Results  Component Value Date   HGBA1C 6.2 (A) 01/19/2022  Under care of Dr Cruzita Lederer Jaclyn Prime  Thinks last A1c was 6.7  Has appt in march    Hyperlipidemia Lab Results  Component Value Date   CHOL 189 12/25/2021   HDL 51.60 12/25/2021   LDLCALC 108 (H) 12/25/2021   LDLDIRECT 113.0 04/15/2020   TRIG 143.0 12/25/2021   CHOLHDL 4 12/25/2021   Atorvastatin  5 mg daily   Patient Active Problem List   Diagnosis Date Noted   Routine general medical examination at a health care facility 01/28/2023   Venous insufficiency 01/28/2023   Bilateral hip pain 07/17/2021   CKD (chronic kidney disease) stage 3, GFR 30-59 ml/min ([place]) 06/05/2021   Screening for malignant neoplasm of colon 02/17/2021   History of colonic polyps 11/18/2020   Drug-induced polyneuropathy ([place]) 04/15/2020   Osteoarthritis 04/15/2020   Hyperlipidemia associated with type 2 diabetes mellitus ([place]) 09/20/2018   Class 1 obesity due to  excess calories with body mass index (BMI) of 34.0 to 34.9 in adult 09/20/2018   Cancer of central portion of left breast ([place]) 05/07/2016   HTN (hypertension) 03/24/2016   Type 2 diabetes mellitus with hyperglycemia, with long-term current use of insulin ([place]) 03/24/2016   Past Medical History:  Diagnosis Date   Blood transfusion without reported diagnosis    Breast cancer ([place]) 06/2001   Invasive ductal carcinoma Left breast. 2002, 2017   Diabetes mellitus without complication ([place])    Difficult intravenous access    Family history of malignant neoplasm of ovary    Family history of pancreatic cancer    Hypertension    Past Surgical History:  Procedure Laterality Date   BREAST SURGERY Left 2003    COLONOSCOPY     FOOT SURGERY  2000   INSERTION OF MESH N/A 02/17/2018   Procedure: INSERTION OF MESH;  Surgeon: [name], MD;  Location: [place];  Service: General;  Laterality: N/A;   MASTECTOMY Left 05/28/2016   port a cath insertion  05/28/2016   PORT-A-CATH REMOVAL Right 08/19/2017   Procedure: REMOVAL PORT-A-CATH;  Surgeon: [name], MD;  Location: [place];  Service: General;  Laterality: Right;   PORTACATH PLACEMENT Right 05/28/2016   Procedure: INSERTION PORT-A-CATH;  Surgeon: [name], MD;  Location: [place] [facility];  Service: General;  Laterality: Right;   TOTAL MASTECTOMY Left 05/28/2016   Procedure: LEFT  MASTECTOMY;  Surgeon: [name], MD;  Location: [place];  Service: General;  Laterality: Left;   TUBAL LIGATION  22 yrs since  2004.   Bilateral.   VENTRAL HERNIA REPAIR  02/17/2018   open   VENTRAL HERNIA REPAIR N/A 02/17/2018   Procedure: OPEN VENTRAL HERNIA REPAIR WITH MESH;  Surgeon: [name], MD;  Location: [place];  Service: General;  Laterality: N/A;   Social History   Tobacco Use   Smoking status: Former    Packs/day: 0.10    Years: 7.00    Total pack years: 0.70    Types: Cigarettes    Quit date: 12/01/1975    Years since quitting: 47.1   Smokeless tobacco: Never  Vaping Use   Vaping Use: Never used  Substance Use Topics   Alcohol use: No    Alcohol/week: 0.0 standard drinks of alcohol   Drug use: No   Family History  Problem Relation Age of Onset   Pancreatic cancer Mother 11       deceased   Prostate cancer Father    Colon polyps Sister    Ovarian cancer Maternal Aunt 43       deceased   Cancer Maternal Aunt        2 other mat aunts; unk. primary in 71s; deceased   Cancer Maternal Uncle        "bone ca"; unk. primary; deceased 2s   Prostate cancer Maternal Uncle        deceased 80   Colon cancer Neg Hx    Esophageal cancer Neg Hx    Rectal cancer Neg Hx    Stomach cancer Neg Hx    Allergies  Allergen Reactions    Codeine Other (See Comments)    "get crazy", "see things that aren't there"   Current Outpatient Medications on File Prior to Visit  Medication Sig Dispense Refill   aspirin 81 MG tablet Take 81 mg by mouth daily as needed (leg pain).     atorvastatin (LIPITOR) 20 MG tablet Take 1 tablet (20 mg  total) by mouth daily. 90 tablet 3   Blood Glucose Monitoring Suppl (ONETOUCH VERIO) w/Device KIT Use to check blood sugar 3 times a day. E11.9 1 kit 0   dapagliflozin propanediol (FARXIGA) 10 MG TABS tablet Take 1 tablet (10 mg total) by mouth daily. 90 tablet 3   gabapentin (NEURONTIN) 300 MG capsule Take 2 capsules (600 mg total) by mouth at bedtime. 180 capsule 0   glucose blood (ONETOUCH VERIO) test strip USE 1 STRIP TO CHECK GLUCOSE THREE TIMES DAILY 100 each 3   ibuprofen (ADVIL) 800 MG tablet Take 800 mg by mouth every 6 (six) hours as needed.     insulin degludec (TRESIBA FLEXTOUCH) 100 UNIT/ML FlexTouch Pen Inject 30 Units into the skin daily. 30 mL 0   Insulin Pen Needle (BD PEN NEEDLE NANO 2ND GEN) 32G X 4 MM MISC USE 1  TWICE DAILY 200 each 0   letrozole (FEMARA) 2.5 MG tablet Take 1 tablet (2.5 mg total) by mouth daily. 90 tablet 3   lisinopril-hydrochlorothiazide (ZESTORETIC) 20-25 MG tablet Take 1 tablet by mouth daily. 90 tablet 3   metFORMIN (GLUCOPHAGE) 1000 MG tablet Take 1 tablet (1,000 mg total) by mouth 2 (two) times daily with a meal. 180 tablet 3   NON FORMULARY Peripheral Neuropathy cream from [employer] (FISH OIL) 1200 MG CAPS Take 1,200 mg by mouth daily.      TRUEPLUS LANCETS 33G MISC 1 each by Does not apply route 3 (three) times daily. 600 each 2   No current facility-administered medications on file prior to visit.    Review of Systems  Constitutional:  Positive for fatigue. Negative for activity change, appetite change, fever and unexpected weight change.  HENT:  Negative for congestion, ear pain, rhinorrhea, sinus pressure and sore  throat.   Eyes:  Negative for pain, redness and visual disturbance.  Respiratory:  Negative for cough, shortness of breath and wheezing.   Cardiovascular:  Negative for chest pain and palpitations.  Gastrointestinal:  Negative for abdominal pain, blood in stool, constipation and diarrhea.  Endocrine: Negative for polydipsia and polyuria.  Genitourinary:  Negative for dysuria, frequency and urgency.  Musculoskeletal:  Negative for arthralgias, back pain and myalgias.  Skin:  Negative for pallor and rash.  Allergic/Immunologic: Negative for environmental allergies.  Neurological:  Negative for dizziness, syncope and headaches.  Hematological:  Negative for adenopathy. Does not bruise/bleed easily.  Psychiatric/Behavioral:  Negative for decreased concentration and dysphoric mood. The patient is not nervous/anxious.        Objective:   Physical Exam Constitutional:      General: She is not in acute distress.    Appearance: Normal appearance. She is well-developed. She is not ill-appearing or diaphoretic.  HENT:     Head: Normocephalic and atraumatic.     Right Ear: Tympanic membrane, ear canal and external ear normal.     Left Ear: Tympanic membrane, ear canal and external ear normal.     Nose: Nose normal. No congestion.     Mouth/Throat:     Mouth: Mucous membranes are moist.     Pharynx: Oropharynx is clear. No posterior oropharyngeal erythema.  Eyes:     General: No scleral icterus.    Extraocular Movements: Extraocular movements intact.     Conjunctiva/sclera: Conjunctivae normal.     Pupils: Pupils are equal, round, and reactive to light.  Neck:     Thyroid: No thyromegaly.     Vascular: No  carotid bruit or JVD.  Cardiovascular:     Rate and Rhythm: Normal rate and regular rhythm.     Pulses: Normal pulses.     Heart sounds: Normal heart sounds.     No gallop.  Pulmonary:     Effort: Pulmonary effort is normal. No respiratory distress.     Breath sounds: Normal breath  sounds. No wheezing.     Comments: Good air exch Chest:     Chest wall: No tenderness.  Abdominal:     General: Abdomen is protuberant. Bowel sounds are normal. There is no distension or abdominal bruit.     Palpations: Abdomen is soft. There is no mass.     Tenderness: There is no abdominal tenderness.     Hernia: No hernia is present.  Genitourinary:    Comments: Breast exam done by onc/ gyn specialist  Musculoskeletal:        General: No tenderness. Normal range of motion.     Cervical back: Normal range of motion and neck supple. No rigidity. No muscular tenderness.     Right lower leg: Edema present.     Left lower leg: Edema present.     Comments: No kyphosis   Trace pedal edema with varicosities   Lymphadenopathy:     Cervical: No cervical adenopathy.  Skin:    General: Skin is warm and dry.     Coloration: Skin is not pale.     Findings: No erythema or rash.     Comments: Some hyperpigmentation post neck Also around veins in ankles   Neurological:     Mental Status: She is alert. Mental status is at baseline.     Cranial Nerves: No cranial nerve deficit.     Motor: No abnormal muscle tone.     Coordination: Coordination normal.     Gait: Gait normal.     Deep Tendon Reflexes: Reflexes are normal and symmetric. Reflexes normal.  Psychiatric:        Mood and Affect: Mood normal.        Cognition and Memory: Cognition and memory normal.           Assessment & Plan:   Problem List Items Addressed This Visit       Cardiovascular and Mediastinum   HTN (hypertension)    bp in fair control at this time  BP Readings from Last 1 Encounters:  01/28/23 (!) 126/54  No changes needed Most recent labs reviewed  Disc lifstyle change with low sodium diet and exercise  Plan to continue lisinoprol hct 20-25 mg once daily  Labs ordered      Relevant Orders   TSH   Lipid panel   Comprehensive metabolic panel   CBC with Differential/Platelet   Venous insufficiency     LEs Some swelling and hyperpigmentation of old wounds  Would benefit from supp socks or hose to knee Discussed this May help with leg pain         Endocrine   Hyperlipidemia associated with type 2 diabetes mellitus ([place])    Lipids ordered  Disc goals for lipids and reasons to control them Last LDL was 108 Rev last labs with pt Rev low sat fat diet in detail Plan to continue atorvastatin 5 mg daily (may be causing some leg aches- recommended trial of co Q 10)  Leg pain may also be from neuropathy and femara also       Type 2 diabetes mellitus with hyperglycemia, with long-term current use of  insulin ([place])    Per pt doing well  with last A1c of 6.7  Under endo care  Enc low glycemic diet  Sent for last dm eye exam        Nervous and Auditory   Drug-induced polyneuropathy ([facility])    Continues gabapentin  Fairly stable          Genitourinary   CKD (chronic kidney disease) stage 3, GFR 30-59 ml/min ([facility])    Bmet ordered Doing well with water intake       Relevant Orders   Comprehensive metabolic panel     Other   Class 1 obesity due to excess calories with body mass index (BMI) of 34.0 to 34.9 in adult    Discussed how this problem influences overall health and the risks it imposes  Reviewed plan for weight loss with lower calorie diet (via better food choices and also portion control or program like weight watchers) and exercise building up to or more than 30 minutes 5 days per week including some aerobic activity   Plans to get back to the gym      Routine general medical examination at a health care facility - Primary    Reviewed health habits including diet and exercise and skin cancer prevention Reviewed appropriate screening tests for age  Also reviewed health mt list, fam hx and immunization status , as well as social and family history   See HPI Labs reviewed  Did get shingrix dates Sent for eye exam date  Utd mammogram 07/2022   (continues  femara) Colonoscopy 08/2022 with 3 y recall for polyps Dexa utd 07/2022 with osteopenia-enc her to continue D and get back to exercise

## 2023-01-28 NOTE — Assessment & Plan Note (Signed)
Lipids ordered  Disc goals for lipids and reasons to control them Last LDL was 108 Rev last labs with pt Rev low sat fat diet in detail Plan to continue atorvastatin 5 mg daily (may be causing some leg aches- recommended trial of co Q 10)  Leg pain may also be from neuropathy and femara also

## 2023-01-28 NOTE — Patient Instructions (Addendum)
Look for co enzyme Q 10  It may help prevent aches/ pains from your cholesterol medicine   Go back to the gym  Do some cardio and some strength training   Knee high support socks or hose may help your veins and swelling and would probably help pain and healing also  [place], drug store or department store   Get a good water intake for kidney health   Labs today

## 2023-01-28 NOTE — Assessment & Plan Note (Signed)
Continues gabapentin  Fairly stable

## 2023-01-28 NOTE — Assessment & Plan Note (Signed)
Per pt doing well  with last A1c of 6.7  Under endo care  Enc low glycemic diet  Sent for last dm eye exam

## 2023-01-28 NOTE — Assessment & Plan Note (Signed)
Discussed how this problem influences overall health and the risks it imposes  Reviewed plan for weight loss with lower calorie diet (via better food choices and also portion control or program like weight watchers) and exercise building up to or more than 30 minutes 5 days per week including some aerobic activity   Plans to get back to the gym

## 2023-01-28 NOTE — Assessment & Plan Note (Signed)
bp in fair control at this time  BP Readings from Last 1 Encounters:  01/28/23 (!) 126/54   No changes needed Most recent labs reviewed  Disc lifstyle change with low sodium diet and exercise  Plan to continue lisinoprol hct 20-25 mg once daily  Labs ordered

## 2023-01-28 NOTE — Assessment & Plan Note (Signed)
LEs Some swelling and hyperpigmentation of old wounds  Would benefit from supp socks or hose to knee Discussed this May help with leg pain

## 2023-01-28 NOTE — Assessment & Plan Note (Signed)
Bmet ordered Doing well with water intake

## 2023-01-29 ENCOUNTER — Other Ambulatory Visit: Payer: Self-pay

## 2023-01-29 MED ORDER — LISINOPRIL-HYDROCHLOROTHIAZIDE 20-25 MG PO TABS: 1.0000 | ORAL_TABLET | Freq: Every day | ORAL | 3 refills | Status: DC

## 2023-01-29 NOTE — Addendum Note (Signed)
Addended by: Loura Pardon A on: 01/29/2023 11:13 AM   Modules accepted: Orders

## 2023-02-17 ENCOUNTER — Encounter: Payer: Self-pay | Admitting: Internal Medicine

## 2023-02-17 ENCOUNTER — Ambulatory Visit (INDEPENDENT_AMBULATORY_CARE_PROVIDER_SITE_OTHER): Payer: No Typology Code available for payment source | Admitting: Internal Medicine

## 2023-02-17 VITALS — BP 138/74 | HR 104 | Ht 67.75 in | Wt 218.6 lb

## 2023-02-17 DIAGNOSIS — E1165 Type 2 diabetes mellitus with hyperglycemia: Secondary | ICD-10-CM

## 2023-02-17 DIAGNOSIS — E669 Obesity, unspecified: Secondary | ICD-10-CM | POA: Diagnosis not present

## 2023-02-17 DIAGNOSIS — Z794 Long term (current) use of insulin: Secondary | ICD-10-CM

## 2023-02-17 DIAGNOSIS — E1169 Type 2 diabetes mellitus with other specified complication: Secondary | ICD-10-CM | POA: Diagnosis not present

## 2023-02-17 DIAGNOSIS — E785 Hyperlipidemia, unspecified: Secondary | ICD-10-CM

## 2023-02-17 LAB — POCT GLYCOSYLATED HEMOGLOBIN (HGB A1C): Hemoglobin A1C: 7 % — AB (ref 4.0–5.6)

## 2023-02-17 MED ORDER — TRESIBA FLEXTOUCH 100 UNIT/ML ~~LOC~~ SOPN: 30.0000 [IU] | PEN_INJECTOR | Freq: Every day | SUBCUTANEOUS | 3 refills | Status: DC

## 2023-02-17 NOTE — Patient Instructions (Addendum)
Please continue: - Metformin 1000 mg 2x a day with meals - Farxiga 10 mg daily before b'fast - Tresiba 30 units at bedtime  Please come back for a follow-up appointment in 4 months.

## 2023-02-17 NOTE — Progress Notes (Signed)
Patient ID: Lindsey Marquez, female   DOB: 05/30/1951, 72 y.o.   MRN: [id number]   HPI: Lindsey Marquez is a 72 y.o.-year-old female, returning for follow-up for DM2, dx in ~2001, insulin-dependent, uncontrolled, without long-term complications. Last visit 5 months ago.  Interim history: No increased urination, blurry vision, nausea, chest pain. 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  On letrozole.  Reviewed HbA1c levels: Lab Results  Component Value Date   HGBA1C 6.2 (A) 01/19/2022   HGBA1C 6.6 (A) 09/17/2021   HGBA1C 6.9 (A) 06/12/2021   HGBA1C 9.1 (A) 03/04/2021   HGBA1C 7.9 (A) 10/28/2020   HGBA1C 8.4 (H) 04/15/2020   HGBA1C 10.4 (A) 10/04/2019   HGBA1C 9.8 (H) 12/20/2018   HGBA1C 9.4 (A) 09/20/2018   HGBA1C 8.8 (H) 04/05/2018   HGBA1C 7.5 (H) 02/10/2018   HGBA1C 7.4 (H) 01/04/2018   HGBA1C 8.0 (H) 10/04/2017   HGBA1C 6.9 (H) 12/28/2016   HGBA1C 8.0 (H) 09/24/2016   HGBA1C 7.8 (H) 05/26/2016   HGBA1C 9.1 01/27/2016   Pt is on: - Basaglar 20 >> 30 >> 34-36 >> 20 >> Tresiba 30 units at bedtime - Metformin 1000 mg 2x a day with meals  - Farxiga 10 mg daily before b'fast  GLP1 R agonists were not affordable.  Pt checks her sugars 2-3 a day per review of her log: - am 93-117, 125, 134 >> 96-137, 160 (candy) >> 87-120, 131 >> 86-122 - 2h after b'fast: n/c - before lunch: n/c >> 93-98, 122 >> 90s-101 >> n/c - 2h after lunch: 80-174 >> n/c >> 84-143, 190 >> 85, 99-158 - before dinner: 180-195 >> 200s >> 103-145, 174 >> n/c - 2h after dinner:  100-127 >> 96-152, 179 >> 114-166, 189, 199 - bedtime:  115-137, 182 >> 92-124, 146 >> n/c - nighttime: n/c Lowest sugar was 103 >> 116 >> 80 >> 96 >> 84 >> 85; she has hypoglycemia awareness in the 70s. Highest sugar was 216 >> 167 >> 182 >> 146 >> 190 >> 199.  Glucometer: True metrix (we cannot download this)  Pt's meals are: - Breakfast:coffee, mc muffin, egg - Lunch: [place] sandwich - Dinner: baked chicken,  veggies,dessert - apples, - Snacks: yoghurt, pretzels, PB  -No CKD, last BUN/creatinine:  Lab Results  Component Value Date   BUN 35 (H) 01/28/2023   BUN 35 (H) 08/06/2022   CREATININE 0.94 01/28/2023   CREATININE 1.08 (H) 08/06/2022  On lisinopril 10.  -+ HL; last set of lipids: Lab Results  Component Value Date   CHOL 102 01/28/2023   HDL 50.80 01/28/2023   LDLCALC 27 01/28/2023   LDLDIRECT 113.0 04/15/2020   TRIG 120.0 01/28/2023   CHOLHDL 2 01/28/2023  On Lipitor 10 >> 20 mg, fish oil.  - last eye exam was 04/2022: No DR reportedly.  - no numbness and tingling in her feet.  Last foot exam was per Dr. Elisha Marquez 12/2022. On ASA 81.  Pt has FH of DM in brother, sister, father.  + h/o BrCA in 2013, recurrence in 2018.  She is cancer free.    ROS: + See HPI  Current Outpatient Medications on File Prior to Visit  Medication Sig   aspirin 81 MG tablet Take 81 mg by mouth daily as needed (leg pain).   atorvastatin (LIPITOR) 20 MG tablet Take 1 tablet (20 mg total) by mouth daily.   Blood Glucose Monitoring Suppl (ONETOUCH VERIO) w/Device KIT Use to check blood sugar 3  times a day. E11.9   dapagliflozin propanediol (FARXIGA) 10 MG TABS tablet Take 1 tablet (10 mg total) by mouth daily.   gabapentin (NEURONTIN) 300 MG capsule Take 2 capsules (600 mg total) by mouth at bedtime.   glucose blood (ONETOUCH VERIO) test strip USE 1 STRIP TO CHECK GLUCOSE THREE TIMES DAILY   ibuprofen (ADVIL) 800 MG tablet Take 800 mg by mouth every 6 (six) hours as needed.   insulin degludec (TRESIBA FLEXTOUCH) 100 UNIT/ML FlexTouch Pen Inject 30 Units into the skin daily.   Insulin Pen Needle (BD PEN NEEDLE NANO 2ND GEN) 32G X 4 MM MISC USE 1  TWICE DAILY   letrozole (FEMARA) 2.5 MG tablet Take 1 tablet (2.5 mg total) by mouth daily.   lisinopril-hydrochlorothiazide (ZESTORETIC) 20-25 MG tablet Take 1 tablet by mouth daily.   metFORMIN (GLUCOPHAGE) 1000 MG tablet Take 1 tablet (1,000 mg total) by  mouth 2 (two) times daily with a meal.   NON FORMULARY Peripheral Neuropathy cream from [place] [employer] (FISH OIL) 1200 MG CAPS Take 1,200 mg by mouth daily.    TRUEPLUS LANCETS 33G MISC 1 each by Does not apply route 3 (three) times daily.   No current facility-administered medications on file prior to visit.   Past Medical History:  Diagnosis Date   Blood transfusion without reported diagnosis    Breast cancer ([place]) 06/2001   Invasive ductal carcinoma Left breast. 2002, 2017   Diabetes mellitus without complication ([place])    Difficult intravenous access    Family history of malignant neoplasm of ovary    Family history of pancreatic cancer    Hypertension    Past Surgical History:  Procedure Laterality Date   BREAST SURGERY Left 2003   COLONOSCOPY     FOOT SURGERY  2000   INSERTION OF MESH N/A 02/17/2018   Procedure: INSERTION OF MESH;  Surgeon: [name], MD;  Location: [facility];  Service: General;  Laterality: N/A;   MASTECTOMY Left 05/28/2016   port a cath insertion  05/28/2016   PORT-A-CATH REMOVAL Right 08/19/2017   Procedure: REMOVAL PORT-A-CATH;  Surgeon: [name], MD;  Location: [facility];  Service: General;  Laterality: Right;   PORTACATH PLACEMENT Right 05/28/2016   Procedure: INSERTION PORT-A-CATH;  Surgeon: [name], MD;  Location: [facility];  Service: General;  Laterality: Right;   TOTAL MASTECTOMY Left 05/28/2016   Procedure: LEFT  MASTECTOMY;  Surgeon: [name], MD;  Location: [facility];  Service: General;  Laterality: Left;   TUBAL LIGATION  22 yrs since  2004.   Bilateral.   VENTRAL HERNIA REPAIR  02/17/2018   open   VENTRAL HERNIA REPAIR N/A 02/17/2018   Procedure: OPEN VENTRAL HERNIA REPAIR WITH MESH;  Surgeon: [name], MD;  Location: [facility];  Service: General;  Laterality: N/A;   Social History   Socioeconomic History   Marital status: Divorced    Spouse name: Not on file   Number of children: Not  on file   Years of education: Not on file   Highest education level: Not on file  Occupational History   Not on file  Tobacco Use   Smoking status: Former    Packs/day: 0.10    Years: 7.00    Additional pack years: 0.00    Total pack years: 0.70    Types: Cigarettes    Quit date: 12/01/1975    Years since quitting: 47.2   Smokeless tobacco: Never  Vaping Use  Vaping Use: Never used  Substance and Sexual Activity   Alcohol use: No    Alcohol/week: 0.0 standard drinks of alcohol   Drug use: No   Sexual activity: Yes  Other Topics Concern   Not on file  Social History Narrative   Not on file   Social Determinants of Health   Financial Resource Strain: Low Risk  (04/16/2022)   Overall Financial Resource Strain (CARDIA)    Difficulty of Paying Living Expenses: Not hard at all  Food Insecurity: No Food Insecurity (04/16/2022)   Hunger Vital Sign    Worried About Running Out of Food in the Last Year: Never true    Ran Out of Food in the Last Year: Never true  Transportation Needs: No Transportation Needs (04/16/2022)   PRAPARE - Hydrologist (Medical): No    Lack of Transportation (Non-Medical): No  Physical Activity: Insufficiently Active (04/16/2022)   Exercise Vital Sign    Days of Exercise per Week: 4 days    Minutes of Exercise per Session: 30 min  Stress: No Stress Concern Present (04/16/2022)   [place]    Feeling of Stress : Not at all  Social Connections: Moderately Isolated (04/16/2022)   Social Connection and Isolation Panel [NHANES]    Frequency of Communication with Friends and Family: More than three times a week    Frequency of Social Gatherings with Friends and Family: More than three times a week    Attends Religious Services: More than 4 times per year    Active Member of [employer] or Organizations: No    Attends Archivist Meetings: Never    Marital Status:  Divorced  Human resources officer Violence: Not At Risk (04/16/2022)   Humiliation, Afraid, Rape, and Kick questionnaire    Fear of Current or Ex-Partner: No    Emotionally Abused: No    Physically Abused: No    Sexually Abused: No    Allergies  Allergen Reactions   Codeine Other (See Comments)    "get crazy", "see things that aren't there"   Family History  Problem Relation Age of Onset   Pancreatic cancer Mother 18       deceased   Prostate cancer Father    Colon polyps Sister    Ovarian cancer Maternal Aunt 21       deceased   Cancer Maternal Aunt        2 other mat aunts; unk. primary in 75s; deceased   Cancer Maternal Uncle        "bone ca"; unk. primary; deceased 42s   Prostate cancer Maternal Uncle        deceased 45   Colon cancer Neg Hx    Esophageal cancer Neg Hx    Rectal cancer Neg Hx    Stomach cancer Neg Hx     PE:  BP 138/74 (BP Location: Left Arm, Patient Position: Sitting, Cuff Size: Normal)   Pulse (!) 104   Ht 5' 7.75" (1.721 m)   Wt 218 lb 9.6 oz (99.2 kg)   SpO2 97%   BMI 33.48 kg/m  Wt Readings from Last 3 Encounters:  02/17/23 218 lb 9.6 oz (99.2 kg)  01/28/23 226 lb (102.5 kg)  09/23/22 214 lb (97.1 kg)   Constitutional: overweight, in NAD Eyes:  EOMI, no exophthalmos ENT: no neck masses, no cervical lymphadenopathy Cardiovascular: RRR, No MRG Respiratory: CTA B Musculoskeletal: no deformities Skin:no rashes Neurological: no  tremor with outstretched hands  ASSESSMENT: 1. DM2, insulin-dependent, uncontrolled, without long-term complications, but with hyperglycemia  2. HL  3.  Obesity class II  PLAN:  1. Patient with longstanding, uncontrolled, type 2 diabetes, on oral antidiabetic regimen with metformin and SGLT2 inhibitor and also long-acting insulin.  Her HbA1c was higher, at 6.7% at last visit, but we did not change her regimen since the majority of her blood sugars were at goal.  She was also planning to start going to the gym  consistently. -At today's visit, sugars remain mostly at goal, but they were higher earlier in the year.  For now, I again do not recommend a change in the regimen.  Will continue [place], which had to be changed from [employer] per insurance preference.  I refilled this for her today - I suggested to: Patient Instructions  Please continue: - Metformin 1000 mg 2x a day with meals - Farxiga 10 mg daily before b'fast - Tresiba 30 units at bedtime  Please come back for a follow-up appointment in 4 months.  - we checked her HbA1c: 7.0% (higher) - advised to check sugars at different times of the day - 1x a day, rotating check times - advised for yearly eye exams >> she is UTD - return to clinic in 4 months  2. HL -Reviewed latest lipid panel from 12/2022: All fractions at goal: Lab Results  Component Value Date   CHOL 102 01/28/2023   HDL 50.80 01/28/2023   LDLCALC 27 01/28/2023   LDLDIRECT 113.0 04/15/2020   TRIG 120.0 01/28/2023   CHOLHDL 2 01/28/2023  -On Lipitor 20 mg daily and fish oil.  No side effects  3.  Obesity class II -Will continue [place] which should also help with weight loss -Weight was stable at last visit, now gained 4 lbs   Philemon Kingdom, MD PhD [facility] Endocrinology

## 2023-02-19 ENCOUNTER — Other Ambulatory Visit: Payer: Self-pay | Admitting: Internal Medicine

## 2023-02-20 ENCOUNTER — Other Ambulatory Visit: Payer: Self-pay | Admitting: Internal Medicine

## 2023-02-20 DIAGNOSIS — E1165 Type 2 diabetes mellitus with hyperglycemia: Secondary | ICD-10-CM

## 2023-03-18 ENCOUNTER — Other Ambulatory Visit: Payer: Self-pay | Admitting: Internal Medicine

## 2023-03-18 DIAGNOSIS — Z794 Long term (current) use of insulin: Secondary | ICD-10-CM

## 2023-03-22 ENCOUNTER — Telehealth: Payer: Self-pay | Admitting: Internal Medicine

## 2023-03-22 DIAGNOSIS — E1165 Type 2 diabetes mellitus with hyperglycemia: Secondary | ICD-10-CM

## 2023-03-22 NOTE — Telephone Encounter (Signed)
Refill was sent this morning  

## 2023-03-22 NOTE — Telephone Encounter (Signed)
MEDICATION:  Evaristo Bury FlexTouch insulin degludec (TRESIBA FLEXTOUCH) 100 UNIT/ML FlexTouch Pen  PHARMACY:    [facility] [address], [place] - [address] (Ph: [phone])    HAS THE PATIENT CONTACTED THEIR PHARMACY?  Yes  IS THIS A 90 DAY SUPPLY : Yes  IS PATIENT OUT OF MEDICATION: almost  IF NOT; HOW MUCH IS LEFT: Using last pen now  LAST APPOINTMENT DATE: /202024  NEXT APPOINTMENT DATE:@7 /08/2023  DO WE HAVE YOUR PERMISSION TO LEAVE A DETAILED MESSAGE?: Yes  OTHER COMMENTS: Patient states that pharmacy says she has no refills left.   **Let patient know to contact pharmacy at the end of the day to make sure medication is ready. **  ** Please notify patient to allow 48-72 hours to process**  **Encourage patient to contact the pharmacy for refills or they can request refills through [facility]**

## 2023-03-23 ENCOUNTER — Ambulatory Visit: Payer: No Typology Code available for payment source | Admitting: Podiatry

## 2023-04-12 ENCOUNTER — Ambulatory Visit (INDEPENDENT_AMBULATORY_CARE_PROVIDER_SITE_OTHER): Payer: No Typology Code available for payment source | Admitting: Podiatry

## 2023-04-12 DIAGNOSIS — E1142 Type 2 diabetes mellitus with diabetic polyneuropathy: Secondary | ICD-10-CM

## 2023-04-12 DIAGNOSIS — L84 Corns and callosities: Secondary | ICD-10-CM

## 2023-04-12 DIAGNOSIS — B351 Tinea unguium: Secondary | ICD-10-CM

## 2023-04-12 DIAGNOSIS — Z853 Personal history of malignant neoplasm of breast: Secondary | ICD-10-CM | POA: Insufficient documentation

## 2023-04-12 DIAGNOSIS — M79674 Pain in right toe(s): Secondary | ICD-10-CM | POA: Diagnosis not present

## 2023-04-12 DIAGNOSIS — M79675 Pain in left toe(s): Secondary | ICD-10-CM | POA: Diagnosis not present

## 2023-04-12 NOTE — Progress Notes (Unsigned)
  Subjective:  Patient ID: Lindsey Marquez, female    DOB: 06/20/1951,  MRN: [id number]  Lindsey Marquez presents to clinic today for at risk foot care with history of diabetic neuropathy and callus(es) left foot and right foot and painful thick toenails that are difficult to trim. Painful toenails interfere with ambulation. Aggravating factors include wearing enclosed shoe gear. Pain is relieved with periodic professional debridement. Painful calluses are aggravated when weightbearing with and without shoegear. Pain is relieved with periodic professional debridement.  Chief Complaint  Patient presents with   Nail Problem    DFC,Referring Provider Tower, Idamae Schuller A, MD,LOV:02/24,A1C:6.7,B/S:103      New problem(s): None.   PCP is Tower, Audrie Gallus, MD.  Allergies  Allergen Reactions   Codeine Other (See Comments)    "get crazy", "see things that aren't there"    Review of Systems: Negative except as noted in the HPI.  Objective: No changes noted in today's physical examination. There were no vitals filed for this visit. DAWNESHA Marquez is a pleasant 72 y.o. female obese in NAD. AAO x 3.  Neurovascular Examination: Neurovascular status unchanged bilaterally. Capillary refill time to digits immediate b/l. Palpable pedal pulses b/l LE. Pedal hair absent. No pain with calf compression b/l. Lower extremity skin temperature gradient within normal limits. No ischemia or gangrene noted b/l LE. No cyanosis or clubbing noted b/l LE.  Pt has subjective symptoms of neuropathy. Protective sensation intact 5/5 intact bilaterally with 10g monofilament b/l. Vibratory sensation intact b/l.  Dermatological:  Pedal skin is warm and supple b/l LE. No open wounds b/l LE. No interdigital macerations noted b/l LE. Toenails bilateral great toes, left 2nd toe and toes 3-5 b/l elongated, discolored, dystrophic, thickened, crumbly with subungual debris and tenderness to dorsal palpation.   Hyperkeratotic  lesion(s) submet head 1 b/l.  No erythema, no edema, no drainage, no fluctuance.   Musculoskeletal:  Muscle strength 5/5 to all lower extremity muscle groups bilaterally. HAV with bunion deformity noted b/l LE. Hammertoe deformity noted 1-5 b/l.  Assessment/Plan: 1. Pain due to onychomycosis of toenails of both feet   2. Callus   3. Diabetic peripheral neuropathy associated with type 2 diabetes mellitus (HCC)    -Consent given for treatment as described below: -Examined patient. -Discussed and educated patient on diabetic foot care, especially with  regards to the vascular, neurological and musculoskeletal systems. -Toenails 1-5 b/l were debrided in length and girth with sterile nail nippers and dremel without iatrogenic bleeding.  -Callus(es) submet head 1 left foot and submet head 1 right foot pared utilizing sharp debridement with sterile blade without complication or incident. Total number debrided =2. -Patient/POA to call should there be question/concern in the interim.   Return in about 3 months (around 07/13/2023).  Freddie Breech, DPM

## 2023-04-12 NOTE — Telephone Encounter (Signed)
error 

## 2023-04-14 ENCOUNTER — Encounter: Payer: Self-pay | Admitting: Podiatry

## 2023-04-19 ENCOUNTER — Other Ambulatory Visit: Payer: No Typology Code available for payment source

## 2023-04-21 ENCOUNTER — Telehealth: Payer: Self-pay | Admitting: Family Medicine

## 2023-04-21 DIAGNOSIS — D649 Anemia, unspecified: Secondary | ICD-10-CM

## 2023-04-21 NOTE — Telephone Encounter (Signed)
-----   Message from Alvina Chou sent at 04/19/2023  7:59 AM EDT ----- Regarding: Lab orders for Thursday, 5.23.24 Lab orders, thanks

## 2023-04-22 ENCOUNTER — Other Ambulatory Visit (INDEPENDENT_AMBULATORY_CARE_PROVIDER_SITE_OTHER): Payer: No Typology Code available for payment source

## 2023-04-22 DIAGNOSIS — D649 Anemia, unspecified: Secondary | ICD-10-CM

## 2023-04-22 LAB — CBC WITH DIFFERENTIAL/PLATELET
Basophils Absolute: 39 cells/uL (ref 0–200)
Eosinophils Relative: 2.5 %
HCT: 32.9 % — ABNORMAL LOW (ref 35.0–45.0)
Hemoglobin: 11 g/dL — ABNORMAL LOW (ref 11.7–15.5)
Neutro Abs: 5568 cells/uL (ref 1500–7800)
RBC: 3.38 10*6/uL — ABNORMAL LOW (ref 3.80–5.10)
RDW: 12.1 % (ref 11.0–15.0)
WBC: 9.7 10*3/uL (ref 3.8–10.8)

## 2023-04-22 NOTE — Addendum Note (Signed)
Addended by: Alvina Chou on: 04/22/2023 02:19 PM   Modules accepted: Orders

## 2023-04-23 ENCOUNTER — Other Ambulatory Visit: Payer: Self-pay | Admitting: Internal Medicine

## 2023-04-23 LAB — IRON: Iron: 77 ug/dL (ref 42–145)

## 2023-04-23 LAB — CBC WITH DIFFERENTIAL/PLATELET
Absolute Monocytes: 737 cells/uL (ref 200–950)
Basophils Relative: 0.4 %
Eosinophils Absolute: 243 cells/uL (ref 15–500)
Lymphs Abs: 3114 cells/uL (ref 850–3900)
MCH: 32.5 pg (ref 27.0–33.0)
MCHC: 33.4 g/dL (ref 32.0–36.0)
MCV: 97.3 fL (ref 80.0–100.0)
MPV: 8.8 fL (ref 7.5–12.5)
Monocytes Relative: 7.6 %
Neutrophils Relative %: 57.4 %
Platelets: 345 10*3/uL (ref 140–400)
Total Lymphocyte: 32.1 %

## 2023-04-23 LAB — VITAMIN B12: Vitamin B-12: 226 pg/mL (ref 211–911)

## 2023-04-23 LAB — PATHOLOGIST SMEAR REVIEW

## 2023-04-27 NOTE — Telephone Encounter (Signed)
Pt returning call would like a call back regarding lab results #[phone]

## 2023-04-30 ENCOUNTER — Telehealth: Payer: Self-pay | Admitting: Internal Medicine

## 2023-04-30 NOTE — Telephone Encounter (Signed)
Rx sent on 04/23/23

## 2023-04-30 NOTE — Telephone Encounter (Signed)
MEDICATION: OneTouch Verio glucose blood (ONETOUCH VERIO) test strip  PHARMACY:    [id number] - [place], [place] - [address] (Ph: [phone])    HAS THE PATIENT CONTACTED THEIR PHARMACY?  Yes  IS THIS A 90 DAY SUPPLY : Yes  IS PATIENT OUT OF MEDICATION: Yes  IF NOT; HOW MUCH IS LEFT:   LAST APPOINTMENT DATE: @3 /20/2024  NEXT APPOINTMENT DATE:@7 /08/2023  DO WE HAVE YOUR PERMISSION TO LEAVE A DETAILED MESSAGE?:  Yes  OTHER COMMENTS:    **Let patient know to contact pharmacy at the end of the day to make sure medication is ready. **  ** Please notify patient to allow 48-72 hours to process**  **Encourage patient to contact the pharmacy for refills or they can request refills through [facility]**

## 2023-05-05 ENCOUNTER — Telehealth: Payer: Self-pay | Admitting: Internal Medicine

## 2023-05-05 DIAGNOSIS — E1165 Type 2 diabetes mellitus with hyperglycemia: Secondary | ICD-10-CM

## 2023-05-05 MED ORDER — ONETOUCH VERIO VI STRP
ORAL_STRIP | 0 refills | Status: DC
Start: 2023-05-05 — End: 2023-06-22

## 2023-05-05 NOTE — Telephone Encounter (Signed)
patient advising that her pharmacy is stating that she dosnt have an Rx for her test strips and would like for someone to call in another Rx. in chart shows rx sent on 04/23/2023. please call patient.

## 2023-05-05 NOTE — Telephone Encounter (Signed)
Pt has been notified.

## 2023-05-21 DIAGNOSIS — C50912 Malignant neoplasm of unspecified site of left female breast: Secondary | ICD-10-CM | POA: Diagnosis not present

## 2023-06-08 ENCOUNTER — Ambulatory Visit: Payer: No Typology Code available for payment source | Admitting: Internal Medicine

## 2023-06-14 DIAGNOSIS — E119 Type 2 diabetes mellitus without complications: Secondary | ICD-10-CM | POA: Diagnosis not present

## 2023-06-14 DIAGNOSIS — H40013 Open angle with borderline findings, low risk, bilateral: Secondary | ICD-10-CM | POA: Diagnosis not present

## 2023-06-15 ENCOUNTER — Encounter: Payer: Self-pay | Admitting: Family Medicine

## 2023-06-15 DIAGNOSIS — C50912 Malignant neoplasm of unspecified site of left female breast: Secondary | ICD-10-CM | POA: Diagnosis not present

## 2023-06-15 LAB — HM DIABETES EYE EXAM

## 2023-06-21 ENCOUNTER — Other Ambulatory Visit: Payer: Self-pay | Admitting: Internal Medicine

## 2023-06-21 DIAGNOSIS — E1165 Type 2 diabetes mellitus with hyperglycemia: Secondary | ICD-10-CM

## 2023-06-25 ENCOUNTER — Other Ambulatory Visit (HOSPITAL_COMMUNITY): Payer: Self-pay

## 2023-06-30 ENCOUNTER — Encounter (INDEPENDENT_AMBULATORY_CARE_PROVIDER_SITE_OTHER): Payer: Self-pay

## 2023-07-07 ENCOUNTER — Other Ambulatory Visit: Payer: Self-pay | Admitting: Internal Medicine

## 2023-07-07 DIAGNOSIS — E1165 Type 2 diabetes mellitus with hyperglycemia: Secondary | ICD-10-CM

## 2023-07-12 ENCOUNTER — Other Ambulatory Visit (HOSPITAL_COMMUNITY): Payer: Self-pay

## 2023-07-14 ENCOUNTER — Ambulatory Visit: Payer: No Typology Code available for payment source

## 2023-07-14 VITALS — Ht 68.0 in | Wt 220.0 lb

## 2023-07-14 DIAGNOSIS — Z1231 Encounter for screening mammogram for malignant neoplasm of breast: Secondary | ICD-10-CM | POA: Diagnosis not present

## 2023-07-14 DIAGNOSIS — Z0000 Encounter for general adult medical examination without abnormal findings: Secondary | ICD-10-CM

## 2023-07-14 NOTE — Progress Notes (Signed)
Subjective:   Lindsey Marquez is a 72 y.o. female who presents for Medicare Annual (Subsequent) preventive examination.  Visit Complete: Virtual  I connected with  Lindsey Marquez on 07/14/23 by a audio enabled telemedicine application and verified that I am speaking with the correct person using two identifiers.  Patient Location: Home  Provider Location: Home Office  I discussed the limitations of evaluation and management by telemedicine. The patient expressed understanding and agreed to proceed.  Vital Signs: Unable to obtain new vitals due to this being a telehealth visit. Pt reported ht and wt.  Review of Systems      Cardiac Risk Factors include: advanced age (>38men, >28 women);obesity (BMI >30kg/m2);sedentary lifestyle;hypertension;diabetes mellitus;dyslipidemia     Objective:    Today's Vitals   07/14/23 1433  Weight: 220 lb (99.8 kg)  Height: 5\' 8"  (1.727 m)   Body mass index is 33.45 kg/m.     07/14/2023    2:39 PM 04/16/2022    1:41 PM 02/17/2018    1:34 PM 02/10/2018   10:07 AM 02/02/2018   11:17 AM 10/11/2017    9:58 AM 08/19/2017    6:34 AM  Advanced Directives  Does Patient Have a Medical Advance Directive? No No No No No No No  Would patient like information on creating a medical advance directive? No - Patient declined Yes (MAU/Ambulatory/Procedural Areas - Information given) No - Patient declined   No - Patient declined No - Patient declined    Current Medications (verified) Outpatient Encounter Medications as of 07/14/2023  Medication Sig   aspirin 81 MG tablet Take 81 mg by mouth daily as needed (leg pain).   atorvastatin (LIPITOR) 20 MG tablet Take 1 tablet (20 mg total) by mouth daily.   Blood Glucose Monitoring Suppl (ONETOUCH VERIO) w/Device KIT Use to check blood sugar 3 times a day. E11.9   dapagliflozin propanediol (FARXIGA) 10 MG TABS tablet Take 1 tablet (10 mg total) by mouth daily.   gabapentin (NEURONTIN) 300 MG capsule Take 2 capsules  (600 mg total) by mouth at bedtime.   ibuprofen (ADVIL) 800 MG tablet Take 800 mg by mouth every 6 (six) hours as needed.   Insulin Pen Needle (BD PEN NEEDLE NANO 2ND GEN) 32G X 4 MM MISC USE 1  TWICE DAILY   letrozole (FEMARA) 2.5 MG tablet Take 1 tablet (2.5 mg total) by mouth daily.   lisinopril-hydrochlorothiazide (ZESTORETIC) 20-25 MG tablet Take 1 tablet by mouth daily.   metFORMIN (GLUCOPHAGE) 1000 MG tablet Take 1 tablet (1,000 mg total) by mouth 2 (two) times daily with a meal.   Omega-3 Fatty Acids (FISH OIL) 1200 MG CAPS Take 1,200 mg by mouth daily.    ONETOUCH VERIO test strip USE AS DIRECTED   TRESIBA FLEXTOUCH 100 UNIT/ML FlexTouch Pen INJECT 30 UNITS SUBCUTANEOUSLY ONCE DAILY   TRUEPLUS LANCETS 33G MISC 1 each by Does not apply route 3 (three) times daily.   NON FORMULARY Peripheral Neuropathy cream from [facility] (Patient not taking: Reported on 07/14/2023)   No facility-administered encounter medications on file as of 07/14/2023.    Allergies (verified) Codeine   History: Past Medical History:  Diagnosis Date   Blood transfusion without reported diagnosis    Breast cancer (HCC) 06/2001   Invasive ductal carcinoma Left breast. 2002, 2017   Diabetes mellitus without complication (HCC)    Difficult intravenous access    Family history of malignant neoplasm of ovary    Family history of pancreatic cancer  Hypertension    Past Surgical History:  Procedure Laterality Date   BREAST SURGERY Left 2003   COLONOSCOPY     FOOT SURGERY  2000   INSERTION OF MESH N/A 02/17/2018   Procedure: INSERTION OF MESH;  Surgeon: Manus Rudd, MD;  Location: [facility] OR;  Service: General;  Laterality: N/A;   MASTECTOMY Left 05/28/2016   port a cath insertion  05/28/2016   PORT-A-CATH REMOVAL Right 08/19/2017   Procedure: REMOVAL PORT-A-CATH;  Surgeon: Manus Rudd, MD;  Location: [facility];  Service: General;  Laterality: Right;   PORTACATH PLACEMENT Right  05/28/2016   Procedure: INSERTION PORT-A-CATH;  Surgeon: Manus Rudd, MD;  Location: [facility] OR;  Service: General;  Laterality: Right;   TOTAL MASTECTOMY Left 05/28/2016   Procedure: LEFT  MASTECTOMY;  Surgeon: Manus Rudd, MD;  Location: [facility] OR;  Service: General;  Laterality: Left;   TUBAL LIGATION  22 yrs since  2004.   Bilateral.   VENTRAL HERNIA REPAIR  02/17/2018   open   VENTRAL HERNIA REPAIR N/A 02/17/2018   Procedure: OPEN VENTRAL HERNIA REPAIR WITH MESH;  Surgeon: Manus Rudd, MD;  Location: [facility] OR;  Service: General;  Laterality: N/A;   Family History  Problem Relation Age of Onset   Pancreatic cancer Mother 60       deceased   Prostate cancer Father    Colon polyps Sister    Ovarian cancer Maternal Aunt 94       deceased   Cancer Maternal Aunt        2 other mat aunts; unk. primary in 35s; deceased   Cancer Maternal Uncle        "bone ca"; unk. primary; deceased 47s   Prostate cancer Maternal Uncle        deceased 18   Colon cancer Neg Hx    Esophageal cancer Neg Hx    Rectal cancer Neg Hx    Stomach cancer Neg Hx    Social History   Socioeconomic History   Marital status: Divorced    Spouse name: Not on file   Number of children: Not on file   Years of education: Not on file   Highest education level: Not on file  Occupational History   Not on file  Tobacco Use   Smoking status: Former    Current packs/day: 0.00    Average packs/day: 0.1 packs/day for 7.0 years (0.7 ttl pk-yrs)    Types: Cigarettes    Start date: 11/30/1968    Quit date: 12/01/1975    Years since quitting: 47.6   Smokeless tobacco: Never  Vaping Use   Vaping status: Never Used  Substance and Sexual Activity   Alcohol use: No    Alcohol/week: 0.0 standard drinks of alcohol   Drug use: No   Sexual activity: Yes  Other Topics Concern   Not on file  Social History Narrative   Not on file   Social Determinants of Health   Financial Resource Strain: Low Risk  (07/14/2023)   Overall  Financial Resource Strain (CARDIA)    Difficulty of Paying Living Expenses: Not hard at all  Food Insecurity: No Food Insecurity (07/14/2023)   Hunger Vital Sign    Worried About Running Out of Food in the Last Year: Never true    Ran Out of Food in the Last Year: Never true  Transportation Needs: No Transportation Needs (07/14/2023)   PRAPARE - Transportation    Lack of Transportation (Medical): No    Lack  of Transportation (Non-Medical): No  Physical Activity: Insufficiently Active (07/14/2023)   Exercise Vital Sign    Days of Exercise per Week: 3 days    Minutes of Exercise per Session: 20 min  Stress: No Stress Concern Present (07/14/2023)   [employer] of Occupational Health - Occupational Stress Questionnaire    Feeling of Stress : Not at all  Social Connections: Moderately Integrated (07/14/2023)   Social Connection and Isolation Panel [NHANES]    Frequency of Communication with Friends and Family: More than three times a week    Frequency of Social Gatherings with Friends and Family: More than three times a week    Attends Religious Services: More than 4 times per year    Active Member of [employer] or Organizations: Yes    Attends Engineer, structural: More than 4 times per year    Marital Status: Divorced    Tobacco Counseling Counseling given: Not Answered   Clinical Intake:  Pre-visit preparation completed: Yes  Pain : No/denies pain     BMI - recorded: 33.45 Nutritional Status: BMI > 30  Obese Nutritional Risks: None Diabetes: Yes CBG done?: Yes (104 per pt) CBG resulted in Enter/ Edit results?: No Did pt. bring in CBG monitor from home?: No  How often do you need to have someone help you when you read instructions, pamphlets, or other written materials from your doctor or pharmacy?: 1 - Never  Interpreter Needed?: No  Information entered by :: C. LPN   Activities of Daily Living    07/14/2023    2:40 PM  In your present state of  health, do you have any difficulty performing the following activities:  Hearing? 0  Vision? 0  Difficulty concentrating or making decisions? 0  Walking or climbing stairs? 0  Dressing or bathing? 0  Doing errands, shopping? 0  Preparing Food and eating ? N  Using the Toilet? N  In the past six months, have you accidently leaked urine? N  Do you have problems with loss of bowel control? N  Managing your Medications? N  Managing your Finances? N  Housekeeping or managing your Housekeeping? N    Patient Care Team: Tower, Audrie Gallus, MD as PCP - General (Family Medicine) Freddie Breech, DPM as Consulting Physician (Podiatry) Carlus Pavlov, MD as Consulting Physician (Endocrinology) Pollyann Samples, NP as Nurse Practitioner (Hematology and Oncology)  Indicate any recent Medical Services you may have received from other than [facility] providers in the past year (date may be approximate).     Assessment:   This is a routine wellness examination for Jeannett.  Hearing/Vision screen Hearing Screening - Comments:: Denies hearing difficulties   Vision Screening - Comments:: Glasses - [facility] (unknown provider) - UTD on eye exams  Dietary issues and exercise activities discussed:     Goals Addressed             This Visit's Progress    Patient Stated       Exercise 3-4 times a week for 30 minutes.       Depression Screen    07/14/2023    2:35 PM 04/16/2022    1:39 PM 12/25/2021   12:09 PM 04/15/2020   11:00 AM 03/12/2020   11:40 AM 12/20/2018   12:01 PM 01/04/2018    1:24 PM  PHQ 2/9 Scores  PHQ - 2 Score 0 0 0 0 0 0 0  PHQ- 9 Score      1  Fall Risk    07/14/2023    2:40 PM 04/16/2022    1:43 PM 07/17/2021    9:09 AM 04/15/2020   11:00 AM 12/20/2018   12:01 PM  Fall Risk   Falls in the past year? 0 0 1 0 0  Number falls in past yr: 0 0 0    Injury with Fall? 0 0 1    Risk for fall due to : No Fall Risks No Fall Risks     Follow up Falls prevention  discussed;Falls evaluation completed Falls prevention discussed       MEDICARE RISK AT HOME:  Medicare Risk at Home - 07/14/23 1441     Any stairs in or around the home? Yes    If so, are there any without handrails? No    Home free of loose throw rugs in walkways, pet beds, electrical cords, etc? Yes    Adequate lighting in your home to reduce risk of falls? Yes    Life alert? Yes   has but don't wear   Use of a cane, walker or w/c? No    Grab bars in the bathroom? No    Shower chair or bench in shower? No    Elevated toilet seat or a handicapped toilet? No             TIMED UP AND GO:  Was the test performed?  No    Cognitive Function:        07/14/2023    2:42 PM  6CIT Screen  What Year? 0 points  What month? 0 points  What time? 0 points  Count back from 20 0 points  Months in reverse 4 points  Repeat phrase 4 points  Total Score 8 points    Immunizations Immunization History  Administered Date(s) Administered   Fluad Quad(high Dose 65+) 08/03/2019, 08/29/2020, 08/14/2022   Influenza, High Dose Seasonal PF 09/14/2017, 09/03/2021   Influenza,inj,Quad PF,6+ Mos 09/07/2016, 09/29/2018   Influenza-Unspecified 10/31/2015   PFIZER(Purple Top)SARS-COV-2 Vaccination 01/25/2020, 02/20/2020, 10/09/2020   Pneumococcal Conjugate-13 10/04/2017   Pneumococcal Polysaccharide-23 01/27/2016   Td 12/26/2021   Tdap 09/08/2012   Zoster Recombinant(Shingrix) 04/24/2020, 07/10/2020   Zoster, Live 09/08/2012    TDAP status: Up to date  Flu Vaccine status: Due, Education has been provided regarding the importance of this vaccine. Advised may receive this vaccine at local pharmacy or Health Dept. Aware to provide a copy of the vaccination record if obtained from local pharmacy or Health Dept. Verbalized acceptance and understanding.  Pneumococcal vaccine status: Up to date  Covid-19 vaccine status: Information provided on how to obtain vaccines.   Qualifies for Shingles  Vaccine? Yes   Zostavax completed Yes   Shingrix Completed?: Yes  Screening Tests Health Maintenance  Topic Date Due   COVID-19 Vaccine (4 - 2023-24 season) 07/31/2022   INFLUENZA VACCINE  07/01/2023   Diabetic kidney evaluation - Urine ACR  01/28/2028 (Originally 05/27/1969)   HEMOGLOBIN A1C  08/20/2023   Diabetic kidney evaluation - eGFR measurement  01/28/2024   FOOT EXAM  04/11/2024   OPHTHALMOLOGY EXAM  06/14/2024   Medicare Annual Wellness (AWV)  07/13/2024   MAMMOGRAM  08/19/2024   Colonoscopy  09/23/2025   DTaP/Tdap/Td (3 - Td or Tdap) 12/27/2031   DEXA SCAN  Completed   Hepatitis C Screening  Completed   Zoster Vaccines- Shingrix  Completed   HPV VACCINES  Aged Out   Pneumonia Vaccine 9+ Years old  Discontinued    Health  Maintenance  Health Maintenance Due  Topic Date Due   COVID-19 Vaccine (4 - 2023-24 season) 07/31/2022   INFLUENZA VACCINE  07/01/2023    Colorectal cancer screening: Type of screening: Colonoscopy. Completed 09/23/22. Repeat every 3 years  Mammogram status: Ordered 07/14/23. Pt provided with contact info and advised to call to schedule appt.     Lung Cancer Screening: (Low Dose CT Chest recommended if Age 54-80 years, 20 pack-year currently smoking OR have quit w/in 15years.) does not qualify.   Lung Cancer Screening Referral:    Additional Screening:  Hepatitis C Screening: does qualify; Completed 10/04/17  Vision Screening: Recommended annual ophthalmology exams for early detection of glaucoma and other disorders of the eye. Is the patient up to date with their annual eye exam?  Yes  Who is the provider or what is the name of the office in which the patient attends annual eye exams? [facility] If pt is not established with a provider, would they like to be referred to a provider to establish care? Yes .   Dental Screening: Recommended annual dental exams for proper oral hygiene  Diabetic Foot Exam: Diabetic Foot Exam: Completed  04/12/23  Community Resource Referral / Chronic Care Management: CRR required this visit?  No   CCM required this visit?  No     Plan:     I have personally reviewed and noted the following in the patient's chart:   Medical and social history Use of alcohol, tobacco or illicit drugs  Current medications and supplements including opioid prescriptions. Patient is not currently taking opioid prescriptions. Functional ability and status Nutritional status Physical activity Advanced directives List of other physicians Hospitalizations, surgeries, and ER visits in previous 12 months Vitals Screenings to include cognitive, depression, and falls Referrals and appointments  In addition, I have reviewed and discussed with patient certain preventive protocols, quality metrics, and best practice recommendations. A written personalized care plan for preventive services as well as general preventive health recommendations were provided to patient.     Maryan Puls, LPN   [id number]/61/0960   After Visit Summary: (MyChart) Due to this being a telephonic visit, the after visit summary with patients personalized plan was offered to patient via MyChart   Nurse Notes: none

## 2023-07-14 NOTE — Patient Instructions (Addendum)
Lindsey Marquez , Thank you for taking time to come for your Medicare Wellness Visit. I appreciate your ongoing commitment to your health goals. Please review the following plan we discussed and let me know if I can assist you in the future.   Referrals/Orders/Follow-Ups/Clinician Recommendations: Aim for 30 minutes of exercise or brisk walking, 6-8 glasses of water, and 5 servings of fruits and vegetables each day.   You have an order for:  []   2D Mammogram  [x]   3D Mammogram  []   Bone Density     Please call for appointment:  [facility] [address] [place], [place] [address] [phone]  [facility] [address] [place], [place] [address] [phone]  [facility] [place] [address] [place], [place] [address] [phone]  [facility] [place] [facility] [address] [place], [place] [address] [phone]  [facility] [address] [place], [place] [address] [phone]    Make sure to wear two-piece clothing.  No lotions, powders, or deodorants the day of the appointment. Make sure to bring picture ID and insurance card.  Bring list of medications you are currently taking including any supplements.   Schedule your [place] screening mammogram through MyChart!   Log into your MyChart account.  Go to 'Visit' (or 'Appointments' if on mobile App) --> Schedule an Appointment  Under 'Select a Reason for Visit' choose the Mammogram Screening option.  Complete the pre-visit questions and select the time and place that best fits your schedule.    This is a list of the screening recommended for you and due dates:  Health Maintenance  Topic Date Due   COVID-19 Vaccine (4 - 2023-24 season) 07/31/2022   Medicare Annual Wellness Visit  04/17/2023   Flu Shot  07/01/2023   Yearly kidney health urinalysis for diabetes  01/28/2028*   Hemoglobin A1C   08/20/2023   Yearly kidney function blood test for diabetes  01/28/2024   Complete foot exam   04/11/2024   Eye exam for diabetics  06/14/2024   Mammogram  08/19/2024   Colon Cancer Screening  09/23/2025   DTaP/Tdap/Td vaccine (3 - Td or Tdap) 12/27/2031   DEXA scan (bone density measurement)  Completed   Hepatitis C Screening  Completed   Zoster (Shingles) Vaccine  Completed   HPV Vaccine  Aged Out   Pneumonia Vaccine  Discontinued  *Topic was postponed. The date shown is not the original due date.    Advanced directives: (Copy Requested) Please bring a copy of your health care power of attorney and living will to the office to be added to your chart at your convenience.  Next Medicare Annual Wellness Visit scheduled for next year: Yes  Preventive Care 54 Years and Older, Female Preventive care refers to lifestyle choices and visits with your health care provider that can promote health and wellness. What does preventive care include? A yearly physical exam. This is also called an annual well check. Dental exams once or twice a year. Routine eye exams. Ask your health care provider how often you should have your eyes checked. Personal lifestyle choices, including: Daily care of your teeth and gums. Regular physical activity. Eating a healthy diet. Avoiding tobacco and drug use. Limiting alcohol use. Practicing safe sex. Taking low-dose aspirin every day. Taking vitamin and mineral supplements as recommended by your health care provider. What happens during an annual well check? The  services and screenings done by your health care provider during your annual well check will depend on your age, overall health, lifestyle risk factors, and family history of disease. Counseling  Your health care provider may ask you questions about your: Alcohol use. Tobacco use. Drug use. Emotional well-being. Home and relationship well-being. Sexual activity. Eating habits. History of  falls. Memory and ability to understand (cognition). Work and work Astronomer. Reproductive health. Screening  You may have the following tests or measurements: Height, weight, and BMI. Blood pressure. Lipid and cholesterol levels. These may be checked every 5 years, or more frequently if you are over 21 years old. Skin check. Lung cancer screening. You may have this screening every year starting at age 32 if you have a 30-pack-year history of smoking and currently smoke or have quit within the past 15 years. Fecal occult blood test (FOBT) of the stool. You may have this test every year starting at age 58. Flexible sigmoidoscopy or colonoscopy. You may have a sigmoidoscopy every 5 years or a colonoscopy every 10 years starting at age 75. Hepatitis C blood test. Hepatitis B blood test. Sexually transmitted disease (STD) testing. Diabetes screening. This is done by checking your blood sugar (glucose) after you have not eaten for a while (fasting). You may have this done every 1-3 years. Bone density scan. This is done to screen for osteoporosis. You may have this done starting at age 55. Mammogram. This may be done every 1-2 years. Talk to your health care provider about how often you should have regular mammograms. Talk with your health care provider about your test results, treatment options, and if necessary, the need for more tests. Vaccines  Your health care provider may recommend certain vaccines, such as: Influenza vaccine. This is recommended every year. Tetanus, diphtheria, and acellular pertussis (Tdap, Td) vaccine. You may need a Td booster every 10 years. Zoster vaccine. You may need this after age 61. Pneumococcal 13-valent conjugate (PCV13) vaccine. One dose is recommended after age 49. Pneumococcal polysaccharide (PPSV23) vaccine. One dose is recommended after age 19. Talk to your health care provider about which screenings and vaccines you need and how often you need  them. This information is not intended to replace advice given to you by your health care provider. Make sure you discuss any questions you have with your health care provider. Document Released: 12/13/2015 Document Revised: 08/05/2016 Document Reviewed: 09/17/2015 Elsevier Interactive Patient Education  2017 [employer].  Fall Prevention in the Home Falls can cause injuries. They can happen to people of all ages. There are many things you can do to make your home safe and to help prevent falls. What can I do on the outside of my home? Regularly fix the edges of walkways and driveways and fix any cracks. Remove anything that might make you trip as you walk through a door, such as a raised step or threshold. Trim any bushes or trees on the path to your home. Use bright outdoor lighting. Clear any walking paths of anything that might make someone trip, such as rocks or tools. Regularly check to see if handrails are loose or broken. Make sure that both sides of any steps have handrails. Any raised decks and porches should have guardrails on the edges. Have any leaves, snow, or ice cleared regularly. Use sand or salt on walking paths during winter. Clean up any spills in your garage right away. This includes oil or grease spills. What can I do in the bathroom?  Use night lights. Install grab bars by the toilet and in the tub and shower. Do not use towel bars as grab bars. Use non-skid mats or decals in the tub or shower. If you need to sit down in the shower, use a plastic, non-slip stool. Keep the floor dry. Clean up any water that spills on the floor as soon as it happens. Remove soap buildup in the tub or shower regularly. Attach bath mats securely with double-sided non-slip rug tape. Do not have throw rugs and other things on the floor that can make you trip. What can I do in the bedroom? Use night lights. Make sure that you have a light by your bed that is easy to reach. Do not use  any sheets or blankets that are too big for your bed. They should not hang down onto the floor. Have a firm chair that has side arms. You can use this for support while you get dressed. Do not have throw rugs and other things on the floor that can make you trip. What can I do in the kitchen? Clean up any spills right away. Avoid walking on wet floors. Keep items that you use a lot in easy-to-reach places. If you need to reach something above you, use a strong step stool that has a grab bar. Keep electrical cords out of the way. Do not use floor polish or wax that makes floors slippery. If you must use wax, use non-skid floor wax. Do not have throw rugs and other things on the floor that can make you trip. What can I do with my stairs? Do not leave any items on the stairs. Make sure that there are handrails on both sides of the stairs and use them. Fix handrails that are broken or loose. Make sure that handrails are as long as the stairways. Check any carpeting to make sure that it is firmly attached to the stairs. Fix any carpet that is loose or worn. Avoid having throw rugs at the top or bottom of the stairs. If you do have throw rugs, attach them to the floor with carpet tape. Make sure that you have a light switch at the top of the stairs and the bottom of the stairs. If you do not have them, ask someone to add them for you. What else can I do to help prevent falls? Wear shoes that: Do not have high heels. Have rubber bottoms. Are comfortable and fit you well. Are closed at the toe. Do not wear sandals. If you use a stepladder: Make sure that it is fully opened. Do not climb a closed stepladder. Make sure that both sides of the stepladder are locked into place. Ask someone to hold it for you, if possible. Clearly mark and make sure that you can see: Any grab bars or handrails. First and last steps. Where the edge of each step is. Use tools that help you move around (mobility aids)  if they are needed. These include: Canes. Walkers. Scooters. Crutches. Turn on the lights when you go into a dark area. Replace any light bulbs as soon as they burn out. Set up your furniture so you have a clear path. Avoid moving your furniture around. If any of your floors are uneven, fix them. If there are any pets around you, be aware of where they are. Review your medicines with your doctor. Some medicines can make you feel dizzy. This can increase your chance of falling. Ask your doctor what other  things that you can do to help prevent falls. This information is not intended to replace advice given to you by your health care provider. Make sure you discuss any questions you have with your health care provider. Document Released: 09/12/2009 Document Revised: 04/23/2016 Document Reviewed: 12/21/2014 Elsevier Interactive Patient Education  2017 [employer].

## 2023-07-15 ENCOUNTER — Ambulatory Visit: Payer: No Typology Code available for payment source | Admitting: Podiatry

## 2023-07-15 DIAGNOSIS — Z91199 Patient's noncompliance with other medical treatment and regimen due to unspecified reason: Secondary | ICD-10-CM

## 2023-07-15 NOTE — Progress Notes (Signed)
1. No-show for appointment     

## 2023-07-30 ENCOUNTER — Other Ambulatory Visit: Payer: Self-pay | Admitting: Hematology

## 2023-07-30 DIAGNOSIS — C50112 Malignant neoplasm of central portion of left female breast: Secondary | ICD-10-CM

## 2023-08-03 ENCOUNTER — Other Ambulatory Visit: Payer: Self-pay

## 2023-08-03 DIAGNOSIS — C50112 Malignant neoplasm of central portion of left female breast: Secondary | ICD-10-CM

## 2023-08-04 ENCOUNTER — Inpatient Hospital Stay (HOSPITAL_BASED_OUTPATIENT_CLINIC_OR_DEPARTMENT_OTHER): Payer: No Typology Code available for payment source | Admitting: Hematology

## 2023-08-04 ENCOUNTER — Inpatient Hospital Stay: Payer: No Typology Code available for payment source | Attending: Nurse Practitioner

## 2023-08-04 ENCOUNTER — Encounter: Payer: Self-pay | Admitting: Hematology

## 2023-08-04 VITALS — BP 141/60 | HR 106 | Temp 98.2°F | Resp 18 | Ht 68.0 in | Wt 222.1 lb

## 2023-08-04 DIAGNOSIS — Z9012 Acquired absence of left breast and nipple: Secondary | ICD-10-CM | POA: Insufficient documentation

## 2023-08-04 DIAGNOSIS — C50112 Malignant neoplasm of central portion of left female breast: Secondary | ICD-10-CM | POA: Insufficient documentation

## 2023-08-04 DIAGNOSIS — Z170 Estrogen receptor positive status [ER+]: Secondary | ICD-10-CM | POA: Insufficient documentation

## 2023-08-04 DIAGNOSIS — D649 Anemia, unspecified: Secondary | ICD-10-CM | POA: Insufficient documentation

## 2023-08-04 DIAGNOSIS — Z79818 Long term (current) use of other agents affecting estrogen receptors and estrogen levels: Secondary | ICD-10-CM | POA: Insufficient documentation

## 2023-08-04 DIAGNOSIS — Z79811 Long term (current) use of aromatase inhibitors: Secondary | ICD-10-CM | POA: Insufficient documentation

## 2023-08-04 DIAGNOSIS — Z9221 Personal history of antineoplastic chemotherapy: Secondary | ICD-10-CM | POA: Insufficient documentation

## 2023-08-04 LAB — CBC WITH DIFFERENTIAL (CANCER CENTER ONLY)
Abs Immature Granulocytes: 0.02 10*3/uL (ref 0.00–0.07)
Basophils Absolute: 0 10*3/uL (ref 0.0–0.1)
Basophils Relative: 0 %
Eosinophils Absolute: 0.2 10*3/uL (ref 0.0–0.5)
Eosinophils Relative: 3 %
HCT: 35 % — ABNORMAL LOW (ref 36.0–46.0)
Hemoglobin: 12 g/dL (ref 12.0–15.0)
Immature Granulocytes: 0 %
Lymphocytes Relative: 26 %
Lymphs Abs: 2 10*3/uL (ref 0.7–4.0)
MCH: 34.4 pg — ABNORMAL HIGH (ref 26.0–34.0)
MCHC: 34.3 g/dL (ref 30.0–36.0)
MCV: 100.3 fL — ABNORMAL HIGH (ref 80.0–100.0)
Monocytes Absolute: 0.5 10*3/uL (ref 0.1–1.0)
Monocytes Relative: 6 %
Neutro Abs: 5 10*3/uL (ref 1.7–7.7)
Neutrophils Relative %: 65 %
Platelet Count: 336 10*3/uL (ref 150–400)
RBC: 3.49 MIL/uL — ABNORMAL LOW (ref 3.87–5.11)
RDW: 12.4 % (ref 11.5–15.5)
WBC Count: 7.8 10*3/uL (ref 4.0–10.5)
nRBC: 0 % (ref 0.0–0.2)

## 2023-08-04 LAB — CMP (CANCER CENTER ONLY)
ALT: 16 U/L (ref 0–44)
AST: 17 U/L (ref 15–41)
Albumin: 4.4 g/dL (ref 3.5–5.0)
Alkaline Phosphatase: 72 U/L (ref 38–126)
Anion gap: 7 (ref 5–15)
BUN: 28 mg/dL — ABNORMAL HIGH (ref 8–23)
CO2: 28 mmol/L (ref 22–32)
Calcium: 9.4 mg/dL (ref 8.9–10.3)
Chloride: 106 mmol/L (ref 98–111)
Creatinine: 1.22 mg/dL — ABNORMAL HIGH (ref 0.44–1.00)
GFR, Estimated: 47 mL/min — ABNORMAL LOW (ref >60)
Glucose, Bld: 141 mg/dL — ABNORMAL HIGH (ref 70–99)
Potassium: 4.5 mmol/L (ref 3.5–5.1)
Sodium: 141 mmol/L (ref 135–145)
Total Bilirubin: 0.4 mg/dL (ref 0.3–1.2)
Total Protein: 7.7 g/dL (ref 6.5–8.1)

## 2023-08-04 NOTE — Assessment & Plan Note (Signed)
pT2NxM0, stage IIA, G3, triple positive -She initially had left breast cancer in 2003. She was treated with Neo-adjuvant chemo, left breast lumpectomy, adjuvant chemo and Radiation.  -She had another left breast cancer in 03/2016. She was treated with left mastectomy, adjuvant chemo TCHP and Nerlynx.  -She started letrozole in 11/2016 for a goal of 10 years. Tolerating well with mild lower extremity joint pain and occasional hot flashes.   -Lindsey Marquez is clinically doing well.  Breast exam is benign, labs are stable. No clinical concern for recurrence.

## 2023-08-04 NOTE — Progress Notes (Signed)
[facility]   Telephone:([phone] Fax:([phone]) [phone]   Clinic Follow up Note   Patient Care Team: Tower, Audrie Gallus, MD as PCP - General (Family Medicine) Freddie Breech, DPM as Consulting Physician (Podiatry) Carlus Pavlov, MD as Consulting Physician (Endocrinology) Pollyann Samples, NP as Nurse Practitioner (Hematology and Oncology)  Date of Service:  08/04/2023  CHIEF COMPLAINT: f/u of left breast cancer   CURRENT THERAPY:  Letrozole 2.5 mg daily, started 12/15/2016    ASSESSMENT:  Lindsey Marquez is a 72 y.o. 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 She was treated with Neo-adjuvant chemo, left breast lumpectomy, adjuvant chemo and Radiation.  -She had another left breast cancer in 03/2016. She was treated with left mastectomy, adjuvant chemo TCHP and Nerlynx.  -She started letrozole in 11/2016 for a goal of 10 years. Tolerating well with mild lower extremity joint pain and occasional hot flashes.   -Ms. Brosman is clinically doing well.  Breast exam is benign, labs are stable. No clinical concern for recurrence.   Bone heath  -Her bone density has been normal, last bone density scan was done in September 2023 -We discussed the impact of letrozole on her bone density, and I encouraged her to take vitamin D, and ask societies regularly.  Mild anemia -Overall stable, likely related to her chronic medical issues.  She does have a mildly low B12 leve in May 2024, for which she is on oral B12.   PLAN: -lab reviewed -CMP - pending - continue Letrozole -lab and f/u in 1 year with NP Lacie     SUMMARY OF ONCOLOGIC HISTORY: Oncology History Overview Note  Cancer of central portion of left breast [facility])   Staging form: Breast, AJCC 7th Edition     Clinical stage from 04/17/2016: Stage IA (T1c, N0, M0) - Signed by Malachy Mood, MD on 05/07/2016      Pathologic stage from 05/28/2016: Stage IIA (T2, N0, cM0) - Signed by Malachy Mood, MD on 06/11/2016 History of left breast cancer   Staging form: Breast, AJCC 7th Edition     Clinical: Stage IIIB (T4d, N1, cM0) - Signed by Artis Delay, MD on 12/14/2013     Pathologic: Stage IIIB (T4d, N1a, cM0) - Signed by Artis Delay, MD on 12/14/2013     History of left breast cancer (Resolved)  09/12/2002 Imaging   CT scan show no evidence of disease apart from the left axilla   09/2002 -  Neo-Adjuvant Chemotherapy   TAC X4 cycles    01/10/2003 Surgery   The patient had left lumpectomy and axillary lymph node dissection which show residual breast cancer and a sentinel lymph node was positive   2004 -  Adjuvant Chemotherapy   Cytoxan with 5-FU x4 cycles (methotrexate added at cycle 4).   2004 -  Radiation Therapy   Adjuvant left breast radiation   12/2002 Receptors her2   ER, PR and HER-2 were all negative.   Cancer of central portion of left breast (HCC)  04/13/2016 Mammogram   Scheduler coarse heterogeneous calcifications spanning an area of 3.7 cm in the lumpectomy site, indeterminate. Ultrasound of the left axilla was negative.   04/17/2016 Initial Diagnosis   Cancer of central portion of left breast (HCC)   04/17/2016 Initial Biopsy   Left breast core needle biopsy showed invasive and in situ ductal carcinoma with calcification, grade 2.  04/17/2016 Receptors her2   ER 100% positive, PR 15% positive, strong staining, Ki-67 10%, HER-2 positive by IHC (3)   05/05/2016 Imaging   Bilateral breast MRI showed a 1.3 x 0.8 x 0.7 cm biopsy-proven ductal carcinoma in the region of the previous lumpectomy. Enlarged lobulated right inferior axillary lymph node with diffuse cortical thickening, biopsy is recommended.   05/15/2016 Pathology Results   right axilary node biopsy was negative for malignant cell    05/28/2016 Surgery   Simple left mastectomy   05/28/2016 Pathology Results   Left mastectomy showed  invasive ductal carcinoma, grade 3, 3.6 cm, high grade DCIS, (+) LVI, surgical margins were negative. Tumor 3.6 cm, no lymph nodes identified.   06/25/2016 Imaging   Staging CT scan of the chest, abdomen and pelvis with contrast and a bone scan showed no evidence of metastasis. Postoperative fluid collection in the left breast, likely a seroma or hematoma. Right axillary adenopathy, which was biopsied previously.    07/07/2016 - 07/15/2017 Chemotherapy   Adjuvant chemotherapy with docetaxel, carboplatin, Herceptin and pejeta every 3 weeks, for 6 cycles, followed by Herceptin maintenance therapy for total of one year treatment  Ended [facility] 11/04/16     12/15/2016 -  Anti-estrogen oral therapy   Adjuvant letrozole 2.5 mg once daily   04/29/2017 Mammogram    Mammogram 04/29/2017 IMPRESSION: No mammographic evidence of malignancy. A result letter of this screening mammogram will be mailed directly to the patient.    08/26/2017 - 07/2018 Antibody Plan   She started Nerlynx 240mg  on 08/26/17, was held on 09/14/17 due to significant diarrhea and restarted when her diarrhea resolved on 09/21/17. Completed in 07/2018   09/22/2017 Procedure   Colonoscopy by Dr. Lavon Paganini  IMPRESSION: - One 3 mm polyp in the transverse colon, removed with a cold biopsy forceps. Resected and retrieved. - Non-bleeding internal hemorrhoids.   09/22/2017 Pathology Results   Diagnosis, colonoscopy  Surgical [P], transverse, polyp - TUBULAR ADENOMA(S). - HIGH GRADE DYSPLASIA IS NOT IDENTIFIED.      INTERVAL HISTORY:  Lindsey Marquez is here for a follow up of left breast cancer. She was last seen by me on 08/06/2022. She presents to the clinic alone. Pt state that she is doing well. She denies having any issues with Letrozole. 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    All other systems were reviewed with the patient and are negative.  MEDICAL HISTORY:  Past Medical History:  Diagnosis Date   Blood  transfusion without reported diagnosis    Breast cancer (HCC) 06/2001   Invasive ductal carcinoma Left breast. 2002, 2017   Diabetes mellitus without complication (HCC)    Difficult intravenous access    Family history of malignant neoplasm of ovary    Family history of pancreatic cancer    Hypertension     SURGICAL HISTORY: Past Surgical History:  Procedure Laterality Date   BREAST SURGERY Left 2003   COLONOSCOPY     FOOT SURGERY  2000   INSERTION OF MESH N/A 02/17/2018   Procedure: INSERTION OF MESH;  Surgeon: Manus Rudd, MD;  Location[facility] OR;  Service: General;  Laterality: N/A;   MASTECTOMY Left 05/28/2016   port a cath insertion  05/28/2016   PORT-A-CATH REMOVAL Right 08/19/2017   Procedure: REMOVAL PORT-A-CATH;  Surgeon: Manus Rudd, MD;  Location: [facility];  Service: General;  Laterality: Right;   PORTACATH PLACEMENT Right 05/28/2016   Procedure: INSERTION PORT-A-CATH;  Surgeon: Manus Rudd,  MD;  Location: [facility] OR;  Service: General;  Laterality: Right;   TOTAL MASTECTOMY Left 05/28/2016   Procedure: LEFT  MASTECTOMY;  Surgeon: Manus Rudd, MD;  Location: [facility] OR;  Service: General;  Laterality: Left;   TUBAL LIGATION  22 yrs since  2004.   Bilateral.   VENTRAL HERNIA REPAIR  02/17/2018   open   VENTRAL HERNIA REPAIR N/A 02/17/2018   Procedure: OPEN VENTRAL HERNIA REPAIR WITH MESH;  Surgeon: Manus Rudd, MD;  Location: [facility] OR;  Service: General;  Laterality: N/A;    I have reviewed the social history and family history with the patient and they are unchanged from previous note.  ALLERGIES:  is allergic to codeine.  MEDICATIONS:  Current Outpatient Medications  Medication Sig Dispense Refill   aspirin 81 MG tablet Take 81 mg by mouth daily as needed (leg pain).     atorvastatin (LIPITOR) 20 MG tablet Take 1 tablet (20 mg total) by mouth daily. 90 tablet 3   Blood Glucose Monitoring Suppl (ONETOUCH VERIO) w/Device KIT Use to check blood sugar 3  times a day. E11.9 1 kit 0   dapagliflozin propanediol (FARXIGA) 10 MG TABS tablet Take 1 tablet (10 mg total) by mouth daily. 90 tablet 3   gabapentin (NEURONTIN) 300 MG capsule TAKE 2 CAPSULES BY MOUTH AT BEDTIME 60 capsule 0   ibuprofen (ADVIL) 800 MG tablet Take 800 mg by mouth every 6 (six) hours as needed.     Insulin Pen Needle (BD PEN NEEDLE NANO 2ND GEN) 32G X 4 MM MISC USE 1  TWICE DAILY 200 each 3   letrozole (FEMARA) 2.5 MG tablet Take 1 tablet (2.5 mg total) by mouth daily. 90 tablet 3   lisinopril-hydrochlorothiazide (ZESTORETIC) 20-25 MG tablet Take 1 tablet by mouth daily. 90 tablet 3   metFORMIN (GLUCOPHAGE) 1000 MG tablet Take 1 tablet (1,000 mg total) by mouth 2 (two) times daily with a meal. 180 tablet 3   NON FORMULARY Peripheral Neuropathy cream from [facility] (Patient not taking: Reported on 07/14/2023)     Omega-3 Fatty Acids (FISH OIL) 1200 MG CAPS Take 1,200 mg by mouth daily.      ONETOUCH VERIO test strip USE AS DIRECTED 100 each 0   TRESIBA FLEXTOUCH 100 UNIT/ML FlexTouch Pen INJECT 30 UNITS SUBCUTANEOUSLY ONCE DAILY 30 mL 0   TRUEPLUS LANCETS 33G MISC 1 each by Does not apply route 3 (three) times daily. 600 each 2   No current facility-administered medications for this visit.    PHYSICAL EXAMINATION: ECOG PERFORMANCE STATUS: 0 - Asymptomatic  There were no vitals filed for this visit. Wt Readings from Last 3 Encounters:  07/14/23 220 lb (99.8 kg)  02/17/23 218 lb 9.6 oz (99.2 kg)  01/28/23 226 lb (102.5 kg)     GENERAL:alert, no distress and comfortable SKIN: skin color normal, no rashes or significant lesions EYES: normal, Conjunctiva are pink and non-injected, sclera clear  NEURO: alert & oriented x 3 with fluent speech NECK: (-)supple, thyroid normal size, non-tender, without nodularity LYMPH: (-) no palpable lymphadenopathy in the cervical, axillary  BREAST: RT breast, no palpable mass, LT breast  lumpectomy, scar tissue near the incision  line , no palpable mass breast exam benign.  LABORATORY DATA:  I have reviewed the data as listed    Latest Ref Rng & Units 04/22/2023    2:20 PM 01/28/2023   11:36 AM 08/06/2022   10:18 AM  CBC  WBC 3.8 - 10.8 Thousand/uL 9.7  7.8  8.4   Hemoglobin 11.7 - 15.5 g/dL 95.2  84.1  32.4   Hematocrit 35.0 - 45.0 % 32.9  31.3  33.1   Platelets 140 - 400 Thousand/uL 345  342.0  323         Latest Ref Rng & Units 01/28/2023   11:36 AM 08/06/2022   10:18 AM 02/25/2022   12:10 PM  CMP  Glucose 70 - 99 mg/dL 401  027  253   BUN 6 - 23 mg/dL 35  35  30   Creatinine 0.40 - 1.20 mg/dL 6.64  4.03  4.74   Sodium 135 - 145 mEq/L 139  138  138   Potassium 3.5 - 5.1 mEq/L 4.4  4.5  4.5   Chloride 96 - 112 mEq/L 106  108  105   CO2 19 - 32 mEq/L 24  22  24    Calcium 8.4 - 10.5 mg/dL 9.3  9.7  9.9   Total Protein 6.0 - 8.3 g/dL 7.1  7.8  8.1   Total Bilirubin 0.2 - 1.2 mg/dL 0.4  0.4  0.5   Alkaline Phos 39 - 117 U/L 69  63  68   AST 0 - 37 U/L 19  18  22    ALT 0 - 35 U/L 18  15  22        RADIOGRAPHIC STUDIES: I have personally reviewed the radiological images as listed and agreed with the findings in the report. No results found.    No orders of the defined types were placed in this encounter.  All questions were answered. The patient knows to call the clinic with any problems, questions or concerns. No barriers to learning was detected. The total time spent in the appointment was 25 minutes.     Malachy Mood, MD 08/04/2023   Carolin Coy, CMA, am acting as scribe for Malachy Mood, MD.   I have reviewed the above documentation for accuracy and completeness, and I agree with the above.

## 2023-08-07 ENCOUNTER — Encounter: Payer: Self-pay | Admitting: Hematology

## 2023-08-20 ENCOUNTER — Telehealth: Payer: Self-pay | Admitting: Family Medicine

## 2023-08-20 ENCOUNTER — Other Ambulatory Visit: Payer: Self-pay | Admitting: Nurse Practitioner

## 2023-08-20 DIAGNOSIS — C50112 Malignant neoplasm of central portion of left female breast: Secondary | ICD-10-CM

## 2023-08-20 NOTE — Telephone Encounter (Signed)
Pt advise she has had both and doesn't need anymore

## 2023-08-20 NOTE — Telephone Encounter (Signed)
Pt would like to know what was the last shingle shot she had and is it time for her next one.. I see the last one was 5.26.2021

## 2023-08-24 ENCOUNTER — Ambulatory Visit (INDEPENDENT_AMBULATORY_CARE_PROVIDER_SITE_OTHER): Payer: No Typology Code available for payment source | Admitting: Family Medicine

## 2023-08-24 ENCOUNTER — Ambulatory Visit (INDEPENDENT_AMBULATORY_CARE_PROVIDER_SITE_OTHER)
Admission: RE | Admit: 2023-08-24 | Discharge: 2023-08-24 | Disposition: A | Discharge status: Home / Self Care | Payer: No Typology Code available for payment source | Source: Ambulatory Visit | Attending: Family Medicine | Admitting: Family Medicine

## 2023-08-24 ENCOUNTER — Encounter: Payer: Self-pay | Admitting: Family Medicine

## 2023-08-24 VITALS — BP 130/60 | HR 101 | Temp 97.8°F | Ht 67.75 in | Wt 223.4 lb

## 2023-08-24 DIAGNOSIS — M25572 Pain in left ankle and joints of left foot: Secondary | ICD-10-CM | POA: Diagnosis not present

## 2023-08-24 DIAGNOSIS — M79675 Pain in left toe(s): Secondary | ICD-10-CM | POA: Diagnosis not present

## 2023-08-24 DIAGNOSIS — M79672 Pain in left foot: Secondary | ICD-10-CM

## 2023-08-24 DIAGNOSIS — S92352A Displaced fracture of fifth metatarsal bone, left foot, initial encounter for closed fracture: Secondary | ICD-10-CM | POA: Diagnosis not present

## 2023-08-24 DIAGNOSIS — W19XXXA Unspecified fall, initial encounter: Secondary | ICD-10-CM | POA: Diagnosis not present

## 2023-08-24 DIAGNOSIS — M7662 Achilles tendinitis, left leg: Secondary | ICD-10-CM | POA: Diagnosis not present

## 2023-08-24 NOTE — Progress Notes (Signed)
Patient ID: Lindsey Marquez, female    DOB: December 14, 1950, 72 y.o.   MRN: [id number]  This visit was conducted in person.  BP 130/60 (BP Location: Left Arm, Patient Position: Sitting, Cuff Size: Large)   Pulse (!) 101   Temp 97.8 F (36.6 C) (Temporal)   Ht 5' 7.75" (1.721 m)   Wt 223 lb 6 oz (101.3 kg)   SpO2 97%   BMI 34.22 kg/m    CC:  Chief Complaint  Patient presents with   Ankle Pain    Left   Fall    Week ago Sunday    Subjective:   HPI: Lindsey Marquez is a 72 y.o. female presenting on 08/24/2023 for Ankle Pain (Left) and Fall (Week ago Sunday)  Patient reports accidental fall 1 week ago, tripped.  No proceeding symptoms, no CP, no dizziness.  Twisted ankle to side.   Immediate pain in left lateral ankle.  Next day noted swelling  Occ sharp pain in lower leg.   Using ice, elevation and   ibuprofen 600 mg  every 6 hours.        Relevant past medical, surgical, family and social history reviewed and updated as indicated. Interim medical history since our last visit reviewed. Allergies and medications reviewed and updated. Outpatient Medications Prior to Visit  Medication Sig Dispense Refill   aspirin 81 MG tablet Take 81 mg by mouth daily as needed (leg pain).     atorvastatin (LIPITOR) 20 MG tablet Take 1 tablet (20 mg total) by mouth daily. 90 tablet 3   Blood Glucose Monitoring Suppl (ONETOUCH VERIO) w/Device KIT Use to check blood sugar 3 times a day. E11.9 1 kit 0   dapagliflozin propanediol (FARXIGA) 10 MG TABS tablet Take 1 tablet (10 mg total) by mouth daily. 90 tablet 3   gabapentin (NEURONTIN) 300 MG capsule TAKE 2 CAPSULES BY MOUTH AT BEDTIME 60 capsule 0   ibuprofen (ADVIL) 800 MG tablet Take 800 mg by mouth every 6 (six) hours as needed.     Insulin Pen Needle (BD PEN NEEDLE NANO 2ND GEN) 32G X 4 MM MISC USE 1  TWICE DAILY 200 each 3   letrozole (FEMARA) 2.5 MG tablet Take 1 tablet by mouth once daily 30 tablet 0   lisinopril-hydrochlorothiazide  (ZESTORETIC) 20-25 MG tablet Take 1 tablet by mouth daily. 90 tablet 3   metFORMIN (GLUCOPHAGE) 1000 MG tablet Take 1 tablet (1,000 mg total) by mouth 2 (two) times daily with a meal. 180 tablet 3   NON FORMULARY Peripheral Neuropathy cream from [facility]     Omega-3 Fatty Acids (FISH OIL) 1200 MG CAPS Take 1,200 mg by mouth daily.      ONETOUCH VERIO test strip USE AS DIRECTED 100 each 0   TRESIBA FLEXTOUCH 100 UNIT/ML FlexTouch Pen INJECT 30 UNITS SUBCUTANEOUSLY ONCE DAILY 30 mL 0   TRUEPLUS LANCETS 33G MISC 1 each by Does not apply route 3 (three) times daily. 600 each 2   No facility-administered medications prior to visit.     Per HPI unless specifically indicated in ROS section below Review of Systems  Constitutional:  Negative for fatigue and fever.  HENT:  Negative for congestion.   Eyes:  Negative for pain.  Respiratory:  Negative for cough and shortness of breath.   Cardiovascular:  Negative for chest pain, palpitations and leg swelling.  Gastrointestinal:  Negative for abdominal pain.  Genitourinary:  Negative for dysuria and vaginal bleeding.  Musculoskeletal:  Negative for back pain.  Neurological:  Negative for syncope, light-headedness and headaches.  Psychiatric/Behavioral:  Negative for dysphoric mood.    Objective:  BP 130/60 (BP Location: Left Arm, Patient Position: Sitting, Cuff Size: Large)   Pulse (!) 101   Temp 97.8 F (36.6 C) (Temporal)   Ht 5' 7.75" (1.721 m)   Wt 223 lb 6 oz (101.3 kg)   SpO2 97%   BMI 34.22 kg/m   Wt Readings from Last 3 Encounters:  08/24/23 223 lb 6 oz (101.3 kg)  08/04/23 222 lb 1.6 oz (100.7 kg)  07/14/23 220 lb (99.8 kg)      Physical Exam Constitutional:      General: She is not in acute distress.    Appearance: Normal appearance. She is well-developed. She is not ill-appearing or toxic-appearing.  HENT:     Head: Normocephalic.     Right Ear: Hearing, tympanic membrane, ear canal and external ear normal.  Tympanic membrane is not erythematous, retracted or bulging.     Left Ear: Hearing, tympanic membrane, ear canal and external ear normal. Tympanic membrane is not erythematous, retracted or bulging.     Nose: No mucosal edema or rhinorrhea.     Right Sinus: No maxillary sinus tenderness or frontal sinus tenderness.     Left Sinus: No maxillary sinus tenderness or frontal sinus tenderness.     Mouth/Throat:     Mouth: Oropharynx is clear and moist and mucous membranes are normal.     Pharynx: Uvula midline.  Eyes:     General: Lids are normal. Lids are everted, no foreign bodies appreciated.     Extraocular Movements: EOM normal.     Conjunctiva/sclera: Conjunctivae normal.     Pupils: Pupils are equal, round, and reactive to light.  Neck:     Thyroid: No thyroid mass or thyromegaly.     Vascular: No carotid bruit.     Trachea: Trachea normal.  Cardiovascular:     Rate and Rhythm: Normal rate and regular rhythm.     Pulses: Normal pulses.     Heart sounds: Normal heart sounds, S1 normal and S2 normal. No murmur heard.    No friction rub. No gallop.  Pulmonary:     Effort: Pulmonary effort is normal. No tachypnea or respiratory distress.     Breath sounds: Normal breath sounds. No decreased breath sounds, wheezing, rhonchi or rales.  Abdominal:     General: Bowel sounds are normal.     Palpations: Abdomen is soft.     Tenderness: There is no abdominal tenderness.  Musculoskeletal:     Cervical back: Normal range of motion and neck supple.     Left ankle: Swelling present. Tenderness present over the lateral malleolus, base of 5th metatarsal and proximal fibula. No medial malleolus tenderness. Decreased range of motion. Anterior drawer test negative. Normal pulse.     Left Achilles Tendon: Normal.     Right foot: Normal.     Left foot: Decreased range of motion. Swelling, tenderness and bony tenderness present.     Comments: Fairly diffuse tenderness to palpation over dorsal foot  lateral ankle and lower leg, no tenderness medially or over anterior ankle joint. Tenderness to palpation with squeeze test mid calf Significant swelling diffusely in foot and ankle.  Skin:    General: Skin is warm, dry and intact.     Findings: No rash.  Neurological:     Mental Status: She is alert.  Psychiatric:  Mood and Affect: Mood is not anxious or depressed.        Speech: Speech normal.        Behavior: Behavior normal. Behavior is cooperative.        Thought Content: Thought content normal.        Cognition and Memory: Cognition and memory normal.        Judgment: Judgment normal.       Results for orders placed or performed in visit on 08/04/23  CMP (Cancer Center only)  Result Value Ref Range   Sodium 141 135 - 145 mmol/L   Potassium 4.5 3.5 - 5.1 mmol/L   Chloride 106 98 - 111 mmol/L   CO2 28 22 - 32 mmol/L   Glucose, Bld 141 (H) 70 - 99 mg/dL   BUN 28 (H) 8 - 23 mg/dL   Creatinine 5.40 (H) 9.81 - 1.00 mg/dL   Calcium 9.4 8.9 - 19.1 mg/dL   Total Protein 7.7 6.5 - 8.1 g/dL   Albumin 4.4 3.5 - 5.0 g/dL   AST 17 15 - 41 U/L   ALT 16 0 - 44 U/L   Alkaline Phosphatase 72 38 - 126 U/L   Total Bilirubin 0.4 0.3 - 1.2 mg/dL   GFR, Estimated 47 (L) >60 mL/min   Anion gap 7 5 - 15  CBC with Differential (Cancer Center Only)  Result Value Ref Range   WBC Count 7.8 4.0 - 10.5 K/uL   RBC 3.49 (L) 3.87 - 5.11 MIL/uL   Hemoglobin 12.0 12.0 - 15.0 g/dL   HCT 47.8 (L) 29.5 - 62.1 %   MCV 100.3 (H) 80.0 - 100.0 fL   MCH 34.4 (H) 26.0 - 34.0 pg   MCHC 34.3 30.0 - 36.0 g/dL   RDW 30.8 65.7 - 84.6 %   Platelet Count 336 150 - 400 K/uL   nRBC 0.0 0.0 - 0.2 %   Neutrophils Relative % 65 %   Neutro Abs 5.0 1.7 - 7.7 K/uL   Lymphocytes Relative 26 %   Lymphs Abs 2.0 0.7 - 4.0 K/uL   Monocytes Relative 6 %   Monocytes Absolute 0.5 0.1 - 1.0 K/uL   Eosinophils Relative 3 %   Eosinophils Absolute 0.2 0.0 - 0.5 K/uL   Basophils Relative 0 %   Basophils Absolute 0.0  0.0 - 0.1 K/uL   Immature Granulocytes 0 %   Abs Immature Granulocytes 0.02 0.00 - 0.07 K/uL    Assessment and Plan Acute, ankle and foot injury following fall.  Symptoms most consistent with fairly high-grade sprain in left lateral ankle. Will evaluate with x-rays given exam. Initial reading of x-rays is negative for fracture.  I will await radiologist reading. Patient placed in Aircast, recommended continued ice, elevation, ibuprofen 600 to 800 mg 3 times daily as needed for pain and swelling. If final x-rays returned negative she will move forward with home physical therapy.  Given significant amount of pain recommend patient following up for reevaluation in 2 weeks with myself or PCP. Did discuss with patient that if symptoms are not improving as expected we can consider referral to Dr. Patsy Lager sports medicine.   Acute left ankle pain -     DG Ankle Complete Left; Future -     DG Foot Complete Left; Future  Acute foot pain, left -     DG Ankle Complete Left; Future -     DG Foot Complete Left; Future  Accidental fall, initial encounter -  DG Ankle Complete Left; Future -     DG Foot Complete Left; Future    Return in about 2 weeks (around 09/07/2023) for Follow-up left ankle sprain.   Kerby Nora, MD

## 2023-08-24 NOTE — Patient Instructions (Signed)
Continue to elevate left foot, ice 3-4 times a day for 10  minutes each time. Continue ibuprofen 600 to 800 mg every 6-8 hours as needed for pain and inflammation. We will call with x-ray results once they return. Start home range of motion exercises several times a day if x-rays returned negative. Call to schedule follow-up with Dr. Milinda Antis or myself in 2 weeks for reevaluation.

## 2023-08-25 ENCOUNTER — Other Ambulatory Visit: Payer: Self-pay | Admitting: Family Medicine

## 2023-08-25 DIAGNOSIS — S92351A Displaced fracture of fifth metatarsal bone, right foot, initial encounter for closed fracture: Secondary | ICD-10-CM

## 2023-08-25 DIAGNOSIS — S82831A Other fracture of upper and lower end of right fibula, initial encounter for closed fracture: Secondary | ICD-10-CM | POA: Insufficient documentation

## 2023-08-25 DIAGNOSIS — S92353A Displaced fracture of fifth metatarsal bone, unspecified foot, initial encounter for closed fracture: Secondary | ICD-10-CM | POA: Insufficient documentation

## 2023-08-27 ENCOUNTER — Other Ambulatory Visit: Payer: Self-pay | Admitting: Family Medicine

## 2023-08-27 DIAGNOSIS — Z0000 Encounter for general adult medical examination without abnormal findings: Secondary | ICD-10-CM

## 2023-08-27 DIAGNOSIS — Z1231 Encounter for screening mammogram for malignant neoplasm of breast: Secondary | ICD-10-CM

## 2023-09-01 ENCOUNTER — Other Ambulatory Visit: Payer: Self-pay | Admitting: Family Medicine

## 2023-09-01 ENCOUNTER — Inpatient Hospital Stay
Admission: RE | Admit: 2023-09-01 | Discharge: 2023-09-01 | Discharge status: Home / Self Care | Payer: No Typology Code available for payment source | Source: Ambulatory Visit | Attending: Family Medicine | Admitting: Family Medicine

## 2023-09-01 DIAGNOSIS — Z1231 Encounter for screening mammogram for malignant neoplasm of breast: Secondary | ICD-10-CM

## 2023-09-01 DIAGNOSIS — Z0000 Encounter for general adult medical examination without abnormal findings: Secondary | ICD-10-CM

## 2023-09-02 ENCOUNTER — Ambulatory Visit (INDEPENDENT_AMBULATORY_CARE_PROVIDER_SITE_OTHER): Payer: No Typology Code available for payment source | Admitting: Internal Medicine

## 2023-09-02 ENCOUNTER — Encounter: Payer: Self-pay | Admitting: Internal Medicine

## 2023-09-02 VITALS — BP 146/60 | HR 108 | Resp 20 | Ht 67.5 in | Wt 224.2 lb

## 2023-09-02 DIAGNOSIS — E785 Hyperlipidemia, unspecified: Secondary | ICD-10-CM | POA: Diagnosis not present

## 2023-09-02 DIAGNOSIS — Z794 Long term (current) use of insulin: Secondary | ICD-10-CM | POA: Diagnosis not present

## 2023-09-02 DIAGNOSIS — E66812 Obesity, class 2: Secondary | ICD-10-CM | POA: Diagnosis not present

## 2023-09-02 DIAGNOSIS — E1165 Type 2 diabetes mellitus with hyperglycemia: Secondary | ICD-10-CM

## 2023-09-02 DIAGNOSIS — E1169 Type 2 diabetes mellitus with other specified complication: Secondary | ICD-10-CM

## 2023-09-02 LAB — POCT GLYCOSYLATED HEMOGLOBIN (HGB A1C): Hemoglobin A1C: 7.5 % — AB (ref 4.0–5.6)

## 2023-09-02 MED ORDER — ONETOUCH VERIO VI STRP
ORAL_STRIP | 3 refills | Status: DC
Start: 2023-09-02 — End: 2024-10-13

## 2023-09-02 MED ORDER — METFORMIN HCL 1000 MG PO TABS
1000.0000 mg | ORAL_TABLET | Freq: Two times a day (BID) | ORAL | 3 refills | Status: DC
Start: 2023-09-02 — End: 2024-08-03

## 2023-09-02 MED ORDER — TRESIBA FLEXTOUCH 100 UNIT/ML ~~LOC~~ SOPN
30.0000 [IU] | PEN_INJECTOR | Freq: Every day | SUBCUTANEOUS | 3 refills | Status: DC
Start: 2023-09-02 — End: 2024-08-03

## 2023-09-02 MED ORDER — DAPAGLIFLOZIN PROPANEDIOL 10 MG PO TABS: 10.0000 mg | ORAL_TABLET | Freq: Every day | ORAL | 3 refills | Status: DC

## 2023-09-02 NOTE — Progress Notes (Addendum)
Patient ID: [name], female   DOB: Sep 10, 1951, 72 y.o.   MRN: [id number]   HPI: Lindsey Marquez is a 72 y.o.-year-old female, returning for follow-up for DM2, dx in ~2001, insulin-dependent, uncontrolled, without long-term complications. Last visit 6 months ago.  Interim history: No increased urination, blurry vision, nausea, chest pain. 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  On letrozole. She fractured her L foot 2 weeks ago when visiting her daughter in college.   Reviewed HbA1c levels: Lab Results  Component Value Date   HGBA1C 7.0 (A) 02/17/2023   HGBA1C 6.2 (A) 01/19/2022   HGBA1C 6.6 (A) 09/17/2021   HGBA1C 6.9 (A) 06/12/2021   HGBA1C 9.1 (A) 03/04/2021   HGBA1C 7.9 (A) 10/28/2020   HGBA1C 8.4 (H) 04/15/2020   HGBA1C 10.4 (A) 10/04/2019   HGBA1C 9.8 (H) 12/20/2018   HGBA1C 9.4 (A) 09/20/2018   HGBA1C 8.8 (H) 04/05/2018   HGBA1C 7.5 (H) 02/10/2018   HGBA1C 7.4 (H) 01/04/2018   HGBA1C 8.0 (H) 10/04/2017   HGBA1C 6.9 (H) 12/28/2016   HGBA1C 8.0 (H) 09/24/2016   HGBA1C 7.8 (H) 05/26/2016   HGBA1C 9.1 01/27/2016   Pt is on: - Basaglar 20 >> 30 >> 34-36 >> 20 >> Tresiba 30 units at bedtime - Metformin 1000 mg 2x a day with meals - missed the evening dose - Farxiga 10 mg daily before b'fast  GLP1 R agonists were not affordable.  Pt checks her sugars 2-3 a day per review of her log: - am 96-137, 160 (candy) >> 87-120, 131 >> 86-122 >> 93-136, 149, 154 - 2h after b'fast: n/c - before lunch: n/c >> 93-98, 122 >> 90s-101 >> n/c >> 93-114 - 2h after lunch: 80-174 >> n/c >> 84-143, 190 >> 85, 99-158 >> 172, 179 - before dinner: 180-195 >> 200s >> 103-145, 174 >> n/c  - 2h after dinner: 96-152, 179 >> 114-166, 189, 199 >> 100-194 - bedtime:  115-137, 182 >> 92-124, 146 >> n/c - nighttime: n/c Lowest sugar was 85 >> 93; she has hypoglycemia awareness in the 70s. Highest sugar was 199 >> 194.  Glucometer: True metrix >> One Touch Verio  Pt's meals  are: - Breakfast:coffee, mc muffin, egg - Lunch: [place] sandwich - Dinner: baked chicken, veggies,dessert - apples, - Snacks: yoghurt, pretzels, PB  -No CKD, last BUN/creatinine:  Lab Results  Component Value Date   BUN 28 (H) 08/04/2023   BUN 35 (H) 01/28/2023   CREATININE 1.22 (H) 08/04/2023   CREATININE 0.94 01/28/2023  No results found for: "MICRALBCREAT" On lisinopril 10.  -+ HL; last set of lipids: Lab Results  Component Value Date   CHOL 102 01/28/2023   HDL 50.80 01/28/2023   LDLCALC 27 01/28/2023   LDLDIRECT 113.0 04/15/2020   TRIG 120.0 01/28/2023   CHOLHDL 2 01/28/2023  On Lipitor 10 >> 20 mg, fish oil.  - last eye exam was 06/15/2023:  No DR reportedly.  - no numbness and tingling in her feet.  Last foot exam was per Dr. Eloy End 03/2023.  She fractured the fifth L metatarsal in 08/2023. On ASA 81.  Pt has FH of DM in brother, sister, father.  + h/o BrCA in 2013, recurrence in 2018.  She is cancer free.    ROS: + See HPI  Current Outpatient Medications on File Prior to Visit  Medication Sig   aspirin 81 MG tablet Take 81 mg by mouth daily as needed (leg pain).   atorvastatin (  LIPITOR) 20 MG tablet Take 1 tablet (20 mg total) by mouth daily.   Blood Glucose Monitoring Suppl (ONETOUCH VERIO) w/Device KIT Use to check blood sugar 3 times a day. E11.9   dapagliflozin propanediol (FARXIGA) 10 MG TABS tablet Take 1 tablet (10 mg total) by mouth daily.   gabapentin (NEURONTIN) 300 MG capsule TAKE 2 CAPSULES BY MOUTH AT BEDTIME   ibuprofen (ADVIL) 800 MG tablet Take 800 mg by mouth every 6 (six) hours as needed.   Insulin Pen Needle (BD PEN NEEDLE NANO 2ND GEN) 32G X 4 MM MISC USE 1  TWICE DAILY   letrozole (FEMARA) 2.5 MG tablet Take 1 tablet by mouth once daily   lisinopril-hydrochlorothiazide (ZESTORETIC) 20-25 MG tablet Take 1 tablet by mouth daily.   metFORMIN (GLUCOPHAGE) 1000 MG tablet Take 1 tablet (1,000 mg total) by mouth 2 (two) times daily with a meal.    NON FORMULARY Peripheral Neuropathy cream from [facility]   Omega-3 Fatty Acids (FISH OIL) 1200 MG CAPS Take 1,200 mg by mouth daily.    ONETOUCH VERIO test strip USE AS DIRECTED   TRESIBA FLEXTOUCH 100 UNIT/ML FlexTouch Pen INJECT 30 UNITS SUBCUTANEOUSLY ONCE DAILY   TRUEPLUS LANCETS 33G MISC 1 each by Does not apply route 3 (three) times daily.   No current facility-administered medications on file prior to visit.   Past Medical History:  Diagnosis Date   Blood transfusion without reported diagnosis    Breast cancer (HCC) 06/2001   Invasive ductal carcinoma Left breast. 2002, 2017   Diabetes mellitus without complication (HCC)    Difficult intravenous access    Family history of malignant neoplasm of ovary    Family history of pancreatic cancer    Hypertension    Past Surgical History:  Procedure Laterality Date   BREAST SURGERY Left 2003   COLONOSCOPY     FOOT SURGERY  2000   INSERTION OF MESH N/A 02/17/2018   Procedure: INSERTION OF MESH;  Surgeon: Manus Rudd, MD;  Location: [facility] OR;  Service: General;  Laterality: N/A;   MASTECTOMY Left 05/28/2016   port a cath insertion  05/28/2016   PORT-A-CATH REMOVAL Right 08/19/2017   Procedure: REMOVAL PORT-A-CATH;  Surgeon: Manus Rudd, MD;  Location: [facility];  Service: General;  Laterality: Right;   PORTACATH PLACEMENT Right 05/28/2016   Procedure: INSERTION PORT-A-CATH;  Surgeon: Manus Rudd, MD;  Location: [facility] OR;  Service: General;  Laterality: Right;   TOTAL MASTECTOMY Left 05/28/2016   Procedure: LEFT  MASTECTOMY;  Surgeon: Manus Rudd, MD;  Location: [facility] OR;  Service: General;  Laterality: Left;   TUBAL LIGATION  22 yrs since  2004.   Bilateral.   VENTRAL HERNIA REPAIR  02/17/2018   open   VENTRAL HERNIA REPAIR N/A 02/17/2018   Procedure: OPEN VENTRAL HERNIA REPAIR WITH MESH;  Surgeon: Manus Rudd, MD;  Location: [facility] OR;  Service: General;  Laterality: N/A;   Social History    Socioeconomic History   Marital status: Divorced    Spouse name: Not on file   Number of children: Not on file   Years of education: Not on file   Highest education level: Not on file  Occupational History   Not on file  Tobacco Use   Smoking status: Former    Current packs/day: 0.00    Average packs/day: 0.1 packs/day for 7.0 years (0.7 ttl pk-yrs)    Types: Cigarettes    Start date: 11/30/1968    Quit date: 12/01/1975  Years since quitting: 47.7   Smokeless tobacco: Never  Vaping Use   Vaping status: Never Used  Substance and Sexual Activity   Alcohol use: No    Alcohol/week: 0.0 standard drinks of alcohol   Drug use: No   Sexual activity: Yes  Other Topics Concern   Not on file  Social History Narrative   Not on file   Social Determinants of Health   Financial Resource Strain: Low Risk  (07/14/2023)   Overall Financial Resource Strain (CARDIA)    Difficulty of Paying Living Expenses: Not hard at all  Food Insecurity: No Food Insecurity (07/14/2023)   Hunger Vital Sign    Worried About Running Out of Food in the Last Year: Never true    Ran Out of Food in the Last Year: Never true  Transportation Needs: No Transportation Needs (07/14/2023)   PRAPARE - Administrator, Civil Service (Medical): No    Lack of Transportation (Non-Medical): No  Physical Activity: Insufficiently Active (07/14/2023)   Exercise Vital Sign    Days of Exercise per Week: 3 days    Minutes of Exercise per Session: 20 min  Stress: No Stress Concern Present (07/14/2023)   [employer] of Occupational Health - Occupational Stress Questionnaire    Feeling of Stress : Not at all  Social Connections: Moderately Integrated (07/14/2023)   Social Connection and Isolation Panel [NHANES]    Frequency of Communication with Friends and Family: More than three times a week    Frequency of Social Gatherings with Friends and Family: More than three times a week    Attends Religious Services:  More than 4 times per year    Active Member of [employer] or Organizations: Yes    Attends Engineer, structural: More than 4 times per year    Marital Status: Divorced  Intimate Partner Violence: Not At Risk (07/14/2023)   Humiliation, Afraid, Rape, and Kick questionnaire    Fear of Current or Ex-Partner: No    Emotionally Abused: No    Physically Abused: No    Sexually Abused: No    Allergies  Allergen Reactions   Codeine Other (See Comments)    "get crazy", "see things that aren't there"   Family History  Problem Relation Age of Onset   Pancreatic cancer Mother 1       deceased   Prostate cancer Father    Colon polyps Sister    Ovarian cancer Maternal Aunt 72       deceased   Cancer Maternal Aunt        2 other mat aunts; unk. primary in 37s; deceased   Cancer Maternal Uncle        "bone ca"; unk. primary; deceased 90s   Prostate cancer Maternal Uncle        deceased 63   Colon cancer Neg Hx    Esophageal cancer Neg Hx    Rectal cancer Neg Hx    Stomach cancer Neg Hx    PE:  BP (!) 146/60 (BP Location: Right Arm, Patient Position: Sitting, Cuff Size: Normal)   Pulse (!) 108   Resp 20   Ht 5' 7.5" (1.715 m)   Wt 224 lb 3.2 oz (101.7 kg)   SpO2 96%   BMI 34.60 kg/m  Wt Readings from Last 10 Encounters:  09/02/23 224 lb 3.2 oz (101.7 kg)  08/24/23 223 lb 6 oz (101.3 kg)  08/04/23 222 lb 1.6 oz (100.7 kg)  07/14/23 220 lb (99.8  kg)  02/17/23 218 lb 9.6 oz (99.2 kg)  01/28/23 226 lb (102.5 kg)  09/23/22 214 lb (97.1 kg)  09/11/22 218 lb 9.6 oz (99.2 kg)  09/11/22 214 lb (97.1 kg)  08/06/22 223 lb 11.2 oz (101.5 kg)   Constitutional: overweight, in NAD Eyes:  EOMI, no exophthalmos ENT: no neck masses, no cervical lymphadenopathy Cardiovascular: Tachycardia, RR, No MRG, L foot wrapped, edematous Respiratory: CTA B Musculoskeletal: no deformities Skin:no rashes Neurological: no tremor with outstretched hands  ASSESSMENT: 1. DM2, insulin-dependent,  uncontrolled, without long-term complications, but with hyperglycemia  2. HL  3.  Obesity class II  PLAN:  1. Patient with longstanding, uncontrolled, type 2 diabetes, on oral antidiabetic regimen with metformin and SGLT2 inhibitor and also long-acting insulin, with worse control at last visit, with an HbA1c returned 7.0%, increased from 6.7%.  Sugars are mostly at goal at that time but they appear to have improved before the time of the appointment.  I did not recommend a change in regimen. -At today's visit, sugars do not appear to have improved since last visit and they are mostly at goal.  I am surprised about the higher HbA1c.  I advised her to continue the same regimen for now but will check a fructosamine level today.  If this confirms a high HbA1c, she will need to change her test strips (refill sent).  If they show higher blood sugars, we will need to intensify her regimen. - I suggested to: Patient Instructions  Please continue: - Metformin 1000 mg 2x a day with meals - Farxiga 10 mg daily before b'fast - Tresiba 30 units at bedtime  Please stop at the lab.  Please come back for a follow-up appointment in 4 months.  - we checked her HbA1c: 7.5% (higher) - advised to check sugars at different times of the day - 1x a day, rotating check times - advised for yearly eye exams >> she is UTD -Check an ACR todaywill - return to clinic in 4 months  2. HL -Reviewed latest lipid panel from 12/2022: All fractions at goal: Lab Results  Component Value Date   CHOL 102 01/28/2023   HDL 50.80 01/28/2023   LDLCALC 27 01/28/2023   LDLDIRECT 113.0 04/15/2020   TRIG 120.0 01/28/2023   CHOLHDL 2 01/28/2023  -She is on Lipitor 20 mg daily and fish oil without side effects  3.  Obesity class II -Will continue [place] which should also help with weight loss -She gained 4 pounds before last visit and 6 more since then  Component     Latest Ref Rng 09/02/2023  Hemoglobin A1C     4.0 - 5.6  % 7.5 !   Microalb, Ur     0.0 - 1.9 mg/dL 0.8   Creatinine,U     mg/dL 56.2   MICROALB/CREAT RATIO     0.0 - 30.0 mg/g 1.3   Fructosamine     205 - 285 umol/L 286 (H)   ACR normal. HbA1c calculated from fructosamine is 6.47%, lower than the directly measured HbA1c.  Carlus Pavlov, MD PhD [facility]

## 2023-09-02 NOTE — Addendum Note (Signed)
Addended by: Tera Partridge on: 09/02/2023 03:21 PM   Modules accepted: Orders

## 2023-09-02 NOTE — Patient Instructions (Addendum)
Please continue: - Metformin 1000 mg 2x a day with meals - Farxiga 10 mg daily before b'fast - Tresiba 30 units at bedtime  Please stop at the lab.  Please come back for a follow-up appointment in 4 months.

## 2023-09-03 DIAGNOSIS — S92355A Nondisplaced fracture of fifth metatarsal bone, left foot, initial encounter for closed fracture: Secondary | ICD-10-CM | POA: Diagnosis not present

## 2023-09-03 LAB — MICROALBUMIN / CREATININE URINE RATIO
Creatinine,U: 61.6 mg/dL
Microalb Creat Ratio: 1.3 mg/g (ref 0.0–30.0)
Microalb, Ur: 0.8 mg/dL (ref 0.0–1.9)

## 2023-09-05 LAB — FRUCTOSAMINE: Fructosamine: 286 umol/L — ABNORMAL HIGH (ref 205–285)

## 2023-09-07 ENCOUNTER — Ambulatory Visit (INDEPENDENT_AMBULATORY_CARE_PROVIDER_SITE_OTHER): Payer: No Typology Code available for payment source | Admitting: Family Medicine

## 2023-09-07 ENCOUNTER — Encounter: Payer: Self-pay | Admitting: Family Medicine

## 2023-09-07 VITALS — BP 126/70 | HR 84 | Temp 98.6°F | Ht 67.5 in | Wt 222.1 lb

## 2023-09-07 DIAGNOSIS — S92352D Displaced fracture of fifth metatarsal bone, left foot, subsequent encounter for fracture with routine healing: Secondary | ICD-10-CM | POA: Diagnosis not present

## 2023-09-07 DIAGNOSIS — S92352S Displaced fracture of fifth metatarsal bone, left foot, sequela: Secondary | ICD-10-CM | POA: Diagnosis not present

## 2023-09-07 DIAGNOSIS — S92352A Displaced fracture of fifth metatarsal bone, left foot, initial encounter for closed fracture: Secondary | ICD-10-CM

## 2023-09-07 DIAGNOSIS — S82831S Other fracture of upper and lower end of right fibula, sequela: Secondary | ICD-10-CM | POA: Diagnosis not present

## 2023-09-07 NOTE — Assessment & Plan Note (Signed)
Doing well/making progress Less swelling and pain  Was seen in ortho at [facility] and note was sent for  Reviewed note from Dr Ermalene Searing Reviewed xrays of foot and ankle   Encouraged her to continue Wearing boot for weight bearing  Elevate when able Cold compress when able  Use tylenol for pain (less nsaid due to renal changes)   Call back and Er precautions noted in detail today   Will follow up with ortho as planned

## 2023-09-07 NOTE — Patient Instructions (Addendum)
Tylenol is ok for pain  Only use the ibuprofen if you really need it because it is hard on the kidneys   Keep elevating foot when you sit   Cold compress every chance you get (a bag of frozen peas or corn works well)    Keep using the boot  Follow up with orthopedics as planned   Please let us know if pain or swelling increase or for other concerns

## 2023-09-07 NOTE — Progress Notes (Signed)
Subjective:    Patient ID: Lindsey Marquez, female    DOB: 04/12/1951, 72 y.o.   MRN: [id number]  HPI  Wt Readings from Last 3 Encounters:  09/07/23 222 lb 2 oz (100.8 kg)  09/02/23 224 lb 3.2 oz (101.7 kg)  08/24/23 223 lb 6 oz (101.3 kg)   34.28 kg/m  Vitals:   09/07/23 1142  BP: 126/70  Pulse: 84  Temp: 98.6 F (37 C)  SpO2: 99%    Pt presents for 2 week follow up of ankle injury   She was seen by Dr Ermalene Searing on 08/24/23 for injury after accidental fall -twisting her left ankle  She missed a step when rushing/busy  Exam consistent with high grade sprain   Had imaging   Foot xray report IMPRESSION: 1. Cortical irregularity about the distal fibular tip may represent a nondisplaced fracture. 2. Minimally displaced fracture of the base of the fifth metatarsal. 3. Irregularity involving the anterolateral aspect of the calcaneus, indeterminate for fracture or accessory ossicle. 4. Lucency through the fifth toe proximal phalanx, suspect this is chronic although may be an acute fracture if there is focal pain in this region.  Minimally displaced fracture of base of 5th metatarsal and possible distal fib fracture   Ibuprofen 600-800 mg was recommended along with ice and elevation  Moved ahead with semi urgent ortho referral   She went to [facility] - given a boot  Was told to wear the boot for 6 weeks Has follow up end of this month  Then may do some PT   Hurts when she is on it for a while   She has normal dexa in 07/2022  This was not a fragility fracture   Using ice when she can and elevate it   Overall improved     Lab Results  Component Value Date   NA 141 08/04/2023   K 4.5 08/04/2023   CO2 28 08/04/2023   GLUCOSE 141 (H) 08/04/2023   BUN 28 (H) 08/04/2023   CREATININE 1.22 (H) 08/04/2023   CALCIUM 9.4 08/04/2023   GFR 60.98 01/28/2023   EGFR >60 09/21/2017   GFRNONAA 47 (L) 08/04/2023     Patient Active Problem List   Diagnosis Date  Noted   Closed fracture of right distal fibula 08/25/2023   Closed fracture of base of fifth metatarsal bone 08/25/2023   Personal history of breast cancer 04/12/2023   Routine general medical examination at a health care facility 01/28/2023   Venous insufficiency 01/28/2023   Normocytic anemia 01/28/2023   Bilateral hip pain 07/17/2021   CKD (chronic kidney disease) stage 3, GFR 30-59 ml/min (HCC) 06/05/2021   Screening for malignant neoplasm of colon 02/17/2021   History of colonic polyps 11/18/2020   Drug-induced polyneuropathy (HCC) 04/15/2020   Osteoarthritis 04/15/2020   Hyperlipidemia associated with type 2 diabetes mellitus (HCC) 09/20/2018   Class 1 obesity due to excess calories with body mass index (BMI) of 34.0 to 34.9 in adult 09/20/2018   Obesity (BMI 30.0-34.9) 05/18/2016   Cancer of central portion of left breast (HCC) 05/07/2016   HTN (hypertension) 03/24/2016   Type 2 diabetes mellitus with hyperglycemia, with long-term current use of insulin (HCC) 03/24/2016   Past Medical History:  Diagnosis Date   Blood transfusion without reported diagnosis    Breast cancer (HCC) 06/2001   Invasive ductal carcinoma Left breast. 2002, 2017   Diabetes mellitus without complication (HCC)    Difficult intravenous access    Family  history of malignant neoplasm of ovary    Family history of pancreatic cancer    Hypertension    Past Surgical History:  Procedure Laterality Date   BREAST SURGERY Left 2003   COLONOSCOPY     FOOT SURGERY  2000   INSERTION OF MESH N/A 02/17/2018   Procedure: INSERTION OF MESH;  Surgeon: Manus Rudd, MD;  Location[facility];  Service: General;  Laterality: N/A;   MASTECTOMY Left 05/28/2016   port a cath insertion  05/28/2016   PORT-A-CATH REMOVAL Right 08/19/2017   Procedure: REMOVAL PORT-A-CATH;  Surgeon: Manus Rudd, MD;  Location: [facility];  Service: General;  Laterality: Right;   PORTACATH PLACEMENT Right 05/28/2016    Procedure: INSERTION PORT-A-CATH;  Surgeon: Manus Rudd, MD;  Location: [facility] OR;  Service: General;  Laterality: Right;   TOTAL MASTECTOMY Left 05/28/2016   Procedure: LEFT  MASTECTOMY;  Surgeon: Manus Rudd, MD;  Location: [facility] OR;  Service: General;  Laterality: Left;   TUBAL LIGATION  22 yrs since  2004.   Bilateral.   VENTRAL HERNIA REPAIR  02/17/2018   open   VENTRAL HERNIA REPAIR N/A 02/17/2018   Procedure: OPEN VENTRAL HERNIA REPAIR WITH MESH;  Surgeon: Manus Rudd, MD;  Location: [facility] OR;  Service: General;  Laterality: N/A;   Social History   Tobacco Use   Smoking status: Former    Current packs/day: 0.00    Average packs/day: 0.1 packs/day for 7.0 years (0.7 ttl pk-yrs)    Types: Cigarettes    Start date: 11/30/1968    Quit date: 12/01/1975    Years since quitting: 47.8   Smokeless tobacco: Never  Vaping Use   Vaping status: Never Used  Substance Use Topics   Alcohol use: No    Alcohol/week: 0.0 standard drinks of alcohol   Drug use: No   Family History  Problem Relation Age of Onset   Pancreatic cancer Mother 58       deceased   Prostate cancer Father    Colon polyps Sister    Ovarian cancer Maternal Aunt 68       deceased   Cancer Maternal Aunt        2 other mat aunts; unk. primary in 69s; deceased   Cancer Maternal Uncle        "bone ca"; unk. primary; deceased 78s   Prostate cancer Maternal Uncle        deceased 43   Colon cancer Neg Hx    Esophageal cancer Neg Hx    Rectal cancer Neg Hx    Stomach cancer Neg Hx    Allergies  Allergen Reactions   Codeine Other (See Comments)    "get crazy", "see things that aren't there"   Current Outpatient Medications on File Prior to Visit  Medication Sig Dispense Refill   aspirin 81 MG tablet Take 81 mg by mouth daily as needed (leg pain).     atorvastatin (LIPITOR) 20 MG tablet Take 1 tablet (20 mg total) by mouth daily. 90 tablet 3   Blood Glucose Monitoring Suppl (ONETOUCH VERIO) w/Device KIT Use to check blood  sugar 3 times a day. E11.9 1 kit 0   dapagliflozin propanediol (FARXIGA) 10 MG TABS tablet Take 1 tablet (10 mg total) by mouth daily. 90 tablet 3   gabapentin (NEURONTIN) 300 MG capsule TAKE 2 CAPSULES BY MOUTH AT BEDTIME 60 capsule 0   glucose blood (ONETOUCH VERIO) test strip USE 3x a day as recommended for diabetes type 2,  insulin dependent 300 each 3   ibuprofen (ADVIL) 800 MG tablet Take 800 mg by mouth every 6 (six) hours as needed.     Insulin Pen Needle (BD PEN NEEDLE NANO 2ND GEN) 32G X 4 MM MISC USE 1  TWICE DAILY 200 each 3   letrozole (FEMARA) 2.5 MG tablet Take 1 tablet by mouth once daily 30 tablet 0   lisinopril-hydrochlorothiazide (ZESTORETIC) 20-25 MG tablet Take 1 tablet by mouth daily. 90 tablet 3   metFORMIN (GLUCOPHAGE) 1000 MG tablet Take 1 tablet (1,000 mg total) by mouth 2 (two) times daily with a meal. 180 tablet 3   NON FORMULARY Peripheral Neuropathy cream from [facility]     Omega-3 Fatty Acids (FISH OIL) 1200 MG CAPS Take 1,200 mg by mouth daily.      TRESIBA FLEXTOUCH 100 UNIT/ML FlexTouch Pen Inject 30 Units into the skin daily. 30 mL 3   TRUEPLUS LANCETS 33G MISC 1 each by Does not apply route 3 (three) times daily. 600 each 2   No current facility-administered medications on file prior to visit.    Review of Systems  Constitutional:  Negative for activity change, appetite change, fatigue, fever and unexpected weight change.  HENT:  Negative for congestion, ear pain, rhinorrhea, sinus pressure and sore throat.   Eyes:  Negative for pain, redness and visual disturbance.  Respiratory:  Negative for cough, shortness of breath and wheezing.   Cardiovascular:  Negative for chest pain and palpitations.  Gastrointestinal:  Negative for abdominal pain, blood in stool, constipation and diarrhea.  Endocrine: Negative for polydipsia and polyuria.  Genitourinary:  Negative for dysuria, frequency and urgency.  Musculoskeletal:  Negative for arthralgias, back  pain and myalgias.       Left ankle/foot are still swollen and sore but improved   Skin:  Negative for pallor and rash.  Allergic/Immunologic: Negative for environmental allergies.  Neurological:  Negative for dizziness, syncope and headaches.  Hematological:  Negative for adenopathy. Does not bruise/bleed easily.  Psychiatric/Behavioral:  Negative for decreased concentration and dysphoric mood. The patient is not nervous/anxious.        Objective:   Physical Exam Constitutional:      General: She is not in acute distress.    Appearance: Normal appearance. She is obese. She is not ill-appearing or diaphoretic.  Eyes:     Conjunctiva/sclera: Conjunctivae normal.     Pupils: Pupils are equal, round, and reactive to light.  Cardiovascular:     Rate and Rhythm: Normal rate and regular rhythm.  Musculoskeletal:     Right lower leg: No edema.     Left lower leg: Edema present.     Comments: Mild swelling of left ankle and lateral foot  Some old ecchymosis  Mildly tender at tip of fibula and base of 5th metatarsal  Plantar/dorsi flexion is overall good  Pain with int rotation   No crepitus  No obv instability No warmth   Skin:    Findings: Bruising present. No erythema or rash.  Neurological:     Mental Status: She is alert.     Motor: No weakness.  Psychiatric:        Mood and Affect: Mood normal.           Assessment & Plan:   Problem List Items Addressed This Visit       Musculoskeletal and Integument   Closed fracture of base of fifth metatarsal bone - Primary    Doing well/making progress Less swelling and  pain  Was seen in ortho at [facility] and note was sent for  Reviewed note from Dr Ermalene Searing Reviewed xrays of foot and ankle   Encouraged her to continue Wearing boot for weight bearing  Elevate when able Cold compress when able  Use tylenol for pain (less nsaid due to renal changes)   Call back and Er precautions noted in detail today   Will  follow up with ortho as planned       Closed fracture of right distal fibula    Doing well/making progress Less swelling and pain  Was seen in ortho at [facility] and note was sent for  Reviewed note from Dr Ermalene Searing Reviewed xrays of foot and ankle   Encouraged her to continue Wearing boot for weight bearing  Elevate when able Cold compress when able  Use tylenol for pain (less nsaid due to renal changes)   Call back and Er precautions noted in detail today   Will follow up with ortho as planned

## 2023-09-17 ENCOUNTER — Other Ambulatory Visit: Payer: Self-pay | Admitting: Nurse Practitioner

## 2023-09-17 DIAGNOSIS — C50112 Malignant neoplasm of central portion of left female breast: Secondary | ICD-10-CM

## 2023-09-25 ENCOUNTER — Other Ambulatory Visit: Payer: Self-pay | Admitting: Nurse Practitioner

## 2023-09-25 DIAGNOSIS — C50112 Malignant neoplasm of central portion of left female breast: Secondary | ICD-10-CM

## 2023-09-27 DIAGNOSIS — S92355D Nondisplaced fracture of fifth metatarsal bone, left foot, subsequent encounter for fracture with routine healing: Secondary | ICD-10-CM | POA: Diagnosis not present

## 2023-10-11 DIAGNOSIS — S92355D Nondisplaced fracture of fifth metatarsal bone, left foot, subsequent encounter for fracture with routine healing: Secondary | ICD-10-CM | POA: Diagnosis not present

## 2023-10-14 ENCOUNTER — Encounter: Payer: Self-pay | Admitting: Podiatry

## 2023-10-14 ENCOUNTER — Ambulatory Visit: Payer: No Typology Code available for payment source | Admitting: Podiatry

## 2023-10-14 VITALS — Ht 67.5 in | Wt 222.1 lb

## 2023-10-14 DIAGNOSIS — Z794 Long term (current) use of insulin: Secondary | ICD-10-CM

## 2023-10-14 DIAGNOSIS — Q828 Other specified congenital malformations of skin: Secondary | ICD-10-CM | POA: Diagnosis not present

## 2023-10-14 DIAGNOSIS — M79675 Pain in left toe(s): Secondary | ICD-10-CM

## 2023-10-14 DIAGNOSIS — G620 Drug-induced polyneuropathy: Secondary | ICD-10-CM

## 2023-10-14 DIAGNOSIS — E1165 Type 2 diabetes mellitus with hyperglycemia: Secondary | ICD-10-CM | POA: Diagnosis not present

## 2023-10-14 DIAGNOSIS — M79674 Pain in right toe(s): Secondary | ICD-10-CM

## 2023-10-14 DIAGNOSIS — B351 Tinea unguium: Secondary | ICD-10-CM | POA: Diagnosis not present

## 2023-10-14 DIAGNOSIS — L84 Corns and callosities: Secondary | ICD-10-CM | POA: Diagnosis not present

## 2023-10-14 NOTE — Progress Notes (Signed)
  Subjective:  Patient ID: Lindsey Marquez, female    DOB: [birth year],  MRN: [id number]  Lindsey Marquez presents to clinic today for at risk foot care. Patient has history of drug induced neuropathy and diabetes. Patient relates injury of fracture of left foot after she misstepped. She was treated by Ortho. Patient was placed in a boot for treatment. She subsequently developed wound left ankle and was taken out of the boot. She is now back in her regular shoe gear. Reports no new issues on today's visit.   Chief Complaint  Patient presents with   Nail Problem    Pt is here for [facility], last A1C was 6.7 PCP is Dr Milinda Antis and LOV was in October.   New problem(s): None.   PCP is Tower, Audrie Gallus, MD.  Allergies  Allergen Reactions   Codeine Other (See Comments)    "get crazy", "see things that aren't there"    Review of Systems: Negative except as noted in the HPI.  Objective: No changes noted in today's physical examination. There were no vitals filed for this visit. Lindsey Marquez is a pleasant 72 y.o. female in NAD. AAO x 3.  Neurovascular Examination: Neurovascular status unchanged bilaterally. Capillary refill time to digits immediate b/l. Palpable pedal pulses b/l LE. Pedal hair absent. No pain with calf compression b/l. Lower extremity skin temperature gradient within normal limits. No ischemia or gangrene noted b/l LE. No cyanosis or clubbing noted b/l LE. Trace edema left foot.  Pt has subjective symptoms of neuropathy. Protective sensation intact 5/5 intact bilaterally with 10g monofilament b/l. Vibratory sensation intact b/l.  Dermatological:  Pedal skin is warm and supple b/l LE. No open wounds b/l LE. No interdigital macerations noted b/l LE. Toenails bilateral great toes, left 2nd toe and toes 3-5 b/l elongated, discolored, dystrophic, thickened, crumbly with subungual debris and tenderness to dorsal palpation.   Hyperkeratotic lesion(s) submet head 1 b/l.  No erythema, no  edema, no drainage, no fluctuance.   Musculoskeletal:  Muscle strength 5/5 to all lower extremity muscle groups bilaterally. HAV with bunion deformity noted b/l LE. Hammertoe deformity noted 1-5 b/l.  Assessment/Plan: 1. Pain due to onychomycosis of toenails of both feet   2. Callus   3. Drug-induced polyneuropathy (HCC)   4. Type 2 diabetes mellitus with hyperglycemia, with long-term current use of insulin (HCC)    -Patient was evaluated today. All questions/concerns addressed on today's visit. -Continue foot and shoe inspections daily. Monitor blood glucose per PCP/Endocrinologist's recommendations. -Patient to continue soft, supportive shoe gear daily. -Mycotic toenails 1-5 bilaterally were debrided in length and girth with sterile nail nippers and dremel without incident. -Callus(es) submet head 1 b/l pared utilizing sterile scalpel blade without complication or incident. Total number debrided =2. -Patient/POA to call should there be question/concern in the interim.   Return in about 3 months (around 01/14/2024).  Freddie Breech, DPM

## 2023-10-25 ENCOUNTER — Other Ambulatory Visit: Payer: Self-pay | Admitting: Nurse Practitioner

## 2023-10-25 DIAGNOSIS — C50112 Malignant neoplasm of central portion of left female breast: Secondary | ICD-10-CM

## 2023-11-01 DIAGNOSIS — L03116 Cellulitis of left lower limb: Secondary | ICD-10-CM | POA: Diagnosis not present

## 2023-11-09 ENCOUNTER — Ambulatory Visit: Payer: No Typology Code available for payment source | Admitting: Orthopedic Surgery

## 2023-11-18 ENCOUNTER — Ambulatory Visit (INDEPENDENT_AMBULATORY_CARE_PROVIDER_SITE_OTHER): Payer: No Typology Code available for payment source | Admitting: Orthopedic Surgery

## 2023-11-18 DIAGNOSIS — I872 Venous insufficiency (chronic) (peripheral): Secondary | ICD-10-CM

## 2023-11-21 ENCOUNTER — Encounter: Payer: Self-pay | Admitting: Orthopedic Surgery

## 2023-11-21 ENCOUNTER — Other Ambulatory Visit: Payer: Self-pay | Admitting: Nurse Practitioner

## 2023-11-21 DIAGNOSIS — C50112 Malignant neoplasm of central portion of left female breast: Secondary | ICD-10-CM

## 2023-11-21 NOTE — Progress Notes (Signed)
Office Visit Note   Patient: Lindsey Marquez           Date of Birth: [birth year]           MRN: [id number] Visit Date: 11/18/2023              Requested by: Tower, Audrie Gallus, MD [address] [place],  [place] [address] PCP: Milinda Antis, Audrie Gallus, MD  Chief Complaint  Patient presents with   Left Leg - Wound Check      HPI: Patient is a 72 year old woman who is seen for initial evaluation and referral from Dr. Eulah Pont.  Patient states she has completed a course of antibiotics.  She is status post 1/5 metatarsal fracture that she states she wore a fracture boot.  Patient is status post radiation therapy.  Assessment & Plan: Visit Diagnoses:  1. Venous stasis dermatitis of both lower extremities     Plan: Recommended a size extra-large compression sock for the venous dermatitis.  Follow-Up Instructions: Return in about 2 months (around 01/19/2024).   Ortho Exam  Patient is alert, oriented, no adenopathy, well-dressed, normal affect, normal respiratory effort. Examination patient is a good dorsalis pedis pulse.  She has venous insufficiency with dermatitis no open ulcers no cellulitis.  The calf is 43 cm in circumference.  Hemoglobin A1c 7.5.  Imaging: No results found. No images are attached to the encounter.  Labs: Lab Results  Component Value Date   HGBA1C 7.5 (A) 09/02/2023   HGBA1C 7.0 (A) 02/17/2023   HGBA1C 6.2 (A) 01/19/2022     Lab Results  Component Value Date   ALBUMIN 4.4 08/04/2023   ALBUMIN 4.0 01/28/2023   ALBUMIN 4.3 08/06/2022    No results found for: "MG" Lab Results  Component Value Date   VD25OH 25.20 (L) 04/15/2020   VD25OH 25.09 (L) 12/20/2018   VD25OH 25.40 (L) 01/04/2018    No results found for: "PREALBUMIN"    Latest Ref Rng & Units 08/04/2023   11:22 AM 04/22/2023    2:20 PM 01/28/2023   11:36 AM  CBC EXTENDED  WBC 4.0 - 10.5 K/uL 7.8  9.7  7.8   RBC 3.87 - 5.11 MIL/uL 3.49  3.38  3.16   Hemoglobin 12.0 - 15.0 g/dL 19.1  47.8   29.5   HCT 36.0 - 46.0 % 35.0  32.9  31.3   Platelets 150 - 400 K/uL 336  345  342.0   NEUT# 1.7 - 7.7 K/uL 5.0  5,568  4.9   Lymph# 0.7 - 4.0 K/uL 2.0  3,114  2.1      There is no height or weight on file to calculate BMI.  Orders:  No orders of the defined types were placed in this encounter.  No orders of the defined types were placed in this encounter.    Procedures: No procedures performed  Clinical Data: No additional findings.  ROS:  All other systems negative, except as noted in the HPI. Review of Systems  Objective: Vital Signs: There were no vitals taken for this visit.  Specialty Comments:  No specialty comments available.  PMFS History: Patient Active Problem List   Diagnosis Date Noted   Closed fracture of right distal fibula 08/25/2023   Closed fracture of base of fifth metatarsal bone 08/25/2023   Personal history of breast cancer 04/12/2023   Routine general medical examination at a health care facility 01/28/2023   Venous insufficiency 01/28/2023   Normocytic anemia 01/28/2023   Bilateral  hip pain 07/17/2021   CKD (chronic kidney disease) stage 3, GFR 30-59 ml/min (HCC) 06/05/2021   Screening for malignant neoplasm of colon 02/17/2021   History of colonic polyps 11/18/2020   Drug-induced polyneuropathy (HCC) 04/15/2020   Osteoarthritis 04/15/2020   Hyperlipidemia associated with type 2 diabetes mellitus (HCC) 09/20/2018   Class 1 obesity due to excess calories with body mass index (BMI) of 34.0 to 34.9 in adult 09/20/2018   Obesity (BMI 30.0-34.9) 05/18/2016   Cancer of central portion of left breast (HCC) 05/07/2016   HTN (hypertension) 03/24/2016   Type 2 diabetes mellitus with hyperglycemia, with long-term current use of insulin (HCC) 03/24/2016   Past Medical History:  Diagnosis Date   Blood transfusion without reported diagnosis    Breast cancer (HCC) 06/2001   Invasive ductal carcinoma Left breast. 2002, 2017   Diabetes mellitus  without complication (HCC)    Difficult intravenous access    Family history of malignant neoplasm of ovary    Family history of pancreatic cancer    Hypertension     Family History  Problem Relation Age of Onset   Pancreatic cancer Mother 51       deceased   Prostate cancer Father    Colon polyps Sister    Ovarian cancer Maternal Aunt 5       deceased   Cancer Maternal Aunt        2 other mat aunts; unk. primary in 40s; deceased   Cancer Maternal Uncle        "bone ca"; unk. primary; deceased 46s   Prostate cancer Maternal Uncle        deceased 51   Colon cancer Neg Hx    Esophageal cancer Neg Hx    Rectal cancer Neg Hx    Stomach cancer Neg Hx     Past Surgical History:  Procedure Laterality Date   BREAST SURGERY Left 2003   COLONOSCOPY     FOOT SURGERY  2000   INSERTION OF MESH N/A 02/17/2018   Procedure: INSERTION OF MESH;  Surgeon: Manus Rudd, MD;  Location: [facility] OR;  Service: General;  Laterality: N/A;   MASTECTOMY Left 05/28/2016   port a cath insertion  05/28/2016   PORT-A-CATH REMOVAL Right 08/19/2017   Procedure: REMOVAL PORT-A-CATH;  Surgeon: Manus Rudd, MD;  Location: [facility];  Service: General;  Laterality: Right;   PORTACATH PLACEMENT Right 05/28/2016   Procedure: INSERTION PORT-A-CATH;  Surgeon: Manus Rudd, MD;  Location: [facility] OR;  Service: General;  Laterality: Right;   TOTAL MASTECTOMY Left 05/28/2016   Procedure: LEFT  MASTECTOMY;  Surgeon: Manus Rudd, MD;  Location: [facility] OR;  Service: General;  Laterality: Left;   TUBAL LIGATION  22 yrs since  2004.   Bilateral.   VENTRAL HERNIA REPAIR  02/17/2018   open   VENTRAL HERNIA REPAIR N/A 02/17/2018   Procedure: OPEN VENTRAL HERNIA REPAIR WITH MESH;  Surgeon: Manus Rudd, MD;  Location: [facility] OR;  Service: General;  Laterality: N/A;   Social History   Occupational History   Not on file  Tobacco Use   Smoking status: Former    Current packs/day: 0.00    Average packs/day: 0.1  packs/day for 7.0 years (0.7 ttl pk-yrs)    Types: Cigarettes    Start date: 11/30/1968    Quit date: 12/01/1975    Years since quitting: 48.0   Smokeless tobacco: Never  Vaping Use   Vaping status: Never Used  Substance and Sexual Activity  Alcohol use: No    Alcohol/week: 0.0 standard drinks of alcohol   Drug use: No   Sexual activity: Yes

## 2023-12-27 ENCOUNTER — Other Ambulatory Visit: Payer: Self-pay | Admitting: Internal Medicine

## 2023-12-27 ENCOUNTER — Other Ambulatory Visit: Payer: Self-pay | Admitting: Nurse Practitioner

## 2023-12-27 DIAGNOSIS — C50112 Malignant neoplasm of central portion of left female breast: Secondary | ICD-10-CM

## 2023-12-27 NOTE — Telephone Encounter (Signed)
Lipitor refill request complete

## 2023-12-28 ENCOUNTER — Other Ambulatory Visit: Payer: Self-pay | Admitting: Nurse Practitioner

## 2023-12-28 DIAGNOSIS — C50112 Malignant neoplasm of central portion of left female breast: Secondary | ICD-10-CM

## 2023-12-29 ENCOUNTER — Other Ambulatory Visit: Payer: Self-pay

## 2024-01-05 ENCOUNTER — Ambulatory Visit: Payer: Medicare PPO | Admitting: Internal Medicine

## 2024-01-17 ENCOUNTER — Ambulatory Visit: Payer: No Typology Code available for payment source | Admitting: Podiatry

## 2024-01-20 ENCOUNTER — Ambulatory Visit: Payer: No Typology Code available for payment source | Admitting: Orthopedic Surgery

## 2024-01-24 ENCOUNTER — Other Ambulatory Visit: Payer: No Typology Code available for payment source

## 2024-01-24 ENCOUNTER — Encounter: Payer: Self-pay | Admitting: Podiatry

## 2024-01-24 ENCOUNTER — Ambulatory Visit (INDEPENDENT_AMBULATORY_CARE_PROVIDER_SITE_OTHER): Payer: No Typology Code available for payment source | Admitting: Podiatry

## 2024-01-24 VITALS — Ht 67.5 in | Wt 222.1 lb

## 2024-01-24 DIAGNOSIS — Z794 Long term (current) use of insulin: Secondary | ICD-10-CM | POA: Diagnosis not present

## 2024-01-24 DIAGNOSIS — B351 Tinea unguium: Secondary | ICD-10-CM | POA: Diagnosis not present

## 2024-01-24 DIAGNOSIS — M79675 Pain in left toe(s): Secondary | ICD-10-CM

## 2024-01-24 DIAGNOSIS — E1165 Type 2 diabetes mellitus with hyperglycemia: Secondary | ICD-10-CM

## 2024-01-24 DIAGNOSIS — Q828 Other specified congenital malformations of skin: Secondary | ICD-10-CM | POA: Diagnosis not present

## 2024-01-24 DIAGNOSIS — G620 Drug-induced polyneuropathy: Secondary | ICD-10-CM

## 2024-01-24 DIAGNOSIS — M79674 Pain in right toe(s): Secondary | ICD-10-CM

## 2024-01-24 NOTE — Progress Notes (Signed)
  Subjective:  Patient ID: Lindsey Marquez, female    DOB: Sep 17, 1951,  MRN: [id number]  73 y.o. female presents at risk foot care with h/o neuropathy secondary to chemotherapy and painful porokeratotic lesion(s) of both feet and painful mycotic toenails that limit ambulation. Painful toenails interfere with ambulation. Aggravating factors include wearing enclosed shoe gear. Pain is relieved with periodic professional debridement. Painful porokeratotic lesions are aggravated when weightbearing with and without shoegear. Pain is relieved with periodic professional debridement.  Chief Complaint  Patient presents with   Nail Problem    Pt is here for [facility] last A1C was 6.7 PCP is Dr Milinda Antis and LOV was in October.    New problem(s): None   PCP is Tower, Lindsey Gallus, MD   Allergies  Allergen Reactions   Codeine Other (See Comments)    "get crazy", "see things that aren't there"    Review of Systems: Negative except as noted in the HPI.   Objective:  BETHA SHADIX is a pleasant 73 y.o. female in NAD. AAO x 3.  Vascular Examination: Vascular status intact b/l with palpable pedal pulses. CFT immediate b/l. Palpable pedal pulses b/l LE. Pedal hair absent. No pain with calf compression b/l. Lower extremity skin temperature gradient within normal limits. No ischemia or gangrene noted b/l LE. No cyanosis or clubbing noted b/l LE. Trace edema left foot.  Neurological Examination: Sensation grossly intact b/l with 10 gram monofilament. Vibratory sensation intact b/l. Pt has subjective symptoms of neuropathy.  Dermatological Examination: Pedal skin with normal turgor, texture and tone b/l. No open wounds nor interdigital macerations noted. Toenails 1-5 b/l thick, discolored, elongated with subungual debris and pain on dorsal palpation. Porokeratotic lesion(s) L 5th toe and submet head 4 right foot. No erythema, no edema, no drainage, no fluctuance.   Musculoskeletal Examination: Muscle strength 5/5  to b/l LE.  HAV with bunion deformity noted b/l LE. Hammertoe deformity noted 1-5 b/l.  Radiographs: None  Last A1c:      Latest Ref Rng & Units 09/02/2023    3:21 PM 02/17/2023   11:24 AM  Hemoglobin A1C  Hemoglobin-A1c 4.0 - 5.6 % 7.5  7.0    Assessment:   1. Pain due to onychomycosis of toenails of both feet   2. Porokeratosis   3. Drug-induced polyneuropathy (HCC)   4. Type 2 diabetes mellitus with hyperglycemia, with long-term current use of insulin (HCC)    Plan:  -Consent given for treatment as described below: -Examined patient. -Continue supportive shoe gear daily. -Toenails 1-5 b/l were debrided in length and girth with sterile nail nippers and dremel without iatrogenic bleeding.  -Porokeratotic lesion(s) L 5th toe and submet head 4 right foot pared and enucleated with sterile currette without incident. Total number of lesions debrided=2. -Patient/POA to call should there be question/concern in the interim.  Return in about 3 months (around 04/22/2024).  Freddie Breech, DPM      [facility] LOCATION: [address], [place] [address]                   Office [phone]   [facility] LOCATION: [address] [place], [place] [address] Office [phone]

## 2024-01-27 ENCOUNTER — Ambulatory Visit (INDEPENDENT_AMBULATORY_CARE_PROVIDER_SITE_OTHER): Payer: No Typology Code available for payment source | Admitting: Orthopedic Surgery

## 2024-01-27 DIAGNOSIS — I872 Venous insufficiency (chronic) (peripheral): Secondary | ICD-10-CM | POA: Diagnosis not present

## 2024-01-29 ENCOUNTER — Other Ambulatory Visit: Payer: Self-pay | Admitting: Nurse Practitioner

## 2024-01-29 DIAGNOSIS — C50112 Malignant neoplasm of central portion of left female breast: Secondary | ICD-10-CM

## 2024-01-31 ENCOUNTER — Encounter: Payer: No Typology Code available for payment source | Admitting: Family Medicine

## 2024-02-03 ENCOUNTER — Other Ambulatory Visit: Payer: Self-pay

## 2024-02-03 ENCOUNTER — Other Ambulatory Visit: Payer: Self-pay | Admitting: Nurse Practitioner

## 2024-02-03 DIAGNOSIS — C50112 Malignant neoplasm of central portion of left female breast: Secondary | ICD-10-CM

## 2024-02-04 ENCOUNTER — Other Ambulatory Visit: Payer: Self-pay | Admitting: Nurse Practitioner

## 2024-02-04 DIAGNOSIS — C50112 Malignant neoplasm of central portion of left female breast: Secondary | ICD-10-CM

## 2024-02-05 ENCOUNTER — Encounter: Payer: Self-pay | Admitting: Orthopedic Surgery

## 2024-02-05 NOTE — Progress Notes (Signed)
 Office Visit Note   Patient: Lindsey Marquez           Date of Birth: [birth year]           MRN: [id number] Visit Date: 01/27/2024              Requested by: Tower, Audrie Gallus, MD [address] [place],  [place] [address] PCP: Milinda Antis, Audrie Gallus, MD  Chief Complaint  Patient presents with   Left Leg - Follow-up      HPI: Patient is a 73 year old woman with left lower extremity venous insufficiency ulceration.  She is wearing compression socks.  She states she has brawny skin color changes and itching at times.  Assessment & Plan: Visit Diagnoses:  1. Venous stasis dermatitis of both lower extremities     Plan: Recommended protein supplement compression exercise and elevation.  Follow-Up Instructions: Return if symptoms worsen or fail to improve.   Ortho Exam  Patient is alert, oriented, no adenopathy, well-dressed, normal affect, normal respiratory effort. Examination patient is a good dorsalis pedis pulse she has brawny skin color changes and no ulcerations there is no cellulitis.  No evidence of DVT.  Imaging: No results found. No images are attached to the encounter.  Labs: Lab Results  Component Value Date   HGBA1C 7.5 (A) 09/02/2023   HGBA1C 7.0 (A) 02/17/2023   HGBA1C 6.2 (A) 01/19/2022     Lab Results  Component Value Date   ALBUMIN 4.4 08/04/2023   ALBUMIN 4.0 01/28/2023   ALBUMIN 4.3 08/06/2022    No results found for: "MG" Lab Results  Component Value Date   VD25OH 25.20 (L) 04/15/2020   VD25OH 25.09 (L) 12/20/2018   VD25OH 25.40 (L) 01/04/2018    No results found for: "PREALBUMIN"    Latest Ref Rng & Units 08/04/2023   11:22 AM 04/22/2023    2:20 PM 01/28/2023   11:36 AM  CBC EXTENDED  WBC 4.0 - 10.5 K/uL 7.8  9.7  7.8   RBC 3.87 - 5.11 MIL/uL 3.49  3.38  3.16   Hemoglobin 12.0 - 15.0 g/dL 25.8  52.7  78.2   HCT 36.0 - 46.0 % 35.0  32.9  31.3   Platelets 150 - 400 K/uL 336  345  342.0   NEUT# 1.7 - 7.7 K/uL 5.0  5,568  4.9   Lymph#  0.7 - 4.0 K/uL 2.0  3,114  2.1      There is no height or weight on file to calculate BMI.  Orders:  No orders of the defined types were placed in this encounter.  No orders of the defined types were placed in this encounter.    Procedures: No procedures performed  Clinical Data: No additional findings.  ROS:  All other systems negative, except as noted in the HPI. Review of Systems  Objective: Vital Signs: There were no vitals taken for this visit.  Specialty Comments:  No specialty comments available.  PMFS History: Patient Active Problem List   Diagnosis Date Noted   Closed fracture of right distal fibula 08/25/2023   Closed fracture of base of fifth metatarsal bone 08/25/2023   Personal history of breast cancer 04/12/2023   Routine general medical examination at a health care facility 01/28/2023   Venous insufficiency 01/28/2023   Normocytic anemia 01/28/2023   Bilateral hip pain 07/17/2021   CKD (chronic kidney disease) stage 3, GFR 30-59 ml/min (HCC) 06/05/2021   Screening for malignant neoplasm of colon 02/17/2021  History of colonic polyps 11/18/2020   Drug-induced polyneuropathy (HCC) 04/15/2020   Osteoarthritis 04/15/2020   Hyperlipidemia associated with type 2 diabetes mellitus (HCC) 09/20/2018   Class 1 obesity due to excess calories with body mass index (BMI) of 34.0 to 34.9 in adult 09/20/2018   Obesity (BMI 30.0-34.9) 05/18/2016   Cancer of central portion of left breast (HCC) 05/07/2016   HTN (hypertension) 03/24/2016   Type 2 diabetes mellitus with hyperglycemia, with long-term current use of insulin (HCC) 03/24/2016   Past Medical History:  Diagnosis Date   Blood transfusion without reported diagnosis    Breast cancer (HCC) 06/2001   Invasive ductal carcinoma Left breast. 2002, 2017   Diabetes mellitus without complication (HCC)    Difficult intravenous access    Family history of malignant neoplasm of ovary    Family history of pancreatic  cancer    Hypertension     Family History  Problem Relation Age of Onset   Pancreatic cancer Mother 32       deceased   Prostate cancer Father    Colon polyps Sister    Ovarian cancer Maternal Aunt 26       deceased   Cancer Maternal Aunt        2 other mat aunts; unk. primary in 87s; deceased   Cancer Maternal Uncle        "bone ca"; unk. primary; deceased 60s   Prostate cancer Maternal Uncle        deceased 69   Colon cancer Neg Hx    Esophageal cancer Neg Hx    Rectal cancer Neg Hx    Stomach cancer Neg Hx     Past Surgical History:  Procedure Laterality Date   BREAST SURGERY Left 2003   COLONOSCOPY     FOOT SURGERY  2000   INSERTION OF MESH N/A 02/17/2018   Procedure: INSERTION OF MESH;  Surgeon: Manus Rudd, MD;  Location: [facility] OR;  Service: General;  Laterality: N/A;   MASTECTOMY Left 05/28/2016   port a cath insertion  05/28/2016   PORT-A-CATH REMOVAL Right 08/19/2017   Procedure: REMOVAL PORT-A-CATH;  Surgeon: Manus Rudd, MD;  Location: [facility];  Service: General;  Laterality: Right;   PORTACATH PLACEMENT Right 05/28/2016   Procedure: INSERTION PORT-A-CATH;  Surgeon: Manus Rudd, MD;  Location: [facility] OR;  Service: General;  Laterality: Right;   TOTAL MASTECTOMY Left 05/28/2016   Procedure: LEFT  MASTECTOMY;  Surgeon: Manus Rudd, MD;  Location: [facility] OR;  Service: General;  Laterality: Left;   TUBAL LIGATION  22 yrs since  2004.   Bilateral.   VENTRAL HERNIA REPAIR  02/17/2018   open   VENTRAL HERNIA REPAIR N/A 02/17/2018   Procedure: OPEN VENTRAL HERNIA REPAIR WITH MESH;  Surgeon: Manus Rudd, MD;  Location: [facility] OR;  Service: General;  Laterality: N/A;   Social History   Occupational History   Not on file  Tobacco Use   Smoking status: Former    Current packs/day: 0.00    Average packs/day: 0.1 packs/day for 7.0 years (0.7 ttl pk-yrs)    Types: Cigarettes    Start date: 11/30/1968    Quit date: 12/01/1975    Years since quitting: 48.2    Smokeless tobacco: Never  Vaping Use   Vaping status: Never Used  Substance and Sexual Activity   Alcohol use: No    Alcohol/week: 0.0 standard drinks of alcohol   Drug use: No   Sexual activity: Yes

## 2024-02-17 ENCOUNTER — Encounter: Payer: Self-pay | Admitting: Family Medicine

## 2024-02-17 ENCOUNTER — Ambulatory Visit: Payer: No Typology Code available for payment source | Admitting: Family Medicine

## 2024-02-17 VITALS — BP 122/65 | HR 91 | Temp 99.0°F | Ht 67.75 in | Wt 223.1 lb

## 2024-02-17 DIAGNOSIS — E1165 Type 2 diabetes mellitus with hyperglycemia: Secondary | ICD-10-CM | POA: Diagnosis not present

## 2024-02-17 DIAGNOSIS — Z1211 Encounter for screening for malignant neoplasm of colon: Secondary | ICD-10-CM

## 2024-02-17 DIAGNOSIS — G620 Drug-induced polyneuropathy: Secondary | ICD-10-CM

## 2024-02-17 DIAGNOSIS — E1169 Type 2 diabetes mellitus with other specified complication: Secondary | ICD-10-CM | POA: Diagnosis not present

## 2024-02-17 DIAGNOSIS — I872 Venous insufficiency (chronic) (peripheral): Secondary | ICD-10-CM

## 2024-02-17 DIAGNOSIS — E785 Hyperlipidemia, unspecified: Secondary | ICD-10-CM

## 2024-02-17 DIAGNOSIS — E66811 Obesity, class 1: Secondary | ICD-10-CM | POA: Diagnosis not present

## 2024-02-17 DIAGNOSIS — N1831 Chronic kidney disease, stage 3a: Secondary | ICD-10-CM

## 2024-02-17 DIAGNOSIS — Z853 Personal history of malignant neoplasm of breast: Secondary | ICD-10-CM

## 2024-02-17 DIAGNOSIS — I10 Essential (primary) hypertension: Secondary | ICD-10-CM

## 2024-02-17 DIAGNOSIS — Z794 Long term (current) use of insulin: Secondary | ICD-10-CM | POA: Diagnosis not present

## 2024-02-17 DIAGNOSIS — E6609 Other obesity due to excess calories: Secondary | ICD-10-CM

## 2024-02-17 DIAGNOSIS — Z0000 Encounter for general adult medical examination without abnormal findings: Secondary | ICD-10-CM | POA: Diagnosis not present

## 2024-02-17 LAB — LIPID PANEL
Cholesterol: 125 mg/dL (ref 0–200)
HDL: 50.9 mg/dL (ref >39.00)
LDL Cholesterol: 41 mg/dL (ref 0–99)
NonHDL: 73.83
Total CHOL/HDL Ratio: 2
Triglycerides: 165 mg/dL — ABNORMAL HIGH (ref 0.0–149.0)
VLDL: 33 mg/dL (ref 0.0–40.0)

## 2024-02-17 LAB — COMPREHENSIVE METABOLIC PANEL
ALT: 15 U/L (ref 0–35)
AST: 17 U/L (ref 0–37)
Albumin: 4.3 g/dL (ref 3.5–5.2)
Alkaline Phosphatase: 71 U/L (ref 39–117)
BUN: 23 mg/dL (ref 6–23)
CO2: 25 meq/L (ref 19–32)
Calcium: 9.7 mg/dL (ref 8.4–10.5)
Chloride: 106 meq/L (ref 96–112)
Creatinine, Ser: 1.02 mg/dL (ref 0.40–1.20)
GFR: 54.88 mL/min — ABNORMAL LOW (ref >60.00)
Glucose, Bld: 103 mg/dL — ABNORMAL HIGH (ref 70–99)
Potassium: 4.7 meq/L (ref 3.5–5.1)
Sodium: 140 meq/L (ref 135–145)
Total Bilirubin: 0.4 mg/dL (ref 0.2–1.2)
Total Protein: 7.5 g/dL (ref 6.0–8.3)

## 2024-02-17 LAB — CBC WITH DIFFERENTIAL/PLATELET
Basophils Absolute: 0.1 10*3/uL (ref 0.0–0.1)
Basophils Relative: 0.9 % (ref 0.0–3.0)
Eosinophils Absolute: 0.3 10*3/uL (ref 0.0–0.7)
Eosinophils Relative: 2.9 % (ref 0.0–5.0)
HCT: 35.7 % — ABNORMAL LOW (ref 36.0–46.0)
Hemoglobin: 11.7 g/dL — ABNORMAL LOW (ref 12.0–15.0)
Lymphocytes Relative: 26.3 % (ref 12.0–46.0)
Lymphs Abs: 2.3 10*3/uL (ref 0.7–4.0)
MCHC: 32.7 g/dL (ref 30.0–36.0)
MCV: 101.7 fl — ABNORMAL HIGH (ref 78.0–100.0)
Monocytes Absolute: 0.5 10*3/uL (ref 0.1–1.0)
Monocytes Relative: 5.7 % (ref 3.0–12.0)
Neutro Abs: 5.6 10*3/uL (ref 1.4–7.7)
Neutrophils Relative %: 64.2 % (ref 43.0–77.0)
Platelets: 401 10*3/uL — ABNORMAL HIGH (ref 150.0–400.0)
RBC: 3.51 Mil/uL — ABNORMAL LOW (ref 3.87–5.11)
RDW: 13.5 % (ref 11.5–15.5)
WBC: 8.8 10*3/uL (ref 4.0–10.5)

## 2024-02-17 LAB — TSH: TSH: 2.22 u[IU]/mL (ref 0.35–5.50)

## 2024-02-17 NOTE — Progress Notes (Signed)
 Subjective:    Patient ID: Lindsey Marquez, female    DOB: 07/24/1951, 73 y.o.   MRN: [id number]  HPI  Here for health maintenance exam and to review chronic medical problems   Wt Readings from Last 3 Encounters:  02/17/24 223 lb 2 oz (101.2 kg)  01/24/24 222 lb 2.1 oz (100.8 kg)  10/14/23 222 lb 2.1 oz (100.8 kg)   34.18 kg/m  Vitals:   02/17/24 1138 02/17/24 1204  BP: (!) 146/62 122/65  Pulse: 91   Temp: 99 F (37.2 C)   SpO2: 97%     Immunization History  Administered Date(s) Administered   Fluad Quad(high Dose 65+) 08/03/2019, 08/29/2020, 08/14/2022   Influenza, High Dose Seasonal PF 09/14/2017, 09/03/2021, 08/19/2023   Influenza,inj,Quad PF,6+ Mos 09/07/2016, 09/29/2018   Influenza-Unspecified 10/31/2015   PFIZER(Purple Top)SARS-COV-2 Vaccination 01/25/2020, 02/20/2020, 10/09/2020   Pneumococcal Conjugate-13 10/04/2017   Pneumococcal Polysaccharide-23 01/27/2016   Td 12/26/2021   Tdap 09/08/2012   Unspecified SARS-COV-2 Vaccination 08/19/2023   Zoster Recombinant(Shingrix) 04/24/2020, 07/10/2020   Zoster, Live 09/08/2012    There are no preventive care reminders to display for this patient.   Mammogram 08/2023  Personal history of breast cancer On letrozole - tolerates it well overall  Self breast exam-no changes   Gyn health  No problems   Colon cancer screening  Colonoscopy 08/2022 with 3 y recall    Bone health  Dexa 07/2022 -normal bmd  Falls- missed a step walking and talking to daughter  Benito Mccreedy / wore a boot   Supplements - taking ca and D  Last vitamin D Lab Results  Component Value Date   VD25OH 25.20 (L) 04/15/2020    Exercise  Is back to the gym  Legs feel better when she exercises   Wears compression hose    Mood    02/17/2024   12:12 PM 09/07/2023   11:47 AM 07/14/2023    2:35 PM 04/16/2022    1:39 PM 12/25/2021   12:09 PM  Depression screen PHQ 2/9  Decreased Interest 0 0 0 0 0  Down, Depressed, Hopeless 0 0 0  0 0  PHQ - 2 Score 0 0 0 0 0  Altered sleeping 1 0     Tired, decreased energy 1 1     Change in appetite 0 0     Feeling bad or failure about yourself  0 0     Trouble concentrating 0 0     Moving slowly or fidgety/restless 0 0     Suicidal thoughts 0 0     PHQ-9 Score 2 1     Difficult doing work/chores Not difficult at all Not difficult at all      HTN bp is stable today  No cp or palpitations or headaches or edema  No side effects to medicines  BP Readings from Last 3 Encounters:  02/17/24 122/65  09/07/23 126/70  09/02/23 (!) 146/60     Lisinopril hct 20-25 mg daily   Ckd   Lab Results  Component Value Date   NA 141 08/04/2023   K 4.5 08/04/2023   CO2 28 08/04/2023   GLUCOSE 141 (H) 08/04/2023   BUN 28 (H) 08/04/2023   CREATININE 1.22 (H) 08/04/2023   CALCIUM 9.4 08/04/2023   GFR 60.98 01/28/2023   EGFR >60 09/21/2017   GFRNONAA 47 (L) 08/04/2023  Takes farxiga  Also metformin    Lab Results  Component Value Date   WBC 7.8 08/04/2023   HGB  12.0 08/04/2023   HCT 35.0 (L) 08/04/2023   MCV 100.3 (H) 08/04/2023   PLT 336 08/04/2023     DM2 Lab Results  Component Value Date   HGBA1C 7.5 (A) 09/02/2023   HGBA1C 7.0 (A) 02/17/2023   HGBA1C 6.2 (A) 01/19/2022   Under endo care  Metformin 1000 mg bid Tresiba 30 u daily  Farxiga 10 mg daily   Microalb ratio 13 in the fall   Hyperlipidemia Lab Results  Component Value Date   CHOL 102 01/28/2023   HDL 50.80 01/28/2023   LDLCALC 27 01/28/2023   LDLDIRECT 113.0 04/15/2020   TRIG 120.0 01/28/2023   CHOLHDL 2 01/28/2023   Atorvastatin 20 mg dialy   Polyneuropathy from medication  Gabapentin 600 mg at bedtime   Patient Active Problem List   Diagnosis Date Noted   Closed fracture of right distal fibula 08/25/2023   Closed fracture of base of fifth metatarsal bone 08/25/2023   Personal history of breast cancer 04/12/2023   Routine general medical examination at a health care facility 01/28/2023    Venous insufficiency 01/28/2023   Normocytic anemia 01/28/2023   Bilateral hip pain 07/17/2021   CKD (chronic kidney disease) stage 3, GFR 30-59 ml/min (HCC) 06/05/2021   Screening for malignant neoplasm of colon 02/17/2021   History of colonic polyps 11/18/2020   Drug-induced polyneuropathy (HCC) 04/15/2020   Osteoarthritis 04/15/2020   Hyperlipidemia associated with type 2 diabetes mellitus (HCC) 09/20/2018   Class 1 obesity due to excess calories with body mass index (BMI) of 34.0 to 34.9 in adult 09/20/2018   Obesity (BMI 30.0-34.9) 05/18/2016   Cancer of central portion of left breast (HCC) 05/07/2016   HTN (hypertension) 03/24/2016   Type 2 diabetes mellitus with hyperglycemia, with long-term current use of insulin (HCC) 03/24/2016   Past Medical History:  Diagnosis Date   Blood transfusion without reported diagnosis    Breast cancer (HCC) 06/2001   Invasive ductal carcinoma Left breast. 2002, 2017   Diabetes mellitus without complication (HCC)    Difficult intravenous access    Family history of malignant neoplasm of ovary    Family history of pancreatic cancer    Hypertension    Past Surgical History:  Procedure Laterality Date   BREAST SURGERY Left 2003   COLONOSCOPY     FOOT SURGERY  2000   INSERTION OF MESH N/A 02/17/2018   Procedure: INSERTION OF MESH;  Surgeon: Manus Rudd, MD;  Location: [facility] OR;  Service: General;  Laterality: N/A;   MASTECTOMY Left 05/28/2016   port a cath insertion  05/28/2016   PORT-A-CATH REMOVAL Right 08/19/2017   Procedure: REMOVAL PORT-A-CATH;  Surgeon: Manus Rudd, MD;  Location: [facility];  Service: General;  Laterality: Right;   PORTACATH PLACEMENT Right 05/28/2016   Procedure: INSERTION PORT-A-CATH;  Surgeon: Manus Rudd, MD;  Location: [facility] OR;  Service: General;  Laterality: Right;   TOTAL MASTECTOMY Left 05/28/2016   Procedure: LEFT  MASTECTOMY;  Surgeon: Manus Rudd, MD;  Location: [facility] OR;  Service: General;   Laterality: Left;   TUBAL LIGATION  22 yrs since  2004.   Bilateral.   VENTRAL HERNIA REPAIR  02/17/2018   open   VENTRAL HERNIA REPAIR N/A 02/17/2018   Procedure: OPEN VENTRAL HERNIA REPAIR WITH MESH;  Surgeon: Manus Rudd, MD;  Location: [facility] OR;  Service: General;  Laterality: N/A;   Social History   Tobacco Use   Smoking status: Former    Current packs/day: 0.00  Average packs/day: 0.1 packs/day for 7.0 years (0.7 ttl pk-yrs)    Types: Cigarettes    Start date: 11/30/1968    Quit date: 12/01/1975    Years since quitting: 48.2   Smokeless tobacco: Never  Vaping Use   Vaping status: Never Used  Substance Use Topics   Alcohol use: No    Alcohol/week: 0.0 standard drinks of alcohol   Drug use: No   Family History  Problem Relation Age of Onset   Pancreatic cancer Mother 29       deceased   Prostate cancer Father    Colon polyps Sister    Ovarian cancer Maternal Aunt 86       deceased   Cancer Maternal Aunt        2 other mat aunts; unk. primary in 76s; deceased   Cancer Maternal Uncle        "bone ca"; unk. primary; deceased 53s   Prostate cancer Maternal Uncle        deceased 1   Colon cancer Neg Hx    Esophageal cancer Neg Hx    Rectal cancer Neg Hx    Stomach cancer Neg Hx    Allergies  Allergen Reactions   Codeine Other (See Comments)    "get crazy", "see things that aren't there"   Current Outpatient Medications on File Prior to Visit  Medication Sig Dispense Refill   aspirin 81 MG tablet Take 81 mg by mouth daily as needed (leg pain).     atorvastatin (LIPITOR) 20 MG tablet Take 1 tablet by mouth once daily 90 tablet 0   Blood Glucose Monitoring Suppl (ONETOUCH VERIO) w/Device KIT Use to check blood sugar 3 times a day. E11.9 1 kit 0   dapagliflozin propanediol (FARXIGA) 10 MG TABS tablet Take 1 tablet (10 mg total) by mouth daily. 90 tablet 3   gabapentin (NEURONTIN) 300 MG capsule TAKE 2 CAPSULES BY MOUTH AT BEDTIME 60 capsule 3   glucose blood  (ONETOUCH VERIO) test strip USE 3x a day as recommended for diabetes type 2, insulin dependent 300 each 3   ibuprofen (ADVIL) 800 MG tablet Take 800 mg by mouth every 6 (six) hours as needed.     Insulin Pen Needle (BD PEN NEEDLE NANO 2ND GEN) 32G X 4 MM MISC USE 1  TWICE DAILY 200 each 3   letrozole (FEMARA) 2.5 MG tablet Take 1 tablet by mouth once daily 30 tablet 0   lisinopril-hydrochlorothiazide (ZESTORETIC) 20-25 MG tablet Take 1 tablet by mouth daily. 90 tablet 3   metFORMIN (GLUCOPHAGE) 1000 MG tablet Take 1 tablet (1,000 mg total) by mouth 2 (two) times daily with a meal. 180 tablet 3   NON FORMULARY Peripheral Neuropathy cream from [facility]     Omega-3 Fatty Acids (FISH OIL) 1200 MG CAPS Take 1,200 mg by mouth daily.      TRESIBA FLEXTOUCH 100 UNIT/ML FlexTouch Pen Inject 30 Units into the skin daily. 30 mL 3   TRUEPLUS LANCETS 33G MISC 1 each by Does not apply route 3 (three) times daily. 600 each 2   No current facility-administered medications on file prior to visit.    Review of Systems  Constitutional:  Negative for activity change, appetite change, fatigue, fever and unexpected weight change.  HENT:  Negative for congestion, ear pain, rhinorrhea, sinus pressure and sore throat.   Eyes:  Negative for pain, redness and visual disturbance.  Respiratory:  Negative for cough, shortness of breath and wheezing.  Cardiovascular:  Negative for chest pain and palpitations.  Gastrointestinal:  Negative for abdominal pain, blood in stool, constipation and diarrhea.  Endocrine: Negative for polydipsia and polyuria.  Genitourinary:  Negative for dysuria, frequency and urgency.  Musculoskeletal:  Positive for arthralgias. Negative for back pain and myalgias.  Skin:  Negative for pallor and rash.  Allergic/Immunologic: Negative for environmental allergies.  Neurological:  Negative for dizziness, syncope and headaches.  Hematological:  Negative for adenopathy. Does not  bruise/bleed easily.  Psychiatric/Behavioral:  Negative for decreased concentration and dysphoric mood. The patient is not nervous/anxious.        Objective:   Physical Exam Constitutional:      General: She is not in acute distress.    Appearance: Normal appearance. She is well-developed. She is obese. She is not ill-appearing or diaphoretic.  HENT:     Head: Normocephalic and atraumatic.     Right Ear: Tympanic membrane, ear canal and external ear normal.     Left Ear: Tympanic membrane, ear canal and external ear normal.     Nose: Nose normal. No congestion.     Mouth/Throat:     Mouth: Mucous membranes are moist.     Pharynx: Oropharynx is clear. No posterior oropharyngeal erythema.  Eyes:     General: No scleral icterus.    Extraocular Movements: Extraocular movements intact.     Conjunctiva/sclera: Conjunctivae normal.     Pupils: Pupils are equal, round, and reactive to light.  Neck:     Thyroid: No thyromegaly.     Vascular: No carotid bruit or JVD.  Cardiovascular:     Rate and Rhythm: Normal rate and regular rhythm.     Pulses: Normal pulses.     Heart sounds: Normal heart sounds.     No gallop.     Comments: Signs of venous insuff in lower legs with skin scale and thickening  Worse on the left  Pulmonary:     Effort: Pulmonary effort is normal. No respiratory distress.     Breath sounds: Normal breath sounds. No wheezing.     Comments: Good air exch Chest:     Chest wall: No tenderness.  Abdominal:     General: Bowel sounds are normal. There is no distension or abdominal bruit.     Palpations: Abdomen is soft. There is no mass.     Tenderness: There is no abdominal tenderness.     Hernia: No hernia is present.  Genitourinary:    Comments: Breast exam done by oncology Musculoskeletal:        General: No tenderness. Normal range of motion.     Cervical back: Normal range of motion and neck supple. No rigidity. No muscular tenderness.     Right lower leg: No  edema.     Left lower leg: No edema.     Comments: No kyphosis   Lymphadenopathy:     Cervical: No cervical adenopathy.  Skin:    General: Skin is warm and dry.     Coloration: Skin is not pale.     Findings: No erythema or rash.     Comments: Few scattered sks   Neurological:     Mental Status: She is alert. Mental status is at baseline.     Cranial Nerves: No cranial nerve deficit.     Motor: No abnormal muscle tone.     Coordination: Coordination normal.     Gait: Gait normal.     Deep Tendon Reflexes: Reflexes are normal and symmetric.  Reflexes normal.  Psychiatric:        Mood and Affect: Mood normal.        Cognition and Memory: Cognition and memory normal.           Assessment & Plan:   Problem List Items Addressed This Visit       Cardiovascular and Mediastinum   Venous insufficiency   Worsened after foot fracture Encouraged to continue compression socks       HTN (hypertension)   bp in fair control at this time  BP Readings from Last 1 Encounters:  02/17/24 122/65   No changes needed Most recent labs reviewed  Disc lifstyle change with low sodium diet and exercise  Plan to continue lisinoprol hct 20-25 mg once daily  Labs ordered      Relevant Orders   CBC with Differential/Platelet   Comprehensive metabolic panel   TSH   Lipid Panel     Endocrine   Type 2 diabetes mellitus with hyperglycemia, with long-term current use of insulin (HCC)   Under endocrinology care Per pt stable Metformin 1000 mg bid Resiba 30 u daily  Farxiga 10 mg daily  Microalb ratio normal in the fall      Hyperlipidemia associated with type 2 diabetes mellitus (HCC)   Disc goals for lipids and reasons to control them Rev last labs with pt Rev low sat fat diet in detail Labs ordered  Now taking atorvastatin 20 mg daily  Goald LDL under 70 with dm 2       Relevant Orders   Comprehensive metabolic panel   Lipid Panel     Nervous and Auditory   Drug-induced  polyneuropathy (HCC)   Continues gabapentin 600 mg at bedtime Is helpful        Genitourinary   CKD (chronic kidney disease) stage 3, GFR 30-59 ml/min (HCC)   Bmet today  Taking farxiga for DM Encouraged fluids  Avoid nsaids         Other   Screening for malignant neoplasm of colon   Due for next colonoscopy oct 2025       Routine general medical examination at a health care facility - Primary   Reviewed health habits including diet and exercise and skin cancer prevention Reviewed appropriate screening tests for age  Also reviewed health mt list, fam hx and immunization status , as well as social and family history   See HPI Labs reviewed and ordered Health Maintenance  Topic Date Due   COVID-19 Vaccine (5 - Pfizer risk 2024-25 season) 03/04/2025*   Hemoglobin A1C  03/02/2024   Complete foot exam   04/11/2024   Eye exam for diabetics  06/14/2024   Yearly kidney function blood test for diabetes  08/03/2024   Medicare Annual Wellness Visit  08/31/2024   Yearly kidney health urinalysis for diabetes  09/01/2024   Mammogram  08/31/2025   Colon Cancer Screening  09/23/2025   DTaP/Tdap/Td vaccine (3 - Td or Tdap) 12/27/2031   Flu Shot  Completed   DEXA scan (bone density measurement)  Completed   Hepatitis C Screening  Completed   Zoster (Shingles) Vaccine  Completed   HPV Vaccine  Aged Out   Pneumonia Vaccine  Discontinued  *Topic was postponed. The date shown is not the original due date.   Sees onc for breast care  Colonsocopy due in oct-ptis aware  Normal bmd dexa 07/2022   taking femara  Discussed fall prevention, supplements and exercise for bone density  One  fracture/foot this year PHQ 1        Personal history of breast cancer   Continues onc care Taking letrazole       Class 1 obesity due to excess calories with body mass index (BMI) of 34.0 to 34.9 in adult   Discussed how this problem influences overall health and the risks it imposes  Reviewed plan for  weight loss with lower calorie diet (via better food choices (lower glycemic and portion control) along with exercise building up to or more than 30 minutes 5 days per week including some aerobic activity and strength training

## 2024-02-17 NOTE — Assessment & Plan Note (Signed)
 Disc goals for lipids and reasons to control them Rev last labs with pt Rev low sat fat diet in detail Labs ordered  Now taking atorvastatin 20 mg daily  Goald LDL under 70 with dm 2

## 2024-02-17 NOTE — Patient Instructions (Addendum)
 I think you are due for colonoscopy in the fall /oct  If you don't get a reminder let us know   For bone health Try to get 1200-1500 mg of calcium per day with at least 2000 iu of vitamin D - for bone health  Labs today   Keep going to the gym  Make sure you include strength training - machines or weight  Core strength it important also     Eat a healthy diet  Try to get most of your carbohydrates from produce (with the exception of white potatoes) and whole grains Eat less bread/pasta/rice/snack foods/cereals/sweets and other items from the middle of the grocery store (processed carbs)

## 2024-02-17 NOTE — Assessment & Plan Note (Signed)
 Continues onc care Taking letrazole

## 2024-02-17 NOTE — Assessment & Plan Note (Signed)
 Under endocrinology care Per pt stable Metformin 1000 mg bid Resiba 30 u daily  Farxiga 10 mg daily  Microalb ratio normal in the fall

## 2024-02-17 NOTE — Assessment & Plan Note (Signed)
 Reviewed health habits including diet and exercise and skin cancer prevention Reviewed appropriate screening tests for age  Also reviewed health mt list, fam hx and immunization status , as well as social and family history   See HPI Labs reviewed and ordered Health Maintenance  Topic Date Due   COVID-19 Vaccine (5 - Pfizer risk 2024-25 season) 03/04/2025*   Hemoglobin A1C  03/02/2024   Complete foot exam   04/11/2024   Eye exam for diabetics  06/14/2024   Yearly kidney function blood test for diabetes  08/03/2024   Medicare Annual Wellness Visit  08/31/2024   Yearly kidney health urinalysis for diabetes  09/01/2024   Mammogram  08/31/2025   Colon Cancer Screening  09/23/2025   DTaP/Tdap/Td vaccine (3 - Td or Tdap) 12/27/2031   Flu Shot  Completed   DEXA scan (bone density measurement)  Completed   Hepatitis C Screening  Completed   Zoster (Shingles) Vaccine  Completed   HPV Vaccine  Aged Out   Pneumonia Vaccine  Discontinued  *Topic was postponed. The date shown is not the original due date.   Sees onc for breast care  Colonsocopy due in oct-ptis aware  Normal bmd dexa 07/2022   taking femara  Discussed fall prevention, supplements and exercise for bone density  One fracture/foot this year PHQ 1

## 2024-02-17 NOTE — Addendum Note (Signed)
 Addended by: Roxy Manns A on: 02/17/2024 05:06 PM   Modules accepted: Orders

## 2024-02-17 NOTE — Assessment & Plan Note (Signed)
 Continues gabapentin 600 mg at bedtime Is helpful

## 2024-02-17 NOTE — Assessment & Plan Note (Signed)
 Due for next colonoscopy oct 2025

## 2024-02-17 NOTE — Assessment & Plan Note (Signed)
 bp in fair control at this time  BP Readings from Last 1 Encounters:  02/17/24 122/65   No changes needed Most recent labs reviewed  Disc lifstyle change with low sodium diet and exercise  Plan to continue lisinoprol hct 20-25 mg once daily  Labs ordered

## 2024-02-17 NOTE — Assessment & Plan Note (Signed)
 Worsened after foot fracture Encouraged to continue compression socks

## 2024-02-17 NOTE — Assessment & Plan Note (Signed)
 Bmet today  Taking farxiga for DM Encouraged fluids  Avoid nsaids

## 2024-02-17 NOTE — Assessment & Plan Note (Signed)
 Discussed how this problem influences overall health and the risks it imposes  Reviewed plan for weight loss with lower calorie diet (via better food choices (lower glycemic and portion control) along with exercise building up to or more than 30 minutes 5 days per week including some aerobic activity and strength training

## 2024-02-24 ENCOUNTER — Ambulatory Visit: Payer: No Typology Code available for payment source

## 2024-02-24 DIAGNOSIS — M2142 Flat foot [pes planus] (acquired), left foot: Secondary | ICD-10-CM

## 2024-02-24 DIAGNOSIS — E1165 Type 2 diabetes mellitus with hyperglycemia: Secondary | ICD-10-CM

## 2024-02-24 DIAGNOSIS — L84 Corns and callosities: Secondary | ICD-10-CM

## 2024-03-03 ENCOUNTER — Other Ambulatory Visit: Payer: Self-pay | Admitting: Hematology

## 2024-03-03 DIAGNOSIS — C50112 Malignant neoplasm of central portion of left female breast: Secondary | ICD-10-CM

## 2024-03-07 ENCOUNTER — Encounter: Payer: Self-pay | Admitting: Internal Medicine

## 2024-03-07 ENCOUNTER — Ambulatory Visit (INDEPENDENT_AMBULATORY_CARE_PROVIDER_SITE_OTHER): Payer: Medicare PPO | Admitting: Internal Medicine

## 2024-03-07 VITALS — BP 120/70 | HR 89 | Ht 67.75 in | Wt 220.4 lb

## 2024-03-07 DIAGNOSIS — E785 Hyperlipidemia, unspecified: Secondary | ICD-10-CM | POA: Diagnosis not present

## 2024-03-07 DIAGNOSIS — E1169 Type 2 diabetes mellitus with other specified complication: Secondary | ICD-10-CM

## 2024-03-07 DIAGNOSIS — E1165 Type 2 diabetes mellitus with hyperglycemia: Secondary | ICD-10-CM | POA: Diagnosis not present

## 2024-03-07 DIAGNOSIS — Z794 Long term (current) use of insulin: Secondary | ICD-10-CM

## 2024-03-07 DIAGNOSIS — E66812 Obesity, class 2: Secondary | ICD-10-CM

## 2024-03-07 LAB — POCT GLYCOSYLATED HEMOGLOBIN (HGB A1C): Hemoglobin A1C: 7.1 % — AB (ref 4.0–5.6)

## 2024-03-07 MED ORDER — DAPAGLIFLOZIN PROPANEDIOL 10 MG PO TABS: 10.0000 mg | ORAL_TABLET | Freq: Every day | ORAL | 11 refills | Status: DC

## 2024-03-07 MED ORDER — EMPAGLIFLOZIN 25 MG PO TABS: 25.0000 mg | ORAL_TABLET | Freq: Every day | ORAL | 11 refills | Status: DC

## 2024-03-07 NOTE — Progress Notes (Signed)
 Patient ID: Lindsey Marquez, female   DOB: Feb 18, 1951, 73 y.o.   MRN: [id number]   HPI: Lindsey Marquez is a 73 y.o.-year-old female, returning for follow-up for DM2, dx in ~2001, insulin-dependent, uncontrolled, without long-term complications. Last visit 6 months ago.  Interim history: No increased urination, blurry vision, nausea, chest pain. 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  On letrozole. She fractured her L foot 2 weeks prior to our last visit when visiting her daughter in college.  She recovered well.  Reviewed HbA1c levels: 09/02/2023: HbA1c calculated from fructosamine is 6.47%, lower than the directly measured HbA1c. Lab Results  Component Value Date   HGBA1C 7.5 (A) 09/02/2023   HGBA1C 7.0 (A) 02/17/2023   HGBA1C 6.2 (A) 01/19/2022   HGBA1C 6.6 (A) 09/17/2021   HGBA1C 6.9 (A) 06/12/2021   HGBA1C 9.1 (A) 03/04/2021   HGBA1C 7.9 (A) 10/28/2020   HGBA1C 8.4 (H) 04/15/2020   HGBA1C 10.4 (A) 10/04/2019   HGBA1C 9.8 (H) 12/20/2018   HGBA1C 9.4 (A) 09/20/2018   HGBA1C 8.8 (H) 04/05/2018   HGBA1C 7.5 (H) 02/10/2018   HGBA1C 7.4 (H) 01/04/2018   HGBA1C 8.0 (H) 10/04/2017   HGBA1C 6.9 (H) 12/28/2016   HGBA1C 8.0 (H) 09/24/2016   HGBA1C 7.8 (H) 05/26/2016   HGBA1C 9.1 01/27/2016   Pt is on: - Basaglar 20 >> 30 >> 34-36 >> 20 >> Tresiba 30 units at bedtime - Metformin 1000 mg 2x a day with meals - Farxiga 10 mg daily before b'fast  GLP1 R agonists were not affordable.  Pt checks her sugars 2-3 a day per review of her log: - am 86-122 >> 93-136, 149, 154 >> 113-124, 135, 148, 199 - 2h after b'fast: n/c - before lunch:  90s-101 >> n/c >> 93-114 >> 103-155, 188 - 2h after lunch: 84-143, 190 >> 85, 99-158 >> 172, 179 >> 200 - before dinner: 180-195 >> 200s >> 103-145, 174 >> n/c  - 2h after dinner: 114-166, 189, 199 >> 100-194 >> 104-160 - bedtime:  115-137, 182 >> 92-124, 146 >> n/c - nighttime: n/c Lowest sugar was 85 >> 93 >> 103; she has  hypoglycemia awareness in the 70s. Highest sugar was 199 >> 194 >> 199.  Glucometer: True metrix >> One Touch Verio  Pt's meals are: - Breakfast:coffee, mc muffin, egg - Lunch: [place] sandwich - Dinner: baked chicken, veggies,dessert - apples, - Snacks: yoghurt, pretzels, PB  - + Mild CKD, last BUN/creatinine:  Lab Results  Component Value Date   BUN 23 02/17/2024   BUN 28 (H) 08/04/2023   CREATININE 1.02 02/17/2024   CREATININE 1.22 (H) 08/04/2023   Lab Results  Component Value Date   MICRALBCREAT 1.3 09/02/2023  On lisinopril 10.  -+ HL; last set of lipids: Lab Results  Component Value Date   CHOL 125 02/17/2024   HDL 50.90 02/17/2024   LDLCALC 41 02/17/2024   LDLDIRECT 113.0 04/15/2020   TRIG 165.0 (H) 02/17/2024   CHOLHDL 2 02/17/2024  On Lipitor 10 >> 20 mg, fish oil.  - last eye exam was 06/15/2023:  No DR reportedly.  - no numbness and tingling in her feet.  Last foot exam was per Dr. Eloy End 01/24/2024.  She fractured the fifth L metatarsal in 08/2023. On ASA 81.  Pt has FH of DM in brother, sister, father.  + h/o BrCA in 2013, recurrence in 2018.  She is cancer free.    ROS: + See HPI  Current  Outpatient Medications on File Prior to Visit  Medication Sig   aspirin 81 MG tablet Take 81 mg by mouth daily as needed (leg pain).   atorvastatin (LIPITOR) 20 MG tablet Take 1 tablet by mouth once daily   Blood Glucose Monitoring Suppl (ONETOUCH VERIO) w/Device KIT Use to check blood sugar 3 times a day. E11.9   dapagliflozin propanediol (FARXIGA) 10 MG TABS tablet Take 1 tablet (10 mg total) by mouth daily.   gabapentin (NEURONTIN) 300 MG capsule TAKE 2 CAPSULES BY MOUTH AT BEDTIME   glucose blood (ONETOUCH VERIO) test strip USE 3x a day as recommended for diabetes type 2, insulin dependent   ibuprofen (ADVIL) 800 MG tablet Take 800 mg by mouth every 6 (six) hours as needed.   Insulin Pen Needle (BD PEN NEEDLE NANO 2ND GEN) 32G X 4 MM MISC USE 1  TWICE DAILY    letrozole (FEMARA) 2.5 MG tablet Take 1 tablet by mouth once daily   lisinopril-hydrochlorothiazide (ZESTORETIC) 20-25 MG tablet Take 1 tablet by mouth daily.   metFORMIN (GLUCOPHAGE) 1000 MG tablet Take 1 tablet (1,000 mg total) by mouth 2 (two) times daily with a meal.   NON FORMULARY Peripheral Neuropathy cream from [facility]   Omega-3 Fatty Acids (FISH OIL) 1200 MG CAPS Take 1,200 mg by mouth daily.    TRESIBA FLEXTOUCH 100 UNIT/ML FlexTouch Pen Inject 30 Units into the skin daily.   TRUEPLUS LANCETS 33G MISC 1 each by Does not apply route 3 (three) times daily.   No current facility-administered medications on file prior to visit.   Past Medical History:  Diagnosis Date   Blood transfusion without reported diagnosis    Breast cancer (HCC) 06/2001   Invasive ductal carcinoma Left breast. 2002, 2017   Diabetes mellitus without complication (HCC)    Difficult intravenous access    Family history of malignant neoplasm of ovary    Family history of pancreatic cancer    Hypertension    Past Surgical History:  Procedure Laterality Date   BREAST SURGERY Left 2003   COLONOSCOPY     FOOT SURGERY  2000   INSERTION OF MESH N/A 02/17/2018   Procedure: INSERTION OF MESH;  Surgeon: Manus Rudd, MD;  Location: [facility] OR;  Service: General;  Laterality: N/A;   MASTECTOMY Left 05/28/2016   port a cath insertion  05/28/2016   PORT-A-CATH REMOVAL Right 08/19/2017   Procedure: REMOVAL PORT-A-CATH;  Surgeon: Manus Rudd, MD;  Location: [facility];  Service: General;  Laterality: Right;   PORTACATH PLACEMENT Right 05/28/2016   Procedure: INSERTION PORT-A-CATH;  Surgeon: Manus Rudd, MD;  Location: [facility] OR;  Service: General;  Laterality: Right;   TOTAL MASTECTOMY Left 05/28/2016   Procedure: LEFT  MASTECTOMY;  Surgeon: Manus Rudd, MD;  Location: [facility] OR;  Service: General;  Laterality: Left;   TUBAL LIGATION  22 yrs since  2004.   Bilateral.   VENTRAL HERNIA REPAIR   02/17/2018   open   VENTRAL HERNIA REPAIR N/A 02/17/2018   Procedure: OPEN VENTRAL HERNIA REPAIR WITH MESH;  Surgeon: Manus Rudd, MD;  Location: [facility] OR;  Service: General;  Laterality: N/A;   Social History   Socioeconomic History   Marital status: Divorced    Spouse name: Not on file   Number of children: Not on file   Years of education: Not on file   Highest education level: Not on file  Occupational History   Not on file  Tobacco Use   Smoking  status: Former    Current packs/day: 0.00    Average packs/day: 0.1 packs/day for 7.0 years (0.7 ttl pk-yrs)    Types: Cigarettes    Start date: 11/30/1968    Quit date: 12/01/1975    Years since quitting: 48.2   Smokeless tobacco: Never  Vaping Use   Vaping status: Never Used  Substance and Sexual Activity   Alcohol use: No    Alcohol/week: 0.0 standard drinks of alcohol   Drug use: No   Sexual activity: Yes  Other Topics Concern   Not on file  Social History Narrative   Not on file   Social Drivers of Health   Financial Resource Strain: Low Risk  (07/14/2023)   Overall Financial Resource Strain (CARDIA)    Difficulty of Paying Living Expenses: Not hard at all  Food Insecurity: No Food Insecurity (07/14/2023)   Hunger Vital Sign    Worried About Running Out of Food in the Last Year: Never true    Ran Out of Food in the Last Year: Never true  Transportation Needs: No Transportation Needs (07/14/2023)   PRAPARE - Administrator, Civil Service (Medical): No    Lack of Transportation (Non-Medical): No  Physical Activity: Insufficiently Active (07/14/2023)   Exercise Vital Sign    Days of Exercise per Week: 3 days    Minutes of Exercise per Session: 20 min  Stress: No Stress Concern Present (07/14/2023)   [employer] of Occupational Health - Occupational Stress Questionnaire    Feeling of Stress : Not at all  Social Connections: Moderately Integrated (07/14/2023)   Social Connection and Isolation Panel [NHANES]     Frequency of Communication with Friends and Family: More than three times a week    Frequency of Social Gatherings with Friends and Family: More than three times a week    Attends Religious Services: More than 4 times per year    Active Member of [employer] or Organizations: Yes    Attends Engineer, structural: More than 4 times per year    Marital Status: Divorced  Intimate Partner Violence: Not At Risk (07/14/2023)   Humiliation, Afraid, Rape, and Kick questionnaire    Fear of Current or Ex-Partner: No    Emotionally Abused: No    Physically Abused: No    Sexually Abused: No    Allergies  Allergen Reactions   Codeine Other (See Comments)    "get crazy", "see things that aren't there"   Family History  Problem Relation Age of Onset   Pancreatic cancer Mother 29       deceased   Prostate cancer Father    Colon polyps Sister    Ovarian cancer Maternal Aunt 26       deceased   Cancer Maternal Aunt        2 other mat aunts; unk. primary in 30s; deceased   Cancer Maternal Uncle        "bone ca"; unk. primary; deceased 74s   Prostate cancer Maternal Uncle        deceased 82   Colon cancer Neg Hx    Esophageal cancer Neg Hx    Rectal cancer Neg Hx    Stomach cancer Neg Hx    PE:  BP 120/70   Pulse 89   Ht 5' 7.75" (1.721 m)   Wt 220 lb 6.4 oz (100 kg)   SpO2 95%   BMI 33.76 kg/m  Wt Readings from Last 10 Encounters:  02/17/24 223 lb  2 oz (101.2 kg)  01/24/24 222 lb 2.1 oz (100.8 kg)  10/14/23 222 lb 2.1 oz (100.8 kg)  09/07/23 222 lb 2 oz (100.8 kg)  09/02/23 224 lb 3.2 oz (101.7 kg)  08/24/23 223 lb 6 oz (101.3 kg)  08/04/23 222 lb 1.6 oz (100.7 kg)  07/14/23 220 lb (99.8 kg)  02/17/23 218 lb 9.6 oz (99.2 kg)  01/28/23 226 lb (102.5 kg)   Constitutional: overweight, in NAD Eyes:  EOMI, no exophthalmos ENT: no neck masses, no cervical lymphadenopathy Cardiovascular: RRR, No MRG Respiratory: CTA B Musculoskeletal: no deformities Skin:no  rashes Neurological: no tremor with outstretched hands  ASSESSMENT: 1. DM2, insulin-dependent, uncontrolled, without long-term complications, but with hyperglycemia  2. HL  3.  Obesity class II  PLAN:  1. Patient with longstanding, uncontrolled, type 2 diabetes, on oral antidiabetic regimen with metformin, SGLT2, with a higher HbA1c at last visit, of 7.5%, however, with the HbA1c calculated from fructosamine of 6.47%, correlating better with the blood sugars at home.  These were mostly at goal.  We did not change her regimen at that time.  I sent a prescription to refill her test strips. - at today's visit, sugars are slightly better but still with values above target throughout the day. Upon Q'ing, she is sipping on protein shakes throughout the day >> rec'd to stop. OTW, we can continue the same regimen - her medications are more expensive, likely 2/2 deductible. I advised her to check if generic Marcelline Deist or London Pepper are cheaper. Given written Rx for both. - I suggested to: Patient Instructions  Please continue: - Metformin 1000 mg 2x a day with meals - Farxiga 10 mg daily before b'fast/Jardiance 25 mg before b'fast - Tresiba 30 units at bedtime  Please stop at the lab.  Please come back for a follow-up appointment in 4 months.  - we checked her HbA1c: 7.1% (lower)  - advised to check sugars at different times of the day - 1x a day, rotating check times - advised for yearly eye exams >> she is UTD - her kidney function was slightly low at last check.  PCP recommended to stop ibuprofen - she was using a lot around the time of her foot fracture.  At today's visit we will recheck her ACR. - return to clinic in 4 months  2. HL - Latest lipid panel showed fractions at goal: Lab Results  Component Value Date   CHOL 125 02/17/2024   HDL 50.90 02/17/2024   LDLCALC 41 02/17/2024   LDLDIRECT 113.0 04/15/2020   TRIG 165.0 (H) 02/17/2024   CHOLHDL 2 02/17/2024  - She can use Lipitor 20  mg daily and fish oil, without side effects  3.  Obesity class II - Will continue [place] which should also help with weight loss - She gained 10 pounds before the last visits combined - weight is ~ stable since then  Carlus Pavlov, MD PhD [facility]

## 2024-03-07 NOTE — Patient Instructions (Addendum)
 Please continue: - Metformin 1000 mg 2x a day with meals - Farxiga 10 mg daily before b'fast/Jardiance 25 mg before b'fast - Tresiba 30 units at bedtime  Please stop at the lab.  Please come back for a follow-up appointment in 4 months.

## 2024-03-08 NOTE — Progress Notes (Signed)
 Patient presents to the office today for diabetic shoe and insole measuring.  Patient was measured with brannock device to determine size and width for 1 pair of extra depth shoes and foam casted for 3 pair of insoles.   Documentation of medical necessity will be sent to patient's treating diabetic doctor to verify and sign.   Patient's diabetic provider: Shella Maxim   Shoes and insoles will be ordered at that time and patient will be notified for an appointment for fitting when they arrive.   Shoe size (per patient): 10-11 Brannock measurement: 10 Shoe choice:   [id number]  Shoe size ordered: 11WD also have a 10.5 set aside for patient  ABN and ppw signed

## 2024-03-09 DIAGNOSIS — E1165 Type 2 diabetes mellitus with hyperglycemia: Secondary | ICD-10-CM | POA: Diagnosis not present

## 2024-03-09 DIAGNOSIS — Z794 Long term (current) use of insulin: Secondary | ICD-10-CM | POA: Diagnosis not present

## 2024-03-10 ENCOUNTER — Encounter: Payer: Self-pay | Admitting: Internal Medicine

## 2024-03-10 ENCOUNTER — Other Ambulatory Visit: Payer: Self-pay | Admitting: Internal Medicine

## 2024-03-10 LAB — MICROALBUMIN / CREATININE URINE RATIO
Creatinine, Urine: 40 mg/dL (ref 20–275)
Microalb Creat Ratio: 35 mg/g{creat} — ABNORMAL HIGH (ref <30)
Microalb, Ur: 1.4 mg/dL

## 2024-03-21 ENCOUNTER — Encounter: Payer: Self-pay | Admitting: Family Medicine

## 2024-03-21 ENCOUNTER — Other Ambulatory Visit (INDEPENDENT_AMBULATORY_CARE_PROVIDER_SITE_OTHER)

## 2024-03-21 DIAGNOSIS — N1831 Chronic kidney disease, stage 3a: Secondary | ICD-10-CM | POA: Diagnosis not present

## 2024-03-21 LAB — BASIC METABOLIC PANEL WITH GFR
BUN: 23 mg/dL (ref 6–23)
CO2: 26 meq/L (ref 19–32)
Calcium: 9.6 mg/dL (ref 8.4–10.5)
Chloride: 101 meq/L (ref 96–112)
Creatinine, Ser: 0.98 mg/dL (ref 0.40–1.20)
GFR: 57.54 mL/min — ABNORMAL LOW (ref >60.00)
Glucose, Bld: 99 mg/dL (ref 70–99)
Potassium: 4.6 meq/L (ref 3.5–5.1)
Sodium: 136 meq/L (ref 135–145)

## 2024-03-24 ENCOUNTER — Telehealth: Payer: Self-pay

## 2024-03-24 NOTE — Telephone Encounter (Signed)
 Appt set to PU shoes 4/28 11:30 Ppw exp. 7/11  Kerney Pee

## 2024-03-27 ENCOUNTER — Other Ambulatory Visit

## 2024-03-28 ENCOUNTER — Ambulatory Visit (INDEPENDENT_AMBULATORY_CARE_PROVIDER_SITE_OTHER)

## 2024-03-28 DIAGNOSIS — M2141 Flat foot [pes planus] (acquired), right foot: Secondary | ICD-10-CM

## 2024-03-28 DIAGNOSIS — M2041 Other hammer toe(s) (acquired), right foot: Secondary | ICD-10-CM

## 2024-03-28 DIAGNOSIS — L84 Corns and callosities: Secondary | ICD-10-CM | POA: Diagnosis not present

## 2024-03-28 DIAGNOSIS — E1140 Type 2 diabetes mellitus with diabetic neuropathy, unspecified: Secondary | ICD-10-CM

## 2024-03-28 DIAGNOSIS — E1165 Type 2 diabetes mellitus with hyperglycemia: Secondary | ICD-10-CM

## 2024-03-28 NOTE — Progress Notes (Signed)
 Patient presents today to pick up diabetic shoes and insoles.  Patient was dispensed 1 pair of diabetic shoes and 3 pairs of total contact diabetic insoles. Patient wanted more room in shoes therefore we dispensed Heat Moldable inserts, inserts were heated and molded to patient foot Fit was satisfactory. Instructions for break-in and wear was reviewed and a copy was given to the patient.   Re-appointment for regularly scheduled diabetic foot care visits or if they should experience any trouble with the shoes or insoles.  Britton Cane Cped, CFo, CFm

## 2024-03-29 ENCOUNTER — Other Ambulatory Visit: Payer: Self-pay | Admitting: Nurse Practitioner

## 2024-03-29 ENCOUNTER — Other Ambulatory Visit: Payer: Self-pay | Admitting: Internal Medicine

## 2024-03-29 DIAGNOSIS — C50112 Malignant neoplasm of central portion of left female breast: Secondary | ICD-10-CM

## 2024-05-01 ENCOUNTER — Encounter: Payer: Self-pay | Admitting: Podiatry

## 2024-05-01 ENCOUNTER — Ambulatory Visit (INDEPENDENT_AMBULATORY_CARE_PROVIDER_SITE_OTHER): Payer: No Typology Code available for payment source | Admitting: Podiatry

## 2024-05-01 VITALS — Ht 67.75 in | Wt 220.4 lb

## 2024-05-01 DIAGNOSIS — M79674 Pain in right toe(s): Secondary | ICD-10-CM

## 2024-05-01 DIAGNOSIS — G620 Drug-induced polyneuropathy: Secondary | ICD-10-CM

## 2024-05-01 DIAGNOSIS — M79675 Pain in left toe(s): Secondary | ICD-10-CM

## 2024-05-01 DIAGNOSIS — B351 Tinea unguium: Secondary | ICD-10-CM

## 2024-05-02 ENCOUNTER — Other Ambulatory Visit: Payer: Self-pay | Admitting: Hematology

## 2024-05-02 DIAGNOSIS — C50112 Malignant neoplasm of central portion of left female breast: Secondary | ICD-10-CM

## 2024-05-04 ENCOUNTER — Encounter: Payer: Self-pay | Admitting: Podiatry

## 2024-05-04 NOTE — Progress Notes (Signed)
  Subjective:  Patient ID: Lindsey Marquez, female    DOB: December 16, 1950,  MRN: [id number]  Lindsey Marquez presents to clinic today for at risk foot care with h/o neuropathy secondary to chemotherapy and painful mycotic toenails x 10 which interfere with daily activities. Pain is relieved with periodic professional debridement.  Patient states she has been applying moisturizer to her feet daily. Chief Complaint  Patient presents with   Nail Problem    Pt is here for [facility] last A1c was 6 PCP is Dr Malissa Se and LOV was in March.   New problem(s): None.   PCP is Tower, Manley Seeds, MD.  Allergies  Allergen Reactions   Codeine Other (See Comments)    "get crazy", "see things that aren't there"    Review of Systems: Negative except as noted in the HPI.  Objective: No changes noted in today's physical examination. There were no vitals filed for this visit. Lindsey Marquez is a pleasant 73 y.o. female in NAD. AAO x 3.  Vascular Examination: Capillary refill time immediate b/l. Vascular status intact b/l with palpable pedal pulses. No pain with calf compression b/l. Skin temperature gradient WNL b/l. No cyanosis or clubbing b/l. No ischemia or gangrene noted b/l.   Neurological Examination: Sensation grossly intact b/l with 10 gram monofilament. Vibratory sensation intact b/l. Pt has subjective symptoms of neuropathy.  Dermatological Examination: Pedal skin with normal turgor, texture and tone b/l.  No open wounds. No interdigital macerations.   Toenails 1-5 b/l thick, discolored, elongated with subungual debris and pain on dorsal palpation.   No hyperkeratotic nor porokeratotic lesions present on today's visit.  Musculoskeletal Examination: Muscle strength 5/5 to all lower extremity muscle groups bilaterally. HAV with bunion deformity noted b/l LE. Hammertoe deformity noted 1-5 b/l.  Radiographs: None  Last A1c:      Latest Ref Rng & Units 03/07/2024   11:11 AM 09/02/2023    3:21 PM   Hemoglobin A1C  Hemoglobin-A1c 4.0 - 5.6 % 7.1  7.5    Assessment/Plan: 1. Pain due to onychomycosis of toenails of both feet   2. Chemotherapy-induced neuropathy (HCC)   Patient was evaluated and treated. All patient's and/or POA's questions/concerns addressed on today's visit. Mycotic toenails 1-5 debrided in length and girth without incident. Continue soft, supportive shoe gear daily. Report any pedal injuries to medical professional. Call office if there are any quesitons/concerns. -Patient/POA to call should there be question/concern in the interim.   Return in about 3 months (around 08/01/2024).  Lindsey Marquez, DPM      [place] LOCATION: [address] [place] [address]                   Office [phone]   [facility] LOCATION: [address] [place], [place] [address] Office [phone]

## 2024-05-25 ENCOUNTER — Other Ambulatory Visit: Payer: Self-pay | Admitting: Family Medicine

## 2024-06-05 ENCOUNTER — Other Ambulatory Visit: Payer: Self-pay | Admitting: Nurse Practitioner

## 2024-06-05 DIAGNOSIS — C50112 Malignant neoplasm of central portion of left female breast: Secondary | ICD-10-CM

## 2024-07-10 DIAGNOSIS — H40013 Open angle with borderline findings, low risk, bilateral: Secondary | ICD-10-CM | POA: Diagnosis not present

## 2024-07-10 DIAGNOSIS — E119 Type 2 diabetes mellitus without complications: Secondary | ICD-10-CM | POA: Diagnosis not present

## 2024-07-10 LAB — HM DIABETES EYE EXAM

## 2024-07-11 ENCOUNTER — Ambulatory Visit: Admitting: Internal Medicine

## 2024-07-17 ENCOUNTER — Other Ambulatory Visit: Payer: Self-pay | Admitting: Hematology

## 2024-07-17 ENCOUNTER — Other Ambulatory Visit: Payer: Self-pay | Admitting: Internal Medicine

## 2024-07-17 DIAGNOSIS — C50112 Malignant neoplasm of central portion of left female breast: Secondary | ICD-10-CM

## 2024-07-28 ENCOUNTER — Other Ambulatory Visit: Payer: Self-pay

## 2024-07-28 ENCOUNTER — Other Ambulatory Visit: Payer: Self-pay | Admitting: Family Medicine

## 2024-07-28 DIAGNOSIS — C50112 Malignant neoplasm of central portion of left female breast: Secondary | ICD-10-CM

## 2024-07-28 DIAGNOSIS — Z1231 Encounter for screening mammogram for malignant neoplasm of breast: Secondary | ICD-10-CM

## 2024-07-28 DIAGNOSIS — D649 Anemia, unspecified: Secondary | ICD-10-CM

## 2024-07-31 NOTE — Progress Notes (Unsigned)
 [facility]   [phone] [phone]    Patient Care Team: Tower, Lindsey Marquez LABOR, MD as PCP - General (Family Medicine) Lindsey Marquez, DPM as Consulting Physician (Podiatry) Lindsey File, MD as Consulting Physician (Endocrinology) Lindsey Mayme POUR, NP as Nurse Practitioner (Hematology and Oncology)   CHIEF COMPLAINT: Follow up recurrent left breast cancer   Oncology History Overview Note  Cancer of central portion of left breast [facility])   Staging form: Breast, AJCC 7th Edition     Clinical stage from 04/17/2016: Stage IA (T1c, N0, M0) - Signed by Lindsey Mattock, MD on 05/07/2016     Pathologic stage from 05/28/2016: Stage IIA (T2, N0, cM0) - Signed by Lindsey Mattock, MD on 06/11/2016 History of left breast cancer   Staging form: Breast, AJCC 7th Edition     Clinical: Stage IIIB (T4d, N1, cM0) - Signed by Lindsey Bedford, MD on 12/14/2013     Pathologic: Stage IIIB (T4d, N1a, cM0) - Signed by Lindsey Bedford, MD on 12/14/2013     History of left breast cancer (Resolved)  09/12/2002 Imaging   CT scan show no evidence of disease apart from the left axilla   09/2002 -  Neo-Adjuvant Chemotherapy   TAC X4 cycles    01/10/2003 Surgery   The patient had left lumpectomy and axillary lymph node dissection which show residual breast cancer and a sentinel lymph node was positive   2004 -  Adjuvant Chemotherapy   Cytoxan with 5-FU x4 cycles (methotrexate added at cycle 4).   2004 -  Radiation Therapy   Adjuvant left breast radiation   12/2002 Receptors her2   ER, PR and HER-2 were all negative.   Cancer of central portion of left breast (HCC)  04/13/2016 Mammogram   Scheduler coarse heterogeneous calcifications spanning an area of 3.7 cm in the lumpectomy site, indeterminate. Ultrasound of the left axilla was negative.   04/17/2016 Initial Diagnosis   Cancer of central portion of left breast (HCC)   04/17/2016 Initial Biopsy   Left breast core needle biopsy showed  invasive and in situ ductal carcinoma with calcification, grade 2.   04/17/2016 Receptors her2   ER 100% positive, PR 15% positive, strong staining, Ki-67 10%, HER-2 positive by IHC (3)   05/05/2016 Imaging   Bilateral breast MRI showed a 1.3 x 0.8 x 0.7 cm biopsy-proven ductal carcinoma in the region of the previous lumpectomy. Enlarged lobulated right inferior axillary lymph node with diffuse cortical thickening, biopsy is recommended.   05/15/2016 Pathology Results   right axilary node biopsy was negative for malignant cell    05/28/2016 Surgery   Simple left mastectomy   05/28/2016 Pathology Results   Left mastectomy showed invasive ductal carcinoma, grade 3, 3.6 cm, high grade DCIS, (+) LVI, surgical margins were negative. Tumor 3.6 cm, no lymph nodes identified.   06/25/2016 Imaging   Staging CT scan of the chest, abdomen and pelvis with contrast and a bone scan showed no evidence of metastasis. Postoperative fluid collection in the left breast, likely a seroma or hematoma. Right axillary adenopathy, which was biopsied previously.    07/07/2016 - 07/15/2017 Chemotherapy   Adjuvant chemotherapy with docetaxel , carboplatin , Herceptin  and pejeta every 3 weeks, for 6 cycles, followed by Herceptin  maintenance therapy for total of one year treatment  Ended [facility] 11/04/16     12/15/2016 -  Anti-estrogen oral therapy   Adjuvant letrozole  2.5 mg once daily   04/29/2017 Mammogram    Mammogram  04/29/2017 IMPRESSION: No mammographic evidence of malignancy. A result letter of this screening mammogram will be mailed directly to the patient.    08/26/2017 - 07/2018 Antibody Plan   She started Nerlynx  240mg  on 08/26/17, was held on 09/14/17 due to significant diarrhea and restarted when her diarrhea resolved on 09/21/17. Completed in 07/2018   09/22/2017 Procedure   Colonoscopy by Dr. Shila  IMPRESSION: - One 3 mm polyp in the transverse colon, removed with a cold biopsy forceps. Resected  and retrieved. - Non-bleeding internal hemorrhoids.   09/22/2017 Pathology Results   Diagnosis, colonoscopy  Surgical [P], transverse, polyp - TUBULAR ADENOMA(S). - HIGH GRADE DYSPLASIA IS NOT IDENTIFIED.      CURRENT THERAPY: Letrozole  2.5 mg daily, starting 12/15/16  INTERVAL HISTORY Lindsey Marquez returns for follow up as scheduled.   ROS   Past Medical History:  Diagnosis Date   Blood transfusion without reported diagnosis    Breast cancer (HCC) 06/2001   Invasive ductal carcinoma Left breast. 2002, 2017   Diabetes mellitus without complication (HCC)    Difficult intravenous access    Family history of malignant neoplasm of ovary    Family history of pancreatic cancer    Hypertension      Past Surgical History:  Procedure Laterality Date   BREAST SURGERY Left 2003   COLONOSCOPY     FOOT SURGERY  2000   INSERTION OF MESH N/A 02/17/2018   Procedure: INSERTION OF MESH;  Surgeon: Belinda Cough, MD;  Location: [facility];  Service: General;  Laterality: N/A;   MASTECTOMY Left 05/28/2016   port a cath insertion  05/28/2016   PORT-A-CATH REMOVAL Right 08/19/2017   Procedure: REMOVAL PORT-A-CATH;  Surgeon: Belinda Cough, MD;  Location: [facility];  Service: General;  Laterality: Right;   PORTACATH PLACEMENT Right 05/28/2016   Procedure: INSERTION PORT-A-CATH;  Surgeon: Cough Belinda, MD;  Location: [facility] OR;  Service: General;  Laterality: Right;   TOTAL MASTECTOMY Left 05/28/2016   Procedure: LEFT  MASTECTOMY;  Surgeon: Cough Belinda, MD;  Location: [facility] OR;  Service: General;  Laterality: Left;   TUBAL LIGATION  22 yrs since  2004.   Bilateral.   VENTRAL HERNIA REPAIR  02/17/2018   open   VENTRAL HERNIA REPAIR N/A 02/17/2018   Procedure: OPEN VENTRAL HERNIA REPAIR WITH MESH;  Surgeon: Belinda Cough, MD;  Location: [facility] OR;  Service: General;  Laterality: N/A;     Outpatient Encounter Medications as of 08/01/2024  Medication Sig   aspirin 81 MG tablet Take 81 mg by mouth  daily as needed (leg pain).   atorvastatin  (LIPITOR) 20 MG tablet Take 1 tablet by mouth once daily   Blood Glucose Monitoring Suppl (ONETOUCH VERIO) w/Device KIT Use to check blood sugar 3 times a day. E11.9   dapagliflozin  propanediol (FARXIGA ) 10 MG TABS tablet Take 1 tablet (10 mg total) by mouth daily.   empagliflozin  (JARDIANCE ) 25 MG TABS tablet Take 1 tablet (25 mg total) by mouth daily.   gabapentin  (NEURONTIN ) 300 MG capsule TAKE 2 CAPSULES BY MOUTH AT BEDTIME   glucose blood (ONETOUCH VERIO) test strip USE 3x a day as recommended for diabetes type 2, insulin  dependent   ibuprofen  (ADVIL ) 800 MG tablet Take 800 mg by mouth every 6 (six) hours as needed.   Insulin  Pen Needle (BD PEN NEEDLE NANO 2ND GEN) 32G X 4 MM MISC USE 1 PEN NEEDLE AS DIRECTED TWICE DAILY   letrozole  (FEMARA ) 2.5 MG tablet Take 1 tablet by mouth  once daily   lisinopril -hydrochlorothiazide  (ZESTORETIC ) 20-25 MG tablet Take 1 tablet by mouth once daily   metFORMIN  (GLUCOPHAGE ) 1000 MG tablet Take 1 tablet (1,000 mg total) by mouth 2 (two) times daily with a meal.   NON FORMULARY Peripheral Neuropathy cream from [facility]   Omega-3 Fatty Acids (FISH OIL) 1200 MG CAPS Take 1,200 mg by mouth daily.    TRESIBA  FLEXTOUCH 100 UNIT/ML FlexTouch Pen Inject 30 Units into the skin daily.   TRUEPLUS LANCETS 33G MISC 1 each by Does not apply route 3 (three) times daily.   No facility-administered encounter medications on Marquez as of 08/01/2024.     There were no vitals filed for this visit. There is no height or weight on Marquez to calculate BMI.   ECOG PERFORMANCE STATUS: {CHL ONC ECOG PS:[phone]}  PHYSICAL EXAM GENERAL:alert, no distress and comfortable SKIN: no rash  EYES: sclera clear NECK: without mass LYMPH:  no palpable cervical or supraclavicular lymphadenopathy  LUNGS: clear with normal breathing effort HEART: regular rate & rhythm, no lower extremity edema ABDOMEN: abdomen soft, non-tender and normal  bowel sounds NEURO: alert & oriented x 3 with fluent speech, no focal motor/sensory deficits Breast exam:  PAC without erythema    CBC    Latest Ref Rng & Units 02/17/2024   12:24 PM 08/04/2023   11:22 AM 04/22/2023    2:20 PM  CBC  WBC 4.0 - 10.5 K/uL 8.8  7.8  9.7   Hemoglobin 12.0 - 15.0 g/dL 88.2  87.9  88.9   Hematocrit 36.0 - 46.0 % 35.7  35.0  32.9   Platelets 150.0 - 400.0 K/uL 401.0  336  345       CMP     Latest Ref Rng & Units 03/21/2024   10:33 AM 02/17/2024   12:24 PM 08/04/2023   11:22 AM  CMP  Glucose 70 - 99 mg/dL 99  896  858   BUN 6 - 23 mg/dL 23  23  28    Creatinine 0.40 - 1.20 mg/dL 9.01  8.97  8.77   Sodium 135 - 145 mEq/L 136  140  141   Potassium 3.5 - 5.1 mEq/L 4.6  4.7  4.5   Chloride 96 - 112 mEq/L 101  106  106   CO2 19 - 32 mEq/L 26  25  28    Calcium  8.4 - 10.5 mg/dL 9.6  9.7  9.4   Total Protein 6.0 - 8.3 g/dL  7.5  7.7   Total Bilirubin 0.2 - 1.2 mg/dL  0.4  0.4   Alkaline Phos 39 - 117 U/L  71  72   AST 0 - 37 U/L  17  17   ALT 0 - 35 U/L  15  16       ASSESSMENT & PLAN:Lindsey Marquez is a 73 y.o. female with    1. 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 She was treated with Neo-adjuvant chemo, left breast lumpectomy, adjuvant chemo and Radiation.  -She had another left breast cancer in 03/2016. She was treated with left mastectomy, adjuvant chemo TCHP and Nerlynx .  -She started letrozole  in 11/2016 for a goal of 10 years. Tolerating well with mild lower extremity joint pain and occasional hot flashes.     2. Anemia  -Her B12, iron, and reticulocyte count were normal in 2022, this is likely anemia of chronic disease -mild and stable, continue  monitoring   3. DM and HTN  -Follow-up with her primary care physician   4. 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  She wears compression socks.  -she started gabapentin  at night, takes 600 mg which is helpful -Will look into topical cream for neuropathy, on her med list from a previous prescription   5. Obesity  -Some weight gain recently she attributes to lack of exercise getting her kids ready for back to school -Encouraged her to continue healthy active lifestyle and age-appropriate health maintenance.    6. Bone Health -We previously discussed that Letrozole  can contribute to decreased bone density -DEXA is 04/29/2017 and 05/20/2019 were normal -Takes calcium     PLAN:  No orders of the defined types were placed in this encounter.     All questions were answered. The patient knows to call the clinic with any problems, questions or concerns. No barriers to learning were detected. I spent *** counseling the patient face to face. The total time spent in the appointment was *** and more than 50% was on counseling, review of test results, and coordination of care.   Tela Kotecki K Velisa Regnier, NP 07/31/2024 10:44 AM

## 2024-08-01 ENCOUNTER — Inpatient Hospital Stay: Payer: No Typology Code available for payment source | Attending: Nurse Practitioner

## 2024-08-01 ENCOUNTER — Encounter: Payer: Self-pay | Admitting: Nurse Practitioner

## 2024-08-01 ENCOUNTER — Inpatient Hospital Stay (HOSPITAL_BASED_OUTPATIENT_CLINIC_OR_DEPARTMENT_OTHER): Payer: No Typology Code available for payment source | Admitting: Nurse Practitioner

## 2024-08-01 VITALS — BP 153/68 | HR 88 | Temp 98.4°F | Resp 19 | Wt 215.4 lb

## 2024-08-01 DIAGNOSIS — D649 Anemia, unspecified: Secondary | ICD-10-CM | POA: Diagnosis not present

## 2024-08-01 DIAGNOSIS — E1142 Type 2 diabetes mellitus with diabetic polyneuropathy: Secondary | ICD-10-CM | POA: Insufficient documentation

## 2024-08-01 DIAGNOSIS — C50112 Malignant neoplasm of central portion of left female breast: Secondary | ICD-10-CM | POA: Insufficient documentation

## 2024-08-01 DIAGNOSIS — Z170 Estrogen receptor positive status [ER+]: Secondary | ICD-10-CM | POA: Insufficient documentation

## 2024-08-01 DIAGNOSIS — Z79811 Long term (current) use of aromatase inhibitors: Secondary | ICD-10-CM | POA: Diagnosis not present

## 2024-08-01 DIAGNOSIS — I10 Essential (primary) hypertension: Secondary | ICD-10-CM | POA: Insufficient documentation

## 2024-08-01 DIAGNOSIS — Z1721 Progesterone receptor positive status: Secondary | ICD-10-CM | POA: Diagnosis not present

## 2024-08-01 DIAGNOSIS — E669 Obesity, unspecified: Secondary | ICD-10-CM | POA: Insufficient documentation

## 2024-08-01 DIAGNOSIS — Z9012 Acquired absence of left breast and nipple: Secondary | ICD-10-CM | POA: Insufficient documentation

## 2024-08-01 DIAGNOSIS — Z923 Personal history of irradiation: Secondary | ICD-10-CM | POA: Diagnosis not present

## 2024-08-01 DIAGNOSIS — Z9221 Personal history of antineoplastic chemotherapy: Secondary | ICD-10-CM | POA: Insufficient documentation

## 2024-08-01 DIAGNOSIS — Z800 Family history of malignant neoplasm of digestive organs: Secondary | ICD-10-CM | POA: Insufficient documentation

## 2024-08-01 DIAGNOSIS — Z8041 Family history of malignant neoplasm of ovary: Secondary | ICD-10-CM | POA: Diagnosis not present

## 2024-08-01 LAB — CBC WITH DIFFERENTIAL (CANCER CENTER ONLY)
Abs Immature Granulocytes: 0.02 K/uL (ref 0.00–0.07)
Basophils Absolute: 0.1 K/uL (ref 0.0–0.1)
Basophils Relative: 1 %
Eosinophils Absolute: 0.2 K/uL (ref 0.0–0.5)
Eosinophils Relative: 3 %
HCT: 37.3 % (ref 36.0–46.0)
Hemoglobin: 12.7 g/dL (ref 12.0–15.0)
Immature Granulocytes: 0 %
Lymphocytes Relative: 28 %
Lymphs Abs: 2.3 K/uL (ref 0.7–4.0)
MCH: 33.5 pg (ref 26.0–34.0)
MCHC: 34 g/dL (ref 30.0–36.0)
MCV: 98.4 fL (ref 80.0–100.0)
Monocytes Absolute: 0.6 K/uL (ref 0.1–1.0)
Monocytes Relative: 7 %
Neutro Abs: 5.1 K/uL (ref 1.7–7.7)
Neutrophils Relative %: 61 %
Platelet Count: 352 K/uL (ref 150–400)
RBC: 3.79 MIL/uL — ABNORMAL LOW (ref 3.87–5.11)
RDW: 12.4 % (ref 11.5–15.5)
WBC Count: 8.3 K/uL (ref 4.0–10.5)
nRBC: 0 % (ref 0.0–0.2)

## 2024-08-01 LAB — CMP (CANCER CENTER ONLY)
ALT: 15 U/L (ref 0–44)
AST: 17 U/L (ref 15–41)
Albumin: 4.3 g/dL (ref 3.5–5.0)
Alkaline Phosphatase: 76 U/L (ref 38–126)
Anion gap: 6 (ref 5–15)
BUN: 20 mg/dL (ref 8–23)
CO2: 29 mmol/L (ref 22–32)
Calcium: 10.1 mg/dL (ref 8.9–10.3)
Chloride: 103 mmol/L (ref 98–111)
Creatinine: 0.98 mg/dL (ref 0.44–1.00)
GFR, Estimated: >60 mL/min (ref >60)
Glucose, Bld: 129 mg/dL — ABNORMAL HIGH (ref 70–99)
Potassium: 4.4 mmol/L (ref 3.5–5.1)
Sodium: 138 mmol/L (ref 135–145)
Total Bilirubin: 0.4 mg/dL (ref 0.0–1.2)
Total Protein: 8.2 g/dL — ABNORMAL HIGH (ref 6.5–8.1)

## 2024-08-01 MED ORDER — GABAPENTIN 300 MG PO CAPS: 900.0000 mg | ORAL_CAPSULE | Freq: Every day | ORAL | 2 refills | Status: AC

## 2024-08-01 MED ORDER — LETROZOLE 2.5 MG PO TABS: 2.5000 mg | ORAL_TABLET | Freq: Every day | ORAL | 12 refills | Status: AC

## 2024-08-03 ENCOUNTER — Encounter: Payer: Self-pay | Admitting: Internal Medicine

## 2024-08-03 ENCOUNTER — Ambulatory Visit: Admitting: Internal Medicine

## 2024-08-03 ENCOUNTER — Ambulatory Visit (INDEPENDENT_AMBULATORY_CARE_PROVIDER_SITE_OTHER): Admitting: Internal Medicine

## 2024-08-03 VITALS — BP 130/70 | HR 96 | Ht 67.75 in | Wt 212.4 lb

## 2024-08-03 DIAGNOSIS — E1165 Type 2 diabetes mellitus with hyperglycemia: Secondary | ICD-10-CM | POA: Diagnosis not present

## 2024-08-03 DIAGNOSIS — E1169 Type 2 diabetes mellitus with other specified complication: Secondary | ICD-10-CM

## 2024-08-03 DIAGNOSIS — Z794 Long term (current) use of insulin: Secondary | ICD-10-CM

## 2024-08-03 DIAGNOSIS — E785 Hyperlipidemia, unspecified: Secondary | ICD-10-CM | POA: Diagnosis not present

## 2024-08-03 DIAGNOSIS — E66812 Obesity, class 2: Secondary | ICD-10-CM | POA: Diagnosis not present

## 2024-08-03 LAB — POCT GLYCOSYLATED HEMOGLOBIN (HGB A1C): Hemoglobin A1C: 7.8 % — AB (ref 4.0–5.6)

## 2024-08-03 MED ORDER — DAPAGLIFLOZIN PROPANEDIOL 10 MG PO TABS: 10.0000 mg | ORAL_TABLET | Freq: Every day | ORAL | 11 refills | Status: AC

## 2024-08-03 MED ORDER — METFORMIN HCL 1000 MG PO TABS: 1000.0000 mg | ORAL_TABLET | Freq: Two times a day (BID) | ORAL | 3 refills | Status: AC

## 2024-08-03 MED ORDER — TRESIBA FLEXTOUCH 100 UNIT/ML ~~LOC~~ SOPN
30.0000 [IU] | PEN_INJECTOR | Freq: Every day | SUBCUTANEOUS | 3 refills | Status: DC
Start: 2024-08-03 — End: 2024-10-12

## 2024-08-03 NOTE — Patient Instructions (Addendum)
 Please continue: - Metformin  1000 mg 2x a day with meals - Farxiga  10 mg daily before b'fast - Tresiba  30 units at bedtime  Please stop at the lab.  Please come back for a follow-up appointment in 3 months.

## 2024-08-03 NOTE — Progress Notes (Deleted)
 Patient ID: Lindsey Marquez, female   DOB: [birth year], 73 y.o.   MRN: [id number]   HPI: Lindsey Marquez is a 73 y.o.-year-old female, returning for follow-up for DM2, dx in ~2001, insulin -dependent, uncontrolled, without long-term complications. Last visit 5 months ago.  Interim history: No increased urination, blurry vision, nausea, chest pain. 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  On letrozole .  Reviewed HbA1c levels: Lab Results  Component Value Date   HGBA1C 7.1 (A) 03/07/2024   HGBA1C 7.5 (A) 09/02/2023   HGBA1C 7.0 (A) 02/17/2023   HGBA1C 6.2 (A) 01/19/2022   HGBA1C 6.6 (A) 09/17/2021   HGBA1C 6.9 (A) 06/12/2021   HGBA1C 9.1 (A) 03/04/2021   HGBA1C 7.9 (A) 10/28/2020   HGBA1C 8.4 (H) 04/15/2020   HGBA1C 10.4 (A) 10/04/2019   HGBA1C 9.8 (H) 12/20/2018   HGBA1C 9.4 (A) 09/20/2018   HGBA1C 8.8 (H) 04/05/2018   HGBA1C 7.5 (H) 02/10/2018   HGBA1C 7.4 (H) 01/04/2018   HGBA1C 8.0 (H) 10/04/2017   HGBA1C 6.9 (H) 12/28/2016   HGBA1C 8.0 (H) 09/24/2016   HGBA1C 7.8 (H) 05/26/2016   HGBA1C 9.1 01/27/2016  09/02/2023: HbA1c calculated from fructosamine is 6.47%, lower than the directly measured HbA1c.  Pt is on: - Basaglar  20 >> 30 >> 34-36 >> 20 >> Tresiba  30 units at bedtime - Metformin  1000 mg 2x a day with meals - Farxiga  10 mg daily before b'fast  GLP1 R agonists were not affordable.  Pt checks her sugars 2-3 a day per review of her log: - am 86-122 >> 93-136, 149, 154 >> 113-124, 135, 148, 199 - 2h after b'fast: n/c - before lunch:  90s-101 >> n/c >> 93-114 >> 103-155, 188 - 2h after lunch: 84-143, 190 >> 85, 99-158 >> 172, 179 >> 200 - before dinner: 180-195 >> 200s >> 103-145, 174 >> n/c  - 2h after dinner: 114-166, 189, 199 >> 100-194 >> 104-160 - bedtime:  115-137, 182 >> 92-124, 146 >> n/c - nighttime: n/c Lowest sugar was 85 >> 93 >> 103; she has hypoglycemia awareness in the 70s. Highest sugar was 199 >> 194 >> 200.  Glucometer: True metrix  >> One Touch Verio  Pt's meals are: - Breakfast:coffee, mc muffin, egg - Lunch: [place] sandwich - Dinner: baked chicken, veggies,dessert - apples, - Snacks: yoghurt, pretzels, PB  - + Mild CKD, last BUN/creatinine:  Lab Results  Component Value Date   BUN 20 08/01/2024   BUN 23 03/21/2024   CREATININE 0.98 08/01/2024   CREATININE 0.98 03/21/2024   Lab Results  Component Value Date   MICRALBCREAT 35 (H) 03/09/2024  On lisinopril  10.  -+ HL; last set of lipids: Lab Results  Component Value Date   CHOL 125 02/17/2024   HDL 50.90 02/17/2024   LDLCALC 41 02/17/2024   LDLDIRECT 113.0 04/15/2020   TRIG 165.0 (H) 02/17/2024   CHOLHDL 2 02/17/2024  On Lipitor 10 >> 20 mg, fish oil.  - last eye exam was on 07/10/2024: No DR.  - no numbness and tingling in her feet.  Last foot exam was per Dr. Gaynel 05/01/2024.  She fractured the fifth L metatarsal in 08/2023. On ASA 81.  Pt has FH of DM in brother, sister, father.  + h/o BrCA in 2013, recurrence in 2018.  She is cancer free.    ROS: + See HPI  Current Outpatient Medications on File Prior to Visit  Medication Sig   aspirin 81 MG tablet Take 81  mg by mouth daily as needed (leg pain).   atorvastatin  (LIPITOR) 20 MG tablet Take 1 tablet by mouth once daily   Blood Glucose Monitoring Suppl (ONETOUCH VERIO) w/Device KIT Use to check blood sugar 3 times a day. E11.9   dapagliflozin  propanediol (FARXIGA ) 10 MG TABS tablet Take 1 tablet (10 mg total) by mouth daily.   empagliflozin  (JARDIANCE ) 25 MG TABS tablet Take 1 tablet (25 mg total) by mouth daily.   gabapentin  (NEURONTIN ) 300 MG capsule Take 3 capsules (900 mg total) by mouth at bedtime. May take 1 capsule in the morning if needed   glucose blood (ONETOUCH VERIO) test strip USE 3x a day as recommended for diabetes type 2, insulin  dependent   ibuprofen  (ADVIL ) 800 MG tablet Take 800 mg by mouth every 6 (six) hours as needed.   Insulin  Pen Needle (BD PEN NEEDLE NANO 2ND GEN)  32G X 4 MM MISC USE 1 PEN NEEDLE AS DIRECTED TWICE DAILY   letrozole  (FEMARA ) 2.5 MG tablet Take 1 tablet (2.5 mg total) by mouth daily.   lisinopril -hydrochlorothiazide  (ZESTORETIC ) 20-25 MG tablet Take 1 tablet by mouth once daily   metFORMIN  (GLUCOPHAGE ) 1000 MG tablet Take 1 tablet (1,000 mg total) by mouth 2 (two) times daily with a meal.   NON FORMULARY Peripheral Neuropathy cream from [facility]   Omega-3 Fatty Acids (FISH OIL) 1200 MG CAPS Take 1,200 mg by mouth daily.    TRESIBA  FLEXTOUCH 100 UNIT/ML FlexTouch Pen Inject 30 Units into the skin daily.   TRUEPLUS LANCETS 33G MISC 1 each by Does not apply route 3 (three) times daily.   No current facility-administered medications on file prior to visit.   Past Medical History:  Diagnosis Date   Blood transfusion without reported diagnosis    Breast cancer (HCC) 06/2001   Invasive ductal carcinoma Left breast. 2002, 2017   Diabetes mellitus without complication (HCC)    Difficult intravenous access    Family history of malignant neoplasm of ovary    Family history of pancreatic cancer    Hypertension    Past Surgical History:  Procedure Laterality Date   BREAST SURGERY Left 2003   COLONOSCOPY     FOOT SURGERY  2000   INSERTION OF MESH N/A 02/17/2018   Procedure: INSERTION OF MESH;  Surgeon: Belinda Cough, MD;  Location[facility] OR;  Service: General;  Laterality: N/A;   MASTECTOMY Left 05/28/2016   port a cath insertion  05/28/2016   PORT-A-CATH REMOVAL Right 08/19/2017   Procedure: REMOVAL PORT-A-CATH;  Surgeon: Belinda Cough, MD;  Location: [facility];  Service: General;  Laterality: Right;   PORTACATH PLACEMENT Right 05/28/2016   Procedure: INSERTION PORT-A-CATH;  Surgeon: Cough Belinda, MD;  [facility] OR;  Service: General;  Laterality: Right;   TOTAL MASTECTOMY Left 05/28/2016   Procedure: LEFT  MASTECTOMY;  Surgeon: Cough Belinda, MD;  Location: [facility] OR;  Service: General;  Laterality: Left;   TUBAL  LIGATION  22 yrs since  2004.   Bilateral.   VENTRAL HERNIA REPAIR  02/17/2018   open   VENTRAL HERNIA REPAIR N/A 02/17/2018   Procedure: OPEN VENTRAL HERNIA REPAIR WITH MESH;  Surgeon: Belinda Cough, MD;  Location: [facility] OR;  Service: General;  Laterality: N/A;   Social History   Socioeconomic History   Marital status: Divorced    Spouse name: Not on file   Number of children: Not on file   Years of education: Not on file   Highest education level:  Not on file  Occupational History   Not on file  Tobacco Use   Smoking status: Former    Current packs/day: 0.00    Average packs/day: 0.1 packs/day for 7.0 years (0.7 ttl pk-yrs)    Types: Cigarettes    Start date: 11/30/1968    Quit date: 12/01/1975    Years since quitting: 48.7   Smokeless tobacco: Never  Vaping Use   Vaping status: Never Used  Substance and Sexual Activity   Alcohol use: No    Alcohol/week: 0.0 standard drinks of alcohol   Drug use: No   Sexual activity: Yes  Other Topics Concern   Not on file  Social History Narrative   Not on file   Social Drivers of Health   Financial Resource Strain: Low Risk  (07/14/2023)   Overall Financial Resource Strain (CARDIA)    Difficulty of Paying Living Expenses: Not hard at all  Food Insecurity: No Food Insecurity (07/14/2023)   Hunger Vital Sign    Worried About Running Out of Food in the Last Year: Never true    Ran Out of Food in the Last Year: Never true  Transportation Needs: No Transportation Needs (07/14/2023)   PRAPARE - Administrator, Civil Service (Medical): No    Lack of Transportation (Non-Medical): No  Physical Activity: Insufficiently Active (07/14/2023)   Exercise Vital Sign    Days of Exercise per Week: 3 days    Minutes of Exercise per Session: 20 min  Stress: No Stress Concern Present (07/14/2023)   [employer] of Occupational Health - Occupational Stress Questionnaire    Feeling of Stress : Not at all  Social Connections: Moderately  Integrated (07/14/2023)   Social Connection and Isolation Panel    Frequency of Communication with Friends and Family: More than three times a week    Frequency of Social Gatherings with Friends and Family: More than three times a week    Attends Religious Services: More than 4 times per year    Active Member of [employer] or Organizations: Yes    Attends Engineer, structural: More than 4 times per year    Marital Status: Divorced  Intimate Partner Violence: Not At Risk (07/14/2023)   Humiliation, Afraid, Rape, and Kick questionnaire    Fear of Current or Ex-Partner: No    Emotionally Abused: No    Physically Abused: No    Sexually Abused: No    Allergies  Allergen Reactions   Codeine Other (See Comments)    get crazy, see things that aren't there   Family History  Problem Relation Age of Onset   Pancreatic cancer Mother 75       deceased   Prostate cancer Father    Colon polyps Sister    Ovarian cancer Maternal Aunt 72       deceased   Cancer Maternal Aunt        2 other mat aunts; unk. primary in 37s; deceased   Cancer Maternal Uncle        bone ca; unk. primary; deceased 42s   Prostate cancer Maternal Uncle        deceased 73   Colon cancer Neg Hx    Esophageal cancer Neg Hx    Rectal cancer Neg Hx    Stomach cancer Neg Hx    PE:  There were no vitals taken for this visit. Wt Readings from Last 10 Encounters:  08/01/24 215 lb 7 oz (97.7 kg)  05/01/24 220 lb  6.4 oz (100 kg)  03/07/24 220 lb 6.4 oz (100 kg)  02/17/24 223 lb 2 oz (101.2 kg)  01/24/24 222 lb 2.1 oz (100.8 kg)  10/14/23 222 lb 2.1 oz (100.8 kg)  09/07/23 222 lb 2 oz (100.8 kg)  09/02/23 224 lb 3.2 oz (101.7 kg)  08/24/23 223 lb 6 oz (101.3 kg)  08/04/23 222 lb 1.6 oz (100.7 kg)   Constitutional: overweight, in NAD Eyes:  EOMI, no exophthalmos ENT: no neck masses, no cervical lymphadenopathy Cardiovascular: RRR, No MRG Respiratory: CTA B Musculoskeletal: no deformities Skin:no  rashes Neurological: no tremor with outstretched hands  ASSESSMENT: 1. DM2, insulin -dependent, uncontrolled, without long-term complications, but with hyperglycemia  2. HL  3.  Obesity class II  PLAN:  1. Patient with longstanding, uncontrolled, type 2 diabetes, on oral antidiabetic regimen with metformin , SGLT2 inhibitor and long-acting insulin , with improved HbA1c at last visit, to 7.1%.  Her HbA1c level calculated from fructosamine (which was correlating better with her blood sugars at home) was previously 6.5%, while a directly-measured HbA1c was 7.5%. - At last visit, sugars were slightly better, but she still had blood sugars above target throughout the day.  Upon questioning, she was sipping on protein shakes throughout the day.  I recommended to stop.  Otherwise, we continued the same regimen.  She mentioned her medicines were more expensive likely due to her insurance deductible.  I advised her to check if generic Farxiga  or Jardiance  are cheaper.  Given written prescription for both.  - I suggested to: Patient Instructions  Please continue: - Metformin  1000 mg 2x a day with meals - Farxiga  10 mg daily before b'fast/Jardiance  25 mg before b'fast - Tresiba  30 units at bedtime  Please stop at the lab.  Please come back for a follow-up appointment in 4 months.  - we checked her HbA1c: 7%  - advised to check sugars at different times of the day - 1x a day, rotating check times - advised for yearly eye exams >> she is UTD - ACR was slightly elevated at last check.  She continues on lisinopril  20 mg daily and Farxiga /Jardiance  - return to clinic in 4 months.  2. HL - Latest lipid panel showed fractions at goal: Lab Results  Component Value Date   CHOL 125 02/17/2024   HDL 50.90 02/17/2024   LDLCALC 41 02/17/2024   LDLDIRECT 113.0 04/15/2020   TRIG 165.0 (H) 02/17/2024   CHOLHDL 2 02/17/2024  - She continues on Lipitor 20 mg daily without side effects.  She also takes fish  oil.  3.  Obesity class II - Will continue the SGLT2 inhibitor which should also help with weight loss - Weight was stable at last visit, previously gained 10 pounds  Lela Fendt, MD PhD [facility]

## 2024-08-03 NOTE — Progress Notes (Signed)
 Patient ID: Lindsey Marquez, female   DOB: Aug 29, 1951, 73 y.o.   MRN: [id number]  This dose was precharted in AM 08/03/2024.  HPI: Lindsey Marquez is a 74 y.o.-year-old female, returning for follow-up for DM2, dx in ~2001, insulin -dependent, uncontrolled, without long-term complications. Last visit 5 months ago.  Interim history: No increased urination, blurry vision, nausea, chest pain. 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  On letrozole .  Reviewed HbA1c levels: Lab Results  Component Value Date   HGBA1C 7.1 (A) 03/07/2024   HGBA1C 7.5 (A) 09/02/2023   HGBA1C 7.0 (A) 02/17/2023   HGBA1C 6.2 (A) 01/19/2022   HGBA1C 6.6 (A) 09/17/2021   HGBA1C 6.9 (A) 06/12/2021   HGBA1C 9.1 (A) 03/04/2021   HGBA1C 7.9 (A) 10/28/2020   HGBA1C 8.4 (H) 04/15/2020   HGBA1C 10.4 (A) 10/04/2019   HGBA1C 9.8 (H) 12/20/2018   HGBA1C 9.4 (A) 09/20/2018   HGBA1C 8.8 (H) 04/05/2018   HGBA1C 7.5 (H) 02/10/2018   HGBA1C 7.4 (H) 01/04/2018   HGBA1C 8.0 (H) 10/04/2017   HGBA1C 6.9 (H) 12/28/2016   HGBA1C 8.0 (H) 09/24/2016   HGBA1C 7.8 (H) 05/26/2016   HGBA1C 9.1 01/27/2016  09/02/2023: HbA1c calculated from fructosamine is 6.47%, lower than the directly measured HbA1c.  Pt is on: - Basaglar  20 >> 30 >> 34-36 >> 20 >> Tresiba  30 units at bedtime - Metformin  1000 mg 2x a day with meals - Farxiga  10 mg daily before b'fast  GLP1 R agonists were not affordable.  Pt checks her sugars 2-3 a day per review of her log: - am  93-136, 149, 154 >> 113-124, 135, 148, 199 >> n/c - 2h after b'fast: n/c >> 128-141 - before lunch:  90s-101 >> n/c >> 93-114 >> 103-155, 188 >> n/c - 2h after lunch: 84-143, 190 >> 85, 99-158 >> 172, 179 >> 200 >> n/c - before dinner: 180-195 >> 200s >> 103-145, 174 >> n/c  - 2h after dinner: 114-166, 189, 199 >> 100-194 >> 104-160 >> 111, 131 - bedtime:  115-137, 182 >> 92-124, 146 >> n/c - nighttime: n/c Lowest sugar was 85 >> 93 >> 103 >> 111; she has hypoglycemia  awareness in the 70s. Highest sugar was 199 >> 194 >> 200 >> 141.  Glucometer: True metrix >> One Touch Verio  Pt's meals are: - Breakfast:coffee, mc muffin, egg - Lunch: [place] sandwich - Dinner: baked chicken, veggies,dessert - apples, - Snacks: yoghurt, pretzels, PB  - + Mild CKD, last BUN/creatinine:  Lab Results  Component Value Date   BUN 20 08/01/2024   BUN 23 03/21/2024   CREATININE 0.98 08/01/2024   CREATININE 0.98 03/21/2024   Lab Results  Component Value Date   MICRALBCREAT 35 (H) 03/09/2024  On lisinopril  10.  -+ HL; last set of lipids: Lab Results  Component Value Date   CHOL 125 02/17/2024   HDL 50.90 02/17/2024   LDLCALC 41 02/17/2024   LDLDIRECT 113.0 04/15/2020   TRIG 165.0 (H) 02/17/2024   CHOLHDL 2 02/17/2024  On Lipitor 10 >> 20 mg, fish oil.  - last eye exam was on 07/10/2024: No DR.  - no numbness and tingling in her feet.  Last foot exam was per Dr. Gaynel 05/01/2024.  She fractured the fifth L metatarsal in 08/2023. On ASA 81.  Pt has FH of DM in brother, sister, father.  + h/o BrCA in 2013, recurrence in 2018.  She is cancer free.    ROS: + See  HPI  Current Outpatient Medications on File Prior to Visit  Medication Sig   aspirin 81 MG tablet Take 81 mg by mouth daily as needed (leg pain).   atorvastatin  (LIPITOR) 20 MG tablet Take 1 tablet by mouth once daily   Blood Glucose Monitoring Suppl (ONETOUCH VERIO) w/Device KIT Use to check blood sugar 3 times a day. E11.9   dapagliflozin  propanediol (FARXIGA ) 10 MG TABS tablet Take 1 tablet (10 mg total) by mouth daily.   empagliflozin  (JARDIANCE ) 25 MG TABS tablet Take 1 tablet (25 mg total) by mouth daily.   gabapentin  (NEURONTIN ) 300 MG capsule Take 3 capsules (900 mg total) by mouth at bedtime. May take 1 capsule in the morning if needed   glucose blood (ONETOUCH VERIO) test strip USE 3x a day as recommended for diabetes type 2, insulin  dependent   ibuprofen  (ADVIL ) 800 MG tablet Take 800  mg by mouth every 6 (six) hours as needed.   Insulin  Pen Needle (BD PEN NEEDLE NANO 2ND GEN) 32G X 4 MM MISC USE 1 PEN NEEDLE AS DIRECTED TWICE DAILY   letrozole  (FEMARA ) 2.5 MG tablet Take 1 tablet (2.5 mg total) by mouth daily.   lisinopril -hydrochlorothiazide  (ZESTORETIC ) 20-25 MG tablet Take 1 tablet by mouth once daily   metFORMIN  (GLUCOPHAGE ) 1000 MG tablet Take 1 tablet (1,000 mg total) by mouth 2 (two) times daily with a meal.   NON FORMULARY Peripheral Neuropathy cream from [facility]   Omega-3 Fatty Acids (FISH OIL) 1200 MG CAPS Take 1,200 mg by mouth daily.    TRESIBA  FLEXTOUCH 100 UNIT/ML FlexTouch Pen Inject 30 Units into the skin daily.   TRUEPLUS LANCETS 33G MISC 1 each by Does not apply route 3 (three) times daily.   No current facility-administered medications on file prior to visit.   Past Medical History:  Diagnosis Date   Blood transfusion without reported diagnosis    Breast cancer (HCC) 06/2001   Invasive ductal carcinoma Left breast. 2002, 2017   Diabetes mellitus without complication (HCC)    Difficult intravenous access    Family history of malignant neoplasm of ovary    Family history of pancreatic cancer    Hypertension    Past Surgical History:  Procedure Laterality Date   BREAST SURGERY Left 2003   COLONOSCOPY     FOOT SURGERY  2000   INSERTION OF MESH N/A 02/17/2018   Procedure: INSERTION OF MESH;  Surgeon: Belinda Cough, MD;  [facility];  Service: General;  Laterality: N/A;   MASTECTOMY Left 05/28/2016   port a cath insertion  05/28/2016   PORT-A-CATH REMOVAL Right 08/19/2017   Procedure: REMOVAL PORT-A-CATH;  Surgeon: Belinda Cough, MD;  Location: [facility];  Service: General;  Laterality: Right;   PORTACATH PLACEMENT Right 05/28/2016   Procedure: INSERTION PORT-A-CATH;  Surgeon: Cough Belinda, MD;  Location: [facility] OR;  Service: General;  Laterality: Right;   TOTAL MASTECTOMY Left 05/28/2016   Procedure: LEFT  MASTECTOMY;   Surgeon: Cough Belinda, MD;  Location: [facility] OR;  Service: General;  Laterality: Left;   TUBAL LIGATION  22 yrs since  2004.   Bilateral.   VENTRAL HERNIA REPAIR  02/17/2018   open   VENTRAL HERNIA REPAIR N/A 02/17/2018   Procedure: OPEN VENTRAL HERNIA REPAIR WITH MESH;  Surgeon: Belinda Cough, MD;  Location: [facility] OR;  Service: General;  Laterality: N/A;   Social History   Socioeconomic History   Marital status: Divorced    Spouse name: Not on file  Number of children: Not on file   Years of education: Not on file   Highest education level: Not on file  Occupational History   Not on file  Tobacco Use   Smoking status: Former    Current packs/day: 0.00    Average packs/day: 0.1 packs/day for 7.0 years (0.7 ttl pk-yrs)    Types: Cigarettes    Start date: 11/30/1968    Quit date: 12/01/1975    Years since quitting: 48.7   Smokeless tobacco: Never  Vaping Use   Vaping status: Never Used  Substance and Sexual Activity   Alcohol use: No    Alcohol/week: 0.0 standard drinks of alcohol   Drug use: No   Sexual activity: Yes  Other Topics Concern   Not on file  Social History Narrative   Not on file   Social Drivers of Health   Financial Resource Strain: Low Risk  (07/14/2023)   Overall Financial Resource Strain (CARDIA)    Difficulty of Paying Living Expenses: Not hard at all  Food Insecurity: No Food Insecurity (07/14/2023)   Hunger Vital Sign    Worried About Running Out of Food in the Last Year: Never true    Ran Out of Food in the Last Year: Never true  Transportation Needs: No Transportation Needs (07/14/2023)   PRAPARE - Administrator, Civil Service (Medical): No    Lack of Transportation (Non-Medical): No  Physical Activity: Insufficiently Active (07/14/2023)   Exercise Vital Sign    Days of Exercise per Week: 3 days    Minutes of Exercise per Session: 20 min  Stress: No Stress Concern Present (07/14/2023)   [employer] of Occupational Health - Occupational  Stress Questionnaire    Feeling of Stress : Not at all  Social Connections: Moderately Integrated (07/14/2023)   Social Connection and Isolation Panel    Frequency of Communication with Friends and Family: More than three times a week    Frequency of Social Gatherings with Friends and Family: More than three times a week    Attends Religious Services: More than 4 times per year    Active Member of [employer] or Organizations: Yes    Attends Engineer, structural: More than 4 times per year    Marital Status: Divorced  Intimate Partner Violence: Not At Risk (07/14/2023)   Humiliation, Afraid, Rape, and Kick questionnaire    Fear of Current or Ex-Partner: No    Emotionally Abused: No    Physically Abused: No    Sexually Abused: No    Allergies  Allergen Reactions   Codeine Other (See Comments)    get crazy, see things that aren't there   Family History  Problem Relation Age of Onset   Pancreatic cancer Mother 63       deceased   Prostate cancer Father    Colon polyps Sister    Ovarian cancer Maternal Aunt 13       deceased   Cancer Maternal Aunt        2 other mat aunts; unk. primary in 41s; deceased   Cancer Maternal Uncle        bone ca; unk. primary; deceased 12s   Prostate cancer Maternal Uncle        deceased 58   Colon cancer Neg Hx    Esophageal cancer Neg Hx    Rectal cancer Neg Hx    Stomach cancer Neg Hx    PE:  BP 130/70   Pulse 96  Ht 5' 7.75 (1.721 m)   Wt 212 lb 6.4 oz (96.3 kg)   SpO2 97%   BMI 32.53 kg/m  Wt Readings from Last 10 Encounters:  08/03/24 212 lb 6.4 oz (96.3 kg)  08/01/24 215 lb 7 oz (97.7 kg)  05/01/24 220 lb 6.4 oz (100 kg)  03/07/24 220 lb 6.4 oz (100 kg)  02/17/24 223 lb 2 oz (101.2 kg)  01/24/24 222 lb 2.1 oz (100.8 kg)  10/14/23 222 lb 2.1 oz (100.8 kg)  09/07/23 222 lb 2 oz (100.8 kg)  09/02/23 224 lb 3.2 oz (101.7 kg)  08/24/23 223 lb 6 oz (101.3 kg)   Constitutional: overweight, in NAD Eyes:  EOMI, no  exophthalmos ENT: no neck masses, no cervical lymphadenopathy Cardiovascular: Tachycardia, RR, No MRG Respiratory: CTA B Musculoskeletal: no deformities Skin:no rashes Neurological: no tremor with outstretched hands  ASSESSMENT: 1. DM2, insulin -dependent, uncontrolled, without long-term complications, but with hyperglycemia  2. HL  3.  Obesity class II  PLAN:  1. Patient with longstanding, uncontrolled, type 2 diabetes, on oral antidiabetic regimen with metformin , SGLT2 inhibitor and long-acting insulin , with improved HbA1c at last visit, to 7.1%.  Her HbA1c level calculated from fructosamine (which was correlating better with her blood sugars at home) was previously 6.5%, while a directly-measured HbA1c was 7.5%. - At last visit, sugars were slightly better, but she still had blood sugars above target throughout the day.  Upon questioning, she was sipping on protein shakes throughout the day.  I recommended to stop.  Otherwise, we continued the same regimen.  She mentioned her medicines were more expensive likely due to her insurance deductible.  I advised her to check if generic Farxiga  or Jardiance  are cheaper.  Given written prescription for both. -At today's visit, she tells me she was able to start Farxiga .  Sugars appear to be excellent, but she is only checking occasionally after meals.  We discussed about rotating check times.  Would not change the regimen for now. - I suggested to: Patient Instructions  Please continue: - Metformin  1000 mg 2x a day with meals - Farxiga  10 mg daily before b'fast - Tresiba  30 units at bedtime  Please stop at the lab.  Please come back for a follow-up appointment in 4 months.  - we checked her HbA1c: 7.8% (higher than expected from the log) - advised to check sugars at different times of the day - 1x a day, rotating check times - advised for yearly eye exams >> she is UTD - ACR was slightly elevated at last check.  She continues on lisinopril   10 mg daily and Farxiga .  Will repeat this today. - return to clinic in 3-4 months.  2. HL - His lipid fractions were at goal: Lab Results  Component Value Date   CHOL 125 02/17/2024   HDL 50.90 02/17/2024   LDLCALC 41 02/17/2024   LDLDIRECT 113.0 04/15/2020   TRIG 165.0 (H) 02/17/2024   CHOLHDL 2 02/17/2024  - He continues Lipitor 20 mg daily without side effects.  She also takes fish oil.  3.  Obesity class II - Will continue the SGLT2 inhibitor which should also help with weight loss - Weight was stable at last visit, previously gained 10 pounds. - She lost 8 pounds since last visit  Orders Placed This Encounter  Procedures   Microalbumin / creatinine urine ratio   POCT glycosylated hemoglobin (Hb A1C)   Lela Fendt, MD PhD [facility] Endocrinology

## 2024-08-04 ENCOUNTER — Ambulatory Visit: Payer: Self-pay | Admitting: Internal Medicine

## 2024-08-04 LAB — MICROALBUMIN / CREATININE URINE RATIO
Creatinine, Urine: 66 mg/dL (ref 20–275)
Microalb Creat Ratio: 45 mg/g{creat} — ABNORMAL HIGH (ref <30)
Microalb, Ur: 3 mg/dL

## 2024-08-07 ENCOUNTER — Ambulatory Visit: Admitting: Internal Medicine

## 2024-09-04 ENCOUNTER — Ambulatory Visit
Admission: RE | Admit: 2024-09-04 | Discharge: 2024-09-04 | Disposition: A | Discharge status: Home / Self Care | Source: Ambulatory Visit | Attending: Family Medicine | Admitting: Family Medicine

## 2024-09-04 DIAGNOSIS — Z1231 Encounter for screening mammogram for malignant neoplasm of breast: Secondary | ICD-10-CM | POA: Diagnosis not present

## 2024-09-06 ENCOUNTER — Ambulatory Visit: Payer: Self-pay | Admitting: Family Medicine

## 2024-09-07 ENCOUNTER — Ambulatory Visit: Admitting: Podiatry

## 2024-09-07 DIAGNOSIS — G620 Drug-induced polyneuropathy: Secondary | ICD-10-CM

## 2024-09-07 DIAGNOSIS — E1165 Type 2 diabetes mellitus with hyperglycemia: Secondary | ICD-10-CM

## 2024-09-07 DIAGNOSIS — B351 Tinea unguium: Secondary | ICD-10-CM | POA: Diagnosis not present

## 2024-09-07 DIAGNOSIS — M79675 Pain in left toe(s): Secondary | ICD-10-CM | POA: Diagnosis not present

## 2024-09-07 DIAGNOSIS — Z794 Long term (current) use of insulin: Secondary | ICD-10-CM

## 2024-09-07 DIAGNOSIS — M79674 Pain in right toe(s): Secondary | ICD-10-CM

## 2024-09-07 DIAGNOSIS — T451X5A Adverse effect of antineoplastic and immunosuppressive drugs, initial encounter: Secondary | ICD-10-CM

## 2024-09-08 DIAGNOSIS — E669 Obesity, unspecified: Secondary | ICD-10-CM | POA: Diagnosis not present

## 2024-09-08 DIAGNOSIS — E1169 Type 2 diabetes mellitus with other specified complication: Secondary | ICD-10-CM | POA: Diagnosis not present

## 2024-09-08 DIAGNOSIS — Z6832 Body mass index (BMI) 32.0-32.9, adult: Secondary | ICD-10-CM | POA: Diagnosis not present

## 2024-09-08 DIAGNOSIS — Z794 Long term (current) use of insulin: Secondary | ICD-10-CM | POA: Diagnosis not present

## 2024-09-08 DIAGNOSIS — Z008 Encounter for other general examination: Secondary | ICD-10-CM | POA: Diagnosis not present

## 2024-09-08 DIAGNOSIS — E7849 Other hyperlipidemia: Secondary | ICD-10-CM | POA: Diagnosis not present

## 2024-09-14 ENCOUNTER — Encounter: Payer: Self-pay | Admitting: Podiatry

## 2024-09-14 NOTE — Progress Notes (Signed)
  Subjective:  Patient ID: Lindsey Marquez, female    DOB: 1951-04-08,  MRN: [id number]  Lindsey Marquez presents to clinic today for at risk foot care with h/o neuropathy secondary to chemotherapy and painful mycotic toenails of both feet that are difficult to trim. Pain interferes with daily activities and wearing enclosed shoe gear comfortably.  Chief Complaint  Patient presents with   Toe Pain    Diabetic foot pain. A1c 7.8. PCP is Dr. Randeen. Last visit was in March 2025   New problem(s): None.   PCP is Tower, Laine LABOR, MD.  Allergies  Allergen Reactions   Codeine Other (See Comments)    get crazy, see things that aren't there    Review of Systems: Negative except as noted in the HPI.  Objective: No changes noted in today's physical examination. There were no vitals filed for this visit. Lindsey Marquez is a pleasant 73 y.o. female in NAD. AAO x 3.  Vascular Examination: Capillary refill time immediate b/l. Palpable pedal pulses. Pedal hair present b/l. Pedal edema absent. No pain with calf compression b/l. Skin temperature gradient WNL b/l. No cyanosis or clubbing b/l. No ischemia or gangrene noted b/l.   Neurological Examination: Sensation grossly intact b/l with 10 gram monofilament. Vibratory sensation intact b/l. Pt has subjective symptoms of neuropathy.  Dermatological Examination: Pedal skin with normal turgor, texture and tone b/l.  No open wounds. No interdigital macerations.   Toenails 1-5 b/l thick, discolored, elongated with subungual debris and pain on dorsal palpation.   No corns, calluses, nor porokeratotic lesions.  Musculoskeletal Examination: Muscle strength 5/5 to all lower extremity muscle groups bilaterally. HAV with bunion deformity noted b/l LE. Hammertoe(s) 1-5 b/l.Lindsey Marquez No pain, crepitus or joint limitation noted with ROM b/l LE.  Patient ambulates independently without assistive aids.  Radiographs: None  Last A1c:      Latest Ref Rng & Units  08/03/2024    2:24 PM 03/07/2024   11:11 AM  Hemoglobin A1C  Hemoglobin-A1c 4.0 - 5.6 % 7.8  7.1    Assessment/Plan: 1. Pain due to onychomycosis of toenails of both feet   2. Chemotherapy-induced neuropathy   3. Type 2 diabetes mellitus with hyperglycemia, with long-term current use of insulin  [facility])   Consent given for treatment. Patient examined. All patient's and/or POA's questions/concerns addressed on today's visit. Mycotic toenails 1-5 debrided in length and girth without incident. Continue foot and shoe inspections daily. Monitor blood glucose per PCP/Endocrinologist's recommendations.Continue soft, supportive shoe gear daily. Report any pedal injuries to medical professional. Call office if there are any quesitons/concerns. -Patient/POA to call should there be question/concern in the interim.   Return in about 3 months (around 12/08/2024).  Lindsey Marquez, DPM      [place] LOCATION: [address] [place] [address]                   Office [phone]   [facility] LOCATION: [address] [place], [place] [address] Office [phone]

## 2024-09-19 NOTE — Telephone Encounter (Signed)
 Copied from CRM [id number]. Topic: Clinical - Request for Lab/Test Order >> Sep 18, 2024  4:27 PM Antwanette L wrote: Reason for CRM: Patient is requesting to schedule a TB test.The patient can be contacted at [phone].

## 2024-09-21 ENCOUNTER — Telehealth: Payer: Self-pay | Admitting: *Deleted

## 2024-09-21 NOTE — Telephone Encounter (Signed)
 Tower, Laine LABOR, MD to [employer]  What does pt need TB test for ?  Skin or lab?  Thanks

## 2024-09-21 NOTE — Telephone Encounter (Signed)
 Reason for CRM: Patient is requesting to schedule a TB test.The patient can be contacted at [phone].

## 2024-09-21 NOTE — Telephone Encounter (Signed)
 Is there a preference of ppd vs blood TB gold test? Let me know and I will order

## 2024-09-21 NOTE — Telephone Encounter (Signed)
 Pt needs TB skin test, no order needed, CMA or nurse will place order at appt   FYI to PCP

## 2024-09-21 NOTE — Telephone Encounter (Signed)
 Thanks

## 2024-09-21 NOTE — Telephone Encounter (Signed)
 Pt needs a Tb skin test for work. Nurse visit scheduled for Tuesday 09/26/24  FYI to PCP

## 2024-09-25 ENCOUNTER — Ambulatory Visit

## 2024-09-26 ENCOUNTER — Other Ambulatory Visit

## 2024-09-26 ENCOUNTER — Telehealth: Payer: Self-pay | Admitting: Family Medicine

## 2024-09-26 ENCOUNTER — Ambulatory Visit (INDEPENDENT_AMBULATORY_CARE_PROVIDER_SITE_OTHER)

## 2024-09-26 ENCOUNTER — Ambulatory Visit

## 2024-09-26 VITALS — Ht 67.75 in | Wt 212.0 lb

## 2024-09-26 DIAGNOSIS — Z111 Encounter for screening for respiratory tuberculosis: Secondary | ICD-10-CM | POA: Insufficient documentation

## 2024-09-26 DIAGNOSIS — Z0000 Encounter for general adult medical examination without abnormal findings: Secondary | ICD-10-CM

## 2024-09-26 NOTE — Progress Notes (Signed)
 Subjective:   Lindsey Marquez is a 73 y.o. who presents for a Medicare Wellness preventive visit.  As a reminder, Annual Wellness Visits don't include a physical exam, and some assessments may be limited, especially if this visit is performed virtually. We may recommend an in-person follow-up visit with your provider if needed.  Visit Complete: Virtual I connected with  Lindsey Marquez on 09/26/24 by a audio enabled telemedicine application and verified that I am speaking with the correct person using two identifiers.  Patient Location: Home  Provider Location: Office/Clinic  I discussed the limitations of evaluation and management by telemedicine. The patient expressed understanding and agreed to proceed.  Vital Signs: Because this visit was a virtual/telehealth visit, some criteria may be missing or patient reported. Any vitals not documented were not able to be obtained and vitals that have been documented are patient reported.  VideoDeclined- This patient declined Librarian, academic. Therefore the visit was completed with audio only.  Persons Participating in Visit: Patient.  AWV Questionnaire: No: Patient Medicare AWV questionnaire was not completed prior to this visit.  Cardiac Risk Factors include: advanced age (>53men, >96 women);diabetes mellitus;dyslipidemia;hypertension;obesity (BMI >30kg/m2);sedentary lifestyle     Objective:    Today's Vitals   09/26/24 1304  Weight: 212 lb (96.2 kg)  Height: 5' 7.75 (1.721 m)   Body mass index is 32.47 kg/m.     09/26/2024    1:11 PM 07/14/2023    2:39 PM 04/16/2022    1:41 PM 02/17/2018    1:34 PM 02/10/2018   10:07 AM 02/02/2018   11:17 AM 10/11/2017    9:58 AM  Advanced Directives  Does Patient Have a Medical Advance Directive? No No No No  No  No  No   Would patient like information on creating a medical advance directive?  No - Patient declined Yes (MAU/Ambulatory/Procedural Areas -  Information given) No - Patient declined    No - Patient declined      Data saved with a previous flowsheet row definition    Current Medications (verified) Outpatient Encounter Medications as of 09/26/2024  Medication Sig   aspirin 81 MG tablet Take 81 mg by mouth daily as needed (leg pain).   atorvastatin  (LIPITOR) 20 MG tablet Take 1 tablet by mouth once daily   Blood Glucose Monitoring Suppl (ONETOUCH VERIO) w/Device KIT Use to check blood sugar 3 times a day. E11.9   dapagliflozin  propanediol (FARXIGA ) 10 MG TABS tablet Take 1 tablet (10 mg total) by mouth daily.   gabapentin  (NEURONTIN ) 300 MG capsule Take 3 capsules (900 mg total) by mouth at bedtime. May take 1 capsule in the morning if needed   glucose blood (ONETOUCH VERIO) test strip USE 3x a day as recommended for diabetes type 2, insulin  dependent   ibuprofen  (ADVIL ) 800 MG tablet Take 800 mg by mouth every 6 (six) hours as needed.   Insulin  Pen Needle (BD PEN NEEDLE NANO 2ND GEN) 32G X 4 MM MISC USE 1 PEN NEEDLE AS DIRECTED TWICE DAILY   letrozole  (FEMARA ) 2.5 MG tablet Take 1 tablet (2.5 mg total) by mouth daily.   lisinopril -hydrochlorothiazide  (ZESTORETIC ) 20-25 MG tablet Take 1 tablet by mouth once daily   metFORMIN  (GLUCOPHAGE ) 1000 MG tablet Take 1 tablet (1,000 mg total) by mouth 2 (two) times daily with a meal.   NON FORMULARY Peripheral Neuropathy cream from [facility]   Omega-3 Fatty Acids (FISH OIL) 1200 MG CAPS Take 1,200 mg by mouth  daily.    TRESIBA  FLEXTOUCH 100 UNIT/ML FlexTouch Pen Inject 30 Units into the skin daily.   TRUEPLUS LANCETS 33G MISC 1 each by Does not apply route 3 (three) times daily.   No facility-administered encounter medications on file as of 09/26/2024.    Allergies (verified) Codeine   History: Past Medical History:  Diagnosis Date   Blood transfusion without reported diagnosis    Breast cancer (HCC) 06/2001   Invasive ductal carcinoma Left breast. 2002, 2017   Diabetes  mellitus without complication (HCC)    Difficult intravenous access    Family history of malignant neoplasm of ovary    Family history of pancreatic cancer    Hypertension    Past Surgical History:  Procedure Laterality Date   BREAST SURGERY Left 2003   COLONOSCOPY     FOOT SURGERY  2000   INSERTION OF MESH N/A 02/17/2018   Procedure: INSERTION OF MESH;  Surgeon: Belinda Cough, MD;  Location: [facility] OR;  Service: General;  Laterality: N/A;   MASTECTOMY Left 05/28/2016   port a cath insertion  05/28/2016   PORT-A-CATH REMOVAL Right 08/19/2017   Procedure: REMOVAL PORT-A-CATH;  Surgeon: Belinda Cough, MD;  Location: [facility];  Service: General;  Laterality: Right;   PORTACATH PLACEMENT Right 05/28/2016   Procedure: INSERTION PORT-A-CATH;  Surgeon: Cough Belinda, MD;  Location: [facility] OR;  Service: General;  Laterality: Right;   TOTAL MASTECTOMY Left 05/28/2016   Procedure: LEFT  MASTECTOMY;  Surgeon: Cough Belinda, MD;  Location: [facility] OR;  Service: General;  Laterality: Left;   TUBAL LIGATION  22 yrs since  2004.   Bilateral.   VENTRAL HERNIA REPAIR  02/17/2018   open   VENTRAL HERNIA REPAIR N/A 02/17/2018   Procedure: OPEN VENTRAL HERNIA REPAIR WITH MESH;  Surgeon: Belinda Cough, MD;  Location: [facility] OR;  Service: General;  Laterality: N/A;   Family History  Problem Relation Age of Onset   Pancreatic cancer Mother 51       deceased   Prostate cancer Father    Colon polyps Sister    Ovarian cancer Maternal Aunt 21       deceased   Cancer Maternal Aunt        2 other mat aunts; unk. primary in 29s; deceased   Cancer Maternal Uncle        bone ca; unk. primary; deceased 35s   Prostate cancer Maternal Uncle        deceased 15   Colon cancer Neg Hx    Esophageal cancer Neg Hx    Rectal cancer Neg Hx    Stomach cancer Neg Hx    Social History   Socioeconomic History   Marital status: Divorced    Spouse name: Not on file   Number of children: Not on file   Years of  education: Not on file   Highest education level: Not on file  Occupational History   Not on file  Tobacco Use   Smoking status: Former    Current packs/day: 0.00    Average packs/day: 0.1 packs/day for 7.0 years (0.7 ttl pk-yrs)    Types: Cigarettes    Start date: 11/30/1968    Quit date: 12/01/1975    Years since quitting: 48.8   Smokeless tobacco: Never  Vaping Use   Vaping status: Never Used  Substance and Sexual Activity   Alcohol use: No    Alcohol/week: 0.0 standard drinks of alcohol   Drug use: No   Sexual activity:  Yes  Other Topics Concern   Not on file  Social History Narrative   Not on file   Social Drivers of Health   Financial Resource Strain: Low Risk  (09/26/2024)   Overall Financial Resource Strain (CARDIA)    Difficulty of Paying Living Expenses: Not hard at all  Food Insecurity: No Food Insecurity (09/26/2024)   Hunger Vital Sign    Worried About Running Out of Food in the Last Year: Never true    Ran Out of Food in the Last Year: Never true  Transportation Needs: No Transportation Needs (09/26/2024)   PRAPARE - Administrator, Civil Service (Medical): No    Lack of Transportation (Non-Medical): No  Physical Activity: Insufficiently Active (09/26/2024)   Exercise Vital Sign    Days of Exercise per Week: 3 days    Minutes of Exercise per Session: 20 min  Stress: No Stress Concern Present (09/26/2024)   [employer] of Occupational Health - Occupational Stress Questionnaire    Feeling of Stress: Not at all  Social Connections: Moderately Integrated (09/26/2024)   Social Connection and Isolation Panel    Frequency of Communication with Friends and Family: More than three times a week    Frequency of Social Gatherings with Friends and Family: More than three times a week    Attends Religious Services: More than 4 times per year    Active Member of [employer] or Organizations: Yes    Attends Engineer, Structural: More than 4 times per  year    Marital Status: Divorced    Tobacco Counseling Counseling given: Not Answered   Clinical Intake:  Pre-visit preparation completed: Yes  Pain : No/denies pain     BMI - recorded: 32.47 Nutritional Status: BMI > 30  Obese Nutritional Risks: None Diabetes: Yes CBG done?: No Did pt. bring in CBG monitor from home?: No  Lab Results  Component Value Date   HGBA1C 7.8 (A) 08/03/2024   HGBA1C 7.1 (A) 03/07/2024   HGBA1C 7.5 (A) 09/02/2023     How often do you need to have someone help you when you read instructions, pamphlets, or other written materials from your doctor or pharmacy?: 1 - Never  Interpreter Needed?: No  Comments: daughter with pt Information entered by :: B.Terisha Losasso,LPN   Activities of Daily Living     09/26/2024    1:11 PM  In your present state of health, do you have any difficulty performing the following activities:  Hearing? 0  Vision? 0  Difficulty concentrating or making decisions? 0  Walking or climbing stairs? 0  Dressing or bathing? 0  Doing errands, shopping? 0  Preparing Food and eating ? N  Using the Toilet? N  In the past six months, have you accidently leaked urine? N  Do you have problems with loss of bowel control? N  Managing your Medications? N  Managing your Finances? N  Housekeeping or managing your Housekeeping? N    Patient Care Team: Tower, Laine LABOR, MD as PCP - General (Family Medicine) Gaynel Delon CROME, DPM as Consulting Physician (Podiatry) Trixie File, MD as Consulting Physician (Endocrinology) Burton, Lacie K, NP as Nurse Practitioner (Hematology and Oncology) [facility], Lonni Inks, MD as Consulting Physician (Ophthalmology)  I have updated your Care Teams any recent Medical Services you may have received from other providers in the past year.     Assessment:   This is a routine wellness examination for Lindsey Marquez.  Hearing/Vision screen Hearing Screening -  Comments:: Patient denies any hearing difficulties.   Vision Screening - Comments:: Pt says their vision is good without glasses;readers only [facility] w/visits   Goals Addressed             This Visit's Progress    Patient Stated   On track    09/26/24-Exercise 3-4 times a week for 30 minutes.       Depression Screen     09/26/2024    1:09 PM 02/17/2024   12:12 PM 09/07/2023   11:47 AM 07/14/2023    2:35 PM 04/16/2022    1:39 PM 12/25/2021   12:09 PM 04/15/2020   11:00 AM  PHQ 2/9 Scores  PHQ - 2 Score 0 0 0 0 0 0 0  PHQ- 9 Score  2 1        Fall Risk     09/26/2024    1:07 PM 02/17/2024   12:12 PM 09/07/2023   11:47 AM 07/14/2023    2:40 PM 04/16/2022    1:43 PM  Fall Risk   Falls in the past year? 0 1 1 0 0  Number falls in past yr: 0 0 0 0 0  Injury with Fall? 0 0 1 0 0  Risk for fall due to : No Fall Risks History of fall(s) History of fall(s) No Fall Risks No Fall Risks  Follow up Falls prevention discussed;Education provided Falls evaluation completed Falls evaluation completed Falls prevention discussed;Falls evaluation completed Falls prevention discussed      Data saved with a previous flowsheet row definition    MEDICARE RISK AT HOME:  Medicare Risk at Home Any stairs in or around the home?: Yes If so, are there any without handrails?: Yes Home free of loose throw rugs in walkways, pet beds, electrical cords, etc?: Yes Adequate lighting in your home to reduce risk of falls?: Yes Life alert?: No Use of a cane, walker or w/c?: No Grab bars in the bathroom?: No Shower chair or bench in shower?: No Elevated toilet seat or a handicapped toilet?: Yes  TIMED UP AND GO:  Was the test performed?  No  Cognitive Function: 6CIT completed        09/26/2024    1:14 PM 07/14/2023    2:42 PM  6CIT Screen  What Year? 0 points 0 points  What month? 0 points 0 points  What time? 0 points 0 points  Count back from 20 0 points 0 points  Months in  reverse 0 points 4 points  Repeat phrase 4 points 4 points  Total Score 4 points 8 points    Immunizations Immunization History  Administered Date(s) Administered   Fluad Quad(high Dose 65+) 08/03/2019, 08/29/2020, 08/14/2022   INFLUENZA, HIGH DOSE SEASONAL PF 09/14/2017, 09/03/2021, 08/19/2023   Influenza,inj,Quad PF,6+ Mos 09/07/2016, 09/29/2018   Influenza-Unspecified 10/31/2015   PFIZER(Purple Top)SARS-COV-2 Vaccination 01/25/2020, 02/20/2020, 10/09/2020   Pneumococcal Conjugate-13 10/04/2017   Pneumococcal Polysaccharide-23 01/27/2016   Td 12/26/2021   Tdap 09/08/2012   Unspecified SARS-COV-2 Vaccination 08/19/2023   Zoster Recombinant(Shingrix) 04/24/2020, 07/10/2020   Zoster, Live 09/08/2012    Screening Tests Health Maintenance  Topic Date Due   Influenza Vaccine  06/30/2024   COVID-19 Vaccine (5 - 2025-26 season) 07/31/2024   HEMOGLOBIN A1C  01/31/2025   FOOT EXAM  05/01/2025   OPHTHALMOLOGY EXAM  07/10/2025   Diabetic kidney evaluation - eGFR measurement  08/01/2025   Diabetic kidney evaluation - Urine ACR  08/03/2025   Mammogram  09/04/2025  Colonoscopy  09/23/2025   Medicare Annual Wellness (AWV)  09/26/2025   DTaP/Tdap/Td (3 - Td or Tdap) 12/27/2031   DEXA SCAN  Completed   Hepatitis C Screening  Completed   Zoster Vaccines- Shingrix  Completed   Meningococcal B Vaccine  Aged Out   Pneumococcal Vaccine: 50+ Years  Discontinued    Health Maintenance Items Addressed: Will get Influenza vaccine at PCP appt tomorrow;Covid vaccine from her local pharmacy when she decides to obtain.  Additional Screening:  Vision Screening: Recommended annual ophthalmology exams for early detection of glaucoma and other disorders of the eye. Is the patient up to date with their annual eye exam?  Yes  Who is the provider or what is the name of the office in which the patient attends annual eye exams? [facility]  Dental Screening: Recommended annual dental exams for proper  oral hygiene  Community Resource Referral / Chronic Care Management: CRR required this visit?  No   CCM required this visit?  No   Plan:    I have personally reviewed and noted the following in the patient's chart:   Medical and social history Use of alcohol, tobacco or illicit drugs  Current medications and supplements including opioid prescriptions. Patient is not currently taking opioid prescriptions. Functional ability and status Nutritional status Physical activity Advanced directives List of other physicians Hospitalizations, surgeries, and ER visits in previous 12 months Vitals Screenings to include cognitive, depression, and falls Referrals and appointments  In addition, I have reviewed and discussed with patient certain preventive protocols, quality metrics, and best practice recommendations. A written personalized care plan for preventive services as well as general preventive health recommendations were provided to patient.   Erminio LITTIE Saris, LPN   [id number]   After Visit Summary: (MyChart) Due to this being a telephonic visit, the after visit summary with patients personalized plan was offered to patient via MyChart   Notes: Nothing significant to report at this time.

## 2024-09-26 NOTE — Patient Instructions (Signed)
 Lindsey Marquez,  Thank you for taking the time for your Medicare Wellness Visit. I appreciate your continued commitment to your health goals. Please review the care plan we discussed, and feel free to reach out if I can assist you further.  Medicare recommends these wellness visits once per year to help you and your care team stay ahead of potential health issues. These visits are designed to focus on prevention, allowing your provider to concentrate on managing your acute and chronic conditions during your regular appointments.  Please note that Annual Wellness Visits do not include a physical exam. Some assessments may be limited, especially if the visit was conducted virtually. If needed, we may recommend a separate in-person follow-up with your provider.  Ongoing Care Seeing your primary care provider every 3 to 6 months helps us  monitor your health and provide consistent, personalized care.   Referrals If a referral was made during today's visit and you haven't received any updates within two weeks, please contact the referred provider directly to check on the status.  Recommended Screenings:  Health Maintenance  Topic Date Due   Flu Shot  06/30/2024   COVID-19 Vaccine (5 - 2025-26 season) 07/31/2024   Hemoglobin A1C  01/31/2025   Complete foot exam   05/01/2025   Eye exam for diabetics  07/10/2025   Yearly kidney function blood test for diabetes  08/01/2025   Yearly kidney health urinalysis for diabetes  08/03/2025   Breast Cancer Screening  09/04/2025   Colon Cancer Screening  09/23/2025   Medicare Annual Wellness Visit  09/26/2025   DTaP/Tdap/Td vaccine (3 - Td or Tdap) 12/27/2031   DEXA scan (bone density measurement)  Completed   Hepatitis C Screening  Completed   Zoster (Shingles) Vaccine  Completed   Meningitis B Vaccine  Aged Out   Pneumococcal Vaccine for age over 23  Discontinued       07/14/2023    2:39 PM  Advanced Directives  Does Patient Have a Medical Advance  Directive? No  Would patient like information on creating a medical advance directive? No - Patient declined   Advance Care Planning is important because it: Ensures you receive medical care that aligns with your values, goals, and preferences. Provides guidance to your family and loved ones, reducing the emotional burden of decision-making during critical moments.  Vision: Annual vision screenings are recommended for early detection of glaucoma, cataracts, and diabetic retinopathy. These exams can also reveal signs of chronic conditions such as diabetes and high blood pressure.  Dental: Annual dental screenings help detect early signs of oral cancer, gum disease, and other conditions linked to overall health, including heart disease and diabetes.

## 2024-09-26 NOTE — Telephone Encounter (Signed)
TB screen

## 2024-09-28 ENCOUNTER — Ambulatory Visit: Payer: Self-pay | Admitting: Family Medicine

## 2024-09-28 LAB — QUANTIFERON-TB GOLD PLUS
Mitogen-NIL: >10 [IU]/mL
NIL: 0.14 [IU]/mL
QuantiFERON-TB Gold Plus: NEGATIVE
TB1-NIL: <0 [IU]/mL
TB2-NIL: 0.01 [IU]/mL

## 2024-09-29 ENCOUNTER — Encounter: Payer: Self-pay | Admitting: Family Medicine

## 2024-09-29 ENCOUNTER — Ambulatory Visit: Admitting: Family Medicine

## 2024-09-29 VITALS — BP 136/70 | HR 90 | Temp 98.9°F | Ht 67.75 in | Wt 219.2 lb

## 2024-09-29 DIAGNOSIS — Z111 Encounter for screening for respiratory tuberculosis: Secondary | ICD-10-CM | POA: Diagnosis not present

## 2024-09-29 DIAGNOSIS — I10 Essential (primary) hypertension: Secondary | ICD-10-CM | POA: Diagnosis not present

## 2024-09-29 DIAGNOSIS — Z021 Encounter for pre-employment examination: Secondary | ICD-10-CM

## 2024-09-29 DIAGNOSIS — Z794 Long term (current) use of insulin: Secondary | ICD-10-CM

## 2024-09-29 NOTE — Progress Notes (Signed)
 Subjective:    Patient ID: Lindsey Marquez, female    DOB: Aug 27, 1951, 73 y.o.   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, community  Excited to start   No vision issues - wears glasses  Hears very well   Blood pressure in good control  DM stable in endocrinology care  No heart or pulm dx or limitations    Very busy 63 yo grand daughter is pregnant-helping her /planning a shower    Flu shot utd  Pna shot utd Td 11/2021  Had shingrix vaccines   Neg TB told test on 09/26/24   Patient Active Problem List   Diagnosis Date Noted   Encounter for pre-employment examination 09/29/2024   Screening-pulmonary TB 09/26/2024   Closed fracture of right distal fibula 08/25/2023   Closed fracture of base of fifth metatarsal bone 08/25/2023   Personal history of breast cancer 04/12/2023   Routine general medical examination at a health care facility 01/28/2023   Venous insufficiency 01/28/2023   Normocytic anemia 01/28/2023   Bilateral hip pain 07/17/2021   CKD (chronic kidney disease) stage 3, GFR 30-59 ml/min (HCC) 06/05/2021   Screening for malignant neoplasm of colon 02/17/2021   History of colonic polyps 11/18/2020   Drug-induced polyneuropathy 04/15/2020   Osteoarthritis 04/15/2020   Hyperlipidemia associated with type 2 diabetes mellitus (HCC) 09/20/2018   Class 1 obesity due to excess calories with body mass index (BMI) of 34.0 to 34.9 in adult 09/20/2018   Obesity (BMI 30.0-34.9) 05/18/2016   Cancer of central portion of left breast (HCC) 05/07/2016   HTN (hypertension) 03/24/2016   Type 2 diabetes mellitus with hyperglycemia, with long-term current use of insulin  (HCC) 03/24/2016    Past Medical History:  Diagnosis Date   Blood transfusion without reported diagnosis    Breast cancer (HCC) 06/2001   Invasive ductal carcinoma Left breast. 2002, 2017   Diabetes mellitus without complication (HCC)    Difficult intravenous access    Family history of malignant neoplasm of ovary    Family history of pancreatic cancer    Hypertension    Past Surgical History:  Procedure Laterality Date   BREAST SURGERY Left 2003   COLONOSCOPY     FOOT SURGERY  2000   INSERTION OF MESH N/A 02/17/2018   Procedure: INSERTION OF MESH;  Surgeon: Belinda Cough, MD;  Location: [facility] OR;  Service: General;  Laterality: N/A;   MASTECTOMY Left 05/28/2016   port a cath insertion  05/28/2016   PORT-A-CATH REMOVAL Right 08/19/2017   Procedure: REMOVAL PORT-A-CATH;  Surgeon: Belinda Cough, MD;  Location: [facility];  Service: General;  Laterality: Right;   PORTACATH PLACEMENT Right 05/28/2016   Procedure: INSERTION PORT-A-CATH;  Surgeon: Cough Belinda, MD;  Location[facility] OR;  Service: General;  Laterality: Right;   TOTAL MASTECTOMY Left 05/28/2016   Procedure: LEFT  MASTECTOMY;  Surgeon: Cough Belinda, MD;  Location: [facility] OR;  Service: General;  Laterality: Left;   TUBAL LIGATION  22 yrs since  2004.   Bilateral.   VENTRAL HERNIA REPAIR  02/17/2018   open  VENTRAL HERNIA REPAIR N/A 02/17/2018   Procedure: OPEN VENTRAL HERNIA REPAIR WITH MESH;  Surgeon: Belinda Cough, MD;  Location: [facility] OR;  Service: General;  Laterality: N/A;   Social History   Tobacco Use   Smoking status: Former    Current packs/day: 0.00    Average packs/day: 0.1 packs/day for 7.0 years (0.7 ttl pk-yrs)    Types: Cigarettes    Start date: 11/30/1968    Quit date: 12/01/1975    Years since quitting: 48.8   Smokeless tobacco: Never  Vaping Use   Vaping status: Never Used  Substance Use Topics   Alcohol use: No    Alcohol/week: 0.0 standard drinks of alcohol   Drug use: No   Family History  Problem Relation  Age of Onset   Pancreatic cancer Mother 24       deceased   Prostate cancer Father    Colon polyps Sister    Ovarian cancer Maternal Aunt 19       deceased   Cancer Maternal Aunt        2 other mat aunts; unk. primary in 53s; deceased   Cancer Maternal Uncle        bone ca; unk. primary; deceased 40s   Prostate cancer Maternal Uncle        deceased 19   Colon cancer Neg Hx    Esophageal cancer Neg Hx    Rectal cancer Neg Hx    Stomach cancer Neg Hx    Allergies  Allergen Reactions   Codeine Other (See Comments)    get crazy, see things that aren't there   Current Outpatient Medications on File Prior to Visit  Medication Sig Dispense Refill   aspirin 81 MG tablet Take 81 mg by mouth daily as needed (leg pain).     atorvastatin  (LIPITOR) 20 MG tablet Take 1 tablet by mouth once daily 90 tablet 0   Blood Glucose Monitoring Suppl (ONETOUCH VERIO) w/Device KIT Use to check blood sugar 3 times a day. E11.9 1 kit 0   dapagliflozin  propanediol (FARXIGA ) 10 MG TABS tablet Take 1 tablet (10 mg total) by mouth daily. 30 tablet 11   gabapentin  (NEURONTIN ) 300 MG capsule Take 3 capsules (900 mg total) by mouth at bedtime. May take 1 capsule in the morning if needed 120 capsule 2   glucose blood (ONETOUCH VERIO) test strip USE 3x a day as recommended for diabetes type 2, insulin  dependent 300 each 3   ibuprofen  (ADVIL ) 800 MG tablet Take 800 mg by mouth every 6 (six) hours as needed.     Insulin  Pen Needle (BD PEN NEEDLE NANO 2ND GEN) 32G X 4 MM MISC USE 1 PEN NEEDLE AS DIRECTED TWICE DAILY 100 each 5   letrozole  (FEMARA ) 2.5 MG tablet Take 1 tablet (2.5 mg total) by mouth daily. 30 tablet 12   lisinopril -hydrochlorothiazide  (ZESTORETIC ) 20-25 MG tablet Take 1 tablet by mouth once daily 90 tablet 2   metFORMIN  (GLUCOPHAGE ) 1000 MG tablet Take 1 tablet (1,000 mg total) by mouth 2 (two) times daily with a meal. 180 tablet 3   NON FORMULARY Peripheral Neuropathy cream from [facility]     Omega-3 Fatty Acids (FISH OIL) 1200 MG CAPS Take 1,200 mg by mouth daily.      TRESIBA  FLEXTOUCH 100 UNIT/ML FlexTouch Pen Inject 30 Units into the skin daily. 30 mL 3   TRUEPLUS LANCETS 33G MISC 1 each by Does not apply route 3 (three) times daily. 600 each 2  No current facility-administered medications on file prior to visit.    Review of Systems  Constitutional:  Negative for activity change, appetite change, fatigue, fever and unexpected weight change.  HENT:  Negative for congestion, ear pain, rhinorrhea, sinus pressure and sore throat.   Eyes:  Negative for pain, redness and visual disturbance.  Respiratory:  Negative for cough, shortness of breath and wheezing.   Cardiovascular:  Negative for chest pain and palpitations.  Gastrointestinal:  Negative for abdominal pain, blood in stool, constipation and diarrhea.  Endocrine: Negative for polydipsia and polyuria.  Genitourinary:  Negative for dysuria, frequency and urgency.  Musculoskeletal:  Negative for arthralgias, back pain and myalgias.  Skin:  Negative for pallor and rash.  Allergic/Immunologic: Negative for environmental allergies.  Neurological:  Negative for dizziness, syncope and headaches.  Hematological:  Negative for adenopathy. Does not bruise/bleed easily.  Psychiatric/Behavioral:  Negative for decreased concentration and dysphoric mood. The patient is not nervous/anxious.        Objective:   Physical Exam Constitutional:      General: She is not in acute distress.    Appearance: Normal appearance. She is well-developed. She is obese. She is not ill-appearing or diaphoretic.  HENT:     Head: Normocephalic and atraumatic.  Eyes:     Conjunctiva/sclera: Conjunctivae normal.     Pupils: Pupils are equal, round, and reactive to light.  Neck:     Thyroid : No thyromegaly.     Vascular: No carotid bruit or JVD.  Cardiovascular:     Rate and Rhythm: Normal rate and regular rhythm.     Heart sounds:  Normal heart sounds.     No gallop.  Pulmonary:     Effort: Pulmonary effort is normal. No respiratory distress.     Breath sounds: Normal breath sounds. No wheezing or rales.  Abdominal:     General: There is no distension or abdominal bruit.     Palpations: Abdomen is soft.  Musculoskeletal:     Cervical back: Normal range of motion and neck supple.     Right lower leg: No edema.     Left lower leg: No edema.  Lymphadenopathy:     Cervical: No cervical adenopathy.  Skin:    General: Skin is warm and dry.     Coloration: Skin is not pale.     Findings: No rash.  Neurological:     Mental Status: She is alert.     Coordination: Coordination normal.     Deep Tendon Reflexes: Reflexes are normal and symmetric. Reflexes normal.  Psychiatric:        Mood and Affect: Mood normal.           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

## 2024-09-29 NOTE — Patient Instructions (Addendum)
 No restrictions for work  Take care of yourself   TB screening is negative   Stay active  Hydrate well especially during work  Keep an eye on your blood pressure

## 2024-09-30 NOTE — Assessment & Plan Note (Signed)
 bp in fair control at this time  BP Readings from Last 1 Encounters:  09/29/24 136/70   No changes needed Most recent labs reviewed  Disc lifstyle change with low sodium diet and exercise  Plan to continue lisinoprol hct 20-25 mg once daily

## 2024-09-30 NOTE — Assessment & Plan Note (Signed)
 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

## 2024-09-30 NOTE — Assessment & Plan Note (Signed)
 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

## 2024-10-02 ENCOUNTER — Encounter: Payer: Self-pay | Admitting: Radiology

## 2024-10-12 ENCOUNTER — Other Ambulatory Visit: Payer: Self-pay | Admitting: Internal Medicine

## 2024-10-12 ENCOUNTER — Telehealth: Payer: Self-pay | Admitting: Internal Medicine

## 2024-10-12 DIAGNOSIS — E1165 Type 2 diabetes mellitus with hyperglycemia: Secondary | ICD-10-CM

## 2024-10-12 MED ORDER — INSULIN GLARGINE 100 UNIT/ML SOLOSTAR PEN: 30.0000 [IU] | PEN_INJECTOR | Freq: Every day | SUBCUTANEOUS | 2 refills | Status: AC

## 2024-10-12 NOTE — Telephone Encounter (Signed)
 Patient is calling to say that her insurance will no longer cover TRESIBA  FLEXTOUCH 100 UNIT/ML FlexTouch Pen starting next year.  Patient would like to know what should she do.  Patient has [employer] and [employer]/product [employer].

## 2024-11-02 ENCOUNTER — Ambulatory Visit: Admitting: Internal Medicine

## 2024-11-07 ENCOUNTER — Ambulatory Visit: Admitting: Internal Medicine

## 2024-11-13 ENCOUNTER — Other Ambulatory Visit: Payer: Self-pay | Admitting: Internal Medicine

## 2024-11-13 DIAGNOSIS — E1165 Type 2 diabetes mellitus with hyperglycemia: Secondary | ICD-10-CM

## 2024-12-08 ENCOUNTER — Other Ambulatory Visit: Payer: Self-pay | Admitting: Internal Medicine

## 2024-12-08 ENCOUNTER — Ambulatory Visit (INDEPENDENT_AMBULATORY_CARE_PROVIDER_SITE_OTHER): Admitting: Internal Medicine

## 2024-12-08 ENCOUNTER — Ambulatory Visit: Admitting: Internal Medicine

## 2024-12-08 ENCOUNTER — Encounter: Payer: Self-pay | Admitting: Internal Medicine

## 2024-12-08 VITALS — BP 120/70 | HR 96 | Ht 67.75 in | Wt 216.2 lb

## 2024-12-08 DIAGNOSIS — Z794 Long term (current) use of insulin: Secondary | ICD-10-CM

## 2024-12-08 DIAGNOSIS — E785 Hyperlipidemia, unspecified: Secondary | ICD-10-CM | POA: Diagnosis not present

## 2024-12-08 DIAGNOSIS — E1165 Type 2 diabetes mellitus with hyperglycemia: Secondary | ICD-10-CM

## 2024-12-08 DIAGNOSIS — E1169 Type 2 diabetes mellitus with other specified complication: Secondary | ICD-10-CM | POA: Diagnosis not present

## 2024-12-08 DIAGNOSIS — E66811 Obesity, class 1: Secondary | ICD-10-CM | POA: Diagnosis not present

## 2024-12-08 MED ORDER — ACCU-CHEK GUIDE W/DEVICE KIT: PACK | 0 refills | Status: DC

## 2024-12-08 MED ORDER — ATORVASTATIN CALCIUM 20 MG PO TABS: 20.0000 mg | ORAL_TABLET | Freq: Every day | ORAL | 3 refills | Status: AC

## 2024-12-08 MED ORDER — ACCU-CHEK SOFTCLIX LANCETS MISC: 3 refills | Status: AC

## 2024-12-08 MED ORDER — GLUCOSE BLOOD VI STRP: ORAL_STRIP | 3 refills | Status: AC

## 2024-12-08 NOTE — Patient Instructions (Addendum)
 Please continue: - Metformin  1000 mg 2x a day with meals - Farxiga  10 mg daily before b'fast - Tresiba  30 units at bedtime  Please stop at the lab.  Please start checking blood sugars 1x a day, rotating check times.  Please come back for a follow-up appointment in 3 months.

## 2024-12-08 NOTE — Progress Notes (Signed)
 Patient ID: Lindsey Marquez, female   DOB: Jan 16, 1951, 74 y.o.   MRN: [id number]   HPI: Lindsey Marquez is a 74 y.o.-year-old female, returning for follow-up for DM2, dx in ~2001, insulin -dependent, uncontrolled, without long-term complications. Last visit 4 months ago.  Interim history: No increased urination, blurry vision, nausea, chest pain. She recently restarted working. She is quite busy with both working and family -daughter just had a baby.  Reviewed HbA1c levels: Lab Results  Component Value Date   HGBA1C 7.8 (A) 08/03/2024   HGBA1C 7.1 (A) 03/07/2024   HGBA1C 7.5 (A) 09/02/2023   HGBA1C 7.0 (A) 02/17/2023   HGBA1C 6.2 (A) 01/19/2022   HGBA1C 6.6 (A) 09/17/2021   HGBA1C 6.9 (A) 06/12/2021   HGBA1C 9.1 (A) 03/04/2021   HGBA1C 7.9 (A) 10/28/2020   HGBA1C 8.4 (H) 04/15/2020   HGBA1C 10.4 (A) 10/04/2019   HGBA1C 9.8 (H) 12/20/2018   HGBA1C 9.4 (A) 09/20/2018   HGBA1C 8.8 (H) 04/05/2018   HGBA1C 7.5 (H) 02/10/2018   HGBA1C 7.4 (H) 01/04/2018   HGBA1C 8.0 (H) 10/04/2017   HGBA1C 6.9 (H) 12/28/2016   HGBA1C 8.0 (H) 09/24/2016   HGBA1C 7.8 (H) 05/26/2016  09/02/2023: HbA1c calculated from fructosamine is 6.47%, lower than the directly measured HbA1c.  Pt is on: - Basaglar  20 >> 30 >> 34-36 >> 20 >> Tresiba  30 units at bedtime >> changed to Lantus  per insurance preference - Metformin  1000 mg 2x a day with meals - Farxiga  10 mg daily before b'fast  GLP1 R agonists were not affordable.  Pt did not check sugars recently - from before: - am  93-136, 149, 154 >> 113-124, 135, 148, 199 >> n/c >> 120-140 - 2h after b'fast: n/c >> 128-141 - before lunch:  90s-101 >> n/c >> 93-114 >> 103-155, 188 >> n/c - 2h after lunch: 85, 99-158 >> 172, 179 >> 200 >> n/c - before dinner: 180-195 >> 200s >> 103-145, 174 >> n/c  - 2h after dinner: 100-194 >> 104-160 >> 111, 131 >> 140s - bedtime:  115-137, 182 >> 92-124, 146 >> n/c - nighttime: n/c Lowest sugar was 85 >> 93 >> 103 >> 111; she  has hypoglycemia awareness in the 70s. Highest sugar was 200 >> 141 >> 140s  Glucometer: True metrix >> One Touch Verio  Pt's meals are: - Breakfast:coffee, mc muffin, egg - Lunch: turkey sandwich - Dinner: baked chicken, veggies,dessert - apples, - Snacks: yoghurt, pretzels, PB  - + Mild CKD, last BUN/creatinine:  Lab Results  Component Value Date   BUN 20 08/01/2024   BUN 23 03/21/2024   CREATININE 0.98 08/01/2024   CREATININE 0.98 03/21/2024   Lab Results  Component Value Date   MICRALBCREAT 45 (H) 08/03/2024   MICRALBCREAT 35 (H) 03/09/2024  On lisinopril  10.  -+ HL; last set of lipids: Lab Results  Component Value Date   CHOL 125 02/17/2024   HDL 50.90 02/17/2024   LDLCALC 41 02/17/2024   LDLDIRECT 113.0 04/15/2020   TRIG 165.0 (H) 02/17/2024   CHOLHDL 2 02/17/2024  On Lipitor 10 >> 20 mg, fish oil.  - last eye exam was on 07/10/2024: No DR.  - no numbness and tingling in her feet.  Last foot exam was per Dr. Gaynel 09/07/2024.  She fractured the fifth L metatarsal in 08/2023.   Pt has FH of DM in brother, sister, father.  + h/o BrCA in 2013, recurrence in 2018.  She is cancer free.   She has L  leg swelling - chronic, after ChTx for BrCA.  On letrozole .  ROS: + See HPI  Current Outpatient Medications on File Prior to Visit  Medication Sig   aspirin 81 MG tablet Take 81 mg by mouth daily as needed (leg pain).   atorvastatin  (LIPITOR) 20 MG tablet Take 1 tablet by mouth once daily   Blood Glucose Monitoring Suppl (ONETOUCH VERIO) w/Device KIT Use to check blood sugar 3 times a day. E11.9   dapagliflozin  propanediol (FARXIGA ) 10 MG TABS tablet Take 1 tablet (10 mg total) by mouth daily.   gabapentin  (NEURONTIN ) 300 MG capsule Take 3 capsules (900 mg total) by mouth at bedtime. May take 1 capsule in the morning if needed   glucose blood (ONETOUCH VERIO) test strip USE TO TEST BLOOD SUGAR THREE TIMES DAILY   ibuprofen  (ADVIL ) 800 MG tablet Take 800 mg by mouth  every 6 (six) hours as needed.   insulin  glargine (LANTUS ) 100 UNIT/ML Solostar Pen Inject 30 Units into the skin daily.   Insulin  Pen Needle (BD PEN NEEDLE NANO 2ND GEN) 32G X 4 MM MISC USE 1 PEN NEEDLE AS DIRECTED TWICE DAILY   letrozole  (FEMARA ) 2.5 MG tablet Take 1 tablet (2.5 mg total) by mouth daily.   lisinopril -hydrochlorothiazide  (ZESTORETIC ) 20-25 MG tablet Take 1 tablet by mouth once daily   metFORMIN  (GLUCOPHAGE ) 1000 MG tablet Take 1 tablet (1,000 mg total) by mouth 2 (two) times daily with a meal.   NON FORMULARY Peripheral Neuropathy cream from [facility]   Omega-3 Fatty Acids (FISH OIL) 1200 MG CAPS Take 1,200 mg by mouth daily.    TRUEPLUS LANCETS 33G MISC 1 each by Does not apply route 3 (three) times daily.   No current facility-administered medications on file prior to visit.   Past Medical History:  Diagnosis Date   Blood transfusion without reported diagnosis    Breast cancer (HCC) 06/2001   Invasive ductal carcinoma Left breast. 2002, 2017   Diabetes mellitus without complication (HCC)    Difficult intravenous access    Family history of malignant neoplasm of ovary    Family history of pancreatic cancer    Hypertension    Past Surgical History:  Procedure Laterality Date   BREAST SURGERY Left 2003   COLONOSCOPY     FOOT SURGERY  2000   INSERTION OF MESH N/A 02/17/2018   Procedure: INSERTION OF MESH;  Surgeon: Belinda Cough, MD;  Location: [facility] OR;  Service: General;  Laterality: N/A;   MASTECTOMY Left 05/28/2016   port a cath insertion  05/28/2016   PORT-A-CATH REMOVAL Right 08/19/2017   Procedure: REMOVAL PORT-A-CATH;  Surgeon: Belinda Cough, MD;  Location: [facility];  Service: General;  Laterality: Right;   PORTACATH PLACEMENT Right 05/28/2016   Procedure: INSERTION PORT-A-CATH;  Surgeon: Cough Belinda, MD;  Location: [facility] OR;  Service: General;  Laterality: Right;   TOTAL MASTECTOMY Left 05/28/2016   Procedure: LEFT  MASTECTOMY;   Surgeon: Cough Belinda, MD;  Location: [facility] OR;  Service: General;  Laterality: Left;   TUBAL LIGATION  22 yrs since  2004.   Bilateral.   VENTRAL HERNIA REPAIR  02/17/2018   open   VENTRAL HERNIA REPAIR N/A 02/17/2018   Procedure: OPEN VENTRAL HERNIA REPAIR WITH MESH;  Surgeon: Belinda Cough, MD;  Location: [facility] OR;  Service: General;  Laterality: N/A;   Social History   Socioeconomic History   Marital status: Divorced    Spouse name: Not on file   Number of children:  Not on file   Years of education: Not on file   Highest education level: Not on file  Occupational History   Not on file  Tobacco Use   Smoking status: Former    Current packs/day: 0.00    Average packs/day: 0.1 packs/day for 7.0 years (0.7 ttl pk-yrs)    Types: Cigarettes    Start date: 11/30/1968    Quit date: 12/01/1975    Years since quitting: 49.0   Smokeless tobacco: Never  Vaping Use   Vaping status: Never Used  Substance and Sexual Activity   Alcohol use: No    Alcohol/week: 0.0 standard drinks of alcohol   Drug use: No   Sexual activity: Yes  Other Topics Concern   Not on file  Social History Narrative   Not on file   Social Drivers of Health   Tobacco Use: Medium Risk (09/29/2024)   Patient History    Smoking Tobacco Use: Former    Smokeless Tobacco Use: Never    Passive Exposure: Not on Actuary Strain: Low Risk (09/26/2024)   Overall Financial Resource Strain (CARDIA)    Difficulty of Paying Living Expenses: Not hard at all  Food Insecurity: No Food Insecurity (09/26/2024)   Epic    Worried About Radiation Protection Practitioner of Food in the Last Year: Never true    Ran Out of Food in the Last Year: Never true  Transportation Needs: No Transportation Needs (09/26/2024)   Epic    Lack of Transportation (Medical): No    Lack of Transportation (Non-Medical): No  Physical Activity: Insufficiently Active (09/26/2024)   Exercise Vital Sign    Days of Exercise per Week: 3 days    Minutes of  Exercise per Session: 20 min  Stress: No Stress Concern Present (09/26/2024)   [employer] of Occupational Health - Occupational Stress Questionnaire    Feeling of Stress: Not at all  Social Connections: Moderately Integrated (09/26/2024)   Social Connection and Isolation Panel    Frequency of Communication with Friends and Family: More than three times a week    Frequency of Social Gatherings with Friends and Family: More than three times a week    Attends Religious Services: More than 4 times per year    Active Member of Clubs or Organizations: Yes    Attends Banker Meetings: More than 4 times per year    Marital Status: Divorced  Intimate Partner Violence: Not At Risk (09/26/2024)   Epic    Fear of Current or Ex-Partner: No    Emotionally Abused: No    Physically Abused: No    Sexually Abused: No  Depression (PHQ2-9): Low Risk (09/29/2024)   Depression (PHQ2-9)    PHQ-2 Score: 2  Alcohol Screen: Low Risk (09/26/2024)   Alcohol Screen    Last Alcohol Screening Score (AUDIT): 0  Housing: Unknown (09/26/2024)   Epic    Unable to Pay for Housing in the Last Year: No    Number of Times Moved in the Last Year: Not on file    Homeless in the Last Year: No  Utilities: Not At Risk (09/26/2024)   Epic    Threatened with loss of utilities: No  Health Literacy: Adequate Health Literacy (09/26/2024)   B1300 Health Literacy    Frequency of need for help with medical instructions: Never    Allergies  Allergen Reactions   Codeine Other (See Comments)    get crazy, see things that aren't there   Family History  Problem Relation Age of Onset   Pancreatic cancer Mother 30       deceased   Prostate cancer Father    Colon polyps Sister    Ovarian cancer Maternal Aunt 9       deceased   Cancer Maternal Aunt        2 other mat aunts; unk. primary in 28s; deceased   Cancer Maternal Uncle        bone ca; unk. primary; deceased 35s   Prostate cancer Maternal  Uncle        deceased 26   Colon cancer Neg Hx    Esophageal cancer Neg Hx    Rectal cancer Neg Hx    Stomach cancer Neg Hx    PE:  BP 120/70   Pulse 96   Ht 5' 7.75 (1.721 m)   Wt 216 lb 3.2 oz (98.1 kg)   SpO2 98%   BMI 33.12 kg/m  Wt Readings from Last 10 Encounters:  12/08/24 216 lb 3.2 oz (98.1 kg)  09/29/24 219 lb 4 oz (99.5 kg)  09/26/24 212 lb (96.2 kg)  08/03/24 212 lb 6.4 oz (96.3 kg)  08/01/24 215 lb 7 oz (97.7 kg)  05/01/24 220 lb 6.4 oz (100 kg)  03/07/24 220 lb 6.4 oz (100 kg)  02/17/24 223 lb 2 oz (101.2 kg)  01/24/24 222 lb 2.1 oz (100.8 kg)  10/14/23 222 lb 2.1 oz (100.8 kg)   Constitutional: overweight, in NAD Eyes:  EOMI, no exophthalmos ENT: no neck masses, no cervical lymphadenopathy Cardiovascular: Tachycardia, RR, No MRG Respiratory: CTA B Musculoskeletal: no deformities Skin:no rashes Neurological: no tremor with outstretched hands  ASSESSMENT: 1. DM2, insulin -dependent, uncontrolled, without long-term complications, but with hyperglycemia  2. HL  3.  Obesity class I  PLAN:  1. Patient with longstanding, uncontrolled, type 2 diabetes, on oral antidiabetic regimen with metformin , SGLT2 inhibitor and long-acting insulin , with worsening control at last visit.  At that time, HbA1c increased from 7.1% to 7.8%.  Sugars appears to be excellent but she was not taking consistently later in the day.  We discussed about rotating check times.  The HbA1c appears to be higher than expected from her log though. - She has previously  sipping on protein shakes throughout the day.  I recommended to stop.   -At today's visit, she is not checking blood sugars after she misplaced her meter.  She also mentions that her test trips are expensive.  At today's visit I called in a prescription for an Accu-Chek guide meter and supplies to her pharmacy.  This may be cheaper to her if covered by insurance.  Her sugars did not increase significantly during the holidays,  judging by the HbA1c (see below), but it is difficult to adjust the regimen without blood sugars.  We discussed about the possibility of starting a CGM, but she would not want to try this.  Will have her start checking blood sugars consistently and adjust the regimen at next visit.  In the meantime, we will have her work on her diet. - I suggested to: Patient Instructions  Please continue: - Metformin  1000 mg 2x a day with meals - Farxiga  10 mg daily before b'fast - Tresiba  30 units at bedtime  Please stop at the lab.  Please start checking blood sugars 1x a day, rotating check times.  Please come back for a follow-up appointment in 3 months.  - we checked her HbA1c: 7.9% (slightly higher) - advised to check sugars  at different times of the day - 1x a day, rotating check times - advised for yearly eye exams >> she is UTD -ACR was slightly elevated at the last 2 checks, with the latest being 45 at last visit.  She continues on lisinopril  10 mg daily and Farxiga .  Will recheck this today.  She may need a referral to nephrology if this continues to increase, especially if >300. - return to clinic in 3 months  2. HL - Her lipid fractions were at goal at last check: Lab Results  Component Value Date   CHOL 125 02/17/2024   HDL 50.90 02/17/2024   LDLCALC 41 02/17/2024   LDLDIRECT 113.0 04/15/2020   TRIG 165.0 (H) 02/17/2024   CHOLHDL 2 02/17/2024  - She continues on Lipitor 20 mg daily without side effects.  She is also on fish oil 1200 mg daily.  3.  Obesity class I - Will continue Farxiga  which should also help with weight loss - She lost 8 pounds before last visit and gained 4 pounds since then  Lela Fendt, MD PhD [facility]

## 2024-12-11 ENCOUNTER — Ambulatory Visit: Admitting: Podiatry

## 2024-12-11 ENCOUNTER — Encounter: Payer: Self-pay | Admitting: Podiatry

## 2024-12-11 DIAGNOSIS — G620 Drug-induced polyneuropathy: Secondary | ICD-10-CM | POA: Diagnosis not present

## 2024-12-11 DIAGNOSIS — B351 Tinea unguium: Secondary | ICD-10-CM | POA: Diagnosis not present

## 2024-12-11 DIAGNOSIS — T451X5A Adverse effect of antineoplastic and immunosuppressive drugs, initial encounter: Secondary | ICD-10-CM | POA: Diagnosis not present

## 2024-12-11 DIAGNOSIS — M79674 Pain in right toe(s): Secondary | ICD-10-CM | POA: Diagnosis not present

## 2024-12-11 DIAGNOSIS — Z794 Long term (current) use of insulin: Secondary | ICD-10-CM | POA: Diagnosis not present

## 2024-12-11 DIAGNOSIS — L84 Corns and callosities: Secondary | ICD-10-CM | POA: Diagnosis not present

## 2024-12-11 DIAGNOSIS — M79675 Pain in left toe(s): Secondary | ICD-10-CM

## 2024-12-11 DIAGNOSIS — E1165 Type 2 diabetes mellitus with hyperglycemia: Secondary | ICD-10-CM | POA: Diagnosis not present

## 2024-12-18 NOTE — Progress Notes (Signed)
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 Painful toenails interfere with ambulation. Aggravating factors include wearing enclosed shoe gear. Pain is relieved with periodic professional debridement. Painful corns are aggravated when weightbearing when wearing enclosed shoe gear. Pain is relieved with periodic professional debridement.  Chief Complaint  Patient presents with   Diabetes    A1c 7.8 She saw Dr. Randeen in Oct   New problem(s): None.   PCP is Tower, Laine LABOR, MD.  Allergies[1]  Review of Systems: Negative except as noted in the HPI.  Objective:  There were no vitals filed for this visit. Lindsey Marquez is a pleasant 74 y.o. female in NAD. AAO x 3.  Vascular Examination: Capillary refill time immediate b/l. Palpable pedal pulses. Pedal hair present b/l. Pedal edema absent. No pain with calf compression b/l. Skin temperature gradient WNL b/l. No cyanosis or clubbing b/l. No ischemia or gangrene noted b/l.   Neurological Examination: Sensation grossly intact b/l with 10 gram monofilament. Vibratory sensation intact b/l. Pt has subjective symptoms of neuropathy.  Dermatological Examination: Pedal skin with normal turgor, texture and tone b/l.  No open wounds. No interdigital macerations.   Toenails 1-5 b/l thick, discolored, elongated with subungual debris and pain on dorsal palpation.   Hyperkeratotic lesion(s) dorsal PIPJ left 5th toe.  No erythema, no edema, no drainage, no fluctuance.  Musculoskeletal Examination: Muscle strength 5/5 to all lower extremity muscle groups bilaterally. HAV with bunion deformity noted b/l LE. Hammertoe(s) 1-5 b/l.Lindsey Marquez No pain, crepitus or joint limitation noted with ROM b/l LE.  Patient ambulates  independently without assistive aids.  Radiographs: None  Assessment/Plan: 1. Pain due to onychomycosis of toenails of both feet   2. Corns   3. Chemotherapy-induced neuropathy   4. Type 2 diabetes mellitus with hyperglycemia, with long-term current use of insulin  [facility])   Patient was evaluated and treated. All patient's and/or POA's questions/concerns addressed on today's visit. Toenails 1-5 b/l debrided in length and girth without incident. Corn(s) dorsal PIPJ left 5th toe pared with sharp debridement without incident. Continue foot and shoe inspections daily. Monitor blood glucose per PCP/Endocrinologist's recommendations. Continue soft, supportive shoe gear daily. Report any pedal injuries to medical professional. Call office if there are any questions/concerns. -Patient/POA to call should there be question/concern in the interim.   Return in about 3 months (around 03/11/2025).  Lindsey Marquez, DPM      [place] LOCATION: [address] [place] [address]                   Office [phone]   [place] LOCATION: [address] [place], [place] [address] Office [phone]     [1]  Allergies Allergen Reactions   Codeine Other (See Comments)    get crazy, see things that aren't there   "

## 2025-03-01 ENCOUNTER — Ambulatory Visit: Admitting: Internal Medicine

## 2025-03-12 ENCOUNTER — Ambulatory Visit: Admitting: Podiatry

## 2025-08-07 ENCOUNTER — Other Ambulatory Visit

## 2025-08-07 ENCOUNTER — Ambulatory Visit: Admitting: Nurse Practitioner

## 2025-09-28 ENCOUNTER — Ambulatory Visit
